# Patient Record
Sex: Male | Born: 1945
Health system: Southern US, Community
[De-identification: ages and names within clinical notes are randomized; demographics above are authoritative.]

## PROBLEM LIST (undated history)

## (undated) DIAGNOSIS — C801 Malignant (primary) neoplasm, unspecified: Secondary | ICD-10-CM

## (undated) DIAGNOSIS — Z860100 Personal history of colon polyps, unspecified: Secondary | ICD-10-CM

## (undated) DIAGNOSIS — J449 Chronic obstructive pulmonary disease, unspecified: Secondary | ICD-10-CM

## (undated) DIAGNOSIS — I219 Acute myocardial infarction, unspecified: Secondary | ICD-10-CM

## (undated) DIAGNOSIS — I779 Disorder of arteries and arterioles, unspecified: Secondary | ICD-10-CM

## (undated) DIAGNOSIS — I4819 Other persistent atrial fibrillation: Secondary | ICD-10-CM

## (undated) DIAGNOSIS — G8929 Other chronic pain: Secondary | ICD-10-CM

## (undated) DIAGNOSIS — F32A Depression, unspecified: Secondary | ICD-10-CM

## (undated) DIAGNOSIS — Z9001 Acquired absence of eye: Secondary | ICD-10-CM

## (undated) DIAGNOSIS — M5104 Intervertebral disc disorders with myelopathy, thoracic region: Secondary | ICD-10-CM

## (undated) DIAGNOSIS — H9190 Unspecified hearing loss, unspecified ear: Secondary | ICD-10-CM

## (undated) DIAGNOSIS — M549 Dorsalgia, unspecified: Secondary | ICD-10-CM

## (undated) DIAGNOSIS — J849 Interstitial pulmonary disease, unspecified: Secondary | ICD-10-CM

## (undated) DIAGNOSIS — I10 Essential (primary) hypertension: Secondary | ICD-10-CM

## (undated) DIAGNOSIS — R911 Solitary pulmonary nodule: Secondary | ICD-10-CM

## (undated) DIAGNOSIS — I4891 Unspecified atrial fibrillation: Secondary | ICD-10-CM

## (undated) DIAGNOSIS — R0600 Dyspnea, unspecified: Secondary | ICD-10-CM

## (undated) DIAGNOSIS — I5032 Chronic diastolic (congestive) heart failure: Secondary | ICD-10-CM

## (undated) DIAGNOSIS — C679 Malignant neoplasm of bladder, unspecified: Secondary | ICD-10-CM

## (undated) DIAGNOSIS — D649 Anemia, unspecified: Secondary | ICD-10-CM

## (undated) DIAGNOSIS — R0989 Other specified symptoms and signs involving the circulatory and respiratory systems: Secondary | ICD-10-CM

## (undated) DIAGNOSIS — E785 Hyperlipidemia, unspecified: Secondary | ICD-10-CM

## (undated) DIAGNOSIS — I499 Cardiac arrhythmia, unspecified: Secondary | ICD-10-CM

## (undated) DIAGNOSIS — M1990 Unspecified osteoarthritis, unspecified site: Secondary | ICD-10-CM

## (undated) DIAGNOSIS — I2510 Atherosclerotic heart disease of native coronary artery without angina pectoris: Secondary | ICD-10-CM

## (undated) DIAGNOSIS — N189 Chronic kidney disease, unspecified: Secondary | ICD-10-CM

## (undated) DIAGNOSIS — E119 Type 2 diabetes mellitus without complications: Secondary | ICD-10-CM

## (undated) DIAGNOSIS — N186 End stage renal disease: Secondary | ICD-10-CM

## (undated) DIAGNOSIS — Z86718 Personal history of other venous thrombosis and embolism: Secondary | ICD-10-CM

## (undated) DIAGNOSIS — I739 Peripheral vascular disease, unspecified: Secondary | ICD-10-CM

## (undated) HISTORY — DX: Malignant (primary) neoplasm, unspecified: C80.1

## (undated) HISTORY — PX: HERNIA REPAIR: SHX51

## (undated) HISTORY — DX: Personal history of colonic polyps: Z86.010

## (undated) HISTORY — DX: Essential (primary) hypertension: I10

## (undated) HISTORY — DX: Chronic obstructive pulmonary disease, unspecified: J44.9

## (undated) HISTORY — PX: COLON RESECTION: SHX5231

## (undated) HISTORY — DX: Unspecified osteoarthritis, unspecified site: M19.90

## (undated) HISTORY — DX: Unspecified atrial fibrillation: I48.91

## (undated) HISTORY — DX: Hyperlipidemia, unspecified: E78.5

## (undated) HISTORY — DX: Other specified symptoms and signs involving the circulatory and respiratory systems: R09.89

## (undated) HISTORY — PX: BACK SURGERY: SHX140

## (undated) HISTORY — PX: EYE SURGERY: SHX253

## (undated) HISTORY — DX: Atherosclerotic heart disease of native coronary artery without angina pectoris: I25.10

## (undated) HISTORY — DX: Other chronic pain: G89.29

## (undated) HISTORY — DX: Personal history of colon polyps, unspecified: Z86.0100

## (undated) HISTORY — DX: Intervertebral disc disorders with myelopathy, thoracic region: M51.04

## (undated) HISTORY — DX: Dorsalgia, unspecified: M54.9

## (undated) HISTORY — DX: Peripheral vascular disease, unspecified: I73.9

## (undated) HISTORY — DX: Interstitial pulmonary disease, unspecified: J84.9

## (undated) HISTORY — DX: Acquired absence of eye: Z90.01

---

## 1997-11-29 ENCOUNTER — Ambulatory Visit (HOSPITAL_COMMUNITY): Admission: RE | Admit: 1997-11-29 | Discharge: 1997-11-29 | Payer: Self-pay | Admitting: Neurosurgery

## 1998-09-19 ENCOUNTER — Encounter: Payer: Self-pay | Admitting: *Deleted

## 1998-09-20 ENCOUNTER — Observation Stay (HOSPITAL_COMMUNITY): Admission: RE | Admit: 1998-09-20 | Discharge: 1998-09-21 | Payer: Self-pay | Admitting: *Deleted

## 1999-05-02 ENCOUNTER — Observation Stay (HOSPITAL_COMMUNITY): Admission: EM | Admit: 1999-05-02 | Discharge: 1999-05-03 | Payer: Self-pay | Admitting: Pediatrics

## 1999-05-02 ENCOUNTER — Encounter (INDEPENDENT_AMBULATORY_CARE_PROVIDER_SITE_OTHER): Payer: Self-pay | Admitting: Specialist

## 1999-10-22 ENCOUNTER — Encounter: Payer: Self-pay | Admitting: *Deleted

## 1999-10-24 ENCOUNTER — Encounter (INDEPENDENT_AMBULATORY_CARE_PROVIDER_SITE_OTHER): Payer: Self-pay | Admitting: Specialist

## 1999-10-24 ENCOUNTER — Ambulatory Visit (HOSPITAL_COMMUNITY): Admission: RE | Admit: 1999-10-24 | Discharge: 1999-10-24 | Payer: Self-pay | Admitting: *Deleted

## 1999-10-24 HISTORY — PX: TRANSURETHRAL RESECTION OF BLADDER TUMOR: SHX2575

## 2000-11-04 ENCOUNTER — Ambulatory Visit (HOSPITAL_COMMUNITY): Admission: RE | Admit: 2000-11-04 | Discharge: 2000-11-04 | Payer: Self-pay | Admitting: Family Medicine

## 2000-11-04 ENCOUNTER — Encounter: Payer: Self-pay | Admitting: Family Medicine

## 2002-08-21 ENCOUNTER — Ambulatory Visit (HOSPITAL_COMMUNITY): Admission: RE | Admit: 2002-08-21 | Discharge: 2002-08-21 | Payer: Self-pay | Admitting: Orthopaedic Surgery

## 2002-08-21 HISTORY — PX: OTHER SURGICAL HISTORY: SHX169

## 2004-11-19 ENCOUNTER — Ambulatory Visit: Payer: Self-pay | Admitting: Internal Medicine

## 2004-11-28 ENCOUNTER — Ambulatory Visit: Payer: Self-pay | Admitting: Internal Medicine

## 2004-11-28 ENCOUNTER — Ambulatory Visit: Payer: Self-pay

## 2004-12-09 ENCOUNTER — Ambulatory Visit: Payer: Self-pay | Admitting: Internal Medicine

## 2004-12-12 ENCOUNTER — Inpatient Hospital Stay (HOSPITAL_BASED_OUTPATIENT_CLINIC_OR_DEPARTMENT_OTHER): Admission: RE | Admit: 2004-12-12 | Discharge: 2004-12-12 | Payer: Self-pay | Admitting: Internal Medicine

## 2004-12-12 ENCOUNTER — Ambulatory Visit: Payer: Self-pay | Admitting: Internal Medicine

## 2004-12-19 ENCOUNTER — Ambulatory Visit: Payer: Self-pay | Admitting: Cardiology

## 2004-12-26 ENCOUNTER — Inpatient Hospital Stay (HOSPITAL_COMMUNITY): Admission: RE | Admit: 2004-12-26 | Discharge: 2004-12-29 | Payer: Self-pay | Admitting: Vascular Surgery

## 2004-12-26 HISTORY — PX: OTHER SURGICAL HISTORY: SHX169

## 2005-01-21 ENCOUNTER — Inpatient Hospital Stay (HOSPITAL_COMMUNITY): Admission: RE | Admit: 2005-01-21 | Discharge: 2005-01-24 | Payer: Self-pay | Admitting: Vascular Surgery

## 2005-01-21 HISTORY — PX: OTHER SURGICAL HISTORY: SHX169

## 2005-02-24 ENCOUNTER — Encounter: Admission: RE | Admit: 2005-02-24 | Discharge: 2005-02-24 | Payer: Self-pay | Admitting: Vascular Surgery

## 2005-02-26 ENCOUNTER — Ambulatory Visit: Payer: Self-pay | Admitting: Internal Medicine

## 2005-03-05 ENCOUNTER — Ambulatory Visit: Payer: Self-pay | Admitting: Internal Medicine

## 2005-04-28 ENCOUNTER — Ambulatory Visit: Payer: Self-pay | Admitting: Internal Medicine

## 2005-05-18 ENCOUNTER — Ambulatory Visit: Payer: Self-pay | Admitting: Internal Medicine

## 2005-07-06 ENCOUNTER — Ambulatory Visit: Payer: Self-pay | Admitting: Internal Medicine

## 2005-07-10 ENCOUNTER — Ambulatory Visit: Payer: Self-pay | Admitting: Internal Medicine

## 2005-07-16 ENCOUNTER — Ambulatory Visit: Payer: Self-pay | Admitting: Cardiology

## 2005-07-16 ENCOUNTER — Ambulatory Visit (HOSPITAL_COMMUNITY): Admission: RE | Admit: 2005-07-16 | Discharge: 2005-07-17 | Payer: Self-pay | Admitting: Cardiology

## 2005-08-06 ENCOUNTER — Ambulatory Visit: Payer: Self-pay | Admitting: Internal Medicine

## 2005-09-11 ENCOUNTER — Ambulatory Visit: Payer: Self-pay | Admitting: Internal Medicine

## 2008-10-05 DIAGNOSIS — M549 Dorsalgia, unspecified: Secondary | ICD-10-CM | POA: Insufficient documentation

## 2008-10-05 DIAGNOSIS — Z9189 Other specified personal risk factors, not elsewhere classified: Secondary | ICD-10-CM

## 2008-10-05 DIAGNOSIS — I10 Essential (primary) hypertension: Secondary | ICD-10-CM

## 2008-10-08 ENCOUNTER — Encounter: Payer: Self-pay | Admitting: Internal Medicine

## 2008-10-08 ENCOUNTER — Ambulatory Visit: Payer: Self-pay | Admitting: Internal Medicine

## 2008-10-08 DIAGNOSIS — R0989 Other specified symptoms and signs involving the circulatory and respiratory systems: Secondary | ICD-10-CM

## 2008-10-08 DIAGNOSIS — I2510 Atherosclerotic heart disease of native coronary artery without angina pectoris: Secondary | ICD-10-CM

## 2008-10-08 DIAGNOSIS — E785 Hyperlipidemia, unspecified: Secondary | ICD-10-CM | POA: Insufficient documentation

## 2008-10-08 DIAGNOSIS — I10 Essential (primary) hypertension: Secondary | ICD-10-CM

## 2008-10-08 DIAGNOSIS — F17200 Nicotine dependence, unspecified, uncomplicated: Secondary | ICD-10-CM | POA: Insufficient documentation

## 2008-10-08 DIAGNOSIS — I739 Peripheral vascular disease, unspecified: Secondary | ICD-10-CM

## 2008-10-08 HISTORY — DX: Other specified symptoms and signs involving the circulatory and respiratory systems: R09.89

## 2008-10-22 ENCOUNTER — Encounter: Payer: Self-pay | Admitting: Internal Medicine

## 2008-10-22 ENCOUNTER — Ambulatory Visit: Payer: Self-pay

## 2009-01-24 ENCOUNTER — Ambulatory Visit: Payer: Self-pay | Admitting: Internal Medicine

## 2009-01-24 DIAGNOSIS — N529 Male erectile dysfunction, unspecified: Secondary | ICD-10-CM

## 2009-01-24 HISTORY — DX: Male erectile dysfunction, unspecified: N52.9

## 2009-10-30 ENCOUNTER — Encounter: Payer: Self-pay | Admitting: Internal Medicine

## 2009-10-31 ENCOUNTER — Ambulatory Visit: Payer: Self-pay

## 2009-10-31 ENCOUNTER — Ambulatory Visit: Payer: Self-pay | Admitting: Internal Medicine

## 2009-10-31 DIAGNOSIS — I6529 Occlusion and stenosis of unspecified carotid artery: Secondary | ICD-10-CM | POA: Insufficient documentation

## 2010-07-31 NOTE — Miscellaneous (Signed)
Summary: Orders Update  Clinical Lists Changes  Orders: Added new Test order of Carotid Duplex (Carotid Duplex) - Signed 
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
## 2010-08-28 ENCOUNTER — Telehealth (INDEPENDENT_AMBULATORY_CARE_PROVIDER_SITE_OTHER): Payer: Self-pay | Admitting: *Deleted

## 2010-09-01 ENCOUNTER — Encounter: Payer: Self-pay | Admitting: Internal Medicine

## 2010-09-04 NOTE — Progress Notes (Addendum)
  Walk in Patient Form Recieved " Pt need surgical Clearance paper dropped from [facility]" sent to Message Nurse [facility]  August 28, 2010 2:48 PM     Appended Document:  will have Dr [name] review on Mon. 3/5  Appended Document:  pt cleared for surgery ok to hold plavix, note faxed pt is aware

## 2010-09-16 NOTE — Letter (Signed)
Summary: [facility]   Imported By: Marilynne Drivers 09/09/2010 15:36:41  _____________________________________________________________________  External Attachment:    Type:   Image     Comment:   External Document

## 2010-11-14 NOTE — Op Note (Signed)
[facility]  Patient:    [name], [name]                        MRN: [id number] Proc. Date: 10/24/99 Adm. Date:  [id number] Disc. Date: [id number] Attending:  Larene Beach CCJanit Pagan., M.D.                           Operative Report  PREOPERATIVE DIAGNOSIS: 1. 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 2. Colovesical fistula 1994. 3. Diverticulosis of the colon and diverticulitis. 4. Cigarette and alcohol abuse. 5. BCG 1994.  POSTOPERATIVE DIAGNOSIS: 1. 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 2. Colovesical fistula 1994. 3. Diverticulosis of the colon and diverticulitis. 4. Cigarette and alcohol abuse. 5. BCG 1994.  OPERATION PERFORMED:  Transurethral resection of bladder tumor.  SURGEON:  Janit Pagan., M.D.  ANESTHESIA:  DESCRIPTION OF PROCEDURE:  This 65 year old male was brought to the operating room, underwent successful induction of general anesthesia, was prepped and draped in the lithotomy position after receiving IV gentamicin.  The patients urethra was dilated through #32 [place] with ease and the #28 Olympus resectoscope was introduced and the bladder inspected with 70 and 12 degree lenses.  The patient had three small tumors in the dome of the bladder, one near the air bubble, one just to the right at the junction with the right and lateral wall and one on the posterior dome just behind the air bubble.  Attempts to reach these tumors with a cold biopsy forceps were unsuccessful but a small biopsy was taken with the resectoscope loop but was unfortunately rather badly coagulated.  The other smaller tumors were simply coagulated with the resectoscope loop, the largest of these tumors was about 5 to 8 mm in diameter.  The smallest was in the neighborhood of 3 to 4 mm.  The  bladder was again inspected, no additional tumors could be identified.   No stones were noted, right and left ureteral orifices were normal and undamaged.  The bladder outlet showed a mild median bar formation but he did have kissing lateral lobes from 2 to 3 cm.  There were no tumors or strictures in the urethra.  The bladder was drained with a #18 Foley catheter.  The plan is for the patient to be sent home with Darvocet-N 100 for pain, Xanax  1 mg q.i.d. for nerves.  Will give him cefalexin 100 mg three times a day. Have him return to the office next week. DD:  10/24/99 TD:  10/27/99 Job: [id number]

## 2010-11-14 NOTE — Op Note (Signed)
[name], [name] NO.:  [id number]   MEDICAL RECORD NO.:  [id number]          PATIENT TYPE:  INP   LOCATION:  2899                         FACILITY:  [facility]   PHYSICIAN:  Nelda Severe. Kellie Simmering, M.D.  DATE OF BIRTH:  09/21/1945   DATE OF PROCEDURE:  12/26/2004  DATE OF DISCHARGE:                                 OPERATIVE REPORT   PREOPERATIVE DIAGNOSIS:  Severe claudication of both lower extremities  secondary to aortic occlusion   POSTOPERATIVE DIAGNOSIS:  Severe claudication of both lower extremities  secondary to aortic occlusion   OPERATIONS:  1.  Left axillary to common femoral bypass using an 8 mm Hemashield Dacron      graft.  2.  Left femoral to right femoral bypass using an 8 mm Hemashield Dacron      graft.   SURGEON:  Nelda Severe. Kellie Simmering, M.D.   FIRST ASSISTANT:  Jacinta Shoe, P.A.   ANESTHESIA:  General endotracheal.   DESCRIPTION OF PROCEDURE:  The patient was taken to the operating room and  placed in the supine position, at which time satisfactory general  endotracheal anesthesia was administered. The left neck, upper chest, the  lower abdomen and both groins were prepped with Betadine scrub and solution  and draped in the routine sterile manner. An infraclavicular incision was  made on the left side and carried down through subcutaneous tissue.  Dissecting along the fibers of the pectoralis major muscle, the axillary  artery was exposed, being careful to look avoid injury to the brachial  plexus. The artery was encircled with vessel loops. It was a normal-  appearing vessel which had an excellent pulse. Longitudinal incisions were  made in both inguinal regions and the common superficial and profunda  femoris arteries dissected free and encircled with vessel loops. Very weak  pulses were palpable on both sides (approximately 1+). Both common femoral  arteries were soft and relatively normal in appearance with soft superficial  femoral and  profunda femoris arteries as well. A tunnel was then created on  the left lateral chest and abdominal wall at the mid axillary line and an 8  mm Hemashield Dacron graft delivered through the tunnel and a second graft  was delivered through a suprapubic tunnel between the right and left  inguinal incisions. The patient was heparinized. The axillary artery was  occluded proximally and distally with vascular clamps. Longitudinal opening  made with a 15 blade and extended with the Potts scissors. The 8 mm graft  was anastomosed end to side with 6-0 Prolene. Following this, an end-to-side  anastomosis was done distally to the left common femoral artery after  spatulating the graft appropriately and this was done with 5-0 Prolene. The  left to right femoral crossover graft was then anastomosed to the distal end  of the left axillofemoral graft after making an opening with an 11 blade and  enlarging with a 5 mm punch. This was done with 6-0 Prolene. The right limb  of the femoral-femoral graft was then anastomosed end to side to the common  femoral artery with 5-0 Prolene  after spatulating the graft appropriately.  All of these vessels appeared relatively normal. Clamps were then opened  with excellent pulses bilaterally. No  significant hypotension occurred. Protamine was given to reverse the  heparin. Following adequate hemostasis, the wounds were closed in layers  with Vicryl in a subcuticular fashion. Sterile dressing applied. The patient  was taken to the recovery room in satisfactory condition.       JDL/MEDQ  D:  12/26/2004  T:  12/26/2004  Job:  [id number]

## 2010-11-14 NOTE — Cardiovascular Report (Signed)
[name], [name] NO.:  [id number]   MEDICAL RECORD NO.:  [id number]          PATIENT TYPE:  OIB   LOCATION:  6501                         FACILITY:  [facility]   PHYSICIAN:  Glori Bickers, M.D. LHCDATE OF BIRTH:  [birth year]   DATE OF PROCEDURE:  12/12/2004  DATE OF DISCHARGE:                              CARDIAC CATHETERIZATION   PRIMARY CARE PHYSICIAN:  Edd Fabian, M.D., at [facility].   PRIMARY CARDIOLOGIST:  Glori Bickers, M.D.   PROCEDURES PERFORMED:  1.  Selective coronary angiography.  2.  Left heart catheterization.  3.  Left ventriculogram.  4.  Abdominal aortogram.  5.  Right subclavian angiography.   CARDIOLOGIST:  Glori Bickers, M.D.   PATIENT IDENTIFICATION:  [name] is a 65 year old male with multiple  cardiac risk factors including hypertension, hyperlipidemia and COPD with a  history of very heavy tobacco use.  I initially saw him several weeks in the  office after referral for chest pain.  Adenosine Myoview on November 28, 2004  showed an ejection fraction of 56% with evidence of ischemia in the inferior  wall.  He also has a history of significant back pain and in pressing him  more about this he also describes claudication with lower extremity rest  pain especially in the right lower extremity.  He was thus referred for a  left heart catheterization to evaluate his coronary anatomy.   DESCRIPTION OF THE PROCEDURE:  The risks and benefits of the catheterization  were explained to [name] and his daughter, consent was signed and placed  in the chart.   Initially we selected a right femoral artery approach.  A 4 French arterial  sheath was placed in the right femoral artery using the modified Seldinger  technique.  Although we were able to insert the sheath we were unable to get  the wire up into the central aorta.  Hand injection through a JR-4 catheter  showed what appeared to be a total occlusion of the  abdominal aorta.  Thus  this approach was abandoned and the right brachial area was prepped and  draped in a routine sterile fashion.  A 6 French sheath was placed in the  artery using the modified Seldinger technique.  Three-thousand units of  heparin was then given for anticoagulation.   The right subclavian was markedly tortuous; however, we were eventually able  to pass a wire into the ascending aorta.  A 4 [place] JR-4 was used to image  the right coronary artery.  A 6 family history Castillo-2 was used for the  left system.  We used a 4 French bent pigtail for all the powered  injections.   FINDINGS:   HEMODYNAMIC DATA:  Central aortic pressure was 150/78 wit a mean of 105.  LV  was 166/8/12.  There was no gradient on aortic valve pullback.   ANGIOGRAPHIC DATA:  Left Main:  The left main was normal.   LAD:  The LAD was a moderate-sized vessel.  The mid LAD after the takeoff of  a small-too-normal-sized septal the LAD appeared to take on a very  tortuous  bend.  initially, I thought this looked like the LAD may be a stump and just  giving off a septal; however, on further imaging it appeared that the LAD is  just markedly tortuous,  but patent.  There no evidence of collaterals from  the right to support a total occlusion in the face of normal anterior wall  motion on left ventriculogram.  In the proximal and mid LAD there is not  significant coronary disease.  In the distal LAD there is some mild diffuse  plaquing.  There is also a large septal branch, which is free of critical  disease.   Left Circumflex:  The left circumflex is a moderate-sized system.  It gives  off a moderate-sized ramus intermedius, a moderate-sized OM-1 and a small OM-  2.  There is a 30% proximal lesion in the ramus.   Right Coronary Artery:  The right coronary artery is a moderate-sized  dominant vessel.  It gives off a branching PDA and two small-to-normal-sized  posterolaterals.  There is a 30%  stenosis proximally and then tandem 80%  lesions around the first bend and into the midsection.  There was a 40% mid  lesion and a 30% lesion in the distal RCA prior to the takeoff of the PDA.   VENTRICULOGRAPHIC DATA:  Left Ventriculogram done in the RAO approach had an  EF of 60% with no significant wall motion abnormalities or mitral  regurgitation.   Power injection of the distal aorta showed totally occluded distal aorta  after the takeoff of the renals.  The renal arteries appear patent  bilaterally.   ASSESSMENT:  1.  Significant coronary artery disease in the right coronary artery, which      is stable with resolution of chest pain with the addition of oral      nitroglycerin.  2.  Normal left ventricular function.  3.  Severe peripheral vascular disease with totally occluded abdominal aorta      with lower extremity rest pain.   PLAN:  The plan will be:  1.  CVTS consult for aortobifemoral bypass.  2.  Given his stable coronary disease we will treat him aggressively with      beta blockers and nitroglycerin preoperatively.  3.  The patient will need percutaneous intervention on his right coronary in      the future; however, given the need for Plavix after his stent we will      delay this until after his aortobifemoral bypass.   Certainly he will be a slightly high risk for cardiovascular complications  in the light of his right coronary disease, but with appropriate nitrates  and  beta blocker we should be able to minimize his risks.  The recent Carp  data would support this approach.   The cine films and clinical scenario were reviewed with Dr. Albertine Patricia who  agreed that given his lower extremity rest pain that lower extremity  revascularization takes precedence over his coronary disease at this point.       DB/MEDQ  D:  12/12/2004  T:  12/13/2004  Job:  [id number]   cc:   Edd Fabian, M.D.  [facility]
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
## 2010-11-14 NOTE — Op Note (Signed)
NAMEJOANN, [name] NO.:  [id number]   MEDICAL RECORD NO.:  [id number]          PATIENT TYPE:  INP   LOCATION:  2899                         FACILITY:  [facility]   PHYSICIAN:  Nelda Severe. Kellie Simmering, M.D.  DATE OF BIRTH:  1946/05/07   DATE OF PROCEDURE:  01/21/2005  DATE OF DISCHARGE:                                 OPERATIVE REPORT   PREOPERATIVE DIAGNOSIS:  Thrombosed left axillofemoral and left-to-right  femoral-femoral bypass grafts one month post insertion.   POSTOPERATIVE DIAGNOSIS:  Thrombosed left axillofemoral and left-to-right  femoral-femoral bypass grafts one month post insertion secondary to seroma  or lymphocele surrounding Dacron graft - noninfected   OPERATIONS:  Removal of left axillofemoral and left-to-right femoral-femoral  Dacron bypass with insertion of a new left axillofemoral and left to right  femoral-femoral bypass using a 6-mm ringed Gore-Tex graft.   SURGEON:  Dr. Kellie Simmering.   FIRST ASSISTANT:  John Giovanni, P.A.-C.   ANESTHESIA:  General endotracheal.   PROCEDURE:  The patient was taken to the operating room, placed in a supine  position at which time satisfactory general endotracheal anesthesia was  administered. The left upper chest, lower abdomen and both groins were  prepped Betadine scrub solution and draped in routine sterile manner.  Initially, incision was made in the left inguinal region, carried down  through subcutaneous tissue and the anastomoses in the left femoral artery  were exposed. The Dacron graft from the left axillary artery down to the  left common femoral artery was identified, and there was some thick slimy  but not purulent serous type fluid consistent with a chronic lymphocele feel  surrounding the graft. The graft was not incorporated either toward the  axillary area or across the suprapubic area, and there was a biofilm over  the graft. It was felt that this represented a seroma or chronic lymphocele.  Cultures  were sent, both aerobic and anaerobic, and Gram stain was sent to  lab, and this revealed no organisms but some white blood cells. It was  decided to remove this graft and replace it with a different graft material  (Gore-Tex). The three incisions, both inguinal incisions and the left  infraclavicular incision, were reopened by making incisions through the  previous scars. The Dacron was exposed down to the anastomosis to the  axillary artery in the proximal wound, and the Dacron anastomoses to the  common femoral arteries were exposed bilaterally with proximal distal  control. Then, 6,000 to 7,000 units of heparin given intravenously. Graft  was occluded at the femoral anastomoses bilaterally and transected, and the  entire graft was removed leaving a small stump of graft on the axillary  artery proximally. There were dense adhesions in this area, and it was not  felt safe to dissect the artery away from the brachial plexus. Following  this, an 8-mm ringed Gore-Tex graft was tunneled through a totally separate  tunnel more superficially along the mid axillary line, and a second  suprapubic tunnel was created with the same graft material utilized, and  this was also tunneled more superficially in an different  tunnel. Following  this, the Gore-Tex was anastomosed end-to-end to the Dacron, leaving about 2  cm of Dacron on the axillary artery, and this was done with 6-0 Prolene. The  Dacron was then completely excised from the left common femoral artery, and  an end-to-side anastomosis was done, Gore-Tex to common femoral artery, with  6-0 Prolene. An opening was then made just proximal to the hood of the  anastomosis with 11 blade and a large 5-mm punch, and the left-to-right  femoral-femoral graft was anastomosed with 6-0 Prolene. The Dacron was  totally excised from right common femoral artery, and the Gore-Tex was then  spatulated and anastomosed end-to-side with 6-0 Prolene. Clamp was  then  released after appropriate flushing, and there was excellent pulse in all  vessels and good dorsalis pedis pulses in both feet. Protamine was given to  reverse the heparin. Following adequate hemostasis, thorough irrigation  through the old tunnels was performed with regular saline, and there was no  evidence of any purulence throughout this area. Wounds were closed in layers  with Vicryl in a subcuticular fashion. Sterile dressing applied. The patient  taken to recovery in satisfactory condition.       JDL/MEDQ  D:  01/21/2005  T:  01/21/2005  Job:  [id number]

## 2010-11-14 NOTE — Op Note (Signed)
[name], [name]                           ACCOUNT NO.:  [id number]   MEDICAL RECORD NO.:  [id number]                   PATIENT TYPE:  OIB   LOCATION:  2899                                 FACILITY:  [facility]   PHYSICIAN:  Mark C. Lorin Mercy, M.D.                 DATE OF BIRTH:  06/24/1946   DATE OF PROCEDURE:  08/21/2002  DATE OF DISCHARGE:  08/21/2002                                 OPERATIVE REPORT   PREOPERATIVE DIAGNOSES:  1. Long head of biceps rupture.  2. Labral tear.  3. Full-thickness rotator cuff tear.   PROCEDURES:  1. Right shoulder arthroscopy.  2. Debridement of long head biceps tendon tear.  3. Debridement of extensive labral tear.  4. Partial synovectomy and open rotator cuff repair with open acromioplasty.  5. Chondral debridement.   SURGEON:  Mark C. Lorin Mercy, M.D.   ANESTHESIA:  GOT plus scalene block.   ESTIMATED BLOOD LOSS:  100 mL.   DESCRIPTION OF PROCEDURE:  After induction of general anesthesia,  orotracheal intubation, the patient was placed in the beach chair position  with preoperative antibiotic prophylaxis.  Standard prepping of the shoulder  was performed and the usual arthroscopic sheets and drapes were applied.  Impervious stockinette and Coban was applied and the arthroscopic pouch.  The scope was introduced from a posterior portal.  The anterior portal was  made with an in-out technique and a cannula was placed anteriorly.  There  was extensive synovitis present.  There was a type 2 tear of the labrum  present, which was subluxing int the joint, and a small remnant of the  biceps tendon, which was about 80% torn.  The patient had distal migration  of the biceps muscle and the remaining portion was debrided as well as the  superior portion of the labrum, trimming it back to a stable junction.  Inspection of the undersurface of the rotator cuff showed a tear of the  supraspinatus tendon.  The subscapularis and posterior rotator cuff was  intact.   There was some chondromalacia, which was debrided in the humeral  head as well as the glenoid surface in the midportion.  Shoulder resection  was primed, the instruments were removed.  An incision was made in line with  the anterior aspect of the acromion.  The Bovie was used to peel off the  deltoid off the anterior aspect.  A Cobb was placed underneath the acromion,  and the large anterior acromial spur was removed with a three-quarter  straight osteotome and then smoothed with a rasp.  The distal aspect of the  clavicle was not prominent, and the coracoacromial ligament was taken with  the bone spur fragment.  A small bleeder in the ligament was coagulated with  the cautery.  There was extensive bursal tissue, which was resected.  Arthroscopic fluid was extravasating out of the full-thickness rotator cuff  tear, which was  2 x 1 cm.  The cuff was cut back to 10 mm of good tissue.  Bone was freshened up with the rongeur until the Fix anchors were placed.  Horizontal mattress was used to reapproximating the template of the rotator  cuff back down to bone, where it had been freshened up and was bleeding.  The shoulder was rotated.  There was no further tearing.  The undersurface  of the acromion was smooth.  The deltoid was repaired through drill holes  back to the acromion with 2-0 Vicryl in the subcutaneous tissue and  skin staple closure as well as the two arthroscopic portals.  No Marcaine  was infiltrated as the patient had had the preoperative scalene block.  A  postop dressing, shoulder immobilizer, and then transfer from the recovery  room home as an outpatient procedure.  Office follow-up one week.                                               Mark C. Lorin Mercy, M.D.    MCY/MEDQ  D:  08/21/2002  T:  08/22/2002  Job:  [id number]
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
## 2010-11-14 NOTE — Cardiovascular Report (Signed)
[name], [name] NO.:  [id number]   MEDICAL RECORD NO.:  [id number]          PATIENT TYPE:  OIB   LOCATION:  6523                         FACILITY:  [facility]   PHYSICIAN:  Ethelle Lyon, M.D. LHCDATE OF BIRTH:  08/22/1945   DATE OF PROCEDURE:  07/16/2005  DATE OF DISCHARGE:                              CARDIAC CATHETERIZATION   PROCEDURE:  Left heart catheterization, left ventriculography, coronary  angiography, bare-metal stenting of the proximal RCA from brachial access.   INDICATIONS:  [name] is a 65 year old gentleman with coronary peripheral  vascular disease and is status post a left axillofemoral bypass graft and  left-to-right femoral-femoral bypass graft by Dr. Kellie Simmering. He underwent  cardiac catheterization in June 2006 demonstrating 80% stenosis of the RCA.  At that time, medical therapy was recommended. He has since undergone the  peripheral vascular procedure and has unfortunately continued to have class  III stable angina despite maximal medical therapy. Dr. [name] therefore  referred him for repeat angiography with percutaneous revascularization.   PROCEDURE TECHNIQUE:  Informed consent was obtained. The patient consented  to participate in the [place] study of anticoagulation in the setting of  percutaneous revascularization. Under 1% lidocaine local anesthesia and  using a micropuncture technique, a 6-French sheath was placed in the right  brachial artery. Diagnostic angiography and ventriculography was then  performed using JL-4, JR-4, pigtail catheters. This demonstrated stable  disease with continued 80% stenosis of the proximal and mid RCA and no  significant disease on the left. Left ventricular systolic function remains  normal. I then decided to proceed to percutaneous revascularization of the  right coronary.   Anticoagulation was initiated with bivalirudin. ACT was confirmed to be  greater than 225 seconds. The patient had  been maintained on aspirin and  Plavix prior to coming out of the hospital. [name] study drugs were  administered. A 6-French ART guide was advanced over a wire and engaged in  the ostium of the RCA. A Prowater wire was advanced to the distal vessel  without difficulty. The lesion was predilated using a 3.0 x 20 mm Maverick  at 6 atmospheres. It was then stented using a 3.5 x 32 mm Liberte stent  deployed to 16 atmospheres. Stent was then postdilated using a 3.75 mm  PowerSail at 16 atmospheres. Final angiography demonstrated no residual  stenosis, no dissection and TIMI III flow to the distal vasculature. He was  then transferred to holding room in stable condition having tolerated the  procedure well.   COMPLICATIONS:  None.   FINDINGS:  1.  LV: 142/11/16. EF 65% without regional wall motion abnormality.  2.  No aortic stenosis or mitral regurgitation.  3.  Left main: Angiographically normal.  4.  LAD:  Moderate-sized vessel giving rise to a single moderate-sized      diagonal. It is angiographically normal.  5.  Circumflex:  Moderate-sized vessel giving rise to two large obtuse      marginals. It is angiographically normal.  6.  RCA:  Moderate-sized dominant vessel. There are tandem 80% stenoses of      the  proximal and mid-RCA. These were covered with a single bare-metal      stent with no residual stenosis. There was a 40% stenosis of the distal      RCA.   IMPRESSION/PLAN:  Successful percutaneous revascularization of the right  coronary artery using a bare-metal stent. He has no other significant  coronary disease. We will plan aspirin therapy indefinitely and Plavix for  30 days. We applauded him on his smoking cessation.      Ethelle Lyon, M.D. [facility]  Electronically Signed     WED/MEDQ  D:  07/16/2005  T:  07/17/2005  Job:  [id number]   cc:   Glori Bickers, M.D. [place]  [place] [address]
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
## 2010-11-27 ENCOUNTER — Other Ambulatory Visit: Payer: Self-pay | Admitting: Cardiology

## 2010-11-27 DIAGNOSIS — I6529 Occlusion and stenosis of unspecified carotid artery: Secondary | ICD-10-CM

## 2010-11-28 ENCOUNTER — Encounter (INDEPENDENT_AMBULATORY_CARE_PROVIDER_SITE_OTHER): Payer: Medicare Other | Admitting: *Deleted

## 2010-11-28 ENCOUNTER — Other Ambulatory Visit: Payer: Self-pay | Admitting: *Deleted

## 2010-11-28 DIAGNOSIS — I6529 Occlusion and stenosis of unspecified carotid artery: Secondary | ICD-10-CM

## 2010-12-02 ENCOUNTER — Ambulatory Visit: Payer: Self-pay | Admitting: Vascular Surgery

## 2010-12-10 ENCOUNTER — Encounter: Payer: Self-pay | Admitting: Internal Medicine

## 2010-12-16 ENCOUNTER — Encounter: Payer: Self-pay | Admitting: Family Medicine

## 2011-01-27 ENCOUNTER — Ambulatory Visit (INDEPENDENT_AMBULATORY_CARE_PROVIDER_SITE_OTHER): Payer: Medicare Other | Admitting: Internal Medicine

## 2011-01-27 ENCOUNTER — Encounter: Payer: Self-pay | Admitting: Internal Medicine

## 2011-01-27 VITALS — BP 119/76 | HR 51 | Ht 71.0 in | Wt 211.0 lb

## 2011-01-27 DIAGNOSIS — I498 Other specified cardiac arrhythmias: Secondary | ICD-10-CM

## 2011-01-27 DIAGNOSIS — R001 Bradycardia, unspecified: Secondary | ICD-10-CM | POA: Insufficient documentation

## 2011-01-27 DIAGNOSIS — F17200 Nicotine dependence, unspecified, uncomplicated: Secondary | ICD-10-CM

## 2011-01-27 DIAGNOSIS — I2510 Atherosclerotic heart disease of native coronary artery without angina pectoris: Secondary | ICD-10-CM

## 2011-01-27 HISTORY — DX: Bradycardia, unspecified: R00.1

## 2011-01-27 NOTE — Assessment & Plan Note (Signed)
Counseled on need to quit.

## 2011-01-27 NOTE — Patient Instructions (Signed)
Stop Metoprolol  Your physician wants you to follow-up in: 1 year.  You will receive a reminder letter in the mail two months in advance. If you don't receive a letter, please call our office to schedule the follow-up appointment.
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
## 2011-01-27 NOTE — Assessment & Plan Note (Signed)
HR low. Asymptomatic. Will stop beta-blocker.

## 2011-01-27 NOTE — Assessment & Plan Note (Signed)
No evidence of ischemia. Continue current regimen.   

## 2011-03-19 ENCOUNTER — Ambulatory Visit
Admission: RE | Admit: 2011-03-19 | Discharge: 2011-03-19 | Disposition: A | Discharge status: Home / Self Care | Payer: Medicare Other | Source: Ambulatory Visit | Attending: Family Medicine | Admitting: Family Medicine

## 2011-03-19 ENCOUNTER — Other Ambulatory Visit: Payer: Self-pay | Admitting: Family Medicine

## 2011-03-19 DIAGNOSIS — R52 Pain, unspecified: Secondary | ICD-10-CM

## 2011-05-27 ENCOUNTER — Telehealth: Payer: Self-pay

## 2011-05-27 NOTE — Telephone Encounter (Signed)
Pt is scheduled to have a colonoscopy on 06/11/11 with Dr Benson Norway.  They want to know if he can stop his Plavix 5 days prior to the procedure?

## 2011-05-28 NOTE — Telephone Encounter (Signed)
Yes thanks 

## 2011-05-28 NOTE — Telephone Encounter (Signed)
Ashley was notified.

## 2011-11-16 DIAGNOSIS — M25569 Pain in unspecified knee: Secondary | ICD-10-CM | POA: Diagnosis not present

## 2011-11-16 DIAGNOSIS — I259 Chronic ischemic heart disease, unspecified: Secondary | ICD-10-CM | POA: Diagnosis not present

## 2011-11-16 DIAGNOSIS — I10 Essential (primary) hypertension: Secondary | ICD-10-CM | POA: Diagnosis not present

## 2011-11-16 DIAGNOSIS — E785 Hyperlipidemia, unspecified: Secondary | ICD-10-CM | POA: Diagnosis not present

## 2011-11-16 DIAGNOSIS — I2510 Atherosclerotic heart disease of native coronary artery without angina pectoris: Secondary | ICD-10-CM | POA: Diagnosis not present

## 2012-01-20 ENCOUNTER — Ambulatory Visit: Payer: Medicare Other | Admitting: Internal Medicine

## 2012-01-21 DIAGNOSIS — H2510 Age-related nuclear cataract, unspecified eye: Secondary | ICD-10-CM | POA: Diagnosis not present

## 2012-04-19 DIAGNOSIS — M25569 Pain in unspecified knee: Secondary | ICD-10-CM | POA: Diagnosis not present

## 2012-04-19 DIAGNOSIS — Z23 Encounter for immunization: Secondary | ICD-10-CM | POA: Diagnosis not present

## 2012-04-20 DIAGNOSIS — Z79899 Other long term (current) drug therapy: Secondary | ICD-10-CM | POA: Diagnosis not present

## 2012-04-20 DIAGNOSIS — E785 Hyperlipidemia, unspecified: Secondary | ICD-10-CM | POA: Diagnosis not present

## 2012-05-11 DIAGNOSIS — Z125 Encounter for screening for malignant neoplasm of prostate: Secondary | ICD-10-CM | POA: Diagnosis not present

## 2012-05-18 DIAGNOSIS — Z8551 Personal history of malignant neoplasm of bladder: Secondary | ICD-10-CM | POA: Diagnosis not present

## 2012-05-18 DIAGNOSIS — N401 Enlarged prostate with lower urinary tract symptoms: Secondary | ICD-10-CM | POA: Diagnosis not present

## 2012-10-13 ENCOUNTER — Other Ambulatory Visit: Payer: Self-pay | Admitting: Family Medicine

## 2012-10-13 MED ORDER — OXYCODONE-ACETAMINOPHEN 10-325 MG PO TABS: 1.0000 | ORAL_TABLET | Freq: Three times a day (TID) | ORAL | Status: DC | PRN

## 2012-10-13 NOTE — Telephone Encounter (Signed)
Pt valid for monthly refill.  Rx printed for provider signature.

## 2012-10-21 ENCOUNTER — Other Ambulatory Visit: Payer: Self-pay | Admitting: Family Medicine

## 2012-10-21 ENCOUNTER — Ambulatory Visit (INDEPENDENT_AMBULATORY_CARE_PROVIDER_SITE_OTHER): Payer: Medicaid Other | Admitting: Family Medicine

## 2012-10-21 ENCOUNTER — Encounter: Payer: Self-pay | Admitting: Family Medicine

## 2012-10-21 VITALS — BP 122/74 | HR 60 | Temp 97.7°F | Resp 20 | Wt 206.0 lb

## 2012-10-21 DIAGNOSIS — I10 Essential (primary) hypertension: Secondary | ICD-10-CM | POA: Diagnosis not present

## 2012-10-21 DIAGNOSIS — C44310 Basal cell carcinoma of skin of unspecified parts of face: Secondary | ICD-10-CM

## 2012-10-21 DIAGNOSIS — I2510 Atherosclerotic heart disease of native coronary artery without angina pectoris: Secondary | ICD-10-CM | POA: Diagnosis not present

## 2012-10-21 DIAGNOSIS — D485 Neoplasm of uncertain behavior of skin: Secondary | ICD-10-CM

## 2012-10-21 DIAGNOSIS — C4491 Basal cell carcinoma of skin, unspecified: Secondary | ICD-10-CM | POA: Diagnosis not present

## 2012-10-21 DIAGNOSIS — E785 Hyperlipidemia, unspecified: Secondary | ICD-10-CM

## 2012-10-21 DIAGNOSIS — M25569 Pain in unspecified knee: Secondary | ICD-10-CM | POA: Diagnosis not present

## 2012-10-21 DIAGNOSIS — D489 Neoplasm of uncertain behavior, unspecified: Secondary | ICD-10-CM

## 2012-10-21 DIAGNOSIS — M25562 Pain in left knee: Secondary | ICD-10-CM

## 2012-10-21 DIAGNOSIS — J3489 Other specified disorders of nose and nasal sinuses: Secondary | ICD-10-CM | POA: Diagnosis not present

## 2012-10-21 LAB — LIPID PANEL
HDL: 35 mg/dL — ABNORMAL LOW (ref >39)
LDL Cholesterol: 91 mg/dL (ref 0–99)
Total CHOL/HDL Ratio: 4.2 Ratio
Triglycerides: 107 mg/dL (ref <150)
VLDL: 21 mg/dL (ref 0–40)

## 2012-10-21 LAB — BASIC METABOLIC PANEL
Chloride: 100 mEq/L (ref 96–112)
Creat: 0.86 mg/dL (ref 0.50–1.35)

## 2012-10-21 LAB — HEPATIC FUNCTION PANEL
ALT: 21 U/L (ref 0–53)
Bilirubin, Direct: 0.2 mg/dL (ref 0.0–0.3)
Indirect Bilirubin: 0.6 mg/dL (ref 0.0–0.9)
Total Bilirubin: 0.8 mg/dL (ref 0.3–1.2)
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
## 2012-10-21 NOTE — Addendum Note (Signed)
Addended by: WRAY, [place] on: 10/21/2012 12:28 PM   Modules accepted: Orders

## 2012-10-24 ENCOUNTER — Other Ambulatory Visit: Payer: Self-pay | Admitting: Family Medicine

## 2012-10-24 MED ORDER — FENOFIBRATE 160 MG PO TABS: 160.0000 mg | ORAL_TABLET | Freq: Every day | ORAL | Status: DC

## 2012-10-24 NOTE — Telephone Encounter (Signed)
Rx Refilled  

## 2012-10-25 LAB — PATHOLOGY

## 2012-10-26 NOTE — Addendum Note (Signed)
Addended by: [name] on: 10/26/2012 07:40 AM   Modules accepted: Orders

## 2012-11-11 ENCOUNTER — Telehealth: Payer: Self-pay | Admitting: Family Medicine

## 2012-11-11 MED ORDER — OXYCODONE-ACETAMINOPHEN 10-325 MG PO TABS: 1.0000 | ORAL_TABLET | Freq: Three times a day (TID) | ORAL | Status: DC | PRN

## 2012-11-11 NOTE — Telephone Encounter (Signed)
Pt is aware to pick up RX

## 2012-11-11 NOTE — Telephone Encounter (Signed)
?   OK to Refill  

## 2012-11-11 NOTE — Telephone Encounter (Signed)
Percocet 10-325mg  take 1 tablet by mouth every 8hrs as needed #180 last refill 10/13/12

## 2012-11-11 NOTE — Telephone Encounter (Signed)
Patient can come pick up prescription.

## 2012-11-19 ENCOUNTER — Telehealth: Payer: Self-pay | Admitting: Family Medicine

## 2012-11-19 NOTE — Telephone Encounter (Signed)
Fenofibrate was ordered on 10/24/12 with 5 refills to soon to oreder

## 2012-11-22 ENCOUNTER — Ambulatory Visit: Payer: Medicare Other | Admitting: Internal Medicine

## 2012-12-07 ENCOUNTER — Encounter: Payer: Self-pay | Admitting: Family Medicine

## 2012-12-15 ENCOUNTER — Telehealth: Payer: Self-pay | Admitting: Family Medicine

## 2012-12-15 MED ORDER — OXYCODONE-ACETAMINOPHEN 10-325 MG PO TABS: 1.0000 | ORAL_TABLET | Freq: Three times a day (TID) | ORAL | Status: DC | PRN

## 2012-12-15 NOTE — Telephone Encounter (Signed)
..  Rx Refilled and pt aware to pu

## 2012-12-26 DIAGNOSIS — Z85828 Personal history of other malignant neoplasm of skin: Secondary | ICD-10-CM | POA: Diagnosis not present

## 2012-12-26 DIAGNOSIS — C44319 Basal cell carcinoma of skin of other parts of face: Secondary | ICD-10-CM | POA: Diagnosis not present

## 2013-01-06 ENCOUNTER — Ambulatory Visit (INDEPENDENT_AMBULATORY_CARE_PROVIDER_SITE_OTHER): Payer: Medicare Other | Admitting: Internal Medicine

## 2013-01-06 VITALS — BP 173/79 | HR 59 | Ht 70.0 in | Wt 201.0 lb

## 2013-01-06 DIAGNOSIS — E785 Hyperlipidemia, unspecified: Secondary | ICD-10-CM | POA: Diagnosis not present

## 2013-01-06 DIAGNOSIS — I2510 Atherosclerotic heart disease of native coronary artery without angina pectoris: Secondary | ICD-10-CM

## 2013-01-06 LAB — LIPID PANEL
Cholesterol: 152 mg/dL (ref 0–200)
Triglycerides: 212 mg/dL — ABNORMAL HIGH (ref 0.0–149.0)
VLDL: 42.4 mg/dL — ABNORMAL HIGH (ref 0.0–40.0)

## 2013-01-06 LAB — BASIC METABOLIC PANEL
BUN: 14 mg/dL (ref 6–23)
GFR: 97.68 mL/min (ref >60.00)
Potassium: 3.7 mEq/L (ref 3.5–5.1)

## 2013-01-06 MED ORDER — SILDENAFIL CITRATE 25 MG PO TABS: 25.0000 mg | ORAL_TABLET | Freq: Every day | ORAL | Status: DC | PRN
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
## 2013-01-06 NOTE — Patient Instructions (Addendum)
Your physician recommends that you continue on your current medications as directed. Please refer to the Current Medication list given to you today. Your physician recommends that you an BMET and LIPID and AST Your physician wants you to follow-up in: 1 year with Dr. Theressa Stamps will receive a reminder letter in the mail two months in advance. If you don't receive a letter, please call our office to schedule the follow-up appointment.

## 2013-01-09 DIAGNOSIS — D485 Neoplasm of uncertain behavior of skin: Secondary | ICD-10-CM | POA: Diagnosis not present

## 2013-01-09 DIAGNOSIS — C44319 Basal cell carcinoma of skin of other parts of face: Secondary | ICD-10-CM | POA: Diagnosis not present

## 2013-01-13 ENCOUNTER — Telehealth: Payer: Self-pay | Admitting: Family Medicine

## 2013-01-13 MED ORDER — OXYCODONE-ACETAMINOPHEN 10-325 MG PO TABS: 1.0000 | ORAL_TABLET | ORAL | Status: DC | PRN

## 2013-01-13 NOTE — Telephone Encounter (Signed)
rx printed, left up front .Patient aware

## 2013-01-17 ENCOUNTER — Encounter: Payer: Self-pay | Admitting: *Deleted

## 2013-01-19 ENCOUNTER — Telehealth: Payer: Self-pay | Admitting: *Deleted

## 2013-01-19 DIAGNOSIS — I10 Essential (primary) hypertension: Secondary | ICD-10-CM

## 2013-01-19 DIAGNOSIS — R7309 Other abnormal glucose: Secondary | ICD-10-CM

## 2013-01-19 DIAGNOSIS — Z9189 Other specified personal risk factors, not elsewhere classified: Secondary | ICD-10-CM

## 2013-01-19 DIAGNOSIS — E785 Hyperlipidemia, unspecified: Secondary | ICD-10-CM

## 2013-01-19 MED ORDER — EZETIMIBE 10 MG PO TABS: 10.0000 mg | ORAL_TABLET | Freq: Every day | ORAL | Status: DC

## 2013-01-19 NOTE — Telephone Encounter (Signed)
Patient called back to get lab results. Advised him of abnormal results with orders/advisement from MD, as follows: LDL is a little higher than goal. Trig are mildly increased. Recomm watching carbs. Zetia 10 mg po daily. F/U lipid panel Hgb A1C, AST in 2 months.  Patient agreed to plan. Will return for follow-up labwork mid-September. Patient instructed on specific diet recommendations to lower carb intake and new medication of Zetia. Patient verbalized he will notify MD office should he have any questions, problems or concerns regarding status, diet or medications.

## 2013-01-30 ENCOUNTER — Telehealth: Payer: Self-pay | Admitting: Family Medicine

## 2013-01-30 MED ORDER — HYDROCHLOROTHIAZIDE 25 MG PO TABS: 25.0000 mg | ORAL_TABLET | Freq: Every day | ORAL | Status: DC

## 2013-01-30 MED ORDER — AMLODIPINE BESYLATE 10 MG PO TABS: 10.0000 mg | ORAL_TABLET | Freq: Every day | ORAL | Status: DC

## 2013-01-30 MED ORDER — METOPROLOL SUCCINATE ER 25 MG PO TB24: 25.0000 mg | ORAL_TABLET | Freq: Every day | ORAL | Status: DC

## 2013-01-30 MED ORDER — RAMIPRIL 10 MG PO CAPS: 10.0000 mg | ORAL_CAPSULE | Freq: Two times a day (BID) | ORAL | Status: DC

## 2013-01-30 NOTE — Telephone Encounter (Signed)
HCTZ 25 mg tab 1 QD #90 Amlodipine Besylate 10 mg tab 1 QD #90 Metoprolol Succ ER 25 mg tab 1 QD #90 Ramipril 10 mg cap 1 BID #180

## 2013-01-30 NOTE — Telephone Encounter (Signed)
Rx Refilled  

## 2013-02-06 DIAGNOSIS — C44319 Basal cell carcinoma of skin of other parts of face: Secondary | ICD-10-CM | POA: Diagnosis not present

## 2013-02-06 DIAGNOSIS — Z85828 Personal history of other malignant neoplasm of skin: Secondary | ICD-10-CM | POA: Diagnosis not present

## 2013-02-10 ENCOUNTER — Telehealth: Payer: Self-pay | Admitting: Family Medicine

## 2013-02-10 MED ORDER — OXYCODONE-ACETAMINOPHEN 10-325 MG PO TABS: 1.0000 | ORAL_TABLET | ORAL | Status: DC | PRN

## 2013-02-10 NOTE — Telephone Encounter (Signed)
Percocet 10/325 mg 1 q4 hours prn

## 2013-02-10 NOTE — Telephone Encounter (Signed)
RX printed, left up front and patient aware to pick up  

## 2013-03-14 ENCOUNTER — Telehealth: Payer: Self-pay | Admitting: Family Medicine

## 2013-03-14 MED ORDER — OXYCODONE-ACETAMINOPHEN 10-325 MG PO TABS: 1.0000 | ORAL_TABLET | ORAL | Status: DC | PRN

## 2013-03-14 NOTE — Telephone Encounter (Signed)
Percocet 10-325 1 q4 hours prn

## 2013-03-14 NOTE — Telephone Encounter (Signed)
ok 

## 2013-03-14 NOTE — Telephone Encounter (Signed)
?   OK to Refill  

## 2013-03-14 NOTE — Telephone Encounter (Signed)
Percocet 10-325 mg 1 q4 hours prn

## 2013-03-14 NOTE — Telephone Encounter (Signed)
RX printed, left up front and patient aware to pick up  

## 2013-03-15 ENCOUNTER — Other Ambulatory Visit (INDEPENDENT_AMBULATORY_CARE_PROVIDER_SITE_OTHER): Payer: Medicare Other

## 2013-03-15 DIAGNOSIS — I10 Essential (primary) hypertension: Secondary | ICD-10-CM

## 2013-03-15 DIAGNOSIS — R7309 Other abnormal glucose: Secondary | ICD-10-CM

## 2013-03-15 DIAGNOSIS — E785 Hyperlipidemia, unspecified: Secondary | ICD-10-CM

## 2013-03-15 LAB — LIPID PANEL
Cholesterol: 122 mg/dL (ref 0–200)
HDL: 37.1 mg/dL — ABNORMAL LOW (ref >39.00)
LDL Cholesterol: 54 mg/dL (ref 0–99)
Total CHOL/HDL Ratio: 3
Triglycerides: 153 mg/dL — ABNORMAL HIGH (ref 0.0–149.0)
VLDL: 30.6 mg/dL (ref 0.0–40.0)

## 2013-03-24 ENCOUNTER — Encounter: Payer: Self-pay | Admitting: Family Medicine

## 2013-03-26 ENCOUNTER — Other Ambulatory Visit: Payer: Self-pay | Admitting: Family Medicine

## 2013-04-13 ENCOUNTER — Ambulatory Visit (INDEPENDENT_AMBULATORY_CARE_PROVIDER_SITE_OTHER): Payer: Medicare Other | Admitting: Family Medicine

## 2013-04-13 ENCOUNTER — Encounter: Payer: Self-pay | Admitting: Family Medicine

## 2013-04-13 VITALS — BP 140/80 | HR 70 | Temp 97.1°F | Resp 18 | Wt 200.0 lb

## 2013-04-13 DIAGNOSIS — Z23 Encounter for immunization: Secondary | ICD-10-CM | POA: Diagnosis not present

## 2013-04-13 DIAGNOSIS — M1710 Unilateral primary osteoarthritis, unspecified knee: Secondary | ICD-10-CM

## 2013-04-13 DIAGNOSIS — R7309 Other abnormal glucose: Secondary | ICD-10-CM

## 2013-04-13 DIAGNOSIS — R7303 Prediabetes: Secondary | ICD-10-CM

## 2013-04-13 DIAGNOSIS — IMO0002 Reserved for concepts with insufficient information to code with codable children: Secondary | ICD-10-CM

## 2013-04-13 DIAGNOSIS — M1712 Unilateral primary osteoarthritis, left knee: Secondary | ICD-10-CM

## 2013-04-13 MED ORDER — OXYCODONE-ACETAMINOPHEN 10-325 MG PO TABS: 1.0000 | ORAL_TABLET | ORAL | Status: DC | PRN
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
## 2013-05-15 ENCOUNTER — Other Ambulatory Visit: Payer: Self-pay | Admitting: Family Medicine

## 2013-05-15 ENCOUNTER — Telehealth: Payer: Self-pay | Admitting: Family Medicine

## 2013-05-15 MED ORDER — OXYCODONE-ACETAMINOPHEN 10-325 MG PO TABS: 1.0000 | ORAL_TABLET | ORAL | Status: DC | PRN

## 2013-05-15 NOTE — Telephone Encounter (Signed)
Patient needs his Percocet refilled

## 2013-05-15 NOTE — Telephone Encounter (Signed)
RX printed, left up front and patient aware to pick up per vm 

## 2013-05-18 ENCOUNTER — Telehealth: Payer: Self-pay | Admitting: Internal Medicine

## 2013-05-18 DIAGNOSIS — N401 Enlarged prostate with lower urinary tract symptoms: Secondary | ICD-10-CM | POA: Diagnosis not present

## 2013-05-18 DIAGNOSIS — Z8551 Personal history of malignant neoplasm of bladder: Secondary | ICD-10-CM | POA: Diagnosis not present

## 2013-05-18 DIAGNOSIS — N139 Obstructive and reflux uropathy, unspecified: Secondary | ICD-10-CM | POA: Diagnosis not present

## 2013-05-18 DIAGNOSIS — N210 Calculus in bladder: Secondary | ICD-10-CM | POA: Diagnosis not present

## 2013-05-18 NOTE — Telephone Encounter (Signed)
Taken to dr Harrington Challenger for her review

## 2013-05-18 NOTE — Telephone Encounter (Signed)
New problem    Hold plavix  7 days prior to removal of bladder stone.

## 2013-05-19 NOTE — Telephone Encounter (Signed)
Per dr Harrington Challenger, okay to hold plavix 7 days. Resume when okay from a surgical standpoint. Will fax to the number provided.

## 2013-05-22 DIAGNOSIS — N210 Calculus in bladder: Secondary | ICD-10-CM | POA: Diagnosis not present

## 2013-05-24 ENCOUNTER — Inpatient Hospital Stay (HOSPITAL_COMMUNITY): Admission: RE | Admit: 2013-05-24 | Payer: Medicare Other | Source: Ambulatory Visit

## 2013-05-29 ENCOUNTER — Ambulatory Visit (HOSPITAL_COMMUNITY): Admission: RE | Admit: 2013-05-29 | Payer: Medicare Other | Source: Ambulatory Visit | Admitting: Urology

## 2013-05-29 ENCOUNTER — Encounter (HOSPITAL_COMMUNITY): Admission: RE | Payer: Self-pay | Source: Ambulatory Visit

## 2013-05-29 SURGERY — CYSTOSCOPY, WITH BLADDER CALCULUS LITHOLAPAXY
Anesthesia: General

## 2013-06-08 ENCOUNTER — Other Ambulatory Visit: Payer: Self-pay | Admitting: Family Medicine

## 2013-06-09 DIAGNOSIS — M67919 Unspecified disorder of synovium and tendon, unspecified shoulder: Secondary | ICD-10-CM | POA: Diagnosis not present

## 2013-06-14 ENCOUNTER — Telehealth: Payer: Self-pay | Admitting: *Deleted

## 2013-06-14 NOTE — Telephone Encounter (Signed)
?   OK to Refill  

## 2013-06-15 MED ORDER — OXYCODONE-ACETAMINOPHEN 10-325 MG PO TABS: 1.0000 | ORAL_TABLET | ORAL | Status: DC | PRN

## 2013-06-15 NOTE — Telephone Encounter (Signed)
RX printed, left up front and patient aware to pick up  

## 2013-06-15 NOTE — Telephone Encounter (Signed)
ok 

## 2013-07-09 ENCOUNTER — Other Ambulatory Visit: Payer: Self-pay | Admitting: Family Medicine

## 2013-07-14 ENCOUNTER — Telehealth: Payer: Self-pay | Admitting: Family Medicine

## 2013-07-14 MED ORDER — OXYCODONE-ACETAMINOPHEN 10-325 MG PO TABS: 1.0000 | ORAL_TABLET | ORAL | Status: DC | PRN

## 2013-07-14 NOTE — Telephone Encounter (Signed)
Pt is needing his percocet refilled  Call back number is [phone]

## 2013-07-14 NOTE — Telephone Encounter (Signed)
RX printed, left up front and patient aware to pick up  

## 2013-07-15 ENCOUNTER — Other Ambulatory Visit: Payer: Self-pay | Admitting: Family Medicine

## 2013-07-24 DIAGNOSIS — M719 Bursopathy, unspecified: Secondary | ICD-10-CM | POA: Diagnosis not present

## 2013-07-24 DIAGNOSIS — M75120 Complete rotator cuff tear or rupture of unspecified shoulder, not specified as traumatic: Secondary | ICD-10-CM | POA: Diagnosis not present

## 2013-08-14 ENCOUNTER — Telehealth: Payer: Self-pay | Admitting: Family Medicine

## 2013-08-14 MED ORDER — OXYCODONE-ACETAMINOPHEN 10-325 MG PO TABS: 1.0000 | ORAL_TABLET | ORAL | Status: DC | PRN

## 2013-08-14 NOTE — Telephone Encounter (Signed)
ok 

## 2013-08-14 NOTE — Telephone Encounter (Signed)
RX printed, left up front and patient aware to pick up per vm 

## 2013-08-14 NOTE — Telephone Encounter (Signed)
Call back number is [phone] Pt is needing a refill on his percocet's

## 2013-08-14 NOTE — Telephone Encounter (Signed)
?   OK to Refill  

## 2013-08-15 ENCOUNTER — Other Ambulatory Visit: Payer: Self-pay | Admitting: Internal Medicine

## 2013-08-15 ENCOUNTER — Other Ambulatory Visit: Payer: Self-pay | Admitting: Family Medicine

## 2013-09-09 ENCOUNTER — Other Ambulatory Visit: Payer: Self-pay | Admitting: Family Medicine

## 2013-09-15 ENCOUNTER — Ambulatory Visit (INDEPENDENT_AMBULATORY_CARE_PROVIDER_SITE_OTHER): Payer: Medicare Other | Admitting: Family Medicine

## 2013-09-15 ENCOUNTER — Encounter: Payer: Self-pay | Admitting: Family Medicine

## 2013-09-15 VITALS — BP 130/72 | HR 74 | Temp 97.2°F | Resp 18 | Ht 70.0 in | Wt 203.0 lb

## 2013-09-15 DIAGNOSIS — Z79899 Other long term (current) drug therapy: Secondary | ICD-10-CM | POA: Diagnosis not present

## 2013-09-15 DIAGNOSIS — M5431 Sciatica, right side: Secondary | ICD-10-CM

## 2013-09-15 DIAGNOSIS — E785 Hyperlipidemia, unspecified: Secondary | ICD-10-CM | POA: Diagnosis not present

## 2013-09-15 DIAGNOSIS — E1059 Type 1 diabetes mellitus with other circulatory complications: Secondary | ICD-10-CM

## 2013-09-15 DIAGNOSIS — M5430 Sciatica, unspecified side: Secondary | ICD-10-CM | POA: Diagnosis not present

## 2013-09-15 DIAGNOSIS — M25569 Pain in unspecified knee: Secondary | ICD-10-CM | POA: Diagnosis not present

## 2013-09-15 DIAGNOSIS — M25562 Pain in left knee: Secondary | ICD-10-CM

## 2013-09-15 LAB — COMPREHENSIVE METABOLIC PANEL
ALBUMIN: 4.5 g/dL (ref 3.5–5.2)
ALK PHOS: 57 U/L (ref 39–117)
ALT: 15 U/L (ref 0–53)
AST: 19 U/L (ref 0–37)
BUN: 15 mg/dL (ref 6–23)
CHLORIDE: 100 meq/L (ref 96–112)
CO2: 29 mEq/L (ref 19–32)
Calcium: 9.8 mg/dL (ref 8.4–10.5)
Creat: 0.79 mg/dL (ref 0.50–1.35)
Glucose, Bld: 100 mg/dL — ABNORMAL HIGH (ref 70–99)
POTASSIUM: 4.3 meq/L (ref 3.5–5.3)
SODIUM: 139 meq/L (ref 135–145)
TOTAL PROTEIN: 7.5 g/dL (ref 6.0–8.3)
Total Bilirubin: 0.7 mg/dL (ref 0.2–1.2)

## 2013-09-15 LAB — LIPID PANEL
Cholesterol: 160 mg/dL (ref 0–200)
HDL: 46 mg/dL
LDL Cholesterol: 88 mg/dL (ref 0–99)
Total CHOL/HDL Ratio: 3.5 ratio
Triglycerides: 129 mg/dL
VLDL: 26 mg/dL (ref 0–40)

## 2013-09-15 LAB — HEMOGLOBIN A1C
Hgb A1c MFr Bld: 6 % — ABNORMAL HIGH (ref <5.7)
Mean Plasma Glucose: 126 mg/dL — ABNORMAL HIGH (ref <117)

## 2013-09-15 MED ORDER — OXYCODONE-ACETAMINOPHEN 10-325 MG PO TABS: 1.0000 | ORAL_TABLET | ORAL | Status: DC | PRN

## 2013-09-15 MED ORDER — PREDNISONE 20 MG PO TABS
ORAL_TABLET | ORAL | Status: DC
Start: 2013-09-15 — End: 2013-10-06

## 2013-09-15 NOTE — Addendum Note (Signed)
Addended by: Shary Decamp B on: 09/15/2013 11:12 AM   Modules accepted: Orders
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
## 2013-09-18 ENCOUNTER — Encounter: Payer: Self-pay | Admitting: Family Medicine

## 2013-09-28 ENCOUNTER — Emergency Department (HOSPITAL_COMMUNITY)
Admission: EM | Admit: 2013-09-28 | Discharge: 2013-09-28 | Disposition: A | Discharge status: Home / Self Care | Payer: Medicare Other | Attending: Emergency Medicine | Admitting: Emergency Medicine

## 2013-09-28 ENCOUNTER — Emergency Department (HOSPITAL_COMMUNITY): Payer: Medicare Other

## 2013-09-28 ENCOUNTER — Encounter (HOSPITAL_COMMUNITY): Payer: Self-pay | Admitting: Emergency Medicine

## 2013-09-28 DIAGNOSIS — E785 Hyperlipidemia, unspecified: Secondary | ICD-10-CM | POA: Insufficient documentation

## 2013-09-28 DIAGNOSIS — M7989 Other specified soft tissue disorders: Secondary | ICD-10-CM | POA: Diagnosis not present

## 2013-09-28 DIAGNOSIS — I10 Essential (primary) hypertension: Secondary | ICD-10-CM | POA: Insufficient documentation

## 2013-09-28 DIAGNOSIS — J449 Chronic obstructive pulmonary disease, unspecified: Secondary | ICD-10-CM | POA: Insufficient documentation

## 2013-09-28 DIAGNOSIS — S99919A Unspecified injury of unspecified ankle, initial encounter: Secondary | ICD-10-CM | POA: Diagnosis present

## 2013-09-28 DIAGNOSIS — I2510 Atherosclerotic heart disease of native coronary artery without angina pectoris: Secondary | ICD-10-CM | POA: Diagnosis not present

## 2013-09-28 DIAGNOSIS — Z8739 Personal history of other diseases of the musculoskeletal system and connective tissue: Secondary | ICD-10-CM | POA: Diagnosis not present

## 2013-09-28 DIAGNOSIS — G8929 Other chronic pain: Secondary | ICD-10-CM | POA: Insufficient documentation

## 2013-09-28 DIAGNOSIS — S99929A Unspecified injury of unspecified foot, initial encounter: Secondary | ICD-10-CM | POA: Diagnosis present

## 2013-09-28 DIAGNOSIS — S8002XA Contusion of left knee, initial encounter: Secondary | ICD-10-CM

## 2013-09-28 DIAGNOSIS — Z8551 Personal history of malignant neoplasm of bladder: Secondary | ICD-10-CM | POA: Insufficient documentation

## 2013-09-28 DIAGNOSIS — R296 Repeated falls: Secondary | ICD-10-CM | POA: Diagnosis not present

## 2013-09-28 DIAGNOSIS — Z7902 Long term (current) use of antithrombotics/antiplatelets: Secondary | ICD-10-CM | POA: Insufficient documentation

## 2013-09-28 DIAGNOSIS — Z79899 Other long term (current) drug therapy: Secondary | ICD-10-CM | POA: Insufficient documentation

## 2013-09-28 DIAGNOSIS — Y9301 Activity, walking, marching and hiking: Secondary | ICD-10-CM | POA: Insufficient documentation

## 2013-09-28 DIAGNOSIS — F17200 Nicotine dependence, unspecified, uncomplicated: Secondary | ICD-10-CM | POA: Insufficient documentation

## 2013-09-28 DIAGNOSIS — S8000XA Contusion of unspecified knee, initial encounter: Secondary | ICD-10-CM | POA: Insufficient documentation

## 2013-09-28 DIAGNOSIS — Z860100 Personal history of colon polyps, unspecified: Secondary | ICD-10-CM | POA: Insufficient documentation

## 2013-09-28 DIAGNOSIS — Y929 Unspecified place or not applicable: Secondary | ICD-10-CM | POA: Diagnosis not present

## 2013-09-28 DIAGNOSIS — J4489 Other specified chronic obstructive pulmonary disease: Secondary | ICD-10-CM | POA: Diagnosis not present

## 2013-09-28 DIAGNOSIS — S8990XA Unspecified injury of unspecified lower leg, initial encounter: Secondary | ICD-10-CM | POA: Diagnosis not present

## 2013-09-28 DIAGNOSIS — M25569 Pain in unspecified knee: Secondary | ICD-10-CM | POA: Diagnosis not present

## 2013-09-28 LAB — CBC WITH DIFFERENTIAL/PLATELET
Basophils Absolute: 0 10*3/uL (ref 0.0–0.1)
Basophils Relative: 0 % (ref 0–1)
Eosinophils Absolute: 0 10*3/uL (ref 0.0–0.7)
Eosinophils Relative: 0 % (ref 0–5)
HCT: 40.4 % (ref 39.0–52.0)
HEMOGLOBIN: 14.2 g/dL (ref 13.0–17.0)
LYMPHS ABS: 1.6 10*3/uL (ref 0.7–4.0)
LYMPHS PCT: 11 % — AB (ref 12–46)
MCH: 31.6 pg (ref 26.0–34.0)
MCHC: 35.1 g/dL (ref 30.0–36.0)
MCV: 89.8 fL (ref 78.0–100.0)
Monocytes Absolute: 1.2 10*3/uL — ABNORMAL HIGH (ref 0.1–1.0)
Monocytes Relative: 9 % (ref 3–12)
NEUTROS ABS: 11.1 10*3/uL — AB (ref 1.7–7.7)
Neutrophils Relative %: 80 % — ABNORMAL HIGH (ref 43–77)
Platelets: 155 10*3/uL (ref 150–400)
RBC: 4.5 MIL/uL (ref 4.22–5.81)
RDW: 13.8 % (ref 11.5–15.5)
WBC: 13.9 10*3/uL — ABNORMAL HIGH (ref 4.0–10.5)

## 2013-09-28 LAB — BASIC METABOLIC PANEL
BUN: 23 mg/dL (ref 6–23)
CHLORIDE: 98 meq/L (ref 96–112)
CO2: 24 mEq/L (ref 19–32)
Calcium: 9.1 mg/dL (ref 8.4–10.5)
Creatinine, Ser: 0.63 mg/dL (ref 0.50–1.35)
GFR calc non Af Amer: >90 mL/min (ref >90)
GLUCOSE: 167 mg/dL — AB (ref 70–99)
POTASSIUM: 4.1 meq/L (ref 3.7–5.3)
Sodium: 137 mEq/L (ref 137–147)

## 2013-09-28 LAB — PROTIME-INR
INR: 0.98 (ref 0.00–1.49)
PROTHROMBIN TIME: 12.8 s (ref 11.6–15.2)

## 2013-09-28 MED ORDER — ONDANSETRON HCL 4 MG/2ML IJ SOLN
4.0000 mg | Freq: Once | INTRAMUSCULAR | Status: AC
  Administered 2013-09-28: 4 mg via INTRAVENOUS
  Filled 2013-09-28: qty 2

## 2013-09-28 MED ORDER — HYDROMORPHONE HCL PF 1 MG/ML IJ SOLN
0.5000 mg | Freq: Once | INTRAMUSCULAR | Status: AC
  Administered 2013-09-28: 0.5 mg via INTRAVENOUS
  Filled 2013-09-28: qty 1

## 2013-09-28 MED ORDER — OXYCODONE-ACETAMINOPHEN 5-325 MG PO TABS: 1.0000 | ORAL_TABLET | Freq: Four times a day (QID) | ORAL | Status: DC | PRN

## 2013-09-28 NOTE — Discharge Instructions (Signed)
Hematoma A hematoma is a collection of blood under the skin, in an organ, in a body space, in a joint space, or in other tissue. The blood can clot to form a lump that you can see and feel. The lump is often firm and may sometimes become sore and tender. Most hematomas get better in a few days to weeks. However, some hematomas may be serious and require medical care. Hematomas can range in size from very small to very large. CAUSES  A hematoma can be caused by a blunt or penetrating injury. It can also be caused by spontaneous leakage from a blood vessel under the skin. Spontaneous leakage from a blood vessel is more likely to occur in older people, especially those taking blood thinners. Sometimes, a hematoma can develop after certain medical procedures. SIGNS AND SYMPTOMS   A firm lump on the body.  Possible pain and tenderness in the area.  Bruising.Blue, dark blue, purple-red, or yellowish skin may appear at the site of the hematoma if the hematoma is close to the surface of the skin. For hematomas in deeper tissues or body spaces, the signs and symptoms may be subtle. For example, an intra-abdominal hematoma may cause abdominal pain, weakness, fainting, and shortness of breath. An intracranial hematoma may cause a headache or symptoms such as weakness, trouble speaking, or a change in consciousness. DIAGNOSIS  A hematoma can usually be diagnosed based on your medical history and a physical exam. Imaging tests may be needed if your health care provider suspects a hematoma in deeper tissues or body spaces, such as the abdomen, head, or chest. These tests may include ultrasonography or a CT scan.  TREATMENT  Hematomas usually go away on their own over time. Rarely does the blood need to be drained out of the body. Large hematomas or those that may affect vital organs will sometimes need surgical drainage or monitoring. HOME CARE INSTRUCTIONS   Apply ice to the injured area:   Put ice in a  plastic bag.   Place a towel between your skin and the bag.   Leave the ice on for 20 minutes, 2 3 times a day for the first 1 to 2 days.   After the first 2 days, switch to using warm compresses on the hematoma.   Elevate the injured area to help decrease pain and swelling. Wrapping the area with an elastic bandage may also be helpful. Compression helps to reduce swelling and promotes shrinking of the hematoma. Make sure the bandage is not wrapped too tight.   If your hematoma is on a lower extremity and is painful, crutches may be helpful for a couple days.   Only take over-the-counter or prescription medicines as directed by your health care provider. SEEK IMMEDIATE MEDICAL CARE IF:   You have increasing pain, or your pain is not controlled with medicine.   You have a fever.   You have worsening swelling or discoloration.   Your skin over the hematoma breaks or starts bleeding.   Your hematoma is in your chest or abdomen and you have weakness, shortness of breath, or a change in consciousness.  Your hematoma is on your scalp (caused by a fall or injury) and you have a worsening headache or a change in alertness or consciousness. MAKE SURE YOU:   Understand these instructions.  Will watch your condition.  Will get help right away if you are not doing well or get worse. Document Released: 01/28/2004 Document Revised: 02/15/2013 Document Reviewed:  11/23/2012 [employer] Patient Information 2014 [place].  Periosteal Hematoma Periosteal hematoma (bone bruise) is a localized, tender, raised area close to the bone. It can occur from a small hidden fracture of the bone, following surgery, or from other trauma to the area. It typically occurs in bones located close to the surface of the skin, such as the shin, knee, and heel bone. Although it may take 2 or more weeks to completely heal, bone bruises typically are not associated with permanent or serious damage to the  bone. If you are taking blood thinners, you may be at greater risk for such injuries.  CAUSES  A bone bruise is usually caused by high-impact trauma to the bone, but it can be caused by sports injuries or twisting injuries. SIGNS AND SYMPTOMS   Severe pain around the injured area that typically lasts longer than a normal bruise.  Difficulty using the bruised area.  Tender, raised area close to the bone.  Discoloration or swelling of the bruised area. DIAGNOSIS  You may need an MRI of the injured area to confirm a bone bruise if your health care provider feels it is necessary. A regular X-ray will not detect a bone bruise, but it will detect a broken bone (fracture). An X-ray may be taken to rule out any fractures. TREATMENT  Often, the best treatment for a bone bruise is resting, icing, and applying cold compresses to the injured area. Over-the-counter medicines may also be recommended for pain control. HOME CARE INSTRUCTIONS  Some things you can do to improve the condition are:   Rest and elevate the area of injury as long as it is very tender or swollen.  Apply ice to the injured area:  Put ice in a plastic bag.  Place a towel between your skin and the bag.  Leave the ice on for 20 minutes, 2 3 times a day.  Use an elastic wrap to reduce swelling and protect the injured area. Make sure it is not applied too tightly. If the area around the wrap becomes cold or blue, the wrap is too tight. Wrap it more loosely.  For activity:  Follow your health care provider's instructions about whether walking with crutches is required. This will depend on how serious your condition is.  Start weight bearing gradually on the bruised part.  Continue to use crutches or a cane until you can stand without causing pain, or as instructed.  If a plaster splint was applied:  Wear the splint until you are seen for a follow-up exam.  Rest it on nothing harder than a pillow the first 24 hours.  Do  not put weight on it.  Do not get it wet. You may take it off to take a shower or bath.  You may have been given an elastic bandage to use with or without the plaster splint. The splint is too tight if you have numbness or tingling, or if the skin around the bandage becomes cold and blue. Adjust the bandage to make it comfortable.  If an air splint was applied:  You may alter the amount of air in the splint as needed for comfort.  You may take it off at night and to take a shower or bath.  If the injury was in either leg, wiggle your toes in the splint several times per day if you are able.  Only take over-the-counter or prescription medicines for pain, discomfort, or fever as directed by your health care provider.  Keep  all follow-up visits with your health care provider. This includes any orthopedic referrals, physical therapy, and rehabilitation. Any delay in getting necessary care could result in a delay or failure of the bones to heal. SEEK MEDICAL CARE IF:   You have an increase in bruising, swelling, tenderness, heat, or pain over your injury.  You notice coldness of your toes that does not improve after removing a splint or bandage.  Your pain is not lessened after you take medicine.  You have increased difficulty bearing weight on the injured leg, if the injury is in either leg. SEEK IMMEDIATE MEDICAL CARE IF:   You have severe pain near the injured area or severe pain with stretching.  You have increased swelling that resulted in a tense, hard area or a loss of sensation in the area of the injury.  You have pale, cool skin below the area of the injury (in an extremity) that does not go away after removing a splint or bandage. MAKE SURE YOU:   Understand these instructions.  Will watch your condition.  Will get help right away if you are not doing well or get worse. Document Released: 07/23/2004 Document Revised: 04/05/2013 Document Reviewed: 12/02/2012 [facility]  Patient Information 2014 [place], [place].

## 2013-09-28 NOTE — Progress Notes (Signed)
Orthopedic Tech Progress Note Patient Details:  [name] [birth year] [id number]  Ortho Devices Type of Ortho Device: Knee Immobilizer Ortho Device/Splint Location: lle Ortho Device/Splint Interventions: Application   Shirell Struthers 09/28/2013, 7:56 PM
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
## 2013-09-28 NOTE — ED Notes (Signed)
Reports hx of knee pain, reports walking today and his leg gave out, having increase in pain to knee, swelling and bruising noted.

## 2013-09-28 NOTE — ED Notes (Signed)
Ortho at bedside.

## 2013-09-30 NOTE — ED Provider Notes (Signed)
Medical screening examination/treatment/procedure(s) were performed by non-physician practitioner and as supervising physician I was immediately available for consultation/collaboration.   EKG Interpretation None        Orpah Greek, MD 09/30/13 [phone]

## 2013-10-02 ENCOUNTER — Emergency Department (HOSPITAL_COMMUNITY)
Admission: EM | Admit: 2013-10-02 | Discharge: 2013-10-02 | Disposition: A | Discharge status: Home / Self Care | Payer: Medicare Other | Attending: Emergency Medicine | Admitting: Emergency Medicine

## 2013-10-02 ENCOUNTER — Encounter (HOSPITAL_COMMUNITY): Payer: Self-pay | Admitting: Emergency Medicine

## 2013-10-02 ENCOUNTER — Telehealth: Payer: Self-pay | Admitting: *Deleted

## 2013-10-02 DIAGNOSIS — I10 Essential (primary) hypertension: Secondary | ICD-10-CM | POA: Diagnosis not present

## 2013-10-02 DIAGNOSIS — G8911 Acute pain due to trauma: Secondary | ICD-10-CM | POA: Insufficient documentation

## 2013-10-02 DIAGNOSIS — Z7902 Long term (current) use of antithrombotics/antiplatelets: Secondary | ICD-10-CM | POA: Diagnosis not present

## 2013-10-02 DIAGNOSIS — M79609 Pain in unspecified limb: Secondary | ICD-10-CM | POA: Insufficient documentation

## 2013-10-02 DIAGNOSIS — J449 Chronic obstructive pulmonary disease, unspecified: Secondary | ICD-10-CM | POA: Diagnosis not present

## 2013-10-02 DIAGNOSIS — M7989 Other specified soft tissue disorders: Secondary | ICD-10-CM | POA: Diagnosis not present

## 2013-10-02 DIAGNOSIS — M1990 Unspecified osteoarthritis, unspecified site: Secondary | ICD-10-CM | POA: Diagnosis not present

## 2013-10-02 DIAGNOSIS — Z79899 Other long term (current) drug therapy: Secondary | ICD-10-CM | POA: Diagnosis not present

## 2013-10-02 DIAGNOSIS — I2510 Atherosclerotic heart disease of native coronary artery without angina pectoris: Secondary | ICD-10-CM | POA: Diagnosis not present

## 2013-10-02 DIAGNOSIS — G8929 Other chronic pain: Secondary | ICD-10-CM | POA: Insufficient documentation

## 2013-10-02 DIAGNOSIS — E785 Hyperlipidemia, unspecified: Secondary | ICD-10-CM | POA: Diagnosis not present

## 2013-10-02 DIAGNOSIS — J4489 Other specified chronic obstructive pulmonary disease: Secondary | ICD-10-CM | POA: Insufficient documentation

## 2013-10-02 DIAGNOSIS — Z860100 Personal history of colon polyps, unspecified: Secondary | ICD-10-CM | POA: Diagnosis not present

## 2013-10-02 DIAGNOSIS — Z8551 Personal history of malignant neoplasm of bladder: Secondary | ICD-10-CM | POA: Insufficient documentation

## 2013-10-02 DIAGNOSIS — F17200 Nicotine dependence, unspecified, uncomplicated: Secondary | ICD-10-CM | POA: Diagnosis not present

## 2013-10-02 DIAGNOSIS — R269 Unspecified abnormalities of gait and mobility: Secondary | ICD-10-CM | POA: Diagnosis not present

## 2013-10-02 DIAGNOSIS — S8012XA Contusion of left lower leg, initial encounter: Secondary | ICD-10-CM

## 2013-10-02 DIAGNOSIS — S8010XA Contusion of unspecified lower leg, initial encounter: Secondary | ICD-10-CM | POA: Diagnosis not present

## 2013-10-02 LAB — CBC WITH DIFFERENTIAL/PLATELET
Basophils Absolute: 0 10*3/uL (ref 0.0–0.1)
Basophils Relative: 0 % (ref 0–1)
Eosinophils Absolute: 0 10*3/uL (ref 0.0–0.7)
Eosinophils Relative: 0 % (ref 0–5)
HEMATOCRIT: 33.7 % — AB (ref 39.0–52.0)
HEMOGLOBIN: 11.6 g/dL — AB (ref 13.0–17.0)
Lymphocytes Relative: 15 % (ref 12–46)
Lymphs Abs: 1.5 10*3/uL (ref 0.7–4.0)
MCH: 30.9 pg (ref 26.0–34.0)
MCHC: 34.4 g/dL (ref 30.0–36.0)
MCV: 89.9 fL (ref 78.0–100.0)
MONO ABS: 0.7 10*3/uL (ref 0.1–1.0)
MONOS PCT: 7 % (ref 3–12)
NEUTROS ABS: 8 10*3/uL — AB (ref 1.7–7.7)
Neutrophils Relative %: 78 % — ABNORMAL HIGH (ref 43–77)
Platelets: 160 10*3/uL (ref 150–400)
RBC: 3.75 MIL/uL — ABNORMAL LOW (ref 4.22–5.81)
RDW: 14 % (ref 11.5–15.5)
WBC: 10.2 10*3/uL (ref 4.0–10.5)

## 2013-10-02 LAB — PROTIME-INR
INR: 1 (ref 0.00–1.49)
Prothrombin Time: 13 seconds (ref 11.6–15.2)

## 2013-10-02 MED ORDER — HYDROMORPHONE HCL PF 1 MG/ML IJ SOLN
1.0000 mg | Freq: Once | INTRAMUSCULAR | Status: AC
  Administered 2013-10-02: 1 mg via INTRAVENOUS
  Filled 2013-10-02: qty 1

## 2013-10-02 MED ORDER — OXYCODONE HCL 5 MG PO TABS
10.0000 mg | ORAL_TABLET | Freq: Once | ORAL | Status: AC
  Administered 2013-10-02: 10 mg via ORAL
  Filled 2013-10-02: qty 2

## 2013-10-02 NOTE — ED Provider Notes (Signed)
Medical screening examination/treatment/procedure(s) were conducted as a shared visit with non-physician practitioner(s) or resident and myself. I personally evaluated the patient during the encounter and agree with the findings and plan unless otherwise indicated.  I have personally reviewed any xrays and/ or EKG's with the provider and I agree with interpretation.  Patient with known vascular disease presents to the ED with worsening pain, swelling and ecchymosis to left lower extremity since fall on Thursday. Patient was evaluated at that time. X-ray did not reveal acute fracture. Patient has been able to get around however difficult due to pain and swelling. Patient is on Plavix due coronary artery disease and vascular history. On exam patient has diffuse swelling from mid thigh down on the left, mild tenderness to palpation surrounding the knee, ecchymosis from mid thigh down to the dorsal aspect of left foot. 2+ distal pulses intact, sensation intact without crepitus the left lower extremity. Went for ultrasound to look for DVT, pain medicines and gait tests and ED. Resident arranged close outpt fup. No blood clot on [place].   Left leg swelling, left leg ecchymosis, Anemia   Mariea Clonts, MD 10/02/13 [phone]
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
## 2013-10-02 NOTE — Discharge Instructions (Signed)
Hematoma A hematoma is a collection of blood under the skin, in an organ, in a body space, in a joint space, or in other tissue. The blood can clot to form a lump that you can see and feel. The lump is often firm and may sometimes become sore and tender. Most hematomas get better in a few days to weeks. However, some hematomas may be serious and require medical care. Hematomas can range in size from very small to very large. CAUSES  A hematoma can be caused by a blunt or penetrating injury. It can also be caused by spontaneous leakage from a blood vessel under the skin. Spontaneous leakage from a blood vessel is more likely to occur in older people, especially those taking blood thinners. Sometimes, a hematoma can develop after certain medical procedures. SIGNS AND SYMPTOMS   A firm lump on the body.  Possible pain and tenderness in the area.  Bruising.Blue, dark blue, purple-red, or yellowish skin may appear at the site of the hematoma if the hematoma is close to the surface of the skin. For hematomas in deeper tissues or body spaces, the signs and symptoms may be subtle. For example, an intra-abdominal hematoma may cause abdominal pain, weakness, fainting, and shortness of breath. An intracranial hematoma may cause a headache or symptoms such as weakness, trouble speaking, or a change in consciousness. DIAGNOSIS  A hematoma can usually be diagnosed based on your medical history and a physical exam. Imaging tests may be needed if your health care provider suspects a hematoma in deeper tissues or body spaces, such as the abdomen, head, or chest. These tests may include ultrasonography or a CT scan.  TREATMENT  Hematomas usually go away on their own over time. Rarely does the blood need to be drained out of the body. Large hematomas or those that may affect vital organs will sometimes need surgical drainage or monitoring. HOME CARE INSTRUCTIONS   Apply ice to the injured area:   Put ice in a  plastic bag.   Place a towel between your skin and the bag.   Leave the ice on for 20 minutes, 2 3 times a day for the first 1 to 2 days.   After the first 2 days, switch to using warm compresses on the hematoma.   Elevate the injured area to help decrease pain and swelling. Wrapping the area with an elastic bandage may also be helpful. Compression helps to reduce swelling and promotes shrinking of the hematoma. Make sure the bandage is not wrapped too tight.   If your hematoma is on a lower extremity and is painful, crutches may be helpful for a couple days.   Only take over-the-counter or prescription medicines as directed by your health care provider. SEEK IMMEDIATE MEDICAL CARE IF:   You have increasing pain, or your pain is not controlled with medicine.   You have a fever.   You have worsening swelling or discoloration.   Your skin over the hematoma breaks or starts bleeding.   Your hematoma is in your chest or abdomen and you have weakness, shortness of breath, or a change in consciousness.  Your hematoma is on your scalp (caused by a fall or injury) and you have a worsening headache or a change in alertness or consciousness. MAKE SURE YOU:   Understand these instructions.  Will watch your condition.  Will get help right away if you are not doing well or get worse. Document Released: 01/28/2004 Document Revised: 02/15/2013 Document Reviewed:   11/23/2012 [employer] Patient Information 2014 [employer].  

## 2013-10-02 NOTE — ED Notes (Signed)
Pt given sprite and [place] sandwich per MD approval
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
## 2013-10-02 NOTE — Progress Notes (Signed)
*  PRELIMINARY RESULTS* Vascular Ultrasound Left lower extremity venous duplex has been completed.  Preliminary findings: no evidence of DVT or baker's cyst.  Landry Mellow, RDMS, RVT  10/02/2013, 2:54 PM

## 2013-10-02 NOTE — Telephone Encounter (Signed)
Pt called this morning wanting to know if we can schedule pt to be seen today, he is c/o waking up Saturday morning knee swollen and then woke up Sunday morning knee swollen more, black and now toes are black. I informed pt that he needed to go to ED for evaluation, pt stated that he could not go to hospital d/t he is home alone and no one to watch his pets, I recommended him to go to ED and he stated he will go.

## 2013-10-02 NOTE — ED Notes (Signed)
Pt given a sprite and [place] sandwich to have while waiting for friend to drive him home.
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
## 2013-10-03 ENCOUNTER — Other Ambulatory Visit: Payer: Self-pay | Admitting: Family Medicine

## 2013-10-03 ENCOUNTER — Ambulatory Visit: Payer: Medicare Other | Admitting: Family Medicine

## 2013-10-03 DIAGNOSIS — R7989 Other specified abnormal findings of blood chemistry: Secondary | ICD-10-CM

## 2013-10-05 ENCOUNTER — Other Ambulatory Visit: Payer: Self-pay | Admitting: Family Medicine

## 2013-10-05 ENCOUNTER — Other Ambulatory Visit: Payer: Medicare Other

## 2013-10-05 DIAGNOSIS — R7989 Other specified abnormal findings of blood chemistry: Secondary | ICD-10-CM

## 2013-10-05 LAB — CBC W/MCH & 3 PART DIFF
HEMATOCRIT: 37.9 % — AB (ref 39.0–52.0)
Hemoglobin: 12.8 g/dL — ABNORMAL LOW (ref 13.0–17.0)
LYMPHS ABS: 2.6 10*3/uL (ref 0.7–4.0)
LYMPHS PCT: 22 % (ref 12–46)
MCH: 31.5 pg (ref 26.0–34.0)
MCHC: 33.8 g/dL (ref 30.0–36.0)
MCV: 93.8 fL (ref 78.0–100.0)
Neutro Abs: 8.4 10*3/uL — ABNORMAL HIGH (ref 1.7–7.7)
Neutrophils Relative %: 72 % (ref 43–77)
Platelets: 194 10*3/uL (ref 150–400)
RBC: 4.06 MIL/uL — AB (ref 4.22–5.81)
RDW: 16.4 % — ABNORMAL HIGH (ref 11.5–15.5)
WBC mixed population: 0.7 10*3/uL (ref 0.1–1.8)
WBC: 11.7 10*3/uL — ABNORMAL HIGH (ref 4.0–10.5)
WBCMIXPER: 6 % (ref 3–18)

## 2013-10-05 MED ORDER — HYDROMORPHONE HCL 4 MG PO TABS: 4.0000 mg | ORAL_TABLET | Freq: Four times a day (QID) | ORAL | Status: DC | PRN

## 2013-10-06 ENCOUNTER — Ambulatory Visit (INDEPENDENT_AMBULATORY_CARE_PROVIDER_SITE_OTHER): Payer: Medicare Other | Admitting: Family Medicine

## 2013-10-06 ENCOUNTER — Encounter: Payer: Self-pay | Admitting: Family Medicine

## 2013-10-06 VITALS — BP 122/74 | HR 78 | Temp 97.2°F | Resp 22 | Ht 70.0 in | Wt 200.0 lb

## 2013-10-06 DIAGNOSIS — M79609 Pain in unspecified limb: Secondary | ICD-10-CM | POA: Diagnosis not present

## 2013-10-06 DIAGNOSIS — M79605 Pain in left leg: Secondary | ICD-10-CM
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
## 2013-10-12 ENCOUNTER — Ambulatory Visit (INDEPENDENT_AMBULATORY_CARE_PROVIDER_SITE_OTHER): Payer: Medicare Other | Admitting: Family Medicine

## 2013-10-12 ENCOUNTER — Encounter: Payer: Self-pay | Admitting: Family Medicine

## 2013-10-12 VITALS — BP 132/78 | HR 78 | Temp 97.1°F | Resp 18 | Ht 70.0 in | Wt 200.0 lb

## 2013-10-12 DIAGNOSIS — M25569 Pain in unspecified knee: Secondary | ICD-10-CM

## 2013-10-12 DIAGNOSIS — L0291 Cutaneous abscess, unspecified: Secondary | ICD-10-CM

## 2013-10-12 DIAGNOSIS — L0390 Cellulitis, unspecified: Secondary | ICD-10-CM | POA: Diagnosis not present

## 2013-10-12 DIAGNOSIS — M79605 Pain in left leg: Secondary | ICD-10-CM

## 2013-10-12 DIAGNOSIS — M79609 Pain in unspecified limb: Secondary | ICD-10-CM

## 2013-10-12 DIAGNOSIS — M25562 Pain in left knee: Secondary | ICD-10-CM

## 2013-10-12 LAB — CBC WITH DIFFERENTIAL/PLATELET
BASOS PCT: 0 % (ref 0–1)
Basophils Absolute: 0 10*3/uL (ref 0.0–0.1)
Eosinophils Absolute: 0.1 10*3/uL (ref 0.0–0.7)
Eosinophils Relative: 1 % (ref 0–5)
HEMATOCRIT: 39 % (ref 39.0–52.0)
Hemoglobin: 13.1 g/dL (ref 13.0–17.0)
Lymphocytes Relative: 25 % (ref 12–46)
Lymphs Abs: 1.8 10*3/uL (ref 0.7–4.0)
MCH: 30.4 pg (ref 26.0–34.0)
MCHC: 33.6 g/dL (ref 30.0–36.0)
MCV: 90.5 fL (ref 78.0–100.0)
MONO ABS: 0.4 10*3/uL (ref 0.1–1.0)
Monocytes Relative: 6 % (ref 3–12)
NEUTROS ABS: 4.9 10*3/uL (ref 1.7–7.7)
NEUTROS PCT: 68 % (ref 43–77)
PLATELETS: 174 10*3/uL (ref 150–400)
RBC: 4.31 MIL/uL (ref 4.22–5.81)
RDW: 16 % — AB (ref 11.5–15.5)
WBC: 7.2 10*3/uL (ref 4.0–10.5)

## 2013-10-12 MED ORDER — HYDROMORPHONE HCL 4 MG PO TABS: 4.0000 mg | ORAL_TABLET | Freq: Four times a day (QID) | ORAL | Status: DC | PRN

## 2013-10-12 MED ORDER — CEPHALEXIN 500 MG PO CAPS: 500.0000 mg | ORAL_CAPSULE | Freq: Three times a day (TID) | ORAL | Status: DC
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
## 2013-10-14 ENCOUNTER — Other Ambulatory Visit: Payer: Self-pay | Admitting: Family Medicine

## 2013-10-16 ENCOUNTER — Telehealth: Payer: Self-pay | Admitting: Family Medicine

## 2013-10-16 NOTE — Telephone Encounter (Signed)
Message copied by Alyson Locket on Mon Oct 16, 2013  1:00 PM ------      Message from: Devoria Glassing      Created: Mon Oct 16, 2013 11:44 AM       Patient left message requesting refill on his pain medication  ------

## 2013-10-16 NOTE — Telephone Encounter (Signed)
ok 

## 2013-10-17 MED ORDER — OXYCODONE-ACETAMINOPHEN 10-325 MG PO TABS: 1.0000 | ORAL_TABLET | ORAL | Status: DC | PRN

## 2013-10-17 NOTE — Telephone Encounter (Signed)
RX printed, left up front and patient aware to pick up  

## 2013-11-16 ENCOUNTER — Telehealth: Payer: Self-pay | Admitting: Family Medicine

## 2013-11-16 MED ORDER — OXYCODONE-ACETAMINOPHEN 10-325 MG PO TABS: 1.0000 | ORAL_TABLET | ORAL | Status: DC | PRN

## 2013-11-16 NOTE — Telephone Encounter (Signed)
[phone]  Pt is needing a refill on oxyCODONE-acetaminophen (PERCOCET) 10-325 MG per tablet

## 2013-11-16 NOTE — Telephone Encounter (Signed)
?   OK to Refill  

## 2013-11-16 NOTE — Telephone Encounter (Signed)
ok 

## 2013-11-16 NOTE — Telephone Encounter (Signed)
RX printed, left up front and patient aware to pick up  

## 2013-12-18 ENCOUNTER — Telehealth: Payer: Self-pay | Admitting: Family Medicine

## 2013-12-18 MED ORDER — OXYCODONE-ACETAMINOPHEN 10-325 MG PO TABS: 1.0000 | ORAL_TABLET | ORAL | Status: DC | PRN

## 2013-12-18 NOTE — Telephone Encounter (Signed)
ok 

## 2013-12-18 NOTE — Telephone Encounter (Signed)
[phone]  PT is needing a refill on oxyCODONE-acetaminophen (PERCOCET) 10-325 MG per tablet

## 2013-12-18 NOTE — Telephone Encounter (Signed)
RX printed, left up front and patient aware to pick up  

## 2013-12-18 NOTE — Telephone Encounter (Signed)
?   OK to Refill  

## 2013-12-21 ENCOUNTER — Other Ambulatory Visit: Payer: Self-pay | Admitting: Internal Medicine

## 2014-01-16 ENCOUNTER — Telehealth: Payer: Self-pay | Admitting: Family Medicine

## 2014-01-16 MED ORDER — OXYCODONE-ACETAMINOPHEN 10-325 MG PO TABS: 1.0000 | ORAL_TABLET | ORAL | Status: DC | PRN

## 2014-01-16 NOTE — Telephone Encounter (Signed)
[phone]   Pt is needing a refill on oxyCODONE-acetaminophen (PERCOCET) 10-325 MG per tablet

## 2014-01-16 NOTE — Telephone Encounter (Signed)
ok 

## 2014-01-16 NOTE — Telephone Encounter (Signed)
?   OK to Refill  

## 2014-01-17 NOTE — Telephone Encounter (Signed)
RX printed, left up front and patient aware to pick up  

## 2014-01-19 ENCOUNTER — Other Ambulatory Visit: Payer: Self-pay | Admitting: Family Medicine

## 2014-01-19 NOTE — Telephone Encounter (Signed)
Refill appropriate and filled per protocol. 

## 2014-02-15 ENCOUNTER — Telehealth: Payer: Self-pay | Admitting: Family Medicine

## 2014-02-15 MED ORDER — OXYCODONE-ACETAMINOPHEN 10-325 MG PO TABS: 1.0000 | ORAL_TABLET | ORAL | Status: DC | PRN

## 2014-02-15 NOTE — Telephone Encounter (Signed)
ok 

## 2014-02-15 NOTE — Telephone Encounter (Signed)
?   OK to Refill  

## 2014-02-15 NOTE — Telephone Encounter (Signed)
RX printed, left up front and patient aware to pick up  

## 2014-02-15 NOTE — Telephone Encounter (Signed)
Patient calling to get rx for his percocet  [phone] when ready for pick up

## 2014-03-20 ENCOUNTER — Other Ambulatory Visit: Payer: Self-pay | Admitting: Family Medicine

## 2014-03-20 ENCOUNTER — Encounter: Payer: Self-pay | Admitting: Family Medicine

## 2014-03-20 ENCOUNTER — Telehealth: Payer: Self-pay | Admitting: Family Medicine

## 2014-03-20 NOTE — Telephone Encounter (Signed)
[phone] Patient is calling to get rx for his oxycodone if possible

## 2014-03-20 NOTE — Telephone Encounter (Signed)
Medication refill for one time only.  Patient needs to be seen.  Letter sent for patient to call and schedule 

## 2014-03-21 NOTE — Telephone Encounter (Signed)
Last Rf 8/20  #180.  Last OV 10/12/13  OK refill?

## 2014-03-22 MED ORDER — OXYCODONE-ACETAMINOPHEN 10-325 MG PO TABS: 1.0000 | ORAL_TABLET | ORAL | Status: DC | PRN

## 2014-03-22 NOTE — Telephone Encounter (Signed)
ok 

## 2014-03-22 NOTE — Telephone Encounter (Signed)
Rx printed and pt made aware ready for pick up

## 2014-04-09 ENCOUNTER — Encounter: Payer: Self-pay | Admitting: Family Medicine

## 2014-04-09 ENCOUNTER — Ambulatory Visit (INDEPENDENT_AMBULATORY_CARE_PROVIDER_SITE_OTHER): Payer: Medicare Other | Admitting: Family Medicine

## 2014-04-09 VITALS — BP 122/64 | HR 80 | Temp 98.2°F | Resp 18 | Ht 70.0 in | Wt 206.0 lb

## 2014-04-09 DIAGNOSIS — Z23 Encounter for immunization: Secondary | ICD-10-CM

## 2014-04-09 DIAGNOSIS — E785 Hyperlipidemia, unspecified: Secondary | ICD-10-CM | POA: Diagnosis not present

## 2014-04-09 DIAGNOSIS — R7309 Other abnormal glucose: Secondary | ICD-10-CM

## 2014-04-09 DIAGNOSIS — R7303 Prediabetes: Secondary | ICD-10-CM

## 2014-04-09 DIAGNOSIS — R0989 Other specified symptoms and signs involving the circulatory and respiratory systems: Secondary | ICD-10-CM

## 2014-04-09 DIAGNOSIS — I10 Essential (primary) hypertension: Secondary | ICD-10-CM

## 2014-04-09 DIAGNOSIS — I2510 Atherosclerotic heart disease of native coronary artery without angina pectoris: Secondary | ICD-10-CM
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
## 2014-04-10 ENCOUNTER — Ambulatory Visit
Admission: RE | Admit: 2014-04-10 | Discharge: 2014-04-10 | Disposition: A | Discharge status: Home / Self Care | Payer: Medicare Other | Source: Ambulatory Visit | Attending: Family Medicine | Admitting: Family Medicine

## 2014-04-10 ENCOUNTER — Other Ambulatory Visit: Payer: Medicare Other

## 2014-04-10 DIAGNOSIS — E785 Hyperlipidemia, unspecified: Secondary | ICD-10-CM | POA: Diagnosis not present

## 2014-04-10 DIAGNOSIS — J8410 Pulmonary fibrosis, unspecified: Secondary | ICD-10-CM | POA: Diagnosis not present

## 2014-04-10 DIAGNOSIS — R7309 Other abnormal glucose: Secondary | ICD-10-CM | POA: Diagnosis not present

## 2014-04-10 DIAGNOSIS — I10 Essential (primary) hypertension: Secondary | ICD-10-CM | POA: Diagnosis not present

## 2014-04-10 DIAGNOSIS — J8489 Other specified interstitial pulmonary diseases: Secondary | ICD-10-CM | POA: Diagnosis not present

## 2014-04-10 DIAGNOSIS — R05 Cough: Secondary | ICD-10-CM | POA: Diagnosis not present

## 2014-04-10 DIAGNOSIS — R0989 Other specified symptoms and signs involving the circulatory and respiratory systems: Secondary | ICD-10-CM

## 2014-04-10 DIAGNOSIS — I2510 Atherosclerotic heart disease of native coronary artery without angina pectoris: Secondary | ICD-10-CM | POA: Diagnosis not present

## 2014-04-10 LAB — CBC WITH DIFFERENTIAL/PLATELET
Basophils Absolute: 0 10*3/uL (ref 0.0–0.1)
Basophils Relative: 0 % (ref 0–1)
Eosinophils Absolute: 0.2 10*3/uL (ref 0.0–0.7)
Eosinophils Relative: 2 % (ref 0–5)
HEMATOCRIT: 42.2 % (ref 39.0–52.0)
HEMOGLOBIN: 14.2 g/dL (ref 13.0–17.0)
LYMPHS PCT: 24 % (ref 12–46)
Lymphs Abs: 1.9 10*3/uL (ref 0.7–4.0)
MCH: 29.8 pg (ref 26.0–34.0)
MCHC: 33.6 g/dL (ref 30.0–36.0)
MCV: 88.5 fL (ref 78.0–100.0)
MONO ABS: 0.8 10*3/uL (ref 0.1–1.0)
MONOS PCT: 10 % (ref 3–12)
Neutro Abs: 5.1 10*3/uL (ref 1.7–7.7)
Neutrophils Relative %: 64 % (ref 43–77)
Platelets: 206 10*3/uL (ref 150–400)
RBC: 4.77 MIL/uL (ref 4.22–5.81)
RDW: 15.2 % (ref 11.5–15.5)
WBC: 8 10*3/uL (ref 4.0–10.5)

## 2014-04-10 LAB — COMPLETE METABOLIC PANEL WITH GFR
ALBUMIN: 4.3 g/dL (ref 3.5–5.2)
ALK PHOS: 50 U/L (ref 39–117)
ALT: 20 U/L (ref 0–53)
AST: 28 U/L (ref 0–37)
BILIRUBIN TOTAL: 0.7 mg/dL (ref 0.2–1.2)
BUN: 15 mg/dL (ref 6–23)
CO2: 28 meq/L (ref 19–32)
Calcium: 9.7 mg/dL (ref 8.4–10.5)
Chloride: 100 mEq/L (ref 96–112)
Creat: 0.79 mg/dL (ref 0.50–1.35)
GLUCOSE: 101 mg/dL — AB (ref 70–99)
Potassium: 4.2 mEq/L (ref 3.5–5.3)
SODIUM: 135 meq/L (ref 135–145)
TOTAL PROTEIN: 7 g/dL (ref 6.0–8.3)

## 2014-04-10 LAB — LIPID PANEL
CHOL/HDL RATIO: 3.5 ratio
CHOLESTEROL: 126 mg/dL (ref 0–200)
HDL: 36 mg/dL — AB (ref >39)
LDL Cholesterol: 70 mg/dL (ref 0–99)
Triglycerides: 99 mg/dL (ref <150)
VLDL: 20 mg/dL (ref 0–40)

## 2014-04-10 LAB — HEMOGLOBIN A1C
Hgb A1c MFr Bld: 6.3 % — ABNORMAL HIGH (ref <5.7)
MEAN PLASMA GLUCOSE: 134 mg/dL — AB (ref <117)

## 2014-04-12 ENCOUNTER — Encounter: Payer: Self-pay | Admitting: *Deleted

## 2014-04-16 ENCOUNTER — Other Ambulatory Visit: Payer: Self-pay | Admitting: Internal Medicine

## 2014-04-18 ENCOUNTER — Other Ambulatory Visit: Payer: Self-pay | Admitting: Family Medicine

## 2014-04-18 NOTE — Telephone Encounter (Signed)
Refill appropriate and filled per protocol. 

## 2014-04-23 ENCOUNTER — Telehealth: Payer: Self-pay | Admitting: Family Medicine

## 2014-04-23 MED ORDER — OXYCODONE-ACETAMINOPHEN 10-325 MG PO TABS: 1.0000 | ORAL_TABLET | ORAL | Status: DC | PRN

## 2014-04-23 NOTE — Telephone Encounter (Signed)
RX printed, left up front and patient aware to pick up  

## 2014-04-23 NOTE — Telephone Encounter (Signed)
ok 

## 2014-04-23 NOTE — Telephone Encounter (Signed)
?   OK to Refill  

## 2014-04-23 NOTE — Telephone Encounter (Signed)
Patient would like refill on oxycone  [phone] when ready

## 2014-05-05 ENCOUNTER — Other Ambulatory Visit: Payer: Self-pay | Admitting: Family Medicine

## 2014-05-17 ENCOUNTER — Encounter: Payer: Self-pay | Admitting: Internal Medicine

## 2014-05-17 ENCOUNTER — Ambulatory Visit (INDEPENDENT_AMBULATORY_CARE_PROVIDER_SITE_OTHER): Payer: Medicare Other | Admitting: Internal Medicine

## 2014-05-17 VITALS — BP 150/85 | HR 69 | Ht 70.0 in | Wt 207.0 lb

## 2014-05-17 DIAGNOSIS — I10 Essential (primary) hypertension: Secondary | ICD-10-CM | POA: Diagnosis not present

## 2014-05-17 DIAGNOSIS — I6523 Occlusion and stenosis of bilateral carotid arteries: Secondary | ICD-10-CM
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
## 2014-05-17 NOTE — Patient Instructions (Signed)
Your physician has requested that you have a carotid duplex. This test is an ultrasound of the carotid arteries in your neck. It looks at blood flow through these arteries that supply the brain with blood. Allow one hour for this exam. There are no restrictions or special instructions.  Your physician wants you to follow-up in: 1 year with Dr. Harrington Challenger.  You will receive a reminder letter in the mail two months in advance. If you don't receive a letter, please call our office to schedule the follow-up appointment.

## 2014-05-21 DIAGNOSIS — Z8551 Personal history of malignant neoplasm of bladder: Secondary | ICD-10-CM | POA: Diagnosis not present

## 2014-05-22 ENCOUNTER — Ambulatory Visit (HOSPITAL_COMMUNITY): Payer: Medicare Other | Attending: Cardiology | Admitting: *Deleted

## 2014-05-22 DIAGNOSIS — J449 Chronic obstructive pulmonary disease, unspecified: Secondary | ICD-10-CM | POA: Insufficient documentation

## 2014-05-22 DIAGNOSIS — Z951 Presence of aortocoronary bypass graft: Secondary | ICD-10-CM | POA: Insufficient documentation

## 2014-05-22 DIAGNOSIS — I2510 Atherosclerotic heart disease of native coronary artery without angina pectoris: Secondary | ICD-10-CM | POA: Insufficient documentation

## 2014-05-22 DIAGNOSIS — E785 Hyperlipidemia, unspecified: Secondary | ICD-10-CM | POA: Insufficient documentation

## 2014-05-22 DIAGNOSIS — I10 Essential (primary) hypertension: Secondary | ICD-10-CM | POA: Insufficient documentation

## 2014-05-22 DIAGNOSIS — Z720 Tobacco use: Secondary | ICD-10-CM | POA: Insufficient documentation

## 2014-05-22 DIAGNOSIS — I6523 Occlusion and stenosis of bilateral carotid arteries: Secondary | ICD-10-CM | POA: Diagnosis not present

## 2014-05-22 DIAGNOSIS — I739 Peripheral vascular disease, unspecified: Secondary | ICD-10-CM | POA: Insufficient documentation

## 2014-05-22 NOTE — Progress Notes (Signed)
Carotid duplex complete 

## 2014-05-23 ENCOUNTER — Telehealth: Payer: Self-pay | Admitting: *Deleted

## 2014-05-23 MED ORDER — OXYCODONE-ACETAMINOPHEN 10-325 MG PO TABS: 1.0000 | ORAL_TABLET | ORAL | Status: DC | PRN

## 2014-05-23 NOTE — Telephone Encounter (Signed)
?   OK to Refill  

## 2014-05-23 NOTE — Telephone Encounter (Signed)
ok 

## 2014-05-23 NOTE — Telephone Encounter (Signed)
Pt aware via vm on home and cell #'s

## 2014-05-23 NOTE — Telephone Encounter (Signed)
Pt called needing refill on percocet 10-325mg , last refill on 04/23/2014  [facility] [address]

## 2014-06-25 ENCOUNTER — Telehealth: Payer: Self-pay | Admitting: Family Medicine

## 2014-06-25 NOTE — Telephone Encounter (Signed)
?   OK to Refill  

## 2014-06-25 NOTE — Telephone Encounter (Signed)
[phone]  Pt is needing a refill on oxyCODONE-acetaminophen (PERCOCET) 10-325 MG per tablet

## 2014-06-25 NOTE — Telephone Encounter (Signed)
ok 

## 2014-06-26 MED ORDER — OXYCODONE-ACETAMINOPHEN 10-325 MG PO TABS: 1.0000 | ORAL_TABLET | ORAL | Status: DC | PRN

## 2014-06-26 NOTE — Telephone Encounter (Signed)
RX printed, left up front and patient aware to pick up via vm 

## 2014-07-05 DIAGNOSIS — H2511 Age-related nuclear cataract, right eye: Secondary | ICD-10-CM | POA: Diagnosis not present

## 2014-07-05 LAB — HM DIABETES EYE EXAM

## 2014-07-15 ENCOUNTER — Other Ambulatory Visit: Payer: Self-pay | Admitting: Family Medicine

## 2014-07-24 ENCOUNTER — Encounter: Payer: Self-pay | Admitting: Family Medicine

## 2014-07-25 ENCOUNTER — Telehealth: Payer: Self-pay | Admitting: Family Medicine

## 2014-07-25 NOTE — Telephone Encounter (Signed)
LRF 06/26/14 #180  LOV 04/09/14  OK refill?

## 2014-07-25 NOTE — Telephone Encounter (Signed)
Patient asking for refill on his percocet  [phone]

## 2014-07-26 MED ORDER — OXYCODONE-ACETAMINOPHEN 10-325 MG PO TABS: 1.0000 | ORAL_TABLET | ORAL | Status: DC | PRN

## 2014-07-26 NOTE — Telephone Encounter (Signed)
ok 

## 2014-07-26 NOTE — Telephone Encounter (Signed)
rx printed

## 2014-07-26 NOTE — Telephone Encounter (Signed)
Attempted to call pt to make aware RX was ready to pick up and phone was busy.

## 2014-08-09 ENCOUNTER — Other Ambulatory Visit: Payer: Self-pay | Admitting: Internal Medicine

## 2014-08-13 NOTE — Telephone Encounter (Signed)
PATIENT NEEDS FOLLOW UP APPT FOR REFILLS

## 2014-08-27 ENCOUNTER — Other Ambulatory Visit: Payer: Self-pay | Admitting: Family Medicine

## 2014-08-27 MED ORDER — OXYCODONE-ACETAMINOPHEN 10-325 MG PO TABS: 1.0000 | ORAL_TABLET | ORAL | Status: DC | PRN

## 2014-08-27 NOTE — Telephone Encounter (Signed)
Script printed,ready for provider signature 

## 2014-08-27 NOTE — Telephone Encounter (Signed)
Patient is calling to get rx for his percocet  [phone]

## 2014-08-27 NOTE — Telephone Encounter (Signed)
ok 

## 2014-08-27 NOTE — Telephone Encounter (Signed)
?   OK to Refill  

## 2014-09-27 ENCOUNTER — Telehealth: Payer: Self-pay | Admitting: Family Medicine

## 2014-09-27 MED ORDER — OXYCODONE-ACETAMINOPHEN 10-325 MG PO TABS: 1.0000 | ORAL_TABLET | ORAL | Status: DC | PRN

## 2014-09-27 NOTE — Telephone Encounter (Signed)
RX printed, left up front and patient aware to pick up via vm 

## 2014-09-27 NOTE — Telephone Encounter (Signed)
Patient is calling for refill on his percocet  [phone]

## 2014-09-27 NOTE — Telephone Encounter (Signed)
ok 

## 2014-09-27 NOTE — Telephone Encounter (Signed)
?   OK to Refill  

## 2014-10-04 ENCOUNTER — Other Ambulatory Visit: Payer: Self-pay | Admitting: Family Medicine

## 2014-10-29 ENCOUNTER — Telehealth: Payer: Self-pay | Admitting: Family Medicine

## 2014-10-29 MED ORDER — OXYCODONE-ACETAMINOPHEN 10-325 MG PO TABS: 1.0000 | ORAL_TABLET | ORAL | Status: DC | PRN

## 2014-10-29 NOTE — Telephone Encounter (Signed)
Patient needs refill on percocet  [phone]

## 2014-10-29 NOTE — Telephone Encounter (Signed)
ok 

## 2014-10-29 NOTE — Telephone Encounter (Signed)
RX printed, left up front and patient aware to pick up  

## 2014-10-29 NOTE — Telephone Encounter (Signed)
?   OK to Refill  

## 2014-11-02 ENCOUNTER — Ambulatory Visit (INDEPENDENT_AMBULATORY_CARE_PROVIDER_SITE_OTHER): Payer: Medicare Other | Admitting: Family Medicine

## 2014-11-02 ENCOUNTER — Encounter: Payer: Self-pay | Admitting: Family Medicine

## 2014-11-02 VITALS — BP 130/72 | HR 84 | Temp 97.9°F | Resp 20 | Ht 70.0 in | Wt 210.0 lb

## 2014-11-02 DIAGNOSIS — M25562 Pain in left knee: Secondary | ICD-10-CM

## 2014-11-02 DIAGNOSIS — I2510 Atherosclerotic heart disease of native coronary artery without angina pectoris: Secondary | ICD-10-CM

## 2014-11-02 DIAGNOSIS — M5010 Cervical disc disorder with radiculopathy, unspecified cervical region: Secondary | ICD-10-CM | POA: Diagnosis not present

## 2014-11-02 LAB — COMPLETE METABOLIC PANEL WITH GFR
ALT: 28 U/L (ref 0–53)
AST: 37 U/L (ref 0–37)
Albumin: 4.5 g/dL (ref 3.5–5.2)
Alkaline Phosphatase: 51 U/L (ref 39–117)
BUN: 16 mg/dL (ref 6–23)
CO2: 26 mEq/L (ref 19–32)
CREATININE: 0.84 mg/dL (ref 0.50–1.35)
Calcium: 9.6 mg/dL (ref 8.4–10.5)
Chloride: 100 mEq/L (ref 96–112)
GFR, Est African American: >89 mL/min
GFR, Est Non African American: 89 mL/min
Glucose, Bld: 108 mg/dL — ABNORMAL HIGH (ref 70–99)
Potassium: 4.2 mEq/L (ref 3.5–5.3)
Sodium: 138 mEq/L (ref 135–145)
Total Bilirubin: 0.7 mg/dL (ref 0.2–1.2)
Total Protein: 7.8 g/dL (ref 6.0–8.3)

## 2014-11-02 LAB — CBC WITH DIFFERENTIAL/PLATELET
Basophils Absolute: 0 10*3/uL (ref 0.0–0.1)
Basophils Relative: 0 % (ref 0–1)
EOS ABS: 0.2 10*3/uL (ref 0.0–0.7)
Eosinophils Relative: 3 % (ref 0–5)
HCT: 44.9 % (ref 39.0–52.0)
HEMOGLOBIN: 15.5 g/dL (ref 13.0–17.0)
LYMPHS ABS: 2.2 10*3/uL (ref 0.7–4.0)
LYMPHS PCT: 34 % (ref 12–46)
MCH: 31.2 pg (ref 26.0–34.0)
MCHC: 34.5 g/dL (ref 30.0–36.0)
MCV: 90.3 fL (ref 78.0–100.0)
MPV: 10.7 fL (ref 8.6–12.4)
Monocytes Absolute: 0.5 10*3/uL (ref 0.1–1.0)
Monocytes Relative: 7 % (ref 3–12)
NEUTROS ABS: 3.6 10*3/uL (ref 1.7–7.7)
NEUTROS PCT: 56 % (ref 43–77)
Platelets: 194 10*3/uL (ref 150–400)
RBC: 4.97 MIL/uL (ref 4.22–5.81)
RDW: 14.2 % (ref 11.5–15.5)
WBC: 6.5 10*3/uL (ref 4.0–10.5)

## 2014-11-02 LAB — LIPID PANEL
CHOL/HDL RATIO: 3.6 ratio
CHOLESTEROL: 139 mg/dL (ref 0–200)
HDL: 39 mg/dL — ABNORMAL LOW (ref >=40)
LDL Cholesterol: 75 mg/dL (ref 0–99)
TRIGLYCERIDES: 127 mg/dL (ref <150)
VLDL: 25 mg/dL (ref 0–40)

## 2014-11-02 MED ORDER — MELOXICAM 15 MG PO TABS: 15.0000 mg | ORAL_TABLET | Freq: Every day | ORAL | Status: DC
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
## 2014-11-06 ENCOUNTER — Encounter: Payer: Self-pay | Admitting: Family Medicine

## 2014-11-07 ENCOUNTER — Telehealth: Payer: Self-pay | Admitting: *Deleted

## 2014-11-07 DIAGNOSIS — M5010 Cervical disc disorder with radiculopathy, unspecified cervical region: Secondary | ICD-10-CM

## 2014-11-07 NOTE — Telephone Encounter (Signed)
Received a call from [place] at [facility] stating that she had spoken with pt in reference to scheduling MRI for him and pt states that he had a stent put in 2005 in his groin, Roberta and I can not find any information to where he had this done. Pt states that he say Dr. Kellie Simmering at [facility] pt when called they stated not pt of theirs. Techs at [facility] can not perform the MRI without this information, wants to know if you would like for pt to have CT instead. Please advise!

## 2014-11-08 NOTE — Telephone Encounter (Signed)
Ok with CT of C spine

## 2014-11-08 NOTE — Telephone Encounter (Signed)
CT spine ordered

## 2014-11-12 ENCOUNTER — Other Ambulatory Visit: Payer: Self-pay | Admitting: Family Medicine

## 2014-11-13 ENCOUNTER — Ambulatory Visit
Admission: RE | Admit: 2014-11-13 | Discharge: 2014-11-13 | Disposition: A | Discharge status: Home / Self Care | Payer: Medicare Other | Source: Ambulatory Visit | Attending: Family Medicine | Admitting: Family Medicine

## 2014-11-13 DIAGNOSIS — M47813 Spondylosis without myelopathy or radiculopathy, cervicothoracic region: Secondary | ICD-10-CM | POA: Diagnosis not present

## 2014-11-13 DIAGNOSIS — M5010 Cervical disc disorder with radiculopathy, unspecified cervical region: Secondary | ICD-10-CM

## 2014-11-13 DIAGNOSIS — M47812 Spondylosis without myelopathy or radiculopathy, cervical region: Secondary | ICD-10-CM | POA: Diagnosis not present

## 2014-11-16 ENCOUNTER — Other Ambulatory Visit: Payer: Self-pay | Admitting: Family Medicine

## 2014-11-16 DIAGNOSIS — M4800 Spinal stenosis, site unspecified: Secondary | ICD-10-CM

## 2014-11-22 DIAGNOSIS — I10 Essential (primary) hypertension: Secondary | ICD-10-CM | POA: Diagnosis not present

## 2014-11-22 DIAGNOSIS — M5021 Other cervical disc displacement,  high cervical region: Secondary | ICD-10-CM | POA: Diagnosis not present

## 2014-11-22 DIAGNOSIS — M542 Cervicalgia: Secondary | ICD-10-CM | POA: Diagnosis not present

## 2014-11-28 ENCOUNTER — Telehealth: Payer: Self-pay | Admitting: *Deleted

## 2014-11-28 NOTE — Telephone Encounter (Signed)
Pt calling needing refill on percocet 10-325mg    Last refill 10/29/14

## 2014-11-29 ENCOUNTER — Other Ambulatory Visit: Payer: Self-pay | Admitting: Family Medicine

## 2014-11-29 MED ORDER — OXYCODONE-ACETAMINOPHEN 10-325 MG PO TABS: 1.0000 | ORAL_TABLET | ORAL | Status: DC | PRN

## 2014-11-29 NOTE — Telephone Encounter (Signed)
ok 

## 2014-11-29 NOTE — Telephone Encounter (Signed)
Script printed,ready for provider signature 

## 2014-12-03 ENCOUNTER — Other Ambulatory Visit: Payer: Self-pay | Admitting: Neurosurgery

## 2014-12-03 DIAGNOSIS — M5021 Other cervical disc displacement,  high cervical region: Secondary | ICD-10-CM

## 2014-12-12 ENCOUNTER — Ambulatory Visit
Admission: RE | Admit: 2014-12-12 | Discharge: 2014-12-12 | Disposition: A | Discharge status: Home / Self Care | Payer: Medicare Other | Source: Ambulatory Visit | Attending: Neurosurgery | Admitting: Neurosurgery

## 2014-12-12 DIAGNOSIS — M5021 Other cervical disc displacement,  high cervical region: Secondary | ICD-10-CM

## 2014-12-12 DIAGNOSIS — M9971 Connective tissue and disc stenosis of intervertebral foramina of cervical region: Secondary | ICD-10-CM | POA: Diagnosis not present

## 2014-12-12 DIAGNOSIS — M5031 Other cervical disc degeneration,  high cervical region: Secondary | ICD-10-CM | POA: Diagnosis not present

## 2014-12-12 DIAGNOSIS — M5022 Other cervical disc displacement, mid-cervical region: Secondary | ICD-10-CM | POA: Diagnosis not present

## 2014-12-12 DIAGNOSIS — M47812 Spondylosis without myelopathy or radiculopathy, cervical region: Secondary | ICD-10-CM | POA: Diagnosis not present

## 2014-12-12 MED ORDER — MEPERIDINE HCL 100 MG/ML IJ SOLN
100.0000 mg | Freq: Once | INTRAMUSCULAR | Status: AC
  Administered 2014-12-12: 100 mg via INTRAMUSCULAR

## 2014-12-12 MED ORDER — DIAZEPAM 5 MG PO TABS
5.0000 mg | ORAL_TABLET | Freq: Once | ORAL | Status: AC
  Administered 2014-12-12: 5 mg via ORAL

## 2014-12-12 MED ORDER — IOHEXOL 300 MG/ML  SOLN
10.0000 mL | Freq: Once | INTRAMUSCULAR | Status: AC | PRN
  Administered 2014-12-12: 10 mL via INTRATHECAL

## 2014-12-12 MED ORDER — ONDANSETRON HCL 4 MG/2ML IJ SOLN
4.0000 mg | Freq: Once | INTRAMUSCULAR | Status: AC
  Administered 2014-12-12: 4 mg via INTRAMUSCULAR

## 2014-12-12 MED ORDER — ONDANSETRON HCL 4 MG/2ML IJ SOLN: 4.0000 mg | Freq: Four times a day (QID) | INTRAMUSCULAR | Status: DC | PRN

## 2014-12-12 NOTE — Progress Notes (Signed)
Patient states he has been off Plavix for the past five days.

## 2014-12-12 NOTE — Discharge Instructions (Signed)

## 2014-12-15 ENCOUNTER — Other Ambulatory Visit: Payer: Self-pay | Admitting: Family Medicine

## 2014-12-15 NOTE — Telephone Encounter (Signed)
Refill appropriate and filled per protocol. 

## 2015-01-01 ENCOUNTER — Telehealth: Payer: Self-pay | Admitting: Family Medicine

## 2015-01-01 MED ORDER — OXYCODONE-ACETAMINOPHEN 10-325 MG PO TABS: 1.0000 | ORAL_TABLET | ORAL | Status: DC | PRN

## 2015-01-01 NOTE — Telephone Encounter (Signed)
oxyCODONE-acetaminophen (PERCOCET) 10-325 MG per tablet Patient is requesting a prescription for the above medication.

## 2015-01-01 NOTE — Telephone Encounter (Signed)
?   OK to Refill  

## 2015-01-01 NOTE — Telephone Encounter (Signed)
ok 

## 2015-01-01 NOTE — Telephone Encounter (Signed)
RX printed, left up front and patient aware to pick up  

## 2015-01-03 DIAGNOSIS — Z6830 Body mass index (BMI) 30.0-30.9, adult: Secondary | ICD-10-CM | POA: Diagnosis not present

## 2015-01-03 DIAGNOSIS — I10 Essential (primary) hypertension: Secondary | ICD-10-CM | POA: Diagnosis not present

## 2015-01-03 DIAGNOSIS — M5412 Radiculopathy, cervical region: Secondary | ICD-10-CM | POA: Diagnosis not present

## 2015-01-08 ENCOUNTER — Encounter: Payer: Self-pay | Admitting: Family Medicine

## 2015-01-08 ENCOUNTER — Ambulatory Visit (INDEPENDENT_AMBULATORY_CARE_PROVIDER_SITE_OTHER): Payer: Medicare Other | Admitting: Family Medicine

## 2015-01-08 ENCOUNTER — Other Ambulatory Visit: Payer: Self-pay | Admitting: Adult Health

## 2015-01-08 VITALS — BP 150/88 | HR 92 | Temp 97.9°F | Resp 18 | Ht 70.0 in | Wt 205.0 lb

## 2015-01-08 DIAGNOSIS — M5010 Cervical disc disorder with radiculopathy, unspecified cervical region: Secondary | ICD-10-CM | POA: Diagnosis not present

## 2015-01-08 DIAGNOSIS — I2510 Atherosclerotic heart disease of native coronary artery without angina pectoris: Secondary | ICD-10-CM | POA: Diagnosis not present

## 2015-01-08 MED ORDER — HYDROMORPHONE HCL 4 MG PO TABS: 4.0000 mg | ORAL_TABLET | Freq: Four times a day (QID) | ORAL | Status: DC | PRN
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
## 2015-01-31 ENCOUNTER — Telehealth: Payer: Self-pay | Admitting: Family Medicine

## 2015-01-31 MED ORDER — OXYCODONE-ACETAMINOPHEN 10-325 MG PO TABS: 1.0000 | ORAL_TABLET | ORAL | Status: DC | PRN

## 2015-01-31 NOTE — Telephone Encounter (Signed)
?   OK to Refill  

## 2015-01-31 NOTE — Telephone Encounter (Signed)
RX printed, left up front and patient aware to pick up  

## 2015-01-31 NOTE — Telephone Encounter (Signed)
Patient is requesting a prescription for oxyCODONE-acetaminophen (PERCOCET) 10-325 MG per

## 2015-01-31 NOTE — Telephone Encounter (Signed)
ok 

## 2015-02-12 ENCOUNTER — Encounter (HOSPITAL_COMMUNITY): Payer: Self-pay | Admitting: *Deleted

## 2015-02-12 ENCOUNTER — Emergency Department (HOSPITAL_COMMUNITY)
Admission: EM | Admit: 2015-02-12 | Discharge: 2015-02-13 | Disposition: A | Discharge status: Home / Self Care | Payer: Medicare Other | Attending: Emergency Medicine | Admitting: Emergency Medicine

## 2015-02-12 DIAGNOSIS — G8929 Other chronic pain: Secondary | ICD-10-CM | POA: Insufficient documentation

## 2015-02-12 DIAGNOSIS — I2510 Atherosclerotic heart disease of native coronary artery without angina pectoris: Secondary | ICD-10-CM | POA: Insufficient documentation

## 2015-02-12 DIAGNOSIS — Z791 Long term (current) use of non-steroidal anti-inflammatories (NSAID): Secondary | ICD-10-CM | POA: Insufficient documentation

## 2015-02-12 DIAGNOSIS — Z8551 Personal history of malignant neoplasm of bladder: Secondary | ICD-10-CM | POA: Insufficient documentation

## 2015-02-12 DIAGNOSIS — Z79899 Other long term (current) drug therapy: Secondary | ICD-10-CM | POA: Insufficient documentation

## 2015-02-12 DIAGNOSIS — Z86010 Personal history of colonic polyps: Secondary | ICD-10-CM | POA: Insufficient documentation

## 2015-02-12 DIAGNOSIS — Z8739 Personal history of other diseases of the musculoskeletal system and connective tissue: Secondary | ICD-10-CM | POA: Insufficient documentation

## 2015-02-12 DIAGNOSIS — J449 Chronic obstructive pulmonary disease, unspecified: Secondary | ICD-10-CM | POA: Diagnosis not present

## 2015-02-12 DIAGNOSIS — Z720 Tobacco use: Secondary | ICD-10-CM | POA: Insufficient documentation

## 2015-02-12 DIAGNOSIS — L500 Allergic urticaria: Secondary | ICD-10-CM | POA: Diagnosis not present

## 2015-02-12 DIAGNOSIS — E785 Hyperlipidemia, unspecified: Secondary | ICD-10-CM | POA: Diagnosis not present

## 2015-02-12 DIAGNOSIS — I10 Essential (primary) hypertension: Secondary | ICD-10-CM | POA: Diagnosis not present

## 2015-02-12 DIAGNOSIS — R21 Rash and other nonspecific skin eruption: Secondary | ICD-10-CM | POA: Diagnosis present

## 2015-02-12 DIAGNOSIS — L508 Other urticaria: Secondary | ICD-10-CM | POA: Insufficient documentation

## 2015-02-12 NOTE — ED Notes (Signed)
Pt c/o rash to rt leg. States that it was "bubbly" looking. Leg now appears red. States he ate bojangles fries for the first time today at 1600. States that rash states 45 minutes ago. Denies any trouble breathing.

## 2015-02-13 NOTE — Discharge Instructions (Signed)
Take benadryl or over the counter hydrocortisone cream as needed for any itching/rash. Continue your usual home medications. Get plenty of rest and drink plenty of fluids. Avoid any known triggers. Please followup with your primary doctor for recheck in 1 week. Return to the ER for changes or worsening symptoms   Allergies  Allergies may happen from anything your body is sensitive to. This may be food, medicines, pollens, chemicals, and many other things. Food allergies can be severe and deadly.  HOME CARE  If you do not know what causes a reaction, keep a diary. Write down the foods you ate and the symptoms that followed. Avoid foods that cause reactions.  If you have red raised spots (hives) or a rash:  Take medicine as told by your doctor.  Use medicines for red raised spots and itching as needed.  Apply cold cloths (compresses) to the skin. Take a cool bath. Avoid hot baths or showers.  If you are severely allergic:  It is often necessary to go to the hospital after you have treated your reaction.  Wear your medical alert jewelry.  You and your family must learn how to give a allergy shot or use an allergy kit (anaphylaxis kit).  Always carry your allergy kit or shot with you. Use this medicine as told by your doctor if a severe reaction is occurring. GET HELP RIGHT AWAY IF:  You have trouble breathing or are making high-pitched whistling sounds (wheezing).  You have a tight feeling in your chest or throat.  You have a puffy (swollen) mouth.  You have red raised spots, puffiness (swelling), or itching all over your body.  You have had a severe reaction that was helped by your allergy kit or shot. The reaction can return once the medicine has worn off.  You think you are having a food allergy. Symptoms most often happen within 30 minutes of eating a food.  Your symptoms have not gone away within 2 days or are getting worse.  You have new symptoms.  You want to retest  yourself with a food or drink you think causes an allergic reaction. Only do this under the care of a doctor. MAKE SURE YOU:   Understand these instructions.  Will watch your condition.  Will get help right away if you are not doing well or get worse. Document Released: 10/10/2012 Document Reviewed: 10/10/2012 [facility] Patient Information 2015 [place]. This information is not intended to replace advice given to you by your health care provider. Make sure you discuss any questions you have with your health care provider.  Hives Hives are itchy, red, swollen areas of the skin. They can vary in size and location on your body. Hives can come and go for hours or several days (acute hives) or for several weeks (chronic hives). Hives do not spread from person to person (noncontagious). They may get worse with scratching, exercise, and emotional stress. CAUSES   Allergic reaction to food, additives, or drugs.  Infections, including the common cold.  Illness, such as vasculitis, lupus, or thyroid disease.  Exposure to sunlight, heat, or cold.  Exercise.  Stress.  Contact with chemicals. SYMPTOMS   Red or white swollen patches on the skin. The patches may change size, shape, and location quickly and repeatedly.  Itching.  Swelling of the hands, feet, and face. This may occur if hives develop deeper in the skin. DIAGNOSIS  Your caregiver can usually tell what is wrong by performing a physical exam. Skin  or blood tests may also be done to determine the cause of your hives. In some cases, the cause cannot be determined. TREATMENT  Mild cases usually get better with medicines such as antihistamines. Severe cases may require an emergency epinephrine injection. If the cause of your hives is known, treatment includes avoiding that trigger.  HOME CARE INSTRUCTIONS   Avoid causes that trigger your hives.  Take antihistamines as directed by your caregiver to reduce the severity of your  hives. Non-sedating or low-sedating antihistamines are usually recommended. Do not drive while taking an antihistamine.  Take any other medicines prescribed for itching as directed by your caregiver.  Wear loose-fitting clothing.  Keep all follow-up appointments as directed by your caregiver. SEEK MEDICAL CARE IF:   You have persistent or severe itching that is not relieved with medicine.  You have painful or swollen joints. SEEK IMMEDIATE MEDICAL CARE IF:   You have a fever.  Your tongue or lips are swollen.  You have trouble breathing or swallowing.  You feel tightness in the throat or chest.  You have abdominal pain. These problems may be the first sign of a life-threatening allergic reaction. Call your local emergency services (911 in [place] MAKE SURE YOU:   Understand these instructions.  Will watch your condition.  Will get help right away if you are not doing well or get worse. Document Released: 06/15/2005 Document Revised: 06/20/2013 Document Reviewed: 09/08/2011 [facility] Patient Information 2015 [place], [place]. This information is not intended to replace advice given to you by your health care provider. Make sure you discuss any questions you have with your health care provider.
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
## 2015-02-18 ENCOUNTER — Encounter: Payer: Self-pay | Admitting: Family Medicine

## 2015-02-18 ENCOUNTER — Ambulatory Visit (INDEPENDENT_AMBULATORY_CARE_PROVIDER_SITE_OTHER): Payer: Medicare Other | Admitting: Family Medicine

## 2015-02-18 VITALS — BP 138/74 | HR 60 | Temp 97.8°F | Resp 20 | Ht 70.0 in | Wt 206.0 lb

## 2015-02-18 DIAGNOSIS — L500 Allergic urticaria: Secondary | ICD-10-CM

## 2015-02-18 DIAGNOSIS — I2510 Atherosclerotic heart disease of native coronary artery without angina pectoris: Secondary | ICD-10-CM

## 2015-02-18 MED ORDER — VARENICLINE TARTRATE 0.5 MG X 11 & 1 MG X 42 PO MISC: ORAL | Status: DC
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
## 2015-03-05 ENCOUNTER — Telehealth: Payer: Self-pay | Admitting: *Deleted

## 2015-03-05 MED ORDER — OXYCODONE-ACETAMINOPHEN 10-325 MG PO TABS: 1.0000 | ORAL_TABLET | ORAL | Status: DC | PRN

## 2015-03-05 NOTE — Telephone Encounter (Signed)
Pt calling to get RX on oxycodone  Please call [phone] when ready

## 2015-03-05 NOTE — Telephone Encounter (Signed)
?   OK to Refill  

## 2015-03-05 NOTE — Telephone Encounter (Signed)
ok 

## 2015-03-05 NOTE — Telephone Encounter (Signed)
Script printed,ready for provider signature 

## 2015-03-22 ENCOUNTER — Other Ambulatory Visit: Payer: Self-pay | Admitting: Family Medicine

## 2015-03-22 MED ORDER — VARENICLINE TARTRATE 1 MG PO TABS: 1.0000 mg | ORAL_TABLET | Freq: Two times a day (BID) | ORAL | Status: DC

## 2015-03-22 NOTE — Telephone Encounter (Signed)
**Note De-identified Everardo Voris Obfuscation** Prescription sent to pharmacy.

## 2015-03-22 NOTE — Telephone Encounter (Signed)
Ok but for continuing month pack x 2

## 2015-03-22 NOTE — Telephone Encounter (Signed)
Ok to send continuing month pack?

## 2015-04-05 ENCOUNTER — Telehealth: Payer: Self-pay | Admitting: Family Medicine

## 2015-04-05 MED ORDER — OXYCODONE-ACETAMINOPHEN 10-325 MG PO TABS
1.0000 | ORAL_TABLET | ORAL | Status: DC | PRN
Start: 2015-04-05 — End: 2015-05-06

## 2015-04-05 NOTE — Telephone Encounter (Signed)
Pt is calling for a refill of Percocet.  [phone]

## 2015-04-05 NOTE — Telephone Encounter (Signed)
RX printed, left up front and patient aware to pick up  

## 2015-04-05 NOTE — Telephone Encounter (Signed)
ok 

## 2015-04-05 NOTE — Telephone Encounter (Signed)
?   OK to Refill  

## 2015-04-10 ENCOUNTER — Encounter: Payer: Self-pay | Admitting: Internal Medicine

## 2015-04-23 ENCOUNTER — Other Ambulatory Visit: Payer: Self-pay | Admitting: Family Medicine

## 2015-04-23 NOTE — Telephone Encounter (Signed)
Medication refilled per protocol. 

## 2015-05-06 ENCOUNTER — Ambulatory Visit (INDEPENDENT_AMBULATORY_CARE_PROVIDER_SITE_OTHER): Payer: Medicare Other | Admitting: Family Medicine

## 2015-05-06 ENCOUNTER — Other Ambulatory Visit: Payer: Self-pay | Admitting: Family Medicine

## 2015-05-06 DIAGNOSIS — Z23 Encounter for immunization: Secondary | ICD-10-CM | POA: Diagnosis not present

## 2015-05-06 MED ORDER — OXYCODONE-ACETAMINOPHEN 10-325 MG PO TABS: 1.0000 | ORAL_TABLET | ORAL | Status: DC | PRN

## 2015-05-06 NOTE — Telephone Encounter (Signed)
ok 

## 2015-05-06 NOTE — Telephone Encounter (Signed)
Patient calling for rx for his percocet  [phone]

## 2015-05-06 NOTE — Telephone Encounter (Signed)
LRF 04/05/15  #180.  LOV 02/18/15  OK refill?

## 2015-05-06 NOTE — Telephone Encounter (Signed)
Pt aware ready for pick up 

## 2015-05-10 ENCOUNTER — Encounter: Payer: Self-pay | Admitting: Internal Medicine

## 2015-05-22 ENCOUNTER — Other Ambulatory Visit: Payer: Self-pay | Admitting: Internal Medicine

## 2015-05-22 DIAGNOSIS — I6523 Occlusion and stenosis of bilateral carotid arteries: Secondary | ICD-10-CM

## 2015-05-24 ENCOUNTER — Ambulatory Visit: Payer: Medicare Other | Admitting: Internal Medicine

## 2015-05-27 ENCOUNTER — Ambulatory Visit (HOSPITAL_COMMUNITY)
Admission: RE | Admit: 2015-05-27 | Discharge: 2015-05-27 | Disposition: A | Discharge status: Home / Self Care | Payer: Medicare Other | Source: Ambulatory Visit | Attending: Cardiology | Admitting: Cardiology

## 2015-05-27 DIAGNOSIS — I6523 Occlusion and stenosis of bilateral carotid arteries: Secondary | ICD-10-CM | POA: Insufficient documentation

## 2015-05-27 DIAGNOSIS — E785 Hyperlipidemia, unspecified: Secondary | ICD-10-CM | POA: Diagnosis not present

## 2015-05-27 DIAGNOSIS — I10 Essential (primary) hypertension: Secondary | ICD-10-CM | POA: Diagnosis not present

## 2015-05-28 ENCOUNTER — Telehealth: Payer: Self-pay

## 2015-05-28 DIAGNOSIS — I6523 Occlusion and stenosis of bilateral carotid arteries: Secondary | ICD-10-CM

## 2015-05-28 NOTE — Telephone Encounter (Signed)
Informed patient of results and verbal understanding expressed.  Repeat carotids ordered to be scheduled in 1 year. Patient agrees with treatment plan. 

## 2015-05-28 NOTE — Telephone Encounter (Signed)
-----   Message from Fay Records, MD sent at 05/27/2015 10:02 PM EST ----- Moderate plaquing of the carotids F/U in 1 year

## 2015-06-04 DIAGNOSIS — Z8551 Personal history of malignant neoplasm of bladder: Secondary | ICD-10-CM | POA: Diagnosis not present

## 2015-06-04 DIAGNOSIS — N401 Enlarged prostate with lower urinary tract symptoms: Secondary | ICD-10-CM | POA: Diagnosis not present

## 2015-06-04 DIAGNOSIS — N138 Other obstructive and reflux uropathy: Secondary | ICD-10-CM | POA: Diagnosis not present

## 2015-06-04 DIAGNOSIS — Z125 Encounter for screening for malignant neoplasm of prostate: Secondary | ICD-10-CM | POA: Diagnosis not present

## 2015-06-06 ENCOUNTER — Other Ambulatory Visit: Payer: Self-pay | Admitting: *Deleted

## 2015-06-06 NOTE — Telephone Encounter (Signed)
ok 

## 2015-06-06 NOTE — Telephone Encounter (Signed)
Pt calling rquesting refill on percocet  Last rf:05/06/15  Last ov: 02/18/15

## 2015-06-07 MED ORDER — OXYCODONE-ACETAMINOPHEN 10-325 MG PO TABS: 1.0000 | ORAL_TABLET | ORAL | Status: DC | PRN

## 2015-06-07 NOTE — Telephone Encounter (Signed)
RX printed, left up front and patient aware to pick up  

## 2015-07-09 ENCOUNTER — Encounter: Payer: Self-pay | Admitting: *Deleted

## 2015-07-09 ENCOUNTER — Telehealth: Payer: Self-pay | Admitting: *Deleted

## 2015-07-09 MED ORDER — OXYCODONE-ACETAMINOPHEN 10-325 MG PO TABS: 1.0000 | ORAL_TABLET | ORAL | Status: DC | PRN

## 2015-07-09 NOTE — Telephone Encounter (Signed)
Pt calling needs refill on oxyCODONE-acetaminophen (PERCOCET) 10-325 MG tablet  LRF:06/07/15  LOV: 02/18/2015

## 2015-07-09 NOTE — Telephone Encounter (Signed)
This encounter was created in error - please disregard.

## 2015-07-09 NOTE — Telephone Encounter (Signed)
ok 

## 2015-07-09 NOTE — Telephone Encounter (Signed)
Script printed,ready for provider signature 

## 2015-07-11 ENCOUNTER — Ambulatory Visit (INDEPENDENT_AMBULATORY_CARE_PROVIDER_SITE_OTHER): Payer: Medicare Other | Admitting: Internal Medicine

## 2015-07-11 ENCOUNTER — Encounter: Payer: Self-pay | Admitting: Internal Medicine

## 2015-07-11 VITALS — BP 140/80 | HR 72 | Ht 70.5 in | Wt 210.0 lb

## 2015-07-11 DIAGNOSIS — E785 Hyperlipidemia, unspecified: Secondary | ICD-10-CM | POA: Diagnosis not present

## 2015-07-11 DIAGNOSIS — I10 Essential (primary) hypertension: Secondary | ICD-10-CM | POA: Diagnosis not present

## 2015-07-11 LAB — LIPID PANEL
CHOL/HDL RATIO: 4.6 ratio (ref <=5.0)
Cholesterol: 167 mg/dL (ref 125–200)
HDL: 36 mg/dL — ABNORMAL LOW (ref >=40)
LDL CALC: 104 mg/dL (ref <130)
Triglycerides: 136 mg/dL (ref <150)
VLDL: 27 mg/dL (ref <30)

## 2015-07-11 LAB — CBC
HCT: 43.7 % (ref 39.0–52.0)
HEMOGLOBIN: 15.3 g/dL (ref 13.0–17.0)
MCH: 31.7 pg (ref 26.0–34.0)
MCHC: 35 g/dL (ref 30.0–36.0)
MCV: 90.5 fL (ref 78.0–100.0)
MPV: 10.2 fL (ref 8.6–12.4)
Platelets: 201 10*3/uL (ref 150–400)
RBC: 4.83 MIL/uL (ref 4.22–5.81)
RDW: 14.3 % (ref 11.5–15.5)
WBC: 6.9 10*3/uL (ref 4.0–10.5)

## 2015-07-11 LAB — BASIC METABOLIC PANEL
BUN: 14 mg/dL (ref 7–25)
CHLORIDE: 100 mmol/L (ref 98–110)
CO2: 26 mmol/L (ref 20–31)
Calcium: 9.6 mg/dL (ref 8.6–10.3)
Creat: 0.73 mg/dL (ref 0.70–1.18)
GLUCOSE: 124 mg/dL — AB (ref 65–99)
POTASSIUM: 3.9 mmol/L (ref 3.5–5.3)
SODIUM: 136 mmol/L (ref 135–146)
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
## 2015-07-11 NOTE — Patient Instructions (Signed)
Your physician recommends that you continue on your current medications as directed. Please refer to the Current Medication list given to you today. Your physician recommends that you return for lab work today (lipids, cbc, bmet)  Your physician wants you to follow-up in: 1 year with Dr. Harrington Challenger.  You will receive a reminder letter in the mail two months in advance. If you don't receive a letter, please call our office to schedule the follow-up appointment.

## 2015-07-18 ENCOUNTER — Telehealth: Payer: Self-pay | Admitting: Internal Medicine

## 2015-07-18 NOTE — Telephone Encounter (Signed)
Patient was informed of lab results

## 2015-07-18 NOTE — Telephone Encounter (Signed)
New message ° ° ° ° ° ° °Calling to get lab results °

## 2015-07-19 ENCOUNTER — Telehealth: Payer: Self-pay | Admitting: Internal Medicine

## 2015-07-19 NOTE — Telephone Encounter (Signed)
Calling stating someone left message for him to call the office.  Advised that Michalene spoke with him yesterday regarding his lab results.  He states he does remember the call and may not have erased message from previous day. Reviewed labs with him again and he will work on his diet.

## 2015-07-19 NOTE — Telephone Encounter (Signed)
Follow up  ° ° ° °Patient returning call back to nurse from yesterday  °

## 2015-08-05 ENCOUNTER — Other Ambulatory Visit: Payer: Self-pay | Admitting: Family Medicine

## 2015-08-05 NOTE — Telephone Encounter (Signed)
Refill appropriate and filled per protocol. 

## 2015-08-09 ENCOUNTER — Telehealth: Payer: Self-pay | Admitting: Family Medicine

## 2015-08-09 MED ORDER — OXYCODONE-ACETAMINOPHEN 10-325 MG PO TABS: 1.0000 | ORAL_TABLET | ORAL | Status: DC | PRN

## 2015-08-09 NOTE — Telephone Encounter (Signed)
ok 

## 2015-08-09 NOTE — Telephone Encounter (Signed)
?   OK to Refill  

## 2015-08-09 NOTE — Telephone Encounter (Signed)
Pt is requesting a refill of Percocet. [phone]

## 2015-08-09 NOTE — Telephone Encounter (Signed)
RX printed, left up front and patient aware to pick up after 2  

## 2015-08-25 ENCOUNTER — Other Ambulatory Visit: Payer: Self-pay | Admitting: Family Medicine

## 2015-09-10 ENCOUNTER — Telehealth: Payer: Self-pay | Admitting: Family Medicine

## 2015-09-10 ENCOUNTER — Other Ambulatory Visit: Payer: Self-pay | Admitting: Family Medicine

## 2015-09-10 MED ORDER — OXYCODONE-ACETAMINOPHEN 10-325 MG PO TABS: 1.0000 | ORAL_TABLET | ORAL | Status: DC | PRN

## 2015-09-10 NOTE — Telephone Encounter (Signed)
ok 

## 2015-09-10 NOTE — Telephone Encounter (Signed)
Refill appropriate and filled per protocol. 

## 2015-09-10 NOTE — Telephone Encounter (Signed)
?   OK to Refill  

## 2015-09-10 NOTE — Telephone Encounter (Signed)
RX printed, left up front and patient aware to pick up  

## 2015-09-10 NOTE — Telephone Encounter (Signed)
Patient requesting rx for his percocet   [phone]

## 2015-10-11 ENCOUNTER — Telehealth: Payer: Self-pay | Admitting: Family Medicine

## 2015-10-11 MED ORDER — OXYCODONE-ACETAMINOPHEN 10-325 MG PO TABS: 1.0000 | ORAL_TABLET | ORAL | Status: DC | PRN

## 2015-10-11 NOTE — Telephone Encounter (Signed)
ok 

## 2015-10-11 NOTE — Telephone Encounter (Signed)
RX printed, left up front and patient aware to pick up after 2 pm  

## 2015-10-11 NOTE — Telephone Encounter (Signed)
?   OK to Refill  

## 2015-10-11 NOTE — Telephone Encounter (Signed)
Patient calling to get rx for his percocet   [phone]

## 2015-11-01 ENCOUNTER — Ambulatory Visit (INDEPENDENT_AMBULATORY_CARE_PROVIDER_SITE_OTHER): Payer: Medicare Other | Admitting: Family Medicine

## 2015-11-01 ENCOUNTER — Other Ambulatory Visit: Payer: Self-pay | Admitting: Family Medicine

## 2015-11-01 ENCOUNTER — Encounter: Payer: Self-pay | Admitting: Family Medicine

## 2015-11-01 VITALS — BP 130/70 | HR 78 | Temp 97.9°F | Resp 20 | Ht 70.0 in | Wt 204.0 lb

## 2015-11-01 DIAGNOSIS — N400 Enlarged prostate without lower urinary tract symptoms: Secondary | ICD-10-CM

## 2015-11-01 DIAGNOSIS — R350 Frequency of micturition: Secondary | ICD-10-CM | POA: Diagnosis not present

## 2015-11-01 LAB — URINALYSIS, ROUTINE W REFLEX MICROSCOPIC
Bilirubin Urine: NEGATIVE
Glucose, UA: NEGATIVE
Hgb urine dipstick: NEGATIVE
KETONES UR: NEGATIVE
NITRITE: NEGATIVE
SPECIFIC GRAVITY, URINE: 1.02 (ref 1.001–1.035)
pH: 7 (ref 5.0–8.0)

## 2015-11-01 LAB — COMPLETE METABOLIC PANEL WITH GFR
ALT: 38 U/L (ref 9–46)
AST: 51 U/L — ABNORMAL HIGH (ref 10–35)
Albumin: 4.1 g/dL (ref 3.6–5.1)
Alkaline Phosphatase: 67 U/L (ref 40–115)
BILIRUBIN TOTAL: 0.9 mg/dL (ref 0.2–1.2)
BUN: 13 mg/dL (ref 7–25)
CO2: 24 mmol/L (ref 20–31)
Calcium: 9.2 mg/dL (ref 8.6–10.3)
Chloride: 98 mmol/L (ref 98–110)
Creat: 0.85 mg/dL (ref 0.70–1.18)
GFR, Est African American: >89 mL/min (ref >=60)
GFR, Est Non African American: 88 mL/min (ref >=60)
Glucose, Bld: 114 mg/dL — ABNORMAL HIGH (ref 70–99)
Potassium: 3.9 mmol/L (ref 3.5–5.3)
Sodium: 135 mmol/L (ref 135–146)
TOTAL PROTEIN: 7.6 g/dL (ref 6.1–8.1)

## 2015-11-01 LAB — URINALYSIS, MICROSCOPIC ONLY
BACTERIA UA: NONE SEEN [HPF]
CASTS: NONE SEEN [LPF]
CRYSTALS: NONE SEEN [HPF]
YEAST: NONE SEEN [HPF]

## 2015-11-01 MED ORDER — CIPROFLOXACIN HCL 500 MG PO TABS: 500.0000 mg | ORAL_TABLET | Freq: Two times a day (BID) | ORAL | Status: DC

## 2015-11-01 MED ORDER — TAMSULOSIN HCL 0.4 MG PO CAPS: 0.4000 mg | ORAL_CAPSULE | Freq: Every day | ORAL | Status: DC

## 2015-11-01 NOTE — Telephone Encounter (Signed)
Refill appropriate and filled per protocol. 
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
## 2015-11-02 LAB — PSA: PSA: 2.02 ng/mL (ref <=4.00)

## 2015-11-09 ENCOUNTER — Other Ambulatory Visit: Payer: Self-pay | Admitting: Family Medicine

## 2015-11-11 ENCOUNTER — Telehealth: Payer: Self-pay | Admitting: Family Medicine

## 2015-11-11 MED ORDER — OXYCODONE-ACETAMINOPHEN 10-325 MG PO TABS: 1.0000 | ORAL_TABLET | ORAL | Status: DC | PRN

## 2015-11-11 NOTE — Telephone Encounter (Signed)
ok 

## 2015-11-11 NOTE — Telephone Encounter (Signed)
RX printed, left up front and patient aware to pick up  

## 2015-11-11 NOTE — Telephone Encounter (Signed)
Ok to refill 

## 2015-11-11 NOTE — Telephone Encounter (Signed)
Pt is requesting a refill of Oxycodone 10-325 mg [phone]

## 2015-11-15 ENCOUNTER — Encounter: Payer: Self-pay | Admitting: Family Medicine

## 2015-11-15 ENCOUNTER — Ambulatory Visit (INDEPENDENT_AMBULATORY_CARE_PROVIDER_SITE_OTHER): Payer: Medicare Other | Admitting: Family Medicine

## 2015-11-15 VITALS — BP 160/78 | HR 84 | Temp 97.5°F | Resp 16 | Wt 205.0 lb

## 2015-11-15 DIAGNOSIS — R7303 Prediabetes: Secondary | ICD-10-CM

## 2015-11-15 DIAGNOSIS — Z1159 Encounter for screening for other viral diseases: Secondary | ICD-10-CM

## 2015-11-15 DIAGNOSIS — I10 Essential (primary) hypertension: Secondary | ICD-10-CM

## 2015-11-15 DIAGNOSIS — I2510 Atherosclerotic heart disease of native coronary artery without angina pectoris: Secondary | ICD-10-CM

## 2015-11-15 DIAGNOSIS — G4710 Hypersomnia, unspecified: Secondary | ICD-10-CM | POA: Diagnosis not present

## 2015-11-15 DIAGNOSIS — Z0000 Encounter for general adult medical examination without abnormal findings: Secondary | ICD-10-CM | POA: Diagnosis not present

## 2015-11-15 DIAGNOSIS — R5382 Chronic fatigue, unspecified: Secondary | ICD-10-CM

## 2015-11-15 MED ORDER — METOPROLOL SUCCINATE ER 50 MG PO TB24: 50.0000 mg | ORAL_TABLET | Freq: Every day | ORAL | Status: DC
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
## 2015-11-20 ENCOUNTER — Other Ambulatory Visit: Payer: Medicare Other

## 2015-11-20 DIAGNOSIS — I2510 Atherosclerotic heart disease of native coronary artery without angina pectoris: Secondary | ICD-10-CM | POA: Diagnosis not present

## 2015-11-20 DIAGNOSIS — R7303 Prediabetes: Secondary | ICD-10-CM | POA: Diagnosis not present

## 2015-11-20 DIAGNOSIS — R7309 Other abnormal glucose: Secondary | ICD-10-CM | POA: Diagnosis not present

## 2015-11-20 DIAGNOSIS — Z1159 Encounter for screening for other viral diseases: Secondary | ICD-10-CM | POA: Diagnosis not present

## 2015-11-20 LAB — LIPID PANEL
CHOL/HDL RATIO: 4.9 ratio (ref <=5.0)
Cholesterol: 157 mg/dL (ref 125–200)
HDL: 32 mg/dL — AB (ref >=40)
LDL CALC: 94 mg/dL (ref <130)
Triglycerides: 154 mg/dL — ABNORMAL HIGH (ref <150)
VLDL: 31 mg/dL — ABNORMAL HIGH (ref <30)

## 2015-11-20 LAB — HEMOGLOBIN A1C
HEMOGLOBIN A1C: 6.8 % — AB (ref <5.7)
Mean Plasma Glucose: 148 mg/dL

## 2015-11-21 LAB — MICROALBUMIN, URINE: MICROALB UR: 6.3 mg/dL

## 2015-11-21 LAB — HEPATITIS C ANTIBODY: HCV AB: NEGATIVE

## 2015-12-09 DIAGNOSIS — H2511 Age-related nuclear cataract, right eye: Secondary | ICD-10-CM | POA: Diagnosis not present

## 2015-12-10 ENCOUNTER — Other Ambulatory Visit: Payer: Self-pay | Admitting: Family Medicine

## 2015-12-12 ENCOUNTER — Telehealth: Payer: Self-pay | Admitting: Family Medicine

## 2015-12-12 MED ORDER — OXYCODONE-ACETAMINOPHEN 10-325 MG PO TABS: 1.0000 | ORAL_TABLET | ORAL | Status: DC | PRN

## 2015-12-12 NOTE — Telephone Encounter (Signed)
ok 

## 2015-12-12 NOTE — Telephone Encounter (Signed)
Ok to refill 

## 2015-12-12 NOTE — Telephone Encounter (Signed)
Patient needs rx for percocet  [phone]

## 2015-12-13 NOTE — Telephone Encounter (Signed)
RX printed, left up front and patient aware to pick up  

## 2016-01-01 DIAGNOSIS — H2511 Age-related nuclear cataract, right eye: Secondary | ICD-10-CM | POA: Diagnosis not present

## 2016-01-14 ENCOUNTER — Telehealth: Payer: Self-pay | Admitting: Family Medicine

## 2016-01-14 MED ORDER — OXYCODONE-ACETAMINOPHEN 10-325 MG PO TABS
1.0000 | ORAL_TABLET | ORAL | Status: DC | PRN
Start: 2016-01-14 — End: 2016-02-17

## 2016-01-14 NOTE — Telephone Encounter (Signed)
Patient needs rx for his hydrocodone  [phone]

## 2016-01-14 NOTE — Telephone Encounter (Signed)
ok 

## 2016-01-14 NOTE — Telephone Encounter (Signed)
Ok to refill??      Oxycodone

## 2016-01-14 NOTE — Telephone Encounter (Signed)
RX printed, left up front and patient aware to pick up in am via vm

## 2016-02-16 ENCOUNTER — Other Ambulatory Visit: Payer: Self-pay | Admitting: Adult Health

## 2016-02-17 ENCOUNTER — Telehealth: Payer: Self-pay | Admitting: *Deleted

## 2016-02-17 MED ORDER — OXYCODONE-ACETAMINOPHEN 10-325 MG PO TABS: 1.0000 | ORAL_TABLET | ORAL | 0 refills | Status: DC | PRN

## 2016-02-17 NOTE — Telephone Encounter (Signed)
Received call from patient.   Requested refill on Percocet.   Ok to refill??  Last office visit 11/15/2015.  Last refill 01/14/2016.

## 2016-02-17 NOTE — Telephone Encounter (Signed)
Prescription printed and patient made aware to come to office to pick up on 02/18/2016.

## 2016-02-17 NOTE — Telephone Encounter (Signed)
ok 

## 2016-02-18 ENCOUNTER — Telehealth: Payer: Self-pay | Admitting: *Deleted

## 2016-02-18 MED ORDER — OXYCODONE-ACETAMINOPHEN 10-325 MG PO TABS: 1.0000 | ORAL_TABLET | ORAL | 0 refills | Status: DC | PRN

## 2016-02-18 NOTE — Telephone Encounter (Signed)
Patient in office to pick up prescription printed on 02/17/2016.  Prescription was placed at front desk, but was unable to be located at this time.   New prescription printed and given to patient.

## 2016-02-22 ENCOUNTER — Other Ambulatory Visit: Payer: Self-pay | Admitting: Family Medicine

## 2016-02-25 ENCOUNTER — Other Ambulatory Visit: Payer: Self-pay

## 2016-03-21 ENCOUNTER — Telehealth: Payer: Self-pay | Admitting: Family Medicine

## 2016-03-21 MED ORDER — OXYCODONE-ACETAMINOPHEN 10-325 MG PO TABS
1.0000 | ORAL_TABLET | ORAL | 0 refills | Status: DC | PRN
Start: 2016-03-21 — End: 2016-04-24

## 2016-03-21 NOTE — Telephone Encounter (Signed)
Requesting a refill on Percocet - Ok to refill??

## 2016-03-23 NOTE — Telephone Encounter (Signed)
RX printed, left up front and patient aware to pick up  

## 2016-03-23 NOTE — Telephone Encounter (Signed)
ok 

## 2016-04-10 ENCOUNTER — Other Ambulatory Visit: Payer: Self-pay | Admitting: Family Medicine

## 2016-04-23 ENCOUNTER — Other Ambulatory Visit: Payer: Self-pay | Admitting: Family Medicine

## 2016-04-23 NOTE — Telephone Encounter (Signed)
Requesting refill on Percocet - Ok to refill??

## 2016-04-24 MED ORDER — OXYCODONE-ACETAMINOPHEN 10-325 MG PO TABS: 1.0000 | ORAL_TABLET | ORAL | 0 refills | Status: DC | PRN

## 2016-04-24 NOTE — Telephone Encounter (Signed)
rx printed and pt told to pick up after 2PM today

## 2016-04-24 NOTE — Telephone Encounter (Signed)
ok 

## 2016-05-25 ENCOUNTER — Telehealth: Payer: Self-pay | Admitting: Family Medicine

## 2016-05-25 NOTE — Telephone Encounter (Signed)
Requesting refill on oxycodone - Ok to refill??

## 2016-05-26 ENCOUNTER — Ambulatory Visit (INDEPENDENT_AMBULATORY_CARE_PROVIDER_SITE_OTHER): Payer: Medicare Other

## 2016-05-26 DIAGNOSIS — Z23 Encounter for immunization: Secondary | ICD-10-CM

## 2016-05-26 MED ORDER — OXYCODONE-ACETAMINOPHEN 10-325 MG PO TABS: 1.0000 | ORAL_TABLET | ORAL | 0 refills | Status: DC | PRN

## 2016-05-26 NOTE — Telephone Encounter (Signed)
RX printed, left up front and patient aware to pick up  

## 2016-05-26 NOTE — Telephone Encounter (Signed)
ok 

## 2016-06-10 ENCOUNTER — Other Ambulatory Visit: Payer: Self-pay | Admitting: Internal Medicine

## 2016-06-10 ENCOUNTER — Other Ambulatory Visit: Payer: Self-pay

## 2016-06-10 DIAGNOSIS — I6523 Occlusion and stenosis of bilateral carotid arteries: Secondary | ICD-10-CM

## 2016-06-10 DIAGNOSIS — I10 Essential (primary) hypertension: Secondary | ICD-10-CM | POA: Diagnosis not present

## 2016-06-10 DIAGNOSIS — Z1211 Encounter for screening for malignant neoplasm of colon: Secondary | ICD-10-CM | POA: Diagnosis not present

## 2016-06-10 DIAGNOSIS — E785 Hyperlipidemia, unspecified: Secondary | ICD-10-CM | POA: Diagnosis not present

## 2016-06-11 ENCOUNTER — Ambulatory Visit (HOSPITAL_COMMUNITY)
Admission: RE | Admit: 2016-06-11 | Discharge: 2016-06-11 | Disposition: A | Discharge status: Home / Self Care | Payer: Medicare Other | Source: Ambulatory Visit | Attending: Cardiology | Admitting: Cardiology

## 2016-06-11 DIAGNOSIS — I6523 Occlusion and stenosis of bilateral carotid arteries: Secondary | ICD-10-CM | POA: Insufficient documentation

## 2016-06-15 DIAGNOSIS — Z8551 Personal history of malignant neoplasm of bladder: Secondary | ICD-10-CM | POA: Diagnosis not present

## 2016-06-18 ENCOUNTER — Telehealth: Payer: Self-pay | Admitting: Internal Medicine

## 2016-06-18 NOTE — Telephone Encounter (Signed)
Request for surgical clearance:  1. What type of surgery is being performed? Colonoscopy   2. When is this surgery scheduled? 06/25/16   3. Are there any medications that need to be held prior to surgery and how long? Plavix 5 days prior   4. Name of physician performing surgery? Dr. Benson Norway   5. What is your office phone and fax number? Ph [phone] Fax [phone] Annye Asa

## 2016-06-19 ENCOUNTER — Other Ambulatory Visit: Payer: Self-pay | Admitting: Gastroenterology

## 2016-06-23 NOTE — Telephone Encounter (Signed)
Reviewed with Richardson Dopp, PA and pt may stop Plavix 5 days prior to colonoscopy.  Will fax this note to [facility]

## 2016-06-23 NOTE — Telephone Encounter (Signed)
Just saw note   OK to stop for procedure though should be 5 days

## 2016-06-24 ENCOUNTER — Encounter (HOSPITAL_COMMUNITY): Payer: Self-pay

## 2016-06-24 ENCOUNTER — Telehealth: Payer: Self-pay | Admitting: Family Medicine

## 2016-06-24 MED ORDER — OXYCODONE-ACETAMINOPHEN 10-325 MG PO TABS: 1.0000 | ORAL_TABLET | ORAL | 0 refills | Status: DC | PRN

## 2016-06-24 NOTE — Telephone Encounter (Signed)
Requesting a refill on Percocet - Ok to refill??

## 2016-06-24 NOTE — Telephone Encounter (Signed)
ok 

## 2016-06-25 NOTE — Telephone Encounter (Signed)
RX printed, left up front and patient aware to pick up  

## 2016-06-26 ENCOUNTER — Encounter (HOSPITAL_COMMUNITY): Payer: Self-pay

## 2016-07-02 NOTE — Anesthesia Preprocedure Evaluation (Addendum)
Anesthesia Evaluation  Patient identified by MRN, date of birth, ID band Patient awake    Reviewed: Allergy & Precautions, NPO status , Patient's Chart, lab work & pertinent test results  History of Anesthesia Complications Negative for: history of anesthetic complications  Airway Mallampati: II  TM Distance: >3 FB Neck ROM: Full    Dental  (+) Poor Dentition, Dental Advisory Given   Pulmonary neg pulmonary ROS, COPD, Current Smoker,    Pulmonary exam normal        Cardiovascular hypertension, Pt. on home beta blockers + CAD and + Peripheral Vascular Disease  negative cardio ROS Normal cardiovascular exam     Neuro/Psych negative neurological ROS  negative psych ROS   GI/Hepatic negative GI ROS, Neg liver ROS,   Endo/Other  negative endocrine ROS  Renal/GU negative Renal ROS     Musculoskeletal negative musculoskeletal ROS (+)   Abdominal   Peds  Hematology negative hematology ROS (+)   Anesthesia Other Findings Day of surgery medications reviewed with the patient.  Reproductive/Obstetrics                            Anesthesia Physical Anesthesia Plan  ASA: III  Anesthesia Plan: MAC   Post-op Pain Management:    Induction:   Airway Management Planned: Natural Airway and Simple Face Mask  Additional Equipment:   Intra-op Plan:   Post-operative Plan:   Informed Consent: I have reviewed the patients History and Physical, chart, labs and discussed the procedure including the risks, benefits and alternatives for the proposed anesthesia with the patient or authorized representative who has indicated his/her understanding and acceptance.   Dental advisory given  Plan Discussed with: CRNA and Anesthesiologist  Anesthesia Plan Comments:        Anesthesia Quick Evaluation

## 2016-07-03 ENCOUNTER — Ambulatory Visit (HOSPITAL_COMMUNITY)
Admission: RE | Admit: 2016-07-03 | Discharge: 2016-07-03 | Disposition: A | Discharge status: Home / Self Care | Payer: Medicare Other | Source: Ambulatory Visit | Attending: Gastroenterology | Admitting: Gastroenterology

## 2016-07-03 ENCOUNTER — Ambulatory Visit (HOSPITAL_COMMUNITY): Payer: Medicare Other | Admitting: Anesthesiology

## 2016-07-03 ENCOUNTER — Encounter (HOSPITAL_COMMUNITY): Payer: Self-pay

## 2016-07-03 ENCOUNTER — Encounter (HOSPITAL_COMMUNITY)
Admission: RE | Disposition: A | Discharge status: Home / Self Care | Payer: Self-pay | Source: Ambulatory Visit | Attending: Gastroenterology

## 2016-07-03 DIAGNOSIS — J449 Chronic obstructive pulmonary disease, unspecified: Secondary | ICD-10-CM | POA: Diagnosis not present

## 2016-07-03 DIAGNOSIS — Z86010 Personal history of colonic polyps: Secondary | ICD-10-CM | POA: Insufficient documentation

## 2016-07-03 DIAGNOSIS — I739 Peripheral vascular disease, unspecified: Secondary | ICD-10-CM | POA: Diagnosis not present

## 2016-07-03 DIAGNOSIS — D120 Benign neoplasm of cecum: Secondary | ICD-10-CM | POA: Diagnosis not present

## 2016-07-03 DIAGNOSIS — K5730 Diverticulosis of large intestine without perforation or abscess without bleeding: Secondary | ICD-10-CM | POA: Diagnosis not present

## 2016-07-03 DIAGNOSIS — I700 Atherosclerosis of aorta: Secondary | ICD-10-CM | POA: Insufficient documentation

## 2016-07-03 DIAGNOSIS — D122 Benign neoplasm of ascending colon: Secondary | ICD-10-CM | POA: Diagnosis not present

## 2016-07-03 DIAGNOSIS — Z885 Allergy status to narcotic agent status: Secondary | ICD-10-CM | POA: Insufficient documentation

## 2016-07-03 DIAGNOSIS — Z1211 Encounter for screening for malignant neoplasm of colon: Secondary | ICD-10-CM | POA: Insufficient documentation

## 2016-07-03 DIAGNOSIS — K635 Polyp of colon: Secondary | ICD-10-CM | POA: Diagnosis not present

## 2016-07-03 DIAGNOSIS — F17210 Nicotine dependence, cigarettes, uncomplicated: Secondary | ICD-10-CM | POA: Diagnosis not present

## 2016-07-03 DIAGNOSIS — Z8551 Personal history of malignant neoplasm of bladder: Secondary | ICD-10-CM | POA: Diagnosis not present

## 2016-07-03 DIAGNOSIS — D124 Benign neoplasm of descending colon: Secondary | ICD-10-CM | POA: Insufficient documentation

## 2016-07-03 DIAGNOSIS — I2510 Atherosclerotic heart disease of native coronary artery without angina pectoris: Secondary | ICD-10-CM | POA: Insufficient documentation

## 2016-07-03 DIAGNOSIS — I10 Essential (primary) hypertension: Secondary | ICD-10-CM | POA: Insufficient documentation

## 2016-07-03 DIAGNOSIS — M1990 Unspecified osteoarthritis, unspecified site: Secondary | ICD-10-CM | POA: Diagnosis not present

## 2016-07-03 DIAGNOSIS — D123 Benign neoplasm of transverse colon: Secondary | ICD-10-CM | POA: Insufficient documentation

## 2016-07-03 HISTORY — PX: COLONOSCOPY WITH PROPOFOL: SHX5780

## 2016-07-03 SURGERY — COLONOSCOPY WITH PROPOFOL
Anesthesia: Monitor Anesthesia Care

## 2016-07-03 MED ORDER — ONDANSETRON HCL 4 MG/2ML IJ SOLN
INTRAMUSCULAR | Status: DC | PRN
  Administered 2016-07-03: 4 mg via INTRAVENOUS

## 2016-07-03 MED ORDER — MIDAZOLAM HCL 2 MG/2ML IJ SOLN
INTRAMUSCULAR | Status: AC
  Filled 2016-07-03: qty 2

## 2016-07-03 MED ORDER — PROPOFOL 10 MG/ML IV BOLUS
INTRAVENOUS | Status: AC
Start: 2016-07-03 — End: 2016-07-03
  Filled 2016-07-03: qty 40

## 2016-07-03 MED ORDER — LIDOCAINE 2% (20 MG/ML) 5 ML SYRINGE
INTRAMUSCULAR | Status: DC | PRN
  Administered 2016-07-03: 60 mg via INTRAVENOUS

## 2016-07-03 MED ORDER — SODIUM CHLORIDE 0.9 % IV SOLN: INTRAVENOUS | Status: DC

## 2016-07-03 MED ORDER — LACTATED RINGERS IV SOLN
INTRAVENOUS | Status: DC | PRN
  Administered 2016-07-03: 08:00:00 via INTRAVENOUS

## 2016-07-03 MED ORDER — HYDROMORPHONE HCL 1 MG/ML IJ SOLN: 0.2500 mg | INTRAMUSCULAR | Status: DC | PRN

## 2016-07-03 MED ORDER — PROPOFOL 10 MG/ML IV BOLUS
INTRAVENOUS | Status: DC | PRN
  Administered 2016-07-03: 40 mg via INTRAVENOUS
  Administered 2016-07-03: 20 mg via INTRAVENOUS
  Administered 2016-07-03: 40 mg via INTRAVENOUS
  Administered 2016-07-03: 10 mg via INTRAVENOUS
  Administered 2016-07-03: 20 mg via INTRAVENOUS
  Administered 2016-07-03 (×2): 40 mg via INTRAVENOUS
  Administered 2016-07-03: 10 mg via INTRAVENOUS
  Administered 2016-07-03: 20 mg via INTRAVENOUS
  Administered 2016-07-03: 10 mg via INTRAVENOUS

## 2016-07-03 MED ORDER — PROMETHAZINE HCL 25 MG/ML IJ SOLN: 6.2500 mg | INTRAMUSCULAR | Status: DC | PRN

## 2016-07-03 MED ORDER — MIDAZOLAM HCL 2 MG/2ML IJ SOLN
INTRAMUSCULAR | Status: DC | PRN
  Administered 2016-07-03 (×2): 1 mg via INTRAVENOUS

## 2016-07-03 SURGICAL SUPPLY — 21 items

## 2016-07-03 NOTE — Transfer of Care (Signed)
Immediate Anesthesia Transfer of Care Note  Patient: [name]  Procedure(s) Performed: Procedure(s): COLONOSCOPY WITH PROPOFOL (N/A)  Patient Location: PACU  Anesthesia Type:MAC  Level of Consciousness:  sedated, patient cooperative and responds to stimulation  Airway & Oxygen Therapy:Patient Spontanous Breathing and Patient connected to face mask oxgen  Post-op Assessment:  Report given to PACU RN and Post -op Vital signs reviewed and stable  Post vital signs:  Reviewed and stable  Last Vitals:  Vitals:   07/03/16 0749  BP: 138/75  Pulse: 79  Resp: 17  Temp: 96.7 C    Complications: No apparent anesthesia complications

## 2016-07-03 NOTE — Op Note (Signed)
[facility] Patient Name: [name] Procedure Date: 07/03/2016 MRN: [id number] Attending MD: Carol Ada , MD Date of Birth: [birth year] CSN: [id number] Age: 71 Admit Type: Outpatient Procedure:                Colonoscopy Indications:              Screening for colorectal malignant neoplasm Providers:                Carol Ada, MD, Hilma Favors, RN, Ralene Bathe,                            Technician, Glenis Smoker, CRNA Referring MD:              Medicines:                Propofol per Anesthesia Complications:            No immediate complications. Estimated Blood Loss:     Estimated blood loss was minimal. Procedure:                Pre-Anesthesia Assessment:                           - Prior to the procedure, a History and Physical                            was performed, and patient medications and                            allergies were reviewed. The patient's tolerance of                            previous anesthesia was also reviewed. The risks                            and benefits of the procedure and the sedation                            options and risks were discussed with the patient.                            All questions were answered, and informed consent                            was obtained. Prior Anticoagulants: The patient has                            taken Plavix (clopidogrel), last dose was 5 days                            prior to procedure. ASA Grade Assessment: III - A                            patient with severe systemic disease. After  reviewing the risks and benefits, the patient was                            deemed in satisfactory condition to undergo the                            procedure.                           - Sedation was administered by an anesthesia                            professional. Deep sedation was attained.                           After obtaining informed consent, the  colonoscope                            was passed under direct vision. Throughout the                            procedure, the patient's blood pressure, pulse, and                            oxygen saturations were monitored continuously. The                            EC-[id number] ([id number]) scope was introduced through                            the anus and advanced to the the cecum, identified                            by appendiceal orifice and ileocecal valve. The                            colonoscopy was performed without difficulty. The                            patient tolerated the procedure well. The quality                            of the bowel preparation was good. The ileocecal                            valve, appendiceal orifice, and rectum were                            photographed. Scope In: 8:18:17 AM Scope Out: 8:43:04 AM Scope Withdrawal Time: 0 hours 20 minutes 2 seconds  Total Procedure Duration: 0 hours 24 minutes 47 seconds  Findings:      11 sessile polyps were found in the rectum, descending colon, transverse       colon, ascending colon and cecum. The polyps were 2 to 4 mm in size.  These polyps were removed with a cold snare. Resection and retrieval       were complete.      Scattered small and large-mouthed diverticula were found in the entire       colon. Impression:               - 11 2 to 4 mm polyps in the rectum, in the                            descending colon, in the transverse colon, in the                            ascending colon and in the cecum, removed with a                            cold snare. Resected and retrieved.                           - Diverticulosis in the entire examined colon. Moderate Sedation:      N/A- Per Anesthesia Care Recommendation:           - Patient has a contact number available for                            emergencies. The signs and symptoms of potential                            delayed  complications were discussed with the                            patient. Return to normal activities tomorrow.                            Written discharge instructions were provided to the                            patient.                           - Resume previous diet.                           - Continue present medications.                           - Await pathology results.                           - Repeat colonoscopy in 1 year for surveillance.                           - Resume Plavix today. Procedure Code(s):        --- Professional ---                           [phone], Colonoscopy, flexible; with removal of  tumor(s), polyp(s), or other lesion(s) by snare                            technique Diagnosis Code(s):        --- Professional ---                           K62.1, Rectal polyp                           Z12.11, Encounter for screening for malignant                            neoplasm of colon                           D12.4, Benign neoplasm of descending colon                           D12.3, Benign neoplasm of transverse colon (hepatic                            flexure or splenic flexure)                           D12.2, Benign neoplasm of ascending colon                           D12.0, Benign neoplasm of cecum                           K57.30, Diverticulosis of large intestine without                            perforation or abscess without bleeding CPT copyright 2016 [employer]. All rights reserved. The codes documented in this report are preliminary and upon coder review may  be revised to meet current compliance requirements. Carol Ada, MD Carol Ada, MD 07/03/2016 8:52:55 AM This report has been signed electronically. Number of Addenda: 0

## 2016-07-03 NOTE — Addendum Note (Signed)
Addendum  created 07/03/16 0948 by Duane Boston, MD   Sign clinical note

## 2016-07-03 NOTE — Anesthesia Postprocedure Evaluation (Addendum)
Anesthesia Post Note  Patient: [name]  Procedure(s) Performed: Procedure(s) (LRB): COLONOSCOPY WITH PROPOFOL (N/A)  Patient location during evaluation: Endoscopy Anesthesia Type: MAC Level of consciousness: awake and alert Pain management: pain level controlled Vital Signs Assessment: post-procedure vital signs reviewed and stable Respiratory status: spontaneous breathing and respiratory function stable Cardiovascular status: stable Anesthetic complications: no       Last Vitals:  Vitals:   07/03/16 0850 07/03/16 0920  BP: 121/74 122/83  Pulse:    Resp: 18   Temp:      Last Pain:  Vitals:   07/03/16 0749  TempSrc: Oral                 Floella Ensz DANIEL
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
## 2016-07-03 NOTE — Addendum Note (Signed)
Addendum  created 07/03/16 1210 by Duane Boston, MD   Sign clinical note

## 2016-07-03 NOTE — Discharge Instructions (Signed)

## 2016-07-05 ENCOUNTER — Other Ambulatory Visit: Payer: Self-pay | Admitting: Family Medicine

## 2016-07-06 ENCOUNTER — Encounter (HOSPITAL_COMMUNITY): Payer: Self-pay | Admitting: Gastroenterology

## 2016-07-20 ENCOUNTER — Ambulatory Visit (INDEPENDENT_AMBULATORY_CARE_PROVIDER_SITE_OTHER): Payer: Medicare Other | Admitting: Internal Medicine

## 2016-07-20 ENCOUNTER — Encounter: Payer: Self-pay | Admitting: Internal Medicine

## 2016-07-20 VITALS — BP 122/63 | HR 67 | Ht 70.0 in | Wt 211.1 lb

## 2016-07-20 DIAGNOSIS — E785 Hyperlipidemia, unspecified: Secondary | ICD-10-CM

## 2016-07-20 DIAGNOSIS — F17200 Nicotine dependence, unspecified, uncomplicated: Secondary | ICD-10-CM

## 2016-07-20 DIAGNOSIS — I10 Essential (primary) hypertension: Secondary | ICD-10-CM

## 2016-07-20 DIAGNOSIS — I2510 Atherosclerotic heart disease of native coronary artery without angina pectoris: Secondary | ICD-10-CM

## 2016-07-20 MED ORDER — BUPROPION HCL ER (SR) 150 MG PO TB12: ORAL_TABLET | ORAL | 0 refills | Status: DC
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
## 2016-07-20 NOTE — Patient Instructions (Signed)
Your physician has recommended you make the following change in your medication:  1.) start Wellbutrin 150 mg--1 tablet by mouth once a day for 3 days, then one tablet by mouth twice a day (every twelve hours)  Your physician recommends that you return for lab work today (CBC, BMET, [place])  Your physician wants you to follow-up in: October, 2018 [place].  You will receive a reminder letter in the mail two months in advance. If you don't receive a letter, please call our office to schedule the follow-up appointment.

## 2016-07-21 LAB — BASIC METABOLIC PANEL
BUN / CREAT RATIO: 26 — AB (ref 10–24)
BUN: 23 mg/dL (ref 8–27)
CO2: 27 mmol/L (ref 18–29)
CREATININE: 0.9 mg/dL (ref 0.76–1.27)
Calcium: 9.6 mg/dL (ref 8.6–10.2)
Chloride: 97 mmol/L (ref 96–106)
GFR calc Af Amer: 98 mL/min/{1.73_m2} (ref >59)
GFR, EST NON AFRICAN AMERICAN: 85 mL/min/{1.73_m2} (ref >59)
Glucose: 202 mg/dL — ABNORMAL HIGH (ref 65–99)
Potassium: 4.4 mmol/L (ref 3.5–5.2)
SODIUM: 139 mmol/L (ref 134–144)

## 2016-07-21 LAB — LIPID PANEL
CHOL/HDL RATIO: 6.7 ratio — AB (ref 0.0–5.0)
CHOLESTEROL TOTAL: 195 mg/dL (ref 100–199)
HDL: 29 mg/dL — ABNORMAL LOW (ref >39)
LDL Calculated: 109 mg/dL — ABNORMAL HIGH (ref 0–99)
TRIGLYCERIDES: 285 mg/dL — AB (ref 0–149)
VLDL CHOLESTEROL CAL: 57 mg/dL — AB (ref 5–40)

## 2016-07-21 LAB — CBC
HEMATOCRIT: 41.5 % (ref 37.5–51.0)
HEMOGLOBIN: 14.3 g/dL (ref 13.0–17.7)
MCH: 31.4 pg (ref 26.6–33.0)
MCHC: 34.5 g/dL (ref 31.5–35.7)
MCV: 91 fL (ref 79–97)
Platelets: 188 10*3/uL (ref 150–379)
RBC: 4.56 x10E6/uL (ref 4.14–5.80)
RDW: 14.1 % (ref 12.3–15.4)
WBC: 6.8 10*3/uL (ref 3.4–10.8)

## 2016-07-23 ENCOUNTER — Other Ambulatory Visit: Payer: Self-pay | Admitting: Internal Medicine

## 2016-07-23 NOTE — Telephone Encounter (Signed)
Rx refill sent to pharmacy. 

## 2016-07-27 ENCOUNTER — Telehealth: Payer: Self-pay | Admitting: Family Medicine

## 2016-07-27 MED ORDER — OXYCODONE-ACETAMINOPHEN 10-325 MG PO TABS: 1.0000 | ORAL_TABLET | ORAL | 0 refills | Status: DC | PRN

## 2016-07-27 NOTE — Telephone Encounter (Signed)
ok 

## 2016-07-27 NOTE — Telephone Encounter (Signed)
Requesting refill on Percocet - Ok to refill??

## 2016-07-28 ENCOUNTER — Other Ambulatory Visit: Payer: Medicare Other

## 2016-07-28 DIAGNOSIS — R739 Hyperglycemia, unspecified: Secondary | ICD-10-CM

## 2016-07-28 NOTE — Telephone Encounter (Signed)
RX printed, left up front and patient aware to pick up via vm 

## 2016-07-29 ENCOUNTER — Encounter: Payer: Self-pay | Admitting: *Deleted

## 2016-07-29 ENCOUNTER — Encounter: Payer: Self-pay | Admitting: Cardiology

## 2016-07-29 ENCOUNTER — Other Ambulatory Visit: Payer: Self-pay | Admitting: Family Medicine

## 2016-07-29 DIAGNOSIS — R739 Hyperglycemia, unspecified: Secondary | ICD-10-CM | POA: Diagnosis not present

## 2016-07-29 DIAGNOSIS — Z006 Encounter for examination for normal comparison and control in clinical research program: Secondary | ICD-10-CM

## 2016-07-29 LAB — HEMOGLOBIN A1C
Hgb A1c MFr Bld: 7 % — ABNORMAL HIGH (ref <5.7)
MEAN PLASMA GLUCOSE: 154 mg/dL

## 2016-07-29 NOTE — Progress Notes (Signed)
Orion-10 Study All elements of the informed consent form,study requirements and expectations were reviewed with the patient, and all questions and concerns were identified and addressed prior to signing of the consent.No procedures were performed prior to consenting the patient.The patient was given an adequate amount of time to make an informed decision. A copy of the informed consent was provided to the patient to take home. 07/29/2016   9:00am

## 2016-07-31 ENCOUNTER — Ambulatory Visit (INDEPENDENT_AMBULATORY_CARE_PROVIDER_SITE_OTHER): Payer: Medicare Other | Admitting: Family Medicine

## 2016-07-31 ENCOUNTER — Encounter: Payer: Self-pay | Admitting: Family Medicine

## 2016-07-31 VITALS — BP 150/80 | HR 68 | Temp 97.5°F | Resp 18 | Ht 70.0 in | Wt 210.0 lb

## 2016-07-31 DIAGNOSIS — I2510 Atherosclerotic heart disease of native coronary artery without angina pectoris: Secondary | ICD-10-CM | POA: Diagnosis not present

## 2016-07-31 DIAGNOSIS — E1151 Type 2 diabetes mellitus with diabetic peripheral angiopathy without gangrene: Secondary | ICD-10-CM | POA: Diagnosis not present

## 2016-07-31 MED ORDER — METFORMIN HCL 850 MG PO TABS: 850.0000 mg | ORAL_TABLET | Freq: Two times a day (BID) | ORAL | 5 refills | Status: DC
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
## 2016-08-04 ENCOUNTER — Encounter: Payer: Self-pay | Admitting: Cardiology

## 2016-08-04 ENCOUNTER — Other Ambulatory Visit: Payer: Self-pay | Admitting: Internal Medicine

## 2016-08-10 ENCOUNTER — Encounter: Payer: Self-pay | Admitting: Cardiology

## 2016-08-12 ENCOUNTER — Encounter (HOSPITAL_COMMUNITY): Payer: Self-pay | Admitting: Nurse Practitioner

## 2016-08-12 ENCOUNTER — Encounter: Payer: Self-pay | Admitting: Cardiology

## 2016-08-12 ENCOUNTER — Other Ambulatory Visit: Payer: Self-pay | Admitting: Nurse Practitioner

## 2016-08-12 ENCOUNTER — Encounter: Payer: Self-pay | Admitting: *Deleted

## 2016-08-12 DIAGNOSIS — Z006 Encounter for examination for normal comparison and control in clinical research program: Secondary | ICD-10-CM

## 2016-08-12 MED ORDER — AMBULATORY NON FORMULARY MEDICATION: 300.0000 mg | Status: DC

## 2016-08-12 NOTE — Progress Notes (Signed)
"  Done in Error

## 2016-08-12 NOTE — Progress Notes (Signed)
Patient was randomized into the Orion-10 Study. Patient received first dosage of Inclisiran sodium 300mg  vs placebo today. Patient tolerated study medication well.

## 2016-08-26 ENCOUNTER — Telehealth: Payer: Self-pay | Admitting: *Deleted

## 2016-08-26 NOTE — Telephone Encounter (Signed)
Orion-10 study I called patient to see how he was doing after Orion-10 injection. Patient stated he is doing well with no adverse events. I will see patient on March 15,2018 for 30-day study visit.

## 2016-08-31 ENCOUNTER — Telehealth: Payer: Self-pay | Admitting: Family Medicine

## 2016-08-31 MED ORDER — OXYCODONE-ACETAMINOPHEN 10-325 MG PO TABS: 1.0000 | ORAL_TABLET | ORAL | 0 refills | Status: DC | PRN

## 2016-08-31 NOTE — Telephone Encounter (Signed)
Patient requesting a refill on Oxycodone - Ok to refill??         [phone] - H [phone] - C

## 2016-08-31 NOTE — Telephone Encounter (Signed)
ok 

## 2016-08-31 NOTE — Telephone Encounter (Signed)
RX printed, left up front and patient aware to pick up  

## 2016-09-10 ENCOUNTER — Encounter: Payer: Self-pay | Admitting: *Deleted

## 2016-09-10 DIAGNOSIS — Z006 Encounter for examination for normal comparison and control in clinical research program: Secondary | ICD-10-CM

## 2016-09-11 NOTE — Progress Notes (Signed)
Patient was seen for 30-day Orion study visit. Patient is doing well. We will see him back in 60 days for next study visit.

## 2016-09-17 DIAGNOSIS — Z961 Presence of intraocular lens: Secondary | ICD-10-CM | POA: Diagnosis not present

## 2016-09-19 ENCOUNTER — Other Ambulatory Visit: Payer: Self-pay | Admitting: Internal Medicine

## 2016-10-05 ENCOUNTER — Other Ambulatory Visit: Payer: Self-pay | Admitting: Family Medicine

## 2016-10-05 MED ORDER — OXYCODONE-ACETAMINOPHEN 10-325 MG PO TABS: 1.0000 | ORAL_TABLET | ORAL | 0 refills | Status: DC | PRN

## 2016-10-05 NOTE — Telephone Encounter (Signed)
LRF 08/31/16 #180  LOV 07/31/16  OK refill?

## 2016-10-05 NOTE — Telephone Encounter (Signed)
Rx ready, left pt message ready pick up

## 2016-10-05 NOTE — Telephone Encounter (Signed)
ok 

## 2016-10-06 ENCOUNTER — Other Ambulatory Visit: Payer: Self-pay | Admitting: Family Medicine

## 2016-10-20 ENCOUNTER — Other Ambulatory Visit: Payer: Self-pay | Admitting: Family Medicine

## 2016-11-05 ENCOUNTER — Telehealth: Payer: Self-pay | Admitting: Family Medicine

## 2016-11-05 NOTE — Telephone Encounter (Signed)
Patient requesting a refill on Oxycodone - Ok to refill??        

## 2016-11-06 ENCOUNTER — Other Ambulatory Visit: Payer: Self-pay | Admitting: Family Medicine

## 2016-11-06 MED ORDER — OXYCODONE-ACETAMINOPHEN 10-325 MG PO TABS: 1.0000 | ORAL_TABLET | ORAL | 0 refills | Status: DC | PRN

## 2016-11-06 NOTE — Telephone Encounter (Signed)
ok 

## 2016-11-06 NOTE — Telephone Encounter (Signed)
RX printed, left up front and patient aware to pick up  

## 2016-11-09 ENCOUNTER — Encounter: Payer: Self-pay | Admitting: *Deleted

## 2016-11-09 ENCOUNTER — Other Ambulatory Visit: Payer: Self-pay | Admitting: Family Medicine

## 2016-11-09 DIAGNOSIS — Z006 Encounter for examination for normal comparison and control in clinical research program: Secondary | ICD-10-CM

## 2016-11-09 NOTE — Progress Notes (Signed)
Orion-10 visit  I saw patient for 90-day study visit. Patient received his second study injection subcutaneously.Patient tolerated injection well. I will see patient back in 60 days for next study visit.

## 2016-11-09 NOTE — Telephone Encounter (Signed)
Amlodipine has been discontinued refill denied

## 2016-11-26 ENCOUNTER — Other Ambulatory Visit: Payer: Self-pay | Admitting: Family Medicine

## 2016-12-16 ENCOUNTER — Telehealth: Payer: Self-pay | Admitting: Family Medicine

## 2016-12-16 MED ORDER — OXYCODONE-ACETAMINOPHEN 10-325 MG PO TABS: 1.0000 | ORAL_TABLET | ORAL | 0 refills | Status: DC | PRN

## 2016-12-16 NOTE — Telephone Encounter (Signed)
Approved.  

## 2016-12-16 NOTE — Telephone Encounter (Signed)
RX printed, left up front and patient aware to pick up  

## 2016-12-16 NOTE — Telephone Encounter (Signed)
Patient requesting a refill on Oxycodone - Ok to refill??       LRF - 11/06/16

## 2016-12-25 ENCOUNTER — Other Ambulatory Visit: Payer: Self-pay | Admitting: Family Medicine

## 2016-12-31 ENCOUNTER — Other Ambulatory Visit: Payer: Self-pay | Admitting: Family Medicine

## 2017-01-13 ENCOUNTER — Encounter: Payer: Self-pay | Admitting: *Deleted

## 2017-01-13 DIAGNOSIS — Z006 Encounter for examination for normal comparison and control in clinical research program: Secondary | ICD-10-CM

## 2017-01-13 NOTE — Progress Notes (Signed)
Patient was seen for [place] Study visit on 01/11/17.. Labs were drawn per protocol for [facility]. Patient is doing well. Patient was scheduled for next study visit on 05/10/2017.

## 2017-01-14 ENCOUNTER — Telehealth: Payer: Self-pay | Admitting: *Deleted

## 2017-01-14 NOTE — Telephone Encounter (Signed)
[name] I called patient to let him know his platelets were 139.I told patient I would repeat CBC at his next visit. Dr Lia Foyer reviewed labs and asked me to notify patient. Patient verbalized understanding.

## 2017-01-15 ENCOUNTER — Telehealth: Payer: Self-pay | Admitting: Family Medicine

## 2017-01-15 MED ORDER — OXYCODONE-ACETAMINOPHEN 10-325 MG PO TABS: 1.0000 | ORAL_TABLET | ORAL | 0 refills | Status: DC | PRN

## 2017-01-15 NOTE — Telephone Encounter (Signed)
ok 

## 2017-01-15 NOTE — Telephone Encounter (Signed)
RX printed, left up front and patient aware to pick up via vm 

## 2017-01-15 NOTE — Telephone Encounter (Signed)
Patient requesting a refill on Oxycodone - Ok to refill??        

## 2017-02-04 ENCOUNTER — Emergency Department (HOSPITAL_COMMUNITY)
Admission: EM | Admit: 2017-02-04 | Discharge: 2017-02-05 | Disposition: A | Discharge status: Home / Self Care | Payer: Medicare Other | Attending: Emergency Medicine | Admitting: Emergency Medicine

## 2017-02-04 DIAGNOSIS — E1165 Type 2 diabetes mellitus with hyperglycemia: Secondary | ICD-10-CM | POA: Insufficient documentation

## 2017-02-04 DIAGNOSIS — Z7984 Long term (current) use of oral hypoglycemic drugs: Secondary | ICD-10-CM | POA: Diagnosis not present

## 2017-02-04 DIAGNOSIS — T782XXA Anaphylactic shock, unspecified, initial encounter: Secondary | ICD-10-CM | POA: Insufficient documentation

## 2017-02-04 DIAGNOSIS — F17210 Nicotine dependence, cigarettes, uncomplicated: Secondary | ICD-10-CM | POA: Insufficient documentation

## 2017-02-04 DIAGNOSIS — I10 Essential (primary) hypertension: Secondary | ICD-10-CM | POA: Insufficient documentation

## 2017-02-04 DIAGNOSIS — Z7902 Long term (current) use of antithrombotics/antiplatelets: Secondary | ICD-10-CM | POA: Diagnosis not present

## 2017-02-04 DIAGNOSIS — I2510 Atherosclerotic heart disease of native coronary artery without angina pectoris: Secondary | ICD-10-CM | POA: Insufficient documentation

## 2017-02-04 DIAGNOSIS — R21 Rash and other nonspecific skin eruption: Secondary | ICD-10-CM | POA: Diagnosis present

## 2017-02-04 DIAGNOSIS — Z8551 Personal history of malignant neoplasm of bladder: Secondary | ICD-10-CM | POA: Insufficient documentation

## 2017-02-04 DIAGNOSIS — J449 Chronic obstructive pulmonary disease, unspecified: Secondary | ICD-10-CM | POA: Insufficient documentation

## 2017-02-04 LAB — CBC
HCT: 45.7 % (ref 39.0–52.0)
Hemoglobin: 15.7 g/dL (ref 13.0–17.0)
MCH: 31 pg (ref 26.0–34.0)
MCHC: 34.4 g/dL (ref 30.0–36.0)
MCV: 90.3 fL (ref 78.0–100.0)
PLATELETS: 143 10*3/uL — AB (ref 150–400)
RBC: 5.06 MIL/uL (ref 4.22–5.81)
RDW: 14.3 % (ref 11.5–15.5)
WBC: 5.3 10*3/uL (ref 4.0–10.5)

## 2017-02-04 LAB — I-STAT CHEM 8, ED
BUN: 20 mg/dL (ref 6–20)
CALCIUM ION: 1.22 mmol/L (ref 1.15–1.40)
CHLORIDE: 104 mmol/L (ref 101–111)
Creatinine, Ser: 0.9 mg/dL (ref 0.61–1.24)
GLUCOSE: 160 mg/dL — AB (ref 65–99)
HCT: 47 % (ref 39.0–52.0)
HEMOGLOBIN: 16 g/dL (ref 13.0–17.0)
Potassium: 3.5 mmol/L (ref 3.5–5.1)
Sodium: 144 mmol/L (ref 135–145)
TCO2: 29 mmol/L (ref 0–100)

## 2017-02-04 MED ORDER — METHYLPREDNISOLONE SODIUM SUCC 125 MG IJ SOLR
125.0000 mg | Freq: Once | INTRAMUSCULAR | Status: AC
  Administered 2017-02-04: 125 mg via INTRAVENOUS

## 2017-02-04 MED ORDER — METHYLPREDNISOLONE SODIUM SUCC 125 MG IJ SOLR
INTRAMUSCULAR | Status: AC
  Filled 2017-02-04: qty 2

## 2017-02-04 MED ORDER — EPINEPHRINE 0.3 MG/0.3ML IJ SOAJ
INTRAMUSCULAR | Status: AC
  Filled 2017-02-04: qty 0.3

## 2017-02-04 MED ORDER — EPINEPHRINE 0.3 MG/0.3ML IJ SOAJ
0.3000 mg | Freq: Once | INTRAMUSCULAR | Status: AC
  Administered 2017-02-04: 0.3 mg via INTRAMUSCULAR
  Filled 2017-02-04: qty 0.3

## 2017-02-04 MED ORDER — DIPHENHYDRAMINE HCL 50 MG/ML IJ SOLN
50.0000 mg | Freq: Once | INTRAMUSCULAR | Status: AC
  Administered 2017-02-04: 50 mg via INTRAVENOUS
  Filled 2017-02-04: qty 1

## 2017-02-04 NOTE — ED Triage Notes (Signed)
Per p[t he was at home and just gotten out of shower and felt tightness in his chest and felt SOB. Pt. Stated he noticed hives all over him and was having difficulty swollowing.
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
## 2017-02-05 MED ORDER — EPINEPHRINE 0.3 MG/0.3ML IJ SOAJ: 0.3000 mg | Freq: Once | INTRAMUSCULAR | 0 refills | Status: AC

## 2017-02-05 MED ORDER — PREDNISONE 20 MG PO TABS: ORAL_TABLET | ORAL | 0 refills | Status: DC

## 2017-02-05 NOTE — ED Provider Notes (Signed)
12:33 AM Care assumed from Dr. Winfred Leeds - allergic reaction treated with epinephrine, steroids. Will need to be observed for 4 hours.  4:41 AM Patient has been resting comfortably with no further urticarial lesions, no swelling of the tongue. He is felt to be safe for discharge home. He is discharged with prescription for prednisone and epinephrine autoinjector. Told to take over-the-counter second-generation antihistamines for the next 5 days, then as needed. Prescription given for prednisone. Advised to monitor his blood glucose because of likelihood of it going higher while on steroids.   Delora Fuel, MD 96/04/54 [phone]

## 2017-02-05 NOTE — Discharge Instructions (Signed)
Take loratadine (Claritin) or cetirizine (Zyrtec) once a day for the next five days. Take diphenhydramine (Benadryl) as needed.  Monitor your blood sugar closely while you are taking prednisone - it will make your blood sugar go higher.  If you ever have a similar reaction in the future, inject yourself with the Epi Pen, then come to the hospital IMMEDIATELY! The Epi Pen is just meant to stabilize your condition so you can make it to the hospital.

## 2017-02-09 ENCOUNTER — Other Ambulatory Visit: Payer: Self-pay | Admitting: *Deleted

## 2017-02-09 MED ORDER — AMBULATORY NON FORMULARY MEDICATION: 300.0000 mg | Status: DC

## 2017-02-10 ENCOUNTER — Telehealth: Payer: Self-pay | Admitting: *Deleted

## 2017-02-10 NOTE — Telephone Encounter (Signed)
Spoke with patient to see how he's feeling since having a reaction that caused him to present to the ED.  He stated that all is fine and he is going to followup with his PCP on Monday February 15, 2017.

## 2017-02-15 ENCOUNTER — Ambulatory Visit (INDEPENDENT_AMBULATORY_CARE_PROVIDER_SITE_OTHER): Payer: Medicare Other | Admitting: Family Medicine

## 2017-02-15 ENCOUNTER — Encounter: Payer: Self-pay | Admitting: Family Medicine

## 2017-02-15 VITALS — BP 140/90 | HR 68 | Temp 97.6°F | Resp 20 | Ht 70.0 in | Wt 200.0 lb

## 2017-02-15 DIAGNOSIS — F17200 Nicotine dependence, unspecified, uncomplicated: Secondary | ICD-10-CM | POA: Diagnosis not present

## 2017-02-15 DIAGNOSIS — I2510 Atherosclerotic heart disease of native coronary artery without angina pectoris: Secondary | ICD-10-CM | POA: Diagnosis not present

## 2017-02-15 DIAGNOSIS — E1169 Type 2 diabetes mellitus with other specified complication: Secondary | ICD-10-CM

## 2017-02-15 DIAGNOSIS — I10 Essential (primary) hypertension: Secondary | ICD-10-CM | POA: Diagnosis not present

## 2017-02-15 DIAGNOSIS — Z122 Encounter for screening for malignant neoplasm of respiratory organs: Secondary | ICD-10-CM

## 2017-02-15 MED ORDER — OXYCODONE-ACETAMINOPHEN 10-325 MG PO TABS: 1.0000 | ORAL_TABLET | ORAL | 0 refills | Status: DC | PRN
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
## 2017-02-16 LAB — LIPID PANEL
CHOL/HDL RATIO: 4.6 ratio (ref <5.0)
CHOLESTEROL: 160 mg/dL (ref <200)
HDL: 35 mg/dL — AB (ref >40)
LDL Cholesterol: 95 mg/dL (ref <100)
Triglycerides: 151 mg/dL — ABNORMAL HIGH (ref <150)
VLDL: 30 mg/dL (ref <30)

## 2017-02-16 LAB — COMPLETE METABOLIC PANEL WITH GFR
ALBUMIN: 4.1 g/dL (ref 3.6–5.1)
ALK PHOS: 49 U/L (ref 40–115)
ALT: 30 U/L (ref 9–46)
AST: 32 U/L (ref 10–35)
BUN: 18 mg/dL (ref 7–25)
CALCIUM: 9.4 mg/dL (ref 8.6–10.3)
CO2: 25 mmol/L (ref 20–32)
Chloride: 98 mmol/L (ref 98–110)
Creat: 0.91 mg/dL (ref 0.70–1.18)
GFR, EST NON AFRICAN AMERICAN: 84 mL/min (ref >=60)
GFR, Est African American: >89 mL/min (ref >=60)
Glucose, Bld: 119 mg/dL — ABNORMAL HIGH (ref 70–99)
POTASSIUM: 4.6 mmol/L (ref 3.5–5.3)
Sodium: 136 mmol/L (ref 135–146)
Total Bilirubin: 0.8 mg/dL (ref 0.2–1.2)
Total Protein: 7.1 g/dL (ref 6.1–8.1)

## 2017-02-16 LAB — MICROALBUMIN, URINE: Microalb, Ur: 3.4 mg/dL

## 2017-02-16 LAB — HEMOGLOBIN A1C
HEMOGLOBIN A1C: 6.1 % — AB (ref <5.7)
Mean Plasma Glucose: 128 mg/dL

## 2017-02-25 ENCOUNTER — Ambulatory Visit
Admission: RE | Admit: 2017-02-25 | Discharge: 2017-02-25 | Disposition: A | Discharge status: Home / Self Care | Payer: Medicare Other | Source: Ambulatory Visit | Attending: Family Medicine | Admitting: Family Medicine

## 2017-02-25 DIAGNOSIS — Z122 Encounter for screening for malignant neoplasm of respiratory organs: Secondary | ICD-10-CM

## 2017-02-25 DIAGNOSIS — F17210 Nicotine dependence, cigarettes, uncomplicated: Secondary | ICD-10-CM | POA: Diagnosis not present

## 2017-03-12 ENCOUNTER — Other Ambulatory Visit: Payer: Self-pay | Admitting: *Deleted

## 2017-03-12 MED ORDER — METFORMIN HCL 850 MG PO TABS: ORAL_TABLET | ORAL | 2 refills | Status: DC

## 2017-03-17 ENCOUNTER — Telehealth: Payer: Self-pay

## 2017-03-17 NOTE — Telephone Encounter (Signed)
Patient called requesting a refill on Percocet 10-325 mg Last OV 02/15/2017 Next OV: None Last refilled 02/15/2017 Please advise

## 2017-03-18 MED ORDER — OXYCODONE-ACETAMINOPHEN 10-325 MG PO TABS: 1.0000 | ORAL_TABLET | ORAL | 0 refills | Status: DC | PRN

## 2017-03-18 NOTE — Addendum Note (Signed)
Addended by: Valere Dross on: 03/18/2017 09:40 AM   Modules accepted: Orders

## 2017-03-18 NOTE — Telephone Encounter (Signed)
Patient notified

## 2017-03-18 NOTE — Telephone Encounter (Signed)
ok 

## 2017-03-18 NOTE — Telephone Encounter (Signed)
RX refilled and placed on desk to be sign to be sent to [facility]

## 2017-03-18 NOTE — Telephone Encounter (Signed)
Needs to be collect by patient

## 2017-03-22 DIAGNOSIS — E119 Type 2 diabetes mellitus without complications: Secondary | ICD-10-CM | POA: Diagnosis not present

## 2017-03-22 DIAGNOSIS — Z961 Presence of intraocular lens: Secondary | ICD-10-CM | POA: Diagnosis not present

## 2017-03-22 LAB — HM DIABETES EYE EXAM

## 2017-03-26 ENCOUNTER — Encounter: Payer: Self-pay | Admitting: Family Medicine

## 2017-03-27 ENCOUNTER — Other Ambulatory Visit: Payer: Self-pay | Admitting: Family Medicine

## 2017-03-29 NOTE — Telephone Encounter (Signed)
Refill appropriate 

## 2017-04-15 ENCOUNTER — Telehealth: Payer: Self-pay | Admitting: Family Medicine

## 2017-04-15 MED ORDER — CLOPIDOGREL BISULFATE 75 MG PO TABS: 75.0000 mg | ORAL_TABLET | Freq: Every day | ORAL | 1 refills | Status: DC

## 2017-04-15 NOTE — Telephone Encounter (Signed)
Medication refilled per protocol. 

## 2017-04-19 ENCOUNTER — Telehealth: Payer: Self-pay | Admitting: Family Medicine

## 2017-04-19 NOTE — Telephone Encounter (Signed)
ok 

## 2017-04-19 NOTE — Telephone Encounter (Signed)
Patient requesting a refill on Oxycodone - Ok to refill??        

## 2017-04-20 MED ORDER — OXYCODONE-ACETAMINOPHEN 10-325 MG PO TABS: 1.0000 | ORAL_TABLET | ORAL | 0 refills | Status: DC | PRN

## 2017-04-20 NOTE — Telephone Encounter (Signed)
RX printed, left up front and patient aware to pick up after 2 pm via vm 

## 2017-04-28 ENCOUNTER — Other Ambulatory Visit: Payer: Self-pay | Admitting: Family Medicine

## 2017-04-28 MED ORDER — HYDROCHLOROTHIAZIDE 25 MG PO TABS
25.0000 mg | ORAL_TABLET | Freq: Every day | ORAL | 3 refills | Status: DC
Start: 2017-04-28 — End: 2018-10-20

## 2017-04-29 ENCOUNTER — Encounter (HOSPITAL_COMMUNITY): Payer: Self-pay | Admitting: Emergency Medicine

## 2017-04-29 ENCOUNTER — Emergency Department (HOSPITAL_COMMUNITY)
Admission: EM | Admit: 2017-04-29 | Discharge: 2017-04-29 | Disposition: A | Discharge status: Home / Self Care | Payer: Medicare Other | Attending: Emergency Medicine | Admitting: Emergency Medicine

## 2017-04-29 DIAGNOSIS — R21 Rash and other nonspecific skin eruption: Secondary | ICD-10-CM | POA: Insufficient documentation

## 2017-04-29 DIAGNOSIS — Z5321 Procedure and treatment not carried out due to patient leaving prior to being seen by health care provider: Secondary | ICD-10-CM | POA: Insufficient documentation

## 2017-04-29 NOTE — ED Notes (Signed)
Pt states he feels better and has decided to leave.

## 2017-04-29 NOTE — ED Triage Notes (Signed)
Pt st's he had a allergic reaction short while ago.  St's he woke up with itching and rash on bil arms.  Also st's he was short of breath.  Pt used his Epi Pen with relief.  Pt st's he still feels slightly short of breath.  Speaking in full sentences.  No oral swelling noted at this time.  Pt st's he is unsure of what he was allergic to

## 2017-04-29 NOTE — ED Notes (Signed)
Per charge pt LWBS

## 2017-05-04 ENCOUNTER — Encounter: Payer: Self-pay | Admitting: Family Medicine

## 2017-05-04 ENCOUNTER — Ambulatory Visit (INDEPENDENT_AMBULATORY_CARE_PROVIDER_SITE_OTHER): Payer: Medicare Other | Admitting: Family Medicine

## 2017-05-04 VITALS — BP 140/78 | HR 96 | Temp 97.9°F | Resp 18 | Ht 70.0 in | Wt 201.0 lb

## 2017-05-04 DIAGNOSIS — I2510 Atherosclerotic heart disease of native coronary artery without angina pectoris: Secondary | ICD-10-CM

## 2017-05-04 DIAGNOSIS — T783XXA Angioneurotic edema, initial encounter: Secondary | ICD-10-CM | POA: Diagnosis not present

## 2017-05-04 DIAGNOSIS — Z23 Encounter for immunization: Secondary | ICD-10-CM

## 2017-05-04 MED ORDER — EPINEPHRINE 0.3 MG/0.3ML IJ SOAJ: 0.3000 mg | Freq: Once | INTRAMUSCULAR | 2 refills | Status: AC

## 2017-05-04 MED ORDER — DOXAZOSIN MESYLATE 4 MG PO TABS: 4.0000 mg | ORAL_TABLET | Freq: Every day | ORAL | 5 refills | Status: DC
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
## 2017-05-05 LAB — FOOD ALLERGY PROFILE
Allergen, Salmon, f41: 0.11 kU/L — ABNORMAL HIGH
Almonds: <0.1 kU/L
CASHEW IGE: 0.12 kU/L — AB
CLASS: 0
CLASS: 0
CLASS: 0
CLASS: 0
CLASS: 0
CLASS: 0
CLASS: 0
CLASS: 0
CLASS: 3
CLASS: 0
CLASS: 0
CLASS: 0
CLASS: 0
CLASS: 0
Class: 1
EGG WHITE IGE: 0.25 kU/L — AB
MILK IGE: 6.17 kU/L — AB
SOYBEAN IGE: 0.1 kU/L — AB
Scallop IgE: 0.14 kU/L — ABNORMAL HIGH
Shrimp IgE: 0.42 kU/L — ABNORMAL HIGH
WHEAT IGE: 0.23 kU/L — AB
Walnut: <0.1 kU/L

## 2017-05-05 LAB — INTERPRETATION:

## 2017-05-05 NOTE — Addendum Note (Signed)
Addended by: Shary Decamp B on: 05/05/2017 08:55 AM   Modules accepted: Orders

## 2017-05-11 ENCOUNTER — Other Ambulatory Visit: Payer: Self-pay | Admitting: Family Medicine

## 2017-05-11 MED ORDER — METOPROLOL SUCCINATE ER 50 MG PO TB24: ORAL_TABLET | ORAL | 3 refills | Status: DC

## 2017-05-11 NOTE — Telephone Encounter (Signed)
Requesting refill on Metoprolol 50mg   - med sent to pharm

## 2017-05-24 ENCOUNTER — Telehealth: Payer: Self-pay | Admitting: Family Medicine

## 2017-05-24 MED ORDER — OXYCODONE-ACETAMINOPHEN 10-325 MG PO TABS: 1.0000 | ORAL_TABLET | ORAL | 0 refills | Status: DC | PRN

## 2017-05-24 NOTE — Telephone Encounter (Signed)
Patient requesting a refill on Oxycodone - Ok to refill??       LRF 04/20/17

## 2017-05-24 NOTE — Telephone Encounter (Signed)
Approved.  #[id number].

## 2017-05-25 NOTE — Telephone Encounter (Signed)
RX printed, left up front and patient aware to pick up  

## 2017-05-31 ENCOUNTER — Other Ambulatory Visit: Payer: Self-pay

## 2017-05-31 MED ORDER — BUPROPION HCL ER (SR) 150 MG PO TB12: 150.0000 mg | ORAL_TABLET | Freq: Two times a day (BID) | ORAL | 1 refills | Status: DC

## 2017-06-01 ENCOUNTER — Other Ambulatory Visit: Payer: Self-pay | Admitting: Internal Medicine

## 2017-06-01 MED ORDER — EZETIMIBE 10 MG PO TABS: 10.0000 mg | ORAL_TABLET | Freq: Every day | ORAL | 0 refills | Status: DC

## 2017-06-01 MED ORDER — BUPROPION HCL ER (SR) 150 MG PO TB12: 150.0000 mg | ORAL_TABLET | Freq: Two times a day (BID) | ORAL | 0 refills | Status: DC

## 2017-06-10 ENCOUNTER — Other Ambulatory Visit: Payer: Self-pay | Admitting: Family Medicine

## 2017-06-10 NOTE — Telephone Encounter (Signed)
Medication refilled per protocol. 

## 2017-06-24 ENCOUNTER — Other Ambulatory Visit: Payer: Self-pay | Admitting: Family Medicine

## 2017-06-24 MED ORDER — OXYCODONE-ACETAMINOPHEN 10-325 MG PO TABS: 1.0000 | ORAL_TABLET | ORAL | 0 refills | Status: DC | PRN

## 2017-06-24 NOTE — Telephone Encounter (Signed)
Patient is requesting a refill on Hydrocodone   Requesting refill      LOV: 05/04/17 LRF:  05/24/17

## 2017-06-25 ENCOUNTER — Other Ambulatory Visit: Payer: Self-pay | Admitting: Family Medicine

## 2017-07-12 ENCOUNTER — Encounter: Payer: Self-pay | Admitting: *Deleted

## 2017-07-12 DIAGNOSIS — Z006 Encounter for examination for normal comparison and control in clinical research program: Secondary | ICD-10-CM

## 2017-07-12 NOTE — Progress Notes (Signed)
Patient to [facility] for V6 Day 330 in the [place] 10 research study. No c/o, aes or saes to report.  Next injection appointment set for Nov 08, 2017 @0930 .

## 2017-07-26 ENCOUNTER — Ambulatory Visit: Payer: Medicare Other | Admitting: Internal Medicine

## 2017-07-26 ENCOUNTER — Other Ambulatory Visit: Payer: Self-pay | Admitting: Family Medicine

## 2017-07-26 NOTE — Telephone Encounter (Signed)
Patient requesting a refill on Oxycodone     LOV:  11/6/118 LRF:   06/24/17

## 2017-07-27 MED ORDER — OXYCODONE-ACETAMINOPHEN 10-325 MG PO TABS: 1.0000 | ORAL_TABLET | ORAL | 0 refills | Status: DC | PRN

## 2017-08-02 ENCOUNTER — Ambulatory Visit (INDEPENDENT_AMBULATORY_CARE_PROVIDER_SITE_OTHER): Payer: Medicare Other | Admitting: Internal Medicine

## 2017-08-02 ENCOUNTER — Encounter: Payer: Self-pay | Admitting: Internal Medicine

## 2017-08-02 VITALS — BP 126/62 | HR 82 | Ht 70.0 in | Wt 192.4 lb

## 2017-08-02 DIAGNOSIS — E785 Hyperlipidemia, unspecified: Secondary | ICD-10-CM

## 2017-08-02 DIAGNOSIS — I10 Essential (primary) hypertension: Secondary | ICD-10-CM

## 2017-08-02 DIAGNOSIS — F17200 Nicotine dependence, unspecified, uncomplicated: Secondary | ICD-10-CM

## 2017-08-02 DIAGNOSIS — I2510 Atherosclerotic heart disease of native coronary artery without angina pectoris: Secondary | ICD-10-CM | POA: Diagnosis not present

## 2017-08-02 DIAGNOSIS — I6523 Occlusion and stenosis of bilateral carotid arteries: Secondary | ICD-10-CM
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
## 2017-08-02 NOTE — Patient Instructions (Signed)
Your physician recommends that you continue on your current medications as directed. Please refer to the Current Medication list given to you today. Your physician wants you to follow-up in: 9 months with Dr. Ross.  You will receive a reminder letter in the mail two months in advance. If you don't receive a letter, please call our office to schedule the follow-up appointment.  

## 2017-08-23 ENCOUNTER — Other Ambulatory Visit: Payer: Self-pay | Admitting: Internal Medicine

## 2017-08-23 MED ORDER — EZETIMIBE 10 MG PO TABS: 10.0000 mg | ORAL_TABLET | Freq: Every day | ORAL | 3 refills | Status: DC

## 2017-08-28 ENCOUNTER — Other Ambulatory Visit: Payer: Self-pay | Admitting: Family Medicine

## 2017-08-31 ENCOUNTER — Other Ambulatory Visit: Payer: Self-pay

## 2017-08-31 MED ORDER — OXYCODONE-ACETAMINOPHEN 10-325 MG PO TABS: 1.0000 | ORAL_TABLET | ORAL | 0 refills | Status: DC | PRN

## 2017-08-31 NOTE — Telephone Encounter (Signed)
Last OV 05/04/2017 Last refill 07/27/2017 Ok to refill

## 2017-09-09 ENCOUNTER — Other Ambulatory Visit: Payer: Self-pay | Admitting: Family Medicine

## 2017-09-09 MED ORDER — RAMIPRIL 10 MG PO CAPS: 10.0000 mg | ORAL_CAPSULE | Freq: Two times a day (BID) | ORAL | 3 refills | Status: DC

## 2017-09-30 ENCOUNTER — Other Ambulatory Visit: Payer: Self-pay | Admitting: Family Medicine

## 2017-09-30 MED ORDER — OXYCODONE-ACETAMINOPHEN 10-325 MG PO TABS: 1.0000 | ORAL_TABLET | ORAL | 0 refills | Status: DC | PRN

## 2017-09-30 NOTE — Telephone Encounter (Signed)
Patient requesting a refill on Oxycodone     LOV:  05/04/17  LRF:  08/31/17

## 2017-10-02 ENCOUNTER — Other Ambulatory Visit: Payer: Self-pay | Admitting: Family Medicine

## 2017-10-09 ENCOUNTER — Other Ambulatory Visit: Payer: Self-pay | Admitting: Family Medicine

## 2017-11-01 ENCOUNTER — Other Ambulatory Visit: Payer: Self-pay | Admitting: Family Medicine

## 2017-11-01 NOTE — Telephone Encounter (Signed)
Patient requesting a refill on Oxycodone     LOV: 05/04/17  LRF:   09/30/17

## 2017-11-02 MED ORDER — OXYCODONE-ACETAMINOPHEN 10-325 MG PO TABS: 1.0000 | ORAL_TABLET | ORAL | 0 refills | Status: DC | PRN

## 2017-11-08 ENCOUNTER — Encounter: Payer: Self-pay | Admitting: *Deleted

## 2017-11-08 ENCOUNTER — Encounter: Payer: Medicaid Other | Admitting: *Deleted

## 2017-11-08 DIAGNOSIS — Z006 Encounter for examination for normal comparison and control in clinical research program: Secondary | ICD-10-CM

## 2017-11-09 NOTE — Progress Notes (Signed)
Late Entry. Subject to research clinic on 11/08/17 for V7-D450 in the [place] study.  No c/o, aes, or saes to report.  Injection given and subject remained in clinic for 30 minutes per protocol. Next clinic visit scheduled.

## 2017-11-10 DIAGNOSIS — H903 Sensorineural hearing loss, bilateral: Secondary | ICD-10-CM | POA: Diagnosis not present

## 2017-11-29 ENCOUNTER — Other Ambulatory Visit: Payer: Self-pay | Admitting: Family Medicine

## 2017-12-03 ENCOUNTER — Other Ambulatory Visit: Payer: Self-pay | Admitting: Family Medicine

## 2017-12-03 MED ORDER — OXYCODONE-ACETAMINOPHEN 10-325 MG PO TABS: 1.0000 | ORAL_TABLET | ORAL | 0 refills | Status: DC | PRN

## 2017-12-03 NOTE — Telephone Encounter (Signed)
Patient requesting a refill on Oxycodone     LOV: 05/04/18  LRF:   11/02/17

## 2017-12-15 ENCOUNTER — Other Ambulatory Visit: Payer: Self-pay | Admitting: Family Medicine

## 2017-12-27 VITALS — BP 145/60 | HR 57 | Temp 98.0°F | Resp 18 | Wt 196.0 lb

## 2017-12-27 DIAGNOSIS — Z006 Encounter for examination for normal comparison and control in clinical research program: Secondary | ICD-10-CM

## 2017-12-27 NOTE — Progress Notes (Signed)
[name] to research clinic for visit [phone] in the [place] 10 study.  No complaints, adverse events, or serious adverse events to report.  EOS appointment scheduled.

## 2018-01-03 ENCOUNTER — Other Ambulatory Visit: Payer: Self-pay | Admitting: Family Medicine

## 2018-01-03 NOTE — Telephone Encounter (Signed)
Patient requesting a refill on Oxycodone     LOV:  05/04/17  LRF:     12/03/17

## 2018-01-04 MED ORDER — OXYCODONE-ACETAMINOPHEN 10-325 MG PO TABS: 1.0000 | ORAL_TABLET | ORAL | 0 refills | Status: DC | PRN

## 2018-01-04 NOTE — Telephone Encounter (Signed)
Due for ov.  

## 2018-01-05 ENCOUNTER — Other Ambulatory Visit: Payer: Self-pay

## 2018-01-05 ENCOUNTER — Encounter (HOSPITAL_COMMUNITY): Payer: Self-pay | Admitting: *Deleted

## 2018-01-05 ENCOUNTER — Encounter: Payer: Self-pay | Admitting: Family Medicine

## 2018-01-05 ENCOUNTER — Emergency Department (HOSPITAL_COMMUNITY)
Admission: EM | Admit: 2018-01-05 | Discharge: 2018-01-06 | Disposition: A | Discharge status: Home / Self Care | Payer: Medicare HMO | Attending: Emergency Medicine | Admitting: Emergency Medicine

## 2018-01-05 DIAGNOSIS — Z8551 Personal history of malignant neoplasm of bladder: Secondary | ICD-10-CM | POA: Diagnosis not present

## 2018-01-05 DIAGNOSIS — L509 Urticaria, unspecified: Secondary | ICD-10-CM | POA: Diagnosis present

## 2018-01-05 DIAGNOSIS — Z79899 Other long term (current) drug therapy: Secondary | ICD-10-CM | POA: Insufficient documentation

## 2018-01-05 DIAGNOSIS — Z7984 Long term (current) use of oral hypoglycemic drugs: Secondary | ICD-10-CM | POA: Diagnosis not present

## 2018-01-05 DIAGNOSIS — Z7902 Long term (current) use of antithrombotics/antiplatelets: Secondary | ICD-10-CM | POA: Insufficient documentation

## 2018-01-05 DIAGNOSIS — I10 Essential (primary) hypertension: Secondary | ICD-10-CM | POA: Diagnosis not present

## 2018-01-05 DIAGNOSIS — F17210 Nicotine dependence, cigarettes, uncomplicated: Secondary | ICD-10-CM | POA: Insufficient documentation

## 2018-01-05 DIAGNOSIS — T7840XA Allergy, unspecified, initial encounter: Secondary | ICD-10-CM | POA: Diagnosis not present

## 2018-01-05 DIAGNOSIS — J449 Chronic obstructive pulmonary disease, unspecified: Secondary | ICD-10-CM | POA: Diagnosis not present

## 2018-01-05 DIAGNOSIS — I2510 Atherosclerotic heart disease of native coronary artery without angina pectoris: Secondary | ICD-10-CM | POA: Diagnosis not present

## 2018-01-05 DIAGNOSIS — T781XXA Other adverse food reactions, not elsewhere classified, initial encounter: Secondary | ICD-10-CM | POA: Diagnosis not present

## 2018-01-05 MED ORDER — METHYLPREDNISOLONE SODIUM SUCC 125 MG IJ SOLR
125.0000 mg | Freq: Once | INTRAMUSCULAR | Status: AC
  Administered 2018-01-05: 125 mg via INTRAVENOUS
  Filled 2018-01-05: qty 2

## 2018-01-05 MED ORDER — DIPHENHYDRAMINE HCL 50 MG/ML IJ SOLN
50.0000 mg | Freq: Once | INTRAMUSCULAR | Status: AC
  Administered 2018-01-05: 50 mg via INTRAVENOUS
  Filled 2018-01-05: qty 1

## 2018-01-05 MED ORDER — FAMOTIDINE IN NACL 20-0.9 MG/50ML-% IV SOLN
20.0000 mg | Freq: Once | INTRAVENOUS | Status: AC
Start: 2018-01-05 — End: 2018-01-05
  Administered 2018-01-05: 20 mg via INTRAVENOUS
  Filled 2018-01-05: qty 50

## 2018-01-05 MED ORDER — PREDNISONE 20 MG PO TABS: 40.0000 mg | ORAL_TABLET | Freq: Every day | ORAL | 0 refills | Status: DC

## 2018-01-05 NOTE — Telephone Encounter (Signed)
Letter mailed to pt to schedule ov
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
## 2018-01-05 NOTE — Discharge Instructions (Addendum)
Take the prescription as directed.  Take over the counter benadryl, as directed on packaging, as needed for itching.  If the benadryl is too sedating, take an over the counter non-sedating antihistamine such as claritin, allegra or zyrtec, as directed on packaging.  Call your regular medical doctor tomorrow to schedule a follow up appointment within the next 2 days.  Return to the Emergency Department immediately sooner if worsening.

## 2018-01-05 NOTE — ED Triage Notes (Addendum)
Pt states that he had taken his medications tonight and had left his house to go eat when he started itching, hives noted to right side of abd area, admits to sob, chest pain, itching all over, denies any throat or mouth swelling,

## 2018-01-06 NOTE — ED Provider Notes (Signed)
12:55 AM patient left a change of shift to recheck at 1 AM.  He had presented with an allergic reaction with urticarial rash.  Patient states he no longer has any rash and he is no longer itching.  He feels ready to be discharged.  I do not see any obvious lesions, patient has his shirt off.   Rolland Porter, MD 01/06/18 [phone]

## 2018-01-11 ENCOUNTER — Ambulatory Visit (INDEPENDENT_AMBULATORY_CARE_PROVIDER_SITE_OTHER): Payer: Medicare HMO | Admitting: Family Medicine

## 2018-01-11 ENCOUNTER — Encounter: Payer: Self-pay | Admitting: Family Medicine

## 2018-01-11 VITALS — BP 126/74 | HR 70 | Temp 98.0°F | Resp 18 | Ht 70.0 in | Wt 199.0 lb

## 2018-01-11 DIAGNOSIS — I10 Essential (primary) hypertension: Secondary | ICD-10-CM

## 2018-01-11 DIAGNOSIS — I70413 Atherosclerosis of autologous vein bypass graft(s) of the extremities with intermittent claudication, bilateral legs: Secondary | ICD-10-CM | POA: Diagnosis not present

## 2018-01-11 DIAGNOSIS — E1169 Type 2 diabetes mellitus with other specified complication: Secondary | ICD-10-CM | POA: Diagnosis not present

## 2018-01-11 DIAGNOSIS — T7840XD Allergy, unspecified, subsequent encounter: Secondary | ICD-10-CM

## 2018-01-11 DIAGNOSIS — I2510 Atherosclerotic heart disease of native coronary artery without angina pectoris: Secondary | ICD-10-CM

## 2018-01-11 DIAGNOSIS — Z87898 Personal history of other specified conditions: Secondary | ICD-10-CM

## 2018-01-11 MED ORDER — LOSARTAN POTASSIUM 50 MG PO TABS: 50.0000 mg | ORAL_TABLET | Freq: Every day | ORAL | 3 refills | Status: DC

## 2018-01-11 MED ORDER — LEVOCETIRIZINE DIHYDROCHLORIDE 5 MG PO TABS: 5.0000 mg | ORAL_TABLET | Freq: Every evening | ORAL | 5 refills | Status: DC
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
## 2018-01-19 ENCOUNTER — Other Ambulatory Visit: Payer: Self-pay | Admitting: Family Medicine

## 2018-01-19 DIAGNOSIS — I70413 Atherosclerosis of autologous vein bypass graft(s) of the extremities with intermittent claudication, bilateral legs: Secondary | ICD-10-CM

## 2018-01-19 DIAGNOSIS — I739 Peripheral vascular disease, unspecified: Secondary | ICD-10-CM

## 2018-01-24 ENCOUNTER — Ambulatory Visit (HOSPITAL_COMMUNITY)
Admission: RE | Admit: 2018-01-24 | Discharge: 2018-01-24 | Disposition: A | Discharge status: Home / Self Care | Payer: Medicare HMO | Source: Ambulatory Visit | Attending: Internal Medicine | Admitting: Internal Medicine

## 2018-01-24 DIAGNOSIS — I739 Peripheral vascular disease, unspecified: Secondary | ICD-10-CM | POA: Diagnosis not present

## 2018-01-24 DIAGNOSIS — I70413 Atherosclerosis of autologous vein bypass graft(s) of the extremities with intermittent claudication, bilateral legs: Secondary | ICD-10-CM | POA: Insufficient documentation

## 2018-01-26 ENCOUNTER — Ambulatory Visit (INDEPENDENT_AMBULATORY_CARE_PROVIDER_SITE_OTHER): Payer: Self-pay

## 2018-01-26 ENCOUNTER — Ambulatory Visit (INDEPENDENT_AMBULATORY_CARE_PROVIDER_SITE_OTHER): Payer: Medicare HMO | Admitting: Family

## 2018-01-26 ENCOUNTER — Encounter (INDEPENDENT_AMBULATORY_CARE_PROVIDER_SITE_OTHER): Payer: Self-pay | Admitting: Family

## 2018-01-26 VITALS — Ht 70.0 in | Wt 199.0 lb

## 2018-01-26 DIAGNOSIS — G8929 Other chronic pain: Secondary | ICD-10-CM | POA: Diagnosis not present

## 2018-01-26 DIAGNOSIS — M542 Cervicalgia: Secondary | ICD-10-CM

## 2018-01-26 DIAGNOSIS — M25511 Pain in right shoulder: Secondary | ICD-10-CM

## 2018-01-26 MED ORDER — LIDOCAINE HCL 1 % IJ SOLN
5.0000 mL | INTRAMUSCULAR | Status: AC | PRN
  Administered 2018-01-26: 5 mL

## 2018-01-26 MED ORDER — METHYLPREDNISOLONE ACETATE 40 MG/ML IJ SUSP
40.0000 mg | INTRAMUSCULAR | Status: AC | PRN
  Administered 2018-01-26: 40 mg via INTRA_ARTICULAR
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
## 2018-01-27 ENCOUNTER — Other Ambulatory Visit: Payer: Self-pay | Admitting: *Deleted

## 2018-01-27 DIAGNOSIS — I739 Peripheral vascular disease, unspecified: Secondary | ICD-10-CM

## 2018-01-27 NOTE — Addendum Note (Signed)
Addended by: Sheral Flow on: 01/27/2018 04:12 PM   Modules accepted: Orders

## 2018-01-31 ENCOUNTER — Other Ambulatory Visit: Payer: Self-pay

## 2018-02-03 ENCOUNTER — Encounter: Payer: Medicare HMO | Admitting: *Deleted

## 2018-02-03 ENCOUNTER — Other Ambulatory Visit: Payer: Self-pay

## 2018-02-08 ENCOUNTER — Other Ambulatory Visit: Payer: Self-pay | Admitting: Family Medicine

## 2018-02-08 MED ORDER — OXYCODONE-ACETAMINOPHEN 10-325 MG PO TABS: 1.0000 | ORAL_TABLET | ORAL | 0 refills | Status: DC | PRN

## 2018-02-08 NOTE — Telephone Encounter (Signed)
Patient requesting a refill on Oxycodone     LOV: 01/11/18  LRF:   01/04/18

## 2018-02-10 ENCOUNTER — Encounter: Payer: Self-pay | Admitting: *Deleted

## 2018-02-10 DIAGNOSIS — Z006 Encounter for examination for normal comparison and control in clinical research program: Secondary | ICD-10-CM

## 2018-02-10 NOTE — Progress Notes (Signed)
Late entry:  Subject to research clinic for EOS visit in the [place] 10 study.  No cos, saes to report. Did report rash to arm and torso that he went to the ED to have checked.  Physical and neuro checks conducted by Dr. Lia Foyer.  Subject then consented to [facility] 8 research study.  Subject met inclusion and exclusion criteria.  The informed consent form, study requirements and expectations were reviewed with the subject and questions and concerns were addressed prior to the signing of the consent form.  The subject verbalized understanding of the trial requirements.  The subject agreed to participate in the [place] 8 trial and signed the informed consent.  The informed consent was obtained prior to performance of any protocol-specific procedures for the subject.  A copy of the signed informed consent was given to the subject and a copy was placed in the subject's medical record.  Injection for [place] 8 was given and subject remained in clinic for 30 minutes following per protocol for observation.

## 2018-02-14 ENCOUNTER — Ambulatory Visit (INDEPENDENT_AMBULATORY_CARE_PROVIDER_SITE_OTHER): Payer: Medicare HMO | Admitting: Vascular Surgery

## 2018-02-14 ENCOUNTER — Encounter: Payer: Self-pay | Admitting: Vascular Surgery

## 2018-02-14 ENCOUNTER — Other Ambulatory Visit: Payer: Self-pay

## 2018-02-14 VITALS — BP 122/67 | HR 80 | Temp 97.8°F | Resp 18 | Ht 70.0 in | Wt 202.0 lb

## 2018-02-14 DIAGNOSIS — I7409 Other arterial embolism and thrombosis of abdominal aorta: Secondary | ICD-10-CM

## 2018-02-14 DIAGNOSIS — I739 Peripheral vascular disease, unspecified: Secondary | ICD-10-CM

## 2018-02-14 DIAGNOSIS — Z01812 Encounter for preprocedural laboratory examination: Secondary | ICD-10-CM

## 2018-02-14 DIAGNOSIS — I779 Disorder of arteries and arterioles, unspecified: Secondary | ICD-10-CM
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
## 2018-02-21 ENCOUNTER — Encounter: Payer: Self-pay | Admitting: Allergy and Immunology

## 2018-02-21 ENCOUNTER — Ambulatory Visit (INDEPENDENT_AMBULATORY_CARE_PROVIDER_SITE_OTHER): Payer: Medicare HMO | Admitting: Allergy and Immunology

## 2018-02-21 VITALS — BP 122/64 | HR 96 | Resp 16 | Ht 70.0 in | Wt 204.0 lb

## 2018-02-21 DIAGNOSIS — T783XXD Angioneurotic edema, subsequent encounter: Secondary | ICD-10-CM

## 2018-02-21 DIAGNOSIS — J3089 Other allergic rhinitis: Secondary | ICD-10-CM

## 2018-02-21 DIAGNOSIS — T783XXA Angioneurotic edema, initial encounter: Secondary | ICD-10-CM | POA: Insufficient documentation

## 2018-02-21 DIAGNOSIS — F17200 Nicotine dependence, unspecified, uncomplicated: Secondary | ICD-10-CM

## 2018-02-21 DIAGNOSIS — T7840XD Allergy, unspecified, subsequent encounter: Secondary | ICD-10-CM | POA: Diagnosis not present

## 2018-02-21 DIAGNOSIS — J449 Chronic obstructive pulmonary disease, unspecified: Secondary | ICD-10-CM | POA: Insufficient documentation

## 2018-02-21 DIAGNOSIS — L500 Allergic urticaria: Secondary | ICD-10-CM | POA: Diagnosis not present

## 2018-02-21 DIAGNOSIS — J42 Unspecified chronic bronchitis: Secondary | ICD-10-CM | POA: Diagnosis not present

## 2018-02-21 DIAGNOSIS — T7840XA Allergy, unspecified, initial encounter: Secondary | ICD-10-CM | POA: Insufficient documentation

## 2018-02-21 HISTORY — DX: Angioneurotic edema, initial encounter: T78.3XXA

## 2018-02-21 MED ORDER — FLUTICASONE PROPIONATE 50 MCG/ACT NA SUSP: NASAL | 3 refills | Status: DC

## 2018-02-21 MED ORDER — BUDESONIDE-FORMOTEROL FUMARATE 160-4.5 MCG/ACT IN AERO: 2.0000 | INHALATION_SPRAY | Freq: Two times a day (BID) | RESPIRATORY_TRACT | 5 refills | Status: DC

## 2018-02-21 NOTE — Patient Instructions (Signed)
Allergic reaction The patients history suggests allergic reaction with an unclear trigger. Food allergen skin tests were negative today despite a positive histamine control. The negative predictive value for skin tests is excellent (greater than 95%). We will proceed with in vitro lab studies to help establish an etiology.  The following labs have been ordered: Anti-thyroglobulin antibody, thyroid peroxidase antibody, C4, baseline serum tryptase, and serum specific IgE against peanut, peanut component, shellfish panel, and galactose-alpha-1,3-galactose panel.   Should symptoms recur, a journal is to be kept recording any foods eaten, beverages consumed, medications taken within a 6 hour period prior to the onset of symptoms, as well as activities performed, and environmental conditions. For any symptoms concerning for anaphylaxis, epinephrine is to be administered and 911 is to be called immediately.  Continue to have access to epinephrine autoinjector 2 pack.  Perennial and seasonal allergic rhinitis  Aeroallergen avoidance measures have been discussed and provided in written form.  A prescription has been provided for fluticasone nasal spray, one spray per nostril 1-2 times daily as needed. Proper nasal spray technique has been discussed and demonstrated.  Nasal saline spray (i.e., Simply Saline) or nasal saline lavage (i.e., NeilMed) is recommended as needed and prior to medicated nasal sprays.  For thick post nasal drainage, add guaifenesin 600 mg (Mucinex)  twice daily as needed with adequate hydration as discussed.  Angioedema The patient's history suggests ACE inhibitor related angioedema.  Continue avoidance of ACE inhibitors.  COPD (chronic obstructive pulmonary disease) ([name])  Tobacco cessation has been discussed and encouraged.  A prescription has been provided for Symbicort (budesonide/formoterol) 160/4.5 g,  2 inhalations twice a day. To maximize pulmonary deposition, a  spacer has been provided along with instructions for its proper administration with an HFA inhaler.  A prescription has been provided for albuterol HFA, 1 to 2 inhalations every 6 hours if needed.  Subjective and objective measures of pulmonary function will be followed and the treatment plan will be adjusted accordingly.   When lab results have returned the patient will be called with further recommendations and follow up instructions.  Reducing Pollen Exposure  The American Academy of Allergy, Asthma and Immunology suggests the following steps to reduce your exposure to pollen during allergy seasons.    1. Do not hang sheets or clothing out to dry; pollen may collect on these items. 2. Do not mow lawns or spend time around freshly cut grass; mowing stirs up pollen. 3. Keep windows closed at night.  Keep car windows closed while driving. 4. Minimize morning activities outdoors, a time when pollen counts are usually at their highest. 5. Stay indoors as much as possible when pollen counts or humidity is high and on windy days when pollen tends to remain in the air longer. 6. Use air conditioning when possible.  Many air conditioners have filters that trap the pollen spores. 7. Use a HEPA room air filter to remove pollen form the indoor air you breathe.   Control of House Dust Mite Allergen  House dust mites play a major role in allergic asthma and rhinitis.  They occur in environments with high humidity wherever human skin, the food for dust mites is found. High levels have been detected in dust obtained from mattresses, pillows, carpets, upholstered furniture, bed covers, clothes and soft toys.  The principal allergen of the house dust mite is found in its feces.  A gram of dust may contain 1,000 mites and 250,000 fecal particles.  Mite antigen is easily measured  in the air during house cleaning activities.    1. Encase mattresses, including the box spring, and pillow, in an air tight  cover.  Seal the zipper end of the encased mattresses with wide adhesive tape. 2. Wash the bedding in water of 130 degrees Farenheit weekly.  Avoid cotton comforters/quilts and flannel bedding: the most ideal bed covering is the dacron comforter. 3. Remove all upholstered furniture from the bedroom. 4. Remove carpets, carpet padding, rugs, and non-washable window drapes from the bedroom.  Wash drapes weekly or use plastic window coverings. 5. Remove all non-washable stuffed toys from the bedroom.  Wash stuffed toys weekly. 6. Have the room cleaned frequently with a vacuum cleaner and a damp dust-mop.  The patient should not be in a room which is being cleaned and should wait 1 hour after cleaning before going into the room. 7. Close and seal all heating outlets in the bedroom.  Otherwise, the room will become filled with dust-laden air.  An electric heater can be used to heat the room. Reduce indoor humidity to less than 50%.  Do not use a humidifier.  Control of Dog or Cat Allergen  Avoidance is the best way to manage a dog or cat allergy. If you have a dog or cat and are allergic to dog or cats, consider removing the dog or cat from the home. If you have a dog or cat but don't want to find it a new home, or if your family wants a pet even though someone in the household is allergic, here are some strategies that may help keep symptoms at bay:  1. Keep the pet out of your bedroom and restrict it to only a few rooms. Be advised that keeping the dog or cat in only one room will not limit the allergens to that room. 2. Don't pet, hug or kiss the dog or cat; if you do, wash your hands with soap and water. 3. High-efficiency particulate air (HEPA) cleaners run continuously in a bedroom or living room can reduce allergen levels over time. 4. Place electrostatic material sheet in the air inlet vent in the bedroom. 5. Regular use of a high-efficiency vacuum cleaner or a central vacuum can reduce allergen  levels. 6. Giving your dog or cat a bath at least once a week can reduce airborne allergen.  Control of Mold Allergen  Mold and fungi can grow on a variety of surfaces provided certain temperature and moisture conditions exist.  Outdoor molds grow on plants, decaying vegetation and soil.  The major outdoor mold, Alternaria and Cladosporium, are found in very high numbers during hot and dry conditions.  Generally, a late Summer - Fall peak is seen for common outdoor fungal spores.  Rain will temporarily lower outdoor mold spore count, but counts rise rapidly when the rainy period ends.  The most important indoor molds are Aspergillus and Penicillium.  Dark, humid and poorly ventilated basements are ideal sites for mold growth.  The next most common sites of mold growth are the bathroom and the kitchen.  Outdoor [employer] 1. Use air conditioning and keep windows closed 2. Avoid exposure to decaying vegetation. 3. Avoid leaf raking. 4. Avoid grain handling. 5. Consider wearing a face mask if working in moldy areas.  Indoor Mold Control 1. Maintain humidity below 50%. 2. Clean washable surfaces with 5% bleach solution. 3. Remove sources e.g. Contaminated carpets.

## 2018-02-21 NOTE — Assessment & Plan Note (Signed)
The patient's history suggests ACE inhibitor related angioedema.  Continue avoidance of ACE inhibitors.
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
## 2018-02-21 NOTE — Assessment & Plan Note (Signed)
   Tobacco cessation has been discussed and encouraged.  A prescription has been provided for Symbicort (budesonide/formoterol) 160/4.5 g, 2 inhalations twice a day. To maximize pulmonary deposition, a spacer has been provided along with instructions for its proper administration with an HFA inhaler.  A prescription has been provided for albuterol HFA, 1 to 2 inhalations every 6 hours if needed.  Subjective and objective measures of pulmonary function will be followed and the treatment plan will be adjusted accordingly.

## 2018-02-21 NOTE — Assessment & Plan Note (Signed)
The patients history suggests allergic reaction with an unclear trigger. Food allergen skin tests were negative today despite a positive histamine control. The negative predictive value for skin tests is excellent (greater than 95%). We will proceed with in vitro lab studies to help establish an etiology.  The following labs have been ordered: Anti-thyroglobulin antibody, thyroid peroxidase antibody, C4, baseline serum tryptase, and serum specific IgE against peanut, peanut component, shellfish panel, and galactose-alpha-1,3-galactose panel.   Should symptoms recur, a journal is to be kept recording any foods eaten, beverages consumed, medications taken within a 6 hour period prior to the onset of symptoms, as well as activities performed, and environmental conditions. For any symptoms concerning for anaphylaxis, epinephrine is to be administered and 911 is to be called immediately.  Continue to have access to epinephrine autoinjector 2 pack.

## 2018-02-21 NOTE — Assessment & Plan Note (Signed)
   Aeroallergen avoidance measures have been discussed and provided in written form.  A prescription has been provided for fluticasone nasal spray, one spray per nostril 1-2 times daily as needed. Proper nasal spray technique has been discussed and demonstrated.  Nasal saline spray (i.e., Simply Saline) or nasal saline lavage (i.e., NeilMed) is recommended as needed and prior to medicated nasal sprays.  For thick post nasal drainage, add guaifenesin 600 mg (Mucinex)  twice daily as needed with adequate hydration as discussed.

## 2018-03-02 ENCOUNTER — Ambulatory Visit (HOSPITAL_COMMUNITY)
Admission: RE | Admit: 2018-03-02 | Discharge: 2018-03-02 | Disposition: A | Discharge status: Home / Self Care | Payer: Medicare HMO | Source: Ambulatory Visit | Attending: Vascular Surgery | Admitting: Vascular Surgery

## 2018-03-02 ENCOUNTER — Encounter (HOSPITAL_COMMUNITY): Payer: Self-pay

## 2018-03-02 ENCOUNTER — Other Ambulatory Visit: Payer: Self-pay

## 2018-03-02 DIAGNOSIS — I7409 Other arterial embolism and thrombosis of abdominal aorta: Secondary | ICD-10-CM | POA: Diagnosis not present

## 2018-03-02 DIAGNOSIS — R160 Hepatomegaly, not elsewhere classified: Secondary | ICD-10-CM | POA: Insufficient documentation

## 2018-03-02 DIAGNOSIS — Z8505 Personal history of malignant neoplasm of liver: Secondary | ICD-10-CM | POA: Diagnosis not present

## 2018-03-02 DIAGNOSIS — I700 Atherosclerosis of aorta: Secondary | ICD-10-CM | POA: Diagnosis not present

## 2018-03-02 DIAGNOSIS — I779 Disorder of arteries and arterioles, unspecified: Secondary | ICD-10-CM

## 2018-03-02 DIAGNOSIS — I739 Peripheral vascular disease, unspecified: Secondary | ICD-10-CM

## 2018-03-02 DIAGNOSIS — M5136 Other intervertebral disc degeneration, lumbar region: Secondary | ICD-10-CM | POA: Diagnosis not present

## 2018-03-02 DIAGNOSIS — M5134 Other intervertebral disc degeneration, thoracic region: Secondary | ICD-10-CM | POA: Insufficient documentation

## 2018-03-02 DIAGNOSIS — J849 Interstitial pulmonary disease, unspecified: Secondary | ICD-10-CM | POA: Insufficient documentation

## 2018-03-02 DIAGNOSIS — Z01810 Encounter for preprocedural cardiovascular examination: Secondary | ICD-10-CM | POA: Diagnosis not present

## 2018-03-02 DIAGNOSIS — Z01812 Encounter for preprocedural laboratory examination: Secondary | ICD-10-CM

## 2018-03-02 LAB — POCT I-STAT CREATININE: CREATININE: 1.4 mg/dL — AB (ref 0.61–1.24)

## 2018-03-02 MED ORDER — IOPAMIDOL (ISOVUE-370) INJECTION 76%
100.0000 mL | Freq: Once | INTRAVENOUS | Status: AC | PRN
  Administered 2018-03-02: 80 mL via INTRAVENOUS

## 2018-03-07 ENCOUNTER — Ambulatory Visit: Payer: Medicare HMO | Admitting: Allergy and Immunology

## 2018-03-07 DIAGNOSIS — L500 Allergic urticaria: Secondary | ICD-10-CM | POA: Diagnosis not present

## 2018-03-07 DIAGNOSIS — T7840XD Allergy, unspecified, subsequent encounter: Secondary | ICD-10-CM | POA: Diagnosis not present

## 2018-03-07 DIAGNOSIS — T783XXD Angioneurotic edema, subsequent encounter: Secondary | ICD-10-CM | POA: Diagnosis not present

## 2018-03-07 NOTE — Addendum Note (Signed)
Addended by: Herbie Drape on: 03/07/2018 04:05 PM   Modules accepted: Orders

## 2018-03-07 NOTE — Addendum Note (Signed)
Addended by: Herbie Drape on: 03/07/2018 04:10 PM   Modules accepted: Orders

## 2018-03-10 LAB — ALPHA-GAL PANEL
Beef (Bos spp) IgE: 88 kU/L — ABNORMAL HIGH (ref <0.35)
Class Interpretation: 3
Class Interpretation: 4
Class Interpretation: 5
Lamb/Mutton (Ovis spp) IgE: 12.8 kU/L — ABNORMAL HIGH (ref <0.35)
Pork (Sus spp) IgE: 45.6 kU/L — ABNORMAL HIGH (ref <0.35)

## 2018-03-10 LAB — THYROID PEROXIDASE ANTIBODY: Thyroperoxidase Ab SerPl-aCnc: 21 IU/mL (ref 0–34)

## 2018-03-10 LAB — ALLERGEN, PEANUT F13: Peanut IgE: <0.1 kU/L

## 2018-03-10 LAB — IGE PEANUT COMPONENT PROFILE
F352-IgE Ara h 8: <0.1 kU/L
F422-IgE Ara h 1: <0.1 kU/L
F423-IgE Ara h 2: <0.1 kU/L
F424-IgE Ara h 3: 0.12 kU/L — AB
F427-IgE Ara h 9: <0.1 kU/L
F447-IgE Ara h 6: <0.1 kU/L

## 2018-03-10 LAB — THYROGLOBULIN ANTIBODY: Thyroglobulin Antibody: <1 IU/mL (ref 0.0–0.9)

## 2018-03-10 LAB — ALLERGEN PROFILE, SHELLFISH
Clam IgE: 0.1 kU/L — AB
F023-IgE Crab: 0.17 kU/L — AB
F080-IgE Lobster: 0.14 kU/L — AB
F290-IgE Oyster: <0.1 kU/L
Scallop IgE: 0.17 kU/L — AB
Shrimp IgE: 0.43 kU/L — AB

## 2018-03-10 LAB — TRYPTASE: Tryptase: 11 ug/L (ref 2.2–13.2)

## 2018-03-10 LAB — C4 COMPLEMENT: Complement C4, Serum: 20 mg/dL (ref 14–44)

## 2018-03-11 ENCOUNTER — Other Ambulatory Visit: Payer: Self-pay | Admitting: Family Medicine

## 2018-03-11 MED ORDER — OXYCODONE-ACETAMINOPHEN 10-325 MG PO TABS: 1.0000 | ORAL_TABLET | ORAL | 0 refills | Status: DC | PRN

## 2018-03-11 NOTE — Telephone Encounter (Signed)
Patient requesting a refill on Oxycodone     LOV: 01/11/18  LRF:  02/08/18

## 2018-03-15 ENCOUNTER — Encounter: Payer: Self-pay | Admitting: Vascular Surgery

## 2018-03-15 ENCOUNTER — Ambulatory Visit (INDEPENDENT_AMBULATORY_CARE_PROVIDER_SITE_OTHER): Payer: Medicare HMO | Admitting: Vascular Surgery

## 2018-03-15 ENCOUNTER — Other Ambulatory Visit: Payer: Self-pay

## 2018-03-15 VITALS — BP 133/80 | HR 95 | Temp 98.0°F | Resp 16 | Ht 70.0 in | Wt 207.0 lb

## 2018-03-15 DIAGNOSIS — I739 Peripheral vascular disease, unspecified: Secondary | ICD-10-CM
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
## 2018-03-17 NOTE — Progress Notes (Signed)
ntbs to discuss liver calcification

## 2018-03-18 NOTE — Progress Notes (Signed)
LMOVM to call to set up apt

## 2018-03-21 NOTE — Progress Notes (Signed)
Apt scheduled for 03/25/18

## 2018-03-24 DIAGNOSIS — Z961 Presence of intraocular lens: Secondary | ICD-10-CM | POA: Diagnosis not present

## 2018-03-24 DIAGNOSIS — Z7984 Long term (current) use of oral hypoglycemic drugs: Secondary | ICD-10-CM | POA: Diagnosis not present

## 2018-03-24 DIAGNOSIS — E119 Type 2 diabetes mellitus without complications: Secondary | ICD-10-CM | POA: Diagnosis not present

## 2018-03-24 LAB — HM DIABETES EYE EXAM

## 2018-03-25 ENCOUNTER — Ambulatory Visit (INDEPENDENT_AMBULATORY_CARE_PROVIDER_SITE_OTHER): Payer: Medicare HMO | Admitting: Family Medicine

## 2018-03-25 ENCOUNTER — Encounter: Payer: Self-pay | Admitting: Family Medicine

## 2018-03-25 VITALS — BP 130/76 | HR 84 | Temp 98.0°F | Resp 18 | Ht 70.0 in | Wt 205.0 lb

## 2018-03-25 DIAGNOSIS — K7689 Other specified diseases of liver: Secondary | ICD-10-CM | POA: Diagnosis not present

## 2018-03-25 DIAGNOSIS — M48062 Spinal stenosis, lumbar region with neurogenic claudication: Secondary | ICD-10-CM | POA: Diagnosis not present

## 2018-03-25 DIAGNOSIS — G9519 Other vascular myelopathies: Secondary | ICD-10-CM

## 2018-03-25 DIAGNOSIS — Z23 Encounter for immunization: Secondary | ICD-10-CM | POA: Diagnosis not present
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
## 2018-03-29 ENCOUNTER — Ambulatory Visit: Payer: Medicare HMO | Admitting: Vascular Surgery

## 2018-04-05 ENCOUNTER — Other Ambulatory Visit: Payer: Self-pay | Admitting: Family Medicine

## 2018-04-05 DIAGNOSIS — K7689 Other specified diseases of liver: Secondary | ICD-10-CM

## 2018-04-06 DIAGNOSIS — M4804 Spinal stenosis, thoracic region: Secondary | ICD-10-CM | POA: Diagnosis not present

## 2018-04-08 ENCOUNTER — Other Ambulatory Visit: Payer: Self-pay | Admitting: Family Medicine

## 2018-04-08 DIAGNOSIS — K7689 Other specified diseases of liver: Secondary | ICD-10-CM

## 2018-04-11 ENCOUNTER — Encounter: Payer: Self-pay | Admitting: *Deleted

## 2018-04-13 ENCOUNTER — Other Ambulatory Visit: Payer: Self-pay | Admitting: Family Medicine

## 2018-04-13 NOTE — Telephone Encounter (Signed)
Patient requesting a refill on Oxycodone     LOV: 03/25/18  LRF:     03/11/18

## 2018-04-14 ENCOUNTER — Telehealth: Payer: Self-pay

## 2018-04-14 ENCOUNTER — Other Ambulatory Visit: Payer: Self-pay | Admitting: Neurosurgery

## 2018-04-14 ENCOUNTER — Telehealth: Payer: Self-pay | Admitting: Nurse Practitioner

## 2018-04-14 DIAGNOSIS — M4804 Spinal stenosis, thoracic region: Secondary | ICD-10-CM

## 2018-04-14 MED ORDER — OXYCODONE-ACETAMINOPHEN 10-325 MG PO TABS: 1.0000 | ORAL_TABLET | ORAL | 0 refills | Status: DC | PRN

## 2018-04-14 NOTE — Telephone Encounter (Signed)
   [facility] Pre-operative Risk Assessment    Request for surgical clearance:  1. What type of surgery is being performed? Myelogram   2. When is this surgery scheduled? Not scheduled yet, waiting for response from our office  3. What type of clearance is required (medical clearance vs. Pharmacy clearance to hold med vs. Both)? Pharmacy clearance  4. Are there any medications that need to be held prior to surgery and how long? Stop plavix 5 days prior to the procedure  5. Practice name and name of physician performing surgery? [facility]    6. What is your office phone number [phone]    7.   What is your office fax number [phone]  8.   Anesthesia type (None, local, MAC, general) ? None listed  Mady Haagensen 04/14/2018, 11:42 AM  _________________________________________________________________   (provider comments below)

## 2018-04-14 NOTE — Telephone Encounter (Signed)
Phone call to patient to verify medication list and allergies for myelogram procedure. Pt instructed to hold wellbutrin for 48hrs prior to myelogram appointment time and Plavix for 5 days prior (pending approval from cardiologist Dr. Harrington Challenger). Pt verbalized understanding. [place] hold request fax sent to Dr. Harrington Challenger, awaiting response.

## 2018-04-20 NOTE — Telephone Encounter (Signed)
This will need to be followed this Friday or next Monday when Dr. Harrington Challenger is in the hospital/clinic and call the patient afterward.

## 2018-04-20 NOTE — Telephone Encounter (Signed)
   Primary Cardiologist:Dr Ross  Chart reviewed as part of pre-operative protocol coverage. Because of Tin Engram past medical history and time since last visit, he/she will require a follow-up visit in order to better assess preoperative cardiovascular risk.  Pre-op covering staff: - Please schedule appointment with Dr Harrington Challenger or Dr Alan Ripper APP at [facility]. and call patient to inform them. - Please contact requesting surgeon's office via preferred method (i.e, phone, fax) to inform them of need for appointment prior to surgery.  Kerin Ransom, PA-C  04/20/2018, 2:26 PM

## 2018-04-20 NOTE — Telephone Encounter (Signed)
Reviewed plans for myelogram Pt iwht history of CAD and PAD I saw him in Feb 2019   No symptoms of CP at that time  If no active CP I think he is low risk and OK to proceed with myelogram   Watch BP

## 2018-04-21 ENCOUNTER — Ambulatory Visit
Admission: RE | Admit: 2018-04-21 | Discharge: 2018-04-21 | Disposition: A | Discharge status: Home / Self Care | Payer: Medicare HMO | Source: Ambulatory Visit | Attending: Family Medicine | Admitting: Family Medicine

## 2018-04-21 DIAGNOSIS — K769 Liver disease, unspecified: Secondary | ICD-10-CM | POA: Diagnosis not present

## 2018-04-21 DIAGNOSIS — K7689 Other specified diseases of liver: Secondary | ICD-10-CM

## 2018-04-21 DIAGNOSIS — R932 Abnormal findings on diagnostic imaging of liver and biliary tract: Secondary | ICD-10-CM | POA: Diagnosis not present

## 2018-04-21 MED ORDER — GADOBENATE DIMEGLUMINE 529 MG/ML IV SOLN
20.0000 mL | Freq: Once | INTRAVENOUS | Status: AC | PRN
  Administered 2018-04-21: 20 mL via INTRAVENOUS

## 2018-04-25 NOTE — Telephone Encounter (Signed)
Left voice mail to call back between preop hours. Otherwise will review during office visit next week with Dr. Harrington Challenger.

## 2018-04-25 NOTE — Telephone Encounter (Signed)
Will route back to Preop Pool per B. Bhagat for a phone call to patient.

## 2018-04-27 ENCOUNTER — Other Ambulatory Visit: Payer: Self-pay | Admitting: Family Medicine

## 2018-04-27 DIAGNOSIS — K7689 Other specified diseases of liver: Secondary | ICD-10-CM

## 2018-05-02 ENCOUNTER — Encounter: Payer: Self-pay | Admitting: Internal Medicine

## 2018-05-02 ENCOUNTER — Ambulatory Visit (INDEPENDENT_AMBULATORY_CARE_PROVIDER_SITE_OTHER): Payer: Medicare HMO | Admitting: Internal Medicine

## 2018-05-02 VITALS — BP 142/74 | HR 51 | Ht 70.0 in | Wt 207.4 lb

## 2018-05-02 DIAGNOSIS — I10 Essential (primary) hypertension: Secondary | ICD-10-CM

## 2018-05-02 DIAGNOSIS — E782 Mixed hyperlipidemia: Secondary | ICD-10-CM

## 2018-05-02 DIAGNOSIS — I739 Peripheral vascular disease, unspecified: Secondary | ICD-10-CM | POA: Diagnosis not present

## 2018-05-02 DIAGNOSIS — I6529 Occlusion and stenosis of unspecified carotid artery: Secondary | ICD-10-CM | POA: Diagnosis not present

## 2018-05-02 NOTE — Patient Instructions (Signed)
Medication Instructions:  Your physician recommends that you continue on your current medications as directed. Please refer to the Current Medication list given to you today.  If you need a refill on your cardiac medications before your next appointment, please call your pharmacy.   Lab work: none If you have labs (blood work) drawn today and your tests are completely normal, you will receive your results only by: Marland Kitchen MyChart Message (if you have MyChart) OR . A paper copy in the mail If you have any lab test that is abnormal or we need to change your treatment, we will call you to review the results.  Testing/Procedures: none  Follow-Up: At [facility], you and your health needs are our priority.  As part of our continuing mission to provide you with exceptional heart care, we have created designated Provider Care Teams.  These Care Teams include your primary Cardiologist (physician) and Advanced Practice Providers (APPs -  Physician Assistants and Nurse Practitioners) who all work together to provide you with the care you need, when you need it. You will need a follow up appointment in:  6 months.  Please call our office 2 months in advance to schedule this appointment.  You may see Dorris Carnes, MD or one of the following Advanced Practice Providers on your designated Care Team: Richardson Dopp, PA-C [place], [place] . Daune Perch, NP  Any Other Special Instructions Will Be Listed Below (If Applicable).
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
## 2018-05-05 ENCOUNTER — Ambulatory Visit
Admission: RE | Admit: 2018-05-05 | Discharge: 2018-05-05 | Disposition: A | Discharge status: Home / Self Care | Payer: Medicare HMO | Source: Ambulatory Visit | Attending: Neurosurgery | Admitting: Neurosurgery

## 2018-05-05 DIAGNOSIS — M4804 Spinal stenosis, thoracic region: Secondary | ICD-10-CM

## 2018-05-05 MED ORDER — IOPAMIDOL (ISOVUE-M 300) INJECTION 61%
10.0000 mL | Freq: Once | INTRAMUSCULAR | Status: AC | PRN
  Administered 2018-05-05: 10 mL via INTRATHECAL

## 2018-05-05 MED ORDER — DIAZEPAM 5 MG PO TABS
5.0000 mg | ORAL_TABLET | Freq: Once | ORAL | Status: AC
  Administered 2018-05-05: 5 mg via ORAL

## 2018-05-05 NOTE — Discharge Instructions (Signed)
Myelogram Discharge Instructions  1. Go home and rest quietly for the next 24 hours.  It is important to lie flat for the next 24 hours.  Get up only to go to the restroom.  You may lie in the bed or on a couch on your back, your stomach, your left side or your right side.  You may have one pillow under your head.  You may have pillows between your knees while you are on your side or under your knees while you are on your back.  2. DO NOT drive today.  Recline the seat as far back as it will go, while still wearing your seat belt, on the way home.  3. You may get up to go to the bathroom as needed.  You may sit up for 10 minutes to eat.  You may resume your normal diet and medications unless otherwise indicated.  Drink lots of extra fluids today and tomorrow.  4. The incidence of headache, nausea, or vomiting is about 5% (one in 20 patients).  If you develop a headache, lie flat and drink plenty of fluids until the headache goes away.  Caffeinated beverages may be helpful.  If you develop severe nausea and vomiting or a headache that does not go away with flat bed rest, call [phone].  5. You may resume normal activities after your 24 hours of bed rest is over; however, do not exert yourself strongly or do any heavy lifting tomorrow. If when you get up you have a headache when standing, go back to bed and force fluids for another 24 hours.  6. Call your physician for a follow-up appointment.  The results of your myelogram will be sent directly to your physician by the following day.  7. If you have any questions or if complications develop after you arrive home, please call [phone].  Discharge instructions have been explained to the patient.  The patient, or the person responsible for the patient, fully understands these instructions.  YOU MAY RESTART YOUR PLAVIX TODAY. YOU MAY RESTART YOUR [facility] TOMORROW 05/06/2018 AT 10:30AM.

## 2018-05-06 ENCOUNTER — Ambulatory Visit
Admission: RE | Admit: 2018-05-06 | Discharge: 2018-05-06 | Disposition: A | Discharge status: Home / Self Care | Payer: Medicare HMO | Source: Ambulatory Visit | Attending: Family Medicine | Admitting: Family Medicine

## 2018-05-06 DIAGNOSIS — N200 Calculus of kidney: Secondary | ICD-10-CM | POA: Diagnosis not present

## 2018-05-06 DIAGNOSIS — K7689 Other specified diseases of liver: Secondary | ICD-10-CM

## 2018-05-06 MED ORDER — IOPAMIDOL (ISOVUE-300) INJECTION 61%
75.0000 mL | Freq: Once | INTRAVENOUS | Status: AC | PRN
  Administered 2018-05-06: 75 mL via INTRAVENOUS

## 2018-05-09 ENCOUNTER — Encounter: Payer: Medicare HMO | Admitting: *Deleted

## 2018-05-09 VITALS — BP 134/70 | HR 67 | Temp 97.6°F | Wt 204.6 lb

## 2018-05-09 DIAGNOSIS — Z006 Encounter for examination for normal comparison and control in clinical research program: Secondary | ICD-10-CM

## 2018-05-09 NOTE — Research (Signed)
Subject to research clinic for day 90 in the [place] 8 research study.  No cos, aes or saes to report.  Injection given and subject remained in clinic for 30 minutes for observation per protocol. Next appointment scheduled.

## 2018-05-12 DIAGNOSIS — M4804 Spinal stenosis, thoracic region: Secondary | ICD-10-CM | POA: Diagnosis not present

## 2018-05-13 ENCOUNTER — Telehealth: Payer: Self-pay | Admitting: *Deleted

## 2018-05-13 ENCOUNTER — Other Ambulatory Visit: Payer: Self-pay | Admitting: Neurosurgery

## 2018-05-13 NOTE — Telephone Encounter (Signed)
   [facility] Pre-operative Risk Assessment    Request for surgical clearance:  1. What type of surgery is being performed? RIGHT T6-T7 MICRODISKECTOMY   2. When is this surgery scheduled? TBD   3. Are there any medications that need to be held prior to surgery and how long? PLAVIX   4. Practice name and name of physician performing surgery? [facility]; DR. POOL   5. What is your office phone and fax number? P# [phone] F# [phone]   6. Anesthesia type (None, local, MAC, general) ? GENERAL   Julaine Hua 05/13/2018, 8:38 AM  _________________________________________________________________   (provider comments below)

## 2018-05-13 NOTE — Telephone Encounter (Signed)
   Primary Cardiologist: Dorris Carnes, MD  Chart reviewed as part of pre-operative protocol coverage. Given past medical history and time since last visit, based on ACC/AHA guidelines, [name] would be at acceptable risk for the planned procedure without further cardiovascular testing.   Ok to hold plavix for 5 days prior to procedure   I will route this recommendation to the requesting party via [facility] fax function and remove from pre-op pool.  Please call with questions.  Cecilie Kicks, NP 05/13/2018, 2:08 PM

## 2018-05-13 NOTE — Telephone Encounter (Signed)
OK to hold plavix ?

## 2018-05-13 NOTE — Telephone Encounter (Signed)
Dr. Harrington Challenger you had cleared pt for myelogram , is it fine to clear for  Right T6-T7 Microdiskectomy with general anesthesia?  And can he hold plavix for 5 days?

## 2018-05-16 ENCOUNTER — Other Ambulatory Visit: Payer: Self-pay | Admitting: Family Medicine

## 2018-05-16 MED ORDER — OXYCODONE-ACETAMINOPHEN 10-325 MG PO TABS: 1.0000 | ORAL_TABLET | ORAL | 0 refills | Status: DC | PRN

## 2018-05-16 NOTE — Telephone Encounter (Signed)
Patient requesting a refill on Oxycodone     LOV: 03/25/18  LRF:  04/14/18

## 2018-05-19 ENCOUNTER — Encounter: Payer: Self-pay | Admitting: Family Medicine

## 2018-05-19 DIAGNOSIS — M5104 Intervertebral disc disorders with myelopathy, thoracic region: Secondary | ICD-10-CM | POA: Insufficient documentation

## 2018-05-25 ENCOUNTER — Other Ambulatory Visit: Payer: Self-pay | Admitting: Allergy and Immunology

## 2018-05-25 DIAGNOSIS — L500 Allergic urticaria: Secondary | ICD-10-CM

## 2018-05-25 DIAGNOSIS — T7840XD Allergy, unspecified, subsequent encounter: Secondary | ICD-10-CM

## 2018-06-01 NOTE — Pre-Procedure Instructions (Signed)
[name] Bottom Jr.  06/01/2018      [id number] [place], [place] - [address] [place] [address] Phone: [phone] Fax: [phone]    Your procedure is scheduled on 06/10/18.  Report to [facility] Admitting at 6 A.M.  Call this number if you have problems the morning of surgery:  [phone]   Remember:  Do not eat or drink after midnight.     Take these medicines the morning of surgery with A SIP OF WATER ---all inhalers,cardura,metoprolol,eye drops,oxycodone    Do not wear jewelry, make-up or nail polish.  Do not wear lotions, powders, or perfumes, or deodorant.  Do not shave 48 hours prior to surgery.  Men may shave face and neck.  Do not bring valuables to the hospital.  [facility] is not responsible for any belongings or valuables.  Contacts, dentures or bridgework may not be worn into surgery.  Leave your suitcase in the car.  After surgery it may be brought to your room.  For patients admitted to the hospital, discharge time will be determined by your treatment team.  Patients discharged the day of surgery will not be allowed to drive home.   Name and phone number of your driver:   Do not take any aspirin,anti-inflammatories,vitamins,or herbal supplements 5-7 days prior to surgery. Special instructions:   - Preparing for Surgery  Before surgery, you can play an important role.  Because skin is not sterile, your skin needs to be as free of germs as possible.  You can reduce the number of germs on you skin by washing with CHG (chlorahexidine gluconate) soap before surgery.  CHG is an antiseptic cleaner which kills germs and bonds with the skin to continue killing germs even after washing.  Oral Hygiene is also important in reducing the risk of infection.  Remember to brush your teeth with your regular toothpaste the morning of surgery.  Please DO NOT use if you have an allergy  to CHG or antibacterial soaps.  If your skin becomes reddened/irritated stop using the CHG and inform your nurse when you arrive at Short Stay.  Do not shave (including legs and underarms) for at least 48 hours prior to the first CHG shower.  You may shave your face.  Please follow these instructions carefully:   1.  Shower with CHG Soap the night before surgery and the morning of Surgery.  2.  If you choose to wash your hair, wash your hair first as usual with your normal shampoo.  3.  After you shampoo, rinse your hair and body thoroughly to remove the shampoo. 4.  Use CHG as you would any other liquid soap.  You can apply chg directly to the skin and wash gently with a      scrungie or washcloth.           5.  Apply the CHG Soap to your body ONLY FROM THE NECK DOWN.   Do not use on open wounds or open sores. Avoid contact with your eyes, ears, mouth and genitals (private parts).  Wash genitals (private parts) with your normal soap.  6.  Wash thoroughly, paying special attention to the area where your surgery will be performed.  7.  Thoroughly rinse your body with warm water from the neck down.  8.  DO NOT shower/wash with your normal soap after using and rinsing off the CHG Soap.  9.  Pat yourself dry with a clean towel.            10.  Wear clean pajamas.            11.  Place clean sheets on your bed the night of your first shower and do not sleep with pets.  Day of Surgery  Do not apply any lotions/deoderants the morning of surgery.   Please wear clean clothes to the hospital/surgery center. Remember to brush your teeth with toothpaste.    Please read over the following fact sheets that you were given. MRSA Information

## 2018-06-02 ENCOUNTER — Encounter (HOSPITAL_COMMUNITY)
Admission: RE | Admit: 2018-06-02 | Discharge: 2018-06-02 | Disposition: A | Discharge status: Home / Self Care | Payer: Medicare HMO | Source: Ambulatory Visit | Attending: Neurosurgery | Admitting: Neurosurgery

## 2018-06-02 ENCOUNTER — Encounter (HOSPITAL_COMMUNITY): Payer: Self-pay

## 2018-06-02 ENCOUNTER — Other Ambulatory Visit: Payer: Self-pay

## 2018-06-02 DIAGNOSIS — Z01812 Encounter for preprocedural laboratory examination: Secondary | ICD-10-CM | POA: Diagnosis not present

## 2018-06-02 HISTORY — DX: Type 2 diabetes mellitus without complications: E11.9

## 2018-06-02 HISTORY — DX: Unspecified hearing loss, unspecified ear: H91.90

## 2018-06-02 LAB — CBC WITH DIFFERENTIAL/PLATELET
ABS IMMATURE GRANULOCYTES: 0.01 10*3/uL (ref 0.00–0.07)
Basophils Absolute: 0 10*3/uL (ref 0.0–0.1)
Basophils Relative: 0 %
Eosinophils Absolute: 0.1 10*3/uL (ref 0.0–0.5)
Eosinophils Relative: 1 %
HCT: 43.1 % (ref 39.0–52.0)
HEMOGLOBIN: 13.7 g/dL (ref 13.0–17.0)
Immature Granulocytes: 0 %
Lymphocytes Relative: 37 %
Lymphs Abs: 1.6 10*3/uL (ref 0.7–4.0)
MCH: 29.6 pg (ref 26.0–34.0)
MCHC: 31.8 g/dL (ref 30.0–36.0)
MCV: 93.1 fL (ref 80.0–100.0)
MONO ABS: 0.5 10*3/uL (ref 0.1–1.0)
MONOS PCT: 11 %
Neutro Abs: 2.2 10*3/uL (ref 1.7–7.7)
Neutrophils Relative %: 51 %
Platelets: 106 10*3/uL — ABNORMAL LOW (ref 150–400)
RBC: 4.63 MIL/uL (ref 4.22–5.81)
RDW: 14 % (ref 11.5–15.5)
WBC: 4.4 10*3/uL (ref 4.0–10.5)
nRBC: 0 % (ref 0.0–0.2)

## 2018-06-02 LAB — SURGICAL PCR SCREEN
MRSA, PCR: NEGATIVE
Staphylococcus aureus: NEGATIVE

## 2018-06-02 LAB — BASIC METABOLIC PANEL
ANION GAP: 8 (ref 5–15)
BUN: 14 mg/dL (ref 8–23)
CO2: 24 mmol/L (ref 22–32)
Calcium: 9.3 mg/dL (ref 8.9–10.3)
Chloride: 107 mmol/L (ref 98–111)
Creatinine, Ser: 0.99 mg/dL (ref 0.61–1.24)
GFR calc Af Amer: >60 mL/min (ref >60)
GLUCOSE: 109 mg/dL — AB (ref 70–99)
Potassium: 4.3 mmol/L (ref 3.5–5.1)
SODIUM: 139 mmol/L (ref 135–145)

## 2018-06-02 LAB — HEMOGLOBIN A1C
Hgb A1c MFr Bld: 5.3 % (ref 4.8–5.6)
MEAN PLASMA GLUCOSE: 105.41 mg/dL

## 2018-06-02 LAB — GLUCOSE, CAPILLARY: Glucose-Capillary: 95 mg/dL (ref 70–99)

## 2018-06-02 NOTE — Pre-Procedure Instructions (Signed)
[name] Bottom Jr.  06/02/2018      [id number] [place], [place] - [address] [place] [address] Phone: [phone] Fax: [phone]    Your procedure is scheduled on Friday,  06/10/18.   Report to [facility] Admitting at 6 A.M.             (posted surgery time 8AM - 9:27A )   Call this number if you have problems the morning of surgery:  [phone]   Remember:   Do not eat any foods or drink any liquids after midnight, Thursday.     Take these medicines the morning of surgery with A SIP OF WATER ---all inhalers,cardura,metoprolol,eye drops,oxycodone    Do not wear jewelry - NO RINGS  Do not wear lotions, colognes or deodorant.  Men may shave face and neck.  Do not bring valuables to the hospital.  [facility] is not responsible for any belongings or valuables.  Contacts, dentures or bridgework may not be worn into surgery.  Leave your suitcase in the car.  After surgery it may be brought to your room.  For patients admitted to the hospital, discharge time will be determined by your treatment team.  Patients discharged the day of surgery will not be allowed to drive home, and will need someone to stay with you for the first 24 hrs.  7 days prior to surgery, STOP TAKING any Vitamins, Herbal Supplements, Anti-inflammatories, Blood Thinners.    Special instructions:  [facility] - Preparing for Surgery  Before surgery, you can play an important role.  Because skin is not sterile, your skin needs to be as free of germs as possible.  You can reduce the number of germs on you skin by washing with CHG (chlorahexidine gluconate) soap before surgery.  CHG is an antiseptic cleaner which kills germs and bonds with the skin to continue killing germs even after washing.    Oral Hygiene is also important in reducing the risk of infection.  Remember to brush your teeth with your regular toothpaste the  morning of surgery.  Please DO NOT use if you have an allergy to CHG or antibacterial soaps.  If your skin becomes reddened/irritated stop using the CHG and inform your nurse when you arrive at Short Stay.  Do not shave (including legs and underarms) for at least 48 hours prior to the first CHG shower.  You may shave your face.  Please follow these instructions carefully:   1.  Shower with CHG Soap the night before surgery and the morning of Surgery.  2.  If you choose to wash your hair, wash your hair first as usual with your normal shampoo.  3.  After you shampoo, rinse your hair and body thoroughly to remove the shampoo. 4.  Use CHG as you would any other liquid soap.  You can apply chg directly to the skin and wash gently with a      scrungie or washcloth.           5.  Apply the CHG Soap to your body ONLY FROM THE NECK DOWN.   Do not use on open wounds or open sores. Avoid contact with your eyes, ears, mouth and genitals (private parts).  Wash genitals (private parts) with your normal soap.  6.  Wash thoroughly, paying special attention to the area where your surgery will be performed.  7.  Thoroughly rinse your body  with warm water from the neck down.  8.  DO NOT shower/wash with your normal soap after using and rinsing off the CHG Soap.  9.  Pat yourself dry with a clean towel.            10.  Wear clean pajamas.            11.  Place clean sheets on your bed the night of your first shower and do not sleep with pets.  Day of Surgery      Do not apply any lotions/deoderants the morning of surgery.   Please wear clean clothes to the hospital/surgery center. Remember to brush your teeth with toothpaste.  Please read over the following fact sheets that you were given.

## 2018-06-02 NOTE — Progress Notes (Addendum)
PCP is Dr. Raford Pitcher  LOV 03/2018.  He also manages his 'diabetes'.  Patient takes glucophage, but doesn't check his sugar.  He has no idea what the low or high's are.  Dr.Paula Harrington Challenger is cardio.  Her notes state to stop his plavix 5 days prior to surgery, so last day will be on 12/8.  I have instructed patient on this.  He also take majority of his bp/heart meds at night.  I instructed him to keep his normal schedule, but to take his inhalers the DOS. He denies CP, murmur.  Does have a 'smokers' cough.

## 2018-06-03 NOTE — Anesthesia Preprocedure Evaluation (Addendum)
Anesthesia Evaluation  Patient identified by MRN, date of birth, ID band Patient awake    Reviewed: Allergy & Precautions, H&P , NPO status , Patient's Chart, lab work & pertinent test results, reviewed documented beta blocker date and time   Airway Mallampati: II  TM Distance: >3 FB Neck ROM: Full    Dental no notable dental hx. (+) Edentulous Upper, Dental Advisory Given   Pulmonary COPD,  COPD inhaler, Current Smoker,    Pulmonary exam normal breath sounds clear to auscultation       Cardiovascular hypertension, Pt. on medications and Pt. on home beta blockers + CAD, + Cardiac Stents and + Peripheral Vascular Disease   Rhythm:Regular Rate:Normal     Neuro/Psych negative neurological ROS  negative psych ROS   GI/Hepatic negative GI ROS, Neg liver ROS,   Endo/Other  diabetes, Type 2, Oral Hypoglycemic Agents  Renal/GU negative Renal ROS  negative genitourinary   Musculoskeletal  (+) Arthritis , Osteoarthritis,    Abdominal   Peds  Hematology negative hematology ROS (+)   Anesthesia Other Findings   Reproductive/Obstetrics negative OB ROS                            Anesthesia Physical Anesthesia Plan  ASA: III  Anesthesia Plan: General   Post-op Pain Management:    Induction: Intravenous  PONV Risk Score and Plan: 2 and Ondansetron and Dexamethasone  Airway Management Planned: Oral ETT  Additional Equipment:   Intra-op Plan:   Post-operative Plan: Extubation in OR  Informed Consent: I have reviewed the patients History and Physical, chart, labs and discussed the procedure including the risks, benefits and alternatives for the proposed anesthesia with the patient or authorized representative who has indicated his/her understanding and acceptance.   Dental advisory given  Plan Discussed with: CRNA  Anesthesia Plan Comments: (Follows with Dr. Harrington Challenger for hx of CAD s/p BMS to  RCA in 2007. Cardiac clearance by Cecilie Kicks, NP 05/13/2018. Hold plavix for 5 days prior to procedure.  Follows with Dr. Donnetta Hutching for PAD s/p axillofemoral and femorofemoral bypass in 2006. Per note 03/15/18 "I explained there is no evidence of any flow limitation from his old axillary femoral and femoral to about femoral bypass from 2006.  Suspect that his symptoms are related to severe degenerative disc disease. He will see [place] again on an as-needed basis.")     Anesthesia Quick Evaluation

## 2018-06-10 ENCOUNTER — Ambulatory Visit (HOSPITAL_COMMUNITY): Payer: Medicare HMO | Admitting: Anesthesiology

## 2018-06-10 ENCOUNTER — Inpatient Hospital Stay (HOSPITAL_COMMUNITY)
Admission: AD | Disposition: A | Discharge status: Home / Self Care | Payer: Self-pay | Source: Ambulatory Visit | Attending: Neurosurgery

## 2018-06-10 ENCOUNTER — Encounter (HOSPITAL_COMMUNITY): Payer: Self-pay | Admitting: General Practice

## 2018-06-10 ENCOUNTER — Other Ambulatory Visit: Payer: Self-pay

## 2018-06-10 ENCOUNTER — Ambulatory Visit (HOSPITAL_COMMUNITY): Payer: Medicare HMO

## 2018-06-10 ENCOUNTER — Inpatient Hospital Stay (HOSPITAL_COMMUNITY)
Admission: AD | Admit: 2018-06-10 | Discharge: 2018-06-11 | DRG: 520 | Disposition: A | Discharge status: Home / Self Care | Payer: Medicare HMO | Source: Ambulatory Visit | Attending: Neurosurgery | Admitting: Neurosurgery

## 2018-06-10 ENCOUNTER — Ambulatory Visit (HOSPITAL_COMMUNITY): Payer: Medicare HMO | Admitting: Physician Assistant

## 2018-06-10 DIAGNOSIS — Z885 Allergy status to narcotic agent status: Secondary | ICD-10-CM | POA: Diagnosis not present

## 2018-06-10 DIAGNOSIS — E785 Hyperlipidemia, unspecified: Secondary | ICD-10-CM | POA: Diagnosis not present

## 2018-06-10 DIAGNOSIS — Z91013 Allergy to seafood: Secondary | ICD-10-CM

## 2018-06-10 DIAGNOSIS — Z79899 Other long term (current) drug therapy: Secondary | ICD-10-CM | POA: Diagnosis not present

## 2018-06-10 DIAGNOSIS — M5104 Intervertebral disc disorders with myelopathy, thoracic region: Secondary | ICD-10-CM | POA: Diagnosis not present

## 2018-06-10 DIAGNOSIS — I2510 Atherosclerotic heart disease of native coronary artery without angina pectoris: Secondary | ICD-10-CM | POA: Diagnosis present

## 2018-06-10 DIAGNOSIS — J449 Chronic obstructive pulmonary disease, unspecified: Secondary | ICD-10-CM | POA: Diagnosis present

## 2018-06-10 DIAGNOSIS — M5124 Other intervertebral disc displacement, thoracic region: Secondary | ICD-10-CM | POA: Diagnosis not present

## 2018-06-10 DIAGNOSIS — Z7902 Long term (current) use of antithrombotics/antiplatelets: Secondary | ICD-10-CM

## 2018-06-10 DIAGNOSIS — Z7984 Long term (current) use of oral hypoglycemic drugs: Secondary | ICD-10-CM

## 2018-06-10 DIAGNOSIS — Z419 Encounter for procedure for purposes other than remedying health state, unspecified: Secondary | ICD-10-CM

## 2018-06-10 DIAGNOSIS — Z8551 Personal history of malignant neoplasm of bladder: Secondary | ICD-10-CM | POA: Diagnosis not present

## 2018-06-10 DIAGNOSIS — I10 Essential (primary) hypertension: Secondary | ICD-10-CM | POA: Diagnosis present

## 2018-06-10 DIAGNOSIS — H9190 Unspecified hearing loss, unspecified ear: Secondary | ICD-10-CM | POA: Diagnosis present

## 2018-06-10 DIAGNOSIS — M4804 Spinal stenosis, thoracic region: Secondary | ICD-10-CM | POA: Diagnosis present

## 2018-06-10 DIAGNOSIS — F17210 Nicotine dependence, cigarettes, uncomplicated: Secondary | ICD-10-CM | POA: Diagnosis not present

## 2018-06-10 DIAGNOSIS — Z955 Presence of coronary angioplasty implant and graft: Secondary | ICD-10-CM

## 2018-06-10 DIAGNOSIS — Z888 Allergy status to other drugs, medicaments and biological substances status: Secondary | ICD-10-CM

## 2018-06-10 DIAGNOSIS — E1151 Type 2 diabetes mellitus with diabetic peripheral angiopathy without gangrene: Secondary | ICD-10-CM | POA: Diagnosis not present

## 2018-06-10 DIAGNOSIS — Z981 Arthrodesis status: Secondary | ICD-10-CM | POA: Diagnosis not present

## 2018-06-10 HISTORY — PX: LUMBAR LAMINECTOMY/DECOMPRESSION MICRODISCECTOMY: SHX5026

## 2018-06-10 LAB — GLUCOSE, CAPILLARY
GLUCOSE-CAPILLARY: 97 mg/dL (ref 70–99)
Glucose-Capillary: 91 mg/dL (ref 70–99)
Glucose-Capillary: 257 mg/dL — ABNORMAL HIGH (ref 70–99)

## 2018-06-10 LAB — PROTIME-INR
INR: 1.15
Prothrombin Time: 14.6 seconds (ref 11.4–15.2)

## 2018-06-10 SURGERY — LUMBAR LAMINECTOMY/DECOMPRESSION MICRODISCECTOMY 1 LEVEL
Anesthesia: General | Site: Back | Laterality: Right

## 2018-06-10 MED ORDER — MOMETASONE FURO-FORMOTEROL FUM 200-5 MCG/ACT IN AERO
2.0000 | INHALATION_SPRAY | Freq: Two times a day (BID) | RESPIRATORY_TRACT | Status: DC
  Administered 2018-06-11: 2 via RESPIRATORY_TRACT
  Filled 2018-06-10: qty 8.8

## 2018-06-10 MED ORDER — KETOROLAC TROMETHAMINE 30 MG/ML IJ SOLN
INTRAMUSCULAR | Status: DC | PRN
  Administered 2018-06-10: 30 mg via INTRAVENOUS

## 2018-06-10 MED ORDER — ATORVASTATIN CALCIUM 80 MG PO TABS
80.0000 mg | ORAL_TABLET | Freq: Every day | ORAL | Status: DC
  Administered 2018-06-10: 80 mg via ORAL
  Filled 2018-06-10: qty 1

## 2018-06-10 MED ORDER — KETOROLAC TROMETHAMINE 15 MG/ML IJ SOLN
30.0000 mg | Freq: Four times a day (QID) | INTRAMUSCULAR | Status: DC
  Administered 2018-06-10 – 2018-06-11 (×3): 30 mg via INTRAVENOUS
  Filled 2018-06-10 (×3): qty 2

## 2018-06-10 MED ORDER — LORATADINE 10 MG PO TABS
10.0000 mg | ORAL_TABLET | Freq: Every day | ORAL | Status: DC
  Administered 2018-06-10: 10 mg via ORAL
  Filled 2018-06-10: qty 1

## 2018-06-10 MED ORDER — SODIUM CHLORIDE 0.9 % IV SOLN: 250.0000 mL | INTRAVENOUS | Status: DC

## 2018-06-10 MED ORDER — ONDANSETRON HCL 4 MG/2ML IJ SOLN: 4.0000 mg | Freq: Four times a day (QID) | INTRAMUSCULAR | Status: DC | PRN

## 2018-06-10 MED ORDER — LACTATED RINGERS IV SOLN
INTRAVENOUS | Status: DC
  Administered 2018-06-10: 14:00:00 via INTRAVENOUS

## 2018-06-10 MED ORDER — HYDROMORPHONE HCL 1 MG/ML IJ SOLN
0.2500 mg | INTRAMUSCULAR | Status: DC | PRN
  Administered 2018-06-10: 0.5 mg via INTRAVENOUS

## 2018-06-10 MED ORDER — CEFAZOLIN SODIUM-DEXTROSE 2-4 GM/100ML-% IV SOLN
INTRAVENOUS | Status: AC
  Filled 2018-06-10: qty 100

## 2018-06-10 MED ORDER — ACETAMINOPHEN 325 MG PO TABS: 650.0000 mg | ORAL_TABLET | ORAL | Status: DC | PRN

## 2018-06-10 MED ORDER — THROMBIN (RECOMBINANT) 5000 UNITS EX SOLR
CUTANEOUS | Status: AC
  Filled 2018-06-10: qty 5000

## 2018-06-10 MED ORDER — PROPOFOL 10 MG/ML IV BOLUS
INTRAVENOUS | Status: DC | PRN
  Administered 2018-06-10: 150 mg via INTRAVENOUS

## 2018-06-10 MED ORDER — EZETIMIBE 10 MG PO TABS
10.0000 mg | ORAL_TABLET | Freq: Every evening | ORAL | Status: DC
  Administered 2018-06-10: 10 mg via ORAL
  Filled 2018-06-10: qty 1

## 2018-06-10 MED ORDER — ROCURONIUM BROMIDE 50 MG/5ML IV SOSY
PREFILLED_SYRINGE | INTRAVENOUS | Status: DC | PRN
  Administered 2018-06-10: 50 mg via INTRAVENOUS
  Administered 2018-06-10: 10 mg via INTRAVENOUS

## 2018-06-10 MED ORDER — CHLORHEXIDINE GLUCONATE CLOTH 2 % EX PADS: 6.0000 | MEDICATED_PAD | Freq: Once | CUTANEOUS | Status: DC

## 2018-06-10 MED ORDER — ONDANSETRON HCL 4 MG/2ML IJ SOLN
INTRAMUSCULAR | Status: AC
  Filled 2018-06-10: qty 2

## 2018-06-10 MED ORDER — PHENOL 1.4 % MT LIQD: 1.0000 | OROMUCOSAL | Status: DC | PRN

## 2018-06-10 MED ORDER — SUGAMMADEX SODIUM 200 MG/2ML IV SOLN
INTRAVENOUS | Status: DC | PRN
  Administered 2018-06-10: 200 mg via INTRAVENOUS

## 2018-06-10 MED ORDER — ONDANSETRON HCL 4 MG/2ML IJ SOLN
INTRAMUSCULAR | Status: DC | PRN
  Administered 2018-06-10: 4 mg via INTRAVENOUS

## 2018-06-10 MED ORDER — DEXAMETHASONE SODIUM PHOSPHATE 10 MG/ML IJ SOLN
10.0000 mg | INTRAMUSCULAR | Status: AC
  Administered 2018-06-10: 10 mg via INTRAVENOUS

## 2018-06-10 MED ORDER — LEVOCETIRIZINE DIHYDROCHLORIDE 5 MG PO TABS: 5.0000 mg | ORAL_TABLET | Freq: Every evening | ORAL | Status: DC

## 2018-06-10 MED ORDER — KETOROLAC TROMETHAMINE 30 MG/ML IJ SOLN
INTRAMUSCULAR | Status: AC
  Filled 2018-06-10: qty 1

## 2018-06-10 MED ORDER — THROMBIN 5000 UNITS EX SOLR
OROMUCOSAL | Status: DC | PRN
  Administered 2018-06-10: 16:00:00 via TOPICAL

## 2018-06-10 MED ORDER — OXYCODONE-ACETAMINOPHEN 10-325 MG PO TABS: 1.0000 | ORAL_TABLET | ORAL | Status: DC | PRN

## 2018-06-10 MED ORDER — OFLOXACIN 0.3 % OP SOLN
1.0000 [drp] | Freq: Four times a day (QID) | OPHTHALMIC | Status: DC | PRN
  Filled 2018-06-10: qty 5

## 2018-06-10 MED ORDER — THROMBIN 5000 UNITS EX SOLR
CUTANEOUS | Status: AC
  Filled 2018-06-10: qty 5000

## 2018-06-10 MED ORDER — DEXAMETHASONE SODIUM PHOSPHATE 4 MG/ML IJ SOLN
4.0000 mg | Freq: Four times a day (QID) | INTRAMUSCULAR | Status: DC
  Administered 2018-06-10 – 2018-06-11 (×2): 4 mg via INTRAVENOUS
  Filled 2018-06-10 (×2): qty 1

## 2018-06-10 MED ORDER — SODIUM CHLORIDE 0.9% FLUSH: 3.0000 mL | INTRAVENOUS | Status: DC | PRN

## 2018-06-10 MED ORDER — DOXAZOSIN MESYLATE 4 MG PO TABS
4.0000 mg | ORAL_TABLET | Freq: Every evening | ORAL | Status: DC
  Administered 2018-06-10: 4 mg via ORAL
  Filled 2018-06-10: qty 1

## 2018-06-10 MED ORDER — MENTHOL 3 MG MT LOZG: 1.0000 | LOZENGE | OROMUCOSAL | Status: DC | PRN

## 2018-06-10 MED ORDER — LOSARTAN POTASSIUM 50 MG PO TABS
50.0000 mg | ORAL_TABLET | Freq: Every evening | ORAL | Status: DC
  Administered 2018-06-10: 50 mg via ORAL
  Filled 2018-06-10: qty 1

## 2018-06-10 MED ORDER — ACETAMINOPHEN 650 MG RE SUPP: 650.0000 mg | RECTAL | Status: DC | PRN

## 2018-06-10 MED ORDER — LIDOCAINE HCL (CARDIAC) PF 100 MG/5ML IV SOSY
PREFILLED_SYRINGE | INTRAVENOUS | Status: DC | PRN
  Administered 2018-06-10: 30 mg via INTRAVENOUS

## 2018-06-10 MED ORDER — FLUTICASONE PROPIONATE 50 MCG/ACT NA SUSP
1.0000 | Freq: Two times a day (BID) | NASAL | Status: DC | PRN
  Filled 2018-06-10: qty 16

## 2018-06-10 MED ORDER — SODIUM CHLORIDE 0.9% FLUSH
3.0000 mL | Freq: Two times a day (BID) | INTRAVENOUS | Status: DC
  Administered 2018-06-10: 3 mL via INTRAVENOUS

## 2018-06-10 MED ORDER — 0.9 % SODIUM CHLORIDE (POUR BTL) OPTIME
TOPICAL | Status: DC | PRN
  Administered 2018-06-10: 1000 mL

## 2018-06-10 MED ORDER — LIDOCAINE 2% (20 MG/ML) 5 ML SYRINGE
INTRAMUSCULAR | Status: AC
  Filled 2018-06-10: qty 5

## 2018-06-10 MED ORDER — CEFAZOLIN SODIUM-DEXTROSE 1-4 GM/50ML-% IV SOLN
1.0000 g | Freq: Three times a day (TID) | INTRAVENOUS | Status: AC
  Administered 2018-06-10 – 2018-06-11 (×2): 1 g via INTRAVENOUS
  Filled 2018-06-10 (×2): qty 50

## 2018-06-10 MED ORDER — BUPIVACAINE HCL (PF) 0.25 % IJ SOLN
INTRAMUSCULAR | Status: DC | PRN
  Administered 2018-06-10: 30 mL

## 2018-06-10 MED ORDER — HYDROMORPHONE HCL 1 MG/ML IJ SOLN
INTRAMUSCULAR | Status: AC
  Administered 2018-06-10: 0.5 mg via INTRAVENOUS
  Filled 2018-06-10: qty 1

## 2018-06-10 MED ORDER — HYDROCODONE-ACETAMINOPHEN 10-325 MG PO TABS: 2.0000 | ORAL_TABLET | ORAL | Status: DC | PRN

## 2018-06-10 MED ORDER — ONDANSETRON HCL 4 MG PO TABS: 4.0000 mg | ORAL_TABLET | Freq: Four times a day (QID) | ORAL | Status: DC | PRN

## 2018-06-10 MED ORDER — LACTATED RINGERS IV SOLN
INTRAVENOUS | Status: DC | PRN
  Administered 2018-06-10 (×2): via INTRAVENOUS

## 2018-06-10 MED ORDER — METFORMIN HCL 850 MG PO TABS
850.0000 mg | ORAL_TABLET | Freq: Two times a day (BID) | ORAL | Status: DC
  Administered 2018-06-11: 850 mg via ORAL
  Filled 2018-06-10: qty 1

## 2018-06-10 MED ORDER — CEFAZOLIN SODIUM-DEXTROSE 2-4 GM/100ML-% IV SOLN
2.0000 g | INTRAVENOUS | Status: AC
  Administered 2018-06-10: 2 g via INTRAVENOUS

## 2018-06-10 MED ORDER — DEXAMETHASONE 4 MG PO TABS
4.0000 mg | ORAL_TABLET | Freq: Four times a day (QID) | ORAL | Status: DC
  Administered 2018-06-11: 4 mg via ORAL
  Filled 2018-06-10 (×2): qty 1

## 2018-06-10 MED ORDER — HYDROCODONE-ACETAMINOPHEN 5-325 MG PO TABS: 1.0000 | ORAL_TABLET | ORAL | Status: DC | PRN

## 2018-06-10 MED ORDER — CYCLOBENZAPRINE HCL 10 MG PO TABS: 10.0000 mg | ORAL_TABLET | Freq: Three times a day (TID) | ORAL | Status: DC | PRN

## 2018-06-10 MED ORDER — FENTANYL CITRATE (PF) 100 MCG/2ML IJ SOLN
INTRAMUSCULAR | Status: DC | PRN
  Administered 2018-06-10: 150 ug via INTRAVENOUS
  Administered 2018-06-10: 100 ug via INTRAVENOUS

## 2018-06-10 MED ORDER — EPHEDRINE 5 MG/ML INJ
INTRAVENOUS | Status: AC
  Filled 2018-06-10: qty 10

## 2018-06-10 MED ORDER — HYDROCHLOROTHIAZIDE 25 MG PO TABS
25.0000 mg | ORAL_TABLET | Freq: Every evening | ORAL | Status: DC
  Administered 2018-06-10: 25 mg via ORAL
  Filled 2018-06-10: qty 1

## 2018-06-10 MED ORDER — ACETAMINOPHEN 325 MG PO TABS: 325.0000 mg | ORAL_TABLET | ORAL | Status: DC | PRN

## 2018-06-10 MED ORDER — BUPIVACAINE HCL (PF) 0.25 % IJ SOLN
INTRAMUSCULAR | Status: AC
  Filled 2018-06-10: qty 30

## 2018-06-10 MED ORDER — ROCURONIUM BROMIDE 50 MG/5ML IV SOSY
PREFILLED_SYRINGE | INTRAVENOUS | Status: AC
  Filled 2018-06-10: qty 10

## 2018-06-10 MED ORDER — THROMBIN 5000 UNITS EX SOLR
CUTANEOUS | Status: DC | PRN
  Administered 2018-06-10: 5000 [IU] via TOPICAL

## 2018-06-10 MED ORDER — HYDROMORPHONE HCL 1 MG/ML IJ SOLN: 1.0000 mg | INTRAMUSCULAR | Status: DC | PRN

## 2018-06-10 MED ORDER — FENOFIBRATE 160 MG PO TABS
160.0000 mg | ORAL_TABLET | Freq: Every evening | ORAL | Status: DC
  Administered 2018-06-10: 160 mg via ORAL
  Filled 2018-06-10: qty 1

## 2018-06-10 MED ORDER — DEXAMETHASONE SODIUM PHOSPHATE 10 MG/ML IJ SOLN
INTRAMUSCULAR | Status: AC
  Filled 2018-06-10: qty 1

## 2018-06-10 MED ORDER — METOPROLOL SUCCINATE ER 25 MG PO TB24
25.0000 mg | ORAL_TABLET | Freq: Every evening | ORAL | Status: DC
  Administered 2018-06-10: 25 mg via ORAL
  Filled 2018-06-10: qty 1

## 2018-06-10 MED ORDER — SODIUM CHLORIDE 0.9 % IV SOLN
INTRAVENOUS | Status: DC | PRN
  Administered 2018-06-10: 16:00:00

## 2018-06-10 MED ORDER — OXYCODONE HCL 5 MG PO TABS: 10.0000 mg | ORAL_TABLET | ORAL | Status: DC | PRN

## 2018-06-10 SURGICAL SUPPLY — 55 items
BAG DECANTER FOR FLEXI CONT (MISCELLANEOUS) ×3 IMPLANT
BENZOIN TINCTURE PRP APPL 2/3 (GAUZE/BANDAGES/DRESSINGS) ×3 IMPLANT
BLADE CLIPPER SURG (BLADE) IMPLANT
BUR CUTTER 7.0 ROUND (BURR) ×3 IMPLANT
BUR MATCHSTICK NEURO 3.0 LAGG (BURR) ×3 IMPLANT
CANISTER SUCT 3000ML PPV (MISCELLANEOUS) ×3 IMPLANT
CARTRIDGE OIL MAESTRO DRILL (MISCELLANEOUS) ×1 IMPLANT
CLOSURE WOUND 1/2 X4 (GAUZE/BANDAGES/DRESSINGS) ×1
COVER WAND RF STERILE (DRAPES) IMPLANT
DECANTER SPIKE VIAL GLASS SM (MISCELLANEOUS) ×3 IMPLANT
DERMABOND ADVANCED (GAUZE/BANDAGES/DRESSINGS) ×2
DERMABOND ADVANCED .7 DNX12 (GAUZE/BANDAGES/DRESSINGS) ×1 IMPLANT
DIFFUSER DRILL AIR PNEUMATIC (MISCELLANEOUS) ×3 IMPLANT
DRAPE HALF SHEET 40X57 (DRAPES) ×3 IMPLANT
DRAPE LAPAROTOMY 100X72X124 (DRAPES) ×3 IMPLANT
DRAPE MICROSCOPE LEICA (MISCELLANEOUS) ×3 IMPLANT
DRAPE SURG 17X23 STRL (DRAPES) ×12 IMPLANT
DRSG OPSITE POSTOP 4X6 (GAUZE/BANDAGES/DRESSINGS) ×3 IMPLANT
DURAPREP 26ML APPLICATOR (WOUND CARE) ×3 IMPLANT
ELECT REM PT RETURN 9FT ADLT (ELECTROSURGICAL) ×3
ELECTRODE REM PT RTRN 9FT ADLT (ELECTROSURGICAL) ×1 IMPLANT
GAUZE 4X4 16PLY RFD (DISPOSABLE) IMPLANT
GAUZE SPONGE 4X4 12PLY STRL (GAUZE/BANDAGES/DRESSINGS) IMPLANT
GLOVE BIO SURGEON STRL SZ 6.5 (GLOVE) ×2 IMPLANT
GLOVE BIO SURGEONS STRL SZ 6.5 (GLOVE) ×1
GLOVE BIOGEL PI IND STRL 6.5 (GLOVE) ×2 IMPLANT
GLOVE BIOGEL PI IND STRL 7.5 (GLOVE) ×4 IMPLANT
GLOVE BIOGEL PI INDICATOR 6.5 (GLOVE) ×4
GLOVE BIOGEL PI INDICATOR 7.5 (GLOVE) ×8
GLOVE ECLIPSE 7.5 STRL STRAW (GLOVE) ×3 IMPLANT
GLOVE ECLIPSE 9.0 STRL (GLOVE) ×3 IMPLANT
GLOVE EXAM NITRILE XL STR (GLOVE) IMPLANT
GLOVE SURG SS PI 6.5 STRL IVOR (GLOVE) ×9 IMPLANT
GOWN STRL REUS W/ TWL LRG LVL3 (GOWN DISPOSABLE) ×2 IMPLANT
GOWN STRL REUS W/ TWL XL LVL3 (GOWN DISPOSABLE) ×2 IMPLANT
GOWN STRL REUS W/TWL 2XL LVL3 (GOWN DISPOSABLE) IMPLANT
GOWN STRL REUS W/TWL LRG LVL3 (GOWN DISPOSABLE) ×4
GOWN STRL REUS W/TWL XL LVL3 (GOWN DISPOSABLE) ×4
HEMOSTAT POWDER KIT SURGIFOAM (HEMOSTASIS) ×3 IMPLANT
KIT BASIN OR (CUSTOM PROCEDURE TRAY) ×3 IMPLANT
KIT TURNOVER KIT B (KITS) ×3 IMPLANT
NEEDLE HYPO 22GX1.5 SAFETY (NEEDLE) ×3 IMPLANT
NEEDLE SPNL 22GX3.5 QUINCKE BK (NEEDLE) ×6 IMPLANT
NS IRRIG 1000ML POUR BTL (IV SOLUTION) ×3 IMPLANT
OIL CARTRIDGE MAESTRO DRILL (MISCELLANEOUS) ×3
PACK LAMINECTOMY NEURO (CUSTOM PROCEDURE TRAY) ×3 IMPLANT
PAD ARMBOARD 7.5X6 YLW CONV (MISCELLANEOUS) ×9 IMPLANT
RUBBERBAND STERILE (MISCELLANEOUS) ×6 IMPLANT
SPONGE SURGIFOAM ABS GEL SZ50 (HEMOSTASIS) IMPLANT
STRIP CLOSURE SKIN 1/2X4 (GAUZE/BANDAGES/DRESSINGS) ×2 IMPLANT
SUT VIC AB 2-0 CT1 18 (SUTURE) ×3 IMPLANT
SUT VIC AB 3-0 SH 8-18 (SUTURE) ×3 IMPLANT
TOWEL GREEN STERILE (TOWEL DISPOSABLE) ×3 IMPLANT
TOWEL GREEN STERILE FF (TOWEL DISPOSABLE) ×3 IMPLANT
WATER STERILE IRR 1000ML POUR (IV SOLUTION) ×3 IMPLANT

## 2018-06-10 NOTE — Anesthesia Procedure Notes (Signed)
Procedure Name: Intubation Date/Time: 06/10/2018 2:45 PM Performed by: Eligha Bridegroom, CRNA Pre-anesthesia Checklist: Patient identified, Emergency Drugs available, Suction available and Patient being monitored Patient Re-evaluated:Patient Re-evaluated prior to induction Oxygen Delivery Method: Circle system utilized Preoxygenation: Pre-oxygenation with 100% oxygen Induction Type: IV induction Ventilation: Mask ventilation without difficulty and Oral airway inserted - appropriate to patient size Laryngoscope Size: Mac and 3 Grade View: Grade I Tube type: Oral Tube size: 7.5 mm Number of attempts: 1 Airway Equipment and Method: Stylet Placement Confirmation: ETT inserted through vocal cords under direct vision,  positive ETCO2 and breath sounds checked- equal and bilateral Secured at: 21 cm Tube secured with: Tape Dental Injury: Teeth and Oropharynx as per pre-operative assessment

## 2018-06-10 NOTE — Brief Op Note (Signed)
06/10/2018  4:57 PM  PATIENT:  [name].  72 y.o. male  PRE-OPERATIVE DIAGNOSIS:  Stenosis  POST-OPERATIVE DIAGNOSIS:  Stenosis  PROCEDURE:  Procedure(s): Microdiscectomy - right - Thoracic six-thoracic seven (Right)  SURGEON:  Surgeon(s) and Role:    * [name], MD - Primary    * Ostergard, Joyice Faster, MD - Assisting  PHYSICIAN ASSISTANT:   ASSISTANTS:    ANESTHESIA:   general  EBL:  100 mL   BLOOD ADMINISTERED:none  DRAINS: none   LOCAL MEDICATIONS USED:  MARCAINE     SPECIMEN:  No Specimen  DISPOSITION OF SPECIMEN:  PATHOLOGY  COUNTS:  YES  TOURNIQUET:  * No tourniquets in log *  DICTATION: .Dragon Dictation  PLAN OF CARE: Admit to inpatient   PATIENT DISPOSITION:  PACU - hemodynamically stable.   Delay start of Pharmacological VTE agent (>24hrs) due to surgical blood loss or risk of bleeding: yes

## 2018-06-10 NOTE — Op Note (Signed)
Date of procedure: 06/10/2018  Date of dictation: Same  Service: Neurosurgery  Preoperative diagnosis: Right T6-T7 herniated nucleus pulposis with myelopathy   Postoperative diagnosis: Same  Procedure Name: Right T6-T7 laminotomy with right T7 transpedicular microdiscectomy  Surgeon:Deshan Hemmelgarn A.Clayborn Milnes, M.D.  Asst. Surgeon: Venetia Constable  Anesthesia: General  Indication: 72 year old male with back and bilateral lower extremity pain paresthesias and weakness.  Work-up demonstrates evidence for large right-sided T6-T7 partially calcified disc herniation with severe spinal stenosis and cord compression.  Patient presents now for thoracic decompression via microdiscectomy in hopes of improving his symptoms.  Operative note: After induction anesthesia, patient position prone onto bolsters and appropriately padded.  Patient's thoracic region was prepped and draped sterilely.  Incision made overlying T6-7.  Retractor placed.  X-ray taken.  Levels confirmed.  Laminotomy then performed using high-speed drill and Kerrison Rogers to remove the inferior two thirds of the lamina of T6 the superior one half of the lamina of T7 the medial one half of the T 6 7 facet joint.  Microscope brought in field using microdissection.  Ligament flavum elevated and resected.  Right-sided T7 pedicle was then resected using high-speed drill and the pedicle was resected into the body of T7.  Microscope was then used for microdissection of the spinal canal.  The lateral aspect of the thecal sac was gradually explored.  The disc space was readily apparent.  This was then incised.  Using micro-hooks a large amount of partially calcified disc herniation was sequentially dissected free and removed using micropituitary's.  The disc herniation was pushed into the cavity created by removing the pedicle into the body of T7.  Doing this technique all elements of the disc herniation appeared to be resected without putting any undue pressure upon  the thecal sac or spinal cord.  At this point a very thorough decompression had been achieved.  The wound was then irrigated fanlike solution.  There was no evidence of injury to thecal sac or nerve roots.  Wounds and closed in layers of Vicryl sutures.  Steri-Strips sterile dressing were applied.  No apparent complications.  Patient tolerated the procedure well and he returns to the recovery room postop.

## 2018-06-10 NOTE — Anesthesia Postprocedure Evaluation (Signed)
Anesthesia Post Note  Patient: Radames Mejorado.  Procedure(s) Performed: Microdiscectomy - right - Thoracic six-thoracic seven (Right Back)     Patient location during evaluation: PACU Anesthesia Type: General Level of consciousness: awake and alert Pain management: pain level controlled Vital Signs Assessment: post-procedure vital signs reviewed and stable Respiratory status: spontaneous breathing, nonlabored ventilation and respiratory function stable Cardiovascular status: blood pressure returned to baseline and stable Postop Assessment: no apparent nausea or vomiting Anesthetic complications: no    Last Vitals:  Vitals:   06/10/18 1715 06/10/18 1730  BP: 133/65 (!) 145/71  Pulse: 75 79  Resp: 17 20  Temp:    SpO2: 97% 92%    Last Pain:  Vitals:   06/10/18 1730  TempSrc:   PainSc: 8                  Kristian Mogg,W. EDMOND
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
## 2018-06-10 NOTE — Transfer of Care (Signed)
Immediate Anesthesia Transfer of Care Note  Patient: [name].  Procedure(s) Performed: Microdiscectomy - right - Thoracic six-thoracic seven (Right Back)  Patient Location: PACU  Anesthesia Type:General  Level of Consciousness: awake and alert   Airway & Oxygen Therapy: Patient Spontanous Breathing and Patient connected to nasal cannula oxygen  Post-op Assessment: Report given to RN and Post -op Vital signs reviewed and stable  Post vital signs: Reviewed and stable  Last Vitals:  Vitals Value Taken Time  BP 116/75 06/10/2018  5:09 PM  Temp 36.3 C 06/10/2018  5:05 PM  Pulse 74 06/10/2018  5:14 PM  Resp 15 06/10/2018  5:14 PM  SpO2 98 % 06/10/2018  5:14 PM  Vitals shown include unvalidated device data.  Last Pain:  Vitals:   06/10/18 1341  TempSrc: Oral  PainSc: 8       Patients Stated Pain Goal: 3 (31/51/76 1607)  Complications: No apparent anesthesia complications

## 2018-06-11 ENCOUNTER — Encounter (HOSPITAL_COMMUNITY): Payer: Self-pay | Admitting: Neurosurgery

## 2018-06-11 LAB — GLUCOSE, CAPILLARY: Glucose-Capillary: 303 mg/dL — ABNORMAL HIGH (ref 70–99)

## 2018-06-11 NOTE — Discharge Summary (Signed)
Physician Discharge Summary  Patient ID: [name]. MRN: [id number] DOB/AGE: [birth year] 72 y.o.  Admit date: 06/10/2018 Discharge date: 06/11/2018  Admission Diagnoses:  Discharge Diagnoses:  Active Problems:   Herniation of intervertebral disc of thoracic spine with myelopathy   Discharged Condition: good  Hospital Course: Patient admitted to the hospital where he underwent uncomplicated transpedicular microdiscectomy at T6-7.  Postop [name] doing very well.  Preoperative back and lower extremity pain much improved.  Standing and walking much better.  Continent of urine.  Patient feels much better and ready for discharge home.  Consults:   Significant Diagnostic Studies:   Treatments:   Discharge Exam: Blood pressure (!) 148/72, pulse 79, temperature 98.6 F (37 C), temperature source Oral, resp. rate 18, height 5' 10.5" (1.791 m), weight 92.4 kg, SpO2 97 %. Awake and alert.  Oriented and appropriate.  Cranial nerve function intact.  Motor and sensory function extremities normal.  Wound clean and dry.  Chest and abdomen benign.  Disposition: Discharge disposition: 01-Home or Self Care        Allergies as of 06/11/2018      Reactions   Gelatin Other (See Comments)   ALPHA-GAL DANGER   Meat [alpha-gal] Other (See Comments)   REACTION TO HOOVED ANIMALS PARTICULARLY RED MEAT   Pork-derived Products Other (See Comments)   ALPHA-GAL DANGER   Shellfish Allergy Shortness Of Breath   Ramipril Swelling   Codeine Nausea And Vomiting   Morphine Itching      Medication List    TAKE these medications   atorvastatin 80 MG tablet Commonly known as:  LIPITOR TAKE 1 TABLET AT BEDTIME   budesonide-formoterol 160-4.5 MCG/ACT inhaler Commonly known as:  SYMBICORT Inhale 2 puffs into the lungs 2 (two) times daily.   clopidogrel 75 MG tablet Commonly known as:  PLAVIX TAKE 1 TABLET BY MOUTH EVERY DAY What changed:  when to take this   doxazosin 4 MG tablet Commonly  known as:  CARDURA TAKE 1 TABLET BY MOUTH EVERY DAY What changed:  when to take this   EPINEPHrine 0.3 mg/0.3 mL Soaj injection Commonly known as:  EPI-PEN Inject 0.3 mg into the muscle as directed.   ezetimibe 10 MG tablet Commonly known as:  ZETIA Take 1 tablet (10 mg total) by mouth daily. What changed:  when to take this   fenofibrate 160 MG tablet TAKE 1 TABLET EVERY DAY What changed:  when to take this   fluticasone 50 MCG/ACT nasal spray Commonly known as:  FLONASE SPRAY 1 SPRAY INTO EACH NOSTRIL 1 TO 2 TIMES DAILY AS NEEDED What changed:    how much to take  how to take this  when to take this  reasons to take this  additional instructions   hydrochlorothiazide 25 MG tablet Commonly known as:  HYDRODIURIL Take 1 tablet (25 mg total) by mouth daily. What changed:  when to take this   levocetirizine 5 MG tablet Commonly known as:  XYZAL Take 1 tablet (5 mg total) by mouth every evening.   losartan 50 MG tablet Commonly known as:  COZAAR Take 1 tablet (50 mg total) by mouth daily. What changed:  when to take this   metFORMIN 850 MG tablet Commonly known as:  GLUCOPHAGE TAKE 1 TABLET BY MOUTH 2 TIMES DAILY WITH A MEAL. What changed:  See the new instructions.   metoprolol succinate 25 MG 24 hr tablet Commonly known as:  TOPROL-XL TAKE 1 TABLET BY MOUTH EVERY DAY What changed:  when  to take this   ofloxacin 0.3 % ophthalmic solution Commonly known as:  OCUFLOX Place 1 drop into the left eye 4 (four) times daily as needed (eye infection).   oxyCODONE-acetaminophen 10-325 MG tablet Commonly known as:  PERCOCET Take 1 tablet by mouth every 4 (four) hours as needed for pain.        Signed: Cooper Render Benino Korinek 06/11/2018, 9:58 AM

## 2018-06-11 NOTE — Discharge Instructions (Signed)

## 2018-06-11 NOTE — Plan of Care (Signed)
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
## 2018-06-11 NOTE — Progress Notes (Signed)
Pt doing well. Pt and family given D/C instructions with verbal understanding. Pt's incision is clean and dry with no sign of infection. Pt's IV was removed prior to D/C. Pt received RW from [place] per MD order. Pt D/C'd home via wheelchair @ 1025 per MD order. Pt is stable @ D/C and has no other needs at this time. Holli Humbles, RN

## 2018-06-11 NOTE — Evaluation (Signed)
Occupational Therapy Evaluation Patient Details Name: [name]. MRN: [id number] DOB: [birth year] Today's Date: 06/11/2018    History of Present Illness Pt is a 72 y/o male s/p right-sided T6-T7 laminotomy with transpedicular microdiscectomy due to worsening pain and radiation to B LEs.  Past medical history: Arthritis, Cancer, Chronic back pain, COPD, CAD, Diabetes mellitus, History of enucleation of left eyeball, Hypertension, and Thoracic disc disease with myelopathy.   Clinical Impression   PTA patient independent and driving.  Admitted for above and limited by back precautions, pain and decreased activity tolerance.  Patient educated on precautions, body mechanics, safety, mobility, ADL compensatory techniques and recommendations.  He requires supervision for self care tasks and transfers at this time for precautions adherence and compensatory techniques, verbalizes understanding with all education provided.  Patient will have intermittent support from family as needed, and at this time no further OT needs have been identified.  Thank you for this referral.  OT signing off.     Follow Up Recommendations  No OT follow up;Supervision - Intermittent    Equipment Recommendations  None recommended by OT(has access to [facility] )    Recommendations for Other Services PT consult     Precautions / Restrictions Precautions Precautions: Back Precaution Booklet Issued: Yes (comment) Precaution Comments: reviewed precautions with pt Required Braces or Orthoses: (no brace needed order) Restrictions Weight Bearing Restrictions: No      Mobility Bed Mobility Overal bed mobility: Needs Assistance Bed Mobility: Rolling;Sidelying to Sit;Sit to Sidelying Rolling: Supervision Sidelying to sit: Supervision     Sit to sidelying: Supervision General bed mobility comments: supervision for log roll technique  Transfers Overall transfer level: Needs assistance Equipment used: None Transfers:  Sit to/from Stand Sit to Stand: Supervision         General transfer comment: supervision for safety and body mechanics     Balance Overall balance assessment: Mild deficits observed, not formally tested                                         ADL either performed or assessed with clinical judgement   ADL Overall ADL's : Needs assistance/impaired     Grooming: Supervision/safety;Standing Grooming Details (indicate cue type and reason): reviewed compenstaory techniques for back precautions Upper Body Bathing: Supervision/ safety;Sitting   Lower Body Bathing: Supervison/ safety;Cueing for back precautions;Sit to/from stand;With adaptive equipment Lower Body Bathing Details (indicate cue type and reason): reviewed seated for safety and use of long sponge Upper Body Dressing : Supervision/safety;Sitting   Lower Body Dressing: Supervision/safety;Sit to/from stand;Cueing for back precautions;Cueing for compensatory techniques;With adaptive equipment Lower Body Dressing Details (indicate cue type and reason): reviewed safety compelting seated, patient reports plan to wear loose pants with slip on shoes; reviewed compensatory techniques and AE (reacher) use if needed Toilet Transfer: Supervision/safety;Ambulation Toilet Transfer Details (indicate cue type and reason): simulated in room Toileting- Clothing Manipulation and Hygiene: Supervision/safety;Sit to/from stand;Cueing for compensatory techniques   Tub/ Shower Transfer: Walk-in shower;Ambulation;Shower seat;Supervision/safety   Functional mobility during ADLs: Supervision/safety General ADL Comments: overall supervision for self care tasks, reviewed compensatory techniques and recommendations for back precautions, as well as body mechanics      Vision Baseline Vision/History: Wears glasses Wears Glasses: At all times Patient Visual Report: No change from baseline Vision Assessment?: No apparent visual  deficits(from baseline )     Perception     Praxis  Pertinent Vitals/Pain Pain Assessment: Faces Faces Pain Scale: Hurts a little bit Pain Location: back incision Pain Descriptors / Indicators: Discomfort;Operative site guarding Pain Intervention(s): Limited activity within patient's tolerance;Repositioned     Hand Dominance     Extremity/Trunk Assessment Upper Extremity Assessment Upper Extremity Assessment: Overall WFL for tasks assessed(hx rotator cuff tears per pt, but functiona)   Lower Extremity Assessment Lower Extremity Assessment: Defer to PT evaluation   Cervical / Trunk Assessment Cervical / Trunk Assessment: Other exceptions Cervical / Trunk Exceptions: s/p surgery   Communication Communication Communication: HOH   Cognition Arousal/Alertness: Awake/alert Behavior During Therapy: WFL for tasks assessed/performed Overall Cognitive Status: Within Functional Limits for tasks assessed                                     General Comments  discussed IADLs (takes care of big dogs) and has plan in place to adhere to precautions     Exercises     Shoulder Instructions      Home Living Family/patient expects to be discharged to:: Private residence Living Arrangements: Alone Available Help at Discharge: Family;Available PRN/intermittently Type of Home: Mobile home Home Access: Ramped entrance     Home Layout: One level     Bathroom Shower/Tub: Tub/shower unit;Walk-in shower   Bathroom Toilet: Handicapped height     Home Equipment: [place] - single point;Shower seat;Grab bars - toilet;Grab bars - tub/shower;Adaptive equipment Adaptive Equipment: Reacher;Long-handled sponge Additional Comments: reports has access to shower chair       Prior Functioning/Environment Level of Independence: Independent with assistive device(s)        Comments: reports intermittently using cane for mobility, otherwise indepedent and driving taking care of  3 large dogs        OT Problem List: Decreased activity tolerance;Decreased safety awareness;Decreased knowledge of use of DME or AE;Decreased knowledge of precautions;Pain      OT Treatment/Interventions:      OT Goals(Current goals can be found in the care plan section) Acute Rehab OT Goals Patient Stated Goal: to get home today OT Goal Formulation: With patient  OT Frequency:     Barriers to D/C:            Co-evaluation              AM-PAC OT "6 Clicks" Daily Activity     Outcome Measure Help from another person eating meals?: None Help from another person taking care of personal grooming?: None Help from another person toileting, which includes using toliet, bedpan, or urinal?: None Help from another person bathing (including washing, rinsing, drying)?: None Help from another person to put on and taking off regular upper body clothing?: None Help from another person to put on and taking off regular lower body clothing?: None 6 Click Score: 24   End of Session Nurse Communication: Mobility status  Activity Tolerance: Patient tolerated treatment well Patient left: with call bell/phone within reach;Other (comment)(seated EOB)  OT Visit Diagnosis: Other abnormalities of gait and mobility (R26.89);Pain Pain - part of body: (back incision)                Time: 3329-5188 OT Time Calculation (min): 22 min Charges:  OT General Charges $OT Visit: 1 Visit OT Evaluation $OT Eval Low Complexity: 1 Low  Delight Stare, OT Acute Rehabilitation Services Pager [phone] Office [phone]   Delight Stare 06/11/2018, 8:18 AM

## 2018-06-13 MED FILL — Thrombin (Recombinant) For Soln 5000 Unit: CUTANEOUS | Qty: 5000 | Status: AC

## 2018-06-20 ENCOUNTER — Other Ambulatory Visit: Payer: Self-pay | Admitting: Family Medicine

## 2018-06-20 MED ORDER — OXYCODONE-ACETAMINOPHEN 10-325 MG PO TABS: 1.0000 | ORAL_TABLET | ORAL | 0 refills | Status: DC | PRN

## 2018-06-20 NOTE — Telephone Encounter (Signed)
Patient requesting a refill on Oxycodone     LOV: 03/25/18  LRF:   05/16/18

## 2018-07-01 ENCOUNTER — Other Ambulatory Visit: Payer: Self-pay | Admitting: Family Medicine

## 2018-07-19 ENCOUNTER — Ambulatory Visit (INDEPENDENT_AMBULATORY_CARE_PROVIDER_SITE_OTHER): Payer: Medicare HMO | Admitting: Family Medicine

## 2018-07-19 ENCOUNTER — Encounter: Payer: Self-pay | Admitting: Family Medicine

## 2018-07-19 VITALS — BP 150/84 | HR 76 | Temp 97.6°F | Resp 18 | Ht 70.0 in | Wt 204.0 lb

## 2018-07-19 DIAGNOSIS — I739 Peripheral vascular disease, unspecified: Secondary | ICD-10-CM

## 2018-07-19 DIAGNOSIS — E1169 Type 2 diabetes mellitus with other specified complication: Secondary | ICD-10-CM | POA: Diagnosis not present

## 2018-07-19 DIAGNOSIS — I2510 Atherosclerotic heart disease of native coronary artery without angina pectoris: Secondary | ICD-10-CM | POA: Diagnosis not present

## 2018-07-19 DIAGNOSIS — I10 Essential (primary) hypertension: Secondary | ICD-10-CM

## 2018-07-19 DIAGNOSIS — M48062 Spinal stenosis, lumbar region with neurogenic claudication: Secondary | ICD-10-CM

## 2018-07-19 DIAGNOSIS — R5382 Chronic fatigue, unspecified: Secondary | ICD-10-CM | POA: Diagnosis not present

## 2018-07-19 DIAGNOSIS — G9519 Other vascular myelopathies: Secondary | ICD-10-CM

## 2018-07-19 DIAGNOSIS — Z13220 Encounter for screening for lipoid disorders: Secondary | ICD-10-CM | POA: Diagnosis not present

## 2018-07-19 DIAGNOSIS — G4710 Hypersomnia, unspecified: Secondary | ICD-10-CM

## 2018-07-19 DIAGNOSIS — R6889 Other general symptoms and signs: Secondary | ICD-10-CM | POA: Diagnosis not present
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
## 2018-07-20 ENCOUNTER — Other Ambulatory Visit: Payer: Self-pay

## 2018-07-20 ENCOUNTER — Ambulatory Visit (HOSPITAL_COMMUNITY): Payer: Medicare HMO | Attending: Cardiology

## 2018-07-20 DIAGNOSIS — E1169 Type 2 diabetes mellitus with other specified complication: Secondary | ICD-10-CM | POA: Insufficient documentation

## 2018-07-20 DIAGNOSIS — I2510 Atherosclerotic heart disease of native coronary artery without angina pectoris: Secondary | ICD-10-CM | POA: Insufficient documentation

## 2018-07-20 DIAGNOSIS — I10 Essential (primary) hypertension: Secondary | ICD-10-CM | POA: Insufficient documentation

## 2018-07-20 DIAGNOSIS — I739 Peripheral vascular disease, unspecified: Secondary | ICD-10-CM | POA: Insufficient documentation

## 2018-07-20 DIAGNOSIS — R5382 Chronic fatigue, unspecified: Secondary | ICD-10-CM | POA: Insufficient documentation

## 2018-07-20 LAB — CBC WITH DIFFERENTIAL/PLATELET
Absolute Monocytes: 449 cells/uL (ref 200–950)
Basophils Absolute: 9 cells/uL (ref 0–200)
Basophils Relative: 0.2 %
Eosinophils Absolute: 40 cells/uL (ref 15–500)
Eosinophils Relative: 0.9 %
HCT: 36.3 % — ABNORMAL LOW (ref 38.5–50.0)
Hemoglobin: 12.3 g/dL — ABNORMAL LOW (ref 13.2–17.1)
Lymphs Abs: 1487 cells/uL (ref 850–3900)
MCH: 30 pg (ref 27.0–33.0)
MCHC: 33.9 g/dL (ref 32.0–36.0)
MCV: 88.5 fL (ref 80.0–100.0)
MONOS PCT: 10.2 %
MPV: 10.1 fL (ref 7.5–12.5)
Neutro Abs: 2416 cells/uL (ref 1500–7800)
Neutrophils Relative %: 54.9 %
Platelets: 121 10*3/uL — ABNORMAL LOW (ref 140–400)
RBC: 4.1 10*6/uL — AB (ref 4.20–5.80)
RDW: 13.9 % (ref 11.0–15.0)
Total Lymphocyte: 33.8 %
WBC: 4.4 10*3/uL (ref 3.8–10.8)

## 2018-07-20 LAB — LIPID PANEL
CHOLESTEROL: 79 mg/dL (ref <200)
HDL: 25 mg/dL — ABNORMAL LOW (ref >40)
LDL Cholesterol (Calc): 31 mg/dL (calc)
Non-HDL Cholesterol (Calc): 54 mg/dL (calc) (ref <130)
Total CHOL/HDL Ratio: 3.2 (calc) (ref <5.0)
Triglycerides: 157 mg/dL — ABNORMAL HIGH (ref <150)

## 2018-07-20 LAB — COMPLETE METABOLIC PANEL WITH GFR
AG Ratio: 1.1 (calc) (ref 1.0–2.5)
ALT: 28 U/L (ref 9–46)
AST: 39 U/L — ABNORMAL HIGH (ref 10–35)
Albumin: 3.7 g/dL (ref 3.6–5.1)
Alkaline phosphatase (APISO): 52 U/L (ref 40–115)
BUN: 14 mg/dL (ref 7–25)
CO2: 26 mmol/L (ref 20–32)
Calcium: 9.1 mg/dL (ref 8.6–10.3)
Chloride: 104 mmol/L (ref 98–110)
Creat: 1.01 mg/dL (ref 0.70–1.18)
GFR, EST AFRICAN AMERICAN: 85 mL/min/{1.73_m2} (ref >=60)
GFR, EST NON AFRICAN AMERICAN: 73 mL/min/{1.73_m2} (ref >=60)
Globulin: 3.4 g/dL (calc) (ref 1.9–3.7)
Glucose, Bld: 114 mg/dL — ABNORMAL HIGH (ref 65–99)
Potassium: 4.2 mmol/L (ref 3.5–5.3)
Sodium: 138 mmol/L (ref 135–146)
TOTAL PROTEIN: 7.1 g/dL (ref 6.1–8.1)
Total Bilirubin: 0.8 mg/dL (ref 0.2–1.2)

## 2018-07-20 LAB — TESTOSTERONE: Testosterone: 424 ng/dL (ref 250–827)

## 2018-07-20 LAB — VITAMIN B12: Vitamin B-12: 451 pg/mL (ref 200–1100)

## 2018-07-20 LAB — TSH: TSH: 3.04 mIU/L (ref 0.40–4.50)

## 2018-07-21 ENCOUNTER — Other Ambulatory Visit: Payer: Self-pay | Admitting: Family Medicine

## 2018-07-21 MED ORDER — OXYCODONE-ACETAMINOPHEN 10-325 MG PO TABS: 1.0000 | ORAL_TABLET | ORAL | 0 refills | Status: DC | PRN

## 2018-07-21 NOTE — Telephone Encounter (Signed)
Patient requesting a refill on Oxycodone     LOV: 07/19/2018  LRF: 06/20/18

## 2018-08-10 ENCOUNTER — Other Ambulatory Visit: Payer: Self-pay | Admitting: Internal Medicine

## 2018-08-10 ENCOUNTER — Other Ambulatory Visit: Payer: Self-pay | Admitting: Allergy and Immunology

## 2018-08-10 DIAGNOSIS — T7840XD Allergy, unspecified, subsequent encounter: Secondary | ICD-10-CM

## 2018-08-10 DIAGNOSIS — L500 Allergic urticaria: Secondary | ICD-10-CM

## 2018-08-26 ENCOUNTER — Other Ambulatory Visit: Payer: Self-pay | Admitting: *Deleted

## 2018-08-26 MED ORDER — METFORMIN HCL 850 MG PO TABS: 850.0000 mg | ORAL_TABLET | Freq: Two times a day (BID) | ORAL | 2 refills | Status: DC

## 2018-08-26 MED ORDER — DOXAZOSIN MESYLATE 4 MG PO TABS: 4.0000 mg | ORAL_TABLET | Freq: Every day | ORAL | 3 refills | Status: DC

## 2018-08-29 ENCOUNTER — Institutional Professional Consult (permissible substitution): Payer: Medicare HMO | Admitting: Neurology

## 2018-08-29 ENCOUNTER — Other Ambulatory Visit: Payer: Self-pay | Admitting: Family Medicine

## 2018-08-29 MED ORDER — OXYCODONE-ACETAMINOPHEN 10-325 MG PO TABS: 1.0000 | ORAL_TABLET | ORAL | 0 refills | Status: DC | PRN

## 2018-08-29 NOTE — Telephone Encounter (Signed)
Patient requesting a refill on Oxycodone     LOV: 07/19/2018  LRF:      07/21/18

## 2018-09-05 ENCOUNTER — Ambulatory Visit (INDEPENDENT_AMBULATORY_CARE_PROVIDER_SITE_OTHER): Payer: Medicare HMO | Admitting: Neurology

## 2018-09-05 ENCOUNTER — Encounter: Payer: Self-pay | Admitting: Neurology

## 2018-09-05 ENCOUNTER — Other Ambulatory Visit: Payer: Self-pay

## 2018-09-05 VITALS — BP 159/79 | HR 79 | Resp 18 | Ht 70.0 in | Wt 198.0 lb

## 2018-09-05 DIAGNOSIS — I739 Peripheral vascular disease, unspecified: Secondary | ICD-10-CM

## 2018-09-05 DIAGNOSIS — F17200 Nicotine dependence, unspecified, uncomplicated: Secondary | ICD-10-CM | POA: Diagnosis not present

## 2018-09-05 DIAGNOSIS — I6521 Occlusion and stenosis of right carotid artery: Secondary | ICD-10-CM | POA: Diagnosis not present

## 2018-09-05 DIAGNOSIS — G4714 Hypersomnia due to medical condition: Secondary | ICD-10-CM

## 2018-09-05 DIAGNOSIS — M5104 Intervertebral disc disorders with myelopathy, thoracic region: Secondary | ICD-10-CM

## 2018-09-05 DIAGNOSIS — J431 Panlobular emphysema: Secondary | ICD-10-CM

## 2018-09-05 DIAGNOSIS — I25119 Atherosclerotic heart disease of native coronary artery with unspecified angina pectoris: Secondary | ICD-10-CM | POA: Diagnosis not present

## 2018-09-05 DIAGNOSIS — J418 Mixed simple and mucopurulent chronic bronchitis: Secondary | ICD-10-CM

## 2018-09-05 DIAGNOSIS — F329 Major depressive disorder, single episode, unspecified: Secondary | ICD-10-CM

## 2018-09-05 DIAGNOSIS — Z8709 Personal history of other diseases of the respiratory system: Secondary | ICD-10-CM | POA: Diagnosis not present
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
## 2018-09-23 ENCOUNTER — Other Ambulatory Visit: Payer: Self-pay

## 2018-09-23 ENCOUNTER — Telehealth: Payer: Self-pay | Admitting: Family Medicine

## 2018-09-23 ENCOUNTER — Ambulatory Visit (INDEPENDENT_AMBULATORY_CARE_PROVIDER_SITE_OTHER): Payer: Medicare HMO | Admitting: Family Medicine

## 2018-09-23 ENCOUNTER — Encounter: Payer: Self-pay | Admitting: Family Medicine

## 2018-09-23 VITALS — BP 150/80 | HR 102 | Temp 98.0°F | Resp 16 | Ht 70.0 in | Wt 192.0 lb

## 2018-09-23 DIAGNOSIS — R1013 Epigastric pain: Secondary | ICD-10-CM

## 2018-09-23 DIAGNOSIS — I700 Atherosclerosis of aorta: Secondary | ICD-10-CM | POA: Diagnosis not present

## 2018-09-23 DIAGNOSIS — K551 Chronic vascular disorders of intestine: Secondary | ICD-10-CM

## 2018-09-23 MED ORDER — ACCU-CHEK AVIVA PLUS W/DEVICE KIT: PACK | 1 refills | Status: DC

## 2018-09-23 MED ORDER — GLUCOSE BLOOD VI STRP: ORAL_STRIP | 5 refills | Status: DC
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
## 2018-09-23 NOTE — Telephone Encounter (Signed)
Pts daughter wants [place] to call in a blood sugar meter to [facility] since he is stopping metformin, just so they can keep tabs on what the levels are.

## 2018-09-23 NOTE — Telephone Encounter (Signed)
Meter sent to pharm

## 2018-09-24 LAB — COMPLETE METABOLIC PANEL WITH GFR
AG Ratio: 0.6 (calc) — ABNORMAL LOW (ref 1.0–2.5)
ALKALINE PHOSPHATASE (APISO): 39 U/L (ref 35–144)
ALT: 18 U/L (ref 9–46)
AST: 41 U/L — AB (ref 10–35)
Albumin: 2.5 g/dL — ABNORMAL LOW (ref 3.6–5.1)
BUN/Creatinine Ratio: 12 (calc) (ref 6–22)
BUN: 58 mg/dL — ABNORMAL HIGH (ref 7–25)
CO2: 22 mmol/L (ref 20–32)
Calcium: 8.1 mg/dL — ABNORMAL LOW (ref 8.6–10.3)
Chloride: 105 mmol/L (ref 98–110)
Creat: 4.67 mg/dL — ABNORMAL HIGH (ref 0.70–1.18)
GFR, EST NON AFRICAN AMERICAN: 11 mL/min/{1.73_m2} — AB (ref >=60)
GFR, Est African American: 13 mL/min/{1.73_m2} — ABNORMAL LOW (ref >=60)
Globulin: 4.3 g/dL (calc) — ABNORMAL HIGH (ref 1.9–3.7)
Glucose, Bld: 143 mg/dL — ABNORMAL HIGH (ref 65–99)
Potassium: 4.1 mmol/L (ref 3.5–5.3)
Sodium: 135 mmol/L (ref 135–146)
Total Bilirubin: 0.6 mg/dL (ref 0.2–1.2)
Total Protein: 6.8 g/dL (ref 6.1–8.1)

## 2018-09-24 LAB — CBC WITH DIFFERENTIAL/PLATELET
Absolute Monocytes: 403 cells/uL (ref 200–950)
BASOS ABS: 10 {cells}/uL (ref 0–200)
Basophils Relative: 0.2 %
Eosinophils Absolute: 41 cells/uL (ref 15–500)
Eosinophils Relative: 0.8 %
HCT: 30.2 % — ABNORMAL LOW (ref 38.5–50.0)
Hemoglobin: 9.8 g/dL — ABNORMAL LOW (ref 13.2–17.1)
Lymphs Abs: 1163 cells/uL (ref 850–3900)
MCH: 28.1 pg (ref 27.0–33.0)
MCHC: 32.5 g/dL (ref 32.0–36.0)
MCV: 86.5 fL (ref 80.0–100.0)
MONOS PCT: 7.9 %
MPV: 9.8 fL (ref 7.5–12.5)
Neutro Abs: 3483 cells/uL (ref 1500–7800)
Neutrophils Relative %: 68.3 %
Platelets: 153 10*3/uL (ref 140–400)
RBC: 3.49 10*6/uL — ABNORMAL LOW (ref 4.20–5.80)
RDW: 14.3 % (ref 11.0–15.0)
Total Lymphocyte: 22.8 %
WBC: 5.1 10*3/uL (ref 3.8–10.8)

## 2018-09-24 LAB — LIPASE: Lipase: 43 U/L (ref 7–60)

## 2018-09-26 ENCOUNTER — Ambulatory Visit (INDEPENDENT_AMBULATORY_CARE_PROVIDER_SITE_OTHER): Payer: Medicare HMO | Admitting: Family Medicine

## 2018-09-26 ENCOUNTER — Other Ambulatory Visit: Payer: Self-pay

## 2018-09-26 VITALS — BP 146/70 | HR 88 | Temp 97.7°F | Resp 16 | Ht 70.0 in | Wt 196.0 lb

## 2018-09-26 DIAGNOSIS — N179 Acute kidney failure, unspecified: Secondary | ICD-10-CM

## 2018-09-26 LAB — URINALYSIS, ROUTINE W REFLEX MICROSCOPIC
Bilirubin Urine: NEGATIVE
GLUCOSE, UA: NEGATIVE
Hyaline Cast: NONE SEEN /LPF
Ketones, ur: NEGATIVE
Leukocytes,Ua: NEGATIVE
Nitrite: NEGATIVE
Specific Gravity, Urine: 1.014 (ref 1.001–1.03)
pH: 5.5 (ref 5.0–8.0)

## 2018-09-26 LAB — MICROSCOPIC MESSAGE
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
## 2018-09-27 ENCOUNTER — Inpatient Hospital Stay (HOSPITAL_COMMUNITY)
Admission: EM | Admit: 2018-09-27 | Discharge: 2018-10-20 | DRG: 673 | Disposition: A | Discharge status: Home / Self Care | Payer: Medicare HMO | Attending: Internal Medicine | Admitting: Internal Medicine

## 2018-09-27 ENCOUNTER — Other Ambulatory Visit: Payer: Medicare HMO

## 2018-09-27 ENCOUNTER — Other Ambulatory Visit: Payer: Self-pay

## 2018-09-27 ENCOUNTER — Encounter (HOSPITAL_COMMUNITY): Payer: Self-pay

## 2018-09-27 ENCOUNTER — Ambulatory Visit
Admission: RE | Admit: 2018-09-27 | Discharge: 2018-09-27 | Disposition: A | Discharge status: Home / Self Care | Payer: Medicare HMO | Source: Ambulatory Visit | Attending: Family Medicine | Admitting: Family Medicine

## 2018-09-27 ENCOUNTER — Ambulatory Visit (INDEPENDENT_AMBULATORY_CARE_PROVIDER_SITE_OTHER): Payer: Medicare HMO | Admitting: Family Medicine

## 2018-09-27 ENCOUNTER — Other Ambulatory Visit: Payer: Self-pay | Admitting: Family Medicine

## 2018-09-27 DIAGNOSIS — N179 Acute kidney failure, unspecified: Secondary | ICD-10-CM

## 2018-09-27 DIAGNOSIS — H9190 Unspecified hearing loss, unspecified ear: Secondary | ICD-10-CM | POA: Diagnosis present

## 2018-09-27 DIAGNOSIS — Z91018 Allergy to other foods: Secondary | ICD-10-CM

## 2018-09-27 DIAGNOSIS — Z86010 Personal history of colonic polyps: Secondary | ICD-10-CM

## 2018-09-27 DIAGNOSIS — E872 Acidosis: Secondary | ICD-10-CM | POA: Diagnosis not present

## 2018-09-27 DIAGNOSIS — N320 Bladder-neck obstruction: Secondary | ICD-10-CM | POA: Diagnosis present

## 2018-09-27 DIAGNOSIS — E1129 Type 2 diabetes mellitus with other diabetic kidney complication: Secondary | ICD-10-CM

## 2018-09-27 DIAGNOSIS — E8770 Fluid overload, unspecified: Secondary | ICD-10-CM | POA: Diagnosis present

## 2018-09-27 DIAGNOSIS — I129 Hypertensive chronic kidney disease with stage 1 through stage 4 chronic kidney disease, or unspecified chronic kidney disease: Secondary | ICD-10-CM | POA: Diagnosis not present

## 2018-09-27 DIAGNOSIS — Z794 Long term (current) use of insulin: Secondary | ICD-10-CM

## 2018-09-27 DIAGNOSIS — R918 Other nonspecific abnormal finding of lung field: Secondary | ICD-10-CM | POA: Diagnosis not present

## 2018-09-27 DIAGNOSIS — R791 Abnormal coagulation profile: Secondary | ICD-10-CM | POA: Diagnosis present

## 2018-09-27 DIAGNOSIS — Z8551 Personal history of malignant neoplasm of bladder: Secondary | ICD-10-CM

## 2018-09-27 DIAGNOSIS — Z888 Allergy status to other drugs, medicaments and biological substances status: Secondary | ICD-10-CM

## 2018-09-27 DIAGNOSIS — R001 Bradycardia, unspecified: Secondary | ICD-10-CM | POA: Diagnosis present

## 2018-09-27 DIAGNOSIS — Z833 Family history of diabetes mellitus: Secondary | ICD-10-CM

## 2018-09-27 DIAGNOSIS — C679 Malignant neoplasm of bladder, unspecified: Secondary | ICD-10-CM | POA: Diagnosis present

## 2018-09-27 DIAGNOSIS — J9601 Acute respiratory failure with hypoxia: Secondary | ICD-10-CM | POA: Diagnosis not present

## 2018-09-27 DIAGNOSIS — Z885 Allergy status to narcotic agent status: Secondary | ICD-10-CM

## 2018-09-27 DIAGNOSIS — J449 Chronic obstructive pulmonary disease, unspecified: Secondary | ICD-10-CM | POA: Diagnosis present

## 2018-09-27 DIAGNOSIS — D509 Iron deficiency anemia, unspecified: Secondary | ICD-10-CM | POA: Diagnosis present

## 2018-09-27 DIAGNOSIS — R0989 Other specified symptoms and signs involving the circulatory and respiratory systems: Secondary | ICD-10-CM | POA: Diagnosis present

## 2018-09-27 DIAGNOSIS — I10 Essential (primary) hypertension: Secondary | ICD-10-CM

## 2018-09-27 DIAGNOSIS — E785 Hyperlipidemia, unspecified: Secondary | ICD-10-CM | POA: Diagnosis present

## 2018-09-27 DIAGNOSIS — F17210 Nicotine dependence, cigarettes, uncomplicated: Secondary | ICD-10-CM | POA: Diagnosis present

## 2018-09-27 DIAGNOSIS — D631 Anemia in chronic kidney disease: Secondary | ICD-10-CM | POA: Diagnosis present

## 2018-09-27 DIAGNOSIS — Z79899 Other long term (current) drug therapy: Secondary | ICD-10-CM

## 2018-09-27 DIAGNOSIS — N186 End stage renal disease: Secondary | ICD-10-CM | POA: Diagnosis present

## 2018-09-27 DIAGNOSIS — M549 Dorsalgia, unspecified: Secondary | ICD-10-CM | POA: Diagnosis present

## 2018-09-27 DIAGNOSIS — Z91013 Allergy to seafood: Secondary | ICD-10-CM

## 2018-09-27 DIAGNOSIS — E119 Type 2 diabetes mellitus without complications: Secondary | ICD-10-CM

## 2018-09-27 DIAGNOSIS — N19 Unspecified kidney failure: Secondary | ICD-10-CM

## 2018-09-27 DIAGNOSIS — R34 Anuria and oliguria: Secondary | ICD-10-CM | POA: Diagnosis not present

## 2018-09-27 DIAGNOSIS — Z4901 Encounter for fitting and adjustment of extracorporeal dialysis catheter: Secondary | ICD-10-CM | POA: Diagnosis not present

## 2018-09-27 DIAGNOSIS — R768 Other specified abnormal immunological findings in serum: Secondary | ICD-10-CM | POA: Diagnosis present

## 2018-09-27 DIAGNOSIS — N178 Other acute kidney failure: Secondary | ICD-10-CM | POA: Diagnosis not present

## 2018-09-27 DIAGNOSIS — E1169 Type 2 diabetes mellitus with other specified complication: Secondary | ICD-10-CM | POA: Diagnosis not present

## 2018-09-27 DIAGNOSIS — R197 Diarrhea, unspecified: Secondary | ICD-10-CM | POA: Diagnosis present

## 2018-09-27 DIAGNOSIS — E860 Dehydration: Secondary | ICD-10-CM | POA: Diagnosis present

## 2018-09-27 DIAGNOSIS — R011 Cardiac murmur, unspecified: Secondary | ICD-10-CM | POA: Diagnosis present

## 2018-09-27 DIAGNOSIS — E1151 Type 2 diabetes mellitus with diabetic peripheral angiopathy without gangrene: Secondary | ICD-10-CM | POA: Diagnosis present

## 2018-09-27 DIAGNOSIS — Z7902 Long term (current) use of antithrombotics/antiplatelets: Secondary | ICD-10-CM

## 2018-09-27 DIAGNOSIS — E1122 Type 2 diabetes mellitus with diabetic chronic kidney disease: Secondary | ICD-10-CM | POA: Diagnosis present

## 2018-09-27 DIAGNOSIS — N009 Acute nephritic syndrome with unspecified morphologic changes: Secondary | ICD-10-CM | POA: Diagnosis not present

## 2018-09-27 DIAGNOSIS — G8929 Other chronic pain: Secondary | ICD-10-CM | POA: Diagnosis present

## 2018-09-27 DIAGNOSIS — E11649 Type 2 diabetes mellitus with hypoglycemia without coma: Secondary | ICD-10-CM | POA: Diagnosis not present

## 2018-09-27 DIAGNOSIS — Z955 Presence of coronary angioplasty implant and graft: Secondary | ICD-10-CM

## 2018-09-27 DIAGNOSIS — Z7984 Long term (current) use of oral hypoglycemic drugs: Secondary | ICD-10-CM

## 2018-09-27 DIAGNOSIS — M1990 Unspecified osteoarthritis, unspecified site: Secondary | ICD-10-CM | POA: Diagnosis present

## 2018-09-27 DIAGNOSIS — I2510 Atherosclerotic heart disease of native coronary artery without angina pectoris: Secondary | ICD-10-CM

## 2018-09-27 DIAGNOSIS — Z992 Dependence on renal dialysis: Secondary | ICD-10-CM

## 2018-09-27 DIAGNOSIS — N170 Acute kidney failure with tubular necrosis: Principal | ICD-10-CM | POA: Diagnosis present

## 2018-09-27 DIAGNOSIS — R109 Unspecified abdominal pain: Secondary | ICD-10-CM | POA: Diagnosis present

## 2018-09-27 DIAGNOSIS — N183 Chronic kidney disease, stage 3 (moderate): Secondary | ICD-10-CM | POA: Diagnosis not present

## 2018-09-27 DIAGNOSIS — I120 Hypertensive chronic kidney disease with stage 5 chronic kidney disease or end stage renal disease: Secondary | ICD-10-CM | POA: Diagnosis not present

## 2018-09-27 DIAGNOSIS — Z7951 Long term (current) use of inhaled steroids: Secondary | ICD-10-CM

## 2018-09-27 DIAGNOSIS — Z8249 Family history of ischemic heart disease and other diseases of the circulatory system: Secondary | ICD-10-CM

## 2018-09-27 LAB — BASIC METABOLIC PANEL
ANION GAP: 9 (ref 5–15)
BUN/Creatinine Ratio: 12 (calc) (ref 6–22)
BUN: 67 mg/dL — ABNORMAL HIGH (ref 7–25)
BUN: 68 mg/dL — ABNORMAL HIGH (ref 8–23)
CO2: 22 mmol/L (ref 20–32)
CO2: 21 mmol/L — ABNORMAL LOW (ref 22–32)
CREATININE: 5.47 mg/dL — AB (ref 0.70–1.18)
Calcium: 7.5 mg/dL — ABNORMAL LOW (ref 8.6–10.3)
Calcium: 7.8 mg/dL — ABNORMAL LOW (ref 8.9–10.3)
Chloride: 106 mmol/L (ref 98–110)
Chloride: 105 mmol/L (ref 98–111)
Creatinine, Ser: 5.76 mg/dL — ABNORMAL HIGH (ref 0.61–1.24)
GFR calc Af Amer: 10 mL/min — ABNORMAL LOW (ref >60)
GFR calc non Af Amer: 9 mL/min — ABNORMAL LOW (ref >60)
Glucose, Bld: 115 mg/dL — ABNORMAL HIGH (ref 65–99)
Glucose, Bld: 111 mg/dL — ABNORMAL HIGH (ref 70–99)
Potassium: 4.1 mmol/L (ref 3.5–5.3)
Potassium: 3.9 mmol/L (ref 3.5–5.1)
Sodium: 136 mmol/L (ref 135–146)
Sodium: 135 mmol/L (ref 135–145)

## 2018-09-27 LAB — URINALYSIS, ROUTINE W REFLEX MICROSCOPIC
Bilirubin Urine: NEGATIVE
GLUCOSE, UA: NEGATIVE mg/dL
Ketones, ur: NEGATIVE mg/dL
LEUKOCYTE UA: NEGATIVE
Nitrite: NEGATIVE
Protein, ur: 100 mg/dL — AB
RBC / HPF: >50 RBC/hpf — ABNORMAL HIGH (ref 0–5)
Specific Gravity, Urine: 1.012 (ref 1.005–1.030)
pH: 5 (ref 5.0–8.0)

## 2018-09-27 LAB — BASIC METABOLIC PANEL WITH GFR
BUN / CREAT RATIO: 12 (calc) (ref 6–22)
BUN: 70 mg/dL — ABNORMAL HIGH (ref 7–25)
CO2: 15 mmol/L — ABNORMAL LOW (ref 20–32)
Calcium: 7.9 mg/dL — ABNORMAL LOW (ref 8.6–10.3)
Chloride: 107 mmol/L (ref 98–110)
Creat: 6.02 mg/dL — ABNORMAL HIGH (ref 0.70–1.18)
GFR, EST AFRICAN AMERICAN: 10 mL/min/{1.73_m2} — AB (ref >=60)
GFR, Est Non African American: 8 mL/min/{1.73_m2} — ABNORMAL LOW (ref >=60)
Glucose, Bld: 142 mg/dL — ABNORMAL HIGH (ref 65–99)
Potassium: 4.2 mmol/L (ref 3.5–5.3)
Sodium: 138 mmol/L (ref 135–146)

## 2018-09-27 LAB — CBC WITH DIFFERENTIAL/PLATELET
Abs Immature Granulocytes: 0.01 10*3/uL (ref 0.00–0.07)
Basophils Absolute: 0 10*3/uL (ref 0.0–0.1)
Basophils Relative: 0 %
Eosinophils Absolute: 0.1 10*3/uL (ref 0.0–0.5)
Eosinophils Relative: 3 %
HCT: 29.7 % — ABNORMAL LOW (ref 39.0–52.0)
HEMOGLOBIN: 9.5 g/dL — AB (ref 13.0–17.0)
Immature Granulocytes: 0 %
Lymphocytes Relative: 28 %
Lymphs Abs: 1.3 10*3/uL (ref 0.7–4.0)
MCH: 28.2 pg (ref 26.0–34.0)
MCHC: 32 g/dL (ref 30.0–36.0)
MCV: 88.1 fL (ref 80.0–100.0)
Monocytes Absolute: 0.4 10*3/uL (ref 0.1–1.0)
Monocytes Relative: 8 %
NEUTROS PCT: 61 %
Neutro Abs: 2.8 10*3/uL (ref 1.7–7.7)
Platelets: 137 10*3/uL — ABNORMAL LOW (ref 150–400)
RBC: 3.37 MIL/uL — ABNORMAL LOW (ref 4.22–5.81)
RDW: 16.5 % — ABNORMAL HIGH (ref 11.5–15.5)
WBC: 4.7 10*3/uL (ref 4.0–10.5)
nRBC: 0 % (ref 0.0–0.2)

## 2018-09-27 LAB — CREATININE, URINE, RANDOM: Creatinine, Urine: 86.76 mg/dL

## 2018-09-27 LAB — GLUCOSE, CAPILLARY
GLUCOSE-CAPILLARY: 89 mg/dL (ref 70–99)
Glucose-Capillary: 97 mg/dL (ref 70–99)

## 2018-09-27 LAB — SODIUM, URINE, RANDOM: Sodium, Ur: 51 mmol/L

## 2018-09-27 MED ORDER — OXYCODONE HCL 5 MG PO TABS
5.0000 mg | ORAL_TABLET | ORAL | Status: DC | PRN
  Administered 2018-10-11 – 2018-10-18 (×5): 5 mg via ORAL
  Filled 2018-09-27 (×5): qty 1

## 2018-09-27 MED ORDER — CLOPIDOGREL BISULFATE 75 MG PO TABS
75.0000 mg | ORAL_TABLET | Freq: Every day | ORAL | Status: DC
  Administered 2018-09-27 – 2018-10-05 (×9): 75 mg via ORAL
  Filled 2018-09-27 (×9): qty 1

## 2018-09-27 MED ORDER — ONDANSETRON HCL 4 MG PO TABS
4.0000 mg | ORAL_TABLET | Freq: Four times a day (QID) | ORAL | Status: DC | PRN
  Administered 2018-10-09: 4 mg via ORAL
  Filled 2018-09-27: qty 1

## 2018-09-27 MED ORDER — SODIUM CHLORIDE 0.9 % IV BOLUS
1000.0000 mL | Freq: Once | INTRAVENOUS | Status: AC
  Administered 2018-09-27: 1000 mL via INTRAVENOUS

## 2018-09-27 MED ORDER — METOPROLOL SUCCINATE ER 25 MG PO TB24
25.0000 mg | ORAL_TABLET | Freq: Every day | ORAL | Status: DC
  Administered 2018-09-27 – 2018-10-08 (×12): 25 mg via ORAL
  Filled 2018-09-27 (×12): qty 1

## 2018-09-27 MED ORDER — LORATADINE 10 MG PO TABS
10.0000 mg | ORAL_TABLET | Freq: Every day | ORAL | Status: DC
  Administered 2018-09-27 – 2018-10-19 (×23): 10 mg via ORAL
  Filled 2018-09-27 (×23): qty 1

## 2018-09-27 MED ORDER — EZETIMIBE 10 MG PO TABS
10.0000 mg | ORAL_TABLET | Freq: Every day | ORAL | Status: DC
  Administered 2018-09-27 – 2018-10-20 (×24): 10 mg via ORAL
  Filled 2018-09-27 (×25): qty 1

## 2018-09-27 MED ORDER — FENOFIBRATE 160 MG PO TABS
160.0000 mg | ORAL_TABLET | Freq: Every day | ORAL | Status: DC
  Administered 2018-09-27 – 2018-10-20 (×23): 160 mg via ORAL
  Filled 2018-09-27 (×25): qty 1

## 2018-09-27 MED ORDER — INSULIN ASPART 100 UNIT/ML ~~LOC~~ SOLN
0.0000 [IU] | Freq: Three times a day (TID) | SUBCUTANEOUS | Status: DC
  Administered 2018-09-29 – 2018-10-13 (×2): 1 [IU] via SUBCUTANEOUS
  Administered 2018-10-13: 5 [IU] via SUBCUTANEOUS
  Administered 2018-10-13: 2 [IU] via SUBCUTANEOUS
  Administered 2018-10-14: 1 [IU] via SUBCUTANEOUS
  Administered 2018-10-14: 3 [IU] via SUBCUTANEOUS
  Administered 2018-10-14 – 2018-10-15 (×3): 1 [IU] via SUBCUTANEOUS
  Administered 2018-10-16 (×2): 2 [IU] via SUBCUTANEOUS
  Administered 2018-10-19: 3 [IU] via SUBCUTANEOUS
  Administered 2018-10-19: 2 [IU] via SUBCUTANEOUS

## 2018-09-27 MED ORDER — ATORVASTATIN CALCIUM 80 MG PO TABS
80.0000 mg | ORAL_TABLET | Freq: Every day | ORAL | Status: DC
  Administered 2018-09-27 – 2018-10-19 (×23): 80 mg via ORAL
  Filled 2018-09-27 (×23): qty 1

## 2018-09-27 MED ORDER — INSULIN ASPART 100 UNIT/ML ~~LOC~~ SOLN
0.0000 [IU] | Freq: Every day | SUBCUTANEOUS | Status: DC
  Administered 2018-10-17: 3 [IU] via SUBCUTANEOUS
  Administered 2018-10-19: 2 [IU] via SUBCUTANEOUS

## 2018-09-27 MED ORDER — LEVOCETIRIZINE DIHYDROCHLORIDE 5 MG PO TABS: 5.0000 mg | ORAL_TABLET | Freq: Every evening | ORAL | Status: DC

## 2018-09-27 MED ORDER — SODIUM CHLORIDE 0.9 % IV SOLN
Freq: Once | INTRAVENOUS | Status: AC
  Administered 2018-09-27: 16:00:00 via INTRAVENOUS

## 2018-09-27 MED ORDER — NICOTINE 21 MG/24HR TD PT24
21.0000 mg | MEDICATED_PATCH | Freq: Every day | TRANSDERMAL | Status: DC
  Administered 2018-09-27 – 2018-10-19 (×23): 21 mg via TRANSDERMAL
  Filled 2018-09-27 (×23): qty 1

## 2018-09-27 MED ORDER — DOXAZOSIN MESYLATE 2 MG PO TABS
4.0000 mg | ORAL_TABLET | Freq: Every day | ORAL | Status: DC
  Administered 2018-09-27 – 2018-10-19 (×23): 4 mg via ORAL
  Filled 2018-09-27 (×24): qty 2

## 2018-09-27 MED ORDER — OXYCODONE-ACETAMINOPHEN 10-325 MG PO TABS: 1.0000 | ORAL_TABLET | ORAL | Status: DC | PRN

## 2018-09-27 MED ORDER — HEPARIN SODIUM (PORCINE) 5000 UNIT/ML IJ SOLN
5000.0000 [IU] | Freq: Two times a day (BID) | INTRAMUSCULAR | Status: DC
  Administered 2018-09-27 – 2018-09-28 (×3): 5000 [IU] via SUBCUTANEOUS
  Filled 2018-09-27 (×3): qty 1

## 2018-09-27 MED ORDER — ONDANSETRON HCL 4 MG/2ML IJ SOLN
4.0000 mg | Freq: Four times a day (QID) | INTRAMUSCULAR | Status: DC | PRN
  Administered 2018-09-28 – 2018-10-19 (×6): 4 mg via INTRAVENOUS
  Filled 2018-09-27 (×7): qty 2

## 2018-09-27 MED ORDER — OXYCODONE-ACETAMINOPHEN 5-325 MG PO TABS
1.0000 | ORAL_TABLET | ORAL | Status: DC | PRN
  Administered 2018-10-10 – 2018-10-18 (×3): 1 via ORAL
  Filled 2018-09-27 (×3): qty 1

## 2018-09-27 MED ORDER — EPINEPHRINE PF 1 MG/10ML IJ SOSY
0.3000 mg | PREFILLED_SYRINGE | INTRAMUSCULAR | Status: DC | PRN
  Filled 2018-09-27: qty 3

## 2018-09-27 MED ORDER — SODIUM CHLORIDE 0.9 % IV SOLN
Freq: Once | INTRAVENOUS | Status: AC
  Administered 2018-09-27: 15:00:00 via INTRAVENOUS

## 2018-09-27 MED ORDER — HYDRALAZINE HCL 20 MG/ML IJ SOLN: 5.0000 mg | Freq: Four times a day (QID) | INTRAMUSCULAR | Status: DC | PRN

## 2018-09-27 MED ORDER — MOMETASONE FURO-FORMOTEROL FUM 200-5 MCG/ACT IN AERO
2.0000 | INHALATION_SPRAY | Freq: Two times a day (BID) | RESPIRATORY_TRACT | Status: DC
  Administered 2018-09-28 – 2018-09-29 (×2): 2 via RESPIRATORY_TRACT
  Filled 2018-09-27: qty 8.8

## 2018-09-27 MED ORDER — EPINEPHRINE 0.3 MG/0.3ML IJ SOAJ: 0.3000 mg | INTRAMUSCULAR | Status: DC

## 2018-09-27 NOTE — ED Notes (Signed)
Hijazi, MD at bedside.  

## 2018-09-27 NOTE — Progress Notes (Signed)
New Admission Note:   Arrival Method: Stretcher from ED Mental Orientation: Alert and oriented x4  Telemetry: NA Assessment: Completed Skin: Intact IV: RAC NS @ 59ml/hr  Pain: 0  Tubes: None Safety Measures: Safety Fall Prevention Plan has been discussed  5 [place] Orientation: Patient has been orientated to the room, unit and staff.   Family: NA  Orders to be reviewed and implemented. Will continue to monitor the patient. Call light has been placed within reach and bed alarm has been activated.   Baldo Ash, RN
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
## 2018-09-27 NOTE — ED Triage Notes (Addendum)
Pt endorses he was taking HCTZ, metformin and losartan which led to his kidney failure. Pt stopped taking those medicines 3 days ago per PCP and abdominal pain stopped. Pt was sent by PCP due to his acute kidney failure (note in chart). Pt endorses lower abdominal pain when he was taking the medicine. Pt alert, but reports feeling slightly confused. Pt denies any difficult or painful urination.

## 2018-09-27 NOTE — Progress Notes (Signed)
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 Widowed    Spouse name: Not on file   Number of children: Not on file   Years of education: Not on file   Highest education level: Not on file  Occupational History   Not on file  Social Needs   Financial resource strain: Not on file   Food insecurity:    Worry: Not on file    Inability: Not on file   Transportation needs:    Medical: Not on file    Non-medical: Not on file  Tobacco Use   Smoking status: Current Every Day Smoker    Packs/day: 2.00    Types: Cigarettes   Smokeless tobacco: Never Used  Substance and Sexual Activity   Alcohol use: No    Alcohol/week: 0.0 standard drinks   Drug use: Not Currently   Sexual activity: Not on file  Lifestyle   Physical activity:    Days per week: Not on file    Minutes per session: Not on file   Stress: Not on file  Relationships   Social connections:    Talks on phone: Not  on file    Gets together: Not on file    Attends religious service: Not on file    Active member of club or organization: Not on file    Attends meetings of clubs or organizations: Not on file    Relationship status: Not on file   Intimate partner violence:    Fear of current or ex partner: Not on file    Emotionally abused: Not on file    Physically abused: Not on file    Forced sexual activity: Not on file  Other Topics Concern   Not on file  Social History Narrative   Not on file      Review of Systems  All other systems reviewed and are negative.      Objective:   Physical Exam Vitals signs reviewed.  Constitutional:      Appearance: Normal appearance. He is normal weight.  Cardiovascular:     Rate and Rhythm: Normal rate and regular rhythm.     Heart sounds: Normal heart sounds. No murmur. No friction rub. No gallop.   Pulmonary:  Effort: Pulmonary effort is normal. No respiratory distress.     Breath sounds: Normal breath sounds. No stridor. No wheezing, rhonchi or rales.  Abdominal:     General: Abdomen is flat. Bowel sounds are normal.     Palpations: Abdomen is soft. There is no mass.     Tenderness: There is no abdominal tenderness. There is no guarding.     Hernia: No hernia is present.  Musculoskeletal:     Right lower leg: No edema.     Left lower leg: No edema.  Skin:    Findings: Lesion:    Neurological:     Mental Status: He is alert.           Assessment & Plan:  Acute renal failure, unspecified acute renal failure type ([place])  Unfortunately, the patient's acute renal failure seems to be worsening despite receiving IV fluids and stopping his medications.  I suspect acute tubular necrosis based on his urinalysis results.  Unfortunately he is failing outpatient conservative therapy and therefore I believe he needs admission with IV fluids and nephrology consultation.  I do not feel this can safely be performed as an outpatient as the  patient is starting to demonstrate symptoms of uremia with his mild confusion this morning and his worsening fatigue.  His lab work also suggest mild metabolic acidosis which may necessitate dialysis if he continues down this pathway.  Therefore he will go directly to the emergency room today.  We will call and notify them of his impending arrival
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
## 2018-09-27 NOTE — ED Notes (Signed)
Pt aware of need for urine sample.  

## 2018-09-27 NOTE — Addendum Note (Signed)
Addended by: Stephens Shire on: 09/27/2018 11:22 AM   Modules accepted: Orders

## 2018-09-27 NOTE — ED Notes (Signed)
ED Provider at bedside. 

## 2018-09-28 ENCOUNTER — Other Ambulatory Visit: Payer: Medicare HMO

## 2018-09-28 DIAGNOSIS — N179 Acute kidney failure, unspecified: Secondary | ICD-10-CM

## 2018-09-28 LAB — IRON AND TIBC
Iron: 34 ug/dL — ABNORMAL LOW (ref 45–182)
Saturation Ratios: 15 % — ABNORMAL LOW (ref 17.9–39.5)
TIBC: 220 ug/dL — ABNORMAL LOW (ref 250–450)
UIBC: 186 ug/dL

## 2018-09-28 LAB — RENAL FUNCTION PANEL
Albumin: 1.7 g/dL — ABNORMAL LOW (ref 3.5–5.0)
Anion gap: 8 (ref 5–15)
BUN: 64 mg/dL — AB (ref 8–23)
CO2: 19 mmol/L — ABNORMAL LOW (ref 22–32)
CREATININE: 5.35 mg/dL — AB (ref 0.61–1.24)
Calcium: 7.6 mg/dL — ABNORMAL LOW (ref 8.9–10.3)
Chloride: 110 mmol/L (ref 98–111)
GFR calc Af Amer: 11 mL/min — ABNORMAL LOW (ref >60)
GFR, EST NON AFRICAN AMERICAN: 10 mL/min — AB (ref >60)
Glucose, Bld: 97 mg/dL (ref 70–99)
Phosphorus: 5.9 mg/dL — ABNORMAL HIGH (ref 2.5–4.6)
Potassium: 3.7 mmol/L (ref 3.5–5.1)
Sodium: 137 mmol/L (ref 135–145)

## 2018-09-28 LAB — GLUCOSE, CAPILLARY
Glucose-Capillary: 78 mg/dL (ref 70–99)
Glucose-Capillary: 107 mg/dL — ABNORMAL HIGH (ref 70–99)
Glucose-Capillary: 102 mg/dL — ABNORMAL HIGH (ref 70–99)
Glucose-Capillary: 83 mg/dL (ref 70–99)

## 2018-09-28 LAB — PROTEIN / CREATININE RATIO, URINE
Creatinine, Urine: 90.84 mg/dL
Protein Creatinine Ratio: 2.55 mg/mg{Cre} — ABNORMAL HIGH (ref 0.00–0.15)
Total Protein, Urine: 232 mg/dL

## 2018-09-28 NOTE — Progress Notes (Signed)
PROGRESS NOTE  [name]. [id number]:[id number] DOB: Apr 22, 1946 DOA: 09/27/2018 PCP: [name], MD   LOS: 1 day   Brief Narrative / Interim history: 73 year old male with history of diabetes mellitus, hypertension, COPD, history of bladder cancer was sent to the hospital from his primary doctor's office for worsening renal failure.  He also reports that he has had a bout of diarrhea for the last 3 weeks that has resolved.  Urinalysis on admission showed 3+ proteinuria as well as microscopic hematuria.  Subjective: Denies any chest pain, denies any shortness of breath.  Reports a metallic taste in his mouth.  Assessment & Plan: Active Problems:   Acute renal failure (ARF) ([name])   Principal Problem Acute kidney injury -Combination of dehydration from GI losses, losartan, hydrochlorothiazide.  He was started on IV fluids on admission but these are now being on hold.  Creatinine this morning with slight improvement -Nephrology consulted, appreciate input -Renal ultrasound without hydronephrosis or acute findings  Active Problems History of bladder cancer -Urinalysis showed microscopic hematuria, he has not seen his urologist in 2 years I discussed with him and strongly recommend that he gets a follow-up appointment.  This can probably be worked up as an outpatient  Type 2 diabetes mellitus -Continue sliding scale  Hypertension -Continue metoprolol  Coronary artery disease -Stable, continue Lipitor and Plavix  Hyperlipidemia -Continue home medications  Scheduled Meds: . atorvastatin  80 mg Oral QHS  . clopidogrel  75 mg Oral Daily  . doxazosin  4 mg Oral Daily  . ezetimibe  10 mg Oral Daily  . fenofibrate  160 mg Oral Daily  . heparin  5,000 Units Subcutaneous Q12H  . insulin aspart  0-5 Units Subcutaneous QHS  . insulin aspart  0-9 Units Subcutaneous TID WC  . loratadine  10 mg Oral QHS  . metoprolol succinate  25 mg Oral Daily  . mometasone-formoterol  2 puff  Inhalation BID  . nicotine  21 mg Transdermal QHS   Continuous Infusions: PRN Meds:.EPINEPHrine, hydrALAZINE, ondansetron **OR** ondansetron (ZOFRAN) IV, oxyCODONE-acetaminophen **AND** oxyCODONE  DVT prophylaxis: heparin Code Status: Full code Family Communication: no family at bedside Disposition Plan: home when ready   Consultants:   Nephrology   Procedures:   None   Antimicrobials:  None    Objective: Vitals:   09/27/18 1522 09/27/18 1836 09/27/18 2153 09/28/18 0545  BP: (!) 166/88 (!) 166/88 (!) 151/59 (!) 145/68  Pulse: 77 77 89 86  Resp: 18 18 18 19   Temp: 97.8 F (36.6 C) 97.8 F (36.6 C) 98 F (36.7 C) 98.4 F (36.9 C)  TempSrc: Oral Oral    SpO2: 100%  93% 95%  Weight:  88.9 kg    Height:  5\' 10"  (1.778 m)      Intake/Output Summary (Last 24 hours) at 09/28/2018 0808 Last data filed at 09/28/2018 0545 Gross per 24 hour  Intake 3225 ml  Output 850 ml  Net 2375 ml   Filed Weights   09/27/18 1836  Weight: 88.9 kg    Examination:  Constitutional: NAD Eyes: lids and conjunctivae normal ENMT: Mucous membranes are moist.  Respiratory: clear to auscultation bilaterally, no wheezing, no crackles. Cardiovascular: Regular rate and rhythm, no murmurs / rubs / gallops. No LE edema. Abdomen: no tenderness. Bowel sounds positive.  Musculoskeletal: no clubbing / cyanosis. Skin: no rashes Neurologic: CN 2-12 grossly intact. Strength 5/5 in all 4.   Data Reviewed: I have independently reviewed following labs and imaging studies  CBC: Recent Labs  Lab 09/23/18 1005 09/27/18 1255  WBC 5.1 4.7  NEUTROABS 3,483 2.8  HGB 9.8* 9.5*  HCT 30.2* 29.7*  MCV 86.5 88.1  PLT 153 631*   Basic Metabolic Panel: Recent Labs  Lab 09/23/18 1005 09/26/18 1223 09/27/18 1122 09/27/18 1255 09/28/18 0354  NA 135 138 136 135 137  K 4.1 4.2 4.1 3.9 3.7  CL 105 107 106 105 110  CO2 22 15* 22 21* 19*  GLUCOSE 143* 142* 115* 111* 97  BUN 58* 70* 67* 68* 64*   CREATININE 4.67* 6.02* 5.47* 5.76* 5.35*  CALCIUM 8.1* 7.9* 7.5* 7.8* 7.6*  PHOS  --   --   --   --  5.9*   GFR: Estimated Creatinine Clearance: 13.6 mL/min (A) (by C-G formula based on SCr of 5.35 mg/dL (H)). Liver Function Tests: Recent Labs  Lab 09/23/18 1005 09/28/18 0354  AST 41*  --   ALT 18  --   BILITOT 0.6  --   PROT 6.8  --   ALBUMIN  --  1.7*   Recent Labs  Lab 09/23/18 1005  LIPASE 43   No results for input(s): AMMONIA in the last 168 hours. Coagulation Profile: No results for input(s): INR, PROTIME in the last 168 hours. Cardiac Enzymes: No results for input(s): CKTOTAL, CKMB, CKMBINDEX, TROPONINI in the last 168 hours. BNP (last 3 results) No results for input(s): PROBNP in the last 8760 hours. HbA1C: No results for input(s): HGBA1C in the last 72 hours. CBG: Recent Labs  Lab 09/27/18 1524 09/27/18 2152 09/28/18 0719  GLUCAP 89 97 78   Lipid Profile: No results for input(s): CHOL, HDL, LDLCALC, TRIG, CHOLHDL, LDLDIRECT in the last 72 hours. Thyroid Function Tests: No results for input(s): TSH, T4TOTAL, FREET4, T3FREE, THYROIDAB in the last 72 hours. Anemia Panel: No results for input(s): VITAMINB12, FOLATE, FERRITIN, TIBC, IRON, RETICCTPCT in the last 72 hours. Urine analysis:    Component Value Date/Time   COLORURINE AMBER (A) 09/27/2018 1440   APPEARANCEUR HAZY (A) 09/27/2018 1440   LABSPEC 1.012 09/27/2018 1440   PHURINE 5.0 09/27/2018 1440   GLUCOSEU NEGATIVE 09/27/2018 1440   HGBUR LARGE (A) 09/27/2018 1440   BILIRUBINUR NEGATIVE 09/27/2018 1440   KETONESUR NEGATIVE 09/27/2018 1440   PROTEINUR 100 (A) 09/27/2018 1440   NITRITE NEGATIVE 09/27/2018 1440   LEUKOCYTESUR NEGATIVE 09/27/2018 1440   Sepsis Labs: Invalid input(s): PROCALCITONIN, LACTICIDVEN  No results found for this or any previous visit (from the past 240 hour(s)).    Radiology Studies: US Renal  Result Date: 09/27/2018 CLINICAL DATA:  Acute renal failure EXAM: RENAL  / URINARY TRACT ULTRASOUND COMPLETE COMPARISON:  CT 05/06/2018 FINDINGS: Right Kidney: Renal measurements: 12.4 x 6.5 x 5.4 cm = volume: 229 mL . Echogenicity within normal limits. No mass or hydronephrosis visualized. Left Kidney: Renal measurements: 14.5 x 5.7 x 6.5 cm = volume: 279 mL. Small cysts in the left kidney, the largest 1.2 cm. Normal echotexture. No hydronephrosis. Bladder: Appears normal for degree of bladder distention. IMPRESSION: No acute findings.  No hydronephrosis. Electronically Signed   By: Rolm Baptise M.D.   On: 09/27/2018 11:16    Marzetta Board, MD, PhD [facility]  Contact via  [email or web address]  [place] P: [phone]  F: [phone]

## 2018-09-28 NOTE — Progress Notes (Signed)
Discontinued IVF.

## 2018-09-28 NOTE — Progress Notes (Signed)
Subjective: Interval History: has no complaint , starting to feel better..  Objective: Vital signs in last 24 hours: Temp:  [97.7 F (36.5 C)-98.4 F (36.9 C)] 98.4 F (36.9 C) (04/01 0545) Pulse Rate:  [68-89] 86 (04/01 0545) Resp:  [18-99] 19 (04/01 0545) BP: (145-172)/(59-89) 145/68 (04/01 0545) SpO2:  [93 %-100 %] 95 % (04/01 0545) Weight:  [88.9 kg] 88.9 kg (03/31 1836) Weight change:   Intake/Output from previous day: 03/31 0701 - 04/01 0700 In: 3665 [P.O.:540; I.V.:125; IV Piggyback:3000] Out: 850 [Urine:850] Intake/Output this shift: No intake/output data recorded.  General appearance: alert, cooperative, no distress, mildly obese and pale Resp: diminished breath sounds bilaterally Cardio: S1, S2 normal and systolic murmur: systolic ejection 2/6, crescendo and decrescendo at 2nd left intercostal space, fem , AX-AX and fem-fem bruits GI: soft,pos bs. liver down 5 cm Extremities: edema 2+  Lab Results: Recent Labs    09/27/18 1255  WBC 4.7  HGB 9.5*  HCT 29.7*  PLT 137*   BMET:  Recent Labs    09/27/18 1255 09/28/18 0354  NA 135 137  K 3.9 3.7  CL 105 110  CO2 21* 19*  GLUCOSE 111* 97  BUN 68* 64*  CREATININE 5.76* 5.35*  CALCIUM 7.8* 7.6*   No results for input(s): PTH in the last 72 hours. Iron Studies: No results for input(s): IRON, TIBC, TRANSFERRIN, FERRITIN in the last 72 hours.  Studies/Results: US Renal  Result Date: 09/27/2018 CLINICAL DATA:  Acute renal failure EXAM: RENAL / URINARY TRACT ULTRASOUND COMPLETE COMPARISON:  CT 05/06/2018 FINDINGS: Right Kidney: Renal measurements: 12.4 x 6.5 x 5.4 cm = volume: 229 mL . Echogenicity within normal limits. No mass or hydronephrosis visualized. Left Kidney: Renal measurements: 14.5 x 5.7 x 6.5 cm = volume: 279 mL. Small cysts in the left kidney, the largest 1.2 cm. Normal echotexture. No hydronephrosis. Bladder: Appears normal for degree of bladder distention. IMPRESSION: No acute findings.  No  hydronephrosis. Electronically Signed   By: Rolm Baptise M.D.   On: 09/27/2018 11:16    I have reviewed the patient's current medications.  Assessment/Plan: 1 AKI maybe a little better, good urine output, urine chem and u/s ok.  2 Anemia check Fe 3Hematuria needs Urology 4 DM controlled 5 PVD 6 CAD P stop ivf, cont bicarb, checkFe, follow chem    LOS: 1 day   Jeneen Rinks Marletta Bousquet 09/28/2018,9:32 AM

## 2018-09-29 ENCOUNTER — Ambulatory Visit: Payer: Medicare HMO | Admitting: Family Medicine

## 2018-09-29 LAB — CBC
HCT: 24.4 % — ABNORMAL LOW (ref 39.0–52.0)
Hemoglobin: 7.9 g/dL — ABNORMAL LOW (ref 13.0–17.0)
MCH: 27.8 pg (ref 26.0–34.0)
MCHC: 32.4 g/dL (ref 30.0–36.0)
MCV: 85.9 fL (ref 80.0–100.0)
Platelets: 128 10*3/uL — ABNORMAL LOW (ref 150–400)
RBC: 2.84 MIL/uL — ABNORMAL LOW (ref 4.22–5.81)
RDW: 16.9 % — ABNORMAL HIGH (ref 11.5–15.5)
WBC: 4.5 10*3/uL (ref 4.0–10.5)
nRBC: 0 % (ref 0.0–0.2)

## 2018-09-29 LAB — GLUCOSE, CAPILLARY
Glucose-Capillary: 112 mg/dL — ABNORMAL HIGH (ref 70–99)
Glucose-Capillary: 103 mg/dL — ABNORMAL HIGH (ref 70–99)
Glucose-Capillary: 128 mg/dL — ABNORMAL HIGH (ref 70–99)
Glucose-Capillary: 82 mg/dL (ref 70–99)

## 2018-09-29 LAB — RENAL FUNCTION PANEL
Albumin: 1.6 g/dL — ABNORMAL LOW (ref 3.5–5.0)
Anion gap: 6 (ref 5–15)
BUN: 63 mg/dL — ABNORMAL HIGH (ref 8–23)
CO2: 21 mmol/L — ABNORMAL LOW (ref 22–32)
Calcium: 7.6 mg/dL — ABNORMAL LOW (ref 8.9–10.3)
Chloride: 111 mmol/L (ref 98–111)
Creatinine, Ser: 5.51 mg/dL — ABNORMAL HIGH (ref 0.61–1.24)
GFR calc Af Amer: 11 mL/min — ABNORMAL LOW
GFR calc non Af Amer: 9 mL/min — ABNORMAL LOW
Glucose, Bld: 105 mg/dL — ABNORMAL HIGH (ref 70–99)
Phosphorus: 5.8 mg/dL — ABNORMAL HIGH (ref 2.5–4.6)
Potassium: 3.8 mmol/L (ref 3.5–5.1)
Sodium: 138 mmol/L (ref 135–145)

## 2018-09-29 NOTE — Progress Notes (Signed)
Subjective: Interval History: has no complaint , no N, v, able to eat ok. .  Objective: Vital signs in last 24 hours: Temp:  [97.7 F (36.5 C)-98.7 F (37.1 C)] 98.3 F (36.8 C) (04/02 0929) Pulse Rate:  [72-86] 84 (04/02 0929) Resp:  [18-20] 18 (04/02 0929) BP: (139-153)/(72-81) 143/81 (04/02 0929) SpO2:  [95 %-99 %] 97 % (04/02 0929) Weight change:   Intake/Output from previous day: 04/01 0701 - 04/02 0700 In: 880 [P.O.:880] Out: 775 [Urine:775] Intake/Output this shift: No intake/output data recorded.  General appearance: alert, cooperative and no distress Resp: clear to auscultation bilaterally Cardio: S1, S2 normal and systolic murmur: systolic ejection 2/6, crescendo and decrescendo at 2nd left intercostal space GI: soft, non-tender; bowel sounds normal; no masses,  no organomegaly Extremities: edema 2+  Bruits in AX-FEM, Fem-Fem  Lab Results: Recent Labs    09/27/18 1255 09/29/18 0457  WBC 4.7 4.5  HGB 9.5* 7.9*  HCT 29.7* 24.4*  PLT 137* 128*   BMET:  Recent Labs    09/28/18 0354 09/29/18 0457  NA 137 138  K 3.7 3.8  CL 110 111  CO2 19* 21*  GLUCOSE 97 105*  BUN 64* 63*  CREATININE 5.35* 5.51*  CALCIUM 7.6* 7.6*   No results for input(s): PTH in the last 72 hours. Iron Studies:  Recent Labs    09/28/18 1013  IRON 34*  TIBC 220*    Studies/Results: US Renal  Result Date: 09/27/2018 CLINICAL DATA:  Acute renal failure EXAM: RENAL / URINARY TRACT ULTRASOUND COMPLETE COMPARISON:  CT 05/06/2018 FINDINGS: Right Kidney: Renal measurements: 12.4 x 6.5 x 5.4 cm = volume: 229 mL . Echogenicity within normal limits. No mass or hydronephrosis visualized. Left Kidney: Renal measurements: 14.5 x 5.7 x 6.5 cm = volume: 279 mL. Small cysts in the left kidney, the largest 1.2 cm. Normal echotexture. No hydronephrosis. Bladder: Appears normal for degree of bladder distention. IMPRESSION: No acute findings.  No hydronephrosis. Electronically Signed   By: Rolm Baptise M.D.   On: 09/27/2018 11:16    I have reviewed the patient's current medications.  Assessment/Plan: 1 AKI mild vol xs. Mild acidemia.  No improvement GFR.  Has established ATN.  Will tx conservatively 2 DM controlled 3 HTN mild ^bp 4 COPd 5 CAD 6 PVD P follow chem, cont diet, if cont stable, will d/c in 2-3d and follow outpatient   LOS: 2 days   Jeneen Rinks Saja Bartolini 09/29/2018,10:23 AM

## 2018-09-29 NOTE — Consult Note (Addendum)
   [facility] CM Inpatient Consult   09/29/2018  [name]. Nov 22, 1945 [id number]    Patient screened for potential [facility] Care Management services with 17% (medium) unplanned readmission and hospitalization risk score and as a benefit from his [facility] plan. Patient was also noted to have Medicaid.  Per chart review, patient was sent to the hospital from his primary care provider's office due to worsening renal failure (acute kidney Injury). Has had diarrhea for the last 3 weeks that has recently resolved and with urinalysis which showed 3+ proteinuria and microscopic hematuria.  History and physical on 09/27/18 shows as follows: Mr. [name] is a 73 y.o. male, with past medical history significant for diabetes mellitus, degenerative joint disease, hypertension and COPD in addition to CAD.  Primary care provider is Dr. [name] with [facility] that is listed as providing transition of care (TOC).   Spoke with transition of care RN CM who states that current disposition plan for patient is home and denies any barriers or needs at this time. She mentioned that patient has been followed for elevated creatinine by Nephrology and IV fluid was discontinued. Patient is ambulating in room with walker and has been independent with use of cane at home. Patient had been living independently at home.  No identifiable [facility] Care Management needs at this point but will benefit from [place] calls to follow-up his recovery at home.  Will refer patient for [facility] General calls to follow-up post discharge.  If patient's post hospital needs change, please place a [facility] Care Management consult.    Addendum:  Patient noted to be on HMO SNP program through his insurance plan.    For questions and referrals, please call:  Edwena Felty A. Earlin Sweeden, BSN, RN-BC [facility] Liaison Cell: [phone]

## 2018-09-29 NOTE — Progress Notes (Signed)
PROGRESS NOTE  [name]. TFT:[id number] DOB: August 05, 1945 DOA: 09/27/2018 PCP: [name], MD   LOS: 2 days   Brief Narrative / Interim history: 73 year old male with history of diabetes mellitus, hypertension, COPD, history of bladder cancer was sent to the hospital from his primary doctor's office for worsening renal failure.  He also reports that he has had a bout of diarrhea for the last 3 weeks that has resolved.  Urinalysis on admission showed 3+ proteinuria as well as microscopic hematuria.  Subjective: He reports some abdominal cramping overnight last night around 3 AM, felt a lot better after drinking glass of milk.  No diarrhea, no nausea or vomiting.  Assessment & Plan: Active Problems:   Acute renal failure (ARF) ([name])   Principal Problem Acute kidney injury -Combination of dehydration from GI losses, losartan, hydrochlorothiazide.  He was started on IV fluids on admission but these are now being on hold. -Nephrology consulted, appreciate input -Renal ultrasound without hydronephrosis or acute findings -Creatinine overall stable, discussed with nephrology Dr. Jimmy Footman continue to monitor for the next day or 2  Active Problems History of bladder cancer -Urinalysis showed microscopic hematuria, he has not seen his urologist in 2 years I discussed with him and strongly recommend that he gets a follow-up appointment.  This can probably be worked up as an outpatient  Type 2 diabetes mellitus -CBGs well controlled, keep on sliding scale  Hypertension -Continue metoprolol, blood pressure on the high side 140s-150s  Coronary artery disease -Stable, continue Lipitor and Plavix.  No chest pain  Hyperlipidemia -Continue home medications  Scheduled Meds: . atorvastatin  80 mg Oral QHS  . clopidogrel  75 mg Oral Daily  . doxazosin  4 mg Oral Daily  . ezetimibe  10 mg Oral Daily  . fenofibrate  160 mg Oral Daily  . insulin aspart  0-5 Units Subcutaneous QHS   . insulin aspart  0-9 Units Subcutaneous TID WC  . loratadine  10 mg Oral QHS  . metoprolol succinate  25 mg Oral Daily  . mometasone-formoterol  2 puff Inhalation BID  . nicotine  21 mg Transdermal QHS   Continuous Infusions: PRN Meds:.EPINEPHrine, hydrALAZINE, ondansetron **OR** ondansetron (ZOFRAN) IV, oxyCODONE-acetaminophen **AND** oxyCODONE  DVT prophylaxis: heparin Code Status: Full code Family Communication: no family at bedside Disposition Plan: home when ready   Consultants:   Nephrology   Procedures:   None   Antimicrobials:  None    Objective: Vitals:   09/28/18 1946 09/28/18 2009 09/29/18 0454 09/29/18 0929  BP: (!) 150/72  (!) 145/73 (!) 143/81  Pulse: 73  86 84  Resp: 20  20 18   Temp: 97.9 F (36.6 C)  98.2 F (36.8 C) 98.3 F (36.8 C)  TempSrc: Oral  Oral Oral  SpO2: 96% 95% 99% 97%  Weight:      Height:        Intake/Output Summary (Last 24 hours) at 09/29/2018 1031 Last data filed at 09/29/2018 7062 Gross per 24 hour  Intake 880 ml  Output 775 ml  Net 105 ml   Filed Weights   09/27/18 1836  Weight: 88.9 kg    Examination:  Constitutional: NAD Eyes: no scleral icterus ENMT: mmm Respiratory: CTA biL, no wheezing, no crackles  Cardiovascular: RRR, no murmurs. Trace edema  Abdomen: soft, NT, ND, BS+ Musculoskeletal: no clubbing / cyanosis. Skin: no rashes seen Neurologic: non focal   Data Reviewed: I have independently reviewed following labs and imaging studies   CBC: Recent  Labs  Lab 09/23/18 1005 09/27/18 1255 09/29/18 0457  WBC 5.1 4.7 4.5  NEUTROABS 3,483 2.8  --   HGB 9.8* 9.5* 7.9*  HCT 30.2* 29.7* 24.4*  MCV 86.5 88.1 85.9  PLT 153 137* 354*   Basic Metabolic Panel: Recent Labs  Lab 09/26/18 1223 09/27/18 1122 09/27/18 1255 09/28/18 0354 09/29/18 0457  NA 138 136 135 137 138  K 4.2 4.1 3.9 3.7 3.8  CL 107 106 105 110 111  CO2 15* 22 21* 19* 21*  GLUCOSE 142* 115* 111* 97 105*  BUN 70* 67* 68* 64* 63*   CREATININE 6.02* 5.47* 5.76* 5.35* 5.51*  CALCIUM 7.9* 7.5* 7.8* 7.6* 7.6*  PHOS  --   --   --  5.9* 5.8*   GFR: Estimated Creatinine Clearance: 13.2 mL/min (A) (by C-G formula based on SCr of 5.51 mg/dL (H)). Liver Function Tests: Recent Labs  Lab 09/23/18 1005 09/28/18 0354 09/29/18 0457  AST 41*  --   --   ALT 18  --   --   BILITOT 0.6  --   --   PROT 6.8  --   --   ALBUMIN  --  1.7* 1.6*   Recent Labs  Lab 09/23/18 1005  LIPASE 43   No results for input(s): AMMONIA in the last 168 hours. Coagulation Profile: No results for input(s): INR, PROTIME in the last 168 hours. Cardiac Enzymes: No results for input(s): CKTOTAL, CKMB, CKMBINDEX, TROPONINI in the last 168 hours. BNP (last 3 results) No results for input(s): PROBNP in the last 8760 hours. HbA1C: No results for input(s): HGBA1C in the last 72 hours. CBG: Recent Labs  Lab 09/28/18 0719 09/28/18 1139 09/28/18 1617 09/28/18 2115 09/29/18 0758  GLUCAP 78 107* 102* 83 112*   Lipid Profile: No results for input(s): CHOL, HDL, LDLCALC, TRIG, CHOLHDL, LDLDIRECT in the last 72 hours. Thyroid Function Tests: No results for input(s): TSH, T4TOTAL, FREET4, T3FREE, THYROIDAB in the last 72 hours. Anemia Panel: Recent Labs    09/28/18 1013  TIBC 220*  IRON 34*   Urine analysis:    Component Value Date/Time   COLORURINE AMBER (A) 09/27/2018 1440   APPEARANCEUR HAZY (A) 09/27/2018 1440   LABSPEC 1.012 09/27/2018 1440   PHURINE 5.0 09/27/2018 1440   GLUCOSEU NEGATIVE 09/27/2018 1440   HGBUR LARGE (A) 09/27/2018 1440   BILIRUBINUR NEGATIVE 09/27/2018 1440   KETONESUR NEGATIVE 09/27/2018 1440   PROTEINUR 100 (A) 09/27/2018 1440   NITRITE NEGATIVE 09/27/2018 1440   LEUKOCYTESUR NEGATIVE 09/27/2018 1440   Sepsis Labs: Invalid input(s): PROCALCITONIN, LACTICIDVEN  No results found for this or any previous visit (from the past 240 hour(s)).    Radiology Studies: US Renal  Result Date: 09/27/2018 CLINICAL  DATA:  Acute renal failure EXAM: RENAL / URINARY TRACT ULTRASOUND COMPLETE COMPARISON:  CT 05/06/2018 FINDINGS: Right Kidney: Renal measurements: 12.4 x 6.5 x 5.4 cm = volume: 229 mL . Echogenicity within normal limits. No mass or hydronephrosis visualized. Left Kidney: Renal measurements: 14.5 x 5.7 x 6.5 cm = volume: 279 mL. Small cysts in the left kidney, the largest 1.2 cm. Normal echotexture. No hydronephrosis. Bladder: Appears normal for degree of bladder distention. IMPRESSION: No acute findings.  No hydronephrosis. Electronically Signed   By: Rolm Baptise M.D.   On: 09/27/2018 11:16    Marzetta Board, MD, PhD [facility]  Contact via  [email or web address]  [place] P: [phone]  F: [phone]

## 2018-09-30 LAB — PROTEIN ELECTROPHORESIS, SERUM
ALBUMIN ELP: 2.4 g/dL — AB (ref 3.8–4.8)
ALPHA 1: 0.4 g/dL — AB (ref 0.2–0.3)
Abnormal Protein Band1: 1.2 g/dL — ABNORMAL HIGH
Abnormal Protein Band2: 0.4 g/dL — ABNORMAL HIGH
Alpha 2: 0.7 g/dL (ref 0.5–0.9)
Beta 2: 0.4 g/dL (ref 0.2–0.5)
Beta Globulin: 0.4 g/dL (ref 0.4–0.6)
Gamma Globulin: 2.2 g/dL — ABNORMAL HIGH (ref 0.8–1.7)
Total Protein: 6.5 g/dL (ref 6.1–8.1)

## 2018-09-30 LAB — RENAL FUNCTION PANEL
Albumin: 1.7 g/dL — ABNORMAL LOW (ref 3.5–5.0)
Anion gap: 8 (ref 5–15)
BUN: 61 mg/dL — ABNORMAL HIGH (ref 8–23)
CO2: 20 mmol/L — ABNORMAL LOW (ref 22–32)
Calcium: 7.5 mg/dL — ABNORMAL LOW (ref 8.9–10.3)
Chloride: 106 mmol/L (ref 98–111)
Creatinine, Ser: 5.57 mg/dL — ABNORMAL HIGH (ref 0.61–1.24)
GFR calc Af Amer: 11 mL/min — ABNORMAL LOW (ref >60)
GFR calc non Af Amer: 9 mL/min — ABNORMAL LOW (ref >60)
Glucose, Bld: 96 mg/dL (ref 70–99)
Phosphorus: 5.7 mg/dL — ABNORMAL HIGH (ref 2.5–4.6)
Potassium: 4 mmol/L (ref 3.5–5.1)
Sodium: 134 mmol/L — ABNORMAL LOW (ref 135–145)

## 2018-09-30 LAB — GLUCOSE, CAPILLARY
Glucose-Capillary: 80 mg/dL (ref 70–99)
Glucose-Capillary: 97 mg/dL (ref 70–99)
Glucose-Capillary: 104 mg/dL — ABNORMAL HIGH (ref 70–99)
Glucose-Capillary: 84 mg/dL (ref 70–99)

## 2018-09-30 MED ORDER — GLUCERNA SHAKE PO LIQD
237.0000 mL | Freq: Two times a day (BID) | ORAL | Status: DC
  Administered 2018-09-30 – 2018-10-05 (×3): 237 mL via ORAL
  Filled 2018-09-30 (×13): qty 237

## 2018-09-30 MED ORDER — SODIUM CHLORIDE 0.9 % IV SOLN
510.0000 mg | Freq: Once | INTRAVENOUS | Status: AC
  Administered 2018-09-30: 510 mg via INTRAVENOUS
  Filled 2018-09-30: qty 17

## 2018-09-30 NOTE — Progress Notes (Signed)
Initial Nutrition Assessment  RD working remotely.  DOCUMENTATION CODES:   Not applicable  INTERVENTION:   - Add Glucerna BID (each provides 220 kcal, 10 g protein)   NUTRITION DIAGNOSIS:   Inadequate oral intake related to poor appetite, early satiety as evidenced by per patient/family report, estimated needs.  GOAL:   Patient will meet greater than or equal to 90% of their needs  MONITOR:   PO intake, Supplement acceptance, Weight trends, Labs, I & O's  REASON FOR ASSESSMENT:   Malnutrition Screening Tool    ASSESSMENT:   73 yo male, admitted with acute renal failure. PMH significant for h/o bladder cancer, COPD, CAD, CM, h/o colonic polyps, hyperlipidemia, HTN, PAD.  Labs: sodium 134 (L), BUN 61 (H), Creatinine 5.57 (H), phosphorus 5.7 (H)  Meds: Lipitor, Novolog  RD spoke with pt on phone. Endorses 15 lb wt loss x 4 weeks. Report eating "like a bird" and having an alpha-gal allergy, wishes the kitchen would stop sending so much meat on his trays. Does not like chicken or [place]. Discussed other protein sources. Pt amenable to Glucerna BID in chocolate or vanilla.  NUTRITION - FOCUSED PHYSICAL EXAM: Deferred - RD working remotely  Diet Order:  Allergic to red meat, pork products, shellfish Diet Order            Diet Carb Modified Fluid consistency: Thin; Room service appropriate? Yes  Diet effective now            PO Intake 100% x 2 meals on 4/1 0-25% x 3 meals on 4/2  EDUCATION NEEDS:  Education needs have been addressed  Skin:  Skin Assessment: Skin Integrity Issues: Skin Integrity Issues:: Other (Comment) Other: Ecchymosis - BL arms  Last BM:  4/2  Height:  Ht Readings from Last 1 Encounters:  09/27/18 5\' 10"  (1.778 m)    Weight:  Wt Readings from Last 10 Encounters:  09/27/18 88.9 kg  09/26/18 88.9 kg  09/23/18 87.1 kg  09/05/18 89.8 kg  07/19/18 92.5 kg  06/10/18 92.4 kg  06/02/18 92.4 kg  05/09/18 92.8 kg  05/02/18 94.1 kg   03/25/18 93 kg  2.7 kg wt loss x 6 weeks, ~3% not significant  Ideal Body Weight:  75.5 kg  BMI:  Body mass index is 28.12 kg/m. considered protective for age group  Estimated Nutritional Needs:   Kcal:  1900-2280 (25-30 kcal/kg IBW)  Protein:  91-114 gm (1.2-1.5 g/kg IBW)  Fluid:  >/= 1.5 L daily or per MD  Althea Grimmer, MS, RDN, LDN Pager: [phone] Available Mondays and Fridays, 9am-2pm

## 2018-09-30 NOTE — Progress Notes (Signed)
Subjective: Interval History: has complaints feels bladder not emptying.  Objective: Vital signs in last 24 hours: Temp:  [98.2 F (36.8 C)-98.5 F (36.9 C)] 98.5 F (36.9 C) (04/03 0351) Pulse Rate:  [77-87] 87 (04/03 0351) Resp:  [17-20] 20 (04/03 0351) BP: (129-160)/(74-82) 160/82 (04/03 0351) SpO2:  [95 %-97 %] 95 % (04/03 0351) Weight change:   Intake/Output from previous day: 04/02 0701 - 04/03 0700 In: 720 [P.O.:720] Out: 975 [Urine:975] Intake/Output this shift: No intake/output data recorded.  General appearance: alert, cooperative, no distress and mildly obese Resp: diminished breath sounds bilaterally Cardio: S1, S2 normal and systolic murmur: systolic ejection 2/6, crescendo and decrescendo at 2nd left intercostal space GI: soft, pos bs, liver down 4 cm Extremities: edema 1-2+  Lab Results: Recent Labs    09/27/18 1255 09/29/18 0457  WBC 4.7 4.5  HGB 9.5* 7.9*  HCT 29.7* 24.4*  PLT 137* 128*   BMET:  Recent Labs    09/29/18 0457 09/30/18 0540  NA 138 134*  K 3.8 4.0  CL 111 106  CO2 21* 20*  GLUCOSE 105* 96  BUN 63* 61*  CREATININE 5.51* 5.57*  CALCIUM 7.6* 7.5*   No results for input(s): PTH in the last 72 hours. Iron Studies:  Recent Labs    09/28/18 1013  IRON 34*  TIBC 220*    Studies/Results: No results found.  I have reviewed the patient's current medications.  Assessment/Plan: 1 AKI Cr minimal ^,  ? Some element BOO.   2 Anemia mild lower 3 DM controlled 4 Bladder Ca , hematuria 5 COPD 6 PVD 7 CAD P follow PVRs, follow Cr. Urology f/u outpatient    LOS: 3 days   Jeneen Rinks Tayjah Lobdell 09/30/2018,9:18 AM

## 2018-09-30 NOTE — Care Management Important Message (Signed)
Important Message  Patient Details  Name: [name]. MRN: [id number] Date of Birth: 14-Sep-1945   Medicare Important Message Given:  Yes    Kenlie Seki 09/30/2018, 3:44 PM

## 2018-09-30 NOTE — Progress Notes (Signed)
PROGRESS NOTE  [name]. MEQ:[id number] DOB: [birth year] DOA: 09/27/2018 PCP: [name], MD   LOS: 3 days   Brief Narrative / Interim history: 73 year old male with history of diabetes mellitus, hypertension, COPD, history of bladder cancer was sent to the hospital from his primary doctor's office for worsening renal failure.  He also reports that he has had a bout of diarrhea for the last 3 weeks that has resolved.  Urinalysis on admission showed 3+ proteinuria as well as microscopic hematuria.  Subjective: -no chest pain, nausea/vomiting. Appetite better.   Assessment & Plan: Active Problems:   Acute renal failure (ARF) ([name])   Principal Problem Acute kidney injury -Combination of dehydration from GI losses, losartan, hydrochlorothiazide.  He was started on IV fluids on admission but these are now being on hold. -Nephrology consulted, appreciate input -Renal ultrasound without hydronephrosis or acute findings -Creatinine not improving, but follow post void residuals   Active Problems History of bladder cancer -Urinalysis showed microscopic hematuria, he has not seen his urologist in 2 years I discussed with him and strongly recommend that he gets a follow-up appointment.    Type 2 diabetes mellitus -CBGs well controlled, keep on sliding scale  Hypertension -Continue metoprolol, blood pressure stable  Coronary artery disease -Stable, continue Lipitor and Plavix.  No chest pain  Hyperlipidemia -Continue home medications  Scheduled Meds: . atorvastatin  80 mg Oral QHS  . clopidogrel  75 mg Oral Daily  . doxazosin  4 mg Oral Daily  . ezetimibe  10 mg Oral Daily  . fenofibrate  160 mg Oral Daily  . insulin aspart  0-5 Units Subcutaneous QHS  . insulin aspart  0-9 Units Subcutaneous TID WC  . loratadine  10 mg Oral QHS  . metoprolol succinate  25 mg Oral Daily  . mometasone-formoterol  2 puff Inhalation BID  . nicotine  21 mg Transdermal QHS   Continuous  Infusions: . ferumoxytol     PRN Meds:.EPINEPHrine, hydrALAZINE, ondansetron **OR** ondansetron (ZOFRAN) IV, oxyCODONE-acetaminophen **AND** oxyCODONE  DVT prophylaxis: heparin Code Status: Full code Family Communication: no family at bedside Disposition Plan: home when ready   Consultants:   Nephrology   Procedures:   None   Antimicrobials:  None    Objective: Vitals:   09/29/18 0929 09/29/18 1838 09/29/18 2020 09/30/18 0351  BP: (!) 143/81 134/77 129/74 (!) 160/82  Pulse: 84 85 77 87  Resp: 18 17 20 20   Temp: 98.3 F (36.8 C) 98.3 F (36.8 C) 98.2 F (36.8 C) 98.5 F (36.9 C)  TempSrc: Oral Oral Oral Oral  SpO2: 97% 96% 97% 95%  Weight:      Height:        Intake/Output Summary (Last 24 hours) at 09/30/2018 2979 Last data filed at 09/30/2018 8921 Gross per 24 hour  Intake 720 ml  Output 975 ml  Net -255 ml   Filed Weights   09/27/18 1836  Weight: 88.9 kg    Examination:  Constitutional: No distress Eyes: Lids and conjunctive are normal ENMT: Moist mucous membranes Respiratory: Clear to auscultation bilaterally without wheezing Cardiovascular: Regular rate and rhythm, no murmurs, trace edema Abdomen: Soft, nontender, nondistended Musculoskeletal: no clubbing / cyanosis. Skin: No new rashes Neurologic: No focal deficits  Data Reviewed: I have independently reviewed following labs and imaging studies   CBC: Recent Labs  Lab 09/23/18 1005 09/27/18 1255 09/29/18 0457  WBC 5.1 4.7 4.5  NEUTROABS 3,483 2.8  --   HGB 9.8* 9.5* 7.9*  HCT 30.2* 29.7* 24.4*  MCV 86.5 88.1 85.9  PLT 153 137* 818*   Basic Metabolic Panel: Recent Labs  Lab 09/27/18 1122 09/27/18 1255 09/28/18 0354 09/29/18 0457 09/30/18 0540  NA 136 135 137 138 134*  K 4.1 3.9 3.7 3.8 4.0  CL 106 105 110 111 106  CO2 22 21* 19* 21* 20*  GLUCOSE 115* 111* 97 105* 96  BUN 67* 68* 64* 63* 61*  CREATININE 5.47* 5.76* 5.35* 5.51* 5.57*  CALCIUM 7.5* 7.8* 7.6* 7.6* 7.5*  PHOS   --   --  5.9* 5.8* 5.7*   GFR: Estimated Creatinine Clearance: 13.1 mL/min (A) (by C-G formula based on SCr of 5.57 mg/dL (H)). Liver Function Tests: Recent Labs  Lab 09/23/18 1005 09/28/18 0354 09/29/18 0457 09/30/18 0540  AST 41*  --   --   --   ALT 18  --   --   --   BILITOT 0.6  --   --   --   PROT 6.8  --   --   --   ALBUMIN  --  1.7* 1.6* 1.7*   Recent Labs  Lab 09/23/18 1005  LIPASE 43   No results for input(s): AMMONIA in the last 168 hours. Coagulation Profile: No results for input(s): INR, PROTIME in the last 168 hours. Cardiac Enzymes: No results for input(s): CKTOTAL, CKMB, CKMBINDEX, TROPONINI in the last 168 hours. BNP (last 3 results) No results for input(s): PROBNP in the last 8760 hours. HbA1C: No results for input(s): HGBA1C in the last 72 hours. CBG: Recent Labs  Lab 09/29/18 0758 09/29/18 1107 09/29/18 1614 09/29/18 2056 09/30/18 0649  GLUCAP 112* 103* 128* 82 80   Lipid Profile: No results for input(s): CHOL, HDL, LDLCALC, TRIG, CHOLHDL, LDLDIRECT in the last 72 hours. Thyroid Function Tests: No results for input(s): TSH, T4TOTAL, FREET4, T3FREE, THYROIDAB in the last 72 hours. Anemia Panel: Recent Labs    09/28/18 1013  TIBC 220*  IRON 34*   Urine analysis:    Component Value Date/Time   COLORURINE AMBER (A) 09/27/2018 1440   APPEARANCEUR HAZY (A) 09/27/2018 1440   LABSPEC 1.012 09/27/2018 1440   PHURINE 5.0 09/27/2018 1440   GLUCOSEU NEGATIVE 09/27/2018 1440   HGBUR LARGE (A) 09/27/2018 1440   BILIRUBINUR NEGATIVE 09/27/2018 1440   KETONESUR NEGATIVE 09/27/2018 1440   PROTEINUR 100 (A) 09/27/2018 1440   NITRITE NEGATIVE 09/27/2018 1440   LEUKOCYTESUR NEGATIVE 09/27/2018 1440   Sepsis Labs: Invalid input(s): PROCALCITONIN, LACTICIDVEN  No results found for this or any previous visit (from the past 240 hour(s)).    Radiology Studies: No results found.  Marzetta Board, MD, PhD [facility]  Contact via   [email or web address]  [place] P: [phone]  F: [phone]

## 2018-10-01 LAB — GLUCOSE, CAPILLARY
Glucose-Capillary: 82 mg/dL (ref 70–99)
Glucose-Capillary: 118 mg/dL — ABNORMAL HIGH (ref 70–99)
Glucose-Capillary: 99 mg/dL (ref 70–99)
Glucose-Capillary: 95 mg/dL (ref 70–99)

## 2018-10-01 LAB — RENAL FUNCTION PANEL
Albumin: 1.7 g/dL — ABNORMAL LOW (ref 3.5–5.0)
Anion gap: 8 (ref 5–15)
BUN: 62 mg/dL — ABNORMAL HIGH (ref 8–23)
CO2: 19 mmol/L — ABNORMAL LOW (ref 22–32)
Calcium: 7.6 mg/dL — ABNORMAL LOW (ref 8.9–10.3)
Chloride: 108 mmol/L (ref 98–111)
Creatinine, Ser: 5.59 mg/dL — ABNORMAL HIGH (ref 0.61–1.24)
GFR calc Af Amer: 11 mL/min — ABNORMAL LOW (ref >60)
GFR calc non Af Amer: 9 mL/min — ABNORMAL LOW (ref >60)
Glucose, Bld: 86 mg/dL (ref 70–99)
Phosphorus: 5.9 mg/dL — ABNORMAL HIGH (ref 2.5–4.6)
Potassium: 3.5 mmol/L (ref 3.5–5.1)
Sodium: 135 mmol/L (ref 135–145)

## 2018-10-01 LAB — CREATININE, URINE, RANDOM: Creatinine, Urine: 73.47 mg/dL

## 2018-10-01 LAB — SODIUM, URINE, RANDOM: Sodium, Ur: 60 mmol/L

## 2018-10-01 MED ORDER — SODIUM BICARBONATE 650 MG PO TABS
1300.0000 mg | ORAL_TABLET | Freq: Two times a day (BID) | ORAL | Status: DC
  Administered 2018-10-01 – 2018-10-09 (×18): 1300 mg via ORAL
  Filled 2018-10-01 (×18): qty 2

## 2018-10-01 MED ORDER — PROMETHAZINE HCL 25 MG/ML IJ SOLN
12.5000 mg | Freq: Once | INTRAMUSCULAR | Status: AC
  Administered 2018-10-01: 12.5 mg via INTRAVENOUS
  Filled 2018-10-01: qty 1

## 2018-10-01 NOTE — Progress Notes (Signed)
Subjective: Interval History: not much appetite, breathing ok  Objective: Vital signs in last 24 hours: Temp:  [98.2 F (36.8 C)-98.6 F (37 C)] 98.3 F (36.8 C) (04/04 0418) Pulse Rate:  [77-89] 89 (04/04 0418) Resp:  [19-20] 20 (04/04 0418) BP: (139-153)/(79-90) 153/90 (04/04 0418) SpO2:  [94 %-98 %] 95 % (04/04 0418) Weight:  [86.7 kg] 86.7 kg (04/04 0418) Weight change: 0.328 kg  Intake/Output from previous day: 04/03 0701 - 04/04 0700 In: 637 [P.O.:520; IV Piggyback:117] Out: 727 [Urine:725; Stool:2] Intake/Output this shift: Total I/O In: -  Out: 402 [Urine:400; Stool:2]  General appearance: alert, cooperative and no distress Resp: diminished breath sounds bilaterally Cardio: S1, S2 normal and systolic murmur: systolic ejection 2/6, crescendo and decrescendo at 2nd left intercostal space GI: soft, liver down 5 cm Extremities: bruit in Ax-AX, Fem-fem. , 1+ edema  Lab Results: Recent Labs    09/29/18 0457  WBC 4.5  HGB 7.9*  HCT 24.4*  PLT 128*   BMET:  Recent Labs    09/30/18 0540 10/01/18 0407  NA 134* 135  K 4.0 3.5  CL 106 108  CO2 20* 19*  GLUCOSE 96 86  BUN 61* 62*  CREATININE 5.57* 5.59*  CALCIUM 7.5* 7.6*   No results for input(s): PTH in the last 72 hours. Iron Studies:  Recent Labs    09/28/18 1013  IRON 34*  TIBC 220*    Studies/Results: No results found.  I have reviewed the patient's current medications.  Assessment/Plan: 1 AKI hemodynamic, mild xs vol, acidemic, mild uremia.  No improvement, estab ATN 2 PVDstable 3 Anemia got Fe 4 DM 5 HTN mild 6 COPD 7 CAD P diet , follow chem.  Add Na bicarb.    LOS: 4 days   [name] 10/01/2018,6:57 AM

## 2018-10-01 NOTE — Progress Notes (Signed)
PROGRESS NOTE  [name]. IEP:[id number] DOB: 30-Dec-1945 DOA: 09/27/2018 PCP: [name], MD   LOS: 4 days   Brief Narrative / Interim history: 73 year old male with history of diabetes mellitus, hypertension, COPD, history of bladder cancer was sent to the hospital from his primary doctor's office for worsening renal failure.  He also reports that he has had a bout of diarrhea for the last 3 weeks that has resolved.  Urinalysis on admission showed 3+ proteinuria as well as microscopic hematuria.  Subjective: -no complaints.   Assessment & Plan: Active Problems:   Acute renal failure (ARF) ([name])   Principal Problem Acute kidney injury -Combination of dehydration from GI losses, losartan, hydrochlorothiazide.  He was started on IV fluids on admission but these are now being on hold. -Nephrology consulted, appreciate input -Renal ultrasound without hydronephrosis or acute findings -management per nephrology   Active Problems History of bladder cancer -Urinalysis showed microscopic hematuria, he has not seen his urologist in 2 years I discussed with him and strongly recommend that he gets a follow-up appointment.    Type 2 diabetes mellitus -CBGs well controlled, keep on sliding scale  Hypertension -Continue metoprolol, blood pressure stable  Coronary artery disease -Stable, continue Lipitor and Plavix.  No chest pain  Hyperlipidemia -Continue home medications  Scheduled Meds: . atorvastatin  80 mg Oral QHS  . clopidogrel  75 mg Oral Daily  . doxazosin  4 mg Oral Daily  . ezetimibe  10 mg Oral Daily  . feeding supplement (GLUCERNA SHAKE)  237 mL Oral BID  . fenofibrate  160 mg Oral Daily  . insulin aspart  0-5 Units Subcutaneous QHS  . insulin aspart  0-9 Units Subcutaneous TID WC  . loratadine  10 mg Oral QHS  . metoprolol succinate  25 mg Oral Daily  . nicotine  21 mg Transdermal QHS  . sodium bicarbonate  1,300 mg Oral BID   Continuous Infusions:   PRN Meds:.EPINEPHrine, hydrALAZINE, ondansetron **OR** ondansetron (ZOFRAN) IV, oxyCODONE-acetaminophen **AND** oxyCODONE  DVT prophylaxis: heparin Code Status: Full code Family Communication: no family at bedside Disposition Plan: home when ready   Consultants:   Nephrology   Procedures:   None   Antimicrobials:  None    Objective: Vitals:   09/30/18 1620 09/30/18 2102 10/01/18 0418 10/01/18 0856  BP: 139/79 (!) 151/90 (!) 153/90 137/78  Pulse: 79 77 89 90  Resp: 19 20 20 19   Temp: 98.2 F (36.8 C) 98.6 F (37 C) 98.3 F (36.8 C) 98.4 F (36.9 C)  TempSrc: Oral Oral Oral Oral  SpO2: 98% 94% 95% 93%  Weight:   86.7 kg   Height:        Intake/Output Summary (Last 24 hours) at 10/01/2018 1114 Last data filed at 10/01/2018 6606 Gross per 24 hour  Intake 337 ml  Output 852 ml  Net -515 ml   Filed Weights   09/28/18 1946 09/29/18 2020 10/01/18 0418  Weight: 86.2 kg 86.4 kg 86.7 kg    Examination:  Constitutional: NAD Respiratory: CTA biL Cardiovascular: RRR  Data Reviewed: I have independently reviewed following labs and imaging studies   CBC: Recent Labs  Lab 09/27/18 1255 09/29/18 0457  WBC 4.7 4.5  NEUTROABS 2.8  --   HGB 9.5* 7.9*  HCT 29.7* 24.4*  MCV 88.1 85.9  PLT 137* 301*   Basic Metabolic Panel: Recent Labs  Lab 09/27/18 1255 09/28/18 0354 09/29/18 0457 09/30/18 0540 10/01/18 0407  NA 135 137 138 134* 135  K 3.9 3.7 3.8 4.0 3.5  CL 105 110 111 106 108  CO2 21* 19* 21* 20* 19*  GLUCOSE 111* 97 105* 96 86  BUN 68* 64* 63* 61* 62*  CREATININE 5.76* 5.35* 5.51* 5.57* 5.59*  CALCIUM 7.8* 7.6* 7.6* 7.5* 7.6*  PHOS  --  5.9* 5.8* 5.7* 5.9*   GFR: Estimated Creatinine Clearance: 12 mL/min (A) (by C-G formula based on SCr of 5.59 mg/dL (H)). Liver Function Tests: Recent Labs  Lab 09/26/18 1256 09/28/18 0354 09/29/18 0457 09/30/18 0540 10/01/18 0407  PROT 6.5  --   --   --   --   ALBUMIN  --  1.7* 1.6* 1.7* 1.7*   No results  for input(s): LIPASE, AMYLASE in the last 168 hours. No results for input(s): AMMONIA in the last 168 hours. Coagulation Profile: No results for input(s): INR, PROTIME in the last 168 hours. Cardiac Enzymes: No results for input(s): CKTOTAL, CKMB, CKMBINDEX, TROPONINI in the last 168 hours. BNP (last 3 results) No results for input(s): PROBNP in the last 8760 hours. HbA1C: No results for input(s): HGBA1C in the last 72 hours. CBG: Recent Labs  Lab 09/30/18 0649 09/30/18 1116 09/30/18 1615 09/30/18 2106 10/01/18 0652  GLUCAP 80 97 104* 84 82   Lipid Profile: No results for input(s): CHOL, HDL, LDLCALC, TRIG, CHOLHDL, LDLDIRECT in the last 72 hours. Thyroid Function Tests: No results for input(s): TSH, T4TOTAL, FREET4, T3FREE, THYROIDAB in the last 72 hours. Anemia Panel: No results for input(s): VITAMINB12, FOLATE, FERRITIN, TIBC, IRON, RETICCTPCT in the last 72 hours. Urine analysis:    Component Value Date/Time   COLORURINE AMBER (A) 09/27/2018 1440   APPEARANCEUR HAZY (A) 09/27/2018 1440   LABSPEC 1.012 09/27/2018 1440   PHURINE 5.0 09/27/2018 1440   GLUCOSEU NEGATIVE 09/27/2018 1440   HGBUR LARGE (A) 09/27/2018 1440   BILIRUBINUR NEGATIVE 09/27/2018 1440   KETONESUR NEGATIVE 09/27/2018 1440   PROTEINUR 100 (A) 09/27/2018 1440   NITRITE NEGATIVE 09/27/2018 1440   LEUKOCYTESUR NEGATIVE 09/27/2018 1440   Sepsis Labs: Invalid input(s): PROCALCITONIN, LACTICIDVEN  No results found for this or any previous visit (from the past 240 hour(s)).    Radiology Studies: No results found.  Marzetta Board, MD, PhD [facility]  Contact via  [email or web address]  [place] P: [phone]  F: [phone]

## 2018-10-02 LAB — CBC
HCT: 26.7 % — ABNORMAL LOW (ref 39.0–52.0)
Hemoglobin: 8.8 g/dL — ABNORMAL LOW (ref 13.0–17.0)
MCH: 28.1 pg (ref 26.0–34.0)
MCHC: 33 g/dL (ref 30.0–36.0)
MCV: 85.3 fL (ref 80.0–100.0)
Platelets: 125 10*3/uL — ABNORMAL LOW (ref 150–400)
RBC: 3.13 MIL/uL — ABNORMAL LOW (ref 4.22–5.81)
RDW: 17.3 % — ABNORMAL HIGH (ref 11.5–15.5)
WBC: 4.6 10*3/uL (ref 4.0–10.5)
nRBC: 0 % (ref 0.0–0.2)

## 2018-10-02 LAB — GLUCOSE, CAPILLARY
Glucose-Capillary: 104 mg/dL — ABNORMAL HIGH (ref 70–99)
Glucose-Capillary: 111 mg/dL — ABNORMAL HIGH (ref 70–99)
Glucose-Capillary: 121 mg/dL — ABNORMAL HIGH (ref 70–99)
Glucose-Capillary: 84 mg/dL (ref 70–99)
Glucose-Capillary: 94 mg/dL (ref 70–99)

## 2018-10-02 LAB — RENAL FUNCTION PANEL
Albumin: 1.7 g/dL — ABNORMAL LOW (ref 3.5–5.0)
Anion gap: 10 (ref 5–15)
BUN: 64 mg/dL — ABNORMAL HIGH (ref 8–23)
CO2: 21 mmol/L — ABNORMAL LOW (ref 22–32)
Calcium: 7.8 mg/dL — ABNORMAL LOW (ref 8.9–10.3)
Chloride: 104 mmol/L (ref 98–111)
Creatinine, Ser: 5.59 mg/dL — ABNORMAL HIGH (ref 0.61–1.24)
GFR calc Af Amer: 11 mL/min — ABNORMAL LOW (ref >60)
GFR calc non Af Amer: 9 mL/min — ABNORMAL LOW (ref >60)
Glucose, Bld: 94 mg/dL (ref 70–99)
Phosphorus: 5.4 mg/dL — ABNORMAL HIGH (ref 2.5–4.6)
Potassium: 3.8 mmol/L (ref 3.5–5.1)
Sodium: 135 mmol/L (ref 135–145)

## 2018-10-02 MED ORDER — SODIUM CHLORIDE 0.9 % IV SOLN
INTRAVENOUS | Status: DC
  Administered 2018-10-02 (×2): via INTRAVENOUS

## 2018-10-02 NOTE — Progress Notes (Signed)
Subjective: Interval History: has complaint. Want to get up and go to BR and to void.  Objective: Vital signs in last 24 hours: Temp:  [97.7 F (36.5 C)-98.3 F (36.8 C)] 97.9 F (36.6 C) (04/05 0844) Pulse Rate:  [83-92] 87 (04/05 0844) Resp:  [17-21] 19 (04/05 0844) BP: (135-173)/(76-83) 135/79 (04/05 0844) SpO2:  [94 %-97 %] 96 % (04/05 0844) Weight:  [86.7 kg] 86.7 kg (04/04 2118) Weight change: -0.001 kg  Intake/Output from previous day: 04/04 0701 - 04/05 0700 In: 460.8 [P.O.:460; I.V.:0.8] Out: 825 [Urine:825] Intake/Output this shift: Total I/O In: 180 [P.O.:180] Out: 100 [Urine:100]  General appearance: alert, cooperative and no distress Resp: diminished breath sounds bilaterally Cardio: S1, S2 normal and systolic murmur: systolic ejection 2/6, crescendo and decrescendo at 2nd left intercostal space GI: soft, liver down 5 cm, nontender Extremities: Ax-AX, F-Fem bruits.  2 + edema.  Lab Results: Recent Labs    10/02/18 0618  WBC 4.6  HGB 8.8*  HCT 26.7*  PLT 125*   BMET:  Recent Labs    10/01/18 0407 10/02/18 0618  NA 135 135  K 3.5 3.8  CL 108 104  CO2 19* 21*  GLUCOSE 86 94  BUN 62* 64*  CREATININE 5.59* 5.59*  CALCIUM 7.6* 7.8*   No results for input(s): PTH in the last 72 hours. Iron Studies: No results for input(s): IRON, TIBC, TRANSFERRIN, FERRITIN in the last 72 hours.  Studies/Results: No results found.  I have reviewed the patient's current medications.  Assessment/Plan: 1 AKI/CKD3 midl vol xs, acidemia better.  No change GFR.  Established aTN, may take a while if recovers.  Can d/c in 1-2 d if not better or if better. 2 Anemia stable 3 COPD/smoking 4 PVD 5 CAD 6Hematuria/bladder Ca P follow chem , urine vol, Urology outpatient     LOS: 5 days   Jeneen Rinks Coleby Yett 10/02/2018,9:27 AM

## 2018-10-02 NOTE — Progress Notes (Signed)
PROGRESS NOTE  [name]. TZG:[id number] DOB: 01/10/1946 DOA: 09/27/2018 PCP: [name], MD   LOS: 5 days   Brief Narrative / Interim history: 73 year old male with history of diabetes mellitus, hypertension, COPD, history of bladder cancer was sent to the hospital from his primary doctor's office for worsening renal failure.  He also reports that he has had a bout of diarrhea for the last 3 weeks that has resolved.  Urinalysis on admission showed 3+ proteinuria as well as microscopic hematuria.  Subjective: -No complaints, no nausea or vomiting  Assessment & Plan: Active Problems:   Acute renal failure (ARF) ([name])   Principal Problem Acute kidney injury -Combination of dehydration from GI losses, losartan, hydrochlorothiazide.  He was started on IV fluids on admission but these are now being on hold. -Nephrology consulted, appreciate input -Renal ultrasound without hydronephrosis or acute findings -management per nephrology   Active Problems History of bladder cancer -Urinalysis showed microscopic hematuria, he has not seen his urologist in 2 years I discussed with him and strongly recommend that he gets a follow-up appointment.    Type 2 diabetes mellitus -CBGs well controlled, keep on sliding scale  Hypertension -Continue metoprolol, blood pressure stable  Coronary artery disease -Stable, continue Lipitor and Plavix.  No chest pain  Hyperlipidemia -Continue home medications  Amenia of chronic kidney disease -Hb stable, no bleeding  Scheduled Meds:  atorvastatin  80 mg Oral QHS   clopidogrel  75 mg Oral Daily   doxazosin  4 mg Oral Daily   ezetimibe  10 mg Oral Daily   feeding supplement (GLUCERNA SHAKE)  237 mL Oral BID   fenofibrate  160 mg Oral Daily   insulin aspart  0-5 Units Subcutaneous QHS   insulin aspart  0-9 Units Subcutaneous TID WC   loratadine  10 mg Oral QHS   metoprolol succinate  25 mg Oral Daily   nicotine  21 mg  Transdermal QHS   sodium bicarbonate  1,300 mg Oral BID   Continuous Infusions:  sodium chloride 50 mL/hr at 10/02/18 0528   PRN Meds:.EPINEPHrine, hydrALAZINE, ondansetron **OR** ondansetron (ZOFRAN) IV, oxyCODONE-acetaminophen **AND** oxyCODONE  DVT prophylaxis: heparin Code Status: Full code Family Communication: no family at bedside Disposition Plan: home when ready   Consultants:   Nephrology   Procedures:   None   Antimicrobials:  None    Objective: Vitals:   10/01/18 1608 10/01/18 2118 10/02/18 0522 10/02/18 0844  BP: (!) 173/81 (!) 157/83 (!) 142/76 135/79  Pulse: 83 87 92 87  Resp: 20 (!) 21 17 19   Temp: 98.3 F (36.8 C) 97.9 F (36.6 C) 97.7 F (36.5 C) 97.9 F (36.6 C)  TempSrc: Oral Oral Oral Oral  SpO2: 97% 94% 95% 96%  Weight:  86.7 kg    Height:        Intake/Output Summary (Last 24 hours) at 10/02/2018 0954 Last data filed at 10/02/2018 0843 Gross per 24 hour  Intake 640.83 ml  Output 925 ml  Net -284.17 ml   Filed Weights   09/29/18 2020 10/01/18 0418 10/01/18 2118  Weight: 86.4 kg 86.7 kg 86.7 kg    Examination:  Constitutional: NAD Respiratory: CTA Cardiovascular: RRR  Data Reviewed: I have independently reviewed following labs and imaging studies   CBC: Recent Labs  Lab 09/27/18 1255 09/29/18 0457 10/02/18 0618  WBC 4.7 4.5 4.6  NEUTROABS 2.8  --   --   HGB 9.5* 7.9* 8.8*  HCT 29.7* 24.4* 26.7*  MCV 88.1  85.9 85.3  PLT 137* 128* 614*   Basic Metabolic Panel: Recent Labs  Lab 09/28/18 0354 09/29/18 0457 09/30/18 0540 10/01/18 0407 10/02/18 0618  NA 137 138 134* 135 135  K 3.7 3.8 4.0 3.5 3.8  CL 110 111 106 108 104  CO2 19* 21* 20* 19* 21*  GLUCOSE 97 105* 96 86 94  BUN 64* 63* 61* 62* 64*  CREATININE 5.35* 5.51* 5.57* 5.59* 5.59*  CALCIUM 7.6* 7.6* 7.5* 7.6* 7.8*  PHOS 5.9* 5.8* 5.7* 5.9* 5.4*   GFR: Estimated Creatinine Clearance: 12 mL/min (A) (by C-G formula based on SCr of 5.59 mg/dL (H)). Liver  Function Tests: Recent Labs  Lab 09/26/18 1256 09/28/18 0354 09/29/18 0457 09/30/18 0540 10/01/18 0407 10/02/18 0618  PROT 6.5  --   --   --   --   --   ALBUMIN  --  1.7* 1.6* 1.7* 1.7* 1.7*   No results for input(s): LIPASE, AMYLASE in the last 168 hours. No results for input(s): AMMONIA in the last 168 hours. Coagulation Profile: No results for input(s): INR, PROTIME in the last 168 hours. Cardiac Enzymes: No results for input(s): CKTOTAL, CKMB, CKMBINDEX, TROPONINI in the last 168 hours. BNP (last 3 results) No results for input(s): PROBNP in the last 8760 hours. HbA1C: No results for input(s): HGBA1C in the last 72 hours. CBG: Recent Labs  Lab 10/01/18 1115 10/01/18 1608 10/01/18 2119 10/02/18 0015 10/02/18 0825  GLUCAP 118* 99 95 84 121*   Lipid Profile: No results for input(s): CHOL, HDL, LDLCALC, TRIG, CHOLHDL, LDLDIRECT in the last 72 hours. Thyroid Function Tests: No results for input(s): TSH, T4TOTAL, FREET4, T3FREE, THYROIDAB in the last 72 hours. Anemia Panel: No results for input(s): VITAMINB12, FOLATE, FERRITIN, TIBC, IRON, RETICCTPCT in the last 72 hours. Urine analysis:    Component Value Date/Time   COLORURINE AMBER (A) 09/27/2018 1440   APPEARANCEUR HAZY (A) 09/27/2018 1440   LABSPEC 1.012 09/27/2018 1440   PHURINE 5.0 09/27/2018 1440   GLUCOSEU NEGATIVE 09/27/2018 1440   HGBUR LARGE (A) 09/27/2018 1440   BILIRUBINUR NEGATIVE 09/27/2018 1440   KETONESUR NEGATIVE 09/27/2018 1440   PROTEINUR 100 (A) 09/27/2018 1440   NITRITE NEGATIVE 09/27/2018 1440   LEUKOCYTESUR NEGATIVE 09/27/2018 1440   Sepsis Labs: Invalid input(s): PROCALCITONIN, LACTICIDVEN  No results found for this or any previous visit (from the past 240 hour(s)).    Radiology Studies: No results found.  Marzetta Board, MD, PhD [facility]  Contact via  [email or web address]  [place] P: [phone]  F: [phone]

## 2018-10-03 ENCOUNTER — Other Ambulatory Visit: Payer: Self-pay | Admitting: *Deleted

## 2018-10-03 LAB — GLUCOSE, CAPILLARY
Glucose-Capillary: 87 mg/dL (ref 70–99)
Glucose-Capillary: 95 mg/dL (ref 70–99)
Glucose-Capillary: 97 mg/dL (ref 70–99)
Glucose-Capillary: 90 mg/dL (ref 70–99)

## 2018-10-03 LAB — RENAL FUNCTION PANEL
Albumin: 1.6 g/dL — ABNORMAL LOW (ref 3.5–5.0)
Anion gap: 9 (ref 5–15)
BUN: 64 mg/dL — ABNORMAL HIGH (ref 8–23)
CO2: 19 mmol/L — ABNORMAL LOW (ref 22–32)
Calcium: 7.5 mg/dL — ABNORMAL LOW (ref 8.9–10.3)
Chloride: 106 mmol/L (ref 98–111)
Creatinine, Ser: 5.63 mg/dL — ABNORMAL HIGH (ref 0.61–1.24)
GFR calc Af Amer: 11 mL/min — ABNORMAL LOW (ref >60)
GFR calc non Af Amer: 9 mL/min — ABNORMAL LOW (ref >60)
Glucose, Bld: 91 mg/dL (ref 70–99)
Phosphorus: 5.3 mg/dL — ABNORMAL HIGH (ref 2.5–4.6)
Potassium: 3.5 mmol/L (ref 3.5–5.1)
Sodium: 134 mmol/L — ABNORMAL LOW (ref 135–145)

## 2018-10-03 MED ORDER — CLOPIDOGREL BISULFATE 75 MG PO TABS: 75.0000 mg | ORAL_TABLET | Freq: Every day | ORAL | 3 refills | Status: DC

## 2018-10-03 MED ORDER — FENOFIBRATE 160 MG PO TABS: 160.0000 mg | ORAL_TABLET | Freq: Every day | ORAL | 3 refills | Status: DC

## 2018-10-03 NOTE — Progress Notes (Signed)
  [facility] Progress Note    Assessment/ Plan:   1 AKI/CKD3: d/t ATN- GI losses, losartan, HCTZ.  Baseline cr 1.01. Nonoliguric.  On Bicarb.  Cr is essentially stable at 5.5.   2 Anemia stable 3 COPD/smoking 4 PVD 5 CAD 6Hematuria/bladder Ca    Feeling a little nauseated today.  Making urine.  No other complaints.     Objective:   BP 140/88 (BP Location: Left Arm)   Pulse 80   Temp 98.1 F (36.7 C) (Oral)   Resp 18   Ht 5\' 10"  (1.778 m)   Wt 86.7 kg   SpO2 97%   BMI 27.43 kg/m   Intake/Output Summary (Last 24 hours) at 10/03/2018 1308 Last data filed at 10/03/2018 1058 Gross per 24 hour  Intake 1740.01 ml  Output 950 ml  Net 790.01 ml   Weight change: -0.007 kg  Physical Exam: Gen: older gentleman, NAD CVS: RRR, no m/r/g Resp: clear bilaterally no c/w/r Abd: hyperactive BS Ext: no LE edema  Imaging: No results found.  Labs: BMET Recent Labs  Lab 09/27/18 1255 09/28/18 0354 09/29/18 0457 09/30/18 0540 10/01/18 0407 10/02/18 0618 10/03/18 0531  NA 135 137 138 134* 135 135 134*  K 3.9 3.7 3.8 4.0 3.5 3.8 3.5  CL 105 110 111 106 108 104 106  CO2 21* 19* 21* 20* 19* 21* 19*  GLUCOSE 111* 97 105* 96 86 94 91  BUN 68* 64* 63* 61* 62* 64* 64*  CREATININE 5.76* 5.35* 5.51* 5.57* 5.59* 5.59* 5.63*  CALCIUM 7.8* 7.6* 7.6* 7.5* 7.6* 7.8* 7.5*  PHOS  --  5.9* 5.8* 5.7* 5.9* 5.4* 5.3*   CBC Recent Labs  Lab 09/27/18 1255 09/29/18 0457 10/02/18 0618  WBC 4.7 4.5 4.6  NEUTROABS 2.8  --   --   HGB 9.5* 7.9* 8.8*  HCT 29.7* 24.4* 26.7*  MCV 88.1 85.9 85.3  PLT 137* 128* 125*    Medications:    . atorvastatin  80 mg Oral QHS  . clopidogrel  75 mg Oral Daily  . doxazosin  4 mg Oral Daily  . ezetimibe  10 mg Oral Daily  . feeding supplement (GLUCERNA SHAKE)  237 mL Oral BID  . fenofibrate  160 mg Oral Daily  . insulin aspart  0-5 Units Subcutaneous QHS  . insulin aspart  0-9 Units Subcutaneous TID WC  . loratadine  10 mg Oral QHS  .  metoprolol succinate  25 mg Oral Daily  . nicotine  21 mg Transdermal QHS  . sodium bicarbonate  1,300 mg Oral BID      Madelon Lips, MD [place] [phone] 10/03/2018, 1:08 PM

## 2018-10-03 NOTE — Progress Notes (Signed)
PROGRESS NOTE  [name]. VCB:[id number] DOB: Nov 02, 1945 DOA: 09/27/2018 PCP: [name], MD   LOS: 6 days   Brief Narrative / Interim history: 73 year old male with history of diabetes mellitus, hypertension, COPD, history of bladder cancer was sent to the hospital from his primary doctor's office for worsening renal failure.  He also reports that he has had a bout of diarrhea for the last 3 weeks that has resolved.  Urinalysis on admission showed 3+ proteinuria as well as microscopic hematuria.  Subjective: -No complaints, no nausea or vomiting  Assessment & Plan: Active Problems:   Acute renal failure (ARF) ([name])   Principal Problem Acute kidney injury -Combination of dehydration from GI losses, losartan, hydrochlorothiazide.  He was started on IV fluids on admission but these are now being on hold. -Nephrology consulted, appreciate input -Renal ultrasound without hydronephrosis or acute findings -Management per nephrology  Active Problems History of bladder cancer -Urinalysis showed microscopic hematuria, he has not seen his urologist in 2 years I discussed with him and strongly recommend that he gets a follow-up appointment.    Type 2 diabetes mellitus -CBGs well controlled, keep on sliding scale  Hypertension -Continue metoprolol, blood pressure stable  Coronary artery disease -Stable, continue Lipitor and Plavix.  No chest pain  Hyperlipidemia -Continue home medications  Amenia of chronic kidney disease -Hb stable, no bleeding  Scheduled Meds: . atorvastatin  80 mg Oral QHS  . clopidogrel  75 mg Oral Daily  . doxazosin  4 mg Oral Daily  . ezetimibe  10 mg Oral Daily  . feeding supplement (GLUCERNA SHAKE)  237 mL Oral BID  . fenofibrate  160 mg Oral Daily  . insulin aspart  0-5 Units Subcutaneous QHS  . insulin aspart  0-9 Units Subcutaneous TID WC  . loratadine  10 mg Oral QHS  . metoprolol succinate  25 mg Oral Daily  . nicotine  21 mg  Transdermal QHS  . sodium bicarbonate  1,300 mg Oral BID   Continuous Infusions: . sodium chloride 50 mL/hr at 10/02/18 2326   PRN Meds:.EPINEPHrine, hydrALAZINE, ondansetron **OR** ondansetron (ZOFRAN) IV, oxyCODONE-acetaminophen **AND** oxyCODONE  DVT prophylaxis: heparin Code Status: Full code Family Communication: no family at bedside Disposition Plan: home when ready   Consultants:   Nephrology   Procedures:   None   Antimicrobials:  None    Objective: Vitals:   10/02/18 0844 10/02/18 1619 10/02/18 2140 10/03/18 0936  BP: 135/79 (!) 131/93 (!) 161/86 140/88  Pulse: 87 87 88 80  Resp: 19 18 17 18   Temp: 97.9 F (36.6 C) 98 F (36.7 C) 98.5 F (36.9 C) 98.1 F (36.7 C)  TempSrc: Oral Oral Oral Oral  SpO2: 96% 96% 96% 97%  Weight:   86.7 kg   Height:        Intake/Output Summary (Last 24 hours) at 10/03/2018 1244 Last data filed at 10/03/2018 1058 Gross per 24 hour  Intake 1740.01 ml  Output 950 ml  Net 790.01 ml   Filed Weights   10/01/18 0418 10/01/18 2118 10/02/18 2140  Weight: 86.7 kg 86.7 kg 86.7 kg    Examination:  Constitutional: NAD Respiratory: CTA Cardiovascular: RRR  Data Reviewed: I have independently reviewed following labs and imaging studies   CBC: Recent Labs  Lab 09/27/18 1255 09/29/18 0457 10/02/18 0618  WBC 4.7 4.5 4.6  NEUTROABS 2.8  --   --   HGB 9.5* 7.9* 8.8*  HCT 29.7* 24.4* 26.7*  MCV 88.1 85.9 85.3  PLT 137* 128* 160*   Basic Metabolic Panel: Recent Labs  Lab 09/29/18 0457 09/30/18 0540 10/01/18 0407 10/02/18 0618 10/03/18 0531  NA 138 134* 135 135 134*  K 3.8 4.0 3.5 3.8 3.5  CL 111 106 108 104 106  CO2 21* 20* 19* 21* 19*  GLUCOSE 105* 96 86 94 91  BUN 63* 61* 62* 64* 64*  CREATININE 5.51* 5.57* 5.59* 5.59* 5.63*  CALCIUM 7.6* 7.5* 7.6* 7.8* 7.5*  PHOS 5.8* 5.7* 5.9* 5.4* 5.3*   GFR: Estimated Creatinine Clearance: 11.9 mL/min (A) (by C-G formula based on SCr of 5.63 mg/dL (H)). Liver Function  Tests: Recent Labs  Lab 09/26/18 1256  09/29/18 0457 09/30/18 0540 10/01/18 0407 10/02/18 0618 10/03/18 0531  PROT 6.5  --   --   --   --   --   --   ALBUMIN  --    < > 1.6* 1.7* 1.7* 1.7* 1.6*   < > = values in this interval not displayed.   No results for input(s): LIPASE, AMYLASE in the last 168 hours. No results for input(s): AMMONIA in the last 168 hours. Coagulation Profile: No results for input(s): INR, PROTIME in the last 168 hours. Cardiac Enzymes: No results for input(s): CKTOTAL, CKMB, CKMBINDEX, TROPONINI in the last 168 hours. BNP (last 3 results) No results for input(s): PROBNP in the last 8760 hours. HbA1C: No results for input(s): HGBA1C in the last 72 hours. CBG: Recent Labs  Lab 10/02/18 1111 10/02/18 1619 10/02/18 2140 10/03/18 0701 10/03/18 1139  GLUCAP 111* 104* 94 87 95   Lipid Profile: No results for input(s): CHOL, HDL, LDLCALC, TRIG, CHOLHDL, LDLDIRECT in the last 72 hours. Thyroid Function Tests: No results for input(s): TSH, T4TOTAL, FREET4, T3FREE, THYROIDAB in the last 72 hours. Anemia Panel: No results for input(s): VITAMINB12, FOLATE, FERRITIN, TIBC, IRON, RETICCTPCT in the last 72 hours. Urine analysis:    Component Value Date/Time   COLORURINE AMBER (A) 09/27/2018 1440   APPEARANCEUR HAZY (A) 09/27/2018 1440   LABSPEC 1.012 09/27/2018 1440   PHURINE 5.0 09/27/2018 1440   GLUCOSEU NEGATIVE 09/27/2018 1440   HGBUR LARGE (A) 09/27/2018 1440   BILIRUBINUR NEGATIVE 09/27/2018 1440   KETONESUR NEGATIVE 09/27/2018 1440   PROTEINUR 100 (A) 09/27/2018 1440   NITRITE NEGATIVE 09/27/2018 1440   LEUKOCYTESUR NEGATIVE 09/27/2018 1440   Sepsis Labs: Invalid input(s): PROCALCITONIN, LACTICIDVEN  No results found for this or any previous visit (from the past 240 hour(s)).    Radiology Studies: No results found.  Marzetta Board, MD, PhD [facility]  Contact via  [email or web address]  [place] P: [phone]  F:  [phone]

## 2018-10-04 LAB — RENAL FUNCTION PANEL
Albumin: 1.7 g/dL — ABNORMAL LOW (ref 3.5–5.0)
Anion gap: 10 (ref 5–15)
BUN: 67 mg/dL — ABNORMAL HIGH (ref 8–23)
CO2: 22 mmol/L (ref 22–32)
Calcium: 7.9 mg/dL — ABNORMAL LOW (ref 8.9–10.3)
Chloride: 106 mmol/L (ref 98–111)
Creatinine, Ser: 6.01 mg/dL — ABNORMAL HIGH (ref 0.61–1.24)
GFR calc Af Amer: 10 mL/min — ABNORMAL LOW (ref >60)
GFR calc non Af Amer: 8 mL/min — ABNORMAL LOW (ref >60)
Glucose, Bld: 90 mg/dL (ref 70–99)
Phosphorus: 5.9 mg/dL — ABNORMAL HIGH (ref 2.5–4.6)
Potassium: 3.8 mmol/L (ref 3.5–5.1)
Sodium: 138 mmol/L (ref 135–145)

## 2018-10-04 LAB — URINALYSIS, ROUTINE W REFLEX MICROSCOPIC
Bilirubin Urine: NEGATIVE
Glucose, UA: NEGATIVE mg/dL
Ketones, ur: NEGATIVE mg/dL
Leukocytes,Ua: NEGATIVE
Nitrite: NEGATIVE
Protein, ur: 100 mg/dL — AB
RBC / HPF: >50 RBC/hpf — ABNORMAL HIGH (ref 0–5)
Specific Gravity, Urine: 1.01 (ref 1.005–1.030)
pH: 5 (ref 5.0–8.0)

## 2018-10-04 LAB — GLUCOSE, CAPILLARY
Glucose-Capillary: 105 mg/dL — ABNORMAL HIGH (ref 70–99)
Glucose-Capillary: 90 mg/dL (ref 70–99)
Glucose-Capillary: 107 mg/dL — ABNORMAL HIGH (ref 70–99)
Glucose-Capillary: 107 mg/dL — ABNORMAL HIGH (ref 70–99)

## 2018-10-04 NOTE — Progress Notes (Signed)
PROGRESS NOTE  [name]. YBO:[id number] DOB: [birth year] DOA: 09/27/2018 PCP: [name], MD   LOS: 7 days   Brief Narrative / Interim history: 73 year old male with history of diabetes mellitus, hypertension, COPD, history of bladder cancer was sent to the hospital from his primary doctor's office for worsening renal failure.  He also reports that he has had a bout of diarrhea for the last 3 weeks that has resolved.  Urinalysis on admission showed 3+ proteinuria as well as microscopic hematuria.  Nephrology consulted.  Subjective: -No chest pain, no shortness of breath.  Overall feels well.  Assessment & Plan: Active Problems:   Acute renal failure (ARF) ([name])   Principal Problem Acute kidney injury -Combination of dehydration from GI losses, losartan, hydrochlorothiazide.  Nephrology was consulted and following patient.  He was initially started on IV fluids, but now these are on hold.  His creatinine has overall remained stable but slightly increasing in the last couple of days, per nephrology we will continue to monitor and if he has good p.o. intake and creatinine no longer worsens may be able to discharge in 1 to 2 days and to have outpatient follow-up in their office -Renal ultrasound without hydronephrosis or acute findings  Active Problems History of bladder cancer -Urinalysis showed microscopic hematuria, he has not seen his urologist in 2 years I discussed with him and strongly recommend that he gets a follow-up appointment.    Type 2 diabetes mellitus -CBGs well controlled, keep on sliding scale  Hypertension -Continue metoprolol, blood pressure stable  Coronary artery disease -Stable, continue Lipitor and Plavix.  No chest pain  Hyperlipidemia -Continue home medications  Amenia of chronic kidney disease -Hb stable, no bleeding  Scheduled Meds: . atorvastatin  80 mg Oral QHS  . clopidogrel  75 mg Oral Daily  . doxazosin  4 mg Oral Daily  .  ezetimibe  10 mg Oral Daily  . feeding supplement (GLUCERNA SHAKE)  237 mL Oral BID  . fenofibrate  160 mg Oral Daily  . insulin aspart  0-5 Units Subcutaneous QHS  . insulin aspart  0-9 Units Subcutaneous TID WC  . loratadine  10 mg Oral QHS  . metoprolol succinate  25 mg Oral Daily  . nicotine  21 mg Transdermal QHS  . sodium bicarbonate  1,300 mg Oral BID   Continuous Infusions:  PRN Meds:.EPINEPHrine, hydrALAZINE, ondansetron **OR** ondansetron (ZOFRAN) IV, oxyCODONE-acetaminophen **AND** oxyCODONE  DVT prophylaxis: heparin Code Status: Full code Family Communication: no family at bedside Disposition Plan: home when ready   Consultants:   Nephrology   Procedures:   None   Antimicrobials:  None    Objective: Vitals:   10/03/18 2104 10/03/18 2211 10/04/18 0539 10/04/18 1012  BP: (!) 141/65  (!) 175/84 132/80  Pulse: 83  96 81  Resp: 18  20 20   Temp: 98.4 F (36.9 C)  97.9 F (36.6 C) 98 F (36.7 C)  TempSrc: Oral  Oral Oral  SpO2: 98%  98% 97%  Weight:  86.7 kg    Height:        Intake/Output Summary (Last 24 hours) at 10/04/2018 1256 Last data filed at 10/04/2018 0915 Gross per 24 hour  Intake 1140 ml  Output 1026 ml  Net 114 ml   Filed Weights   10/01/18 2118 10/02/18 2140 10/03/18 2211  Weight: 86.7 kg 86.7 kg 86.7 kg    Examination:  Constitutional: NAD Respiratory: CTA Cardiovascular: RRR  Data Reviewed: I have independently reviewed following  labs and imaging studies   CBC: Recent Labs  Lab 09/29/18 0457 10/02/18 0618  WBC 4.5 4.6  HGB 7.9* 8.8*  HCT 24.4* 26.7*  MCV 85.9 85.3  PLT 128* 833*   Basic Metabolic Panel: Recent Labs  Lab 09/30/18 0540 10/01/18 0407 10/02/18 0618 10/03/18 0531 10/04/18 0602  NA 134* 135 135 134* 138  K 4.0 3.5 3.8 3.5 3.8  CL 106 108 104 106 106  CO2 20* 19* 21* 19* 22  GLUCOSE 96 86 94 91 90  BUN 61* 62* 64* 64* 67*  CREATININE 5.57* 5.59* 5.59* 5.63* 6.01*  CALCIUM 7.5* 7.6* 7.8* 7.5* 7.9*   PHOS 5.7* 5.9* 5.4* 5.3* 5.9*   GFR: Estimated Creatinine Clearance: 11.1 mL/min (A) (by C-G formula based on SCr of 6.01 mg/dL (H)). Liver Function Tests: Recent Labs  Lab 09/30/18 0540 10/01/18 0407 10/02/18 0618 10/03/18 0531 10/04/18 0602  ALBUMIN 1.7* 1.7* 1.7* 1.6* 1.7*   No results for input(s): LIPASE, AMYLASE in the last 168 hours. No results for input(s): AMMONIA in the last 168 hours. Coagulation Profile: No results for input(s): INR, PROTIME in the last 168 hours. Cardiac Enzymes: No results for input(s): CKTOTAL, CKMB, CKMBINDEX, TROPONINI in the last 168 hours. BNP (last 3 results) No results for input(s): PROBNP in the last 8760 hours. HbA1C: No results for input(s): HGBA1C in the last 72 hours. CBG: Recent Labs  Lab 10/03/18 1139 10/03/18 1615 10/03/18 2103 10/04/18 0658 10/04/18 1147  GLUCAP 95 97 90 105* 90   Lipid Profile: No results for input(s): CHOL, HDL, LDLCALC, TRIG, CHOLHDL, LDLDIRECT in the last 72 hours. Thyroid Function Tests: No results for input(s): TSH, T4TOTAL, FREET4, T3FREE, THYROIDAB in the last 72 hours. Anemia Panel: No results for input(s): VITAMINB12, FOLATE, FERRITIN, TIBC, IRON, RETICCTPCT in the last 72 hours. Urine analysis:    Component Value Date/Time   COLORURINE YELLOW 10/04/2018 0823   APPEARANCEUR HAZY (A) 10/04/2018 0823   LABSPEC 1.010 10/04/2018 0823   PHURINE 5.0 10/04/2018 0823   GLUCOSEU NEGATIVE 10/04/2018 0823   HGBUR LARGE (A) 10/04/2018 0823   BILIRUBINUR NEGATIVE 10/04/2018 0823   KETONESUR NEGATIVE 10/04/2018 0823   PROTEINUR 100 (A) 10/04/2018 0823   NITRITE NEGATIVE 10/04/2018 0823   LEUKOCYTESUR NEGATIVE 10/04/2018 0823   Sepsis Labs: Invalid input(s): PROCALCITONIN, LACTICIDVEN  No results found for this or any previous visit (from the past 240 hour(s)).    Radiology Studies: No results found.  Marzetta Board, MD, PhD [facility]  Contact via  [email or web address]  [place]  P: [phone]  F: [phone]

## 2018-10-04 NOTE — Progress Notes (Signed)
  [facility] Progress Note    Assessment/ Plan:   1 AKI/CKD3: d/t ATN which is pretty profound.  He has had GI losses, losartan, HCTZ.  Baseline cr 1.01. Nonoliguric.  On Bicarb.  Cr slightly worse off IVFs.   Encouraged PO intake.  If pt's creatinine stabilizes/ gets better, can d/c.  2.  Diarrhea: had some again last night but none today.  Still not eating/drinking properly.  Encouraged PO intake.  3 Anemia stable  4 COPD/smoking-- no cigarettes in 10 days  5 PVD  6 CAD  7 Hematuria/bladder Ca--> will need urology f/u, pt and I discussed this today.  8.  Dispo: anticipate discharge once Cr plateaued/ better.      Had diarrhea last night but feeling better this AM.  Off IVFs.  Cr up a little.     Objective:   BP 132/80 (BP Location: Left Arm)   Pulse 81   Temp 98 F (36.7 C) (Oral)   Resp 20   Ht 5\' 10"  (1.778 m)   Wt 86.7 kg   SpO2 97%   BMI 27.43 kg/m   Intake/Output Summary (Last 24 hours) at 10/04/2018 1239 Last data filed at 10/04/2018 0915 Gross per 24 hour  Intake 1140 ml  Output 1026 ml  Net 114 ml   Weight change: 0.008 kg  Physical Exam: Gen: older gentleman, NAD CVS: RRR, no m/r/g Resp: clear bilaterally no c/w/r Abd: hyperactive BS Ext: no LE edema  Imaging: No results found.  Labs: BMET Recent Labs  Lab 09/28/18 0354 09/29/18 0457 09/30/18 0540 10/01/18 0407 10/02/18 0618 10/03/18 0531 10/04/18 0602  NA 137 138 134* 135 135 134* 138  K 3.7 3.8 4.0 3.5 3.8 3.5 3.8  CL 110 111 106 108 104 106 106  CO2 19* 21* 20* 19* 21* 19* 22  GLUCOSE 97 105* 96 86 94 91 90  BUN 64* 63* 61* 62* 64* 64* 67*  CREATININE 5.35* 5.51* 5.57* 5.59* 5.59* 5.63* 6.01*  CALCIUM 7.6* 7.6* 7.5* 7.6* 7.8* 7.5* 7.9*  PHOS 5.9* 5.8* 5.7* 5.9* 5.4* 5.3* 5.9*   CBC Recent Labs  Lab 09/27/18 1255 09/29/18 0457 10/02/18 0618  WBC 4.7 4.5 4.6  NEUTROABS 2.8  --   --   HGB 9.5* 7.9* 8.8*  HCT 29.7* 24.4* 26.7*  MCV 88.1 85.9 85.3  PLT 137*  128* 125*    Medications:    . atorvastatin  80 mg Oral QHS  . clopidogrel  75 mg Oral Daily  . doxazosin  4 mg Oral Daily  . ezetimibe  10 mg Oral Daily  . feeding supplement (GLUCERNA SHAKE)  237 mL Oral BID  . fenofibrate  160 mg Oral Daily  . insulin aspart  0-5 Units Subcutaneous QHS  . insulin aspart  0-9 Units Subcutaneous TID WC  . loratadine  10 mg Oral QHS  . metoprolol succinate  25 mg Oral Daily  . nicotine  21 mg Transdermal QHS  . sodium bicarbonate  1,300 mg Oral BID      Madelon Lips, MD [facility] [place] [phone] 10/04/2018, 12:39 PM

## 2018-10-04 NOTE — Care Management Important Message (Signed)
Important Message  Patient Details  Name: [name]. MRN: [id number] Date of Birth: Aug 02, 1945   Medicare Important Message Given:  Yes    Trini Christiansen 10/04/2018, 3:58 PM

## 2018-10-04 NOTE — Plan of Care (Signed)
  Problem: Safety: Goal: Ability to remain free from injury will improve Outcome: Progressing   

## 2018-10-05 DIAGNOSIS — N170 Acute kidney failure with tubular necrosis: Principal | ICD-10-CM

## 2018-10-05 HISTORY — DX: Acute kidney failure with tubular necrosis: N17.0

## 2018-10-05 LAB — RENAL FUNCTION PANEL
Albumin: 1.5 g/dL — ABNORMAL LOW (ref 3.5–5.0)
Anion gap: 11 (ref 5–15)
BUN: 71 mg/dL — ABNORMAL HIGH (ref 8–23)
CO2: 20 mmol/L — ABNORMAL LOW (ref 22–32)
Calcium: 7.6 mg/dL — ABNORMAL LOW (ref 8.9–10.3)
Chloride: 104 mmol/L (ref 98–111)
Creatinine, Ser: 6.03 mg/dL — ABNORMAL HIGH (ref 0.61–1.24)
GFR calc Af Amer: 10 mL/min — ABNORMAL LOW (ref >60)
GFR calc non Af Amer: 8 mL/min — ABNORMAL LOW (ref >60)
Glucose, Bld: 96 mg/dL (ref 70–99)
Phosphorus: 5.9 mg/dL — ABNORMAL HIGH (ref 2.5–4.6)
Potassium: 3.5 mmol/L (ref 3.5–5.1)
Sodium: 135 mmol/L (ref 135–145)

## 2018-10-05 LAB — PROTEIN ELECTROPHORESIS, SERUM
A/G Ratio: 0.5 — ABNORMAL LOW (ref 0.7–1.7)
Albumin ELP: 1.9 g/dL — ABNORMAL LOW (ref 2.9–4.4)
Alpha-1-Globulin: 0.3 g/dL (ref 0.0–0.4)
Alpha-2-Globulin: 0.5 g/dL (ref 0.4–1.0)
Beta Globulin: 0.7 g/dL (ref 0.7–1.3)
Gamma Globulin: 2.2 g/dL — ABNORMAL HIGH (ref 0.4–1.8)
Globulin, Total: 3.7 g/dL (ref 2.2–3.9)
M-Spike, %: 0.8 g/dL — ABNORMAL HIGH
Total Protein ELP: 5.6 g/dL — ABNORMAL LOW (ref 6.0–8.5)

## 2018-10-05 LAB — GLUCOSE, CAPILLARY
Glucose-Capillary: 97 mg/dL (ref 70–99)
Glucose-Capillary: 124 mg/dL — ABNORMAL HIGH (ref 70–99)
Glucose-Capillary: 114 mg/dL — ABNORMAL HIGH (ref 70–99)
Glucose-Capillary: 99 mg/dL (ref 70–99)

## 2018-10-05 LAB — KAPPA/LAMBDA LIGHT CHAINS
Kappa free light chain: 541.3 mg/L — ABNORMAL HIGH (ref 3.3–19.4)
Kappa, lambda light chain ratio: 2.46 — ABNORMAL HIGH (ref 0.26–1.65)
Lambda free light chains: 220.1 mg/L — ABNORMAL HIGH (ref 5.7–26.3)

## 2018-10-05 MED ORDER — ZOLPIDEM TARTRATE 5 MG PO TABS
5.0000 mg | ORAL_TABLET | Freq: Every evening | ORAL | Status: DC | PRN
  Administered 2018-10-05 – 2018-10-19 (×12): 5 mg via ORAL
  Filled 2018-10-05 (×14): qty 1

## 2018-10-05 MED ORDER — SODIUM CHLORIDE 0.9 % IV SOLN
INTRAVENOUS | Status: DC
  Administered 2018-10-05 – 2018-10-06 (×2): via INTRAVENOUS

## 2018-10-05 NOTE — Progress Notes (Signed)
[facility] PROGRESS NOTE    Progress Note  [name].  CLE:[id number] DOB: 10-Aug-1945 DOA: 09/27/2018 PCP: [name], MD     Brief Narrative:   [name]. is an 73 y.o. male past medical history of diabetes mellitus, hypertension COPD history of bladder cancer was sent to the hospital from her primary care doctor's office for worsening renal failure, he relates bouts of diarrhea for the past 3 weeks. Urinalysis on admission showed 3+ proteinuria with hematuria nephrology was consulted  Assessment/Plan:   Active Problems:   Acute renal failure (ARF) ([name])   ATN (acute tubular necrosis) ([name]) Baseline creatinine around 1 back in 07/19/2018 multiFactorial in the setting of GI losses, losartan and hydrochlorothiazide. On Admission he was started on IV fluids, but are now on hold ,nephrology was consulted, renal ultrasound shows no hydronephrosis or chronic kidney disease. His creatinine has not improved with conservative management. Daily remains nonoliguric, continue bicarbonate orally. His creatinine is slightly increased over the last several days, nephrology would like to continue to monitor and encourage oral intake. Further management per nephrology.  History of bladder cancer: He has been encouraged strongly to follow-up with urology as an outpatient.  Diabetes mellitus type 2: Continue sliding scale.  Hypretension: Continue metoprolol.  Coronary artery disease: Stable continue Lipitor and Plavix he is currently asymptomatic.  Hyperlipidemia: Continue current home medications.  Anemia of chronic kidney disease: Hemoglobin stable.   DVT prophylaxis: lovenox Family Communication:none Disposition Plan/Barrier to D/C: once Cr stable Code Status:     Code Status Orders  (From admission, onward)         Start     Ordered   09/27/18 1521  Full code  Continuous     09/27/18 1521        Code Status History    Date Active Date  Inactive Code Status Order ID Comments User Context   06/10/2018 1824 06/11/2018 1333 Full Code [id number]  [name], MD Inpatient   12/12/2014 1139 12/13/2014 0324 Full Code [id number]  Nelson Chimes, MD HOV   10/02/2013 1137 10/02/2013 1932 Partial Code [id number]  Sol Passer, MD ED        IV Access:    Peripheral IV   Procedures and diagnostic studies:   No results found.   Medical Consultants:    None.  Anti-Infectives:   NOne  Subjective:    [name]. no complaints fair appetite.  Objective:    Vitals:   10/04/18 1012 10/04/18 1833 10/04/18 2142 10/05/18 0650  BP: 132/80 136/73 134/71 (!) 142/83  Pulse: 81 90 80 77  Resp: 20 18 18 18   Temp: 98 F (36.7 C) 98.1 F (36.7 C) 98.6 F (37 C) 97.7 F (36.5 C)  TempSrc: Oral Oral Oral Oral  SpO2: 97% 97% 97% 97%  Weight:      Height:        Intake/Output Summary (Last 24 hours) at 10/05/2018 0848 Last data filed at 10/05/2018 0700 Gross per 24 hour  Intake 740 ml  Output 600 ml  Net 140 ml   Filed Weights   10/01/18 2118 10/02/18 2140 10/03/18 2211  Weight: 86.7 kg 86.7 kg 86.7 kg    Exam: General exam: In no acute distress. Respiratory system: Good air movement and clear to auscultation. Cardiovascular system: S1 & S2 heard, RRR. Gastrointestinal system: Abdomen is nondistended, soft and nontender.  Central nervous system: Alert and oriented. No focal neurological deficits. Extremities: No pedal edema. Skin: No rashes,  lesions or ulcers Psychiatry: Judgement and insight appear normal. Mood & affect appropriate.    Data Reviewed:    Labs: Basic Metabolic Panel: Recent Labs  Lab 09/30/18 0540 10/01/18 0407 10/02/18 0618 10/03/18 0531 10/04/18 0602  NA 134* 135 135 134* 138  K 4.0 3.5 3.8 3.5 3.8  CL 106 108 104 106 106  CO2 20* 19* 21* 19* 22  GLUCOSE 96 86 94 91 90  BUN 61* 62* 64* 64* 67*  CREATININE 5.57* 5.59* 5.59* 5.63* 6.01*  CALCIUM 7.5* 7.6* 7.8* 7.5* 7.9*  PHOS  5.7* 5.9* 5.4* 5.3* 5.9*   GFR Estimated Creatinine Clearance: 11.1 mL/min (A) (by C-G formula based on SCr of 6.01 mg/dL (H)). Liver Function Tests: Recent Labs  Lab 09/30/18 0540 10/01/18 0407 10/02/18 0618 10/03/18 0531 10/04/18 0602  ALBUMIN 1.7* 1.7* 1.7* 1.6* 1.7*   No results for input(s): LIPASE, AMYLASE in the last 168 hours. No results for input(s): AMMONIA in the last 168 hours. Coagulation profile No results for input(s): INR, PROTIME in the last 168 hours.  CBC: Recent Labs  Lab 09/29/18 0457 10/02/18 0618  WBC 4.5 4.6  HGB 7.9* 8.8*  HCT 24.4* 26.7*  MCV 85.9 85.3  PLT 128* 125*   Cardiac Enzymes: No results for input(s): CKTOTAL, CKMB, CKMBINDEX, TROPONINI in the last 168 hours. BNP (last 3 results) No results for input(s): PROBNP in the last 8760 hours. CBG: Recent Labs  Lab 10/04/18 0658 10/04/18 1147 10/04/18 1712 10/04/18 2141 10/05/18 0656  GLUCAP 105* 90 107* 107* 97   D-Dimer: No results for input(s): DDIMER in the last 72 hours. Hgb A1c: No results for input(s): HGBA1C in the last 72 hours. Lipid Profile: No results for input(s): CHOL, HDL, LDLCALC, TRIG, CHOLHDL, LDLDIRECT in the last 72 hours. Thyroid function studies: No results for input(s): TSH, T4TOTAL, T3FREE, THYROIDAB in the last 72 hours.  Invalid input(s): FREET3 Anemia work up: No results for input(s): VITAMINB12, FOLATE, FERRITIN, TIBC, IRON, RETICCTPCT in the last 72 hours. Sepsis Labs: Recent Labs  Lab 09/29/18 0457 10/02/18 0618  WBC 4.5 4.6   Microbiology No results found for this or any previous visit (from the past 240 hour(s)).   Medications:   . atorvastatin  80 mg Oral QHS  . clopidogrel  75 mg Oral Daily  . doxazosin  4 mg Oral Daily  . ezetimibe  10 mg Oral Daily  . feeding supplement (GLUCERNA SHAKE)  237 mL Oral BID  . fenofibrate  160 mg Oral Daily  . insulin aspart  0-5 Units Subcutaneous QHS  . insulin aspart  0-9 Units Subcutaneous TID WC   . loratadine  10 mg Oral QHS  . metoprolol succinate  25 mg Oral Daily  . nicotine  21 mg Transdermal QHS  . sodium bicarbonate  1,300 mg Oral BID   Continuous Infusions:    LOS: 8 days   Charlynne Cousins  [facility]  10/05/2018, 8:48 AM

## 2018-10-05 NOTE — Progress Notes (Signed)
  [facility] Progress Note    Assessment/ Plan:   1 AKI/CKD3: He has had GI losses, losartan, HCTZ and so the initial thinking was that he had ATN which seemed likely.  I still think this is likely--> quite a dense ATN, would have expected maybe some improvement by now.   Baseline cr 1.01. Nonoliguric.  I'm going to restart fluids today and perform a workup to look for other causes of AKI.  SPEP and serum free light chains pending, UP/C with 2.5 protein.  Will do ANA, C3 and C4, ANCA.     2.  Diarrhea: had some again last night but none today.  Still not eating/drinking properly.  Encouraged PO intake.  3 Anemia stable  4 COPD/smoking-- no cigarettes in 10 days  5 PVD  6 CAD  7 Hematuria/bladder Ca--> will need urology f/u, pt and I discussed this today.  8.  Dispo: anticipate discharge once Cr better.      Lying in bed.  Labs no better--> pt admits he's feeling "lousy".  Fatigued and no energy. Denies hiccups, dysgeusia, cloudy thinking.   Objective:   BP 111/66 (BP Location: Left Arm)   Pulse 85   Temp 98.1 F (36.7 C) (Oral)   Resp 18   Ht 5\' 10"  (1.778 m)   Wt 86.7 kg   SpO2 98%   BMI 27.43 kg/m   Intake/Output Summary (Last 24 hours) at 10/05/2018 1416 Last data filed at 10/05/2018 0900 Gross per 24 hour  Intake 300 ml  Output 600 ml  Net -300 ml   Weight change:   Physical Exam: Gen: older gentleman, NAD CVS: RRR, no m/r/g Resp: clear bilaterally no c/w/r Abd: hyperactive BS Ext: no LE edema NEURO: AAO x 3, no asterixis,   Imaging: No results found.  Labs: BMET Recent Labs  Lab 09/29/18 0457 09/30/18 0540 10/01/18 0407 10/02/18 0618 10/03/18 0531 10/04/18 0602 10/05/18 0544  NA 138 134* 135 135 134* 138 135  K 3.8 4.0 3.5 3.8 3.5 3.8 3.5  CL 111 106 108 104 106 106 104  CO2 21* 20* 19* 21* 19* 22 20*  GLUCOSE 105* 96 86 94 91 90 96  BUN 63* 61* 62* 64* 64* 67* 71*  CREATININE 5.51* 5.57* 5.59* 5.59* 5.63* 6.01* 6.03*   CALCIUM 7.6* 7.5* 7.6* 7.8* 7.5* 7.9* 7.6*  PHOS 5.8* 5.7* 5.9* 5.4* 5.3* 5.9* 5.9*   CBC Recent Labs  Lab 09/29/18 0457 10/02/18 0618  WBC 4.5 4.6  HGB 7.9* 8.8*  HCT 24.4* 26.7*  MCV 85.9 85.3  PLT 128* 125*    Medications:    . atorvastatin  80 mg Oral QHS  . clopidogrel  75 mg Oral Daily  . doxazosin  4 mg Oral Daily  . ezetimibe  10 mg Oral Daily  . feeding supplement (GLUCERNA SHAKE)  237 mL Oral BID  . fenofibrate  160 mg Oral Daily  . insulin aspart  0-5 Units Subcutaneous QHS  . insulin aspart  0-9 Units Subcutaneous TID WC  . loratadine  10 mg Oral QHS  . metoprolol succinate  25 mg Oral Daily  . nicotine  21 mg Transdermal QHS  . sodium bicarbonate  1,300 mg Oral BID      Madelon Lips, MD [facility] [phone] 10/05/2018, 2:16 PM

## 2018-10-06 ENCOUNTER — Inpatient Hospital Stay (HOSPITAL_COMMUNITY): Payer: Medicare HMO

## 2018-10-06 DIAGNOSIS — E1129 Type 2 diabetes mellitus with other diabetic kidney complication: Secondary | ICD-10-CM

## 2018-10-06 DIAGNOSIS — Z8551 Personal history of malignant neoplasm of bladder: Secondary | ICD-10-CM

## 2018-10-06 DIAGNOSIS — I2510 Atherosclerotic heart disease of native coronary artery without angina pectoris: Secondary | ICD-10-CM

## 2018-10-06 HISTORY — DX: Personal history of malignant neoplasm of bladder: Z85.51

## 2018-10-06 HISTORY — DX: Type 2 diabetes mellitus with other diabetic kidney complication: E11.29

## 2018-10-06 LAB — PROTIME-INR
INR: 1.4 — ABNORMAL HIGH (ref 0.8–1.2)
Prothrombin Time: 16.6 seconds — ABNORMAL HIGH (ref 11.4–15.2)

## 2018-10-06 LAB — RENAL FUNCTION PANEL
Albumin: 1.6 g/dL — ABNORMAL LOW (ref 3.5–5.0)
Anion gap: 10 (ref 5–15)
BUN: 70 mg/dL — ABNORMAL HIGH (ref 8–23)
CO2: 22 mmol/L (ref 22–32)
Calcium: 7.4 mg/dL — ABNORMAL LOW (ref 8.9–10.3)
Chloride: 104 mmol/L (ref 98–111)
Creatinine, Ser: 6.47 mg/dL — ABNORMAL HIGH (ref 0.61–1.24)
GFR calc Af Amer: 9 mL/min — ABNORMAL LOW (ref >60)
GFR calc non Af Amer: 8 mL/min — ABNORMAL LOW (ref >60)
Glucose, Bld: 90 mg/dL (ref 70–99)
Phosphorus: 6 mg/dL — ABNORMAL HIGH (ref 2.5–4.6)
Potassium: 3.6 mmol/L (ref 3.5–5.1)
Sodium: 136 mmol/L (ref 135–145)

## 2018-10-06 LAB — GLUCOSE, CAPILLARY
Glucose-Capillary: 85 mg/dL (ref 70–99)
Glucose-Capillary: 97 mg/dL (ref 70–99)
Glucose-Capillary: 109 mg/dL — ABNORMAL HIGH (ref 70–99)
Glucose-Capillary: 96 mg/dL (ref 70–99)
Glucose-Capillary: 141 mg/dL — ABNORMAL HIGH (ref 70–99)

## 2018-10-06 LAB — ENA+DNA/DS+ANTICH+CENTRO+JO...
Anti JO-1: <0.2 AI (ref 0.0–0.9)
Centromere Ab Screen: <0.2 AI (ref 0.0–0.9)
Chromatin Ab SerPl-aCnc: <0.2 AI (ref 0.0–0.9)
ENA SM Ab Ser-aCnc: <0.2 AI (ref 0.0–0.9)
Ribonucleic Protein: <0.2 AI (ref 0.0–0.9)
SSA (Ro) (ENA) Antibody, IgG: <0.2 AI (ref 0.0–0.9)
SSB (La) (ENA) Antibody, IgG: <0.2 AI (ref 0.0–0.9)
Scleroderma (Scl-70) (ENA) Antibody, IgG: <0.2 AI (ref 0.0–0.9)
ds DNA Ab: 52 IU/mL — ABNORMAL HIGH (ref 0–9)

## 2018-10-06 LAB — ANCA TITERS
Atypical P-ANCA titer: <1:20 {titer}
C-ANCA: <1:20 {titer}
P-ANCA: <1:20 {titer}

## 2018-10-06 LAB — C3 COMPLEMENT: C3 Complement: 92 mg/dL (ref 82–167)

## 2018-10-06 LAB — C4 COMPLEMENT: Complement C4, Body Fluid: 13 mg/dL — ABNORMAL LOW (ref 14–44)

## 2018-10-06 LAB — ANA W/REFLEX IF POSITIVE: Anti Nuclear Antibody (ANA): POSITIVE — AB

## 2018-10-06 MED ORDER — NEPRO/CARBSTEADY PO LIQD
237.0000 mL | Freq: Two times a day (BID) | ORAL | Status: DC
  Filled 2018-10-06 (×15): qty 237

## 2018-10-06 NOTE — Progress Notes (Signed)
  [facility] Progress Note    Assessment/ Plan:   1 AKI/CKD3: He has had GI losses, losartan, HCTZ and so the initial thinking was that he had ATN which seemed likely.  I still think this is likely--> quite a dense ATN, would have expected maybe some improvement by now.   Baseline cr 1.01. Nonoliguric.  I'm going to restart fluids today and perform a workup to look for other causes of AKI.  SPEP with 0.8 M-spike UP/C with 2.5 protein.   ANA pending, C3 normal and C4 slightly low, ANCA pending.  Repeat renal US and have discussed biopsy with pt and placed order.  I stopped his order for Plavix today.     2.  Diarrhea: had some again last night but none today.  Still not eating/drinking properly.  Encouraged PO intake.  3 Anemia stable  4 COPD/smoking-- no cigarettes in 10 days  5 PVD  6 CAD- on Plavix  7 Hematuria/bladder Ca--> will need urology f/u, discussed with pt  8.  Dispo: anticipate discharge once Cr better and after complete workup.    Cr continues to rise.  M-spike on SPEP.  C4 slightly low.  Will make sure that he doesn't have obstruction again on renal US; discussed need for biopsy.     Objective:   BP (!) 152/79 (BP Location: Left Arm)   Pulse 91   Temp 98.2 F (36.8 C) (Oral)   Resp 19   Ht 5\' 10"  (1.778 m)   Wt 90.6 kg   SpO2 96%   BMI 28.66 kg/m   Intake/Output Summary (Last 24 hours) at 10/06/2018 1035 Last data filed at 10/06/2018 6644 Gross per 24 hour  Intake 2011.54 ml  Output 250 ml  Net 1761.54 ml   Weight change:   Physical Exam: Gen: older gentleman, NAD CVS: RRR, no m/r/g Resp: clear bilaterally no c/w/r Abd: hyperactive BS Ext: no LE edema NEURO: AAO x 3, no asterixis,  Imaging: No results found.  Labs: BMET Recent Labs  Lab 09/30/18 0540 10/01/18 0407 10/02/18 0618 10/03/18 0531 10/04/18 0602 10/05/18 0544 10/06/18 0615  NA 134* 135 135 134* 138 135 136  K 4.0 3.5 3.8 3.5 3.8 3.5 3.6  CL 106 108 104 106 106  104 104  CO2 20* 19* 21* 19* 22 20* 22  GLUCOSE 96 86 94 91 90 96 90  BUN 61* 62* 64* 64* 67* 71* 70*  CREATININE 5.57* 5.59* 5.59* 5.63* 6.01* 6.03* 6.47*  CALCIUM 7.5* 7.6* 7.8* 7.5* 7.9* 7.6* 7.4*  PHOS 5.7* 5.9* 5.4* 5.3* 5.9* 5.9* 6.0*   CBC Recent Labs  Lab 10/02/18 0618  WBC 4.6  HGB 8.8*  HCT 26.7*  MCV 85.3  PLT 125*    Medications:    . atorvastatin  80 mg Oral QHS  . doxazosin  4 mg Oral Daily  . ezetimibe  10 mg Oral Daily  . feeding supplement (GLUCERNA SHAKE)  237 mL Oral BID  . fenofibrate  160 mg Oral Daily  . insulin aspart  0-5 Units Subcutaneous QHS  . insulin aspart  0-9 Units Subcutaneous TID WC  . loratadine  10 mg Oral QHS  . metoprolol succinate  25 mg Oral Daily  . nicotine  21 mg Transdermal QHS  . sodium bicarbonate  1,300 mg Oral BID      Madelon Lips, MD [facility] [phone] 10/06/2018, 10:35 AM

## 2018-10-06 NOTE — Progress Notes (Signed)
Nutrition Follow-up  DOCUMENTATION CODES:   Not applicable  INTERVENTION:   Remove Carb Mod diet restriction  Change supplement to Nepro Shake po BID, each supplement provides 425 kcal and 19 grams protein  NUTRITION DIAGNOSIS:   Inadequate oral intake related to poor appetite, early satiety as evidenced by per patient/family report, estimated needs.  Being addressed via supplements  GOAL:   Patient will meet greater than or equal to 90% of their needs  Progressing  MONITOR:   PO intake, Supplement acceptance, Weight trends, Labs, I & O's  REASON FOR ASSESSMENT:   Malnutrition Screening Tool    ASSESSMENT:   73 yo male, admitted with acute renal failure. PMH significant for h/o bladder cancer, COPD, CAD, CM, h/o colonic polyps, hyperlipidemia, HTN, PAD.  Pt reports appetite is fair. Recorded po intake 75-100% of meals  CBGs 85-109, on carb modified diet with limited po. Not really requiring insulin regimen.  Recommend liberalizing diet.   Labs: Creatinine 6.47, BUN 70, phosphorus 6.0 (H) Meds: fenofibrate, ss novolog, sodium bicarb tablet   Diet Order:   Diet Order            Diet NPO time specified Except for: Sips with Meds  Diet effective midnight        Diet 2 gram sodium Room service appropriate? Yes; Fluid consistency: Thin  Diet effective now              EDUCATION NEEDS:   Education needs have been addressed  Skin:  Skin Assessment: Skin Integrity Issues: Skin Integrity Issues:: Other (Comment) Other: Ecchymosis - BL arms  Last BM:  4/08  Height:   Ht Readings from Last 1 Encounters:  09/27/18 5\' 10"  (1.778 m)    Weight:   Wt Readings from Last 1 Encounters:  10/05/18 90.6 kg    Ideal Body Weight:  75.5 kg  BMI:  Body mass index is 28.66 kg/m.  Estimated Nutritional Needs:   Kcal:  1900-2280 (25-30 kcal/kg IBW)  Protein:  91-114 gm (1.2-1.5 g/kg IBW)  Fluid:  >/= 1.5 L daily or per MD   Kerman Passey MS, RD, LDN,  CNSC [phone] Pager  [phone] Weekend/On-Call Pager

## 2018-10-06 NOTE — Progress Notes (Signed)
[facility] PROGRESS NOTE    Progress Note  [name].  NIO:[id number] DOB: Jun 07, 1946 DOA: 09/27/2018 PCP: [name], MD     Brief Narrative:   [name] Adduci. is an 73 y.o. male past medical history of diabetes mellitus, hypertension COPD history of bladder cancer was sent to the hospital from her primary care doctor's office for worsening renal failure, he relates bouts of diarrhea for the past 3 weeks. Urinalysis on admission showed 3+ proteinuria with hematuria nephrology was consulted  Assessment/Plan:   Acute renal failure (ARF) ([place]) Baseline creatinine around 1 back in 07/19/2018 Of unclear etiology His creatinine is slightly increased over the last several days,  Nephrology would like to continue to monitor and encourage oral intake. SPEP showed an M spike, crease and C4 complement so nephrology is recommended renal biopsy. Discontinue Plavix.  History of bladder cancer: He has been encouraged strongly to follow-up with urology as an outpatient.  Diabetes mellitus type 2: Continue sliding scale.  Hypretension: Continue metoprolol.  Coronary artery disease: Stable continue Lipitor, currently asymptomatic hold Plavix for possible renal biopsy.  Hyperlipidemia: Continue current home medications.  Anemia of chronic kidney disease: Hemoglobin stable.   DVT prophylaxis: lovenox Family Communication:none Disposition Plan/Barrier to D/C: Once acute renal failure work-up completed. Code Status:     Code Status Orders  (From admission, onward)         Start     Ordered   09/27/18 1521  Full code  Continuous     09/27/18 1521        Code Status History    Date Active Date Inactive Code Status Order ID Comments User Context   06/10/2018 1824 06/11/2018 1333 Full Code [id number]  [name], MD Inpatient   12/12/2014 1139 12/13/2014 0324 Full Code [id number]  Nelson Chimes, MD HOV   10/02/2013 1137 10/02/2013 1932 Partial Code [id number]   Sol Passer, MD ED        IV Access:    Peripheral IV   Procedures and diagnostic studies:   No results found.   Medical Consultants:    None.  Anti-Infectives:   NOne  Subjective:    [name]. no complaints fair appetite.  Objective:    Vitals:   10/05/18 0958 10/05/18 1641 10/05/18 2138 10/06/18 0446  BP: 111/66 134/73 (!) 148/81 (!) 152/79  Pulse: 85 79 85 91  Resp: 18 18 (!) 22 19  Temp: 98.1 F (36.7 C) 98.6 F (37 C) 98.2 F (36.8 C) 98.2 F (36.8 C)  TempSrc: Oral Oral Oral Oral  SpO2: 98% 98% 97% 96%  Weight:   90.6 kg   Height:        Intake/Output Summary (Last 24 hours) at 10/06/2018 1007 Last data filed at 10/06/2018 0526 Gross per 24 hour  Intake 2011.54 ml  Output 250 ml  Net 1761.54 ml   Filed Weights   10/02/18 2140 10/03/18 2211 10/05/18 2138  Weight: 86.7 kg 86.7 kg 90.6 kg    Exam: General exam: In no acute distress. Respiratory system: Good air movement and clear to auscultation. Cardiovascular system: S1 & S2 heard, RRR. Gastrointestinal system: Abdomen is nondistended, soft and nontender.  Central nervous system: Alert and oriented. No focal neurological deficits. Extremities: No pedal edema. Skin: No rashes, lesions or ulcers Psychiatry: Judgement and insight appear normal. Mood & affect appropriate.    Data Reviewed:    Labs: Basic Metabolic Panel: Recent Labs  Lab 10/02/18 0618 10/03/18 0531 10/04/18 5277  10/05/18 0544 10/06/18 0615  NA 135 134* 138 135 136  K 3.8 3.5 3.8 3.5 3.6  CL 104 106 106 104 104  CO2 21* 19* 22 20* 22  GLUCOSE 94 91 90 96 90  BUN 64* 64* 67* 71* 70*  CREATININE 5.59* 5.63* 6.01* 6.03* 6.47*  CALCIUM 7.8* 7.5* 7.9* 7.6* 7.4*  PHOS 5.4* 5.3* 5.9* 5.9* 6.0*   GFR Estimated Creatinine Clearance: 11.3 mL/min (A) (by C-G formula based on SCr of 6.47 mg/dL (H)). Liver Function Tests: Recent Labs  Lab 10/02/18 0618 10/03/18 0531 10/04/18 0602 10/05/18 0544  10/06/18 0615  ALBUMIN 1.7* 1.6* 1.7* 1.5* 1.6*   No results for input(s): LIPASE, AMYLASE in the last 168 hours. No results for input(s): AMMONIA in the last 168 hours. Coagulation profile No results for input(s): INR, PROTIME in the last 168 hours.  CBC: Recent Labs  Lab 10/02/18 0618  WBC 4.6  HGB 8.8*  HCT 26.7*  MCV 85.3  PLT 125*   Cardiac Enzymes: No results for input(s): CKTOTAL, CKMB, CKMBINDEX, TROPONINI in the last 168 hours. BNP (last 3 results) No results for input(s): PROBNP in the last 8760 hours. CBG: Recent Labs  Lab 10/05/18 1135 10/05/18 1642 10/05/18 2141 10/06/18 0448 10/06/18 0806  GLUCAP 124* 114* 99 85 97   D-Dimer: No results for input(s): DDIMER in the last 72 hours. Hgb A1c: No results for input(s): HGBA1C in the last 72 hours. Lipid Profile: No results for input(s): CHOL, HDL, LDLCALC, TRIG, CHOLHDL, LDLDIRECT in the last 72 hours. Thyroid function studies: No results for input(s): TSH, T4TOTAL, T3FREE, THYROIDAB in the last 72 hours.  Invalid input(s): FREET3 Anemia work up: No results for input(s): VITAMINB12, FOLATE, FERRITIN, TIBC, IRON, RETICCTPCT in the last 72 hours. Sepsis Labs: Recent Labs  Lab 10/02/18 0618  WBC 4.6   Microbiology No results found for this or any previous visit (from the past 240 hour(s)).   Medications:    atorvastatin  80 mg Oral QHS   doxazosin  4 mg Oral Daily   ezetimibe  10 mg Oral Daily   feeding supplement (GLUCERNA SHAKE)  237 mL Oral BID   fenofibrate  160 mg Oral Daily   insulin aspart  0-5 Units Subcutaneous QHS   insulin aspart  0-9 Units Subcutaneous TID WC   loratadine  10 mg Oral QHS   metoprolol succinate  25 mg Oral Daily   nicotine  21 mg Transdermal QHS   sodium bicarbonate  1,300 mg Oral BID   Continuous Infusions:  sodium chloride 75 mL/hr at 10/05/18 1441      LOS: 9 days   Charlynne Cousins  [facility]  10/06/2018, 10:07 AM

## 2018-10-07 ENCOUNTER — Inpatient Hospital Stay (HOSPITAL_COMMUNITY): Payer: Medicare HMO

## 2018-10-07 ENCOUNTER — Encounter (HOSPITAL_COMMUNITY): Payer: Self-pay | Admitting: Interventional Radiology

## 2018-10-07 HISTORY — PX: IR US GUIDE VASC ACCESS RIGHT: IMG2390

## 2018-10-07 HISTORY — PX: IR FLUORO GUIDE CV LINE RIGHT: IMG2283

## 2018-10-07 LAB — RENAL FUNCTION PANEL
Albumin: 1.6 g/dL — ABNORMAL LOW (ref 3.5–5.0)
Anion gap: 8 (ref 5–15)
BUN: 72 mg/dL — ABNORMAL HIGH (ref 8–23)
CO2: 22 mmol/L (ref 22–32)
Calcium: 7.4 mg/dL — ABNORMAL LOW (ref 8.9–10.3)
Chloride: 107 mmol/L (ref 98–111)
Creatinine, Ser: 6.53 mg/dL — ABNORMAL HIGH (ref 0.61–1.24)
GFR calc Af Amer: 9 mL/min — ABNORMAL LOW (ref >60)
GFR calc non Af Amer: 8 mL/min — ABNORMAL LOW (ref >60)
Glucose, Bld: 99 mg/dL (ref 70–99)
Phosphorus: 6 mg/dL — ABNORMAL HIGH (ref 2.5–4.6)
Potassium: 3.5 mmol/L (ref 3.5–5.1)
Sodium: 137 mmol/L (ref 135–145)

## 2018-10-07 LAB — GLUCOSE, CAPILLARY
Glucose-Capillary: 97 mg/dL (ref 70–99)
Glucose-Capillary: 105 mg/dL — ABNORMAL HIGH (ref 70–99)
Glucose-Capillary: 111 mg/dL — ABNORMAL HIGH (ref 70–99)

## 2018-10-07 MED ORDER — ALTEPLASE 2 MG IJ SOLR
2.0000 mg | Freq: Once | INTRAMUSCULAR | Status: DC | PRN
  Filled 2018-10-07: qty 2

## 2018-10-07 MED ORDER — SODIUM CHLORIDE 0.9 % IV SOLN: 100.0000 mL | INTRAVENOUS | Status: DC | PRN

## 2018-10-07 MED ORDER — LIDOCAINE HCL 1 % IJ SOLN
INTRAMUSCULAR | Status: AC
  Filled 2018-10-07: qty 20

## 2018-10-07 MED ORDER — HEPARIN SODIUM (PORCINE) 1000 UNIT/ML IJ SOLN
INTRAMUSCULAR | Status: AC
  Filled 2018-10-07: qty 1

## 2018-10-07 MED ORDER — PENTAFLUOROPROP-TETRAFLUOROETH EX AERO: 1.0000 "application " | INHALATION_SPRAY | CUTANEOUS | Status: DC | PRN

## 2018-10-07 MED ORDER — CALCIUM ACETATE (PHOS BINDER) 667 MG PO CAPS
667.0000 mg | ORAL_CAPSULE | Freq: Three times a day (TID) | ORAL | Status: DC
  Administered 2018-10-08 – 2018-10-20 (×27): 667 mg via ORAL
  Filled 2018-10-07 (×29): qty 1

## 2018-10-07 MED ORDER — ANTICOAGULANT SODIUM CITRATE 4% (200MG/5ML) IV SOLN
5.0000 mL | Freq: Once | Status: AC
  Filled 2018-10-07: qty 5

## 2018-10-07 MED ORDER — LIDOCAINE HCL 1 % IJ SOLN
INTRAMUSCULAR | Status: AC | PRN
  Administered 2018-10-07: 10 mL

## 2018-10-07 MED ORDER — LIDOCAINE-PRILOCAINE 2.5-2.5 % EX CREA: 1.0000 "application " | TOPICAL_CREAM | CUTANEOUS | Status: DC | PRN

## 2018-10-07 MED ORDER — LIDOCAINE HCL (PF) 1 % IJ SOLN: 5.0000 mL | INTRAMUSCULAR | Status: DC | PRN

## 2018-10-07 MED ORDER — ANTICOAGULANT SODIUM CITRATE 4% (200MG/5ML) IV SOLN
2.8000 mL | Freq: Once | Status: AC
  Administered 2018-10-07: 2.8 mL via INTRAVENOUS
  Filled 2018-10-07: qty 2.8
  Filled 2018-10-07: qty 5

## 2018-10-07 MED ORDER — CHLORHEXIDINE GLUCONATE CLOTH 2 % EX PADS
6.0000 | MEDICATED_PAD | Freq: Every day | CUTANEOUS | Status: DC
  Administered 2018-10-08 – 2018-10-19 (×8): 6 via TOPICAL

## 2018-10-07 NOTE — Care Management Important Message (Signed)
Important Message  Patient Details  Name: [name]. MRN: [id number] Date of Birth: July 14, 1945   Medicare Important Message Given:  Yes    Araya Roel 10/07/2018, 2:56 PM

## 2018-10-07 NOTE — Progress Notes (Addendum)
[facility] Progress Note    Assessment/ Plan:   1 AKI/CKD3: He has had GI losses x 3 weeks, losartan, HCTZ and so the initial thinking was that he had ATN which seemed likely.  It would be quite a dense ATN, would have expected maybe some improvement by now.   Baseline cr 1.01. Nonoliguric.  SPEP with 0.8 M-spike UP/C with 2.5 protein.   ANA + with dsDNA at 52, C3 normal and C4 slightly low, C and P anca negative.  Repeat renal US negative and have discussed biopsy with pt and placed order--> given + serologic workup would be essential to investigate renal tissue to assess for reversibility of disease.  I stopped his order for Plavix 4/9.  Given his persistent n/v, I think he is uremic and we will need to start dialysis (hopefully on a temporary basis) until we can obtain renal tissue for evaluation.  NPO, ordered nontunneled HD catheter with IR, HD #1 after cath placement.  I am hesitant to start empiric immunosuppression given the history of bladder cancer; his hematuria could be GN mediated, will re-send UA and I'll see if I can spin urine myself (may not be possible to go into lab given COVID-19 restrictions but will see).      2.  Diarrhea: Resolved  3 Anemia: CBC stable 8.5, will order for tomorrow.  Iron sats 15%, got feraheme x 2.     4 COPD/smoking-- no cigarettes in 10 days  5 CAD- on metoprolol and Plavix, stopped 4/9.  6 Hematuria/bladder Ca--> discussion as above.   7.  Metabolic acidosis: on bicarb 1300 mg BID   8.  Alpha-gal allergy: cannot have heparin in dialysis, need sodium citrate locks for HD catheter  9.  Dispo: pending    Still not eating.  "I'm not eating enough to feed a bird". I think he's uremic.  Discussed starting dialysis (hopefully temporary) to alleviate uremic symptoms and await biopsy.     Objective:   BP 133/78 (BP Location: Left Arm)   Pulse 91   Temp 97.9 F (36.6 C) (Oral)   Resp 18   Ht 5\' 10"  (1.778 m)   Wt 91.5 kg   SpO2  93%   BMI 28.94 kg/m   Intake/Output Summary (Last 24 hours) at 10/07/2018 0956 Last data filed at 10/07/2018 5573 Gross per 24 hour  Intake 2847.16 ml  Output 550 ml  Net 2297.16 ml   Weight change: 0.9 kg  Physical Exam:  Gen: older gentleman, NAD, lying in bed with the lights off as usual CVS: RRR, no m/r/g Resp: clear bilaterally no c/w/r Abd: hyperactive BS Ext: no LE edema NEURO: AAO x 3, no asterixis  Imaging: US Renal  Result Date: 10/06/2018 CLINICAL DATA:  Acute kidney injury. EXAM: RENAL / URINARY TRACT ULTRASOUND COMPLETE COMPARISON:  09/27/2018 FINDINGS: Right Kidney: Renal measurements: 11.0 x 6.6 x 6.5 cm = volume: 245 mL. Mildly increased parenchymal echogenicity. No mass or hydronephrosis visualized. Left Kidney: Renal measurements: 12.1 x 5.9 x 5.3 cm = volume: 199 mL. Borderline increased parenchymal echogenicity. Small upper pole and interpolar cysts measuring 8 mm and 11 mm, respectively. No hydronephrosis. Bladder: Appears normal for degree of bladder distention. IMPRESSION: 1. No hydronephrosis. 2. Mildly echogenic kidneys compatible with medical renal disease. Electronically Signed   By: Logan Bores M.D.   On: 10/06/2018 11:40    Labs: [employer] Recent [employer] 10/01/18 0407 10/02/18 2202 10/03/18 5427 10/04/18 0602 10/05/18 0623 10/06/18 7628  10/07/18 0653  NA 135 135 134* 138 135 136 137  K 3.5 3.8 3.5 3.8 3.5 3.6 3.5  CL 108 104 106 106 104 104 107  CO2 19* 21* 19* 22 20* 22 22  GLUCOSE 86 94 91 90 96 90 99  BUN 62* 64* 64* 67* 71* 70* 72*  CREATININE 5.59* 5.59* 5.63* 6.01* 6.03* 6.47* 6.53*  CALCIUM 7.6* 7.8* 7.5* 7.9* 7.6* 7.4* 7.4*  PHOS 5.9* 5.4* 5.3* 5.9* 5.9* 6.0* 6.0*   CBC Recent Labs  Lab 10/02/18 0618  WBC 4.6  HGB 8.8*  HCT 26.7*  MCV 85.3  PLT 125*    Medications:    . atorvastatin  80 mg Oral QHS  . doxazosin  4 mg Oral Daily  . ezetimibe  10 mg Oral Daily  . feeding supplement (NEPRO CARB STEADY)  237 mL Oral BID BM   . fenofibrate  160 mg Oral Daily  . insulin aspart  0-5 Units Subcutaneous QHS  . insulin aspart  0-9 Units Subcutaneous TID WC  . loratadine  10 mg Oral QHS  . metoprolol succinate  25 mg Oral Daily  . nicotine  21 mg Transdermal QHS  . sodium bicarbonate  1,300 mg Oral BID      Madelon Lips, MD [facility] [phone] 10/07/2018, 9:56 AM

## 2018-10-07 NOTE — Procedures (Signed)
  Procedure: R IJ 20cm HD catheter  placement EBL:   minimal Complications:  none immediate  See full dictation in [employer].  Dillard Cannon MD Main # [phone] Pager  [phone]

## 2018-10-07 NOTE — Progress Notes (Addendum)
[facility] PROGRESS NOTE    Progress Note  [name].  LGX:[id number] DOB: [birth year] DOA: 09/27/2018 PCP: [name], MD     Brief Narrative:   [name]. is an 73 y.o. male past medical history of diabetes mellitus, hypertension COPD history of bladder cancer was sent to the hospital from her primary care doctor's office for worsening renal failure, he relates bouts of diarrhea for the past 3 weeks. Urinalysis on admission showed 3+ proteinuria with hematuria nephrology was consulted  Assessment/Plan:   Acute renal failure (ARF) ([place]) Baseline creatinine around 1 back in 07/19/2018 His creatinine is slightly increased over the last several days,  SPEP showed an M spike, crease and C4 complement so nephrology is recommended renal biopsy. Discontinue Plavix. IR has been consulted for renal biopsy and tunneled catheter.  Hyperphosphatemia: We will start him on PhosLo 3 times daily.  Continue to follow phosphorus closely.  History of bladder cancer: He has been encouraged strongly to follow-up with urology as an outpatient.  Diabetes mellitus type 2: Still uncontrolled continue monitor blood glucose.  Essential Hypretension: , Continue metoprolol.  Coronary artery disease: Stable continue Lipitor, currently asymptomatic hold Plavix for possible renal biopsy.  Hyperlipidemia: Continue current home medications.  Anemia of chronic kidney disease: Hemoglobin stable.   DVT prophylaxis: SCD's Family Communication:none Disposition Plan/Barrier to D/C: Once acute renal failure work-up completed. Code Status:     Code Status Orders  (From admission, onward)         Start     Ordered   09/27/18 1521  Full code  Continuous     09/27/18 1521        Code Status History    Date Active Date Inactive Code Status Order ID Comments User Context   06/10/2018 1824 06/11/2018 1333 Full Code [id number]  [name], MD Inpatient   12/12/2014 1139  12/13/2014 0324 Full Code [id number]  Nelson Chimes, MD HOV   10/02/2013 1137 10/02/2013 1932 Partial Code [id number]  Sol Passer, MD ED        IV Access:    Peripheral IV   Procedures and diagnostic studies:   US Renal  Result Date: 10/06/2018 CLINICAL DATA:  Acute kidney injury. EXAM: RENAL / URINARY TRACT ULTRASOUND COMPLETE COMPARISON:  09/27/2018 FINDINGS: Right Kidney: Renal measurements: 11.0 x 6.6 x 6.5 cm = volume: 245 mL. Mildly increased parenchymal echogenicity. No mass or hydronephrosis visualized. Left Kidney: Renal measurements: 12.1 x 5.9 x 5.3 cm = volume: 199 mL. Borderline increased parenchymal echogenicity. Small upper pole and interpolar cysts measuring 8 mm and 11 mm, respectively. No hydronephrosis. Bladder: Appears normal for degree of bladder distention. IMPRESSION: 1. No hydronephrosis. 2. Mildly echogenic kidneys compatible with medical renal disease. Electronically Signed   By: Logan Bores M.D.   On: 10/06/2018 11:40     Medical Consultants:    None.  Anti-Infectives:   NOne  Subjective:    [name]. no complaints fair appetite.  Objective:    Vitals:   10/06/18 1658 10/06/18 1953 10/07/18 0437 10/07/18 0912  BP: (!) 151/74 (!) 161/88 (!) 148/89 133/78  Pulse: 87 95 97 91  Resp: 18 (!) 21 20 18   Temp: 98.1 F (36.7 C) 98.2 F (36.8 C) 98.1 F (36.7 C) 97.9 F (36.6 C)  TempSrc: Oral Oral Oral Oral  SpO2: 96% 96% 93% 93%  Weight:  91.5 kg    Height:        Intake/Output Summary (Last 24 hours)  at 10/07/2018 0955 Last data filed at 10/07/2018 0437 Gross per 24 hour  Intake 2847.16 ml  Output 550 ml  Net 2297.16 ml   Filed Weights   10/03/18 2211 10/05/18 2138 10/06/18 1953  Weight: 86.7 kg 90.6 kg 91.5 kg    Exam: General exam: In no acute distress. Respiratory system: Good air movement and clear to auscultation. Cardiovascular system: S1 & S2 heard, RRR. Gastrointestinal system: Abdomen is nondistended, soft and  nontender.  Central nervous system: Alert and oriented. No focal neurological deficits. Extremities: No pedal edema. Skin: No rashes, lesions or ulcers Psychiatry: Judgement and insight appear normal. Mood & affect appropriate.    Data Reviewed:    Labs: Basic Metabolic Panel: Recent Labs  Lab 10/03/18 0531 10/04/18 0602 10/05/18 0544 10/06/18 0615 10/07/18 0653  NA 134* 138 135 136 137  K 3.5 3.8 3.5 3.6 3.5  CL 106 106 104 104 107  CO2 19* 22 20* 22 22  GLUCOSE 91 90 96 90 99  BUN 64* 67* 71* 70* 72*  CREATININE 5.63* 6.01* 6.03* 6.47* 6.53*  CALCIUM 7.5* 7.9* 7.6* 7.4* 7.4*  PHOS 5.3* 5.9* 5.9* 6.0* 6.0*   GFR Estimated Creatinine Clearance: 11.3 mL/min (A) (by C-G formula based on SCr of 6.53 mg/dL (H)). Liver Function Tests: Recent Labs  Lab 10/03/18 0531 10/04/18 0602 10/05/18 0544 10/06/18 0615 10/07/18 0653  ALBUMIN 1.6* 1.7* 1.5* 1.6* 1.6*   No results for input(s): LIPASE, AMYLASE in the last 168 hours. No results for input(s): AMMONIA in the last 168 hours. Coagulation profile Recent Labs  Lab 10/06/18 1152  INR 1.4*    CBC: Recent Labs  Lab 10/02/18 0618  WBC 4.6  HGB 8.8*  HCT 26.7*  MCV 85.3  PLT 125*   Cardiac Enzymes: No results for input(s): CKTOTAL, CKMB, CKMBINDEX, TROPONINI in the last 168 hours. BNP (last 3 results) No results for input(s): PROBNP in the last 8760 hours. CBG: Recent Labs  Lab 10/06/18 0806 10/06/18 1153 10/06/18 1657 10/06/18 2002 10/07/18 0438  GLUCAP 97 109* 96 141* 97   D-Dimer: No results for input(s): DDIMER in the last 72 hours. Hgb A1c: No results for input(s): HGBA1C in the last 72 hours. Lipid Profile: No results for input(s): CHOL, HDL, LDLCALC, TRIG, CHOLHDL, LDLDIRECT in the last 72 hours. Thyroid function studies: No results for input(s): TSH, T4TOTAL, T3FREE, THYROIDAB in the last 72 hours.  Invalid input(s): FREET3 Anemia work up: No results for input(s): VITAMINB12, FOLATE,  FERRITIN, TIBC, IRON, RETICCTPCT in the last 72 hours. Sepsis Labs: Recent Labs  Lab 10/02/18 0618  WBC 4.6   Microbiology No results found for this or any previous visit (from the past 240 hour(s)).   Medications:   . atorvastatin  80 mg Oral QHS  . doxazosin  4 mg Oral Daily  . ezetimibe  10 mg Oral Daily  . feeding supplement (NEPRO CARB STEADY)  237 mL Oral BID BM  . fenofibrate  160 mg Oral Daily  . insulin aspart  0-5 Units Subcutaneous QHS  . insulin aspart  0-9 Units Subcutaneous TID WC  . loratadine  10 mg Oral QHS  . metoprolol succinate  25 mg Oral Daily  . nicotine  21 mg Transdermal QHS  . sodium bicarbonate  1,300 mg Oral BID   Continuous Infusions:     LOS: 10 days   Charlynne Cousins  [facility]  10/07/2018, 9:55 AM

## 2018-10-08 DIAGNOSIS — N19 Unspecified kidney failure: Secondary | ICD-10-CM

## 2018-10-08 LAB — GLUCOSE, CAPILLARY
Glucose-Capillary: 90 mg/dL (ref 70–99)
Glucose-Capillary: 91 mg/dL (ref 70–99)
Glucose-Capillary: 109 mg/dL — ABNORMAL HIGH (ref 70–99)
Glucose-Capillary: 112 mg/dL — ABNORMAL HIGH (ref 70–99)
Glucose-Capillary: 88 mg/dL (ref 70–99)

## 2018-10-08 LAB — URINALYSIS, ROUTINE W REFLEX MICROSCOPIC
Bilirubin Urine: NEGATIVE
Glucose, UA: 100 mg/dL — AB
Ketones, ur: NEGATIVE mg/dL
Nitrite: NEGATIVE
Protein, ur: >300 mg/dL — AB
Specific Gravity, Urine: 1.01 (ref 1.005–1.030)
pH: 7 (ref 5.0–8.0)

## 2018-10-08 LAB — CBC
HCT: 26.6 % — ABNORMAL LOW (ref 39.0–52.0)
Hemoglobin: 8.7 g/dL — ABNORMAL LOW (ref 13.0–17.0)
MCH: 27.4 pg (ref 26.0–34.0)
MCHC: 32.7 g/dL (ref 30.0–36.0)
MCV: 83.9 fL (ref 80.0–100.0)
Platelets: 115 10*3/uL — ABNORMAL LOW (ref 150–400)
RBC: 3.17 MIL/uL — ABNORMAL LOW (ref 4.22–5.81)
RDW: 18.3 % — ABNORMAL HIGH (ref 11.5–15.5)
WBC: 4.8 10*3/uL (ref 4.0–10.5)
nRBC: 0 % (ref 0.0–0.2)

## 2018-10-08 LAB — RENAL FUNCTION PANEL
Albumin: 1.6 g/dL — ABNORMAL LOW (ref 3.5–5.0)
Anion gap: 10 (ref 5–15)
BUN: 47 mg/dL — ABNORMAL HIGH (ref 8–23)
CO2: 25 mmol/L (ref 22–32)
Calcium: 7.6 mg/dL — ABNORMAL LOW (ref 8.9–10.3)
Chloride: 104 mmol/L (ref 98–111)
Creatinine, Ser: 5.23 mg/dL — ABNORMAL HIGH (ref 0.61–1.24)
GFR calc Af Amer: 12 mL/min — ABNORMAL LOW (ref >60)
GFR calc non Af Amer: 10 mL/min — ABNORMAL LOW (ref >60)
Glucose, Bld: 99 mg/dL (ref 70–99)
Phosphorus: 4.6 mg/dL (ref 2.5–4.6)
Potassium: 3.8 mmol/L (ref 3.5–5.1)
Sodium: 139 mmol/L (ref 135–145)

## 2018-10-08 LAB — URINALYSIS, MICROSCOPIC (REFLEX)
Bacteria, UA: NONE SEEN
RBC / HPF: >50 RBC/hpf (ref 0–5)
WBC, UA: NONE SEEN WBC/hpf (ref 0–5)

## 2018-10-08 MED ORDER — ANTICOAGULANT SODIUM CITRATE 4% (200MG/5ML) IV SOLN
5.0000 mL | Status: AC
  Administered 2018-10-08: 2.8 mL via INTRAVENOUS
  Filled 2018-10-08: qty 5

## 2018-10-08 MED ORDER — SODIUM CHLORIDE 0.9% FLUSH
10.0000 mL | INTRAVENOUS | Status: DC | PRN
  Administered 2018-10-12 – 2018-10-13 (×2): 10 mL
  Filled 2018-10-08 (×2): qty 40

## 2018-10-08 MED ORDER — EPINEPHRINE PF 1 MG/ML IJ SOLN: 0.3000 mg | INTRAMUSCULAR | Status: DC | PRN

## 2018-10-08 NOTE — Progress Notes (Signed)
Marland Kitchenj  [facility] Progress Note    Assessment/ Plan:   1 AKI/CKD3: He has had GI losses x 3 weeks, losartan, HCTZ and so the initial thinking was that he had ATN which seemed likely.  It would be quite a dense ATN, would have expected maybe some improvement by now.   Baseline cr 1.01. Nonoliguric.  SPEP with 0.8 M-spike UP/C with 2.5 protein.   ANA + with dsDNA at 52, C3 normal and C4 slightly low, C and P anca negative.  Repeat renal US negative and have discussed biopsy with pt and placed order--> given + serologic workup would be essential to investigate renal tissue to assess for reversibility of disease.  I stopped his order for Plavix 4/9, scheduled for biopsy 4/13.  Greatly appreciate IR. Given his persistent n/v, I think he is uremic and we will need to start dialysis (hopefully on a temporary basis) until we can obtain renal tissue for evaluation.  HD #1 yesterday, HD #2 today 4/11.  I am hesitant to start empiric immunosuppression given the history of bladder cancer; his hematuria could be GN mediated, will re-send UA (ordered again) and I'll see if I can spin urine myself (may not be possible to go into lab given COVID-19 restrictions but will see).      2.  Diarrhea: Resolved  3 Anemia: CBC stable 8.5, will order for tomorrow.  Iron sats 15%, got feraheme x 2.     4 COPD/smoking-- no cigarettes in 10 days  5 CAD- on metoprolol and Plavix, stopped 4/9.  6 Hematuria/bladder Ca--> discussion as above.   7.  Metabolic acidosis: on bicarb 1300 mg BID   8.  Alpha-gal allergy: cannot have heparin in dialysis, need sodium citrate locks for HD catheter  9.  Dispo: pending    Appetite much better after HD.  Ate all his [place] toast this AM.       Objective:   BP (!) 152/84 (BP Location: Left Arm)   Pulse 100   Temp 98.2 F (36.8 C) (Oral)   Resp 20   Ht 5\' 10"  (1.778 m)   Wt 87 kg   SpO2 94%   BMI 27.52 kg/m   Intake/Output Summary (Last 24 hours) at 10/08/2018  1119 Last data filed at 10/08/2018 0836 Gross per 24 hour  Intake 920 ml  Output 1250 ml  Net -330 ml   Weight change: -4.5 kg  Physical Exam:  Gen: older gentleman, NAD, lying in bed with the lights off as usual CVS: RRR, no m/r/g Resp: clear bilaterally no c/w/r Abd: hyperactive BS Ext: no LE edema NEURO: AAO x 3, no asterixis  Imaging: US Renal  Result Date: 10/06/2018 CLINICAL DATA:  Acute kidney injury. EXAM: RENAL / URINARY TRACT ULTRASOUND COMPLETE COMPARISON:  09/27/2018 FINDINGS: Right Kidney: Renal measurements: 11.0 x 6.6 x 6.5 cm = volume: 245 mL. Mildly increased parenchymal echogenicity. No mass or hydronephrosis visualized. Left Kidney: Renal measurements: 12.1 x 5.9 x 5.3 cm = volume: 199 mL. Borderline increased parenchymal echogenicity. Small upper pole and interpolar cysts measuring 8 mm and 11 mm, respectively. No hydronephrosis. Bladder: Appears normal for degree of bladder distention. IMPRESSION: 1. No hydronephrosis. 2. Mildly echogenic kidneys compatible with medical renal disease. Electronically Signed   By: Logan Bores M.D.   On: 10/06/2018 11:40   Ir Fluoro Guide Cv Line Right  Result Date: 10/07/2018 CLINICAL DATA:  Renal failure, needs venous access for hemodialysis EXAM: EXAM RIGHT IJ CATHETER PLACEMENT  UNDER ULTRASOUND AND FLUOROSCOPIC GUIDANCE TECHNIQUE: The procedure, risks (including but not limited to bleeding, infection, organ damage, pneumothorax), benefits, and alternatives were explained to the patient. Questions regarding the procedure were encouraged and answered. The patient understands and consents to the procedure. Patency of the right IJ vein was confirmed with ultrasound with image documentation. An appropriate skin site was determined. Skin site was marked. Region was prepped using maximum barrier technique including cap and mask, sterile gown, sterile gloves, large sterile sheet, and Chlorhexidine as cutaneous antisepsis. The region was  infiltrated locally with 1% lidocaine. Under real-time ultrasound guidance, the right IJ vein was accessed with a 21 gauge needle; the needle tip within the vein was confirmed with ultrasound image documentation. The needle exchanged over a 018 guidewire for vascular dilator which allowed advancement of a 20 cm Mahurkar catheter. This was positioned with the tip at the cavoatrial junction. Spot chest radiograph shows good positioning and no pneumothorax. Catheter was flushed and sutured externally with 0-Prolene sutures. Patient tolerated the procedure well. FLUOROSCOPY TIME:  Less than 0.1 minute; 15 uGym2 DAP COMPLICATIONS: COMPLICATIONS none IMPRESSION: 1. Technically successful right IJ Mahurkar catheter placement. Electronically Signed   By: Lucrezia Europe M.D.   On: 10/07/2018 13:07   Ir US Guide Vasc Access Right  Result Date: 10/07/2018 CLINICAL DATA:  Renal failure, needs venous access for hemodialysis EXAM: EXAM RIGHT IJ CATHETER PLACEMENT UNDER ULTRASOUND AND FLUOROSCOPIC GUIDANCE TECHNIQUE: The procedure, risks (including but not limited to bleeding, infection, organ damage, pneumothorax), benefits, and alternatives were explained to the patient. Questions regarding the procedure were encouraged and answered. The patient understands and consents to the procedure. Patency of the right IJ vein was confirmed with ultrasound with image documentation. An appropriate skin site was determined. Skin site was marked. Region was prepped using maximum barrier technique including cap and mask, sterile gown, sterile gloves, large sterile sheet, and Chlorhexidine as cutaneous antisepsis. The region was infiltrated locally with 1% lidocaine. Under real-time ultrasound guidance, the right IJ vein was accessed with a 21 gauge needle; the needle tip within the vein was confirmed with ultrasound image documentation. The needle exchanged over a 018 guidewire for vascular dilator which allowed advancement of a 20 cm Mahurkar  catheter. This was positioned with the tip at the cavoatrial junction. Spot chest radiograph shows good positioning and no pneumothorax. Catheter was flushed and sutured externally with 0-Prolene sutures. Patient tolerated the procedure well. FLUOROSCOPY TIME:  Less than 0.1 minute; 15 uGym2 DAP COMPLICATIONS: COMPLICATIONS none IMPRESSION: 1. Technically successful right IJ Mahurkar catheter placement. Electronically Signed   By: Lucrezia Europe M.D.   On: 10/07/2018 13:07    Labs: BMET Recent Labs  Lab 10/02/18 0618 10/03/18 0531 10/04/18 0602 10/05/18 0544 10/06/18 0615 10/07/18 0653 10/08/18 0310  NA 135 134* 138 135 136 137 139  K 3.8 3.5 3.8 3.5 3.6 3.5 3.8  CL 104 106 106 104 104 107 104  CO2 21* 19* 22 20* 22 22 25   GLUCOSE 94 91 90 96 90 99 99  BUN 64* 64* 67* 71* 70* 72* 47*  CREATININE 5.59* 5.63* 6.01* 6.03* 6.47* 6.53* 5.23*  CALCIUM 7.8* 7.5* 7.9* 7.6* 7.4* 7.4* 7.6*  PHOS 5.4* 5.3* 5.9* 5.9* 6.0* 6.0* 4.6   CBC Recent Labs  Lab 10/02/18 0618 10/08/18 0310  WBC 4.6 4.8  HGB 8.8* 8.7*  HCT 26.7* 26.6*  MCV 85.3 83.9  PLT 125* 115*    Medications:    . atorvastatin  80  mg Oral QHS  . calcium acetate  667 mg Oral TID WC  . Chlorhexidine Gluconate Cloth  6 each Topical Q0600  . doxazosin  4 mg Oral Daily  . ezetimibe  10 mg Oral Daily  . feeding supplement (NEPRO CARB STEADY)  237 mL Oral BID BM  . fenofibrate  160 mg Oral Daily  . insulin aspart  0-5 Units Subcutaneous QHS  . insulin aspart  0-9 Units Subcutaneous TID WC  . loratadine  10 mg Oral QHS  . metoprolol succinate  25 mg Oral Daily  . nicotine  21 mg Transdermal QHS  . sodium bicarbonate  1,300 mg Oral BID      Madelon Lips, MD [facility] [phone] 10/08/2018, 11:19 AM

## 2018-10-08 NOTE — Progress Notes (Signed)
[facility] PROGRESS NOTE    Progress Note  [name] Sloop.  HFW:[id number] DOB: [birth year] DOA: 09/27/2018 PCP: [name], MD     Brief Narrative:   [name]. is an 73 y.o. male past medical history of diabetes mellitus, hypertension COPD history of bladder cancer was sent to the hospital from her primary care doctor's office for worsening renal failure, he relates bouts of diarrhea for the past 3 weeks. Urinalysis on admission showed 3+ proteinuria with hematuria nephrology was consulted  Assessment/Plan:   Acute renal failure (ARF) ([place]) uremia: Baseline creatinine around 1 back in 07/19/2018 His creatinine is slightly increased over the last several days,  SPEP showed an M spike, crease and C4 complement so nephrology is recommended renal biopsy. Discontinue Plavix. Status post tunneled catheter for HD placed on 10/08/2018. Status post his first dialysis on 10/08/2018, he relates some malaise and bad taste in his mouth after dialysis. This morning he said he felt good tolerating his diet minor good mood and hungry.  Hyperphosphatemia: We will start him on PhosLo 3 times daily.  Continue to follow phosphorus closely.  History of bladder cancer: He has been encouraged strongly to follow-up with urology as an outpatient.  Diabetes mellitus type 2: Still uncontrolled continue monitor blood glucose.  Essential Hypretension: Continue metoprolol.  Coronary artery disease: Stable continue Lipitor, currently asymptomatic hold Plavix for possible renal biopsy.  Hyperlipidemia: Continue current home medications.  Anemia of chronic kidney disease: Hemoglobin stable.   DVT prophylaxis: SCD's Family Communication:none Disposition Plan/Barrier to D/C: Once acute renal failure work-up completed. Code Status:     Code Status Orders  (From admission, onward)         Start     Ordered   09/27/18 1521  Full code  Continuous     09/27/18 1521         Code Status History    Date Active Date Inactive Code Status Order ID Comments User Context   06/10/2018 1824 06/11/2018 1333 Full Code [id number]  [name], MD Inpatient   12/12/2014 1139 12/13/2014 0324 Full Code [id number]  Nelson Chimes, MD HOV   10/02/2013 1137 10/02/2013 1932 Partial Code [id number]  Sol Passer, MD ED        IV Access:    Peripheral IV   Procedures and diagnostic studies:   US Renal  Result Date: 10/06/2018 CLINICAL DATA:  Acute kidney injury. EXAM: RENAL / URINARY TRACT ULTRASOUND COMPLETE COMPARISON:  09/27/2018 FINDINGS: Right Kidney: Renal measurements: 11.0 x 6.6 x 6.5 cm = volume: 245 mL. Mildly increased parenchymal echogenicity. No mass or hydronephrosis visualized. Left Kidney: Renal measurements: 12.1 x 5.9 x 5.3 cm = volume: 199 mL. Borderline increased parenchymal echogenicity. Small upper pole and interpolar cysts measuring 8 mm and 11 mm, respectively. No hydronephrosis. Bladder: Appears normal for degree of bladder distention. IMPRESSION: 1. No hydronephrosis. 2. Mildly echogenic kidneys compatible with medical renal disease. Electronically Signed   By: Logan Bores M.D.   On: 10/06/2018 11:40   Ir Fluoro Guide Cv Line Right  Result Date: 10/07/2018 CLINICAL DATA:  Renal failure, needs venous access for hemodialysis EXAM: EXAM RIGHT IJ CATHETER PLACEMENT UNDER ULTRASOUND AND FLUOROSCOPIC GUIDANCE TECHNIQUE: The procedure, risks (including but not limited to bleeding, infection, organ damage, pneumothorax), benefits, and alternatives were explained to the patient. Questions regarding the procedure were encouraged and answered. The patient understands and consents to the procedure. Patency of the right IJ vein was confirmed with ultrasound with image  documentation. An appropriate skin site was determined. Skin site was marked. Region was prepped using maximum barrier technique including cap and mask, sterile gown, sterile gloves, large sterile sheet, and  Chlorhexidine as cutaneous antisepsis. The region was infiltrated locally with 1% lidocaine. Under real-time ultrasound guidance, the right IJ vein was accessed with a 21 gauge needle; the needle tip within the vein was confirmed with ultrasound image documentation. The needle exchanged over a 018 guidewire for vascular dilator which allowed advancement of a 20 cm Mahurkar catheter. This was positioned with the tip at the cavoatrial junction. Spot chest radiograph shows good positioning and no pneumothorax. Catheter was flushed and sutured externally with 0-Prolene sutures. Patient tolerated the procedure well. FLUOROSCOPY TIME:  Less than 0.1 minute; 15 uGym2 DAP COMPLICATIONS: COMPLICATIONS none IMPRESSION: 1. Technically successful right IJ Mahurkar catheter placement. Electronically Signed   By: Lucrezia Europe M.D.   On: 10/07/2018 13:07   Ir US Guide Vasc Access Right  Result Date: 10/07/2018 CLINICAL DATA:  Renal failure, needs venous access for hemodialysis EXAM: EXAM RIGHT IJ CATHETER PLACEMENT UNDER ULTRASOUND AND FLUOROSCOPIC GUIDANCE TECHNIQUE: The procedure, risks (including but not limited to bleeding, infection, organ damage, pneumothorax), benefits, and alternatives were explained to the patient. Questions regarding the procedure were encouraged and answered. The patient understands and consents to the procedure. Patency of the right IJ vein was confirmed with ultrasound with image documentation. An appropriate skin site was determined. Skin site was marked. Region was prepped using maximum barrier technique including cap and mask, sterile gown, sterile gloves, large sterile sheet, and Chlorhexidine as cutaneous antisepsis. The region was infiltrated locally with 1% lidocaine. Under real-time ultrasound guidance, the right IJ vein was accessed with a 21 gauge needle; the needle tip within the vein was confirmed with ultrasound image documentation. The needle exchanged over a 018 guidewire for vascular  dilator which allowed advancement of a 20 cm Mahurkar catheter. This was positioned with the tip at the cavoatrial junction. Spot chest radiograph shows good positioning and no pneumothorax. Catheter was flushed and sutured externally with 0-Prolene sutures. Patient tolerated the procedure well. FLUOROSCOPY TIME:  Less than 0.1 minute; 15 uGym2 DAP COMPLICATIONS: COMPLICATIONS none IMPRESSION: 1. Technically successful right IJ Mahurkar catheter placement. Electronically Signed   By: Lucrezia Europe M.D.   On: 10/07/2018 13:07     Medical Consultants:    None.  Anti-Infectives:   NOne  Subjective:    [name] Sloop. he relates his appetite has returned today, he ate all his breakfast is in a good mood and would like to continue to eat.  Objective:    Vitals:   10/07/18 2230 10/07/18 2250 10/08/18 0503 10/08/18 0837  BP: (!) 145/87 (!) 152/86 (!) 151/83 (!) 152/84  Pulse: 93 93 98 100  Resp: 18 18 (!) 21 20  Temp:  98.2 F (36.8 C) 98.2 F (36.8 C) 98.2 F (36.8 C)  TempSrc:  Oral Oral Oral  SpO2:  96% 96% 94%  Weight:  87 kg    Height:        Intake/Output Summary (Last 24 hours) at 10/08/2018 0844 Last data filed at 10/08/2018 0836 Gross per 24 hour  Intake 1020 ml  Output 1350 ml  Net -330 ml   Filed Weights   10/06/18 1953 10/07/18 2010 10/07/18 2250  Weight: 91.5 kg 87 kg 87 kg    Exam: General exam: In no acute distress. Respiratory system: Good air movement and clear to auscultation. Cardiovascular system: S1 &  S2 heard, RRR. Gastrointestinal system: Abdomen is nondistended, soft and nontender.  Central nervous system: Alert and oriented. No focal neurological deficits. Extremities: No pedal edema. Skin: No rashes, lesions or ulcers Psychiatry: Judgement and insight appear normal. Mood & affect appropriate.    Data Reviewed:    Labs: Basic Metabolic Panel: Recent Labs  Lab 10/04/18 0602 10/05/18 0544 10/06/18 0615 10/07/18 0653 10/08/18 0310  NA  138 135 136 137 139  K 3.8 3.5 3.6 3.5 3.8  CL 106 104 104 107 104  CO2 22 20* 22 22 25   GLUCOSE 90 96 90 99 99  BUN 67* 71* 70* 72* 47*  CREATININE 6.01* 6.03* 6.47* 6.53* 5.23*  CALCIUM 7.9* 7.6* 7.4* 7.4* 7.6*  PHOS 5.9* 5.9* 6.0* 6.0* 4.6   GFR Estimated Creatinine Clearance: 12.8 mL/min (A) (by C-G formula based on SCr of 5.23 mg/dL (H)). Liver Function Tests: Recent Labs  Lab 10/04/18 0602 10/05/18 0544 10/06/18 0615 10/07/18 0653 10/08/18 0310  ALBUMIN 1.7* 1.5* 1.6* 1.6* 1.6*   No results for input(s): LIPASE, AMYLASE in the last 168 hours. No results for input(s): AMMONIA in the last 168 hours. Coagulation profile Recent Labs  Lab 10/06/18 1152  INR 1.4*    CBC: Recent Labs  Lab 10/02/18 0618 10/08/18 0310  WBC 4.6 4.8  HGB 8.8* 8.7*  HCT 26.7* 26.6*  MCV 85.3 83.9  PLT 125* 115*   Cardiac Enzymes: No results for input(s): CKTOTAL, CKMB, CKMBINDEX, TROPONINI in the last 168 hours. BNP (last 3 results) No results for input(s): PROBNP in the last 8760 hours. CBG: Recent Labs  Lab 10/07/18 0438 10/07/18 1637 10/07/18 2007 10/08/18 0504 10/08/18 0809  GLUCAP 97 105* 111* 90 91   D-Dimer: No results for input(s): DDIMER in the last 72 hours. Hgb A1c: No results for input(s): HGBA1C in the last 72 hours. Lipid Profile: No results for input(s): CHOL, HDL, LDLCALC, TRIG, CHOLHDL, LDLDIRECT in the last 72 hours. Thyroid function studies: No results for input(s): TSH, T4TOTAL, T3FREE, THYROIDAB in the last 72 hours.  Invalid input(s): FREET3 Anemia work up: No results for input(s): VITAMINB12, FOLATE, FERRITIN, TIBC, IRON, RETICCTPCT in the last 72 hours. Sepsis Labs: Recent Labs  Lab 10/02/18 0618 10/08/18 0310  WBC 4.6 4.8   Microbiology No results found for this or any previous visit (from the past 240 hour(s)).   Medications:   . atorvastatin  80 mg Oral QHS  . calcium acetate  667 mg Oral TID WC  . Chlorhexidine Gluconate Cloth  6  each Topical Q0600  . doxazosin  4 mg Oral Daily  . ezetimibe  10 mg Oral Daily  . feeding supplement (NEPRO CARB STEADY)  237 mL Oral BID BM  . fenofibrate  160 mg Oral Daily  . insulin aspart  0-5 Units Subcutaneous QHS  . insulin aspart  0-9 Units Subcutaneous TID WC  . loratadine  10 mg Oral QHS  . metoprolol succinate  25 mg Oral Daily  . nicotine  21 mg Transdermal QHS  . sodium bicarbonate  1,300 mg Oral BID   Continuous Infusions: . sodium chloride    . sodium chloride        LOS: 11 days   Charlynne Cousins  [facility]  10/08/2018, 8:44 AM

## 2018-10-08 NOTE — Progress Notes (Signed)
Patient ID: [name]., male   DOB: 09/01/1945, 73 y.o.   MRN: [id number]   Scheduled for random renal biopsy in IR 4/13 Plavix on HOLD LD 10/05/18  We will call for pt 4/13 when can IR PA will see pt and consent pt in IR

## 2018-10-09 DIAGNOSIS — E119 Type 2 diabetes mellitus without complications: Secondary | ICD-10-CM

## 2018-10-09 DIAGNOSIS — Z794 Long term (current) use of insulin: Secondary | ICD-10-CM

## 2018-10-09 LAB — RENAL FUNCTION PANEL
Albumin: 1.5 g/dL — ABNORMAL LOW (ref 3.5–5.0)
Anion gap: 9 (ref 5–15)
BUN: 29 mg/dL — ABNORMAL HIGH (ref 8–23)
CO2: 26 mmol/L (ref 22–32)
Calcium: 7.4 mg/dL — ABNORMAL LOW (ref 8.9–10.3)
Chloride: 101 mmol/L (ref 98–111)
Creatinine, Ser: 4.18 mg/dL — ABNORMAL HIGH (ref 0.61–1.24)
GFR calc Af Amer: 15 mL/min — ABNORMAL LOW (ref >60)
GFR calc non Af Amer: 13 mL/min — ABNORMAL LOW (ref >60)
Glucose, Bld: 82 mg/dL (ref 70–99)
Phosphorus: 3.3 mg/dL (ref 2.5–4.6)
Potassium: 3.6 mmol/L (ref 3.5–5.1)
Sodium: 136 mmol/L (ref 135–145)

## 2018-10-09 LAB — GLUCOSE, CAPILLARY
Glucose-Capillary: 84 mg/dL (ref 70–99)
Glucose-Capillary: 95 mg/dL (ref 70–99)
Glucose-Capillary: 106 mg/dL — ABNORMAL HIGH (ref 70–99)
Glucose-Capillary: 111 mg/dL — ABNORMAL HIGH (ref 70–99)

## 2018-10-09 LAB — CBC
HCT: 24.2 % — ABNORMAL LOW (ref 39.0–52.0)
Hemoglobin: 7.9 g/dL — ABNORMAL LOW (ref 13.0–17.0)
MCH: 27.9 pg (ref 26.0–34.0)
MCHC: 32.6 g/dL (ref 30.0–36.0)
MCV: 85.5 fL (ref 80.0–100.0)
Platelets: 113 10*3/uL — ABNORMAL LOW (ref 150–400)
RBC: 2.83 MIL/uL — ABNORMAL LOW (ref 4.22–5.81)
RDW: 18.6 % — ABNORMAL HIGH (ref 11.5–15.5)
WBC: 4.7 10*3/uL (ref 4.0–10.5)
nRBC: 0 % (ref 0.0–0.2)

## 2018-10-09 MED ORDER — DARBEPOETIN ALFA 100 MCG/0.5ML IJ SOSY
100.0000 ug | PREFILLED_SYRINGE | INTRAMUSCULAR | Status: DC
  Administered 2018-10-09 – 2018-10-16 (×2): 100 ug via SUBCUTANEOUS
  Filled 2018-10-09 (×2): qty 0.5

## 2018-10-09 MED ORDER — METOPROLOL SUCCINATE ER 50 MG PO TB24
50.0000 mg | ORAL_TABLET | Freq: Every day | ORAL | Status: DC
  Administered 2018-10-09 – 2018-10-19 (×10): 50 mg via ORAL
  Filled 2018-10-09 (×11): qty 1

## 2018-10-09 NOTE — Progress Notes (Signed)
[facility] PROGRESS NOTE    Progress Note  [name].  OEV:[id number] DOB: 02/06/1946 DOA: 09/27/2018 PCP: [name], MD     Brief Narrative:   [name]. is an 73 y.o. male past medical history of diabetes mellitus, hypertension COPD history of bladder cancer was sent to the hospital from her primary care doctor's office for worsening renal failure, he relates bouts of diarrhea for the past 3 weeks. Urinalysis on admission showed 3+ proteinuria with hematuria nephrology was consulted  Assessment/Plan:   Acute renal failure (ARF) ([place]) uremia: Baseline creatinine around 1 back in 07/19/2018 SPEP showed an M spike, crease and C4 complement so nephrology is recommended renal biopsy. ANA positive with a double-stranded DNA that was 52 Discontinue Plavix for 03/19/2019. Status post tunneled catheter for HD placed on 10/08/2018. Continue HD per renal first treatment on 10/07/2018 and second treatment on 10/08/2018.Marland Kitchen For biopsy on 10/10/2018  Hyperphosphatemia: We will start him on PhosLo 3 times daily.  Continue to follow phosphorus closely.  History of bladder cancer: He has been encouraged strongly to follow-up with urology as an outpatient.  Diabetes mellitus type 2: Still uncontrolled continue monitor blood glucose.  Essential Hypretension: Continue metoprolol.  Coronary artery disease: Stable continue Lipitor, currently asymptomatic hold Plavix for possible renal biopsy.  Hyperlipidemia: Continue current home medications.  Anemia of chronic kidney disease: Hemoglobin stable.   DVT prophylaxis: SCD's Family Communication:none Disposition Plan/Barrier to D/C: Once acute renal failure work-up completed. Code Status:     Code Status Orders  (From admission, onward)         Start     Ordered   09/27/18 1521  Full code  Continuous     09/27/18 1521        Code Status History    Date Active Date Inactive Code Status Order ID Comments  User Context   06/10/2018 1824 06/11/2018 1333 Full Code [id number]  [name], MD Inpatient   12/12/2014 1139 12/13/2014 0324 Full Code [id number]  Nelson Chimes, MD HOV   10/02/2013 1137 10/02/2013 1932 Partial Code [id number]  Sol Passer, MD ED        IV Access:    Peripheral IV   Procedures and diagnostic studies:   Ir Fluoro Guide Cv Line Right  Result Date: 10/07/2018 CLINICAL DATA:  Renal failure, needs venous access for hemodialysis EXAM: EXAM RIGHT IJ CATHETER PLACEMENT UNDER ULTRASOUND AND FLUOROSCOPIC GUIDANCE TECHNIQUE: The procedure, risks (including but not limited to bleeding, infection, organ damage, pneumothorax), benefits, and alternatives were explained to the patient. Questions regarding the procedure were encouraged and answered. The patient understands and consents to the procedure. Patency of the right IJ vein was confirmed with ultrasound with image documentation. An appropriate skin site was determined. Skin site was marked. Region was prepped using maximum barrier technique including cap and mask, sterile gown, sterile gloves, large sterile sheet, and Chlorhexidine as cutaneous antisepsis. The region was infiltrated locally with 1% lidocaine. Under real-time ultrasound guidance, the right IJ vein was accessed with a 21 gauge needle; the needle tip within the vein was confirmed with ultrasound image documentation. The needle exchanged over a 018 guidewire for vascular dilator which allowed advancement of a 20 cm Mahurkar catheter. This was positioned with the tip at the cavoatrial junction. Spot chest radiograph shows good positioning and no pneumothorax. Catheter was flushed and sutured externally with 0-Prolene sutures. Patient tolerated the procedure well. FLUOROSCOPY TIME:  Less than 0.1 minute; 15 uGym2 DAP COMPLICATIONS: COMPLICATIONS  none IMPRESSION: 1. Technically successful right IJ Mahurkar catheter placement. Electronically Signed   By: Lucrezia Europe M.D.   On:  10/07/2018 13:07   Ir US Guide Vasc Access Right  Result Date: 10/07/2018 CLINICAL DATA:  Renal failure, needs venous access for hemodialysis EXAM: EXAM RIGHT IJ CATHETER PLACEMENT UNDER ULTRASOUND AND FLUOROSCOPIC GUIDANCE TECHNIQUE: The procedure, risks (including but not limited to bleeding, infection, organ damage, pneumothorax), benefits, and alternatives were explained to the patient. Questions regarding the procedure were encouraged and answered. The patient understands and consents to the procedure. Patency of the right IJ vein was confirmed with ultrasound with image documentation. An appropriate skin site was determined. Skin site was marked. Region was prepped using maximum barrier technique including cap and mask, sterile gown, sterile gloves, large sterile sheet, and Chlorhexidine as cutaneous antisepsis. The region was infiltrated locally with 1% lidocaine. Under real-time ultrasound guidance, the right IJ vein was accessed with a 21 gauge needle; the needle tip within the vein was confirmed with ultrasound image documentation. The needle exchanged over a 018 guidewire for vascular dilator which allowed advancement of a 20 cm Mahurkar catheter. This was positioned with the tip at the cavoatrial junction. Spot chest radiograph shows good positioning and no pneumothorax. Catheter was flushed and sutured externally with 0-Prolene sutures. Patient tolerated the procedure well. FLUOROSCOPY TIME:  Less than 0.1 minute; 15 uGym2 DAP COMPLICATIONS: COMPLICATIONS none IMPRESSION: 1. Technically successful right IJ Mahurkar catheter placement. Electronically Signed   By: Lucrezia Europe M.D.   On: 10/07/2018 13:07     Medical Consultants:    None.  Anti-Infectives:   None  Subjective:    [name]. he relates the food did not taste it as well as it did 2 days ago, he did had an episode of vomiting after dialysis.  Objective:    Vitals:   10/08/18 2110 10/08/18 2132 10/09/18 0356  10/09/18 0824  BP: (!) 145/76 113/75 (!) 150/84 133/79  Pulse: 89 91 (!) 110 (!) 102  Resp: 16 20 19 19   Temp: 98.8 F (37.1 C) 98.3 F (36.8 C) 98.3 F (36.8 C) 98.3 F (36.8 C)  TempSrc: Oral Oral Oral Oral  SpO2: 93% 97% 97% 95%  Weight: 87 kg 87.9 kg    Height:        Intake/Output Summary (Last 24 hours) at 10/09/2018 0924 Last data filed at 10/09/2018 0823 Gross per 24 hour  Intake 720 ml  Output 170 ml  Net 550 ml   Filed Weights   10/08/18 1830 10/08/18 2110 10/08/18 2132  Weight: 87.4 kg 87 kg 87.9 kg    Exam: General exam: In no acute distress. Respiratory system: Good air movement and clear to auscultation. Cardiovascular system: S1 & S2 heard, RRR. Gastrointestinal system: Abdomen is nondistended, soft and nontender.  Central nervous system: Alert and oriented. No focal neurological deficits. Extremities: No pedal edema. Skin: No rashes, lesions or ulcers Psychiatry: Judgement and insight appear normal. Mood & affect appropriate.    Data Reviewed:    Labs: Basic Metabolic Panel: Recent Labs  Lab 10/05/18 0544 10/06/18 0615 10/07/18 0653 10/08/18 0310 10/09/18 0254  NA 135 136 137 139 136  K 3.5 3.6 3.5 3.8 3.6  CL 104 104 107 104 101  CO2 20* 22 22 25 26   GLUCOSE 96 90 99 99 82  BUN 71* 70* 72* 47* 29*  CREATININE 6.03* 6.47* 6.53* 5.23* 4.18*  CALCIUM 7.6* 7.4* 7.4* 7.6* 7.4*  PHOS 5.9* 6.0*  6.0* 4.6 3.3   GFR Estimated Creatinine Clearance: 17.3 mL/min (A) (by C-G formula based on SCr of 4.18 mg/dL (H)). Liver Function Tests: Recent Labs  Lab 10/05/18 0544 10/06/18 0615 10/07/18 0653 10/08/18 0310 10/09/18 0254  ALBUMIN 1.5* 1.6* 1.6* 1.6* 1.5*   No results for input(s): LIPASE, AMYLASE in the last 168 hours. No results for input(s): AMMONIA in the last 168 hours. Coagulation profile Recent Labs  Lab 10/06/18 1152  INR 1.4*    CBC: Recent Labs  Lab 10/08/18 0310 10/09/18 0254  WBC 4.8 4.7  HGB 8.7* 7.9*  HCT 26.6*  24.2*  MCV 83.9 85.5  PLT 115* 113*   Cardiac Enzymes: No results for input(s): CKTOTAL, CKMB, CKMBINDEX, TROPONINI in the last 168 hours. BNP (last 3 results) No results for input(s): PROBNP in the last 8760 hours. CBG: Recent Labs  Lab 10/08/18 0809 10/08/18 1131 10/08/18 1646 10/08/18 2133 10/09/18 0711  GLUCAP 91 109* 112* 88 84   D-Dimer: No results for input(s): DDIMER in the last 72 hours. Hgb A1c: No results for input(s): HGBA1C in the last 72 hours. Lipid Profile: No results for input(s): CHOL, HDL, LDLCALC, TRIG, CHOLHDL, LDLDIRECT in the last 72 hours. Thyroid function studies: No results for input(s): TSH, T4TOTAL, T3FREE, THYROIDAB in the last 72 hours.  Invalid input(s): FREET3 Anemia work up: No results for input(s): VITAMINB12, FOLATE, FERRITIN, TIBC, IRON, RETICCTPCT in the last 72 hours. Sepsis Labs: Recent Labs  Lab 10/08/18 0310 10/09/18 0254  WBC 4.8 4.7   Microbiology No results found for this or any previous visit (from the past 240 hour(s)).   Medications:    atorvastatin  80 mg Oral QHS   calcium acetate  667 mg Oral TID WC   Chlorhexidine Gluconate Cloth  6 each Topical Q0600   doxazosin  4 mg Oral Daily   ezetimibe  10 mg Oral Daily   feeding supplement (NEPRO CARB STEADY)  237 mL Oral BID BM   fenofibrate  160 mg Oral Daily   insulin aspart  0-5 Units Subcutaneous QHS   insulin aspart  0-9 Units Subcutaneous TID WC   loratadine  10 mg Oral QHS   metoprolol succinate  25 mg Oral Daily   nicotine  21 mg Transdermal QHS   sodium bicarbonate  1,300 mg Oral BID   Continuous Infusions:  sodium chloride     sodium chloride        LOS: 12 days   Charlynne Cousins  [facility]  10/09/2018, 9:24 AM

## 2018-10-09 NOTE — Progress Notes (Addendum)
Marland Kitchenj  [facility] Progress Note    Assessment/ Plan:   1 AKI/CKD3: He has had GI losses x 3 weeks, losartan, HCTZ and so the initial thinking was that he had ATN which seemed likely.  It would be quite a dense ATN, would have expected maybe some improvement by now.   Baseline cr 1.01. Nonoliguric.  SPEP with 0.8 M-spike UP/C with 2.5 protein.   ANA + with dsDNA at 52, C3 normal and C4 slightly low, C and P anca negative.  Repeat renal US negative and have discussed biopsy with pt and placed order--> given + serologic workup would be essential to investigate renal tissue to assess for reversibility of disease.  I stopped his order for Plavix 4/9, scheduled for biopsy 4/13.  Greatly appreciate IR. Given his persistent n/v, I think he is uremic and we will need to start dialysis (hopefully on a temporary basis) until we can obtain renal tissue for evaluation.  HD #1 4/10 HD #2 4/11.  I am hesitant to start empiric immunosuppression given the history of bladder cancer; his hematuria could be GN mediated, UA with persistent hematuria, I'll see if I can spin urine myself (may not be possible to go into lab given COVID-19 restrictions but will see).  HD #3 tomorrow around biopsy      2.  Diarrhea: Resolved  3 Anemia: CBC stable 7.9, will order for tomorrow.  Iron sats 15%, got feraheme x 2.     4 COPD/smoking-- no cigarettes since hospitalization  5 CAD- on metoprolol and Plavix, stopped 4/9.  6 Hematuria/bladder Ca--> discussion as above.  May need urologic evaluation sooner rather than later if we find something treatable on biopsy.  7.  Metabolic acidosis: on bicarb 1300 mg BID   8.  Alpha-gal allergy: cannot have heparin in dialysis, need sodium citrate locks for HD catheter  9.  Dispo: pending    HD #2 yesterday.  For renal biopsy tomorrow.        Objective:   BP 133/79 (BP Location: Left Arm)   Pulse (!) 102   Temp 98.3 F (36.8 C) (Oral)   Resp 19   Ht 5\' 10"  (1.778 m)    Wt 87.9 kg   SpO2 95%   BMI 27.81 kg/m   Intake/Output Summary (Last 24 hours) at 10/09/2018 1140 Last data filed at 10/09/2018 3419 Gross per 24 hour  Intake 720 ml  Output 50 ml  Net 670 ml   Weight change: 0.4 kg  Physical Exam:  Gen: older gentleman, NAD, lying in bed with the lights off as usual CVS: RRR, no m/r/g Resp: clear bilaterally no c/w/r Abd: hyperactive BS Ext: no LE edema NEURO: AAO x 3, no asterixis  Imaging: Ir Fluoro Guide Cv Line Right  Result Date: 10/07/2018 CLINICAL DATA:  Renal failure, needs venous access for hemodialysis EXAM: EXAM RIGHT IJ CATHETER PLACEMENT UNDER ULTRASOUND AND FLUOROSCOPIC GUIDANCE TECHNIQUE: The procedure, risks (including but not limited to bleeding, infection, organ damage, pneumothorax), benefits, and alternatives were explained to the patient. Questions regarding the procedure were encouraged and answered. The patient understands and consents to the procedure. Patency of the right IJ vein was confirmed with ultrasound with image documentation. An appropriate skin site was determined. Skin site was marked. Region was prepped using maximum barrier technique including cap and mask, sterile gown, sterile gloves, large sterile sheet, and Chlorhexidine as cutaneous antisepsis. The region was infiltrated locally with 1% lidocaine. Under real-time ultrasound guidance, the right  IJ vein was accessed with a 21 gauge needle; the needle tip within the vein was confirmed with ultrasound image documentation. The needle exchanged over a 018 guidewire for vascular dilator which allowed advancement of a 20 cm Mahurkar catheter. This was positioned with the tip at the cavoatrial junction. Spot chest radiograph shows good positioning and no pneumothorax. Catheter was flushed and sutured externally with 0-Prolene sutures. Patient tolerated the procedure well. FLUOROSCOPY TIME:  Less than 0.1 minute; 15 uGym2 DAP COMPLICATIONS: COMPLICATIONS none IMPRESSION: 1.  Technically successful right IJ Mahurkar catheter placement. Electronically Signed   By: Lucrezia Europe M.D.   On: 10/07/2018 13:07   Ir US Guide Vasc Access Right  Result Date: 10/07/2018 CLINICAL DATA:  Renal failure, needs venous access for hemodialysis EXAM: EXAM RIGHT IJ CATHETER PLACEMENT UNDER ULTRASOUND AND FLUOROSCOPIC GUIDANCE TECHNIQUE: The procedure, risks (including but not limited to bleeding, infection, organ damage, pneumothorax), benefits, and alternatives were explained to the patient. Questions regarding the procedure were encouraged and answered. The patient understands and consents to the procedure. Patency of the right IJ vein was confirmed with ultrasound with image documentation. An appropriate skin site was determined. Skin site was marked. Region was prepped using maximum barrier technique including cap and mask, sterile gown, sterile gloves, large sterile sheet, and Chlorhexidine as cutaneous antisepsis. The region was infiltrated locally with 1% lidocaine. Under real-time ultrasound guidance, the right IJ vein was accessed with a 21 gauge needle; the needle tip within the vein was confirmed with ultrasound image documentation. The needle exchanged over a 018 guidewire for vascular dilator which allowed advancement of a 20 cm Mahurkar catheter. This was positioned with the tip at the cavoatrial junction. Spot chest radiograph shows good positioning and no pneumothorax. Catheter was flushed and sutured externally with 0-Prolene sutures. Patient tolerated the procedure well. FLUOROSCOPY TIME:  Less than 0.1 minute; 15 uGym2 DAP COMPLICATIONS: COMPLICATIONS none IMPRESSION: 1. Technically successful right IJ Mahurkar catheter placement. Electronically Signed   By: Lucrezia Europe M.D.   On: 10/07/2018 13:07    Labs: BMET Recent Labs  Lab 10/03/18 0531 10/04/18 0602 10/05/18 0544 10/06/18 0615 10/07/18 0653 10/08/18 0310 10/09/18 0254  NA 134* 138 135 136 137 139 136  K 3.5 3.8 3.5  3.6 3.5 3.8 3.6  CL 106 106 104 104 107 104 101  CO2 19* 22 20* 22 22 25 26   GLUCOSE 91 90 96 90 99 99 82  BUN 64* 67* 71* 70* 72* 47* 29*  CREATININE 5.63* 6.01* 6.03* 6.47* 6.53* 5.23* 4.18*  CALCIUM 7.5* 7.9* 7.6* 7.4* 7.4* 7.6* 7.4*  PHOS 5.3* 5.9* 5.9* 6.0* 6.0* 4.6 3.3   CBC Recent Labs  Lab 10/08/18 0310 10/09/18 0254  WBC 4.8 4.7  HGB 8.7* 7.9*  HCT 26.6* 24.2*  MCV 83.9 85.5  PLT 115* 113*    Medications:    . atorvastatin  80 mg Oral QHS  . calcium acetate  667 mg Oral TID WC  . Chlorhexidine Gluconate Cloth  6 each Topical Q0600  . doxazosin  4 mg Oral Daily  . ezetimibe  10 mg Oral Daily  . feeding supplement (NEPRO CARB STEADY)  237 mL Oral BID BM  . fenofibrate  160 mg Oral Daily  . insulin aspart  0-5 Units Subcutaneous QHS  . insulin aspart  0-9 Units Subcutaneous TID WC  . loratadine  10 mg Oral QHS  . metoprolol succinate  50 mg Oral Daily  . nicotine  21 mg Transdermal  QHS  . sodium bicarbonate  1,300 mg Oral BID      Madelon Lips, MD [facility] [phone] 10/09/2018, 11:40 AM

## 2018-10-10 ENCOUNTER — Inpatient Hospital Stay (HOSPITAL_COMMUNITY): Payer: Medicare HMO

## 2018-10-10 DIAGNOSIS — N179 Acute kidney failure, unspecified: Secondary | ICD-10-CM

## 2018-10-10 LAB — RENAL FUNCTION PANEL
Albumin: 1.5 g/dL — ABNORMAL LOW (ref 3.5–5.0)
Anion gap: 8 (ref 5–15)
BUN: 37 mg/dL — ABNORMAL HIGH (ref 8–23)
CO2: 26 mmol/L (ref 22–32)
Calcium: 7.4 mg/dL — ABNORMAL LOW (ref 8.9–10.3)
Chloride: 102 mmol/L (ref 98–111)
Creatinine, Ser: 5.85 mg/dL — ABNORMAL HIGH (ref 0.61–1.24)
GFR calc Af Amer: 10 mL/min — ABNORMAL LOW (ref >60)
GFR calc non Af Amer: 9 mL/min — ABNORMAL LOW (ref >60)
Glucose, Bld: 98 mg/dL (ref 70–99)
Phosphorus: 5 mg/dL — ABNORMAL HIGH (ref 2.5–4.6)
Potassium: 4 mmol/L (ref 3.5–5.1)
Sodium: 136 mmol/L (ref 135–145)

## 2018-10-10 LAB — GLUCOSE, CAPILLARY
Glucose-Capillary: 81 mg/dL (ref 70–99)
Glucose-Capillary: 94 mg/dL (ref 70–99)
Glucose-Capillary: 81 mg/dL (ref 70–99)
Glucose-Capillary: 99 mg/dL (ref 70–99)

## 2018-10-10 LAB — PROTIME-INR
INR: 1.5 — ABNORMAL HIGH (ref 0.8–1.2)
Prothrombin Time: 18.2 seconds — ABNORMAL HIGH (ref 11.4–15.2)

## 2018-10-10 LAB — HEPATITIS B CORE ANTIBODY, TOTAL: Hep B Core Total Ab: NEGATIVE

## 2018-10-10 MED ORDER — MIDAZOLAM HCL 2 MG/2ML IJ SOLN
INTRAMUSCULAR | Status: AC | PRN
  Administered 2018-10-10: 0.5 mg via INTRAVENOUS
  Administered 2018-10-10: 1 mg via INTRAVENOUS

## 2018-10-10 MED ORDER — FENTANYL CITRATE (PF) 100 MCG/2ML IJ SOLN
INTRAMUSCULAR | Status: AC
  Filled 2018-10-10: qty 2

## 2018-10-10 MED ORDER — LIDOCAINE HCL (PF) 1 % IJ SOLN
INTRAMUSCULAR | Status: AC
  Filled 2018-10-10: qty 30

## 2018-10-10 MED ORDER — FENTANYL CITRATE (PF) 100 MCG/2ML IJ SOLN
INTRAMUSCULAR | Status: AC | PRN
  Administered 2018-10-10: 50 ug via INTRAVENOUS

## 2018-10-10 MED ORDER — GELATIN ABSORBABLE 12-7 MM EX MISC
CUTANEOUS | Status: AC
  Filled 2018-10-10: qty 1

## 2018-10-10 MED ORDER — MIDAZOLAM HCL 2 MG/2ML IJ SOLN
INTRAMUSCULAR | Status: AC
  Filled 2018-10-10: qty 2

## 2018-10-10 NOTE — Procedures (Signed)
Interventional Radiology Procedure:   Indications: ARF  Procedure: US guided left renal biopsy - random biopsy  Findings: 2 cores from left kidney lower pole  Complications: None     EBL: Less than 10 ml  Plan: Bedrest today.     Pearl Berlinger R. Anselm Pancoast, MD  Pager: [phone]

## 2018-10-10 NOTE — Progress Notes (Signed)
Patient ID: [name] Sloop., male   DOB: 05/25/1946, 73 y.o.   MRN: [id number] [facility] Progress Note   Assessment/ Plan:   1. Acute kidney Injury on chronic kidney disease stage III: Initially suspected to be from hemodynamically mediated ATN from excessive GI losses in the setting of ARB/diuretics but given the lack of anticipated progress, additional labs done that showed an M spike with detectable antinuclear antibody and anti-double-stranded DNA.  Renal biopsy requested for today.  He remains anuric and on hemodialysis 3 days a week. 2.  Anion gap metabolic acidosis: Secondary to acute kidney injury, corrected with dialysis sodium bicarbonate supplement.  Will discontinue oral sodium bicarbonate. 3.  Hypertension: Continue to monitor closely especially with dialysis status post kidney biopsy.  On metoprolol and doxazosin. 4.  Anemia: With significant iron deficiency, status post previous Feraheme.  Will give ESA.  Subjective:   Reports to be feeling fair, frustrated by being n.p.o. overnight.   Objective:   BP (!) 141/85 (BP Location: Left Arm)   Pulse 89   Temp 97.6 F (36.4 C) (Oral)   Resp 20   Ht 5\' 10"  (1.778 m)   Wt 86.5 kg   SpO2 93%   BMI 27.36 kg/m   Intake/Output Summary (Last 24 hours) at 10/10/2018 1103 Last data filed at 10/10/2018 0600 Gross per 24 hour  Intake 240 ml  Output 27 ml  Net 213 ml   Weight change: -0.9 kg  Physical Exam: Gen: Comfortably resting in bed CVS: Pulse regular rhythm, normal rate Resp: Clear to auscultation, no rales Abd: Soft, flat, nontender Ext: Without lower extremity edema.  Imaging: No results found.  Labs: BMET Recent Labs  Lab 10/04/18 0602 10/05/18 0544 10/06/18 0615 10/07/18 0653 10/08/18 0310 10/09/18 0254 10/10/18 0354  NA 138 135 136 137 139 136 136  K 3.8 3.5 3.6 3.5 3.8 3.6 4.0  CL 106 104 104 107 104 101 102  CO2 22 20* 22 22 25 26 26   GLUCOSE 90 96 90 99 99 82 98  BUN 67* 71* 70* 72*  47* 29* 37*  CREATININE 6.01* 6.03* 6.47* 6.53* 5.23* 4.18* 5.85*  CALCIUM 7.9* 7.6* 7.4* 7.4* 7.6* 7.4* 7.4*  PHOS 5.9* 5.9* 6.0* 6.0* 4.6 3.3 5.0*   CBC Recent Labs  Lab 10/08/18 0310 10/09/18 0254  WBC 4.8 4.7  HGB 8.7* 7.9*  HCT 26.6* 24.2*  MCV 83.9 85.5  PLT 115* 113*    Medications:    . atorvastatin  80 mg Oral QHS  . calcium acetate  667 mg Oral TID WC  . Chlorhexidine Gluconate Cloth  6 each Topical Q0600  . darbepoetin (ARANESP) injection - NON-DIALYSIS  100 mcg Subcutaneous Q Sun-1800  . doxazosin  4 mg Oral Daily  . ezetimibe  10 mg Oral Daily  . feeding supplement (NEPRO CARB STEADY)  237 mL Oral BID BM  . fenofibrate  160 mg Oral Daily  . insulin aspart  0-5 Units Subcutaneous QHS  . insulin aspart  0-9 Units Subcutaneous TID WC  . loratadine  10 mg Oral QHS  . metoprolol succinate  50 mg Oral Daily  . nicotine  21 mg Transdermal QHS  . sodium bicarbonate  1,300 mg Oral BID   Elmarie Shiley, MD 10/10/2018, 11:03 AM
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
## 2018-10-10 NOTE — Progress Notes (Signed)
[facility] PROGRESS NOTE    Progress Note  [name].  EQA:[id number] DOB: 28-Nov-1945 DOA: 09/27/2018 PCP: [name], MD     Brief Narrative:   [name]. is an 73 y.o. male past medical history of diabetes mellitus, hypertension COPD history of bladder cancer was sent to the hospital from her primary care doctor's office for worsening renal failure, he relates bouts of diarrhea for the past 3 weeks. Urinalysis on admission showed 3+ proteinuria with hematuria nephrology was consulted  Assessment/Plan:   Acute renal failure (ARF) ([name])/Uremia: Baseline creatinine around 1 back in 07/19/2018 SPEP showed an M spike, crease and C4 complement so nephrology is recommended renal biopsy. ANA positive with a double-stranded DNA that was 52 Discontinue Plavix for 03/19/2019. Status post tunneled catheter for HD placed. Continue HD per renal first treatment on 10/07/2018 and second treatment on 10/08/2018.Marland Kitchen For biopsy on 10/10/2018, IR to dictate when to start Plavix.  Hyperphosphatemia: We will start him on PhosLo 3 times daily.  Continue to follow phosphorus closely.  History of bladder cancer: He has been encouraged strongly to follow-up with urology as an outpatient.  Diabetes mellitus type 2: Still uncontrolled continue monitor blood glucose.  Essential Hypretension: Continue metoprolol.  Coronary artery disease: Stable continue Lipitor, currently asymptomatic hold Plavix for possible renal biopsy.  Hyperlipidemia: Continue current home medications.  Anemia of chronic kidney disease: Hemoglobin stable.   DVT prophylaxis: SCD's Family Communication:none Disposition Plan/Barrier to D/C: Once acute renal failure work-up completed. Code Status:     Code Status Orders  (From admission, onward)         Start     Ordered   09/27/18 1521  Full code  Continuous     09/27/18 1521        Code Status History    Date Active Date Inactive Code  Status Order ID Comments User Context   06/10/2018 1824 06/11/2018 1333 Full Code [id number]  [name], MD Inpatient   12/12/2014 1139 12/13/2014 0324 Full Code [id number]  Nelson Chimes, MD HOV   10/02/2013 1137 10/02/2013 1932 Partial Code [id number]  Sol Passer, MD ED        IV Access:    Peripheral IV   Procedures and diagnostic studies:   No results found.   Medical Consultants:    None.  Anti-Infectives:   None  Subjective:    [name]. complaints today.  Objective:    Vitals:   10/09/18 1728 10/09/18 2142 10/10/18 0416 10/10/18 0832  BP: 118/87 (!) 143/94 111/75 (!) 141/85  Pulse: 93 90 98 89  Resp:  20 20 20   Temp: 98.3 F (36.8 C) 98.1 F (36.7 C) 98.4 F (36.9 C) 97.6 F (36.4 C)  TempSrc: Oral Oral Oral Oral  SpO2: 94% 95% 91% 93%  Weight:  86.5 kg    Height:        Intake/Output Summary (Last 24 hours) at 10/10/2018 1029 Last data filed at 10/10/2018 0600 Gross per 24 hour  Intake 240 ml  Output 27 ml  Net 213 ml   Filed Weights   10/08/18 2110 10/08/18 2132 10/09/18 2142  Weight: 87 kg 87.9 kg 86.5 kg    Exam: General exam: In no acute distress. Respiratory system: Good air movement and clear to auscultation. Cardiovascular system: S1 & S2 heard, RRR. Gastrointestinal system: Abdomen is nondistended, soft and nontender.  Central nervous system: Alert and oriented. No focal neurological deficits. Extremities: No pedal edema. Skin: No rashes,  lesions or ulcers Psychiatry: Judgement and insight appear normal. Mood & affect appropriate.    Data Reviewed:    Labs: Basic Metabolic Panel: Recent Labs  Lab 10/06/18 0615 10/07/18 0653 10/08/18 0310 10/09/18 0254 10/10/18 0354  NA 136 137 139 136 136  K 3.6 3.5 3.8 3.6 4.0  CL 104 107 104 101 102  CO2 22 22 25 26 26   GLUCOSE 90 99 99 82 98  BUN 70* 72* 47* 29* 37*  CREATININE 6.47* 6.53* 5.23* 4.18* 5.85*  CALCIUM 7.4* 7.4* 7.6* 7.4* 7.4*  PHOS 6.0* 6.0* 4.6 3.3 5.0*    GFR Estimated Creatinine Clearance: 11.4 mL/min (A) (by C-G formula based on SCr of 5.85 mg/dL (H)). Liver Function Tests: Recent Labs  Lab 10/06/18 0615 10/07/18 0653 10/08/18 0310 10/09/18 0254 10/10/18 0354  ALBUMIN 1.6* 1.6* 1.6* 1.5* 1.5*   No results for input(s): LIPASE, AMYLASE in the last 168 hours. No results for input(s): AMMONIA in the last 168 hours. Coagulation profile Recent Labs  Lab 10/06/18 1152  INR 1.4*    CBC: Recent Labs  Lab 10/08/18 0310 10/09/18 0254  WBC 4.8 4.7  HGB 8.7* 7.9*  HCT 26.6* 24.2*  MCV 83.9 85.5  PLT 115* 113*   Cardiac Enzymes: No results for input(s): CKTOTAL, CKMB, CKMBINDEX, TROPONINI in the last 168 hours. BNP (last 3 results) No results for input(s): PROBNP in the last 8760 hours. CBG: Recent Labs  Lab 10/09/18 0711 10/09/18 1122 10/09/18 1622 10/09/18 2143 10/10/18 0632  GLUCAP 84 95 106* 111* 81   D-Dimer: No results for input(s): DDIMER in the last 72 hours. Hgb A1c: No results for input(s): HGBA1C in the last 72 hours. Lipid Profile: No results for input(s): CHOL, HDL, LDLCALC, TRIG, CHOLHDL, LDLDIRECT in the last 72 hours. Thyroid function studies: No results for input(s): TSH, T4TOTAL, T3FREE, THYROIDAB in the last 72 hours.  Invalid input(s): FREET3 Anemia work up: No results for input(s): VITAMINB12, FOLATE, FERRITIN, TIBC, IRON, RETICCTPCT in the last 72 hours. Sepsis Labs: Recent Labs  Lab 10/08/18 0310 10/09/18 0254  WBC 4.8 4.7   Microbiology No results found for this or any previous visit (from the past 240 hour(s)).   Medications:   . atorvastatin  80 mg Oral QHS  . calcium acetate  667 mg Oral TID WC  . Chlorhexidine Gluconate Cloth  6 each Topical Q0600  . darbepoetin (ARANESP) injection - NON-DIALYSIS  100 mcg Subcutaneous Q Sun-1800  . doxazosin  4 mg Oral Daily  . ezetimibe  10 mg Oral Daily  . feeding supplement (NEPRO CARB STEADY)  237 mL Oral BID BM  . fenofibrate  160  mg Oral Daily  . insulin aspart  0-5 Units Subcutaneous QHS  . insulin aspart  0-9 Units Subcutaneous TID WC  . loratadine  10 mg Oral QHS  . metoprolol succinate  50 mg Oral Daily  . nicotine  21 mg Transdermal QHS  . sodium bicarbonate  1,300 mg Oral BID   Continuous Infusions: . sodium chloride    . sodium chloride        LOS: 13 days   Charlynne Cousins  [facility]  10/10/2018, 10:29 AM

## 2018-10-11 LAB — CBC
HCT: 23.8 % — ABNORMAL LOW (ref 39.0–52.0)
HCT: 25.3 % — ABNORMAL LOW (ref 39.0–52.0)
Hemoglobin: 7.4 g/dL — ABNORMAL LOW (ref 13.0–17.0)
Hemoglobin: 7.8 g/dL — ABNORMAL LOW (ref 13.0–17.0)
MCH: 27.2 pg (ref 26.0–34.0)
MCH: 26.9 pg (ref 26.0–34.0)
MCHC: 31.1 g/dL (ref 30.0–36.0)
MCHC: 30.8 g/dL (ref 30.0–36.0)
MCV: 87.5 fL (ref 80.0–100.0)
MCV: 87.2 fL (ref 80.0–100.0)
Platelets: 96 10*3/uL — ABNORMAL LOW (ref 150–400)
Platelets: 98 10*3/uL — ABNORMAL LOW (ref 150–400)
RBC: 2.72 MIL/uL — ABNORMAL LOW (ref 4.22–5.81)
RBC: 2.9 MIL/uL — ABNORMAL LOW (ref 4.22–5.81)
RDW: 18.7 % — ABNORMAL HIGH (ref 11.5–15.5)
RDW: 18.7 % — ABNORMAL HIGH (ref 11.5–15.5)
WBC: 4 10*3/uL (ref 4.0–10.5)
WBC: 4.1 10*3/uL (ref 4.0–10.5)
nRBC: 0 % (ref 0.0–0.2)
nRBC: 0 % (ref 0.0–0.2)

## 2018-10-11 LAB — RENAL FUNCTION PANEL
Albumin: 1.4 g/dL — ABNORMAL LOW (ref 3.5–5.0)
Anion gap: 7 (ref 5–15)
BUN: 24 mg/dL — ABNORMAL HIGH (ref 8–23)
CO2: 28 mmol/L (ref 22–32)
Calcium: 7.3 mg/dL — ABNORMAL LOW (ref 8.9–10.3)
Chloride: 98 mmol/L (ref 98–111)
Creatinine, Ser: 4.68 mg/dL — ABNORMAL HIGH (ref 0.61–1.24)
GFR calc Af Amer: 13 mL/min — ABNORMAL LOW (ref >60)
GFR calc non Af Amer: 11 mL/min — ABNORMAL LOW (ref >60)
Glucose, Bld: 136 mg/dL — ABNORMAL HIGH (ref 70–99)
Phosphorus: 3.6 mg/dL (ref 2.5–4.6)
Potassium: 3.8 mmol/L (ref 3.5–5.1)
Sodium: 133 mmol/L — ABNORMAL LOW (ref 135–145)

## 2018-10-11 LAB — GLUCOSE, CAPILLARY
Glucose-Capillary: 76 mg/dL (ref 70–99)
Glucose-Capillary: 105 mg/dL — ABNORMAL HIGH (ref 70–99)
Glucose-Capillary: 107 mg/dL — ABNORMAL HIGH (ref 70–99)
Glucose-Capillary: 98 mg/dL (ref 70–99)

## 2018-10-11 LAB — HEPATITIS B SURFACE ANTIGEN: Hepatitis B Surface Ag: NEGATIVE

## 2018-10-11 MED ORDER — PHYTONADIONE 5 MG PO TABS
5.0000 mg | ORAL_TABLET | Freq: Once | ORAL | Status: AC
  Administered 2018-10-11: 5 mg via ORAL
  Filled 2018-10-11: qty 1

## 2018-10-11 MED ORDER — ANTICOAGULANT SODIUM CITRATE 4% (200MG/5ML) IV SOLN
5.0000 mL | Freq: Once | Status: AC
  Administered 2018-10-11: 5 mL via INTRAVENOUS
  Filled 2018-10-11 (×2): qty 5

## 2018-10-11 NOTE — Care Management Important Message (Signed)
Important Message  Patient Details  Name: [name]. MRN: [id number] Date of Birth: 1945/07/28   Medicare Important Message Given:  Yes    Kammi Hechler 10/11/2018, 4:09 PM

## 2018-10-11 NOTE — Progress Notes (Addendum)
[facility] PROGRESS NOTE    Progress Note  [name].  DJT:[id number] DOB: 1946-04-29 DOA: 09/27/2018 PCP: [name] Frizzle, MD     Brief Narrative:   [name]. is an 73 y.o. male past medical history of diabetes mellitus, hypertension COPD history of bladder cancer was sent to the hospital from her primary care doctor's office for worsening renal failure, he relates bouts of diarrhea for the past 3 weeks. Urinalysis on admission showed 3+ proteinuria with hematuria nephrology was consulted  Assessment/Plan:   Acute renal failure (ARF) ([name])/Uremia: Baseline creatinine around 1 back in 07/19/2018. Suspect hemodynamically mediated, but due to no improvement in his renal function with conservative management, labs were sent: SPEP showed an M spike, crease and C4 complement so nephrology is recommended renal biopsy. ANA positive with a double-stranded DNA that was 52 Plavix discontinued on 10/10/2018. Status post tunneled catheter for HD placed. Continue HD per renal first treatment on 10/07/2018 and second treatment on 10/08/2018.. S/p biopsy on 10/10/2018, IR to dictate when to start Plavix. INR was 1.5, he got empiric vitamin K orally. Follow Hbg closely.  Anion gap metabolic acidosis: Likely secondary to acute kidney injury, continue dialysis.  Hyperphosphatemia: We will start him on PhosLo 3 times daily.  Continue to follow phosphorus closely.  History of bladder cancer: He has been encouraged strongly to follow-up with urology as an outpatient.  Diabetes mellitus type 2: Still uncontrolled continue monitor blood glucose.  Essential Hypretension: Continue metoprolol.  Coronary artery disease: Stable continue Lipitor, currently asymptomatic hold Plavix for possible renal biopsy.  Hyperlipidemia: Continue current home medications.  Anemia of chronic kidney disease: Hemoglobin stable. Status post IV Feraheme.  Currently on EPO.   DVT prophylaxis:  SCD's Family Communication:none Disposition Plan/Barrier to D/C: Once acute renal failure work-up completed. Code Status:     Code Status Orders  (From admission, onward)         Start     Ordered   09/27/18 1521  Full code  Continuous     09/27/18 1521        Code Status History    Date Active Date Inactive Code Status Order ID Comments User Context   06/10/2018 1824 06/11/2018 1333 Full Code [id number]  [name], MD Inpatient   12/12/2014 1139 12/13/2014 0324 Full Code [id number]  Nelson Chimes, MD HOV   10/02/2013 1137 10/02/2013 1932 Partial Code [id number]  Sol Passer, MD ED        IV Access:    Peripheral IV   Procedures and diagnostic studies:   US Biopsy (kidney)  Result Date: 10/10/2018 INDICATION: 32 year old with acute kidney injury. Request for random renal biopsy. EXAM: ULTRASOUND-GUIDED LEFT RENAL BIOPSY MEDICATIONS: None. ANESTHESIA/SEDATION: Moderate (conscious) sedation was employed during this procedure. A total of Versed 1.5 mg and Fentanyl 50 mcg was administered intravenously. Moderate Sedation Time: 17 minutes. The patient's level of consciousness and vital signs were monitored continuously by radiology nursing throughout the procedure under my direct supervision. FLUOROSCOPY TIME:  None COMPLICATIONS: None immediate. PROCEDURE: Informed written consent was obtained from the patient after a thorough discussion of the procedural risks, benefits and alternatives. All questions were addressed. Maximal Sterile Barrier Technique was utilized including caps, mask, sterile gowns, sterile gloves, sterile drape, hand hygiene and skin antiseptic. A timeout was performed prior to the initiation of the procedure. Both kidneys were evaluated with ultrasound. The left kidney was selected for biopsy. Left flank was prepped with chlorhexidine and sterile field was created.  Skin and soft tissues were anesthetized with 1% lidocaine. Using ultrasound guidance, 16 gauge core biopsy  needle was directed into the left kidney lower pole. A total of 2 core biopsies were obtained. Specimens placed in saline. Bandage placed over the puncture site. FINDINGS: Negative for hydronephrosis. Core biopsies obtained from the left kidney lower pole. Negative for bleeding or hematoma formation following the core biopsies. IMPRESSION: Ultrasound-guided core biopsies of the left kidney lower pole. Electronically Signed   By: Markus Daft M.D.   On: 10/10/2018 17:15     Medical Consultants:    None.  Anti-Infectives:   None  Subjective:    [name]. relates he is in pain.  Objective:    Vitals:   10/11/18 0300 10/11/18 0330 10/11/18 0343 10/11/18 0517  BP: 137/68 123/60 124/73 (!) 119/57  Pulse: 90 89 91 86  Resp:   16 18  Temp:   98.4 F (36.9 C) 98.2 F (36.8 C)  TempSrc:   Oral Oral  SpO2:   94% 92%  Weight:   90.7 kg   Height:        Intake/Output Summary (Last 24 hours) at 10/11/2018 0855 Last data filed at 10/11/2018 0601 Gross per 24 hour  Intake 125 ml  Output 175 ml  Net -50 ml   Filed Weights   10/09/18 2142 10/11/18 0035 10/11/18 0343  Weight: 86.5 kg 90.7 kg 90.7 kg    Exam: General exam: In no acute distress. Respiratory system: Good air movement and clear to auscultation. Cardiovascular system: S1 & S2 heard, RRR. Gastrointestinal system: Abdomen is nondistended, soft and nontender.  Central nervous system: Alert and oriented. No focal neurological deficits. Extremities: No pedal edema. Skin: No rashes, lesions or ulcers Psychiatry: Judgement and insight appear normal. Mood & affect appropriate.    Data Reviewed:    Labs: Basic Metabolic Panel: Recent Labs  Lab 10/06/18 0615 10/07/18 0653 10/08/18 0310 10/09/18 0254 10/10/18 0354  NA 136 137 139 136 136  K 3.6 3.5 3.8 3.6 4.0  CL 104 107 104 101 102  CO2 22 22 25 26 26   GLUCOSE 90 99 99 82 98  BUN 70* 72* 47* 29* 37*  CREATININE 6.47* 6.53* 5.23* 4.18* 5.85*  CALCIUM  7.4* 7.4* 7.6* 7.4* 7.4*  PHOS 6.0* 6.0* 4.6 3.3 5.0*   GFR Estimated Creatinine Clearance: 12.6 mL/min (A) (by C-G formula based on SCr of 5.85 mg/dL (H)). Liver Function Tests: Recent Labs  Lab 10/06/18 0615 10/07/18 0653 10/08/18 0310 10/09/18 0254 10/10/18 0354  ALBUMIN 1.6* 1.6* 1.6* 1.5* 1.5*   No results for input(s): LIPASE, AMYLASE in the last 168 hours. No results for input(s): AMMONIA in the last 168 hours. Coagulation profile Recent Labs  Lab 10/06/18 1152 10/10/18 1109  INR 1.4* 1.5*    CBC: Recent Labs  Lab 10/08/18 0310 10/09/18 0254  WBC 4.8 4.7  HGB 8.7* 7.9*  HCT 26.6* 24.2*  MCV 83.9 85.5  PLT 115* 113*   Cardiac Enzymes: No results for input(s): CKTOTAL, CKMB, CKMBINDEX, TROPONINI in the last 168 hours. BNP (last 3 results) No results for input(s): PROBNP in the last 8760 hours. CBG: Recent Labs  Lab 10/10/18 0632 10/10/18 1148 10/10/18 1630 10/10/18 2041 10/11/18 0637  GLUCAP 81 94 81 99 76   D-Dimer: No results for input(s): DDIMER in the last 72 hours. Hgb A1c: No results for input(s): HGBA1C in the last 72 hours. Lipid Profile: No results for input(s): CHOL, HDL, LDLCALC, TRIG, CHOLHDL,  LDLDIRECT in the last 72 hours. Thyroid function studies: No results for input(s): TSH, T4TOTAL, T3FREE, THYROIDAB in the last 72 hours.  Invalid input(s): FREET3 Anemia work up: No results for input(s): VITAMINB12, FOLATE, FERRITIN, TIBC, IRON, RETICCTPCT in the last 72 hours. Sepsis Labs: Recent Labs  Lab 10/08/18 0310 10/09/18 0254  WBC 4.8 4.7   Microbiology No results found for this or any previous visit (from the past 240 hour(s)).   Medications:   . atorvastatin  80 mg Oral QHS  . calcium acetate  667 mg Oral TID WC  . Chlorhexidine Gluconate Cloth  6 each Topical Q0600  . darbepoetin (ARANESP) injection - NON-DIALYSIS  100 mcg Subcutaneous Q Sun-1800  . doxazosin  4 mg Oral Daily  . ezetimibe  10 mg Oral Daily  . feeding  supplement (NEPRO CARB STEADY)  237 mL Oral BID BM  . fenofibrate  160 mg Oral Daily  . insulin aspart  0-5 Units Subcutaneous QHS  . insulin aspart  0-9 Units Subcutaneous TID WC  . loratadine  10 mg Oral QHS  . metoprolol succinate  50 mg Oral Daily  . nicotine  21 mg Transdermal QHS   Continuous Infusions: . sodium chloride    . sodium chloride        LOS: 14 days   Charlynne Cousins  [facility]  10/11/2018, 8:55 AM

## 2018-10-11 NOTE — Progress Notes (Signed)
Patient ID: [name]., male   DOB: [birth year], 73 y.o.   MRN: [id number] [facility] Progress Note   Assessment/ Plan:   1. Acute kidney Injury on chronic kidney disease stage III: Initially suspected to be from hemodynamically mediated ATN from excessive GI losses in the setting of ARB/diuretics but given the lack of anticipated progress, additional labs done that showed an M spike with detectable antinuclear antibody and anti-double-stranded DNA.  Status post renal biopsy followed by hemodialysis yesterday.  We will continue to follow his labs closely to assess for renal recovery while maintaining a Monday/Wednesday/Friday dialysis schedule.  Renal biopsy will not only be useful for diagnostic but for prognostic reasons. 2.  Anion gap metabolic acidosis: Secondary to acute kidney injury, corrected with dialysis. 3.  Hypertension: Appears to be under acceptable control on metoprolol and doxazosin. 4.  Anemia: With significant iron deficiency, status post previous Feraheme.  Given ESA.  Subjective:   Complains of some pain over the kidney biopsy site this morning-"not terrible".   Objective:   BP 132/67 (BP Location: Left Arm)   Pulse 85   Temp 98.5 F (36.9 C) (Oral)   Resp 18   Ht 5\' 10"  (1.778 m)   Wt 90.7 kg   SpO2 98%   BMI 28.69 kg/m   Intake/Output Summary (Last 24 hours) at 10/11/2018 1003 Last data filed at 10/11/2018 0601 Gross per 24 hour  Intake 125 ml  Output 175 ml  Net -50 ml   Weight change: 4.2 kg  Physical Exam: Gen: Resting comfortably in bed CVS: Pulse regular rhythm, normal rate Resp: Clear to auscultation, no rales Abd: Soft, flat, nontender Ext: No lower extremity edema.  Imaging: US Biopsy (kidney)  Result Date: 10/10/2018 INDICATION: 43 year old with acute kidney injury. Request for random renal biopsy. EXAM: ULTRASOUND-GUIDED LEFT RENAL BIOPSY MEDICATIONS: None. ANESTHESIA/SEDATION: Moderate (conscious) sedation was employed  during this procedure. A total of Versed 1.5 mg and Fentanyl 50 mcg was administered intravenously. Moderate Sedation Time: 17 minutes. The patient's level of consciousness and vital signs were monitored continuously by radiology nursing throughout the procedure under my direct supervision. FLUOROSCOPY TIME:  None COMPLICATIONS: None immediate. PROCEDURE: Informed written consent was obtained from the patient after a thorough discussion of the procedural risks, benefits and alternatives. All questions were addressed. Maximal Sterile Barrier Technique was utilized including caps, mask, sterile gowns, sterile gloves, sterile drape, hand hygiene and skin antiseptic. A timeout was performed prior to the initiation of the procedure. Both kidneys were evaluated with ultrasound. The left kidney was selected for biopsy. Left flank was prepped with chlorhexidine and sterile field was created. Skin and soft tissues were anesthetized with 1% lidocaine. Using ultrasound guidance, 16 gauge core biopsy needle was directed into the left kidney lower pole. A total of 2 core biopsies were obtained. Specimens placed in saline. Bandage placed over the puncture site. FINDINGS: Negative for hydronephrosis. Core biopsies obtained from the left kidney lower pole. Negative for bleeding or hematoma formation following the core biopsies. IMPRESSION: Ultrasound-guided core biopsies of the left kidney lower pole. Electronically Signed   By: Markus Daft M.D.   On: 10/10/2018 17:15    Labs: BMET Recent Labs  Lab 10/05/18 0544 10/06/18 0615 10/07/18 0653 10/08/18 0310 10/09/18 0254 10/10/18 0354  NA 135 136 137 139 136 136  K 3.5 3.6 3.5 3.8 3.6 4.0  CL 104 104 107 104 101 102  CO2 20* 22 22 25 26 26   GLUCOSE 96 90  99 99 82 98  BUN 71* 70* 72* 47* 29* 37*  CREATININE 6.03* 6.47* 6.53* 5.23* 4.18* 5.85*  CALCIUM 7.6* 7.4* 7.4* 7.6* 7.4* 7.4*  PHOS 5.9* 6.0* 6.0* 4.6 3.3 5.0*   CBC Recent Labs  Lab 10/08/18 0310  10/09/18 0254  WBC 4.8 4.7  HGB 8.7* 7.9*  HCT 26.6* 24.2*  MCV 83.9 85.5  PLT 115* 113*    Medications:    . atorvastatin  80 mg Oral QHS  . calcium acetate  667 mg Oral TID WC  . Chlorhexidine Gluconate Cloth  6 each Topical Q0600  . darbepoetin (ARANESP) injection - NON-DIALYSIS  100 mcg Subcutaneous Q Sun-1800  . doxazosin  4 mg Oral Daily  . ezetimibe  10 mg Oral Daily  . feeding supplement (NEPRO CARB STEADY)  237 mL Oral BID BM  . fenofibrate  160 mg Oral Daily  . insulin aspart  0-5 Units Subcutaneous QHS  . insulin aspart  0-9 Units Subcutaneous TID WC  . loratadine  10 mg Oral QHS  . metoprolol succinate  50 mg Oral Daily  . nicotine  21 mg Transdermal QHS   Elmarie Shiley, MD 10/11/2018, 10:03 AM

## 2018-10-12 DIAGNOSIS — I2510 Atherosclerotic heart disease of native coronary artery without angina pectoris: Secondary | ICD-10-CM

## 2018-10-12 DIAGNOSIS — Z8551 Personal history of malignant neoplasm of bladder: Secondary | ICD-10-CM

## 2018-10-12 LAB — CBC
HCT: 23.5 % — ABNORMAL LOW (ref 39.0–52.0)
Hemoglobin: 7.3 g/dL — ABNORMAL LOW (ref 13.0–17.0)
MCH: 27.1 pg (ref 26.0–34.0)
MCHC: 31.1 g/dL (ref 30.0–36.0)
MCV: 87.4 fL (ref 80.0–100.0)
Platelets: 105 10*3/uL — ABNORMAL LOW (ref 150–400)
Platelets: 95 10*3/uL — ABNORMAL LOW (ref 150–400)
RBC: 2.69 MIL/uL — ABNORMAL LOW (ref 4.22–5.81)
RDW: 18.9 % — ABNORMAL HIGH (ref 11.5–15.5)
WBC: 4.5 10*3/uL (ref 4.0–10.5)
nRBC: 0 % (ref 0.0–0.2)

## 2018-10-12 LAB — RENAL FUNCTION PANEL
Albumin: 1.4 g/dL — ABNORMAL LOW (ref 3.5–5.0)
Anion gap: 6 (ref 5–15)
BUN: 31 mg/dL — ABNORMAL HIGH (ref 8–23)
CO2: 28 mmol/L (ref 22–32)
Calcium: 7.4 mg/dL — ABNORMAL LOW (ref 8.9–10.3)
Chloride: 99 mmol/L (ref 98–111)
Creatinine, Ser: 6.16 mg/dL — ABNORMAL HIGH (ref 0.61–1.24)
GFR calc Af Amer: 9 mL/min — ABNORMAL LOW (ref >60)
GFR calc non Af Amer: 8 mL/min — ABNORMAL LOW (ref >60)
Glucose, Bld: 98 mg/dL (ref 70–99)
Phosphorus: 4.2 mg/dL (ref 2.5–4.6)
Potassium: 4 mmol/L (ref 3.5–5.1)
Sodium: 133 mmol/L — ABNORMAL LOW (ref 135–145)

## 2018-10-12 LAB — GLUCOSE, CAPILLARY
Glucose-Capillary: 84 mg/dL (ref 70–99)
Glucose-Capillary: 81 mg/dL (ref 70–99)
Glucose-Capillary: 117 mg/dL — ABNORMAL HIGH (ref 70–99)
Glucose-Capillary: 144 mg/dL — ABNORMAL HIGH (ref 70–99)

## 2018-10-12 LAB — HEPATITIS B SURFACE ANTIBODY, QUANTITATIVE: Hep B S AB Quant (Post): <3.1 m[IU]/mL — ABNORMAL LOW (ref >9.9)

## 2018-10-12 MED ORDER — CYCLOPHOSPHAMIDE 25 MG PO CAPS: 125.0000 mg | ORAL_CAPSULE | Freq: Every day | ORAL | Status: DC

## 2018-10-12 MED ORDER — PREDNISONE 50 MG PO TABS
60.0000 mg | ORAL_TABLET | Freq: Every day | ORAL | Status: DC
  Administered 2018-10-15 – 2018-10-20 (×5): 60 mg via ORAL
  Filled 2018-10-12 (×6): qty 1

## 2018-10-12 MED ORDER — CYCLOPHOSPHAMIDE 50 MG PO CAPS
150.0000 mg | ORAL_CAPSULE | Freq: Every day | ORAL | Status: DC
  Administered 2018-10-12 – 2018-10-17 (×6): 150 mg via ORAL
  Filled 2018-10-12 (×7): qty 3

## 2018-10-12 MED ORDER — SODIUM CHLORIDE 0.9 % IV SOLN
500.0000 mg | Freq: Every day | INTRAVENOUS | Status: AC
  Administered 2018-10-12 – 2018-10-14 (×3): 500 mg via INTRAVENOUS
  Filled 2018-10-12 (×4): qty 4

## 2018-10-12 NOTE — Progress Notes (Signed)
Patient ID: [name], male   DOB: [birth year], 73 y.o.   MRN: [id number] [facility] Progress Note   Assessment/ Plan:   1. Acute kidney Injury on chronic kidney disease stage III: Initially suspected to be hemodynamically mediated and possibly ATN but remained anuric and without significant renal recovery prompting additional evaluation that showed an M spike with positive ANA and anti-double-stranded DNA for which renal biopsy was done on 4/13.  He remains anuric and without evidence of renal recovery, continue dialysis on a Monday/Wednesday/Friday schedule.  Will send off for HCV antibody/PCR to evaluate for possible cryoglobulinemia.  On HD since 4/10. 2.  Anion gap metabolic acidosis: Secondary to acute kidney injury, corrected with dialysis. 3.  Hypertension: Appears to be under acceptable control on metoprolol and doxazosin and ultrafiltration at hemodialysis. 4.  Anemia: With significant iron deficiency, status post previous Feraheme.  On ESA, without overt loss.  Subjective:   Denies any acute events overnight, tolerable pain at kidney biopsy site.   Objective:   BP 113/63 (BP Location: Left Arm)   Pulse 85   Temp 97.7 F (36.5 C) (Oral)   Resp 18   Ht 5\' 10"  (1.778 m)   Wt 89.5 kg   SpO2 98%   BMI 28.31 kg/m   Intake/Output Summary (Last 24 hours) at 10/12/2018 0956 Last data filed at 10/12/2018 0201 Gross per 24 hour  Intake 200 ml  Output 100 ml  Net 100 ml   Weight change: -2.158 kg  Physical Exam: Gen: Resting comfortably at hemodialysis CVS: Pulse regular rhythm, normal rate Resp: Clear to auscultation, no rales Abd: Soft, flat, nontender Ext: 2+ lower extremity edema.  Imaging: US Biopsy (kidney)  Result Date: 10/10/2018 INDICATION: 48 year old with acute kidney injury. Request for random renal biopsy. EXAM: ULTRASOUND-GUIDED LEFT RENAL BIOPSY MEDICATIONS: None. ANESTHESIA/SEDATION: Moderate (conscious) sedation was employed during this  procedure. A total of Versed 1.5 mg and Fentanyl 50 mcg was administered intravenously. Moderate Sedation Time: 17 minutes. The patient's level of consciousness and vital signs were monitored continuously by radiology nursing throughout the procedure under my direct supervision. FLUOROSCOPY TIME:  None COMPLICATIONS: None immediate. PROCEDURE: Informed written consent was obtained from the patient after a thorough discussion of the procedural risks, benefits and alternatives. All questions were addressed. Maximal Sterile Barrier Technique was utilized including caps, mask, sterile gowns, sterile gloves, sterile drape, hand hygiene and skin antiseptic. A timeout was performed prior to the initiation of the procedure. Both kidneys were evaluated with ultrasound. The left kidney was selected for biopsy. Left flank was prepped with chlorhexidine and sterile field was created. Skin and soft tissues were anesthetized with 1% lidocaine. Using ultrasound guidance, 16 gauge core biopsy needle was directed into the left kidney lower pole. A total of 2 core biopsies were obtained. Specimens placed in saline. Bandage placed over the puncture site. FINDINGS: Negative for hydronephrosis. Core biopsies obtained from the left kidney lower pole. Negative for bleeding or hematoma formation following the core biopsies. IMPRESSION: Ultrasound-guided core biopsies of the left kidney lower pole. Electronically Signed   By: Markus Daft M.D.   On: 10/10/2018 17:15    Labs: BMET Recent Labs  Lab 10/06/18 0615 10/07/18 0277 10/08/18 0310 10/09/18 0254 10/10/18 0354 10/11/18 0945 10/12/18 0417  NA 136 137 139 136 136 133* 133*  K 3.6 3.5 3.8 3.6 4.0 3.8 4.0  CL 104 107 104 101 102 98 99  CO2 22 22 25 26 26 28 28   GLUCOSE  90 99 99 82 98 136* 98  BUN 70* 72* 47* 29* 37* 24* 31*  CREATININE 6.47* 6.53* 5.23* 4.18* 5.85* 4.68* 6.16*  CALCIUM 7.4* 7.4* 7.6* 7.4* 7.4* 7.3* 7.4*  PHOS 6.0* 6.0* 4.6 3.3 5.0* 3.6 4.2    CBC Recent Labs  Lab 10/09/18 0254 10/11/18 0945 10/11/18 1725 10/12/18 0417  WBC 4.7 4.0 4.1 4.5  HGB 7.9* 7.4* 7.8* 7.3*  HCT 24.2* 23.8* 25.3* 23.5*  MCV 85.5 87.5 87.2 87.4  PLT 113* 96* 98* 105*    Medications:    . atorvastatin  80 mg Oral QHS  . calcium acetate  667 mg Oral TID WC  . Chlorhexidine Gluconate Cloth  6 each Topical Q0600  . darbepoetin (ARANESP) injection - NON-DIALYSIS  100 mcg Subcutaneous Q Sun-1800  . doxazosin  4 mg Oral Daily  . ezetimibe  10 mg Oral Daily  . feeding supplement (NEPRO CARB STEADY)  237 mL Oral BID BM  . fenofibrate  160 mg Oral Daily  . insulin aspart  0-5 Units Subcutaneous QHS  . insulin aspart  0-9 Units Subcutaneous TID WC  . loratadine  10 mg Oral QHS  . metoprolol succinate  50 mg Oral Daily  . nicotine  21 mg Transdermal QHS   Elmarie Shiley, MD 10/12/2018, 9:56 AM

## 2018-10-12 NOTE — Progress Notes (Signed)
Patient ID: [name]., male   DOB: 03/18/1946, 73 y.o.   MRN: [id number] I was called by Dr. Hollie Salk who informed me about the preliminary report of this patient's renal biopsy (verbally called to her by Dr. Candiss Norse at [facility]).  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  I informed the patient about this diagnosis and the intention to treat him aggressively with immunosuppressive therapy.  I also counseled him regarding potential toxicities of the medications that I plan to use: 1. Solu-Medrol transitioning to oral prednisone tapered over 8 to 12 weeks 2.  Cyclophosphamide (oral) 1.5-2 mg/kilogram daily for 3 to 6 months transitioning to azathioprine.  Orders entered in chart.  Elmarie Shiley MD [facility]. Office # [phone] Pager # [phone] 3:20 PM

## 2018-10-12 NOTE — Procedures (Signed)
Patient seen on Hemodialysis. BP 113/63 (BP Location: Left Arm)   Pulse 85   Temp 97.7 F (36.5 C) (Oral)   Resp 18   Ht 5\' 10"  (1.778 m)   Wt 89.5 kg   SpO2 98%   BMI 28.31 kg/m   QB 400, UF goal 2.7L Tolerating treatment without complaints at this time.   Elmarie Shiley MD [facility]. Office # [phone] Pager # [phone] 10:08 AM

## 2018-10-12 NOTE — Progress Notes (Signed)
PROGRESS NOTE  [name]. WUJ:[id number] DOB: [birth year] DOA: 09/27/2018 PCP: [name], MD  Brief History   [name]. is an 73 y.o. male past medical history of diabetes mellitus, hypertension COPD history of bladder cancer was sent to [name] hospital from her primary care doctor's office for worsening renal failure, he relates bouts of diarrhea for [name] past 3 weeks. Urinalysis on admission showed 3+ proteinuria with hematuria nephrology was consulted  [name] underwent a renal biopsy on 10/10/2018.  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 They have initiated treatment with solumedrol that will be transitioned over time to oral prednisone with a predicted term of treatment of 8-12 weeks. They will also be treating [name] with Cyclophosphamide at 1.5 - 2.0 mg/kg daily for 3-6 months with eventual transition to azathioprine.  Consultants   Interventional radiology for renal biopsy  Nephrology  Procedures   Renal biopsy on 10/10/2018  Antibiotics    None  Interval History/Subjective   See above.  [name] is seen after hemodialysis. He is lying on his side resting. He really does not want to be bothered at this time. He agrees to allow me to examine him quickly.  Objective   Vitals:  Vitals:   10/12/18 1120 10/12/18 1230  BP: (!) 120/57 126/70  Pulse: 82 90  Resp: 18 18  Temp: 98.4 F (36.9 C) 97.9 F (36.6 C)  SpO2: 96% 96%    Exam:  Constitutional:   [name] is resting quietly. No acute distress. Respiratory:   No wheezes, rales, or rhonchi.   No tactile fremitus  No increased work of breathing. Cardiovascular:   Regular, rate and rhythm  No murmurs, ectopy or gallups  No lateral PMI. No thrills.  No lower extremity edema  Normal pedal pulses Abdomen:   Abdomen appears normal; no tenderness or masses  No  hernias  No HSM Musculoskeletal:   No cyanosis, clubbing or edema Skin:   No rashes, lesions, ulcers. [name] does have multiple tattoos on his upper extremities.  palpation of skin: no induration or nodules Neurologic:   CN 2-12 intact  Sensation all 4 extremities intact Psychiatric:   Mental status o Mood depresssed, affect flat/withdrawn o Orientation to person, place, time   judgment and insight - unable to gauge on this visit.  I have personally reviewed [name] following:   Today's Data   CBC, BMP, Vitals  Other Data   Path report as related by Nephrology.  Scheduled Meds:  atorvastatin  80 mg Oral QHS   calcium acetate  667 mg Oral TID WC   Chlorhexidine Gluconate Cloth  6 each Topical Q0600   darbepoetin (ARANESP) injection - NON-DIALYSIS  100 mcg Subcutaneous Q Sun-1800   doxazosin  4 mg Oral Daily   ezetimibe  10 mg Oral Daily   feeding supplement (NEPRO CARB STEADY)  237 mL Oral BID BM   fenofibrate  160 mg Oral Daily   insulin aspart  0-5 Units Subcutaneous QHS   insulin aspart  0-9 Units Subcutaneous TID WC   loratadine  10 mg Oral QHS   metoprolol succinate  50 mg Oral Daily   nicotine  21 mg Transdermal QHS   [START ON 10/15/2018] predniSONE  60 mg Oral Q breakfast   Continuous Infusions:  sodium chloride     sodium chloride     methylPREDNISolone (SOLU-MEDROL) injection      Active Problems:  Acute renal failure (ARF) ([name])   History of bladder cancer   Type 2 diabetes mellitus without complication ([name])   CAD (coronary artery disease)   Uremia   AKI (acute kidney injury) ([place])   A & P   Acute renal failure (ARF) ([name])/Uremia: Creatinine today is increased to 6.16. This is up from what is assumed to be baseline creatinine of 1 on 07/19/2018. Nephrology consulted when renal insufficiency only worsened with IV fluid infusion and conservative management. SPEP was performed which demonstrated M spike and C4 complement.  Immunological work up also revealed an ANA that was positive with a double-stranded DNA that was 53. [name] underwent renal biopsy by IR on 10/10/2018. He tolerated [name] procedure well. 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 This will be treated with steroids and cyclophosphamide initially.   [name] is anuric and has had a tunnelled catheter placed for dialysis. He is on a MWF dialysis schedule.   Anion gap metabolic acidosis: Resolved. Likely secondary to acute kidney injury, continue dialysis.  Hyperphosphatemia: [name] has been started on PhosLo 3 times daily.  Continue to follow phosphorus closely.  History of bladder cancer: He has been encouraged strongly to follow-up with urology as an outpatient.  Diabetes mellitus type 2: FSBS 81 - 107 in [name] last 24 hours. [name] is receiving only correction insulin. Anticipate elevated glucoses with [name] initiation of steroid therapy. He will likely wind up requiring some lantus for this, but will not start it yet.  Essential Hypretension: Continue metoprolol.  Coronary artery disease: Stable continue Lipitor, currently asymptomatic hold Plavix for possible renal biopsy.  Hyperlipidemia: Continue current home medications.  Anemia of chronic kidney disease:Hemoglobin stable. Status post IV Feraheme.  Currently on EPO.  I have seen and examined this [name] myself. I have spent 32 minutes in his evaluation and care.  DVT prophylaxis: SCD's Family Communication: None available. Disposition Plan/Barrier to D/C: Once acute renal failure work-up completed. Code Status: Full Code  Sheryn Aldaz, DO [facility] Direct contact: see [email or web address]  7PM-7AM contact night coverage as above 10/12/2018, 3:24 PM  LOS: 15 days    LOS: 15 days

## 2018-10-13 LAB — CBC
HCT: 25.6 % — ABNORMAL LOW (ref 39.0–52.0)
HCT: 26.3 % — ABNORMAL LOW (ref 39.0–52.0)
Hemoglobin: 8.3 g/dL — ABNORMAL LOW (ref 13.0–17.0)
Hemoglobin: 8.5 g/dL — ABNORMAL LOW (ref 13.0–17.0)
MCH: 28.2 pg (ref 26.0–34.0)
MCH: 28 pg (ref 26.0–34.0)
MCHC: 32.4 g/dL (ref 30.0–36.0)
MCHC: 32.3 g/dL (ref 30.0–36.0)
MCV: 87.1 fL (ref 80.0–100.0)
MCV: 86.5 fL (ref 80.0–100.0)
Platelets: 91 10*3/uL — ABNORMAL LOW (ref 150–400)
Platelets: 114 10*3/uL — ABNORMAL LOW (ref 150–400)
RBC: 2.94 MIL/uL — ABNORMAL LOW (ref 4.22–5.81)
RBC: 3.04 MIL/uL — ABNORMAL LOW (ref 4.22–5.81)
RDW: 18.7 % — ABNORMAL HIGH (ref 11.5–15.5)
RDW: 18.7 % — ABNORMAL HIGH (ref 11.5–15.5)
WBC: 2.4 10*3/uL — ABNORMAL LOW (ref 4.0–10.5)
WBC: 4.2 10*3/uL (ref 4.0–10.5)
nRBC: 0 % (ref 0.0–0.2)
nRBC: 0 % (ref 0.0–0.2)

## 2018-10-13 LAB — RENAL FUNCTION PANEL
Albumin: 1.6 g/dL — ABNORMAL LOW (ref 3.5–5.0)
Anion gap: 10 (ref 5–15)
BUN: 18 mg/dL (ref 8–23)
CO2: 26 mmol/L (ref 22–32)
Calcium: 7.4 mg/dL — ABNORMAL LOW (ref 8.9–10.3)
Chloride: 97 mmol/L — ABNORMAL LOW (ref 98–111)
Creatinine, Ser: 4.18 mg/dL — ABNORMAL HIGH (ref 0.61–1.24)
GFR calc Af Amer: 15 mL/min — ABNORMAL LOW (ref >60)
GFR calc non Af Amer: 13 mL/min — ABNORMAL LOW (ref >60)
Glucose, Bld: 177 mg/dL — ABNORMAL HIGH (ref 70–99)
Phosphorus: 5.5 mg/dL — ABNORMAL HIGH (ref 2.5–4.6)
Potassium: 3.9 mmol/L (ref 3.5–5.1)
Sodium: 133 mmol/L — ABNORMAL LOW (ref 135–145)

## 2018-10-13 LAB — GLUCOSE, CAPILLARY
Glucose-Capillary: 139 mg/dL — ABNORMAL HIGH (ref 70–99)
Glucose-Capillary: 142 mg/dL — ABNORMAL HIGH (ref 70–99)
Glucose-Capillary: 256 mg/dL — ABNORMAL HIGH (ref 70–99)
Glucose-Capillary: 177 mg/dL — ABNORMAL HIGH (ref 70–99)
Glucose-Capillary: 163 mg/dL — ABNORMAL HIGH (ref 70–99)

## 2018-10-13 LAB — HEPATITIS B SURFACE ANTIGEN: Hepatitis B Surface Ag: NEGATIVE

## 2018-10-13 LAB — MPO/PR-3 (ANCA) ANTIBODIES
ANCA Proteinase 3: <3.5 U/mL (ref 0.0–3.5)
Myeloperoxidase Abs: <9 U/mL (ref 0.0–9.0)

## 2018-10-13 LAB — GLOMERULAR BASEMENT MEMBRANE ANTIBODIES: GBM Ab: 4 units (ref 0–20)

## 2018-10-13 MED ORDER — PRO-STAT SUGAR FREE PO LIQD
30.0000 mL | Freq: Three times a day (TID) | ORAL | Status: DC
  Administered 2018-10-13 – 2018-10-19 (×16): 30 mL via ORAL
  Filled 2018-10-13 (×19): qty 30

## 2018-10-13 MED ORDER — RENA-VITE PO TABS
1.0000 | ORAL_TABLET | Freq: Every day | ORAL | Status: DC
  Administered 2018-10-13 – 2018-10-19 (×7): 1 via ORAL
  Filled 2018-10-13 (×7): qty 1

## 2018-10-13 NOTE — Progress Notes (Addendum)
Nutrition Follow-up  DOCUMENTATION CODES:   Not applicable  INTERVENTION:   Pt is edentulous; downgrade diet to Dysphagia III  Add 30 ml Prostat TID, each supplement provides 100 kcals and 15 grams protein.   Add Snacks TID between meals  Add Renal MVI  D/C Nepro   NUTRITION DIAGNOSIS:   Inadequate oral intake related to poor appetite, early satiety as evidenced by per patient/family report, estimated needs.  Being addressed via supplements, snacks  GOAL:   Patient will meet greater than or equal to 90% of their needs  Progressing  MONITOR:   PO intake, Supplement acceptance, Weight trends, Labs, I & O's  REASON FOR ASSESSMENT:   Malnutrition Screening Tool    ASSESSMENT:   73 yo male, admitted with acute renal failure. PMH significant for h/o bladder cancer, COPD, CAD, CM, h/o colonic polyps, hyperlipidemia, HTN, PAD.  4/10 HD Initiated 4/13 Renal Biopsy  4/16 Prelim report: Pauci immune golmerulonephtiris started on immunosuppressive therapy  Last HD yesterday; pt reports tolerating relatively well. Pt remains anuric; per Nephrology, plan to pursue tunneled HD cath next week if no improvement. Plan further diet ed as needed at this time  Recorded po intake 50-100%. Pt reports breakfast this AM was good and he ate well. Pt reports he is very limited as he does not eat meat and we always send him meat. Noted it has been changed in system that pt is a vegetarian. Pt reports he cannot eat red meat due alpha-gal allergy. Pt reports he has not eaten chicken since he was a little kid and isn't going to start. Pt reports he used to eat [place] but is sick of it. Pt reports he doesn't eat fish/shellfish. Pt does indicate he will eat tuna fish and eggs.   Pt reports he cannot tolerate Nepro/protein shakes, reports it makes him sick to his stomach and he cannot do it. Reinforced importance of adequate protein; pt agreeable to high protein snacks TID. RD to also order  Pro-Stat protein modular TID as well  Current wt 87.7 kg; admission wt 88.9 kg  Labs:  Sodium 133, phosphorus 5.5 (acceptable for HD) Meds: Phos-Lo, Aranesp, solumedrol     Diet Order:   Diet Order            Diet renal with fluid restriction Fluid restriction: 1200 mL Fluid; Room service appropriate? Yes; Fluid consistency: Thin  Diet effective now              EDUCATION NEEDS:   Education needs have been addressed  Skin:  Skin Assessment: Skin Integrity Issues: Skin Integrity Issues:: Other (Comment) Other: Ecchymosis - BL arms  Last BM:  4/13  Height:   Ht Readings from Last 1 Encounters:  09/27/18 5\' 10"  (1.778 m)    Weight:   Wt Readings from Last 1 Encounters:  10/12/18 87.7 kg    Ideal Body Weight:  75.5 kg  BMI:  Body mass index is 27.74 kg/m.  Estimated Nutritional Needs:   Kcal:  1900-2280 (25-30 kcal/kg IBW)  Protein:  91-114 gm (1.2-1.5 g/kg IBW)  Fluid:  >/= 1.5 L daily or per MD   Kerman Passey MS, RD, LDN, CNSC [phone] Pager  [phone] Weekend/On-Call Pager

## 2018-10-13 NOTE — Progress Notes (Signed)
PROGRESS NOTE  [name]. HAL:[id number] DOB: 1945-08-24 DOA: 09/27/2018 PCP: [name], MD  Brief History   [name]. is an 73 y.o. male past medical history of diabetes mellitus, hypertension COPD history of bladder cancer was sent to the hospital from her primary care doctor's office for worsening renal failure, he relates bouts of diarrhea for the past 3 weeks. Urinalysis on admission showed 3+ proteinuria with hematuria nephrology was consulted  The patient underwent a renal biopsy on 10/10/2018.  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 They have initiated treatment with solumedrol that will be transitioned over time to oral prednisone with a predicted term of treatment of 8-12 weeks. They will also be treating the patient with Cyclophosphamide at 1.5 - 2.0 mg/kg daily for 3-6 months with eventual transition to azathioprine.  Consultants   Interventional radiology for renal biopsy  Nephrology  Procedures   Renal biopsy on 10/10/2018  Antibiotics    None  Interval History/Subjective   See above.  The patient is resting comfortably. No new complaints.  Objective   Vitals:  Vitals:   10/13/18 0948 10/13/18 1650  BP: 130/67 137/77  Pulse: 84 70  Resp: 20 18  Temp: (!) 97.3 F (36.3 C) (!) 97.4 F (36.3 C)  SpO2: 94% 97%    Exam:  Constitutional:   The patient is resting quietly. No acute distress. Respiratory:   No wheezes, rales, or rhonchi.   No tactile fremitus  No increased work of breathing. Cardiovascular:   Regular, rate and rhythm  No murmurs, ectopy or gallups  No lateral PMI. No thrills.  No lower extremity edema  Normal pedal pulses Abdomen:   Abdomen appears normal; no tenderness or masses  No hernias  No HSM Musculoskeletal:   No cyanosis, clubbing or edema Skin:   No rashes, lesions, ulcers. The patient  does have multiple tattoos on his upper extremities.  palpation of skin: no induration or nodules Neurologic:   CN 2-12 intact  Sensation all 4 extremities intact Psychiatric:   Mental status o Mood depresssed, affect flat/withdrawn o Orientation to person, place, time   judgment and insight - fair  I have personally reviewed the following:   Today's Data   CBC, Glucoses, Vitals  Scheduled Meds:  atorvastatin  80 mg Oral QHS   calcium acetate  667 mg Oral TID WC   Chlorhexidine Gluconate Cloth  6 each Topical Q0600   cyclophosphamide  150 mg Oral Daily   darbepoetin (ARANESP) injection - NON-DIALYSIS  100 mcg Subcutaneous Q Sun-1800   doxazosin  4 mg Oral Daily   ezetimibe  10 mg Oral Daily   feeding supplement (PRO-STAT SUGAR FREE 64)  30 mL Oral TID   fenofibrate  160 mg Oral Daily   insulin aspart  0-5 Units Subcutaneous QHS   insulin aspart  0-9 Units Subcutaneous TID WC   loratadine  10 mg Oral QHS   metoprolol succinate  50 mg Oral Daily   multivitamin  1 tablet Oral QHS   nicotine  21 mg Transdermal QHS   [START ON 10/15/2018] predniSONE  60 mg Oral Q breakfast   Continuous Infusions:  sodium chloride     sodium chloride     methylPREDNISolone (SOLU-MEDROL) injection 500 mg (10/13/18 1509)    Active Problems:   Acute renal failure (ARF) ([place])   History of bladder cancer   Type 2 diabetes mellitus without complication ([place])  CAD (coronary artery disease)   Uremia   AKI (acute kidney injury) ([place])   A & P   Acute renal failure (ARF) ([name])/Uremia: Creatinine today is increased to 6.16. This is up from what is assumed to be baseline creatinine of 1 on 07/19/2018. Nephrology consulted when renal insufficiency only worsened with IV fluid infusion and conservative management. SPEP was performed which demonstrated M spike and C4 complement. Immunological work up also revealed an ANA that was positive with a double-stranded DNA that was 53. The  patient underwent renal biopsy by IR on 10/10/2018. He tolerated the procedure well. 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 This will be treated with steroids and cyclophosphamide initially and then transitioned to oral prednisolone and azathioprine.  The patient is anuric and has had a tunnelled catheter placed for dialysis. He is on a MWF dialysis schedule.   Anion gap metabolic acidosis: Resolved. Likely secondary to acute kidney injury, continue dialysis.  Hyperphosphatemia: The patient has been started on PhosLo 3 times daily.  Continue to follow phosphorus closely.  History of bladder cancer: He has been encouraged strongly to follow-up with urology as an outpatient.  Diabetes mellitus type 2: FSBS 139 - 256 in the last 24 hours. The patient is receiving only correction insulin. Anticipate elevated glucoses with the initiation of steroid therapy. He will likely wind up requiring some lantus for this, but will not start it yet.  Essential Hypretension: Continue metoprolol.  Coronary artery disease: Stable continue Lipitor, currently asymptomatic hold Plavix for possible renal biopsy.  Hyperlipidemia: Continue current home medications.  Anemia of chronic kidney disease:Hemoglobin stable. Status post IV Feraheme.  Currently on EPO.  I have seen and examined this patient myself. I have spent 30 minutes in his evaluation and care.  DVT prophylaxis: SCD's Family Communication: None available. Disposition Plan/Barrier to D/C: Once acute renal failure work-up completed. Code Status: Full Code  Kenny Rea, DO [facility] Direct contact: see [email or web address]  7PM-7AM contact night coverage as above 10/12/2018, 3:24 PM  LOS: 15 days    LOS: 16 days

## 2018-10-13 NOTE — Progress Notes (Signed)
Patient ID: [name] Sloop., male   DOB: 1945/11/05, 73 y.o.   MRN: [id number] [facility] Progress Note   Assessment/ Plan:   1. 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  He remains anuric and without any evident renal recovery for which I will continue him on scheduled hemodialysis Monday/Wednesday/Friday.  No indications for plasmapheresis at this time.  Next week, we will pursue tunneled dialysis catheter placement if continues to show dense acute renal injury requiring RRT.  On HD since 4/10. 2.  Per phosphatemia: Secondary to acute kidney injury, monitor with dialysis. 3.  Hypertension: Appears to be under acceptable control on metoprolol and doxazosin and ultrafiltration at hemodialysis. 4.  Anemia: Status post intravenous iron, continue ESA.  Subjective:   Denies any acute events overnight, reports exuberant appetite this morning.   Objective:   BP 130/67 (BP Location: Left Arm)   Pulse 84   Temp (!) 97.3 F (36.3 C) (Oral)   Resp 20   Ht 5\' 10"  (1.778 m)   Wt 87.7 kg   SpO2 94%   BMI 27.74 kg/m   Intake/Output Summary (Last 24 hours) at 10/13/2018 0952 Last data filed at 10/13/2018 0835 Gross per 24 hour  Intake 543.09 ml  Output 2050 ml  Net -1506.91 ml   Weight change: 0.958 kg  Physical Exam: Gen: Resting comfortably in bed CVS: Pulse regular rhythm, normal rate Resp: Clear to auscultation, no rales.  Right IJ temporary dialysis catheter Abd: Soft, flat, nontender Ext: 1-2+ lower extremity edema.  Imaging: No results found.  Labs: BMET Recent Labs  Lab 10/07/18 0653 10/08/18 0310 10/09/18 0254 10/10/18 0354 10/11/18 0945 10/12/18 0417 10/13/18 0330  NA 137 139 136 136 133* 133* 133*  K 3.5 3.8 3.6 4.0 3.8 4.0 3.9  CL 107 104 101 102 98 99 97*  CO2 22 25 26 26 28  28 26   GLUCOSE 99 99 82 98 136* 98 177*  BUN 72* 47* 29* 37* 24* 31* 18  CREATININE 6.53* 5.23* 4.18* 5.85* 4.68* 6.16* 4.18*  CALCIUM 7.4* 7.6* 7.4* 7.4* 7.3* 7.4* 7.4*  PHOS 6.0* 4.6 3.3 5.0* 3.6 4.2 5.5*   CBC Recent Labs  Lab 10/11/18 1725 10/12/18 0417 10/12/18 1625 10/13/18 0330  WBC 4.1 4.5 Not Measured 2.4*  HGB 7.8* 7.3* Not Measured 8.3*  HCT 25.3* 23.5* Not Measured 25.6*  MCV 87.2 87.4 Not Measured 87.1  PLT 98* 105* 95* 91*    Medications:    . atorvastatin  80 mg Oral QHS  . calcium acetate  667 mg Oral TID WC  . Chlorhexidine Gluconate Cloth  6 each Topical Q0600  . cyclophosphamide  150 mg Oral Daily  . darbepoetin (ARANESP) injection - NON-DIALYSIS  100 mcg Subcutaneous Q Sun-1800  . doxazosin  4 mg Oral Daily  . ezetimibe  10 mg Oral Daily  . feeding supplement (NEPRO CARB STEADY)  237 mL Oral BID BM  . fenofibrate  160 mg Oral Daily  . insulin aspart  0-5 Units Subcutaneous QHS  . insulin aspart  0-9 Units Subcutaneous TID WC  . loratadine  10 mg Oral QHS  . metoprolol succinate  50 mg Oral Daily  . nicotine  21 mg Transdermal QHS  . [START ON 10/15/2018] predniSONE  60 mg Oral Q breakfast   Elmarie Shiley, MD 10/13/2018, 9:52 AM

## 2018-10-14 LAB — CBC
HCT: 26 % — ABNORMAL LOW (ref 39.0–52.0)
Hemoglobin: 8.5 g/dL — ABNORMAL LOW (ref 13.0–17.0)
MCH: 28.1 pg (ref 26.0–34.0)
MCHC: 32.7 g/dL (ref 30.0–36.0)
MCV: 86.1 fL (ref 80.0–100.0)
Platelets: 123 10*3/uL — ABNORMAL LOW (ref 150–400)
RBC: 3.02 MIL/uL — ABNORMAL LOW (ref 4.22–5.81)
RDW: 19.2 % — ABNORMAL HIGH (ref 11.5–15.5)
WBC: 7.2 10*3/uL (ref 4.0–10.5)
nRBC: 0 % (ref 0.0–0.2)

## 2018-10-14 LAB — RENAL FUNCTION PANEL
Albumin: 1.8 g/dL — ABNORMAL LOW (ref 3.5–5.0)
Anion gap: 6 (ref 5–15)
BUN: 41 mg/dL — ABNORMAL HIGH (ref 8–23)
CO2: 31 mmol/L (ref 22–32)
Calcium: 7.9 mg/dL — ABNORMAL LOW (ref 8.9–10.3)
Chloride: 95 mmol/L — ABNORMAL LOW (ref 98–111)
Creatinine, Ser: 5.6 mg/dL — ABNORMAL HIGH (ref 0.61–1.24)
GFR calc Af Amer: 11 mL/min — ABNORMAL LOW (ref >60)
GFR calc non Af Amer: 9 mL/min — ABNORMAL LOW (ref >60)
Glucose, Bld: 146 mg/dL — ABNORMAL HIGH (ref 70–99)
Phosphorus: 6.5 mg/dL — ABNORMAL HIGH (ref 2.5–4.6)
Potassium: 4.2 mmol/L (ref 3.5–5.1)
Sodium: 132 mmol/L — ABNORMAL LOW (ref 135–145)

## 2018-10-14 LAB — GLUCOSE, CAPILLARY
Glucose-Capillary: 132 mg/dL — ABNORMAL HIGH (ref 70–99)
Glucose-Capillary: 219 mg/dL — ABNORMAL HIGH (ref 70–99)
Glucose-Capillary: 144 mg/dL — ABNORMAL HIGH (ref 70–99)
Glucose-Capillary: 129 mg/dL — ABNORMAL HIGH (ref 70–99)

## 2018-10-14 LAB — HCV RT-PCR, QUANT (NON-GRAPH): Hepatitis C Quantitation: NOT DETECTED IU/mL

## 2018-10-14 LAB — HCV AB W REFLEX TO QUANT PCR: HCV Ab: 5.9 s/co ratio — ABNORMAL HIGH (ref 0.0–0.9)

## 2018-10-14 NOTE — Care Management Important Message (Signed)
Important Message  Patient Details  Name: [name]. MRN: [id number] Date of Birth: 07-20-1945   Medicare Important Message Given:  Yes    Vikas Wegmann 10/14/2018, 3:34 PM

## 2018-10-14 NOTE — Progress Notes (Signed)
PROGRESS NOTE  [name]. WUJ:[id number] DOB: 1945-12-26 DOA: 09/27/2018 PCP: [name], MD  Brief History   [name]. is an 73 y.o. male past medical history of diabetes mellitus, hypertension COPD history of bladder cancer was sent to the hospital from her primary care doctor's office for worsening renal failure, he relates bouts of diarrhea for the past 3 weeks. Urinalysis on admission showed 3+ proteinuria with hematuria nephrology was consulted  The patient underwent a renal biopsy on 10/10/2018.  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 They have initiated treatment with solumedrol that will be transitioned over time to oral prednisone with a predicted term of treatment of 8-12 weeks. They will also be treating the patient with Cyclophosphamide at 1.5 - 2.0 mg/kg daily for 3-6 months with eventual transition to azathioprine.  The patient states that he made a very small amount of urine this morning. This is the first he has made in a few days.  Consultants   Interventional radiology for renal biopsy  Nephrology  Procedures   Renal biopsy on 10/10/2018  Antibiotics    None  Interval History/Subjective   See above.  The patient is resting comfortably. No new complaints.  Objective   Vitals:  Vitals:   10/14/18 0515 10/14/18 0906  BP: 140/83 138/76  Pulse: 70 71  Resp: 20 18  Temp:  98 F (36.7 C)  SpO2: 94% 96%    Exam:  Constitutional:   The patient is resting quietly. No acute distress. Respiratory:   No wheezes, rales, or rhonchi.   No tactile fremitus  No increased work of breathing. Cardiovascular:   Regular, rate and rhythm  No murmurs, ectopy or gallups  No lateral PMI. No thrills.  No lower extremity edema  Normal pedal pulses Abdomen:   Abdomen appears normal; no tenderness or masses  No hernias  No  HSM Musculoskeletal:   No cyanosis, clubbing or edema Skin:   No rashes, lesions, ulcers. The patient does have multiple tattoos on his upper extremities.  palpation of skin: no induration or nodules Neurologic:   CN 2-12 intact  Sensation all 4 extremities intact Psychiatric:   Mental status o Mood depresssed, affect flat/withdrawn o Orientation to person, place, time   judgment and insight - fair  I have personally reviewed the following:   Today's Data   CBC, Glucoses, Vitals  Scheduled Meds:  atorvastatin  80 mg Oral QHS   calcium acetate  667 mg Oral TID WC   Chlorhexidine Gluconate Cloth  6 each Topical Q0600   cyclophosphamide  150 mg Oral Daily   darbepoetin (ARANESP) injection - NON-DIALYSIS  100 mcg Subcutaneous Q Sun-1800   doxazosin  4 mg Oral Daily   ezetimibe  10 mg Oral Daily   feeding supplement (PRO-STAT SUGAR FREE 64)  30 mL Oral TID   fenofibrate  160 mg Oral Daily   insulin aspart  0-5 Units Subcutaneous QHS   insulin aspart  0-9 Units Subcutaneous TID WC   loratadine  10 mg Oral QHS   metoprolol succinate  50 mg Oral Daily   multivitamin  1 tablet Oral QHS   nicotine  21 mg Transdermal QHS   [START ON 10/15/2018] predniSONE  60 mg Oral Q breakfast   Continuous Infusions:  sodium chloride     sodium chloride      Active Problems:   Acute renal failure (ARF) ([place])   History of  bladder cancer   Type 2 diabetes mellitus without complication ([name])   CAD (coronary artery disease)   Uremia   AKI (acute kidney injury) ([place])   A & P   Acute renal failure (ARF) ([name])/Uremia: Creatinine today is increased to 6.16. This is up from what is assumed to be baseline creatinine of 1 on 07/19/2018. Nephrology consulted when renal insufficiency only worsened with IV fluid infusion and conservative management. SPEP was performed which demonstrated M spike and C4 complement. Immunological work up also revealed an ANA that was positive with  a double-stranded DNA that was 53. The patient underwent renal biopsy by IR on 10/10/2018. He tolerated the procedure well. 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 This will be treated with steroids and cyclophosphamide initially and then transitioned to oral prednisolone and azathioprine.  The patient is oliguric and has had a tunnelled catheter placed for dialysis. He is on a MWF dialysis schedule. He has made a very small amount of urine today for the first time in many days.  Anion gap metabolic acidosis: Resolved. Likely secondary to acute kidney injury, continue dialysis.  Hyperphosphatemia: The patient has been started on PhosLo 3 times daily.  Continue to follow phosphorus closely.  History of bladder cancer: He has been encouraged strongly to follow-up with urology as an outpatient.  Diabetes mellitus type 2: FSBS 132 - 219 in the last 24 hours. The patient is receiving only correction insulin. Anticipate elevated glucoses with the initiation of steroid therapy. He will likely wind up requiring some lantus for this, but will not start it yet.  Essential Hypretension: Continue metoprolol.  Coronary artery disease: Stable continue Lipitor, currently asymptomatic hold Plavix for possible renal biopsy.  Hyperlipidemia: Continue current home medications.  Anemia of chronic kidney disease:Hemoglobin stable. Status post IV Feraheme.  Currently on EPO.  I have seen and examined this patient myself. I have spent 32 minutes in his evaluation and care.  DVT prophylaxis: SCD's Family Communication: None available. Disposition Plan/Barrier to D/C: Once acute renal failure work-up completed. Code Status: Full Code  [name] Iribe, DO [facility] Direct contact: see [email or web address]  7PM-7AM contact night coverage as above 10/12/2018, 3:24 PM  LOS: 15 days    LOS: 17 days

## 2018-10-14 NOTE — Progress Notes (Signed)
Patient ID: [name], male   DOB: [birth year], 73 y.o.   MRN: [id number] [facility] Progress Note   Assessment/ Plan:   1. 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  He remains anuric and without any evident renal recovery for which I will continue him on scheduled hemodialysis Monday/Wednesday/Friday dialysis anticipated today.  TDC next week if without recovery followed by OP HD placement for AKI-[facility].  On HD since 4/10. 2.  Hyperphosphatemia: Secondary to acute kidney injury, monitor with dialysis/renal diet. 3.  Hypertension: Appears to be under acceptable control on metoprolol and doxazosin and ultrafiltration at hemodialysis. 4.  Anemia: Status post intravenous iron, continue ESA.  Likely exacerbated by hematuria.  Subjective:   Complains of dark-colored urine/hematuria overnight with some urinary hesitancy-on doxazosin.   Objective:   BP 138/76 (BP Location: Left Arm)   Pulse 71   Temp 98 F (36.7 C) (Oral)   Resp 18   Ht 5\' 10"  (1.778 m)   Wt 87 kg   SpO2 96%   BMI 27.52 kg/m   Intake/Output Summary (Last 24 hours) at 10/14/2018 1051 Last data filed at 10/14/2018 0800 Gross per 24 hour  Intake 670 ml  Output 250 ml  Net 420 ml   Weight change: -2.5 kg  Physical Exam: Gen: Sitting up comfortably in recliner CVS: Pulse regular rhythm, normal rate Resp: Clear to auscultation, no rales.  Right IJ temporary dialysis catheter Abd: Soft, flat, nontender Ext: 1+ lower extremity edema.  Imaging: No results found.  Labs: BMET Recent Labs  Lab 10/08/18 0310 10/09/18 0254 10/10/18 0354 10/11/18 0945 10/12/18 0417 10/13/18 0330 10/14/18 0404  NA 139 136 136 133* 133* 133* 132*  K 3.8 3.6 4.0 3.8 4.0 3.9 4.2  CL 104 101 102 98 99 97* 95*  CO2 25 26 26 28 28 26 31    GLUCOSE 99 82 98 136* 98 177* 146*  BUN 47* 29* 37* 24* 31* 18 41*  CREATININE 5.23* 4.18* 5.85* 4.68* 6.16* 4.18* 5.60*  CALCIUM 7.6* 7.4* 7.4* 7.3* 7.4* 7.4* 7.9*  PHOS 4.6 3.3 5.0* 3.6 4.2 5.5* 6.5*   CBC Recent Labs  Lab 10/12/18 1625 10/13/18 0330 10/13/18 1722 10/14/18 0404  WBC Not Measured 2.4* 4.2 7.2  HGB Not Measured 8.3* 8.5* 8.5*  HCT Not Measured 25.6* 26.3* 26.0*  MCV Not Measured 87.1 86.5 86.1  PLT 95* 91* 114* 123*    Medications:    . atorvastatin  80 mg Oral QHS  . calcium acetate  667 mg Oral TID WC  . Chlorhexidine Gluconate Cloth  6 each Topical Q0600  . cyclophosphamide  150 mg Oral Daily  . darbepoetin (ARANESP) injection - NON-DIALYSIS  100 mcg Subcutaneous Q Sun-1800  . doxazosin  4 mg Oral Daily  . ezetimibe  10 mg Oral Daily  . feeding supplement (PRO-STAT SUGAR FREE 64)  30 mL Oral TID  . fenofibrate  160 mg Oral Daily  . insulin aspart  0-5 Units Subcutaneous QHS  . insulin aspart  0-9 Units Subcutaneous TID WC  . loratadine  10 mg Oral QHS  . metoprolol succinate  50 mg Oral Daily  . multivitamin  1 tablet Oral QHS  . nicotine  21 mg Transdermal QHS  . [START ON 10/15/2018] predniSONE  60 mg Oral Q breakfast   Elmarie Shiley, MD 10/14/2018, 10:51  AM

## 2018-10-15 LAB — CBC WITH DIFFERENTIAL/PLATELET
Abs Immature Granulocytes: 0.02 10*3/uL (ref 0.00–0.07)
Basophils Absolute: 0 10*3/uL (ref 0.0–0.1)
Basophils Relative: 0 %
Eosinophils Absolute: 0.1 10*3/uL (ref 0.0–0.5)
Eosinophils Relative: 2 %
HCT: 25.8 % — ABNORMAL LOW (ref 39.0–52.0)
Hemoglobin: 8.5 g/dL — ABNORMAL LOW (ref 13.0–17.0)
Immature Granulocytes: 0 %
Lymphocytes Relative: 8 %
Lymphs Abs: 0.5 10*3/uL — ABNORMAL LOW (ref 0.7–4.0)
MCH: 28.6 pg (ref 26.0–34.0)
MCHC: 32.9 g/dL (ref 30.0–36.0)
MCV: 86.9 fL (ref 80.0–100.0)
Monocytes Absolute: 0.3 10*3/uL (ref 0.1–1.0)
Monocytes Relative: 4 %
Neutro Abs: 5.3 10*3/uL (ref 1.7–7.7)
Neutrophils Relative %: 86 %
Platelets: 152 10*3/uL (ref 150–400)
RBC: 2.97 MIL/uL — ABNORMAL LOW (ref 4.22–5.81)
RDW: 19.9 % — ABNORMAL HIGH (ref 11.5–15.5)
WBC: 6.3 10*3/uL (ref 4.0–10.5)
nRBC: 0 % (ref 0.0–0.2)

## 2018-10-15 LAB — RENAL FUNCTION PANEL
Albumin: 1.8 g/dL — ABNORMAL LOW (ref 3.5–5.0)
Anion gap: 10 (ref 5–15)
BUN: 34 mg/dL — ABNORMAL HIGH (ref 8–23)
CO2: 26 mmol/L (ref 22–32)
Calcium: 7.4 mg/dL — ABNORMAL LOW (ref 8.9–10.3)
Chloride: 97 mmol/L — ABNORMAL LOW (ref 98–111)
Creatinine, Ser: 4.11 mg/dL — ABNORMAL HIGH (ref 0.61–1.24)
GFR calc Af Amer: 15 mL/min — ABNORMAL LOW (ref >60)
GFR calc non Af Amer: 13 mL/min — ABNORMAL LOW (ref >60)
Glucose, Bld: 161 mg/dL — ABNORMAL HIGH (ref 70–99)
Phosphorus: 5.6 mg/dL — ABNORMAL HIGH (ref 2.5–4.6)
Potassium: 4.1 mmol/L (ref 3.5–5.1)
Sodium: 133 mmol/L — ABNORMAL LOW (ref 135–145)

## 2018-10-15 LAB — GLUCOSE, CAPILLARY
Glucose-Capillary: 111 mg/dL — ABNORMAL HIGH (ref 70–99)
Glucose-Capillary: 145 mg/dL — ABNORMAL HIGH (ref 70–99)
Glucose-Capillary: 145 mg/dL — ABNORMAL HIGH (ref 70–99)
Glucose-Capillary: 119 mg/dL — ABNORMAL HIGH (ref 70–99)

## 2018-10-15 MED ORDER — FUROSEMIDE 10 MG/ML IJ SOLN
80.0000 mg | Freq: Four times a day (QID) | INTRAMUSCULAR | Status: AC
  Administered 2018-10-15 (×2): 80 mg via INTRAVENOUS
  Filled 2018-10-15 (×2): qty 8

## 2018-10-15 MED ORDER — HEPARIN SODIUM (PORCINE) 1000 UNIT/ML IJ SOLN
2800.0000 [IU] | Freq: Once | INTRAMUSCULAR | Status: AC
  Administered 2018-10-15: 2800 [IU]

## 2018-10-15 MED ORDER — HEPARIN SODIUM (PORCINE) 1000 UNIT/ML IJ SOLN
INTRAMUSCULAR | Status: AC
  Filled 2018-10-15: qty 3

## 2018-10-15 NOTE — Progress Notes (Signed)
PROGRESS NOTE  [name]. EYC:[id number] DOB: [birth year] DOA: 09/27/2018 PCP: [name], MD  Brief History   [name]. is an 73 y.o. male past medical history of diabetes mellitus, hypertension COPD history of bladder cancer was sent to the hospital from her primary care doctor's office for worsening renal failure, he relates bouts of diarrhea for the past 3 weeks. Urinalysis on admission showed 3+ proteinuria with hematuria nephrology was consulted  The patient underwent a renal biopsy on 10/10/2018.  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 They have initiated treatment with solumedrol that will be transitioned over time to oral prednisone with a predicted term of treatment of 8-12 weeks. They will also be treating the patient with Cyclophosphamide at 1.5 - 2.0 mg/kg daily for 3-6 months with eventual transition to azathioprine.  Consultants   Interventional radiology for renal biopsy  Nephrology  Procedures   Renal biopsy on 10/10/2018  Antibiotics    None  Interval History/Subjective   See above.  The patient is resting comfortably. No new complaints.  Objective   Vitals:  Vitals:   10/15/18 0430 10/15/18 0855  BP: 139/79 113/69  Pulse: 63 79  Resp: 19 18  Temp:  (!) 97.4 F (36.3 C)  SpO2: 96% 97%    Exam:  Constitutional:   The patient is resting quietly. No acute distress. Respiratory:   No wheezes, rales, or rhonchi.   No tactile fremitus  No increased work of breathing. Cardiovascular:   Regular, rate and rhythm  No murmurs, ectopy or gallups  No lateral PMI. No thrills.  No lower extremity edema  Normal pedal pulses Abdomen:   Abdomen appears normal; no tenderness or masses  No hernias  No HSM Musculoskeletal:   No cyanosis, clubbing or edema Skin:   No rashes, lesions, ulcers. The patient does have multiple  tattoos on his upper extremities.  palpation of skin: no induration or nodules Neurologic:   CN 2-12 intact  Sensation all 4 extremities intact Psychiatric:   Mental status o Mood depresssed, affect flat/withdrawn o Orientation to person, place, time   judgment and insight - fair  I have personally reviewed the following:   Today's Data   CBC, Glucoses, Vitals  Scheduled Meds:  atorvastatin  80 mg Oral QHS   calcium acetate  667 mg Oral TID WC   Chlorhexidine Gluconate Cloth  6 each Topical Q0600   cyclophosphamide  150 mg Oral Daily   darbepoetin (ARANESP) injection - NON-DIALYSIS  100 mcg Subcutaneous Q Sun-1800   doxazosin  4 mg Oral Daily   ezetimibe  10 mg Oral Daily   feeding supplement (PRO-STAT SUGAR FREE 64)  30 mL Oral TID   fenofibrate  160 mg Oral Daily   furosemide  80 mg Intravenous Q6H   heparin       insulin aspart  0-5 Units Subcutaneous QHS   insulin aspart  0-9 Units Subcutaneous TID WC   loratadine  10 mg Oral QHS   metoprolol succinate  50 mg Oral Daily   multivitamin  1 tablet Oral QHS   nicotine  21 mg Transdermal QHS   predniSONE  60 mg Oral Q breakfast   Continuous Infusions:  sodium chloride     sodium chloride      Active Problems:   Acute renal failure (ARF) ([name])   History of bladder cancer   Type 2 diabetes mellitus without complication ([place])  CAD (coronary artery disease)   Uremia   AKI (acute kidney injury) ([place])   A & P   Acute renal failure (ARF) ([name])/Uremia: Creatinine today is increased to 6.16. This is up from what is assumed to be baseline creatinine of 1 on 07/19/2018. Nephrology consulted when renal insufficiency only worsened with IV fluid infusion and conservative management. SPEP was performed which demonstrated M spike and C4 complement. Immunological work up also revealed an ANA that was positive with a double-stranded DNA that was 53. The patient underwent renal biopsy by IR on 10/10/2018. He  tolerated the procedure well. 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 This will be treated with steroids and cyclophosphamide initially and then transitioned to oral prednisolone and azathioprine.  The patient is oliguric and has had a tunnelled catheter placed for dialysis. He is on a MWF dialysis schedule. He has made a very small amount of urine today for the first time in many days.  Anion gap metabolic acidosis: Resolved. Likely secondary to acute kidney injury, continue dialysis.  Hyperphosphatemia: The patient has been started on PhosLo 3 times daily.  Continue to follow phosphorus closely.  History of bladder cancer: He has been encouraged strongly to follow-up with urology as an outpatient.  Diabetes mellitus type 2: FSBS 132 - 219 in the last 24 hours. The patient is receiving only correction insulin. Anticipate elevated glucoses with the initiation of steroid therapy. He will likely wind up requiring some lantus for this, but will not start it yet.  Essential Hypretension: Continue metoprolol.  Coronary artery disease: Stable continue Lipitor, currently asymptomatic hold Plavix for possible renal biopsy.  Hyperlipidemia: Continue current home medications.  Anemia of chronic kidney disease:Hemoglobin stable. Status post IV Feraheme.  Currently on EPO.  I have seen and examined this patient myself. I have spent 30 minutes in his evaluation and care.  DVT prophylaxis: SCD's Family Communication: None available. Disposition Plan/Barrier to D/C: Once acute renal failure work-up completed. Code Status: Full Code  [name] Rhines, DO [facility] Direct contact: see [email or web address]  7PM-7AM contact night coverage as above 10/12/2018, 3:24 PM  LOS: 15 days    LOS: 18 days

## 2018-10-15 NOTE — Progress Notes (Signed)
Patient ID: [name]., male   DOB: 08-Oct-1945, 73 y.o.   MRN: [id number] [facility] Progress Note   Assessment/ Plan:   1. 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  He has improving urine output but without any evident renal recovery for which we will continue intermittent hemodialysis-we will evaluate ability to get another dialysis treatment today for volume unloading and challenge him with furosemide.  On HD since 4/10. 2.  Hyperphosphatemia: Secondary to acute kidney injury, monitor with dialysis/renal diet. 3.  Hypertension: Appears to be under acceptable control on metoprolol and doxazosin and ultrafiltration at hemodialysis. 4.  Anemia: Status post intravenous iron, continue ESA.  Likely exacerbated by hematuria.  Subjective:   For significant dyspnea last night prior to dialysis.  Felt somewhat better after dialysis/oxygen via facemask.   Objective:   BP 113/69 (BP Location: Left Arm)   Pulse 79   Temp (!) 97.4 F (36.3 C) (Oral)   Resp 18   Ht 5\' 10"  (1.778 m)   Wt 85.8 kg   SpO2 97%   BMI 27.14 kg/m   Intake/Output Summary (Last 24 hours) at 10/15/2018 0955 Last data filed at 10/15/2018 0430 Gross per 24 hour  Intake 600 ml  Output 1587 ml  Net -987 ml   Weight change: 0.8 kg  Physical Exam: Gen: Sitting comfortably in bed CVS: Pulse regular rhythm, normal rate Resp: Fine rales over both bases, no rhonchi.  Right IJ temporary dialysis catheter Abd: Soft, flat, nontender Ext: 1+ lower extremity edema.  Imaging: No results found.  Labs: BMET Recent Labs  Lab 10/09/18 0254 10/10/18 0354 10/11/18 0945 10/12/18 0417 10/13/18 0330 10/14/18 0404  NA 136 136 133* 133* 133* 132*  K 3.6 4.0 3.8 4.0 3.9 4.2  CL 101 102 98 99 97* 95*  CO2 26 26 28 28 26 31   GLUCOSE 82  98 136* 98 177* 146*  BUN 29* 37* 24* 31* 18 41*  CREATININE 4.18* 5.85* 4.68* 6.16* 4.18* 5.60*  CALCIUM 7.4* 7.4* 7.3* 7.4* 7.4* 7.9*  PHOS 3.3 5.0* 3.6 4.2 5.5* 6.5*   CBC Recent Labs  Lab 10/12/18 1625 10/13/18 0330 10/13/18 1722 10/14/18 0404  WBC Not Measured 2.4* 4.2 7.2  HGB Not Measured 8.3* 8.5* 8.5*  HCT Not Measured 25.6* 26.3* 26.0*  MCV Not Measured 87.1 86.5 86.1  PLT 95* 91* 114* 123*    Medications:    . atorvastatin  80 mg Oral QHS  . calcium acetate  667 mg Oral TID WC  . Chlorhexidine Gluconate Cloth  6 each Topical Q0600  . cyclophosphamide  150 mg Oral Daily  . darbepoetin (ARANESP) injection - NON-DIALYSIS  100 mcg Subcutaneous Q Sun-1800  . doxazosin  4 mg Oral Daily  . ezetimibe  10 mg Oral Daily  . feeding supplement (PRO-STAT SUGAR FREE 64)  30 mL Oral TID  . fenofibrate  160 mg Oral Daily  . heparin      . insulin aspart  0-5 Units Subcutaneous QHS  . insulin aspart  0-9 Units Subcutaneous TID WC  . loratadine  10 mg Oral QHS  . metoprolol succinate  50 mg Oral Daily  . multivitamin  1 tablet Oral QHS  . nicotine  21 mg Transdermal QHS  . predniSONE  60 mg Oral Q breakfast   Elmarie Shiley, MD 10/15/2018, 9:55 AM

## 2018-10-16 LAB — GLUCOSE, CAPILLARY
Glucose-Capillary: 104 mg/dL — ABNORMAL HIGH (ref 70–99)
Glucose-Capillary: 156 mg/dL — ABNORMAL HIGH (ref 70–99)
Glucose-Capillary: 161 mg/dL — ABNORMAL HIGH (ref 70–99)
Glucose-Capillary: 216 mg/dL — ABNORMAL HIGH (ref 70–99)

## 2018-10-16 LAB — RENAL FUNCTION PANEL
Albumin: 1.7 g/dL — ABNORMAL LOW (ref 3.5–5.0)
Anion gap: 9 (ref 5–15)
BUN: 31 mg/dL — ABNORMAL HIGH (ref 8–23)
CO2: 28 mmol/L (ref 22–32)
Calcium: 7.3 mg/dL — ABNORMAL LOW (ref 8.9–10.3)
Chloride: 98 mmol/L (ref 98–111)
Creatinine, Ser: 3.08 mg/dL — ABNORMAL HIGH (ref 0.61–1.24)
GFR calc Af Amer: 22 mL/min — ABNORMAL LOW (ref >60)
GFR calc non Af Amer: 19 mL/min — ABNORMAL LOW (ref >60)
Glucose, Bld: 90 mg/dL (ref 70–99)
Phosphorus: 4.9 mg/dL — ABNORMAL HIGH (ref 2.5–4.6)
Potassium: 3.8 mmol/L (ref 3.5–5.1)
Sodium: 135 mmol/L (ref 135–145)

## 2018-10-16 NOTE — Progress Notes (Signed)
Patient educated about using urinal to measure urine output correctly. Patient stated, "Its hard to aim and hold." Patient instructed to call for assistance when he has to urinate.

## 2018-10-16 NOTE — Progress Notes (Signed)
VAST RN to pt's bedside to change HD dsg due to drainage. Pt just received lunch tray and asked that RN come back later to complete dsg change.

## 2018-10-16 NOTE — Progress Notes (Signed)
[place] due for dressing change. On arrival, pt up eating breakfast.

## 2018-10-16 NOTE — Progress Notes (Signed)
Patient ID: [name]., male   DOB: 1945/09/30, 73 y.o.   MRN: [id number]   PROGRESS NOTE    [name].  SAY:[id number] DOB: 1945-10-26 DOA: 09/27/2018 PCP: [name], MD (  Brief Narrative:  [name] Jr.is an 73 y.o.malepast medical history of diabetes mellitus, hypertension COPD history of bladder cancer was sent to the hospital from her primary care doctor's office for worsening renal failure, he relates bouts of diarrhea for the past 3 weeks. Urinalysis on admission showed 3+ proteinuria with hematuria nephrology was consulted  The patient underwent a renal biopsy on 10/10/2018.  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 They have initiated treatment with solumedrol that will be transitioned over time to oral prednisone with a predicted term of treatment of 8-12 weeks. They will also be treating the patient with Cyclophosphamide at 1.5 - 2.0 mg/kg daily for 3-6 months with eventual transition to azathioprine.   Assessment & Plan:   Active Problems:   Acute renal failure (ARF) ([name])   History of bladder cancer   Type 2 diabetes mellitus without complication ([name])   CAD (coronary artery disease)   Uremia   AKI (acute kidney injury) ([place])  1. Acute renal failure with uremia 1. Creatinine 3.08, which is improved from 4.11 today.   2. Pauci-immune glomerulonephritis 3. Continue glucocorticoids and cyclophosphamide 4. Nephrology continues to be intimately involved.  Per my conversation with Dr. Posey Pronto, patient not ready for discharge currently has patient still needs frequent dialysis and has no permanent access. 2. Anion gap metabolic acidosis: Likely secondary to acute kidney injury.  Resolved. 3. Hyperphosphatemia 1. Started on PhosLo 3 times daily 4. Diabetes 1. Blood sugars ranging 104 -145. 2. Continue current management   DVT prophylaxis: Heparin Code  Status: Full Family Communication: none Disposition Plan: Following stabilization, the patient should be able to return home with outpatient dialysis   Consultants:   Nephrology   Antimicrobials: None   Subjective: Patient feeling well.  Produce some urine last night with Lasix challenge.  Patient is anxious for discharge.  Denies abdominal pain, nausea, vomiting, diarrhea,  Objective: Vitals:   10/15/18 2115 10/15/18 2139 10/16/18 0615 10/16/18 0855  BP: 100/65 130/67 116/72 96/70  Pulse: 65 63 75 78  Resp:  17 16 18   Temp:  98.5 F (36.9 C) (!) 97.4 F (36.3 C) (!) 97.4 F (36.3 C)  TempSrc:   Oral Oral  SpO2: 97% 97% 94% 97%  Weight: 84.3 kg     Height:        Intake/Output Summary (Last 24 hours) at 10/16/2018 0947 Last data filed at 10/16/2018 0835 Gross per 24 hour  Intake 720 ml  Output 2500 ml  Net -1780 ml   Filed Weights   10/15/18 0309 10/15/18 1745 10/15/18 2115  Weight: 85.8 kg 86.8 kg 84.3 kg    Examination:  General exam: Appears calm and comfortable  Respiratory system: Clear to auscultation. Respiratory effort normal. Cardiovascular system: S1 & S2 heard, RRR. No JVD, murmurs, rubs, gallops or clicks. No pedal edema. Gastrointestinal system: Abdomen is nondistended, soft and nontender. No organomegaly or masses felt. Normal bowel sounds heard. Central nervous system: Alert and oriented. No focal neurological deficits. Extremities: Symmetric 5 x 5 power. Skin: No rashes, lesions or ulcers Psychiatry: Judgement and insight appear normal. Mood & affect appropriate.     Data Reviewed: I have personally reviewed following labs and imaging studies  CBC:  Recent Labs  Lab 10/12/18 1625 10/13/18 0330 10/13/18 1722 10/14/18 0404 10/15/18 1041  WBC Not Measured 2.4* 4.2 7.2 6.3  NEUTROABS  --   --   --   --  5.3  HGB Not Measured 8.3* 8.5* 8.5* 8.5*  HCT Not Measured 25.6* 26.3* 26.0* 25.8*  MCV Not Measured 87.1 86.5 86.1 86.9  PLT 95* 91*  114* 123* 466   Basic Metabolic Panel: Recent Labs  Lab 10/12/18 0417 10/13/18 0330 10/14/18 0404 10/15/18 1041 10/16/18 0406  NA 133* 133* 132* 133* 135  K 4.0 3.9 4.2 4.1 3.8  CL 99 97* 95* 97* 98  CO2 28 26 31 26 28   GLUCOSE 98 177* 146* 161* 90  BUN 31* 18 41* 34* 31*  CREATININE 6.16* 4.18* 5.60* 4.11* 3.08*  CALCIUM 7.4* 7.4* 7.9* 7.4* 7.3*  PHOS 4.2 5.5* 6.5* 5.6* 4.9*   GFR: Estimated Creatinine Clearance: 21.7 mL/min (A) (by C-G formula based on SCr of 3.08 mg/dL (H)). Liver Function Tests: Recent Labs  Lab 10/12/18 0417 10/13/18 0330 10/14/18 0404 10/15/18 1041 10/16/18 0406  ALBUMIN 1.4* 1.6* 1.8* 1.8* 1.7*   No results for input(s): LIPASE, AMYLASE in the last 168 hours. No results for input(s): AMMONIA in the last 168 hours. Coagulation Profile: Recent Labs  Lab 10/10/18 1109  INR 1.5*   Cardiac Enzymes: No results for input(s): CKTOTAL, CKMB, CKMBINDEX, TROPONINI in the last 168 hours. BNP (last 3 results) No results for input(s): PROBNP in the last 8760 hours. HbA1C: No results for input(s): HGBA1C in the last 72 hours. CBG: Recent Labs  Lab 10/15/18 0657 10/15/18 1118 10/15/18 1632 10/15/18 2139 10/16/18 0617  GLUCAP 111* 145* 145* 119* 104*   Lipid Profile: No results for input(s): CHOL, HDL, LDLCALC, TRIG, CHOLHDL, LDLDIRECT in the last 72 hours. Thyroid Function Tests: No results for input(s): TSH, T4TOTAL, FREET4, T3FREE, THYROIDAB in the last 72 hours. Anemia Panel: No results for input(s): VITAMINB12, FOLATE, FERRITIN, TIBC, IRON, RETICCTPCT in the last 72 hours. Sepsis Labs: No results for input(s): PROCALCITON, LATICACIDVEN in the last 168 hours.  No results found for this or any previous visit (from the past 240 hour(s)).       Radiology Studies: No results found.      Scheduled Meds: . atorvastatin  80 mg Oral QHS  . calcium acetate  667 mg Oral TID WC  . Chlorhexidine Gluconate Cloth  6 each Topical Q0600  .  cyclophosphamide  150 mg Oral Daily  . darbepoetin (ARANESP) injection - NON-DIALYSIS  100 mcg Subcutaneous Q Sun-1800  . doxazosin  4 mg Oral Daily  . ezetimibe  10 mg Oral Daily  . feeding supplement (PRO-STAT SUGAR FREE 64)  30 mL Oral TID  . fenofibrate  160 mg Oral Daily  . insulin aspart  0-5 Units Subcutaneous QHS  . insulin aspart  0-9 Units Subcutaneous TID WC  . loratadine  10 mg Oral QHS  . metoprolol succinate  50 mg Oral Daily  . multivitamin  1 tablet Oral QHS  . nicotine  21 mg Transdermal QHS  . predniSONE  60 mg Oral Q breakfast   Continuous Infusions: . sodium chloride    . sodium chloride       LOS: 19 days    Time spent: [place], MD [facility] 10/16/2018, 9:47 AM

## 2018-10-16 NOTE — Progress Notes (Signed)
Patient ID: [name] Sloop., male   DOB: 08-Jul-1945, 73 y.o.   MRN: [id number] [facility] Progress Note   Assessment/ Plan:   1. Acute kidney Injury on chronic kidney disease stage III: Renal biopsy from Monday showed pauci-immune glomerulonephritis with minimal scarring for which he is on glucocorticoids (prednisone which needs to be tapered to off over the next 8 to 12 weeks) and oral cyclophosphamide for 3 to 6 months based on response.  Urine output poorly charted-states that he is voiding more.  Underwent hemodialysis on Friday and Saturday for volume excess.  We will continue to follow his labs closely anticipating renal recovery but begin parallel preparations for outpatient dialysis by having his right IJ temporary catheter converted to a TDC potentially tomorrow by IR-he lives closest to the [place] and may be placed there with AKI.  On HD since 4/10. 2.  Hyperphosphatemia: Secondary to acute kidney injury, improving with hemodialysis/renal diet. 3.  Hypertension: Pressures well controlled, may need to decrease dosing of doxazosin if remains low. 4.  Anemia: Status post intravenous iron, continue ESA especially in the setting of ongoing cyclophosphamide use  Subjective:   For significant dyspnea last night prior to dialysis.  Felt somewhat better after dialysis/oxygen via facemask.   Objective:   BP 96/70 (BP Location: Right Arm)   Pulse 78   Temp (!) 97.4 F (36.3 C) (Oral)   Resp 18   Ht 5\' 10"  (1.778 m)   Wt 84.3 kg   SpO2 97%   BMI 26.67 kg/m   Intake/Output Summary (Last 24 hours) at 10/16/2018 1000 Last data filed at 10/16/2018 0835 Gross per 24 hour  Intake 720 ml  Output 2500 ml  Net -1780 ml   Weight change: -1 kg  Physical Exam: Gen: Sitting comfortably in recliner, watching television CVS: Pulse regular rhythm, normal rate Resp: Clear to auscultation bilaterally, no rales/rhonchi.  Right IJ temporary dialysis catheter Abd: Soft,  flat, nontender Ext: 1+ right ankle edema, trace-1+ left ankle edema  Imaging: No results found.  Labs: BMET Recent Labs  Lab 10/10/18 0354 10/11/18 0945 10/12/18 0417 10/13/18 0330 10/14/18 0404 10/15/18 1041 10/16/18 0406  NA 136 133* 133* 133* 132* 133* 135  K 4.0 3.8 4.0 3.9 4.2 4.1 3.8  CL 102 98 99 97* 95* 97* 98  CO2 26 28 28 26 31 26 28   GLUCOSE 98 136* 98 177* 146* 161* 90  BUN 37* 24* 31* 18 41* 34* 31*  CREATININE 5.85* 4.68* 6.16* 4.18* 5.60* 4.11* 3.08*  CALCIUM 7.4* 7.3* 7.4* 7.4* 7.9* 7.4* 7.3*  PHOS 5.0* 3.6 4.2 5.5* 6.5* 5.6* 4.9*   CBC Recent Labs  Lab 10/13/18 0330 10/13/18 1722 10/14/18 0404 10/15/18 1041  WBC 2.4* 4.2 7.2 6.3  NEUTROABS  --   --   --  5.3  HGB 8.3* 8.5* 8.5* 8.5*  HCT 25.6* 26.3* 26.0* 25.8*  MCV 87.1 86.5 86.1 86.9  PLT 91* 114* 123* 152    Medications:    . atorvastatin  80 mg Oral QHS  . calcium acetate  667 mg Oral TID WC  . Chlorhexidine Gluconate Cloth  6 each Topical Q0600  . cyclophosphamide  150 mg Oral Daily  . darbepoetin (ARANESP) injection - NON-DIALYSIS  100 mcg Subcutaneous Q Sun-1800  . doxazosin  4 mg Oral Daily  . ezetimibe  10 mg Oral Daily  . feeding supplement (PRO-STAT SUGAR FREE 64)  30 mL Oral TID  . fenofibrate  160  mg Oral Daily  . insulin aspart  0-5 Units Subcutaneous QHS  . insulin aspart  0-9 Units Subcutaneous TID WC  . loratadine  10 mg Oral QHS  . metoprolol succinate  50 mg Oral Daily  . multivitamin  1 tablet Oral QHS  . nicotine  21 mg Transdermal QHS  . predniSONE  60 mg Oral Q breakfast   Elmarie Shiley, MD 10/16/2018, 10:00 AM

## 2018-10-17 ENCOUNTER — Encounter (HOSPITAL_COMMUNITY): Payer: Self-pay | Admitting: Interventional Radiology

## 2018-10-17 ENCOUNTER — Inpatient Hospital Stay (HOSPITAL_COMMUNITY): Payer: Medicare HMO

## 2018-10-17 HISTORY — PX: IR US GUIDE VASC ACCESS RIGHT: IMG2390

## 2018-10-17 HISTORY — PX: IR FLUORO GUIDE CV LINE RIGHT: IMG2283

## 2018-10-17 LAB — GLUCOSE, CAPILLARY
Glucose-Capillary: 95 mg/dL (ref 70–99)
Glucose-Capillary: 120 mg/dL — ABNORMAL HIGH (ref 70–99)
Glucose-Capillary: 113 mg/dL — ABNORMAL HIGH (ref 70–99)
Glucose-Capillary: 287 mg/dL — ABNORMAL HIGH (ref 70–99)

## 2018-10-17 LAB — RENAL FUNCTION PANEL
Albumin: 1.7 g/dL — ABNORMAL LOW (ref 3.5–5.0)
Anion gap: 7 (ref 5–15)
BUN: 68 mg/dL — ABNORMAL HIGH (ref 8–23)
CO2: 27 mmol/L (ref 22–32)
Calcium: 7.4 mg/dL — ABNORMAL LOW (ref 8.9–10.3)
Chloride: 99 mmol/L (ref 98–111)
Creatinine, Ser: 4.7 mg/dL — ABNORMAL HIGH (ref 0.61–1.24)
GFR calc Af Amer: 13 mL/min — ABNORMAL LOW (ref >60)
GFR calc non Af Amer: 11 mL/min — ABNORMAL LOW (ref >60)
Glucose, Bld: 102 mg/dL — ABNORMAL HIGH (ref 70–99)
Phosphorus: 6 mg/dL — ABNORMAL HIGH (ref 2.5–4.6)
Potassium: 4.1 mmol/L (ref 3.5–5.1)
Sodium: 133 mmol/L — ABNORMAL LOW (ref 135–145)

## 2018-10-17 MED ORDER — LIDOCAINE HCL 1 % IJ SOLN
INTRAMUSCULAR | Status: AC | PRN
  Administered 2018-10-17: 10 mL

## 2018-10-17 MED ORDER — MIDAZOLAM HCL 2 MG/2ML IJ SOLN
INTRAMUSCULAR | Status: AC | PRN
  Administered 2018-10-17 (×2): 0.5 mg via INTRAVENOUS

## 2018-10-17 MED ORDER — CEFAZOLIN SODIUM-DEXTROSE 2-4 GM/100ML-% IV SOLN
INTRAVENOUS | Status: AC
  Administered 2018-10-17: 2 g via INTRAVENOUS
  Filled 2018-10-17: qty 100

## 2018-10-17 MED ORDER — CHLORHEXIDINE GLUCONATE CLOTH 2 % EX PADS: 6.0000 | MEDICATED_PAD | Freq: Every day | CUTANEOUS | Status: DC

## 2018-10-17 MED ORDER — CEFAZOLIN SODIUM-DEXTROSE 2-4 GM/100ML-% IV SOLN
2.0000 g | Freq: Once | INTRAVENOUS | Status: AC
  Administered 2018-10-17: 15:00:00 2 g via INTRAVENOUS
  Filled 2018-10-17: qty 100

## 2018-10-17 MED ORDER — HEPARIN SODIUM (PORCINE) 1000 UNIT/ML IJ SOLN
INTRAMUSCULAR | Status: AC
  Administered 2018-10-17: 3.8 mL
  Filled 2018-10-17: qty 1

## 2018-10-17 MED ORDER — FENTANYL CITRATE (PF) 100 MCG/2ML IJ SOLN
INTRAMUSCULAR | Status: AC
  Filled 2018-10-17: qty 2

## 2018-10-17 MED ORDER — LIDOCAINE HCL 1 % IJ SOLN
INTRAMUSCULAR | Status: AC
  Filled 2018-10-17: qty 20

## 2018-10-17 MED ORDER — MIDAZOLAM HCL 2 MG/2ML IJ SOLN
INTRAMUSCULAR | Status: AC
  Filled 2018-10-17: qty 2

## 2018-10-17 MED ORDER — FENTANYL CITRATE (PF) 100 MCG/2ML IJ SOLN
INTRAMUSCULAR | Status: AC | PRN
  Administered 2018-10-17: 25 ug via INTRAVENOUS

## 2018-10-17 MED ORDER — CHLORHEXIDINE GLUCONATE 4 % EX LIQD
CUTANEOUS | Status: AC
  Filled 2018-10-17: qty 15

## 2018-10-17 NOTE — Progress Notes (Signed)
Patient ID: [name]., male   DOB: 1945/07/10, 73 y.o.   MRN: [id number] [facility] Progress Note   Assessment/ Plan:   1. Acute kidney Injury on chronic kidney disease stage III:  Renal biopsy from Monday showed pauci-immune glomerulonephritis with minimal scarring for which he is on glucocorticoids (prednisone which needs to be tapered to off over the next 8 to 12 weeks) and oral cyclophosphamide for 3 to 6 months based on response.   - He is not yet cleared for discharge from a renal standpoint.  We are monitoring dialysis needs and urine output daily  - On HD since 4/10.  S/p HD on 4/17 and 4/18 for volume excess - Assess dialysis needs daily.  Plan next HD on 4/21. - Hopeful for renal recovery  - Consulted IR for tunneled dialysis catheter.   - Note that he lives closest to the [place] and may be placed there with AKI.  Spoke with [facility] today regarding initiating process for outpatient placement  2. Hyperphosphatemia: Secondary to acute kidney injury; on hemodialysis/renal diet.  3.  Hypertension: controlled  4.  Anemia: Status post intravenous iron, continue ESA especially in the setting of ongoing cyclophosphamide use  Subjective:   Pt is to have his tunneled dialysis catheter placed today with removal of vascath.  He states that he lives nearest to [place] center.  He was under the impression that he was going home tomorrow and we clarified that he is not cleared for discharge from a renal standpoint.  He states that he is making some urine - none is charted and see nursing note re: patient unable to collect easily.  He is a poor historian.  Lives alone and states that daughter is nearby and checks on him as well as some type of home health.   Objective:   BP 135/76 (BP Location: Left Arm)   Pulse 81   Temp (!) 97.4 F (36.3 C) (Oral)   Resp 18   Ht 5\' 10"  (1.778 m)   Wt 84.3 kg   SpO2 100%   BMI 26.67 kg/m   Intake/Output Summary  (Last 24 hours) at 10/17/2018 1055 Last data filed at 10/17/2018 0900 Gross per 24 hour  Intake 360 ml  Output 0 ml  Net 360 ml   Weight change:   Physical Exam:  Gen: elderly male in bed in no acute distress  CVS: Pulse regular rhythm, normal rate Resp: Clear to auscultation bilaterally, no rales/rhonchi.   Access: Right IJ temporary dialysis catheter Abd: Soft, flat, nontender Ext: edema bilateral lower extremities   Imaging: No results found.  Labs: BMET Recent Labs  Lab 10/11/18 0945 10/12/18 0417 10/13/18 0330 10/14/18 0404 10/15/18 1041 10/16/18 0406 10/17/18 0411  NA 133* 133* 133* 132* 133* 135 133*  K 3.8 4.0 3.9 4.2 4.1 3.8 4.1  CL 98 99 97* 95* 97* 98 99  CO2 28 28 26 31 26 28 27   GLUCOSE 136* 98 177* 146* 161* 90 102*  BUN 24* 31* 18 41* 34* 31* 68*  CREATININE 4.68* 6.16* 4.18* 5.60* 4.11* 3.08* 4.70*  CALCIUM 7.3* 7.4* 7.4* 7.9* 7.4* 7.3* 7.4*  PHOS 3.6 4.2 5.5* 6.5* 5.6* 4.9* 6.0*   CBC Recent Labs  Lab 10/13/18 0330 10/13/18 1722 10/14/18 0404 10/15/18 1041  WBC 2.4* 4.2 7.2 6.3  NEUTROABS  --   --   --  5.3  HGB 8.3* 8.5* 8.5* 8.5*  HCT 25.6* 26.3* 26.0* 25.8*  MCV 87.1  86.5 86.1 86.9  PLT 91* 114* 123* 152    Medications:    . atorvastatin  80 mg Oral QHS  . calcium acetate  667 mg Oral TID WC  . Chlorhexidine Gluconate Cloth  6 each Topical Q0600  . cyclophosphamide  150 mg Oral Daily  . darbepoetin (ARANESP) injection - NON-DIALYSIS  100 mcg Subcutaneous Q Sun-1800  . doxazosin  4 mg Oral Daily  . ezetimibe  10 mg Oral Daily  . feeding supplement (PRO-STAT SUGAR FREE 64)  30 mL Oral TID  . fenofibrate  160 mg Oral Daily  . insulin aspart  0-5 Units Subcutaneous QHS  . insulin aspart  0-9 Units Subcutaneous TID WC  . loratadine  10 mg Oral QHS  . metoprolol succinate  50 mg Oral Daily  . multivitamin  1 tablet Oral QHS  . nicotine  21 mg Transdermal QHS  . predniSONE  60 mg Oral Q breakfast    Claudia Desanctis 10/17/2018 10:55  am

## 2018-10-17 NOTE — Progress Notes (Signed)
Dialysis Coordinator received call from Dr. Bartholomew Boards to start referral process for OP HD seat. Dialysis Coordinator spoke with patient who confirmed address in [facility] (same address as on file in [place]) and that he drives and has support from his daughter [name] who can transport him if needed. He states he would like to go to [facility] in [place] for OP HD and commented that his wife dialyzed at this center prior to her passing a few years ago. He is, therefore, familiar with the location of the center. It is also the closest center to his home. He states he lives independently with help from his daughter. His daughter [name] can be reached at [phone].  Dialysis Coordinator will follow up with patient and Nephrologist once an OP HD seat is secured.  [name] Dialysis Coordinator [phone]

## 2018-10-17 NOTE — Progress Notes (Signed)
10/17/18 1700  Clinical Encounter Type  Visited With Patient  Visit Type Initial  Spiritual Encounters  Spiritual Needs Emotional  Stress Factors  Patient Stress Factors Health changes;Loss of control;Major life changes   Met w/ pt, used mix of written word and speech to communicate d/t his difficult hearing.  Supported pt's lament about spending 3 weeks in hospital, the multiple types of cancer he has, and other challenges.  He is anticipating his d/c so he can be w/ his three dogs.  Andrea M Davis Chaplain resident, [phone] 
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
## 2018-10-17 NOTE — Progress Notes (Signed)
[facility] Admissions Coordinator updated by Dialysis Coordinator that patient now has [place] and CXR. Dialysis Coordinator to continue to follow until OP HD seat is secured and patient is discharged from [place], [place] Dialysis Coordinator [phone]

## 2018-10-17 NOTE — Plan of Care (Signed)
  Problem: Activity: Goal: Risk for activity intolerance will decrease Outcome: Progressing   Problem: Elimination: Goal: Will not experience complications related to urinary retention Outcome: Progressing   Problem: Elimination: Goal: Will not experience complications related to bowel motility Outcome: Progressing

## 2018-10-17 NOTE — Progress Notes (Signed)
PROGRESS NOTE  [name]. FUX:[id number] DOB: 08-Sep-1945 DOA: 09/27/2018 PCP: [name], MD  HPI/Recap of past 65 hours: 73 year old male with past medical history of diabetes mellitus, hypertension and COPD plus history of bladder cancer admitted on 3/31 after coming in with worsening renal failure after being sent over from his primary care doctor's office.  Patient at that time related bouts of diarrhea for the past 3 weeks and also found to have a urinalysis with 3+ proteinuria and hematuria.  Nephrology consulted and patient went renal biopsy on 4/13.  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  Nephrology started Solu-Medrol as well as cyclophosphamide.  Started on dialysis on 4/10.  No events overnight.  Patient currently resting comfortably with no complaints  Assessment/Plan: Active Problems:   Acute renal failure (ARF) ([place]), Now with end-stage renal disease.  Patient still needs frequent dialysis and has not yet permanent access, so continues to need to be inpatient.  Nephrology following.  On glucocorticoids which will eventually be changed to oral prednisone for 2 to 3 months and continue on cyclophosphamide for 3 to 6 months with eventual transition to azathioprine.  Plan is for interventional radiology to convert his right IJ temporary catheter to a TDC possibly today or tomorrow   History of bladder cancer: Stable   Type 2 diabetes mellitus without complication ([place]): Blood sugars remained stable   CAD (coronary artery disease): Stable   Uremia: Looks to be resolving with episodes of dialysis  Acute respiratory failure with hypoxia: Secondary to volume overload.  Resolved with dialysis, currently on room air  Code Status: Full code  Family Communication: Did not respond when asked me if I should call family  Disposition Plan: Home once dialysis stabilized and at dialysis access established   Consultants:  Nephrology  Interventional radiology  Procedures:  Renal biopsy done 4/13  Right IJ catheter placement  Plan conversion of right IJ catheter to [facility]  Antimicrobials:  None  DVT prophylaxis: Subcu heparin   Objective: Vitals:   10/17/18 0504 10/17/18 0825  BP: (!) 153/81 135/76  Pulse: 81 81  Resp: 20 18  Temp: (!) 97.5 F (36.4 C) (!) 97.4 F (36.3 C)  SpO2: 98% 100%    Intake/Output Summary (Last 24 hours) at 10/17/2018 1429 Last data filed at 10/17/2018 1300 Gross per 24 hour  Intake 240 ml  Output 0 ml  Net 240 ml   Filed Weights   10/15/18 0309 10/15/18 1745 10/15/18 2115  Weight: 85.8 kg 86.8 kg 84.3 kg   Body mass index is 26.67 kg/m.  Exam:   General: Alert and oriented x3, no acute distress  HEENT: Normocephalic and atraumatic, mucous membranes are slightly dry  Neck: Supple, no JVD.  IJ catheter noted on right  Cardiovascular: Regular rate and rhythm, S1-S2  Respiratory: Clear to auscultation bilaterally  Abdomen: Soft, nontender, nondistended, positive bowel sounds  Musculoskeletal: No clubbing or cyanosis, trace pitting edema  Skin: No skin breaks, tears or lesions, multiple tattoos on torso and arms  Psychiatry: Appropriate, no evidence of psychoses   Data Reviewed: CBC: Recent Labs  Lab 10/12/18 1625 10/13/18 0330 10/13/18 1722 10/14/18 0404 10/15/18 1041  WBC Not Measured 2.4* 4.2 7.2 6.3  NEUTROABS  --   --   --   --  5.3  HGB Not Measured 8.3* 8.5* 8.5* 8.5*  HCT Not Measured 25.6* 26.3* 26.0* 25.8*  MCV Not Measured 87.1 86.5 86.1 86.9  PLT 95*  91* 114* 123* 546   Basic Metabolic Panel: Recent Labs  Lab 10/13/18 0330 10/14/18 0404 10/15/18 1041 10/16/18 0406 10/17/18 0411  NA 133* 132* 133* 135 133*  K 3.9 4.2 4.1 3.8 4.1  CL 97* 95* 97* 98 99  CO2 26 31 26 28 27   GLUCOSE 177* 146* 161* 90 102*  BUN 18 41* 34* 31* 68*  CREATININE 4.18* 5.60* 4.11* 3.08* 4.70*  CALCIUM 7.4* 7.9* 7.4*  7.3* 7.4*  PHOS 5.5* 6.5* 5.6* 4.9* 6.0*   GFR: Estimated Creatinine Clearance: 14.2 mL/min (A) (by C-G formula based on SCr of 4.7 mg/dL (H)). Liver Function Tests: Recent Labs  Lab 10/13/18 0330 10/14/18 0404 10/15/18 1041 10/16/18 0406 10/17/18 0411  ALBUMIN 1.6* 1.8* 1.8* 1.7* 1.7*   No results for input(s): LIPASE, AMYLASE in the last 168 hours. No results for input(s): AMMONIA in the last 168 hours. Coagulation Profile: No results for input(s): INR, PROTIME in the last 168 hours. Cardiac Enzymes: No results for input(s): CKTOTAL, CKMB, CKMBINDEX, TROPONINI in the last 168 hours. BNP (last 3 results) No results for input(s): PROBNP in the last 8760 hours. HbA1C: No results for input(s): HGBA1C in the last 72 hours. CBG: Recent Labs  Lab 10/16/18 1116 10/16/18 1634 10/16/18 2114 10/17/18 0726 10/17/18 1144  GLUCAP 156* 161* 216* 95 120*   Lipid Profile: No results for input(s): CHOL, HDL, LDLCALC, TRIG, CHOLHDL, LDLDIRECT in the last 72 hours. Thyroid Function Tests: No results for input(s): TSH, T4TOTAL, FREET4, T3FREE, THYROIDAB in the last 72 hours. Anemia Panel: No results for input(s): VITAMINB12, FOLATE, FERRITIN, TIBC, IRON, RETICCTPCT in the last 72 hours. Urine analysis:    Component Value Date/Time   COLORURINE RED (A) 10/08/2018 1613   APPEARANCEUR CLOUDY (A) 10/08/2018 1613   LABSPEC 1.010 10/08/2018 1613   PHURINE 7.0 10/08/2018 1613   GLUCOSEU 100 (A) 10/08/2018 1613   HGBUR LARGE (A) 10/08/2018 1613   BILIRUBINUR NEGATIVE 10/08/2018 1613   KETONESUR NEGATIVE 10/08/2018 1613   PROTEINUR >300 (A) 10/08/2018 1613   NITRITE NEGATIVE 10/08/2018 1613   LEUKOCYTESUR TRACE (A) 10/08/2018 1613   Sepsis Labs: @LABRCNTIP (procalcitonin:4,lacticidven:4)  )No results found for this or any previous visit (from the past 240 hour(s)).    Studies: No results found.  Scheduled Meds: . atorvastatin  80 mg Oral QHS  . calcium acetate  667 mg Oral TID  WC  . Chlorhexidine Gluconate Cloth  6 each Topical Q0600  . cyclophosphamide  150 mg Oral Daily  . darbepoetin (ARANESP) injection - NON-DIALYSIS  100 mcg Subcutaneous Q Sun-1800  . doxazosin  4 mg Oral Daily  . ezetimibe  10 mg Oral Daily  . feeding supplement (PRO-STAT SUGAR FREE 64)  30 mL Oral TID  . fenofibrate  160 mg Oral Daily  . insulin aspart  0-5 Units Subcutaneous QHS  . insulin aspart  0-9 Units Subcutaneous TID WC  . loratadine  10 mg Oral QHS  . metoprolol succinate  50 mg Oral Daily  . multivitamin  1 tablet Oral QHS  . nicotine  21 mg Transdermal QHS  . predniSONE  60 mg Oral Q breakfast    Continuous Infusions: . sodium chloride    . sodium chloride       LOS: 20 days     Annita Brod, MD [facility]  To reach me or the doctor on call, go to: [email or web address] Password [facility]  10/17/2018, 2:29 PM

## 2018-10-17 NOTE — TOC Benefit Eligibility Note (Signed)
Transition of Care [facility]) Benefit Eligibility Note    Patient Details  Name: [name]. MRN: [id number] Date of Birth: 1945/07/29   Medication/Dose: CYTOXAN 150 MG DAILY(NON-FORMULARY)  Covered?: No(    )     Prescription Coverage Preferred Pharmacy: YES([facility])  Spoke with Person/Company/Phone Number:: TAMARA([employer] # [phone])     Prior Approval: Yes(#  [phone])     Additional Notes: CYCLOPHOSPHAMIDE  50 MG  3 X A DAY (PRIOR APPROVAL- YES # [phone])    Memory Argue Phone Number: 10/17/2018, 2:12 PM

## 2018-10-17 NOTE — Procedures (Signed)
Interventional Radiology Procedure Note  Procedure: Tunneled HD catheter placement  Complications: None  Estimated Blood Loss: < 10 mL  Findings: Right IJ tunneled Palindrome catheter placed measuring 23 cm from tip to cuff. Tip in RA. OK to use.  Venetia Night. Kathlene Cote, M.D Pager:  [phone]

## 2018-10-17 NOTE — Sedation Documentation (Signed)
Patient was given 1 mg for procdure, but 2 mg of versed was wasted instead 1 mg in Pxyis.

## 2018-10-18 LAB — RENAL FUNCTION PANEL
Albumin: 2 g/dL — ABNORMAL LOW (ref 3.5–5.0)
Anion gap: 14 (ref 5–15)
BUN: 96 mg/dL — ABNORMAL HIGH (ref 8–23)
CO2: 24 mmol/L (ref 22–32)
Calcium: 8 mg/dL — ABNORMAL LOW (ref 8.9–10.3)
Chloride: 99 mmol/L (ref 98–111)
Creatinine, Ser: 6.31 mg/dL — ABNORMAL HIGH (ref 0.61–1.24)
GFR calc Af Amer: 9 mL/min — ABNORMAL LOW (ref >60)
GFR calc non Af Amer: 8 mL/min — ABNORMAL LOW (ref >60)
Glucose, Bld: 105 mg/dL — ABNORMAL HIGH (ref 70–99)
Phosphorus: 8.7 mg/dL — ABNORMAL HIGH (ref 2.5–4.6)
Potassium: 5.2 mmol/L — ABNORMAL HIGH (ref 3.5–5.1)
Sodium: 137 mmol/L (ref 135–145)

## 2018-10-18 LAB — CBC
HCT: 26.2 % — ABNORMAL LOW (ref 39.0–52.0)
Hemoglobin: 8.7 g/dL — ABNORMAL LOW (ref 13.0–17.0)
MCH: 29.5 pg (ref 26.0–34.0)
MCHC: 33.2 g/dL (ref 30.0–36.0)
MCV: 88.8 fL (ref 80.0–100.0)
Platelets: 144 10*3/uL — ABNORMAL LOW (ref 150–400)
RBC: 2.95 MIL/uL — ABNORMAL LOW (ref 4.22–5.81)
RDW: 20.2 % — ABNORMAL HIGH (ref 11.5–15.5)
WBC: 6.8 10*3/uL (ref 4.0–10.5)
nRBC: 0 % (ref 0.0–0.2)

## 2018-10-18 LAB — GLUCOSE, CAPILLARY
Glucose-Capillary: 95 mg/dL (ref 70–99)
Glucose-Capillary: 119 mg/dL — ABNORMAL HIGH (ref 70–99)
Glucose-Capillary: 90 mg/dL (ref 70–99)
Glucose-Capillary: 89 mg/dL (ref 70–99)

## 2018-10-18 MED ORDER — SODIUM CHLORIDE 0.9 % IV SOLN
1000.0000 mg | Freq: Once | INTRAVENOUS | Status: DC
  Filled 2018-10-18: qty 100

## 2018-10-18 MED ORDER — SODIUM CHLORIDE 0.9 % IV SOLN
1000.0000 mg | Freq: Once | INTRAVENOUS | Status: AC
  Administered 2018-10-19: 1000 mg via INTRAVENOUS
  Filled 2018-10-18: qty 100

## 2018-10-18 MED ORDER — HEPARIN SODIUM (PORCINE) 1000 UNIT/ML IJ SOLN
INTRAMUSCULAR | Status: AC
  Filled 2018-10-18: qty 4

## 2018-10-18 MED ORDER — HEPARIN SODIUM (PORCINE) 1000 UNIT/ML IJ SOLN
3800.0000 [IU] | Freq: Once | INTRAMUSCULAR | Status: AC
  Administered 2018-10-18: 3800 [IU]

## 2018-10-18 NOTE — Progress Notes (Signed)
Received consult to hang IV Rituxan. After consulting with Margreta Journey, IV team AD, IV team will hang at 11am on 10/19/18. Pharmacy and primary RN made aware.

## 2018-10-18 NOTE — Progress Notes (Signed)
Patient ID: [name]., male   DOB: Feb 15, 1946, 73 y.o.   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  Note Cr 1 in 06/2018 per Epic.  Rose to 4.67 in 08/2018 related to his current AKI - He is not yet cleared for discharge from a renal standpoint.  We are monitoring dialysis needs and urine output daily and actively changing therapy for his GN.  He has not yet been accepted to an outpatient dialysis unit - On HD since 4/10.  Tunneled HD catheter placed on 4/20 - HD today.  Assess dialysis needs daily - Strict ins/outs   - Hopeful for renal recovery which may be delayed  - Note that he lives closest to the [place] [facility] and may be placed there with AKI. CLIP process initiated   # Pauci-immune GN - Continue steroids  - Will transition from cytoxan based regimen to rituximab given his history of bladder cancer  - Plan for rituximab 1 gram once now and in 14 days.  Our office will arrange outpatient administration for two weeks from now and will proceed with rituximab once now   # HTN - controlled   # Hyperphosphatemia: Secondary to acute kidney injury; on hemodialysis/renal diet. - on phoslo  #  Hx bladder cancer  - Urology consulted.  Spoke with primary team.   - out of caution will change to rituximab   # Anemia: Status post intravenous iron, continue ESA especially in the setting of ongoing cyclophosphamide use.  No acute indication for PRBC's   Subjective:   He underwent tunneled dialysis catheter placement yesterday.  No urine output is charted - he has 300 mL dark urine in a urinal in the restroom which is from today.  Spoke with  patient and primary team regarding stopping cytoxan and transitioning to rituximab out of caution given his history of bladder cancer.  He is frustrated regarding delay in being discharged but understands that he is not yet cleared for discharge.   Review of systems:  Denies shortness of breath or chest pain Denies nausea or vomiting  Denies chest pain     Objective:   BP (!) 144/81 (BP Location: Left Arm)   Pulse 75   Temp (!) 97.3 F (36.3 C) (Oral)   Resp 18   Ht 5\' 10"  (1.778 m)   Wt 84.3 kg   SpO2 99%   BMI 26.67 kg/m   Intake/Output Summary (Last 24 hours) at 10/18/2018 1409 Last data filed at 10/18/2018 0900 Gross per 24 hour  Intake 220 ml  Output 240 ml  Net -20 ml   Weight change:   Physical Exam:  Gen: elderly male in bed in no acute distress  CVS: Pulse regular rhythm, normal rate Resp: Clear to auscultation bilaterally, no rales/rhonchi.   Access: Right IJ temporary dialysis catheter Abd: Soft, flat, nontender Ext: edema bilateral lower extremities   Imaging: Dg Chest 1 View  Result Date: 10/17/2018 CLINICAL DATA:  73 year old male with a history acute kidney injury EXAM: CHEST  1 VIEW COMPARISON:  No chest x-ray for comparison. Prior CT chest 03/02/2018 FINDINGS: Cardiomediastinal silhouette within normal limits. Fullness in  the central vasculature. Interlobular septal thickening. Opacity at the medial aspect of the right upper lobe, appears relatively similar to the scout image from the CT dated 03/02/2018 and the scout image from thoracic spine CT 05/05/2018, likely accentuated by slight right rotation and low lung volumes. No confluent airspace disease.  No pneumothorax or pleural effusion. Right IJ tunneled hemodialysis catheter appears to terminate superior vena cava. IMPRESSION: Reticular opacities may reflect developing fibrosis, with early changes on the prior chest CT of 03/02/2018, however, mild edema could have this appearance. Right IJ tunneled  hemodialysis catheter with the tip appearing to terminate superior vena cava. Electronically Signed   By: Corrie Mckusick D.O.   On: 10/17/2018 16:36   Ir Fluoro Guide Cv Line Right  Result Date: 10/17/2018 CLINICAL DATA:  End-stage renal disease and need for tunneled hemodialysis catheter. EXAM: TUNNELED CENTRAL VENOUS HEMODIALYSIS CATHETER PLACEMENT WITH ULTRASOUND AND FLUOROSCOPIC GUIDANCE ANESTHESIA/SEDATION: 1.0 mg IV Versed; 25 mcg IV Fentanyl. Total Moderate Sedation Time:   15 minutes. The patient's level of consciousness and physiologic status were continuously monitored during the procedure by Radiology nursing. MEDICATIONS: 2 g IV Ancef. FLUOROSCOPY TIME:  54 seconds.  12.9 mGy. PROCEDURE: The procedure, risks, benefits, and alternatives were explained to the patient. Questions regarding the procedure were encouraged and answered. The patient understands and consents to the procedure. A timeout was performed prior to initiating the procedure. The right neck and chest were prepped with chlorhexidine in a sterile fashion, and a sterile drape was applied covering the operative field. Maximum barrier sterile technique with sterile gowns and gloves were used for the procedure. Local anesthesia was provided with 1% lidocaine. Ultrasound was used to confirm patency of the right internal jugular vein. After creating a small venotomy incision, a 21 gauge needle was advanced into the right internal jugular vein under direct, real-time ultrasound guidance. Ultrasound image documentation was performed. After securing guidewire access, an 8 Fr dilator was placed. A J-wire was kinked to measure appropriate catheter length. A Palindrome tunneled hemodialysis catheter measuring 23 cm from tip to cuff was chosen for placement. This was tunneled in a retrograde fashion from the chest wall to the venotomy incision. At the venotomy, serial dilatation was performed and a 16 Fr peel-away sheath was placed over a guidewire.  The catheter was then placed through the sheath and the sheath removed. Final catheter positioning was confirmed and documented with a fluoroscopic spot image. The catheter was aspirated, flushed with saline, and injected with appropriate volume heparin dwells. The venotomy incision was closed with subcutaneous 3-0 Monocryl and subcuticular 4-0 Vicryl. Dermabond was applied to the incision. The catheter exit site was secured with 0-Prolene retention sutures. COMPLICATIONS: None.  No pneumothorax. FINDINGS: After catheter placement, the tip lies in the right atrium. The catheter aspirates normally and is ready for immediate use. IMPRESSION: Placement of tunneled hemodialysis catheter via the right internal jugular vein. The catheter tip lies in the right atrium. The catheter is ready for immediate use. Electronically Signed   By: Aletta Edouard M.D.   On: 10/17/2018 16:41   Ir US Guide Vasc Access Right  Result Date: 10/17/2018 CLINICAL DATA:  End-stage renal disease and need for tunneled hemodialysis catheter. EXAM: TUNNELED CENTRAL VENOUS HEMODIALYSIS CATHETER PLACEMENT WITH ULTRASOUND AND FLUOROSCOPIC GUIDANCE ANESTHESIA/SEDATION: 1.0 mg IV Versed; 25 mcg IV Fentanyl. Total Moderate Sedation Time:   15 minutes. The patient's level of consciousness and physiologic status were continuously monitored during the procedure by Radiology nursing. MEDICATIONS: 2  g IV Ancef. FLUOROSCOPY TIME:  54 seconds.  12.9 mGy. PROCEDURE: The procedure, risks, benefits, and alternatives were explained to the patient. Questions regarding the procedure were encouraged and answered. The patient understands and consents to the procedure. A timeout was performed prior to initiating the procedure. The right neck and chest were prepped with chlorhexidine in a sterile fashion, and a sterile drape was applied covering the operative field. Maximum barrier sterile technique with sterile gowns and gloves were used for the procedure. Local  anesthesia was provided with 1% lidocaine. Ultrasound was used to confirm patency of the right internal jugular vein. After creating a small venotomy incision, a 21 gauge needle was advanced into the right internal jugular vein under direct, real-time ultrasound guidance. Ultrasound image documentation was performed. After securing guidewire access, an 8 Fr dilator was placed. A J-wire was kinked to measure appropriate catheter length. A Palindrome tunneled hemodialysis catheter measuring 23 cm from tip to cuff was chosen for placement. This was tunneled in a retrograde fashion from the chest wall to the venotomy incision. At the venotomy, serial dilatation was performed and a 16 Fr peel-away sheath was placed over a guidewire. The catheter was then placed through the sheath and the sheath removed. Final catheter positioning was confirmed and documented with a fluoroscopic spot image. The catheter was aspirated, flushed with saline, and injected with appropriate volume heparin dwells. The venotomy incision was closed with subcutaneous 3-0 Monocryl and subcuticular 4-0 Vicryl. Dermabond was applied to the incision. The catheter exit site was secured with 0-Prolene retention sutures. COMPLICATIONS: None.  No pneumothorax. FINDINGS: After catheter placement, the tip lies in the right atrium. The catheter aspirates normally and is ready for immediate use. IMPRESSION: Placement of tunneled hemodialysis catheter via the right internal jugular vein. The catheter tip lies in the right atrium. The catheter is ready for immediate use. Electronically Signed   By: Aletta Edouard M.D.   On: 10/17/2018 16:41    Labs: BMET Recent Labs  Lab 10/12/18 0417 10/13/18 0330 10/14/18 0404 10/15/18 1041 10/16/18 0406 10/17/18 0411 10/18/18 0438  NA 133* 133* 132* 133* 135 133* 137  K 4.0 3.9 4.2 4.1 3.8 4.1 5.2*  CL 99 97* 95* 97* 98 99 99  CO2 28 26 31 26 28 27 24   GLUCOSE 98 177* 146* 161* 90 102* 105*  BUN 31* 18 41*  34* 31* 68* 96*  CREATININE 6.16* 4.18* 5.60* 4.11* 3.08* 4.70* 6.31*  CALCIUM 7.4* 7.4* 7.9* 7.4* 7.3* 7.4* 8.0*  PHOS 4.2 5.5* 6.5* 5.6* 4.9* 6.0* 8.7*   CBC Recent Labs  Lab 10/13/18 0330 10/13/18 1722 10/14/18 0404 10/15/18 1041  WBC 2.4* 4.2 7.2 6.3  NEUTROABS  --   --   --  5.3  HGB 8.3* 8.5* 8.5* 8.5*  HCT 25.6* 26.3* 26.0* 25.8*  MCV 87.1 86.5 86.1 86.9  PLT 91* 114* 123* 152    Medications:    . atorvastatin  80 mg Oral QHS  . calcium acetate  667 mg Oral TID WC  . Chlorhexidine Gluconate Cloth  6 each Topical Q0600  . Chlorhexidine Gluconate Cloth  6 each Topical Q0600  . cyclophosphamide  150 mg Oral Daily  . darbepoetin (ARANESP) injection - NON-DIALYSIS  100 mcg Subcutaneous Q Sun-1800  . doxazosin  4 mg Oral Daily  . ezetimibe  10 mg Oral Daily  . feeding supplement (PRO-STAT SUGAR FREE 64)  30 mL Oral TID  . fenofibrate  160 mg Oral Daily  .  insulin aspart  0-5 Units Subcutaneous QHS  . insulin aspart  0-9 Units Subcutaneous TID WC  . loratadine  10 mg Oral QHS  . metoprolol succinate  50 mg Oral Daily  . multivitamin  1 tablet Oral QHS  . nicotine  21 mg Transdermal QHS  . predniSONE  60 mg Oral Q breakfast    Claudia Desanctis 10/18/2018

## 2018-10-18 NOTE — Progress Notes (Signed)
HD cath site assessment: Bloody drainage noted on gauze under transparent dressing. Catheter placed yesterday. Will monitor for now, so as not to disturb clotting.  Wyvonnia Lora, RN made aware.

## 2018-10-18 NOTE — Care Management Important Message (Signed)
Important Message  Patient Details  Name: [name]. MRN: [id number] Date of Birth: 12/08/1945   Medicare Important Message Given:  Yes    Montavis Schubring 10/18/2018, 2:34 PM

## 2018-10-18 NOTE — TOC Progression Note (Signed)
Transition of Care (TOC) - Progression Note    Patient Details  Name: Kyden Potash. MRN: [id number] Date of Birth: 1946/03/05  [facility] of [facility]) CM/SW Contact  Bartholomew Crews, RN Phone Number: [phone] 10/18/2018, 11:49 AM  Clinical Narrative:    Spoke with patient's daughter, [name], by telephone this morning. Discussed pending approval for dialysis chair at [facility].   Discussed chemotherapy drug - cyclophosphamide (cytoxan) that patient is taking and will be taking for 3-6 months per nephrology. Advised that the outpatient pharmacies frequently need time to order medication, and that this medication can be expensive, however, patient has medicaid which will hopefully decrease his out of pocket costs.   [name] will be providing transportation for patient to and from hemodialysis. Patient lives alone, however, she is only a mile away. Discussed that patient may qualify for services through [employer] [facility] [place].   CM provided contact information. Will continue to follow for transition of care needs.   Expected Discharge Plan: Home/Self Care Barriers to Discharge: Continued Medical Work up  Expected Discharge Plan and Services Expected Discharge Plan: Home/Self Care   Discharge Planning Services: CM Consult                               Social Determinants of Health (SDOH) Interventions    Readmission Risk Interventions No flowsheet data found.

## 2018-10-18 NOTE — Progress Notes (Signed)
Dialysis Coordinator contacted clinic to check on status of OP HD seat and was informed that we are awaiting MD clearance. Dialysis Coordinator received call from patient's RN requesting that Dialysis Coordinator contact patient's daughter per daughter's request. Dialysis Coordinator called patient's daughter [name] at [phone]. Patient's daughter is very pleasant and appreciative of information as she will be assisting her father in his recovery and beyond. She states she will be transporting him to dialysis, although he can drive when he is well enough. Dialysis Coordinator explained that we are awaiting MD review of patient's medical information before an OP HD seat can be secured and that Dialysis Coordinator is hopeful that we may hear of decision today. Dialysis Coordinator also explained that patient cannot be discharged without OP HD seat secured, but that Dialysis Coordinator is not aware of whether or not patient is ready for discharge otherwise. Patient's daughter stated understanding and reports questions about patient's medications. Dialysis Coordinator informed patient's daughter that there is a Case Manager working with patient to ensure that he has necessary medications at discharge and offered to call CM to have her call daughter. Daughter very Patent attorney. Dialysis Coordinator will also call daughter back once OP HD seat is confirmed. Dialysis Coordinator contacted Ellard Artis G./CM to request that she call patient's daughter.  Dialysis Coordinator will also follow up with Nephrologist, RN, and patient once OP HD seat is confirmed and patient has schedule.

## 2018-10-18 NOTE — Progress Notes (Signed)
MD Tresa Moore came and took the urology cart. He states that he will return it.

## 2018-10-18 NOTE — Progress Notes (Signed)
Nutrition Follow-up  DOCUMENTATION CODES:   Not applicable  INTERVENTION:    Continue 30 ml Prostat TID, each supplement provides 100 kcals and 15 grams protein.   Continue Snacks TID between meals  Continue Renal MVI  Monitor Phosphorus- recommend binders with meals and snacks.   NUTRITION DIAGNOSIS:   Inadequate oral intake related to poor appetite, early satiety as evidenced by per patient/family report, estimated needs.  Progressing  GOAL:   Patient will meet greater than or equal to 90% of their needs  Progressing  MONITOR:   PO intake, Supplement acceptance, Weight trends, Labs, I & O's  REASON FOR ASSESSMENT:   Malnutrition Screening Tool    ASSESSMENT:   73 yo male, admitted with acute renal failure. PMH significant for h/o bladder cancer, COPD, CAD, CM, h/o colonic polyps, hyperlipidemia, HTN, PAD.   4/10 HD Initiated 4/13 Renal Biopsy  4/16 Prelim report: Pauci immune golmerulonephtiris started on immunosuppressive therapy 4/20- tunneled HD cath placed  RD working remotely.  Last HD 4/18 (net UF 2.5 L). Plan for next treatment today.   Talked with pt via phone. Pt reports appetite is slowly increasing. Meal completions charted as 25-100% for his last three meals. Pt reports eating grits and peaches for breakfast this am without complication. States if meat is sent on his tray it makes him nauseous and he will not eat his meal. He is requesting to have an omelet versus plain eggs. Will attempt to speak with kitchen.   Pt reports eating all snacks that are sent and drinking Prostat TID even though "it tastes awful." Encouraged pt to continue with high protein food and supplement options to meet increased needs with HD.   Pt is very hard of hearing and will likely need outpatient diet education as he plans to continue HD once discharged. This RD to provide handout in discharge instructions.   Current wt 84.3 kg (post HD on 4/18); admission wt 88.9 kg.  Recommend checking daily weights.   UOP: 240 ml x 24 hrs  Medications: Phoslo, SS novolog, rena-vit, prednisone Labs: K 5.2 (acceptable HD) corrected calcium 9.6 (wdl) Phosphorus 8.0 (H)- trending up   Diet Order:   Diet Order            DIET DYS 3 Room service appropriate? Yes; Fluid consistency: Thin  Diet effective now              EDUCATION NEEDS:   Education needs have been addressed  Skin:  Skin Assessment: Skin Integrity Issues: Skin Integrity Issues:: Other (Comment) Other: Ecchymosis - BL arms  Last BM:  4/18  Height:   Ht Readings from Last 1 Encounters:  09/27/18 5\' 10"  (1.778 m)    Weight:   Wt Readings from Last 1 Encounters:  10/15/18 84.3 kg    Ideal Body Weight:  75.5 kg  BMI:  Body mass index is 26.67 kg/m.  Estimated Nutritional Needs:   Kcal:  1900-2280 (25-30 kcal/kg IBW)  Protein:  91-114 gm (1.2-1.5 g/kg IBW)  Fluid:  >/= 1.5 L daily or per MD   Mariana Single RD, LDN Clinical Nutrition Pager # [phone]

## 2018-10-18 NOTE — Progress Notes (Addendum)
Patient Demographics:    [name], is a 73 y.o. male, DOB - 1945-11-20, VOZ:[id number]  Admit date - 09/27/2018   Admitting Physician Merton Border, MD  Outpatient Primary MD for the patient is Pickard, Cammie Mcgee, MD  LOS - 21  Chief Complaint  Patient presents with   Abdominal Pain        Subjective:    [name] today has no fevers, no emesis,  No chest pain, complains of fatigue, eating and drinking well, eager to go home  Assessment  & Plan :    Active Problems:   Acute renal failure (ARF) ([name])   History of bladder cancer   Type 2 diabetes mellitus without complication ([name])   CAD (coronary artery disease)   Uremia   AKI (acute kidney injury) [address])  Brief Summary 73 year old male with past medical history of diabetes mellitus, hypertension and COPD plus history of bladder cancer admitted on 3/31 after coming in with worsening renal failure after being sent over from his primary care doctor's office.  Patient at that time related bouts of diarrhea for the past 3 weeks and also found to have a urinalysis with 3+ proteinuria and hematuria.  Nephrology consulted and patient went renal biopsy on 10/10/18.  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  Nephrology started Solu-Medrol as well as cyclophosphamide, pt needs urology consult for possible cystoscopy prior to further cytoxan administration due to history of recurrent bladder cancer.  Started on dialysis on 10/07/18-- .... Nephrologist has decided to use Rituxan  rather than Cytoxan due to concerns about history of recurrent bladder cancers   A/p 1)Acute kidney Injury   - Renal biopsy from 10/10/18 showed pauci-immune glomerulonephritis with minimal scarring for which he was initiated on glucocorticoids (prednisone which needs to be tapered to off over the next 8 to 12  weeks) and oral cyclophosphamide for 3 to 6 months based on response with with eventual transition to azathioprine.  Note Cr 1 in 06/2018 per Epic.  Rose to 4.67 in 08/2018 related to his current AKI, He needs urology consult for possible cystoscopy prior to further cytoxan administration due to history of recurrent bladder cancer--- Nephrologist has decided to use Rituxan  rather than Cytoxan due to concerns about history of recurrent bladder cancers - He is not yet cleared for discharge from a renal standpoint.   On HD since 4/10.  Tunneled HD catheter placed on 10/17/18, Last HD 10/18/18... Patient does not have outpatient hemodialysis site yet- Clipping process initiated-   2) Pauci-immune GN- - immunosuppression as above in # 1  3) Hx recurrent bladder cancer - - needs urology consult for possible cystoscopy prior to further cytoxan administration  - I called and discussed this case with Dr. Tresa Moore from [facility] on 10/18/18, official urology consult pending  4)DM2-A1c was 5.3 in December 2019, patient is at risk for hypoglycemia, Use Novolog/Humalog Sliding scale insulin with Accu-Cheks/Fingersticks as ordered   5) acute hypoxic respiratory failure secondary to volume overload/renal failure----resolved with hemodialysis  6)Anemia: Status post intravenous iron, continue ESA especially in the setting of ongoing cyclophosphamide use.  No acute indication for transfusion at this time  7)CAD/HTN/HLD--stable, c/n metoprolol XL 50 mg daily continue Cardura 4  mg daily, IV hydralazine as needed elevated BP, continue Zetia, continue Lipitor  8)Tobacco abuse--continue nicotine patch, smoking cessation advised   Disposition/Need for in-Hospital Stay- patient unable to be discharged at this time due to awaiting outpatient hemodialysis spot/chair-- CLIP process pending  Code Status : Full   Family Communication:   na  Consultants:  Nephrology  Interventional radiology Urology---Dr  Tresa Moore  Procedures:  Renal biopsy done 4/13  Right IJ catheter placement  Plan conversion of right IJ catheter to [facility] on 10/17/18  Disposition Plan  : Home once CLIP process completed and if okay with nephrologist  DVT Prophylaxis  :    Heparin -    Lab Results  Component Value Date   PLT 152 10/15/2018    Inpatient Medications  Scheduled Meds:  atorvastatin  80 mg Oral QHS   calcium acetate  667 mg Oral TID WC   Chlorhexidine Gluconate Cloth  6 each Topical Q0600   Chlorhexidine Gluconate Cloth  6 each Topical Q0600   darbepoetin (ARANESP) injection - NON-DIALYSIS  100 mcg Subcutaneous Q Sun-1800   doxazosin  4 mg Oral Daily   ezetimibe  10 mg Oral Daily   feeding supplement (PRO-STAT SUGAR FREE 64)  30 mL Oral TID   fenofibrate  160 mg Oral Daily   insulin aspart  0-5 Units Subcutaneous QHS   insulin aspart  0-9 Units Subcutaneous TID WC   loratadine  10 mg Oral QHS   metoprolol succinate  50 mg Oral Daily   multivitamin  1 tablet Oral QHS   nicotine  21 mg Transdermal QHS   predniSONE  60 mg Oral Q breakfast   Continuous Infusions:  sodium chloride     sodium chloride     PRN Meds:.sodium chloride, sodium chloride, alteplase, EPINEPHrine, hydrALAZINE, lidocaine (PF), lidocaine-prilocaine, ondansetron **OR** ondansetron (ZOFRAN) IV, oxyCODONE-acetaminophen **AND** oxyCODONE, pentafluoroprop-tetrafluoroeth, sodium chloride flush, zolpidem   Anti-infectives (From admission, onward)   Start     Dose/Rate Route Frequency Ordered Stop   10/17/18 1500  ceFAZolin (ANCEF) IVPB 2g/100 mL premix     2 g 200 mL/hr over 30 Minutes Intravenous  Once 10/17/18 1452 10/17/18 1533        Objective:   Vitals:   10/17/18 1631 10/17/18 2115 10/18/18 0503 10/18/18 0859  BP: 137/84 (!) 144/84 135/84 (!) 144/81  Pulse: 70 82 85 75  Resp: 20 17 19 18   Temp: (!) 97.3 F (36.3 C) 98.2 F (36.8 C) 98.5 F (36.9 C) (!) 97.3 F (36.3 C)  TempSrc: Oral   Oral   SpO2: 99% 97% 99% 99%  Weight:      Height:        Wt Readings from Last 3 Encounters:  10/15/18 84.3 kg  09/26/18 88.9 kg  09/23/18 87.1 kg     Intake/Output Summary (Last 24 hours) at 10/18/2018 1506 Last data filed at 10/18/2018 0900 Gross per 24 hour  Intake 220 ml  Output 240 ml  Net -20 ml    Physical Exam Patient is examined daily including today on 10/18/18 , exams remain the same as of yesterday except that has changed   Gen:- Awake Alert,  In no apparent distress  HEENT:- [place].AT, No sclera icterus Eyes--- left eye vision loss Neck-Supple Neck,No JVD,.  Right IJ hemodialysis catheter Lungs-  CTAB , fair symmetrical air movement CV- S1, S2 normal, regular  Abd-  +ve B.Sounds, Abd Soft, No tenderness,    Extremity/Skin:- No  edema, pedal pulses present  Psych-affect  is appropriate, oriented x3 Neuro-no new focal deficits, no tremors   Data Review:   Micro Results No results found for this or any previous visit (from the past 240 hour(s)).  Radiology Reports Dg Chest 1 View  Result Date: 10/17/2018 CLINICAL DATA:  73 year old male with a history acute kidney injury EXAM: CHEST  1 VIEW COMPARISON:  No chest x-ray for comparison. Prior CT chest 03/02/2018 FINDINGS: Cardiomediastinal silhouette within normal limits. Fullness in the central vasculature. Interlobular septal thickening. Opacity at the medial aspect of the right upper lobe, appears relatively similar to the scout image from the CT dated 03/02/2018 and the scout image from thoracic spine CT 05/05/2018, likely accentuated by slight right rotation and low lung volumes. No confluent airspace disease.  No pneumothorax or pleural effusion. Right IJ tunneled hemodialysis catheter appears to terminate superior vena cava. IMPRESSION: Reticular opacities may reflect developing fibrosis, with early changes on the prior chest CT of 03/02/2018, however, mild edema could have this appearance. Right IJ tunneled hemodialysis  catheter with the tip appearing to terminate superior vena cava. Electronically Signed   By: Corrie Mckusick D.O.   On: 10/17/2018 16:36   US Renal  Result Date: 10/06/2018 CLINICAL DATA:  Acute kidney injury. EXAM: RENAL / URINARY TRACT ULTRASOUND COMPLETE COMPARISON:  09/27/2018 FINDINGS: Right Kidney: Renal measurements: 11.0 x 6.6 x 6.5 cm = volume: 245 mL. Mildly increased parenchymal echogenicity. No mass or hydronephrosis visualized. Left Kidney: Renal measurements: 12.1 x 5.9 x 5.3 cm = volume: 199 mL. Borderline increased parenchymal echogenicity. Small upper pole and interpolar cysts measuring 8 mm and 11 mm, respectively. No hydronephrosis. Bladder: Appears normal for degree of bladder distention. IMPRESSION: 1. No hydronephrosis. 2. Mildly echogenic kidneys compatible with medical renal disease. Electronically Signed   By: Logan Bores M.D.   On: 10/06/2018 11:40   US Renal  Result Date: 09/27/2018 CLINICAL DATA:  Acute renal failure EXAM: RENAL / URINARY TRACT ULTRASOUND COMPLETE COMPARISON:  CT 05/06/2018 FINDINGS: Right Kidney: Renal measurements: 12.4 x 6.5 x 5.4 cm = volume: 229 mL . Echogenicity within normal limits. No mass or hydronephrosis visualized. Left Kidney: Renal measurements: 14.5 x 5.7 x 6.5 cm = volume: 279 mL. Small cysts in the left kidney, the largest 1.2 cm. Normal echotexture. No hydronephrosis. Bladder: Appears normal for degree of bladder distention. IMPRESSION: No acute findings.  No hydronephrosis. Electronically Signed   By: Rolm Baptise M.D.   On: 09/27/2018 11:16   Ir Fluoro Guide Cv Line Right  Result Date: 10/17/2018 CLINICAL DATA:  End-stage renal disease and need for tunneled hemodialysis catheter. EXAM: TUNNELED CENTRAL VENOUS HEMODIALYSIS CATHETER PLACEMENT WITH ULTRASOUND AND FLUOROSCOPIC GUIDANCE ANESTHESIA/SEDATION: 1.0 mg IV Versed; 25 mcg IV Fentanyl. Total Moderate Sedation Time:   15 minutes. The patient's level of consciousness and physiologic status  were continuously monitored during the procedure by Radiology nursing. MEDICATIONS: 2 g IV Ancef. FLUOROSCOPY TIME:  54 seconds.  12.9 mGy. PROCEDURE: The procedure, risks, benefits, and alternatives were explained to the patient. Questions regarding the procedure were encouraged and answered. The patient understands and consents to the procedure. A timeout was performed prior to initiating the procedure. The right neck and chest were prepped with chlorhexidine in a sterile fashion, and a sterile drape was applied covering the operative field. Maximum barrier sterile technique with sterile gowns and gloves were used for the procedure. Local anesthesia was provided with 1% lidocaine. Ultrasound was used to confirm patency of the right internal jugular vein. After  creating a small venotomy incision, a 21 gauge needle was advanced into the right internal jugular vein under direct, real-time ultrasound guidance. Ultrasound image documentation was performed. After securing guidewire access, an 8 Fr dilator was placed. A J-wire was kinked to measure appropriate catheter length. A Palindrome tunneled hemodialysis catheter measuring 23 cm from tip to cuff was chosen for placement. This was tunneled in a retrograde fashion from the chest wall to the venotomy incision. At the venotomy, serial dilatation was performed and a 16 Fr peel-away sheath was placed over a guidewire. The catheter was then placed through the sheath and the sheath removed. Final catheter positioning was confirmed and documented with a fluoroscopic spot image. The catheter was aspirated, flushed with saline, and injected with appropriate volume heparin dwells. The venotomy incision was closed with subcutaneous 3-0 Monocryl and subcuticular 4-0 Vicryl. Dermabond was applied to the incision. The catheter exit site was secured with 0-Prolene retention sutures. COMPLICATIONS: None.  No pneumothorax. FINDINGS: After catheter placement, the tip lies in the  right atrium. The catheter aspirates normally and is ready for immediate use. IMPRESSION: Placement of tunneled hemodialysis catheter via the right internal jugular vein. The catheter tip lies in the right atrium. The catheter is ready for immediate use. Electronically Signed   By: Aletta Edouard M.D.   On: 10/17/2018 16:41   Ir Fluoro Guide Cv Line Right  Result Date: 10/07/2018 CLINICAL DATA:  Renal failure, needs venous access for hemodialysis EXAM: EXAM RIGHT IJ CATHETER PLACEMENT UNDER ULTRASOUND AND FLUOROSCOPIC GUIDANCE TECHNIQUE: The procedure, risks (including but not limited to bleeding, infection, organ damage, pneumothorax), benefits, and alternatives were explained to the patient. Questions regarding the procedure were encouraged and answered. The patient understands and consents to the procedure. Patency of the right IJ vein was confirmed with ultrasound with image documentation. An appropriate skin site was determined. Skin site was marked. Region was prepped using maximum barrier technique including cap and mask, sterile gown, sterile gloves, large sterile sheet, and Chlorhexidine as cutaneous antisepsis. The region was infiltrated locally with 1% lidocaine. Under real-time ultrasound guidance, the right IJ vein was accessed with a 21 gauge needle; the needle tip within the vein was confirmed with ultrasound image documentation. The needle exchanged over a 018 guidewire for vascular dilator which allowed advancement of a 20 cm Mahurkar catheter. This was positioned with the tip at the cavoatrial junction. Spot chest radiograph shows good positioning and no pneumothorax. Catheter was flushed and sutured externally with 0-Prolene sutures. Patient tolerated the procedure well. FLUOROSCOPY TIME:  Less than 0.1 minute; 15 uGym2 DAP COMPLICATIONS: COMPLICATIONS none IMPRESSION: 1. Technically successful right IJ Mahurkar catheter placement. Electronically Signed   By: Lucrezia Europe M.D.   On: 10/07/2018  13:07   Ir US Guide Vasc Access Right  Result Date: 10/17/2018 CLINICAL DATA:  End-stage renal disease and need for tunneled hemodialysis catheter. EXAM: TUNNELED CENTRAL VENOUS HEMODIALYSIS CATHETER PLACEMENT WITH ULTRASOUND AND FLUOROSCOPIC GUIDANCE ANESTHESIA/SEDATION: 1.0 mg IV Versed; 25 mcg IV Fentanyl. Total Moderate Sedation Time:   15 minutes. The patient's level of consciousness and physiologic status were continuously monitored during the procedure by Radiology nursing. MEDICATIONS: 2 g IV Ancef. FLUOROSCOPY TIME:  54 seconds.  12.9 mGy. PROCEDURE: The procedure, risks, benefits, and alternatives were explained to the patient. Questions regarding the procedure were encouraged and answered. The patient understands and consents to the procedure. A timeout was performed prior to initiating the procedure. The right neck and chest were prepped with chlorhexidine  in a sterile fashion, and a sterile drape was applied covering the operative field. Maximum barrier sterile technique with sterile gowns and gloves were used for the procedure. Local anesthesia was provided with 1% lidocaine. Ultrasound was used to confirm patency of the right internal jugular vein. After creating a small venotomy incision, a 21 gauge needle was advanced into the right internal jugular vein under direct, real-time ultrasound guidance. Ultrasound image documentation was performed. After securing guidewire access, an 8 Fr dilator was placed. A J-wire was kinked to measure appropriate catheter length. A Palindrome tunneled hemodialysis catheter measuring 23 cm from tip to cuff was chosen for placement. This was tunneled in a retrograde fashion from the chest wall to the venotomy incision. At the venotomy, serial dilatation was performed and a 16 Fr peel-away sheath was placed over a guidewire. The catheter was then placed through the sheath and the sheath removed. Final catheter positioning was confirmed and documented with a  fluoroscopic spot image. The catheter was aspirated, flushed with saline, and injected with appropriate volume heparin dwells. The venotomy incision was closed with subcutaneous 3-0 Monocryl and subcuticular 4-0 Vicryl. Dermabond was applied to the incision. The catheter exit site was secured with 0-Prolene retention sutures. COMPLICATIONS: None.  No pneumothorax. FINDINGS: After catheter placement, the tip lies in the right atrium. The catheter aspirates normally and is ready for immediate use. IMPRESSION: Placement of tunneled hemodialysis catheter via the right internal jugular vein. The catheter tip lies in the right atrium. The catheter is ready for immediate use. Electronically Signed   By: Aletta Edouard M.D.   On: 10/17/2018 16:41   Ir US Guide Vasc Access Right  Result Date: 10/07/2018 CLINICAL DATA:  Renal failure, needs venous access for hemodialysis EXAM: EXAM RIGHT IJ CATHETER PLACEMENT UNDER ULTRASOUND AND FLUOROSCOPIC GUIDANCE TECHNIQUE: The procedure, risks (including but not limited to bleeding, infection, organ damage, pneumothorax), benefits, and alternatives were explained to the patient. Questions regarding the procedure were encouraged and answered. The patient understands and consents to the procedure. Patency of the right IJ vein was confirmed with ultrasound with image documentation. An appropriate skin site was determined. Skin site was marked. Region was prepped using maximum barrier technique including cap and mask, sterile gown, sterile gloves, large sterile sheet, and Chlorhexidine as cutaneous antisepsis. The region was infiltrated locally with 1% lidocaine. Under real-time ultrasound guidance, the right IJ vein was accessed with a 21 gauge needle; the needle tip within the vein was confirmed with ultrasound image documentation. The needle exchanged over a 018 guidewire for vascular dilator which allowed advancement of a 20 cm Mahurkar catheter. This was positioned with the tip at  the cavoatrial junction. Spot chest radiograph shows good positioning and no pneumothorax. Catheter was flushed and sutured externally with 0-Prolene sutures. Patient tolerated the procedure well. FLUOROSCOPY TIME:  Less than 0.1 minute; 15 uGym2 DAP COMPLICATIONS: COMPLICATIONS none IMPRESSION: 1. Technically successful right IJ Mahurkar catheter placement. Electronically Signed   By: Lucrezia Europe M.D.   On: 10/07/2018 13:07   US Biopsy (kidney)  Result Date: 10/10/2018 INDICATION: 85 year old with acute kidney injury. Request for random renal biopsy. EXAM: ULTRASOUND-GUIDED LEFT RENAL BIOPSY MEDICATIONS: None. ANESTHESIA/SEDATION: Moderate (conscious) sedation was employed during this procedure. A total of Versed 1.5 mg and Fentanyl 50 mcg was administered intravenously. Moderate Sedation Time: 17 minutes. The patient's level of consciousness and vital signs were monitored continuously by radiology nursing throughout the procedure under my direct supervision. FLUOROSCOPY TIME:  None COMPLICATIONS: None  immediate. PROCEDURE: Informed written consent was obtained from the patient after a thorough discussion of the procedural risks, benefits and alternatives. All questions were addressed. Maximal Sterile Barrier Technique was utilized including caps, mask, sterile gowns, sterile gloves, sterile drape, hand hygiene and skin antiseptic. A timeout was performed prior to the initiation of the procedure. Both kidneys were evaluated with ultrasound. The left kidney was selected for biopsy. Left flank was prepped with chlorhexidine and sterile field was created. Skin and soft tissues were anesthetized with 1% lidocaine. Using ultrasound guidance, 16 gauge core biopsy needle was directed into the left kidney lower pole. A total of 2 core biopsies were obtained. Specimens placed in saline. Bandage placed over the puncture site. FINDINGS: Negative for hydronephrosis. Core biopsies obtained from the left kidney lower pole.  Negative for bleeding or hematoma formation following the core biopsies. IMPRESSION: Ultrasound-guided core biopsies of the left kidney lower pole. Electronically Signed   By: Markus Daft M.D.   On: 10/10/2018 17:15     CBC Recent Labs  Lab 10/12/18 1625 10/13/18 0330 10/13/18 1722 10/14/18 0404 10/15/18 1041  WBC Not Measured 2.4* 4.2 7.2 6.3  HGB Not Measured 8.3* 8.5* 8.5* 8.5*  HCT Not Measured 25.6* 26.3* 26.0* 25.8*  PLT 95* 91* 114* 123* 152  MCV Not Measured 87.1 86.5 86.1 86.9  MCH Not Measured 28.2 28.0 28.1 28.6  MCHC Not Measured 32.4 32.3 32.7 32.9  RDW Not Measured 18.7* 18.7* 19.2* 19.9*  LYMPHSABS  --   --   --   --  0.5*  MONOABS  --   --   --   --  0.3  EOSABS  --   --   --   --  0.1  BASOSABS  --   --   --   --  0.0    Chemistries  Recent Labs  Lab 10/14/18 0404 10/15/18 1041 10/16/18 0406 10/17/18 0411 10/18/18 0438  NA 132* 133* 135 133* 137  K 4.2 4.1 3.8 4.1 5.2*  CL 95* 97* 98 99 99  CO2 31 26 28 27 24   GLUCOSE 146* 161* 90 102* 105*  BUN 41* 34* 31* 68* 96*  CREATININE 5.60* 4.11* 3.08* 4.70* 6.31*  CALCIUM 7.9* 7.4* 7.3* 7.4* 8.0*   ------------------------------------------------------------------------------------------------------------------ No results for input(s): CHOL, HDL, LDLCALC, TRIG, CHOLHDL, LDLDIRECT in the last 72 hours.  Lab Results  Component Value Date   HGBA1C 5.3 06/02/2018   ------------------------------------------------------------------------------------------------------------------ No results for input(s): TSH, T4TOTAL, T3FREE, THYROIDAB in the last 72 hours.  Invalid input(s): FREET3 ------------------------------------------------------------------------------------------------------------------ No results for input(s): VITAMINB12, FOLATE, FERRITIN, TIBC, IRON, RETICCTPCT in the last 72 hours.  Coagulation profile No results for input(s): INR, PROTIME in the last 168 hours.  No results for input(s):  DDIMER in the last 72 hours.  Cardiac Enzymes No results for input(s): CKMB, TROPONINI, MYOGLOBIN in the last 168 hours.  Invalid input(s): CK ------------------------------------------------------------------------------------------------------------------ No results found for: BNP   Roxan Hockey M.D on 10/18/2018 at 3:06 PM  Go to [email or web address] - for contact info  [facility] - Office  [phone]
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
## 2018-10-19 DIAGNOSIS — M1380 Other specified arthritis, unspecified site: Secondary | ICD-10-CM | POA: Insufficient documentation

## 2018-10-19 DIAGNOSIS — H9191 Unspecified hearing loss, right ear: Secondary | ICD-10-CM | POA: Insufficient documentation

## 2018-10-19 LAB — GLUCOSE, CAPILLARY
Glucose-Capillary: 203 mg/dL — ABNORMAL HIGH (ref 70–99)
Glucose-Capillary: 60 mg/dL — ABNORMAL LOW (ref 70–99)
Glucose-Capillary: 109 mg/dL — ABNORMAL HIGH (ref 70–99)
Glucose-Capillary: 165 mg/dL — ABNORMAL HIGH (ref 70–99)
Glucose-Capillary: 233 mg/dL — ABNORMAL HIGH (ref 70–99)

## 2018-10-19 LAB — RENAL FUNCTION PANEL
Albumin: 1.6 g/dL — ABNORMAL LOW (ref 3.5–5.0)
Anion gap: 15 (ref 5–15)
BUN: 64 mg/dL — ABNORMAL HIGH (ref 8–23)
CO2: 21 mmol/L — ABNORMAL LOW (ref 22–32)
Calcium: 7.3 mg/dL — ABNORMAL LOW (ref 8.9–10.3)
Chloride: 102 mmol/L (ref 98–111)
Creatinine, Ser: 4.73 mg/dL — ABNORMAL HIGH (ref 0.61–1.24)
GFR calc Af Amer: 13 mL/min — ABNORMAL LOW (ref >60)
GFR calc non Af Amer: 11 mL/min — ABNORMAL LOW (ref >60)
Glucose, Bld: 78 mg/dL (ref 70–99)
Phosphorus: 5.3 mg/dL — ABNORMAL HIGH (ref 2.5–4.6)
Potassium: 4.6 mmol/L (ref 3.5–5.1)
Sodium: 138 mmol/L (ref 135–145)

## 2018-10-19 MED ORDER — SULFAMETHOXAZOLE-TRIMETHOPRIM 400-80 MG PO TABS
1.0000 | ORAL_TABLET | ORAL | Status: DC
  Administered 2018-10-19: 1 via ORAL
  Filled 2018-10-19: qty 1

## 2018-10-19 MED ORDER — CHLORHEXIDINE GLUCONATE CLOTH 2 % EX PADS
6.0000 | MEDICATED_PAD | Freq: Every day | CUTANEOUS | Status: DC
  Administered 2018-10-19: 6 via TOPICAL

## 2018-10-19 MED ORDER — CLOPIDOGREL BISULFATE 75 MG PO TABS
75.0000 mg | ORAL_TABLET | Freq: Every day | ORAL | Status: DC
  Administered 2018-10-20: 75 mg via ORAL
  Filled 2018-10-19: qty 1

## 2018-10-19 MED ORDER — PANTOPRAZOLE SODIUM 40 MG PO TBEC
40.0000 mg | DELAYED_RELEASE_TABLET | Freq: Every day | ORAL | Status: DC
  Administered 2018-10-19 – 2018-10-20 (×2): 40 mg via ORAL
  Filled 2018-10-19 (×2): qty 1

## 2018-10-19 MED ORDER — METOPROLOL SUCCINATE ER 25 MG PO TB24
25.0000 mg | ORAL_TABLET | Freq: Every day | ORAL | Status: DC
  Administered 2018-10-20: 25 mg via ORAL
  Filled 2018-10-19: qty 1

## 2018-10-19 NOTE — Progress Notes (Signed)
Dialysis Coordinator received returned call from patient's daughter/Brenda (who states patient is actually her "step-father"). Plan explained to [place] and understanding stated. She is prepared to pick up patient at discharge (and notes that the plan is for him to be discharged tomorrow after inpt HD, barring any change in medical status) and states she knows how to contact his hospital unit. She will take him to the OP HD clinic on Friday at 10:00am to sign paperwork and to the OP HD clinic on Saturday for his treatment at 11:30am. She understands that his schedule is TTS/11:30am.   Alphonzo Cruise Dialysis Coordinator [phone]

## 2018-10-19 NOTE — Progress Notes (Addendum)
Patient ID: [name] Sloop., male   DOB: 1946/02/09, 73 y.o.   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  Note Cr 1 in 06/2018 per Epic - On HD since 4/10.  Tunneled HD catheter placed on 4/20 - He has been on HD on a TTS basis and is approved for outpatient dialysis as an AKI on TTS 2nd shift at [facility] for a start date of 4/25 with arrival at 11:00 am.  (He will need to sign papers on 4/24 at the dialysis unit.  He will need to get his dialysis here on 4/23.  Patient is aware   - Strict ins/outs   - Hopeful for renal recovery which may be delayed  - low k diet  # Pauci-immune GN - Continue steroids at prednisone 60 mg daily.  Add pantoprazole for GI ppx and would need to be discharged on PPI  - transitioned from cytoxan based regimen to rituximab given his history of bladder cancer  - Rituximab 1 gram once today (4/22) and in 14 days.  Our office will arrange outpatient administration for two weeks from now.  Spoke with patient regarding risks and benefits of rituximab and he agrees to proceed.  - Single strength bactrim three times a week for ppx added as well.    # HTN - controlled  - Would reduce metoprolol XL to 25 mg daily given bradycardia   # Hyperphosphatemia: Secondary to acute kidney injury; on hemodialysis/renal diet. - on phoslo and HD   #  Hx bladder cancer  - Urology consulted and cleared from their point of view for cytoxan if needed.  No recurrence by cysto - note that we have changed to rituximab   # Anemia: Status post intravenous iron, continue ESA especially in the setting of ongoing cyclophosphamide use.  No  acute indication for PRBC's - stable   Disposition:  Patient is cleared for discharge from a renal standpoint for tomorrow after dialysis here at [facility] on 4/23.  He voiced understanding.    Subjective:   Patient has had 600 mL UOP charted over 4/21. Nursing reports that he did not receive rituxan yesterday as he was in dialysis.  He feels ok today.  Had HD last night.  He is anxious to leave the hospital   Review of systems:  Denies shortness of breath or chest pain Denies nausea or vomiting  Denies chest pain     Objective:   BP (!) 147/86 (BP Location: Left Arm)   Pulse 100   Temp 97.8 F (36.6 C) (Oral)   Resp 20   Ht 5\' 10"  (1.778 m)   Wt 83.7 kg   SpO2 94%   BMI 26.48 kg/m   Intake/Output Summary (Last 24 hours) at 10/19/2018 1010 Last data filed at 10/19/2018 6468 Gross per 24 hour  Intake 840 ml  Output 2100 ml  Net -1260 ml   Weight change:   Physical Exam:  Gen: elderly male in bed in no acute distress CVS: Pulse regular rhythm, normal rate Resp: Clear to auscultation bilaterally, no rales/rhonchi.   Access: right chest tunneled dialysis catheter  Abd: Soft, flat, nontender  Ext: no edema bilateral lower extremities   Imaging: Dg Chest 1 View  Result Date: 10/17/2018 CLINICAL DATA:  73 year old male with a history acute kidney injury EXAM: CHEST  1 VIEW COMPARISON:  No chest x-ray for comparison. Prior CT chest 03/02/2018 FINDINGS: Cardiomediastinal silhouette within normal limits. Fullness in the central vasculature. Interlobular septal thickening. Opacity at the medial aspect of the right upper lobe, appears relatively similar to the scout image from the CT dated 03/02/2018 and the scout image from thoracic spine CT 05/05/2018, likely accentuated by slight right rotation and low lung volumes. No confluent airspace disease.  No pneumothorax or pleural effusion. Right IJ tunneled hemodialysis catheter appears to terminate superior vena cava. IMPRESSION: Reticular  opacities may reflect developing fibrosis, with early changes on the prior chest CT of 03/02/2018, however, mild edema could have this appearance. Right IJ tunneled hemodialysis catheter with the tip appearing to terminate superior vena cava. Electronically Signed   By: Corrie Mckusick D.O.   On: 10/17/2018 16:36   Ir Fluoro Guide Cv Line Right  Result Date: 10/17/2018 CLINICAL DATA:  End-stage renal disease and need for tunneled hemodialysis catheter. EXAM: TUNNELED CENTRAL VENOUS HEMODIALYSIS CATHETER PLACEMENT WITH ULTRASOUND AND FLUOROSCOPIC GUIDANCE ANESTHESIA/SEDATION: 1.0 mg IV Versed; 25 mcg IV Fentanyl. Total Moderate Sedation Time:   15 minutes. The patient's level of consciousness and physiologic status were continuously monitored during the procedure by Radiology nursing. MEDICATIONS: 2 g IV Ancef. FLUOROSCOPY TIME:  54 seconds.  12.9 mGy. PROCEDURE: The procedure, risks, benefits, and alternatives were explained to the patient. Questions regarding the procedure were encouraged and answered. The patient understands and consents to the procedure. A timeout was performed prior to initiating the procedure. The right neck and chest were prepped with chlorhexidine in a sterile fashion, and a sterile drape was applied covering the operative field. Maximum barrier sterile technique with sterile gowns and gloves were used for the procedure. Local anesthesia was provided with 1% lidocaine. Ultrasound was used to confirm patency of the right internal jugular vein. After creating a small venotomy incision, a 21 gauge needle was advanced into the right internal jugular vein under direct, real-time ultrasound guidance. Ultrasound image documentation was performed. After securing guidewire access, an 8 Fr dilator was placed. A J-wire was kinked to measure appropriate catheter length. A Palindrome tunneled hemodialysis catheter measuring 23 cm from tip to cuff was chosen for placement. This was tunneled in a  retrograde fashion from the chest wall to the venotomy incision. At the venotomy, serial dilatation was performed and a 16 Fr peel-away sheath was placed over a guidewire. The catheter was then placed through the sheath and the sheath removed. Final catheter positioning was confirmed and documented with a fluoroscopic spot image. The catheter was aspirated, flushed with saline, and injected with appropriate volume heparin dwells. The venotomy incision was closed with subcutaneous 3-0 Monocryl and subcuticular 4-0 Vicryl. Dermabond was applied to the incision. The catheter exit site was secured with 0-Prolene retention sutures. COMPLICATIONS: None.  No pneumothorax. FINDINGS: After catheter placement, the tip lies in the right atrium. The catheter aspirates normally and is ready for immediate use. IMPRESSION: Placement of tunneled hemodialysis catheter via the right internal jugular vein. The catheter tip lies in the right atrium. The catheter is ready for immediate use. Electronically Signed   By: Aletta Edouard M.D.   On: 10/17/2018 16:41   Ir US Guide Vasc Access Right  Result Date: 10/17/2018 CLINICAL DATA:  End-stage renal disease and need  for tunneled hemodialysis catheter. EXAM: TUNNELED CENTRAL VENOUS HEMODIALYSIS CATHETER PLACEMENT WITH ULTRASOUND AND FLUOROSCOPIC GUIDANCE ANESTHESIA/SEDATION: 1.0 mg IV Versed; 25 mcg IV Fentanyl. Total Moderate Sedation Time:   15 minutes. The patient's level of consciousness and physiologic status were continuously monitored during the procedure by Radiology nursing. MEDICATIONS: 2 g IV Ancef. FLUOROSCOPY TIME:  54 seconds.  12.9 mGy. PROCEDURE: The procedure, risks, benefits, and alternatives were explained to the patient. Questions regarding the procedure were encouraged and answered. The patient understands and consents to the procedure. A timeout was performed prior to initiating the procedure. The right neck and chest were prepped with chlorhexidine in a sterile  fashion, and a sterile drape was applied covering the operative field. Maximum barrier sterile technique with sterile gowns and gloves were used for the procedure. Local anesthesia was provided with 1% lidocaine. Ultrasound was used to confirm patency of the right internal jugular vein. After creating a small venotomy incision, a 21 gauge needle was advanced into the right internal jugular vein under direct, real-time ultrasound guidance. Ultrasound image documentation was performed. After securing guidewire access, an 8 Fr dilator was placed. A J-wire was kinked to measure appropriate catheter length. A Palindrome tunneled hemodialysis catheter measuring 23 cm from tip to cuff was chosen for placement. This was tunneled in a retrograde fashion from the chest wall to the venotomy incision. At the venotomy, serial dilatation was performed and a 16 Fr peel-away sheath was placed over a guidewire. The catheter was then placed through the sheath and the sheath removed. Final catheter positioning was confirmed and documented with a fluoroscopic spot image. The catheter was aspirated, flushed with saline, and injected with appropriate volume heparin dwells. The venotomy incision was closed with subcutaneous 3-0 Monocryl and subcuticular 4-0 Vicryl. Dermabond was applied to the incision. The catheter exit site was secured with 0-Prolene retention sutures. COMPLICATIONS: None.  No pneumothorax. FINDINGS: After catheter placement, the tip lies in the right atrium. The catheter aspirates normally and is ready for immediate use. IMPRESSION: Placement of tunneled hemodialysis catheter via the right internal jugular vein. The catheter tip lies in the right atrium. The catheter is ready for immediate use. Electronically Signed   By: Aletta Edouard M.D.   On: 10/17/2018 16:41    Labs: BMET Recent Labs  Lab 10/13/18 0330 10/14/18 0404 10/15/18 1041 10/16/18 0406 10/17/18 0411 10/18/18 0438 10/19/18 0513  NA 133* 132*  133* 135 133* 137 138  K 3.9 4.2 4.1 3.8 4.1 5.2* 4.6  CL 97* 95* 97* 98 99 99 102  CO2 26 31 26 28 27 24  21*  GLUCOSE 177* 146* 161* 90 102* 105* 78  BUN 18 41* 34* 31* 68* 96* 64*  CREATININE 4.18* 5.60* 4.11* 3.08* 4.70* 6.31* 4.73*  CALCIUM 7.4* 7.9* 7.4* 7.3* 7.4* 8.0* 7.3*  PHOS 5.5* 6.5* 5.6* 4.9* 6.0* 8.7* 5.3*   CBC Recent Labs  Lab 10/13/18 1722 10/14/18 0404 10/15/18 1041 10/18/18 1800  WBC 4.2 7.2 6.3 6.8  NEUTROABS  --   --  5.3  --   HGB 8.5* 8.5* 8.5* 8.7*  HCT 26.3* 26.0* 25.8* 26.2*  MCV 86.5 86.1 86.9 88.8  PLT 114* 123* 152 144*    Medications:    . atorvastatin  80 mg Oral QHS  . calcium acetate  667 mg Oral TID WC  . Chlorhexidine Gluconate Cloth  6 each Topical Q0600  . Chlorhexidine Gluconate Cloth  6 each Topical Q0600  . darbepoetin (ARANESP) injection - NON-DIALYSIS  100 mcg Subcutaneous Q Sun-1800  . doxazosin  4 mg Oral Daily  . ezetimibe  10 mg Oral Daily  . feeding supplement (PRO-STAT SUGAR FREE 64)  30 mL Oral TID  . fenofibrate  160 mg Oral Daily  . insulin aspart  0-5 Units Subcutaneous QHS  . insulin aspart  0-9 Units Subcutaneous TID WC  . loratadine  10 mg Oral QHS  . metoprolol succinate  50 mg Oral Daily  . multivitamin  1 tablet Oral QHS  . nicotine  21 mg Transdermal QHS  . predniSONE  60 mg Oral Q breakfast    Claudia Desanctis 10/19/2018

## 2018-10-19 NOTE — Progress Notes (Signed)
Patient has OP HD seat confirmed at [facility] on TTS schedule 11:30am. It is currently planned for him to received HD in the hospital tomorrow 10/20/18 prior to discharge because the OP HD clinic is not able to accommodate him as they already have a new admit starting tomorrow. Plan is for patient to start at the OP HD clinic on Saturday, 10/22/18 at 11:30am. He will need to report to the clinic on Friday to sign paperwork prior to his Saturday start. Charge RN/Laura requests that patient arrive at 10:00am on Friday to sign.  Dialysis Coordinator attempted to contact patient's daughter/Brenda at [phone] to update her with this information, but person who answered the phone stated that she was not available at this time and would give message for [name] to call Dialysis Coordinator back.  Dr. Foster/Nephrologist aware of plan and states she will call in orders to OP HD clinic.  [facility] [address], [place], [place] [address] [phone]

## 2018-10-19 NOTE — Progress Notes (Signed)
PROGRESS NOTE    [name].  NOB:[id number] DOB: 31-Dec-1945 DOA: 09/27/2018 PCP: [name], MD   Brief Narrative: As per HPI 73 year old male with past medical history of diabetes mellitus, hypertension and COPD plus history of bladder cancer admitted on 3/31 after coming in with worsening renal failure after being sent over from his primary care doctor's office. Patient at that time related bouts of diarrhea for the past 3 weeks and also found to have a urinalysis with 3+ proteinuria and hematuria. Nephrology consulted and patient went renal biopsy on 10/10/18. 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 Uro was consulted and s/p  Cystoscopy and cleared for Rituxan infusion due to history of recurrent bladder cancer. he has been started on dialysis on 10/07/18 w Rt IJ HD cath.  4/22: OP HD has been set up  with [facility] On TTS schedul kidney [facility]   Subjective: Resting comfortably on the bedside chair.  Patient reports having urine output. Appears weak frail deconditioned but confirms he is much improved  Assessment & Plan:   Acute renal failure: Status post renal biopsy 4/13 with payuci immune GN  and minimal scarring, follow-up started on systemic steroid to be tapered off over 8 to 12 weeks, oral cyclophosphamide for 3 to 6 months based on response with eventual transition to azathioprine seen by urology. noted plan for  rituximab IV today then in 24 day. Cont POC as per nephrology, on HD since 4/10, has a right IJ dialysis catheter and is set up for outpatient dialysis TTS, due for HD tomorrow.  Continue low K diet.  Acute hypoxic respiratory failure from fluid overload due to renal failure.   Improved with dialysis.EF showed just routine.For this I think he is followed: Oh on room air currently.  Pauci immune GN:See #1.Rituximab  as above, steroid and Bactrim SS prophylaxis 3 times a week as per nephrology.  History of recurrent bladder cancer, seen by urology and underwent bedside cystoscopy and no Recurrence.  Type 2 diabetes mellitus without complication, HUT6L 5.3 in 05/2018.  Stable mostly but  borderline hypoglycemic this morning.  Continue sliding scale prn, Encourage oral intake.  CAD/HTN/HLD: Stable with no chest pain.  Decrease metoprolol to 25 given bradycardia.  Cont Cardura, Zetia, Lipitor.  Was plavix at home-discontinued on 4/13 for renal biopsy. We will resume.   Anemia: hemoglobin stable, status post IV iron infusion, continue ESA per has a right IJ dialysis catheter  Tobacco abuse continue nicotine patch.   DVT prophylaxis: scd Code Status: full Family Communication: Discussed plan of care with the patient.   Disposition Plan: remains inpatient pending clinical improvement.  Plan for discharge tomorrow after dialysis if okay with nephrology.   Consultants:   Procedures:  Antimicrobials: Anti-infectives (From admission, onward)   Start     Dose/Rate Route Frequency Ordered Stop   10/19/18 1100  sulfamethoxazole-trimethoprim (BACTRIM) 400-80 MG per tablet 1 tablet     1 tablet Oral Once per day on Mon Wed Fri 10/19/18 1048     10/17/18 1500  ceFAZolin (ANCEF) IVPB 2g/100 mL premix     2 g 200 mL/hr over 30 Minutes Intravenous  Once 10/17/18 1452 10/17/18 1533       Objective: Vitals:   10/18/18 2111 10/18/18 2129 10/19/18 0523 10/19/18 1012  BP: 110/60 133/83 (!) 147/86 94/60  Pulse: (!) 55 (!) 47 100 78  Resp: 20 20 20 18   Temp:  97.8 F (36.6 C) 97.8 F (36.6 C) 97.8 F (36.6 C) 98.1 F (36.7 C)  TempSrc: Oral Oral Oral Oral  SpO2:  97% 94% 92%  Weight:  83.7 kg    Height:        Intake/Output Summary (Last 24 hours) at 10/19/2018 1212 Last data filed at 10/19/2018 1109 Gross per 24 hour  Intake 1080 ml  Output 2100 ml  Net -1020 ml   Filed Weights   10/18/18 1735  10/18/18 2041 10/18/18 2129  Weight: 85.5 kg 84 kg 83.7 kg   Weight change:   Body mass index is 26.48 kg/m.  Intake/Output from previous day: 04/21 0701 - 04/22 0700 In: 820 [P.O.:820] Out: 2100 [Urine:600] Intake/Output this shift: Total I/O In: 360 [P.O.:360] Out: -   Examination:  General exam: Appears calm and comfortable,Not in distress, older fore the age HEENT:PERRL,Oral mucosa moist, Ear/Nose normal on gross exam Respiratory system: Bilateral equal air entry, normal vesicular breath sounds, no wheezes or crackles  Cardiovascular system: S1 & S2 heard,No JVD, murmurs. Gastrointestinal system: Abdomen is  soft, non tender, non distended, BS +  Nervous System:Alert and oriented. No focal neurological deficits/moving extremities, sensation intact. Extremities: No edema, no clubbing, distal peripheral pulses palpable. Skin: No rashes, lesions, no icterus MSK: Normal muscle bulk,tone ,power Rt IJ hd CATH+ w mild erythema but no drainage. Medications:  Scheduled Meds:  atorvastatin  80 mg Oral QHS   calcium acetate  667 mg Oral TID WC   Chlorhexidine Gluconate Cloth  6 each Topical Q0600   darbepoetin (ARANESP) injection - NON-DIALYSIS  100 mcg Subcutaneous Q Sun-1800   doxazosin  4 mg Oral Daily   ezetimibe  10 mg Oral Daily   feeding supplement (PRO-STAT SUGAR FREE 64)  30 mL Oral TID   fenofibrate  160 mg Oral Daily   insulin aspart  0-5 Units Subcutaneous QHS   insulin aspart  0-9 Units Subcutaneous TID WC   loratadine  10 mg Oral QHS   [START ON 10/20/2018] metoprolol succinate  25 mg Oral Daily   multivitamin  1 tablet Oral QHS   nicotine  21 mg Transdermal QHS   pantoprazole  40 mg Oral Daily   predniSONE  60 mg Oral Q breakfast   sulfamethoxazole-trimethoprim  1 tablet Oral Once per day on Mon Wed Fri   Continuous Infusions:  sodium chloride     sodium chloride     riTUXimab (RITUXAN)  NON-ONCOLOGY  infusion      Data Reviewed: I  have personally reviewed following labs and imaging studies  CBC: Recent Labs  Lab 10/13/18 0330 10/13/18 1722 10/14/18 0404 10/15/18 1041 10/18/18 1800  WBC 2.4* 4.2 7.2 6.3 6.8  NEUTROABS  --   --   --  5.3  --   HGB 8.3* 8.5* 8.5* 8.5* 8.7*  HCT 25.6* 26.3* 26.0* 25.8* 26.2*  MCV 87.1 86.5 86.1 86.9 88.8  PLT 91* 114* 123* 152 952*   Basic Metabolic Panel: Recent Labs  Lab 10/15/18 1041 10/16/18 0406 10/17/18 0411 10/18/18 0438 10/19/18 0513  NA 133* 135 133* 137 138  K 4.1 3.8 4.1 5.2* 4.6  CL 97* 98 99 99 102  CO2 26 28 27 24  21*  GLUCOSE 161* 90 102* 105* 78  BUN 34* 31* 68* 96* 64*  CREATININE 4.11* 3.08* 4.70* 6.31* 4.73*  CALCIUM 7.4* 7.3* 7.4* 8.0* 7.3*  PHOS 5.6* 4.9* 6.0* 8.7* 5.3*   GFR: Estimated Creatinine Clearance: 14.1 mL/min (A) (by C-G  formula based on SCr of 4.73 mg/dL (H)). Liver Function Tests: Recent Labs  Lab 10/15/18 1041 10/16/18 0406 10/17/18 0411 10/18/18 0438 10/19/18 0513  ALBUMIN 1.8* 1.7* 1.7* 2.0* 1.6*   No results for input(s): LIPASE, AMYLASE in the last 168 hours. No results for input(s): AMMONIA in the last 168 hours. Coagulation Profile: No results for input(s): INR, PROTIME in the last 168 hours. Cardiac Enzymes: No results for input(s): CKTOTAL, CKMB, CKMBINDEX, TROPONINI in the last 168 hours. BNP (last 3 results) No results for input(s): PROBNP in the last 8760 hours. HbA1C: No results for input(s): HGBA1C in the last 72 hours. CBG: Recent Labs  Lab 10/18/18 1701 10/18/18 2153 10/19/18 0703 10/19/18 1122 10/19/18 1153  GLUCAP 90 89 203* 60* 109*   Lipid Profile: No results for input(s): CHOL, HDL, LDLCALC, TRIG, CHOLHDL, LDLDIRECT in the last 72 hours. Thyroid Function Tests: No results for input(s): TSH, T4TOTAL, FREET4, T3FREE, THYROIDAB in the last 72 hours. Anemia Panel: No results for input(s): VITAMINB12, FOLATE, FERRITIN, TIBC, IRON, RETICCTPCT in the last 72 hours. Sepsis Labs: No results for  input(s): PROCALCITON, LATICACIDVEN in the last 168 hours.  No results found for this or any previous visit (from the past 240 hour(s)).    Radiology Studies: Dg Chest 1 View  Result Date: 10/17/2018 CLINICAL DATA:  73 year old male with a history acute kidney injury EXAM: CHEST  1 VIEW COMPARISON:  No chest x-ray for comparison. Prior CT chest 03/02/2018 FINDINGS: Cardiomediastinal silhouette within normal limits. Fullness in the central vasculature. Interlobular septal thickening. Opacity at the medial aspect of the right upper lobe, appears relatively similar to the scout image from the CT dated 03/02/2018 and the scout image from thoracic spine CT 05/05/2018, likely accentuated by slight right rotation and low lung volumes. No confluent airspace disease.  No pneumothorax or pleural effusion. Right IJ tunneled hemodialysis catheter appears to terminate superior vena cava. IMPRESSION: Reticular opacities may reflect developing fibrosis, with early changes on the prior chest CT of 03/02/2018, however, mild edema could have this appearance. Right IJ tunneled hemodialysis catheter with the tip appearing to terminate superior vena cava. Electronically Signed   By: Corrie Mckusick D.O.   On: 10/17/2018 16:36   Ir Fluoro Guide Cv Line Right  Result Date: 10/17/2018 CLINICAL DATA:  End-stage renal disease and need for tunneled hemodialysis catheter. EXAM: TUNNELED CENTRAL VENOUS HEMODIALYSIS CATHETER PLACEMENT WITH ULTRASOUND AND FLUOROSCOPIC GUIDANCE ANESTHESIA/SEDATION: 1.0 mg IV Versed; 25 mcg IV Fentanyl. Total Moderate Sedation Time:   15 minutes. The patient's level of consciousness and physiologic status were continuously monitored during the procedure by Radiology nursing. MEDICATIONS: 2 g IV Ancef. FLUOROSCOPY TIME:  54 seconds.  12.9 mGy. PROCEDURE: The procedure, risks, benefits, and alternatives were explained to the patient. Questions regarding the procedure were encouraged and answered. The  patient understands and consents to the procedure. A timeout was performed prior to initiating the procedure. The right neck and chest were prepped with chlorhexidine in a sterile fashion, and a sterile drape was applied covering the operative field. Maximum barrier sterile technique with sterile gowns and gloves were used for the procedure. Local anesthesia was provided with 1% lidocaine. Ultrasound was used to confirm patency of the right internal jugular vein. After creating a small venotomy incision, a 21 gauge needle was advanced into the right internal jugular vein under direct, real-time ultrasound guidance. Ultrasound image documentation was performed. After securing guidewire access, an 8 Fr dilator was placed. A J-wire was kinked to  measure appropriate catheter length. A Palindrome tunneled hemodialysis catheter measuring 23 cm from tip to cuff was chosen for placement. This was tunneled in a retrograde fashion from the chest wall to the venotomy incision. At the venotomy, serial dilatation was performed and a 16 Fr peel-away sheath was placed over a guidewire. The catheter was then placed through the sheath and the sheath removed. Final catheter positioning was confirmed and documented with a fluoroscopic spot image. The catheter was aspirated, flushed with saline, and injected with appropriate volume heparin dwells. The venotomy incision was closed with subcutaneous 3-0 Monocryl and subcuticular 4-0 Vicryl. Dermabond was applied to the incision. The catheter exit site was secured with 0-Prolene retention sutures. COMPLICATIONS: None.  No pneumothorax. FINDINGS: After catheter placement, the tip lies in the right atrium. The catheter aspirates normally and is ready for immediate use. IMPRESSION: Placement of tunneled hemodialysis catheter via the right internal jugular vein. The catheter tip lies in the right atrium. The catheter is ready for immediate use. Electronically Signed   By: Aletta Edouard M.D.    On: 10/17/2018 16:41   Ir US Guide Vasc Access Right  Result Date: 10/17/2018 CLINICAL DATA:  End-stage renal disease and need for tunneled hemodialysis catheter. EXAM: TUNNELED CENTRAL VENOUS HEMODIALYSIS CATHETER PLACEMENT WITH ULTRASOUND AND FLUOROSCOPIC GUIDANCE ANESTHESIA/SEDATION: 1.0 mg IV Versed; 25 mcg IV Fentanyl. Total Moderate Sedation Time:   15 minutes. The patient's level of consciousness and physiologic status were continuously monitored during the procedure by Radiology nursing. MEDICATIONS: 2 g IV Ancef. FLUOROSCOPY TIME:  54 seconds.  12.9 mGy. PROCEDURE: The procedure, risks, benefits, and alternatives were explained to the patient. Questions regarding the procedure were encouraged and answered. The patient understands and consents to the procedure. A timeout was performed prior to initiating the procedure. The right neck and chest were prepped with chlorhexidine in a sterile fashion, and a sterile drape was applied covering the operative field. Maximum barrier sterile technique with sterile gowns and gloves were used for the procedure. Local anesthesia was provided with 1% lidocaine. Ultrasound was used to confirm patency of the right internal jugular vein. After creating a small venotomy incision, a 21 gauge needle was advanced into the right internal jugular vein under direct, real-time ultrasound guidance. Ultrasound image documentation was performed. After securing guidewire access, an 8 Fr dilator was placed. A J-wire was kinked to measure appropriate catheter length. A Palindrome tunneled hemodialysis catheter measuring 23 cm from tip to cuff was chosen for placement. This was tunneled in a retrograde fashion from the chest wall to the venotomy incision. At the venotomy, serial dilatation was performed and a 16 Fr peel-away sheath was placed over a guidewire. The catheter was then placed through the sheath and the sheath removed. Final catheter positioning was confirmed and  documented with a fluoroscopic spot image. The catheter was aspirated, flushed with saline, and injected with appropriate volume heparin dwells. The venotomy incision was closed with subcutaneous 3-0 Monocryl and subcuticular 4-0 Vicryl. Dermabond was applied to the incision. The catheter exit site was secured with 0-Prolene retention sutures. COMPLICATIONS: None.  No pneumothorax. FINDINGS: After catheter placement, the tip lies in the right atrium. The catheter aspirates normally and is ready for immediate use. IMPRESSION: Placement of tunneled hemodialysis catheter via the right internal jugular vein. The catheter tip lies in the right atrium. The catheter is ready for immediate use. Electronically Signed   By: Aletta Edouard M.D.   On: 10/17/2018 16:41  LOS: 22 days   Time spent: More than 50% of that time was spent in counseling and/or coordination of care.  Antonieta Pert, MD [facility]  10/19/2018, 12:12 PM

## 2018-10-19 NOTE — Consult Note (Signed)
   [facility] CM Inpatient Consult   10/19/2018  [name]. 10-21-1945 [id number]    Referral received from transition of care CM for community follow-up needs for patient. He is a [employer] patient.  Notified [employer] program coordinator regarding his potential post hospital needs for community follow-up.   For questions and additional information, please call:  Feven Alderfer A. Suzanne Kho, BSN, RN-BC [facility] Liaison Cell: [phone]

## 2018-10-20 DIAGNOSIS — E039 Hypothyroidism, unspecified: Secondary | ICD-10-CM | POA: Insufficient documentation

## 2018-10-20 DIAGNOSIS — L299 Pruritus, unspecified: Secondary | ICD-10-CM | POA: Insufficient documentation

## 2018-10-20 HISTORY — DX: Hypothyroidism, unspecified: E03.9

## 2018-10-20 LAB — CBC
HCT: 24.2 % — ABNORMAL LOW (ref 39.0–52.0)
Hemoglobin: 7.9 g/dL — ABNORMAL LOW (ref 13.0–17.0)
MCH: 29.3 pg (ref 26.0–34.0)
MCHC: 32.6 g/dL (ref 30.0–36.0)
MCV: 89.6 fL (ref 80.0–100.0)
Platelets: 103 10*3/uL — ABNORMAL LOW (ref 150–400)
RBC: 2.7 MIL/uL — ABNORMAL LOW (ref 4.22–5.81)
RDW: 20.5 % — ABNORMAL HIGH (ref 11.5–15.5)
WBC: 4.7 10*3/uL (ref 4.0–10.5)
nRBC: 0.4 % — ABNORMAL HIGH (ref 0.0–0.2)

## 2018-10-20 LAB — RENAL FUNCTION PANEL
Albumin: 2 g/dL — ABNORMAL LOW (ref 3.5–5.0)
Anion gap: 10 (ref 5–15)
BUN: 91 mg/dL — ABNORMAL HIGH (ref 8–23)
CO2: 24 mmol/L (ref 22–32)
Calcium: 7.6 mg/dL — ABNORMAL LOW (ref 8.9–10.3)
Chloride: 102 mmol/L (ref 98–111)
Creatinine, Ser: 5.89 mg/dL — ABNORMAL HIGH (ref 0.61–1.24)
GFR calc Af Amer: 10 mL/min — ABNORMAL LOW (ref >60)
GFR calc non Af Amer: 9 mL/min — ABNORMAL LOW (ref >60)
Glucose, Bld: 127 mg/dL — ABNORMAL HIGH (ref 70–99)
Phosphorus: 6.2 mg/dL — ABNORMAL HIGH (ref 2.5–4.6)
Potassium: 4.7 mmol/L (ref 3.5–5.1)
Sodium: 136 mmol/L (ref 135–145)

## 2018-10-20 LAB — GLUCOSE, CAPILLARY
Glucose-Capillary: 77 mg/dL (ref 70–99)
Glucose-Capillary: 160 mg/dL — ABNORMAL HIGH (ref 70–99)

## 2018-10-20 MED ORDER — INSULIN ASPART 100 UNIT/ML ~~LOC~~ SOLN: 0.0000 [IU] | Freq: Three times a day (TID) | SUBCUTANEOUS | 0 refills | Status: DC

## 2018-10-20 MED ORDER — DOXAZOSIN MESYLATE 2 MG PO TABS: 2.0000 mg | ORAL_TABLET | Freq: Every day | ORAL | 0 refills | Status: DC

## 2018-10-20 MED ORDER — HEPARIN SODIUM (PORCINE) 1000 UNIT/ML IJ SOLN
INTRAMUSCULAR | Status: AC
  Administered 2018-10-20: 3800 [IU]
  Filled 2018-10-20: qty 4

## 2018-10-20 MED ORDER — INSULIN STARTER KIT- SYRINGES (ENGLISH)
1.0000 | Freq: Once | Status: AC
  Administered 2018-10-20: 1

## 2018-10-20 MED ORDER — PREDNISONE 20 MG PO TABS: 60.0000 mg | ORAL_TABLET | Freq: Every day | ORAL | 0 refills | Status: DC

## 2018-10-20 MED ORDER — SULFAMETHOXAZOLE-TRIMETHOPRIM 400-80 MG PO TABS: 1.0000 | ORAL_TABLET | ORAL | 0 refills | Status: DC

## 2018-10-20 MED ORDER — CALCIUM ACETATE (PHOS BINDER) 667 MG PO CAPS: 667.0000 mg | ORAL_CAPSULE | Freq: Three times a day (TID) | ORAL | 0 refills | Status: AC

## 2018-10-20 MED ORDER — DOXAZOSIN MESYLATE 2 MG PO TABS
2.0000 mg | ORAL_TABLET | Freq: Every day | ORAL | Status: DC
  Administered 2018-10-20: 2 mg via ORAL
  Filled 2018-10-20: qty 1

## 2018-10-20 MED ORDER — PANTOPRAZOLE SODIUM 40 MG PO TBEC: 40.0000 mg | DELAYED_RELEASE_TABLET | Freq: Every day | ORAL | 0 refills | Status: DC

## 2018-10-20 MED ORDER — BLOOD GLUCOSE MONITOR KIT: PACK | 0 refills | Status: DC

## 2018-10-20 MED FILL — NOVOLOG FLEXPEN SYRINGE: 100 | 28 days supply | Qty: 6 | Fill #0

## 2018-10-20 MED FILL — CALCIUM ACETATE 667 MG CAP: 667 | 30 days supply | Qty: 90 | Fill #0

## 2018-10-20 MED FILL — PANTOPRAZOLE SOD DR 40 MG T: 40 | 30 days supply | Qty: 30 | Fill #0

## 2018-10-20 MED FILL — predniSONE 20 MG TABS: 20 | 30 days supply | Qty: 90 | Fill #0

## 2018-10-20 MED FILL — DOXAZOSIN MESYLATE 2 MG TAB: 2 | 30 days supply | Qty: 30 | Fill #0

## 2018-10-20 MED FILL — PENTIPS 31G X 8 MM MISC: 31G X 8 MM | 30 days supply | Qty: 100 | Fill #0

## 2018-10-20 MED FILL — SULFAMETHOXAZOLE-TMP SS TAB: 400-80 | 28 days supply | Qty: 12 | Fill #0

## 2018-10-20 NOTE — Progress Notes (Signed)
Noted consult for Diabetes Coordinator. Patient is hard of hearing.  Patient to be discharged home today on steroids and Novolog correction scale. Spoke with patient about glucose monitoring, goal glucose values, and insulin. Explained that patient is being discharged on Novolog insulin and the dose will depend on glucose value. Patient states that he has a glucometer at home and recently got some test strips for his glucometer. Informed patient he should be getting an insulin starter kit (syringes) from the nurse once it is received from pharmacy. Encouraged patient to read over information once received and explained he can use the information to refer back to at home for insulin injections.  Explained and demonstrated proper technique on how to draw up insulin with vial and syringe. Showed patient how to draw up air, inject air into vial, draw up insulin, and inject insulin. Patient demonstrated competency with insulin administration by vial and syringe. Patient states that he feels he will be able to give himself insulin at home if he has clear directions. Reminded patient that he will check glucose as MD directs before meals and depending on glucose value then he will take insulin if needed. Discussed hypoglycemia along with treatment. Encouraged patient to reach out to his PCP if he has any issues at all with hypoglycemia or if glucose is consistently staying over 200 mg/dl.  Patient verbalized understanding of information discussed and he states that he has no questions or concerns at this time. RN to continue working with patient on insulin injections before discharge and allow patient to administer own insulin injections while inpatient.   Thanks, Barnie Alderman, RN, MSN, CDE Diabetes Coordinator Inpatient Diabetes Program [phone] (Team Pager from 8am to 5pm)

## 2018-10-20 NOTE — Discharge Summary (Signed)
Physician Discharge Summary  [name]. YHC:[id number] DOB: 20-May-1946 DOA: 09/27/2018  PCP: [name], MD  Admit date: 09/27/2018 Discharge date: 10/20/2018  Admitted From:Home Disposition:Stable  Recommendations for Outpatient Follow-up:  1. Follow up with PCP in 1-2 weeks and w nephrology as scheduled 2. Please obtain BMP/CBC from nephro one week 3. Please follow up on the following pending results:  Home Health: None  Equipment/Devices: none  Discharge Condition:stable  CODE STATUS: full  Diet recommendation: Renal low k, diabetic diet  Brief/Interim Summary: 73 year old male with past medical history of diabetes mellitus, hypertension and COPD plus history of bladder cancer admitted on 3/31 after coming in with worsening renal failure after being sent over from his primary care doctor's office. Patient at that time related bouts of diarrhea for the past 3 weeks and also found to have a urinalysis with 3+ proteinuria and hematuria. Nephrology consulted and patient went renal biopsy on 10/10/18. 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 Uro was consulted and s/p  Cystoscopy and cleared for Rituxan infusion due to history of recurrent bladder cancer. he has been started on dialysis on 10/07/18 w Rt IJ HD cath.  4/22: OP HD has been set up  with [facility]. At this point patient has been stabilized medically, is on regular scheduled dialysis which is set up for TTS.  We will continue on steroids, prophylactic Bactrim, follow-up with nephrology for outpatient lab monitoring and rituximab infusion and renal function monitoring.  Discharge Diagnoses:   Acute renal failure: Status post renal biopsy 4/13 with pauci immune GN  and minimal scarring, follow-up started on systemic steroid to be tapered off  over 8 to 12 weeks, to cont at 60 mg/day for now, s/p rituximab IV x1 then in 24 day.on HD since 4/10, last HD 4/23. Has a right IJ dialysis catheter and is set up for outpatient dialysis TTS at [facility] starting 4/25 at 11 AM.Continue low K diet.  Discussed with the nephrologist this morning and he stable for discharge home today after dialysis.  Acute hypoxic respiratory failure from fluid overload due to renal failure.   Improved with dialysis.EF showed stable.  Pauci immune GN:See #1.Rituximab as above, steroid and Bactrim SS prophylaxis 3 times a week as per nephrology. TOC will arrange meds prior to d/c.  History of recurrent bladder cancer, seen by urology and underwent bedside cystoscopy and has no Recurrence.  Type 2 diabetes mellitus without complication, DVV6H 5.3 in 05/2018.  Stable mostly but was hypoglycemic at times. Advised close monitoring of blood sugar at home  and use modified sliding scale insulin only for high blood sugar >180, since patient is on high-dose steroids and will be continued on it for some time, he is at risk for uncontrolled hyperglycemia and its complication. Patient reports that his daughter is diabetic and knows how to do that.  Patient agreed to continue on the insulin and is educated. Pt educated on hypoglycemia how to recognize and treat.    CAD/HTN/HLD: Stable with no chest pain.  Decreased metoprolol to 25 given bradycardia and decrease Cardura.  Continue Zetia Lipitor and home Plavix   Anemia: hemoglobin stable, status post IV iron infusion, continue ESA per has a right IJ dialysis catheter  Tobacco abuse continue nicotine patch   Discharge Instructions  Discharge Instructions    Call MD for:   Complete by:  As directed  Please call call MD or return to ER for similar recurring problem, nausea/vomiting, uncontrolled pain, abdominal pain chest pain, shortness of breath, fever. Please follow-up your doctor as instructed in a  week time and call the office for appointment. Please avoid alcohol, smoking, or any other illicit substance.  HD is for TTS 2nd shift at the [facility] for a start date of 4/25 with arrival at 11:00 am.  He will need to sign papers on 4/24 at 10 am at the dialysis unit  Check blood sugar 3 times a day and bedtime at home. If blood sugar running above 200 less than 70 please call your MD to adjust insulin. If blood sugars running less 100 do not use insulin and call MD. If you noticed signs and symptoms of hypoglycemia or low blood sugar like jitteriness, confusion, thirst, tremor, sweating- Check blood sugar, drink sugary drink/biscuits/sweets to increase sugar level and call MD or return to ER.   Diet Carb Modified   Complete by:  As directed    Low potassium diet   Increase activity slowly   Complete by:  As directed      Allergies as of 10/20/2018      Reactions   Gelatin Other (See Comments)   ALPHA-GAL DANGER   Meat [alpha-gal] Other (See Comments)   REACTION TO HOOVED ANIMALS PARTICULARLY RED MEAT   Pork-derived Products Other (See Comments)   ALPHA-GAL DANGER   Shellfish Allergy Shortness Of Breath   Ramipril Swelling   Codeine Nausea And Vomiting   Morphine Itching      Medication List    STOP taking these medications   hydrochlorothiazide 25 MG tablet Commonly known as:  HYDRODIURIL   levocetirizine 5 MG tablet Commonly known as:  Xyzal   losartan 50 MG tablet Commonly known as:  COZAAR   metFORMIN 850 MG tablet Commonly known as:  GLUCOPHAGE     TAKE these medications   Accu-Chek Aviva Plus w/Device Kit Check FBS   atorvastatin 80 MG tablet Commonly known as:  LIPITOR TAKE 1 TABLET AT BEDTIME   blood glucose meter kit and supplies Kit Dispense based on patient and insurance preference. Use up to four times daily as directed. (FOR ICD-9 250.00, 250.01).   calcium acetate 667 MG capsule Commonly known as:  PHOSLO Take 1  capsule (667 mg total) by mouth 3 (three) times daily with meals for 30 days.   clopidogrel 75 MG tablet Commonly known as:  PLAVIX Take 1 tablet (75 mg total) by mouth daily.   docusate sodium 100 MG capsule Commonly known as:  COLACE Take 100 mg by mouth daily.   doxazosin 2 MG tablet Commonly known as:  CARDURA Take 1 tablet (2 mg total) by mouth daily for 30 days. What changed:    medication strength  how much to take   EPINEPHrine 0.3 mg/0.3 mL Soaj injection Commonly known as:  EPI-PEN Inject 0.3 mg into the muscle as directed.   ezetimibe 10 MG tablet Commonly known as:  ZETIA TAKE 1 TABLET BY MOUTH EVERY DAY   fenofibrate 160 MG tablet Take 1 tablet (160 mg total) by mouth daily.   glucose blood test strip Commonly known as:  Accu-Chek Aviva Plus Check FBS DX: E11.9   insulin aspart 100 UNIT/ML injection Commonly known as:  novoLOG Inject 0-5 Units into the skin 3 (three) times daily with meals. CBG 181-200:1 unit,CBG 201-250:2 units.CBG 251-300:3 units.CBG 301-350:5 U   metoprolol succinate 25 MG 24  hr tablet Commonly known as:  TOPROL-XL TAKE 1 TABLET BY MOUTH EVERY DAY   oxyCODONE-acetaminophen 10-325 MG tablet Commonly known as:  PERCOCET Take 1 tablet by mouth every 4 (four) hours as needed for pain.   pantoprazole 40 MG tablet Commonly known as:  PROTONIX Take 1 tablet (40 mg total) by mouth daily for 30 days.   predniSONE 20 MG tablet Commonly known as:  DELTASONE Take 3 tablets (60 mg total) by mouth daily with breakfast for 30 days.   sulfamethoxazole-trimethoprim 400-80 MG tablet Commonly known as:  BACTRIM Take 1 tablet by mouth 3 (three) times a week for 30 days. Start taking on:  October 21, 2018   Symbicort 160-4.5 MCG/ACT inhaler Generic drug:  budesonide-formoterol INHALE 2 PUFFS INTO THE LUNGS TWICE A DAY What changed:  See the new instructions.      Follow-up Information    [facility] [place] [facility].   Why:  office to call  you to arrange an appointment Contact information: [place] [address] [phone]        [name], MD Follow up in 1 week(s).   Specialty:  Family Medicine Contact information: [address] [place] [address] [phone]        Fay Records, MD .   Specialty:  Cardiology Contact information: [address] [place] [address] [phone]          Allergies  Allergen Reactions  . Gelatin Other (See Comments)    ALPHA-GAL DANGER  . Meat [Alpha-Gal] Other (See Comments)    REACTION TO HOOVED ANIMALS PARTICULARLY RED MEAT  . Pork-Derived Products Other (See Comments)    ALPHA-GAL DANGER  . Shellfish Allergy Shortness Of Breath  . Ramipril Swelling  . Codeine Nausea And Vomiting  . Morphine Itching    Consultations: Nephrology who will arrange for outpatient rituximab infusion as well as follow-up CBC CMP Urology  Procedures/Studies: Dg Chest 1 View  Result Date: 10/17/2018 CLINICAL DATA:  73 year old male with a history acute kidney injury EXAM: CHEST  1 VIEW COMPARISON:  No chest x-ray for comparison. Prior CT chest 03/02/2018 FINDINGS: Cardiomediastinal silhouette within normal limits. Fullness in the central vasculature. Interlobular septal thickening. Opacity at the medial aspect of the right upper lobe, appears relatively similar to the scout image from the CT dated 03/02/2018 and the scout image from thoracic spine CT 05/05/2018, likely accentuated by slight right rotation and low lung volumes. No confluent airspace disease.  No pneumothorax or pleural effusion. Right IJ tunneled hemodialysis catheter appears to terminate superior vena cava. IMPRESSION: Reticular opacities may reflect developing fibrosis, with early changes on the prior chest CT of 03/02/2018, however, mild edema could have this appearance. Right IJ tunneled hemodialysis catheter with the tip appearing to terminate superior vena cava.  Electronically Signed   By: Corrie Mckusick D.O.   On: 10/17/2018 16:36   US Renal  Result Date: 10/06/2018 CLINICAL DATA:  Acute kidney injury. EXAM: RENAL / URINARY TRACT ULTRASOUND COMPLETE COMPARISON:  09/27/2018 FINDINGS: Right Kidney: Renal measurements: 11.0 x 6.6 x 6.5 cm = volume: 245 mL. Mildly increased parenchymal echogenicity. No mass or hydronephrosis visualized. Left Kidney: Renal measurements: 12.1 x 5.9 x 5.3 cm = volume: 199 mL. Borderline increased parenchymal echogenicity. Small upper pole and interpolar cysts measuring 8 mm and 11 mm, respectively. No hydronephrosis. Bladder: Appears normal for degree of bladder distention. IMPRESSION: 1. No hydronephrosis. 2. Mildly echogenic kidneys compatible with  medical renal disease. Electronically Signed   By: Logan Bores M.D.   On: 10/06/2018 11:40   US Renal  Result Date: 09/27/2018 CLINICAL DATA:  Acute renal failure EXAM: RENAL / URINARY TRACT ULTRASOUND COMPLETE COMPARISON:  CT 05/06/2018 FINDINGS: Right Kidney: Renal measurements: 12.4 x 6.5 x 5.4 cm = volume: 229 mL . Echogenicity within normal limits. No mass or hydronephrosis visualized. Left Kidney: Renal measurements: 14.5 x 5.7 x 6.5 cm = volume: 279 mL. Small cysts in the left kidney, the largest 1.2 cm. Normal echotexture. No hydronephrosis. Bladder: Appears normal for degree of bladder distention. IMPRESSION: No acute findings.  No hydronephrosis. Electronically Signed   By: Rolm Baptise M.D.   On: 09/27/2018 11:16   Ir Fluoro Guide Cv Line Right  Result Date: 10/17/2018 CLINICAL DATA:  End-stage renal disease and need for tunneled hemodialysis catheter. EXAM: TUNNELED CENTRAL VENOUS HEMODIALYSIS CATHETER PLACEMENT WITH ULTRASOUND AND FLUOROSCOPIC GUIDANCE ANESTHESIA/SEDATION: 1.0 mg IV Versed; 25 mcg IV Fentanyl. Total Moderate Sedation Time:   15 minutes. The patient's level of consciousness and physiologic status were continuously monitored during the procedure by Radiology  nursing. MEDICATIONS: 2 g IV Ancef. FLUOROSCOPY TIME:  54 seconds.  12.9 mGy. PROCEDURE: The procedure, risks, benefits, and alternatives were explained to the patient. Questions regarding the procedure were encouraged and answered. The patient understands and consents to the procedure. A timeout was performed prior to initiating the procedure. The right neck and chest were prepped with chlorhexidine in a sterile fashion, and a sterile drape was applied covering the operative field. Maximum barrier sterile technique with sterile gowns and gloves were used for the procedure. Local anesthesia was provided with 1% lidocaine. Ultrasound was used to confirm patency of the right internal jugular vein. After creating a small venotomy incision, a 21 gauge needle was advanced into the right internal jugular vein under direct, real-time ultrasound guidance. Ultrasound image documentation was performed. After securing guidewire access, an 8 Fr dilator was placed. A J-wire was kinked to measure appropriate catheter length. A Palindrome tunneled hemodialysis catheter measuring 23 cm from tip to cuff was chosen for placement. This was tunneled in a retrograde fashion from the chest wall to the venotomy incision. At the venotomy, serial dilatation was performed and a 16 Fr peel-away sheath was placed over a guidewire. The catheter was then placed through the sheath and the sheath removed. Final catheter positioning was confirmed and documented with a fluoroscopic spot image. The catheter was aspirated, flushed with saline, and injected with appropriate volume heparin dwells. The venotomy incision was closed with subcutaneous 3-0 Monocryl and subcuticular 4-0 Vicryl. Dermabond was applied to the incision. The catheter exit site was secured with 0-Prolene retention sutures. COMPLICATIONS: None.  No pneumothorax. FINDINGS: After catheter placement, the tip lies in the right atrium. The catheter aspirates normally and is ready for  immediate use. IMPRESSION: Placement of tunneled hemodialysis catheter via the right internal jugular vein. The catheter tip lies in the right atrium. The catheter is ready for immediate use. Electronically Signed   By: Aletta Edouard M.D.   On: 10/17/2018 16:41   Ir Fluoro Guide Cv Line Right  Result Date: 10/07/2018 CLINICAL DATA:  Renal failure, needs venous access for hemodialysis EXAM: EXAM RIGHT IJ CATHETER PLACEMENT UNDER ULTRASOUND AND FLUOROSCOPIC GUIDANCE TECHNIQUE: The procedure, risks (including but not limited to bleeding, infection, organ damage, pneumothorax), benefits, and alternatives were explained to the patient. Questions regarding the procedure were encouraged and answered. The patient understands and consents  to the procedure. Patency of the right IJ vein was confirmed with ultrasound with image documentation. An appropriate skin site was determined. Skin site was marked. Region was prepped using maximum barrier technique including cap and mask, sterile gown, sterile gloves, large sterile sheet, and Chlorhexidine as cutaneous antisepsis. The region was infiltrated locally with 1% lidocaine. Under real-time ultrasound guidance, the right IJ vein was accessed with a 21 gauge needle; the needle tip within the vein was confirmed with ultrasound image documentation. The needle exchanged over a 018 guidewire for vascular dilator which allowed advancement of a 20 cm Mahurkar catheter. This was positioned with the tip at the cavoatrial junction. Spot chest radiograph shows good positioning and no pneumothorax. Catheter was flushed and sutured externally with 0-Prolene sutures. Patient tolerated the procedure well. FLUOROSCOPY TIME:  Less than 0.1 minute; 15 uGym2 DAP COMPLICATIONS: COMPLICATIONS none IMPRESSION: 1. Technically successful right IJ Mahurkar catheter placement. Electronically Signed   By: Lucrezia Europe M.D.   On: 10/07/2018 13:07   Ir US Guide Vasc Access Right  Result Date:  10/17/2018 CLINICAL DATA:  End-stage renal disease and need for tunneled hemodialysis catheter. EXAM: TUNNELED CENTRAL VENOUS HEMODIALYSIS CATHETER PLACEMENT WITH ULTRASOUND AND FLUOROSCOPIC GUIDANCE ANESTHESIA/SEDATION: 1.0 mg IV Versed; 25 mcg IV Fentanyl. Total Moderate Sedation Time:   15 minutes. The patient's level of consciousness and physiologic status were continuously monitored during the procedure by Radiology nursing. MEDICATIONS: 2 g IV Ancef. FLUOROSCOPY TIME:  54 seconds.  12.9 mGy. PROCEDURE: The procedure, risks, benefits, and alternatives were explained to the patient. Questions regarding the procedure were encouraged and answered. The patient understands and consents to the procedure. A timeout was performed prior to initiating the procedure. The right neck and chest were prepped with chlorhexidine in a sterile fashion, and a sterile drape was applied covering the operative field. Maximum barrier sterile technique with sterile gowns and gloves were used for the procedure. Local anesthesia was provided with 1% lidocaine. Ultrasound was used to confirm patency of the right internal jugular vein. After creating a small venotomy incision, a 21 gauge needle was advanced into the right internal jugular vein under direct, real-time ultrasound guidance. Ultrasound image documentation was performed. After securing guidewire access, an 8 Fr dilator was placed. A J-wire was kinked to measure appropriate catheter length. A Palindrome tunneled hemodialysis catheter measuring 23 cm from tip to cuff was chosen for placement. This was tunneled in a retrograde fashion from the chest wall to the venotomy incision. At the venotomy, serial dilatation was performed and a 16 Fr peel-away sheath was placed over a guidewire. The catheter was then placed through the sheath and the sheath removed. Final catheter positioning was confirmed and documented with a fluoroscopic spot image. The catheter was aspirated, flushed  with saline, and injected with appropriate volume heparin dwells. The venotomy incision was closed with subcutaneous 3-0 Monocryl and subcuticular 4-0 Vicryl. Dermabond was applied to the incision. The catheter exit site was secured with 0-Prolene retention sutures. COMPLICATIONS: None.  No pneumothorax. FINDINGS: After catheter placement, the tip lies in the right atrium. The catheter aspirates normally and is ready for immediate use. IMPRESSION: Placement of tunneled hemodialysis catheter via the right internal jugular vein. The catheter tip lies in the right atrium. The catheter is ready for immediate use. Electronically Signed   By: Aletta Edouard M.D.   On: 10/17/2018 16:41   Ir US Guide Vasc Access Right  Result Date: 10/07/2018 CLINICAL DATA:  Renal failure, needs venous access  for hemodialysis EXAM: EXAM RIGHT IJ CATHETER PLACEMENT UNDER ULTRASOUND AND FLUOROSCOPIC GUIDANCE TECHNIQUE: The procedure, risks (including but not limited to bleeding, infection, organ damage, pneumothorax), benefits, and alternatives were explained to the patient. Questions regarding the procedure were encouraged and answered. The patient understands and consents to the procedure. Patency of the right IJ vein was confirmed with ultrasound with image documentation. An appropriate skin site was determined. Skin site was marked. Region was prepped using maximum barrier technique including cap and mask, sterile gown, sterile gloves, large sterile sheet, and Chlorhexidine as cutaneous antisepsis. The region was infiltrated locally with 1% lidocaine. Under real-time ultrasound guidance, the right IJ vein was accessed with a 21 gauge needle; the needle tip within the vein was confirmed with ultrasound image documentation. The needle exchanged over a 018 guidewire for vascular dilator which allowed advancement of a 20 cm Mahurkar catheter. This was positioned with the tip at the cavoatrial junction. Spot chest radiograph shows good  positioning and no pneumothorax. Catheter was flushed and sutured externally with 0-Prolene sutures. Patient tolerated the procedure well. FLUOROSCOPY TIME:  Less than 0.1 minute; 15 uGym2 DAP COMPLICATIONS: COMPLICATIONS none IMPRESSION: 1. Technically successful right IJ Mahurkar catheter placement. Electronically Signed   By: Lucrezia Europe M.D.   On: 10/07/2018 13:07   US Biopsy (kidney)  Result Date: 10/10/2018 INDICATION: 26 year old with acute kidney injury. Request for random renal biopsy. EXAM: ULTRASOUND-GUIDED LEFT RENAL BIOPSY MEDICATIONS: None. ANESTHESIA/SEDATION: Moderate (conscious) sedation was employed during this procedure. A total of Versed 1.5 mg and Fentanyl 50 mcg was administered intravenously. Moderate Sedation Time: 17 minutes. The patient's level of consciousness and vital signs were monitored continuously by radiology nursing throughout the procedure under my direct supervision. FLUOROSCOPY TIME:  None COMPLICATIONS: None immediate. PROCEDURE: Informed written consent was obtained from the patient after a thorough discussion of the procedural risks, benefits and alternatives. All questions were addressed. Maximal Sterile Barrier Technique was utilized including caps, mask, sterile gowns, sterile gloves, sterile drape, hand hygiene and skin antiseptic. A timeout was performed prior to the initiation of the procedure. Both kidneys were evaluated with ultrasound. The left kidney was selected for biopsy. Left flank was prepped with chlorhexidine and sterile field was created. Skin and soft tissues were anesthetized with 1% lidocaine. Using ultrasound guidance, 16 gauge core biopsy needle was directed into the left kidney lower pole. A total of 2 core biopsies were obtained. Specimens placed in saline. Bandage placed over the puncture site. FINDINGS: Negative for hydronephrosis. Core biopsies obtained from the left kidney lower pole. Negative for bleeding or hematoma formation following the  core biopsies. IMPRESSION: Ultrasound-guided core biopsies of the left kidney lower pole. Electronically Signed   By: Markus Daft M.D.   On: 10/10/2018 17:15     Subjective: Resting well in dialysis unit.  No new complaints.  Eager to go home today.  Discharge Exam: Vitals:   10/20/18 1025 10/20/18 1058  BP: (!) 147/80 (!) 150/84  Pulse: 88 91  Resp: 14 15  Temp: 97.6 F (36.4 C) 97.7 F (36.5 C)  SpO2: 96% 99%   Vitals:   10/20/18 0930 10/20/18 1000 10/20/18 1025 10/20/18 1058  BP: 114/68 121/71 (!) 147/80 (!) 150/84  Pulse: 91 86 88 91  Resp:   14 15  Temp:   97.6 F (36.4 C) 97.7 F (36.5 C)  TempSrc:   Oral Oral  SpO2:   96% 99%  Weight:      Height:  General: Pt is alert, awake, not in acute distress Cardiovascular: RRR, S1/S2 +, no rubs, no gallops Respiratory: CTA bilaterally, no wheezing, no rhonchi Abdominal: Soft, NT, ND, bowel sounds + Extremities: no edema, no cyanosis   The results of significant diagnostics from this hospitalization (including imaging, microbiology, ancillary and laboratory) are listed below for reference.     Microbiology: No results found for this or any previous visit (from the past 240 hour(s)).   Labs: BNP (last 3 results) No results for input(s): BNP in the last 8760 hours. Basic Metabolic Panel: Recent Labs  Lab 10/16/18 0406 10/17/18 0411 10/18/18 0438 10/19/18 0513 10/20/18 0543  NA 135 133* 137 138 136  K 3.8 4.1 5.2* 4.6 4.7  CL 98 99 99 102 102  CO2 _0 21* 24  GLUCOSE 90 102* 105* 78 127*  BUN 31* 68* 96* 64* 91*  CREATININE 3.08* 4.70* 6.31* 4.73* 5.89*  CALCIUM 7.3* 7.4* 8.0* 7.3* 7.6*  PHOS 4.9* 6.0* 8.7* 5.3* 6.2*   Liver Function Tests: Recent Labs  Lab 10/16/18 0406 10/17/18 0411 10/18/18 0438 10/19/18 0513 10/20/18 0543  ALBUMIN 1.7* 1.7* 2.0* 1.6* 2.0*   No results for input(s): LIPASE, AMYLASE in the last 168 hours. No results for input(s): AMMONIA in the last 168  hours. CBC: Recent Labs  Lab 10/13/18 1722 10/14/18 0404 10/15/18 1041 10/18/18 1800 10/20/18 1046  WBC 4.2 7.2 6.3 6.8 4.7  NEUTROABS  --   --  5.3  --   --   HGB 8.5* 8.5* 8.5* 8.7* 7.9*  HCT 26.3* 26.0* 25.8* 26.2* 24.2*  MCV 86.5 86.1 86.9 88.8 89.6  PLT 114* 123* 152 144* PENDING   Cardiac Enzymes: No results for input(s): CKTOTAL, CKMB, CKMBINDEX, TROPONINI in the last 168 hours. BNP: Invalid input(s): POCBNP CBG: Recent Labs  Lab 10/19/18 1122 10/19/18 1153 10/19/18 1642 10/19/18 2124 10/20/18 1056  GLUCAP 60* 109* 165* 233* 77   D-Dimer No results for input(s): DDIMER in the last 72 hours. Hgb A1c No results for input(s): HGBA1C in the last 72 hours. Lipid Profile No results for input(s): CHOL, HDL, LDLCALC, TRIG, CHOLHDL, LDLDIRECT in the last 72 hours. Thyroid function studies No results for input(s): TSH, T4TOTAL, T3FREE, THYROIDAB in the last 72 hours.  Invalid input(s): FREET3 Anemia work up No results for input(s): VITAMINB12, FOLATE, FERRITIN, TIBC, IRON, RETICCTPCT in the last 72 hours. Urinalysis    Component Value Date/Time   COLORURINE RED (A) 10/08/2018 1613   APPEARANCEUR CLOUDY (A) 10/08/2018 1613   LABSPEC 1.010 10/08/2018 1613   PHURINE 7.0 10/08/2018 1613   GLUCOSEU 100 (A) 10/08/2018 1613   HGBUR LARGE (A) 10/08/2018 1613   BILIRUBINUR NEGATIVE 10/08/2018 1613   KETONESUR NEGATIVE 10/08/2018 1613   PROTEINUR >300 (A) 10/08/2018 1613   NITRITE NEGATIVE 10/08/2018 1613   LEUKOCYTESUR TRACE (A) 10/08/2018 1613   Sepsis Labs Invalid input(s): PROCALCITONIN,  WBC,  LACTICIDVEN Microbiology No results found for this or any previous visit (from the past 240 hour(s)).   Time coordinating discharge: 35 minutes  SIGNED:   Antonieta Pert, MD  [facility] 10/20/2018, 11:16 AM  If 7PM-7AM, please contact night-coverage [email or web address]

## 2018-10-20 NOTE — Discharge Instructions (Signed)
Correction Insulin  Correction insulin, also called corrective insulin or a supplemental dose, is a small amount of insulin that can be used to lower your blood sugar (glucose) if it is too high. You may be instructed to check your blood glucose at certain times of the day and to use correction insulin as needed to lower your blood glucose to your target range. Correction insulin is primarily used as part of diabetes management. It may also be prescribed for people who do not have diabetes. What is a correction scale? A correction scale, also called a sliding scale, is prescribed by your health care provider to help you determine when you need correction insulin. Your correction scale is based on your individual treatment goals, and it has two parts:  Ranges of blood glucose levels.  How much correction insulin to give yourself if your blood sugar falls within a certain range. If your blood glucose is in your desired range, you will not need correction insulin and you should take your normal insulin dose. What type of insulin do I need? Your health care provider may prescribe rapid-acting or short-acting insulin for you to use as correction insulin. Rapid-acting insulin:  Starts working quickly, in as little as 5 minutes.  Can last for 3-6 hours.  Works well when taken right before a meal to quickly lower blood glucose. Short-acting insulin:  Starts working in about 30 minutes.  Can last for 6-8 hours.  Should be taken about 30 minutes before you start eating a meal. Talk with your health care provider or pharmacist about which type of correction insulin to take and when to take it. If you use insulin to control your diabetes, you should use correction insulin in addition to the longer-acting (basal) insulin that you normally use. How do I manage my blood glucose with correction insulin? Giving a correction dose  Check your blood glucose as directed by your health care  provider.  Use your correction scale to find the range that your blood glucose is in.  Identify the units of insulin that match your blood glucose range.  Make sure you have food available that you can eat in the next 15-30 minutes, after your correction dose.  Give yourself the dose of correction insulin that your health care provider has prescribed in your correction scale. Always make sure you are using the right type of insulin. ? If your correction insulin is rapid-acting, start eating a meal within 15 minutes after your correction dose to keep your blood glucose from getting too low. ? If your correction insulin is short-acting, start eating a meal within 30 minutes after your correction dose to keep your blood glucose from getting too low. Keeping a blood glucose log   Write down your blood glucose test results and the amount of insulin that you give yourself. Do this every time you check blood glucose or take insulin. Bring this log with you to your medical visits. This information will help your health care provider to manage your medicines.  Note anything that may affect your blood glucose, such as: ? Changes in normal exercise or activity. ? Changes in your normal schedule, such as changes in your sleep routine, going on vacation, changing your diet, or holidays. ? New over-the-counter or prescription medicines. ? Illness, stress, or anxiety. ? Changes in the time that you took your medicine or insulin. ? Changes in your meals, such as skipping a meal, having a late meal, or dining out. ?  Eating things that may affect blood glucose, such as snacks, meal portions that are larger than normal, drinks that contain sugar, or eating less than usual. What do I need to know about hyperglycemia and hypoglycemia? What is hyperglycemia? Hyperglycemia, also called high blood glucose, occurs when blood glucose is too high. Make sure you know the early signs of hyperglycemia, such  as:  Increased thirst.  Hunger.  Feeling very tired.  Needing to urinate more often than usual.  Blurry vision. What is hypoglycemia? Hypoglycemia is also called low blood glucose. Be aware of stacking your insulin doses. This happens when you correct a high blood glucose by giving yourself extra insulin too soon after a previous correction dose or mealtime dose. This may cause you to have too much insulin in your body and may put you at risk for hypoglycemia. Hypoglycemia occurs with a blood glucose level at or below 70 mg/dL (3.9 mmol/L). It is important to know the symptoms of hypoglycemia and treat it right away. Always have a 15-gram rapid-acting carbohydrate snack with you to treat low blood glucose. Family members and close friends should also know the symptoms and should understand how to treat hypoglycemia, in case you are not able to treat yourself. What are the symptoms of hypoglycemia? Hypoglycemia symptoms can include:  Hunger.  Anxiety.  Sweating and feeling clammy.  Confusion.  Dizziness or light-headedness.  Sleepiness.  Nausea.  Increased heart rate.  Headache.  Blurry vision.  Jerky movements that you cannot control (seizure).  Nightmares.  Tingling or numbness around the mouth, lips, or tongue.  A change in speech.  Decreased ability to concentrate.  A change in coordination.  Restless sleep.  Tremors or shakes.  Fainting.  Irritability. How do I treat hypoglycemia? If you are alert and able to swallow safely, follow the 15:15 rule:  Take 15 grams of a rapid-acting carbohydrate. Rapid-acting options include: ? 1 tube of glucose gel. ? 3 glucose pills. ? 6-8 pieces of hard candy. ? 4 oz (120 mL) of fruit juice. ? 4 oz (120 mL) of regular (not diet) soda.  Check your blood glucose 15 minutes after you take the carbohydrate. ? If the repeat blood glucose level is still at or below 70 mg/dL (3.9 mmol/L), take 15 grams of a  carbohydrate again. ? If your blood glucose level does not increase above 70 mg/dL (3.9 mmol/L) after 3 tries, seek emergency medical care.  After your blood glucose level returns to normal, eat a meal or a snack within 1 hour.  How do I treat severe hypoglycemia? Severe hypoglycemia is when your blood glucose level is at or below 54 mg/dL (3 mmol/L). Severe hypoglycemia is an emergency. Do not wait to see if the symptoms will go away. Get medical help right away. Call your local emergency services (911 in the [place] Do not drive yourself to the hospital. If you have severe hypoglycemia and you cannot eat or drink, you may need an injection of glucagon. A family member or close friend should learn how to check your blood glucose and how to give you a glucagon injection. Ask your health care provider if you need to have an emergency glucagon injection kit available. Severe hypoglycemia may need to be treated in a hospital. The treatment may include getting glucose through an IV tube. You may also need treatment for the cause of your hypoglycemia. Why do I need correction insulin if I do not have diabetes? If you do not have diabetes,  your health care provider may prescribe insulin because:  Keeping your blood glucose in the target range is important for your overall health.  You are taking medicines that cause your blood glucose to be higher than normal. Contact a health care provider if:  You develop low blood glucose that you are not able to treat yourself.  You have high blood glucose that you are not able to correct with correction insulin.  Your blood glucose is often too low.  You used emergency glucagon to treat low blood glucose. Get help right away if:  You become unresponsive. If this happens, someone else should call emergency services (911 in the [place] right away.  Your blood glucose is lower than 54 mg/dL (3.0 mmol/L).  You become confused or you have trouble thinking  clearly.  You have difficulty breathing. Summary  Correction insulin is a small amount of insulin that can be used to lower your blood sugar (glucose) if it is too high.  Talk with your health care provider or pharmacist about which type of correction insulin to take and when to take it. If you use insulin to control your diabetes, you should use correction insulin in addition to the longer-acting (basal) insulin that you normally use.  You may be instructed to check your blood glucose at certain times of the day and to use correction insulin as needed to lower your blood glucose to your target range. Always keep a log of your blood glucose values and the amount of insulin that you used.  It is important to know the symptoms of hypoglycemia and treat it right away. Always have a 15-gram rapid-acting carbohydrate snack with you to treat low blood glucose. Family members and close friends should also know the symptoms and should understand how to treat hypoglycemia, in case you are not able to treat yourself. This information is not intended to replace advice given to you by your health care provider. Make sure you discuss any questions you have with your health care provider. Document Released: 11/06/2010 Document Revised: 03/13/2016 Document Reviewed: 03/13/2016 Elsevier Interactive Patient Education  2019 [employer] for [employer] on [employer] Tips Use very little or no salt and other salt seasonings when cooking. Add lemon juice, herbs, and spices that do not contain salt to add flavor. Measure portions of food and ingredients using standard measuring cups and spoons. Weigh portions of high-protein foods using a kitchen scale. For soaking vegetables to lower potassium, peel and cut into pieces that are approximately 1/8 inch thick. Rinse and soak them in warm water for at least 2 hours. Use approximately 10 times more water than vegetables. Drain and rinse them under warm  water, and then boil them for 5 minutes. Soaked potatoes can be made into several dishes. For instance, they can be french fried, mashed, boiled, home fried with onions, or scalloped.  Label Reading Tips  Sodium (Salt) Terms and Their Meanings Sodium Free - Very little sodium in each serving Very Low Sodium - 35 milligrams of sodium or less in each serving Low Sodium - 140 milligrams of sodium or less in each serving Reduced Sodium - Sodium has been lowered by 25% Light in Sodium - Sodium has been lowered by at least 50%  Ingredient List If salt or sodium is listed as one of the first five ingredients, the food is high in sodium and should not be bought.  Nutrition Labels Food labels show milligrams (mg) of sodium. A sodium level  of 300 milligrams or more in each serving is too high in sodium for you to eat. It will help you to avoid eating foods that have the words calcium fortified on the food labels. Potassium and phosphorus are not required by law to be shown on the nutritional label. They are sometimes listed as percent (%) Daily Value (DV). DV is the percentage of the nutrients level in a 2,000-calorie diet as shown below.  Nutrient Daily Value in Milligrams Potassium: 3,500 Phosphorus: 1,000 Calcium: 1,000 Example: If a food product has 10% DV for potassium, then 10% of 3,500 milligrams is 350 milligrams of potassium.  Psychologist, clinical for Those with Kidney Disease ([place]; Diet and Nutrition Education) Read Labels: Protein, potassium, and sodium, for the most part, are easy to identify by reading the ingredient list on the Nutrition Facts labels on foods. On the other hand, phosphorus and calcium may be listed only as a percentage of the daily requirement or as added vitamins and minerals. Avoid processed foods to reduce your intake of sodium and phosphorus. Phosphorus is widely used in processed meats, leavening agents, and as an  anti-caking agent in some powdered drink mixes. Phosphorus and polyphosphates are also used as emulsifiers in some frozen fish and chicken ("enhanced" meats are significantly higher in sodium than fresh meats). Select fresh or frozen vegetables instead of the higher-sodium canned products. Watch out for added calcium on food labels if you have been advised to watch your calcium intake. Avoid any food that is calcium enriched or calcium fortified if your doctor or dietitian has recommended you watch your intake of calcium.

## 2018-10-20 NOTE — Progress Notes (Signed)
Patient ID: [name]., male   DOB: 1945-09-19, 73 y.o.   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  Transitioned to rituximab as below for hx bladder cancer.  Note Cr 1 in 06/2018 per Epic - On HD since 4/10.  Tunneled HD catheter placed on 4/20 - Remains dialysis-dependent - HD today at [facility] - He has been on HD on a TTS basis and is approved for outpatient dialysis as an AKI on TTS 2nd shift at the [facility] for a start date of 4/25 with arrival at 11:00 am.  He will need to sign papers on 4/24 at 10 am at the dialysis unit.  Patient is aware and voiced understanding of these plans.  I have called orders to [facility]  - Strict ins/outs   - Hopeful for renal recovery which may be delayed  - low k diet  # Pauci-immune GN - Continue prednisone 60 mg daily.  Will need to be discharged on this dose to continue for four weeks until 5/13.  Will then be tapered as an outpatient by renal to a goal of 20 mg daily dosing by 6/10.  Note that the 1st steroids were given on 4/15.  - Added pantoprazole for GI ppx and would need to be discharged on PPI  - transitioned from cytoxan based regimen to rituximab given his history of bladder cancer  - s/p Rituximab 1 gram once (4/22) and in 14 days.  Our office will arrange outpatient administration for the rituximab two weeks from now.  Our office will arrange weekly labs CBC, CMP with results to me for ease of following his AKI and monitoring response to immunosuppression - Single strength bactrim three times a week for ppx added as well.  Needs to discharge on this medication  # HTN - controlled  - reduced metoprolol XL to 25 mg daily given bradycardia  - reduced cardura to 2  mg daily  # Hyperphosphatemia: Secondary to acute kidney injury; on hemodialysis/renal diet. - on phoslo and HD   #  Hx bladder cancer  - Urology consulted and cleared from their point of view for cytoxan if needed.  No recurrence by cysto - note that we have changed to rituximab   # Anemia: Status post intravenous iron, continue ESA.  No acute indication for PRBC's - stable   Disposition:  Patient is cleared for discharge today (4/23) from a renal standpoint after dialysis here at [facility].  He voiced understanding.    Subjective:   Seen on HD.  BP 92/57 and HR 88 with goal reduced to 1.5 kg.  Patient has had no UOP charted over 4/22 - no strict in/outs.  Got Rituximab yesterday without issues.  He states that he feels well today and is excited about going home.  He provides his cell phone as [phone] and states that his daughter, [name], is an alternate contact at [phone] if we can't get to him.  Review of systems:  Denies shortness of breath or chest pain Denies nausea or vomiting  Denies chest pain     Objective:   BP 109/70   Pulse 88   Temp 97.8 F (36.6 C) (Oral)   Resp 18   Ht 5\' 10"  (1.778  m)   Wt 85.5 kg   SpO2 100%   BMI 27.05 kg/m   Intake/Output Summary (Last 24 hours) at 10/20/2018 7517 Last data filed at 10/20/2018 0700 Gross per 24 hour  Intake 360 ml  Output 0 ml  Net 360 ml   Weight change: 0 kg  Physical Exam:  Gen: elderly male in bed in no acute distress CVS: Pulse regular rhythm, normal rate Resp: Clear to auscultation bilaterally, no rales/rhonchi.   Access: right chest tunneled dialysis catheter  Abd: Soft, flat, nontender Ext: no edema bilateral lower extremities   Imaging: No results found.  Labs: BMET Recent Labs  Lab 10/14/18 0404 10/15/18 1041 10/16/18 0406 10/17/18 0411 10/18/18 0438 10/19/18 0513 10/20/18 0543  NA 132* 133* 135 133* 137 138 136  K 4.2 4.1 3.8 4.1 5.2* 4.6 4.7  CL 95* 97* 98 99 99 102 102   CO2 31 26 28 27 24  21* 24  GLUCOSE 146* 161* 90 102* 105* 78 127*  BUN 41* 34* 31* 68* 96* 64* 91*  CREATININE 5.60* 4.11* 3.08* 4.70* 6.31* 4.73* 5.89*  CALCIUM 7.9* 7.4* 7.3* 7.4* 8.0* 7.3* 7.6*  PHOS 6.5* 5.6* 4.9* 6.0* 8.7* 5.3* 6.2*   CBC Recent Labs  Lab 10/13/18 1722 10/14/18 0404 10/15/18 1041 10/18/18 1800  WBC 4.2 7.2 6.3 6.8  NEUTROABS  --   --  5.3  --   HGB 8.5* 8.5* 8.5* 8.7*  HCT 26.3* 26.0* 25.8* 26.2*  MCV 86.5 86.1 86.9 88.8  PLT 114* 123* 152 144*    Medications:    . atorvastatin  80 mg Oral QHS  . calcium acetate  667 mg Oral TID WC  . Chlorhexidine Gluconate Cloth  6 each Topical Q0600  . clopidogrel  75 mg Oral Daily  . darbepoetin (ARANESP) injection - NON-DIALYSIS  100 mcg Subcutaneous Q Sun-1800  . doxazosin  4 mg Oral Daily  . ezetimibe  10 mg Oral Daily  . feeding supplement (PRO-STAT SUGAR FREE 64)  30 mL Oral TID  . fenofibrate  160 mg Oral Daily  . insulin aspart  0-5 Units Subcutaneous QHS  . insulin aspart  0-9 Units Subcutaneous TID WC  . loratadine  10 mg Oral QHS  . metoprolol succinate  25 mg Oral Daily  . multivitamin  1 tablet Oral QHS  . nicotine  21 mg Transdermal QHS  . pantoprazole  40 mg Oral Daily  . predniSONE  60 mg Oral Q breakfast  . sulfamethoxazole-trimethoprim  1 tablet Oral Once per day on Mon Wed Fri    Claudia Desanctis 10/20/2018

## 2018-10-20 NOTE — Progress Notes (Signed)
DISCHARGE NOTE HOME [name]. to be discharged Home per MD order. Discussed prescriptions and follow up appointments with the patient. Prescriptions given to patient; medication list explained in detail. Patient verbalized understanding.  Skin clean, dry and intact without evidence of skin break down, no evidence of skin tears noted. IV catheter discontinued intact. Site without signs and symptoms of complications. Dressing and pressure applied. Pt denies pain at the site currently. No complaints noted.  Patient free of lines, drains, and wounds.   An After Visit Summary (AVS) was printed and given to the patient. Patient escorted via wheelchair, and discharged home via private auto.  Aneta Mins BSN, RN3

## 2018-10-20 NOTE — TOC Transition Note (Signed)
Transition of Care [facility]) - CM/SW Discharge Note   Patient Details  Name: [name]. MRN: [id number] Date of Birth: [birth year]  Transition of Care [facility] CM/SW Contact:  Carles Collet, RN Phone Number: 10/20/2018, 11:10 AM   Clinical Narrative:     Patient clipped, will DC to home with support of family.     Final next level of care: Home/Self Care Barriers to Discharge: No Barriers Identified   Patient Goals and CMS Choice        Discharge Placement                       Discharge Plan and Services   Discharge Planning Services: CM Consult                                 Social Determinants of Health (SDOH) Interventions     Readmission Risk Interventions Readmission Risk Prevention Plan 10/20/2018  Transportation Screening Complete  Medication Review ([place]) Complete  PCP or Specialist appointment within 3-5 days of discharge Not Complete  PCP/Specialist Appt Not Complete comments office to call and schedule  HRI or [place] Complete  SW Recovery Care/Counseling Consult Complete  [place] Not Applicable  Some recent data might be hidden

## 2018-10-21 ENCOUNTER — Other Ambulatory Visit: Payer: Self-pay | Admitting: Family Medicine

## 2018-10-21 ENCOUNTER — Telehealth: Payer: Self-pay | Admitting: Family Medicine

## 2018-10-21 MED ORDER — OXYCODONE-ACETAMINOPHEN 10-325 MG PO TABS: 1.0000 | ORAL_TABLET | ORAL | 0 refills | Status: DC | PRN

## 2018-10-21 NOTE — Addendum Note (Signed)
Addended by: Delsa Grana on: 10/21/2018 02:23 PM   Modules accepted: Orders

## 2018-10-21 NOTE — Telephone Encounter (Signed)
Patient requesting a refill on Oxycodone     LOV:  09/27/18  LRF:  08/29/18

## 2018-10-21 NOTE — Progress Notes (Signed)
E-prescribing error and PCP gone, resent narcotic meds per PCP dosing/# dispensed Delsa Grana, PA-C

## 2018-10-21 NOTE — Telephone Encounter (Signed)
Refill on oxycodone to [facility]

## 2018-10-21 NOTE — Addendum Note (Signed)
Addended by: Sheral Flow on: 10/21/2018 02:21 PM   Modules accepted: Orders

## 2018-10-21 NOTE — Telephone Encounter (Signed)
This was sent over this am

## 2018-10-22 DIAGNOSIS — D689 Coagulation defect, unspecified: Secondary | ICD-10-CM | POA: Insufficient documentation

## 2018-10-22 DIAGNOSIS — N179 Acute kidney failure, unspecified: Secondary | ICD-10-CM | POA: Diagnosis not present

## 2018-10-24 ENCOUNTER — Other Ambulatory Visit: Payer: Self-pay

## 2018-10-24 ENCOUNTER — Encounter: Payer: Self-pay | Admitting: Family Medicine

## 2018-10-24 ENCOUNTER — Ambulatory Visit (INDEPENDENT_AMBULATORY_CARE_PROVIDER_SITE_OTHER): Payer: Medicare HMO | Admitting: Family Medicine

## 2018-10-24 ENCOUNTER — Encounter (HOSPITAL_COMMUNITY): Payer: Self-pay

## 2018-10-24 VITALS — BP 166/90 | HR 90 | Temp 98.1°F | Resp 24 | Ht 70.0 in | Wt 193.0 lb

## 2018-10-24 DIAGNOSIS — N059 Unspecified nephritic syndrome with unspecified morphologic changes: Secondary | ICD-10-CM

## 2018-10-24 DIAGNOSIS — R0602 Shortness of breath: Secondary | ICD-10-CM

## 2018-10-24 NOTE — Progress Notes (Addendum)
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  Relationships   Social connections:    Talks on phone: Not on file    Gets together: Not on file    Attends religious service: Not on file    Active member of club or organization: Not on file    Attends meetings of clubs or organizations: Not on file    Relationship status: Not on file   Intimate partner violence:    Fear of current or ex partner: Not on file    Emotionally abused: Not on file    Physically abused: Not on file    Forced sexual activity: Not on file  Other Topics Concern   Not on file  Social History Narrative   Not on file      Review of Systems  All other systems reviewed and are negative.      Objective:   Physical Exam Vitals signs reviewed.  Constitutional:      General: He is not in acute distress.    Appearance: Normal appearance. He is normal weight. He is not ill-appearing, toxic-appearing or diaphoretic.  Cardiovascular:     Rate and Rhythm: Normal rate and regular rhythm.      Heart sounds: Normal heart sounds. No murmur. No friction rub. No gallop.   Pulmonary:     Effort: Tachypnea present. No accessory muscle usage or respiratory distress.     Breath sounds: Normal breath sounds. No stridor. No wheezing, rhonchi or rales.  Abdominal:     General: Abdomen is flat. Bowel sounds are normal.     Palpations: Abdomen is soft. There is no mass.     Tenderness: There is no abdominal tenderness. There is no guarding.     Hernia: No hernia is present.  Musculoskeletal:     Right lower leg: Edema present.     Left lower leg: Edema present.  Skin:    Findings: Lesion:    Neurological:     Mental Status: He is alert.           Assessment & Plan:  Shortness of breath - Plan: D-dimer, quantitative (not at [facility]), CBC with Differential/Platelet, COMPLETE METABOLIC PANEL WITH GFR  Glomerulonephritis  I spent approximately 30 minutes today with the patient and his daughter explaining his condition.  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  I explained that without his kidneys he will die.  Therefore he will be on dialysis until his kidney function hopefully improves.  I explained that the reason he is taking high-dose prednisone as well as the Rituxan is to suppress his immune system with the hopes that the kidney function will recover.  Unfortunately the prednisone will cause hypoglycemia.  Therefore of asked him to check his blood sugar before meals and before bedtime.  I wrote the patient a sliding scale direction.  He will give 0 units sugars less than 150, 2 units from 1 51-200, 4 units from 201 to 250, 6 units from 2 51-300, 8 units from 301-400, and 10 units greater than 400.  We will recheck via telephone and calculate his total insulin requirement and then make adjustments in 1 week.  He will follow-up as planned with nephrology.  Given his shortness of breath I will check a d-dimer today and if  elevated  will send the patient for a VQ scan to evaluate for pulmonary embolism given his prolonged immobilization and tachypnea and shortness of breath.  Addendum from 10/25/2018  Patient's d-dimer returned positive at greater than 4.  However this could possibly be due to his underlying posse immune glomerulonephritis.  Initially I wanted to get a VQ scan to evaluate for pulmonary embolism.  This was ordered stat this morning.  However according to radiology because of the COVID-19 restrictions they are not allowed to perform the ventilation portion of the VQ scan making this test and effective in evaluating for pulmonary embolism.  My pretest probability is intermediate and I am unwilling to risk his renal function with a CT angiogram of the chest.  Therefore I made the decision to obtain stat ultrasounds of his lower extremities to evaluate for any evidence of DVT.  My rationale is that if the patient has a pulmonary embolism it would come from a DVT in his legs and we should see residual clot burden in his legs.  This would not affect his kidney function and we can get this test ordered stat.  I will also perform a chest x-ray to evaluate for any evidence of pneumonia or underlying pulmonary issues.  I suspect that his dyspnea is multifactorial due to his anemia, underlying cardiovascular disease.  Although not ideal, I feel that this is the safest way to rule out pulmonary embolism in an intermediate risk patient if the patient develops chest pain however I would insist on a CT angiogram

## 2018-10-25 ENCOUNTER — Other Ambulatory Visit: Payer: Self-pay | Admitting: Family Medicine

## 2018-10-25 ENCOUNTER — Ambulatory Visit (HOSPITAL_COMMUNITY)
Admission: RE | Admit: 2018-10-25 | Payer: Medicare HMO | Source: Ambulatory Visit | Attending: Family Medicine | Admitting: Family Medicine

## 2018-10-25 ENCOUNTER — Telehealth: Payer: Self-pay | Admitting: Internal Medicine

## 2018-10-25 ENCOUNTER — Telehealth: Payer: Self-pay | Admitting: *Deleted

## 2018-10-25 DIAGNOSIS — R0602 Shortness of breath: Secondary | ICD-10-CM

## 2018-10-25 DIAGNOSIS — R7989 Other specified abnormal findings of blood chemistry: Secondary | ICD-10-CM

## 2018-10-25 DIAGNOSIS — R0600 Dyspnea, unspecified: Secondary | ICD-10-CM

## 2018-10-25 DIAGNOSIS — N179 Acute kidney failure, unspecified: Secondary | ICD-10-CM | POA: Diagnosis not present

## 2018-10-25 LAB — COMPLETE METABOLIC PANEL WITH GFR
AG Ratio: 0.7 (calc) — ABNORMAL LOW (ref 1.0–2.5)
ALT: 19 U/L (ref 9–46)
AST: 45 U/L — ABNORMAL HIGH (ref 10–35)
Albumin: 2.3 g/dL — ABNORMAL LOW (ref 3.6–5.1)
Alkaline phosphatase (APISO): 42 U/L (ref 35–144)
BUN/Creatinine Ratio: 14 (calc) (ref 6–22)
BUN: 67 mg/dL — ABNORMAL HIGH (ref 7–25)
CO2: 29 mmol/L (ref 20–32)
Calcium: 7.8 mg/dL — ABNORMAL LOW (ref 8.6–10.3)
Chloride: 102 mmol/L (ref 98–110)
Creat: 4.73 mg/dL — ABNORMAL HIGH (ref 0.70–1.18)
GFR, Est African American: 13 mL/min/{1.73_m2} — ABNORMAL LOW (ref >=60)
GFR, Est Non African American: 11 mL/min/{1.73_m2} — ABNORMAL LOW (ref >=60)
Globulin: 3.2 g/dL (calc) (ref 1.9–3.7)
Glucose, Bld: 95 mg/dL (ref 65–99)
Potassium: 4.1 mmol/L (ref 3.5–5.3)
Sodium: 138 mmol/L (ref 135–146)
Total Bilirubin: 0.9 mg/dL (ref 0.2–1.2)
Total Protein: 5.5 g/dL — ABNORMAL LOW (ref 6.1–8.1)

## 2018-10-25 LAB — CBC WITH DIFFERENTIAL/PLATELET
Absolute Monocytes: 197 cells/uL — ABNORMAL LOW (ref 200–950)
Basophils Absolute: 0 cells/uL (ref 0–200)
Basophils Relative: 0 %
Eosinophils Absolute: 0 cells/uL — ABNORMAL LOW (ref 15–500)
Eosinophils Relative: 0 %
HCT: 24.2 % — ABNORMAL LOW (ref 38.5–50.0)
Hemoglobin: 8.2 g/dL — ABNORMAL LOW (ref 13.2–17.1)
Lymphs Abs: 466 cells/uL — ABNORMAL LOW (ref 850–3900)
MCH: 30.4 pg (ref 27.0–33.0)
MCHC: 33.9 g/dL (ref 32.0–36.0)
MCV: 89.6 fL (ref 80.0–100.0)
MPV: 10.7 fL (ref 7.5–12.5)
Monocytes Relative: 4.7 %
Neutro Abs: 3536 cells/uL (ref 1500–7800)
Neutrophils Relative %: 84.2 %
Platelets: 116 10*3/uL — ABNORMAL LOW (ref 140–400)
RBC: 2.7 10*6/uL — ABNORMAL LOW (ref 4.20–5.80)
RDW: 21.5 % — ABNORMAL HIGH (ref 11.0–15.0)
Total Lymphocyte: 11.1 %
WBC: 4.2 10*3/uL (ref 3.8–10.8)

## 2018-10-25 LAB — D-DIMER, QUANTITATIVE: D-Dimer, Quant: 4.4 mcg/mL FEU — ABNORMAL HIGH (ref <0.50)

## 2018-10-25 LAB — CBC MORPHOLOGY

## 2018-10-25 NOTE — Telephone Encounter (Signed)
Pt's vascular study scheduled for 3:00 today Shannon from [employer] will notify pt ./cy

## 2018-10-25 NOTE — Telephone Encounter (Signed)
Patient in [place] @ 5 PM for  VENOUS  Doppler .   REVIEWD SCHEDULE APPOINTMENT WAS SCHEDULE FOR 3 PM TODAY.  PATIENT STATESSHE HAD DIALYSIS TODAY AND IT DID NOT FINISH UNTIL 3 PM  "I TOLD THEM WHEN THEY SCHEDULE IT"    CONTACTED  SUPERVISOR -EXPLAINED SITUATION  AN APPOINTMENT RESCHEDULE FOR TOMORROW 10/26/18  AT 11 AM - PATIENT AWARE VERBALIZED UNDERSTANDING.

## 2018-10-25 NOTE — Telephone Encounter (Signed)
New Message     [name] is calling and needs to schedule a stat vascular test    Please call

## 2018-10-26 ENCOUNTER — Ambulatory Visit (HOSPITAL_BASED_OUTPATIENT_CLINIC_OR_DEPARTMENT_OTHER)
Admission: RE | Admit: 2018-10-26 | Discharge: 2018-10-26 | Disposition: A | Discharge status: Home / Self Care | Payer: Medicare HMO | Source: Ambulatory Visit | Attending: Cardiology | Admitting: Cardiology

## 2018-10-26 ENCOUNTER — Ambulatory Visit (HOSPITAL_COMMUNITY)
Admission: RE | Admit: 2018-10-26 | Discharge: 2018-10-26 | Disposition: A | Discharge status: Home / Self Care | Payer: Medicare HMO | Source: Ambulatory Visit | Attending: Family Medicine | Admitting: Family Medicine

## 2018-10-26 ENCOUNTER — Encounter (HOSPITAL_COMMUNITY): Payer: Self-pay

## 2018-10-26 ENCOUNTER — Encounter (HOSPITAL_COMMUNITY): Payer: Self-pay | Admitting: *Deleted

## 2018-10-26 ENCOUNTER — Other Ambulatory Visit: Payer: Self-pay

## 2018-10-26 ENCOUNTER — Observation Stay (HOSPITAL_COMMUNITY)
Admission: EM | Admit: 2018-10-26 | Discharge: 2018-10-27 | Disposition: A | Discharge status: Home / Self Care | Payer: Medicare HMO | Attending: Internal Medicine | Admitting: Internal Medicine

## 2018-10-26 DIAGNOSIS — I824Y1 Acute embolism and thrombosis of unspecified deep veins of right proximal lower extremity: Secondary | ICD-10-CM | POA: Diagnosis not present

## 2018-10-26 DIAGNOSIS — E119 Type 2 diabetes mellitus without complications: Secondary | ICD-10-CM | POA: Diagnosis present

## 2018-10-26 DIAGNOSIS — R778 Other specified abnormalities of plasma proteins: Secondary | ICD-10-CM | POA: Diagnosis present

## 2018-10-26 DIAGNOSIS — R7989 Other specified abnormal findings of blood chemistry: Secondary | ICD-10-CM

## 2018-10-26 DIAGNOSIS — E1122 Type 2 diabetes mellitus with diabetic chronic kidney disease: Secondary | ICD-10-CM | POA: Diagnosis not present

## 2018-10-26 DIAGNOSIS — Z8551 Personal history of malignant neoplasm of bladder: Secondary | ICD-10-CM | POA: Diagnosis not present

## 2018-10-26 DIAGNOSIS — I82409 Acute embolism and thrombosis of unspecified deep veins of unspecified lower extremity: Principal | ICD-10-CM | POA: Diagnosis present

## 2018-10-26 DIAGNOSIS — J418 Mixed simple and mucopurulent chronic bronchitis: Secondary | ICD-10-CM

## 2018-10-26 DIAGNOSIS — Z20828 Contact with and (suspected) exposure to other viral communicable diseases: Secondary | ICD-10-CM | POA: Diagnosis not present

## 2018-10-26 DIAGNOSIS — E785 Hyperlipidemia, unspecified: Secondary | ICD-10-CM | POA: Diagnosis not present

## 2018-10-26 DIAGNOSIS — R0602 Shortness of breath: Secondary | ICD-10-CM | POA: Insufficient documentation

## 2018-10-26 DIAGNOSIS — I82501 Chronic embolism and thrombosis of unspecified deep veins of right lower extremity: Secondary | ICD-10-CM | POA: Diagnosis not present

## 2018-10-26 DIAGNOSIS — F17200 Nicotine dependence, unspecified, uncomplicated: Secondary | ICD-10-CM | POA: Diagnosis not present

## 2018-10-26 DIAGNOSIS — E1129 Type 2 diabetes mellitus with other diabetic kidney complication: Secondary | ICD-10-CM | POA: Diagnosis present

## 2018-10-26 DIAGNOSIS — I10 Essential (primary) hypertension: Secondary | ICD-10-CM

## 2018-10-26 DIAGNOSIS — I82511 Chronic embolism and thrombosis of right femoral vein: Secondary | ICD-10-CM | POA: Diagnosis not present

## 2018-10-26 DIAGNOSIS — J449 Chronic obstructive pulmonary disease, unspecified: Secondary | ICD-10-CM | POA: Diagnosis present

## 2018-10-26 DIAGNOSIS — R69 Illness, unspecified: Secondary | ICD-10-CM

## 2018-10-26 DIAGNOSIS — K219 Gastro-esophageal reflux disease without esophagitis: Secondary | ICD-10-CM

## 2018-10-26 DIAGNOSIS — I120 Hypertensive chronic kidney disease with stage 5 chronic kidney disease or end stage renal disease: Secondary | ICD-10-CM | POA: Diagnosis not present

## 2018-10-26 DIAGNOSIS — D61818 Other pancytopenia: Secondary | ICD-10-CM | POA: Diagnosis not present

## 2018-10-26 DIAGNOSIS — Z79899 Other long term (current) drug therapy: Secondary | ICD-10-CM | POA: Diagnosis not present

## 2018-10-26 DIAGNOSIS — D631 Anemia in chronic kidney disease: Secondary | ICD-10-CM | POA: Diagnosis not present

## 2018-10-26 DIAGNOSIS — Z992 Dependence on renal dialysis: Secondary | ICD-10-CM

## 2018-10-26 DIAGNOSIS — Z794 Long term (current) use of insulin: Secondary | ICD-10-CM | POA: Insufficient documentation

## 2018-10-26 DIAGNOSIS — I2510 Atherosclerotic heart disease of native coronary artery without angina pectoris: Secondary | ICD-10-CM | POA: Diagnosis present

## 2018-10-26 DIAGNOSIS — I82531 Chronic embolism and thrombosis of right popliteal vein: Secondary | ICD-10-CM | POA: Diagnosis not present

## 2018-10-26 DIAGNOSIS — R0600 Dyspnea, unspecified: Secondary | ICD-10-CM

## 2018-10-26 DIAGNOSIS — N2581 Secondary hyperparathyroidism of renal origin: Secondary | ICD-10-CM | POA: Diagnosis not present

## 2018-10-26 DIAGNOSIS — I825Y1 Chronic embolism and thrombosis of unspecified deep veins of right proximal lower extremity: Secondary | ICD-10-CM

## 2018-10-26 DIAGNOSIS — N186 End stage renal disease: Secondary | ICD-10-CM

## 2018-10-26 DIAGNOSIS — R748 Abnormal levels of other serum enzymes: Secondary | ICD-10-CM | POA: Diagnosis not present

## 2018-10-26 DIAGNOSIS — F418 Other specified anxiety disorders: Secondary | ICD-10-CM | POA: Diagnosis not present

## 2018-10-26 HISTORY — DX: Other pancytopenia: D61.818

## 2018-10-26 HISTORY — DX: Gastro-esophageal reflux disease without esophagitis: K21.9

## 2018-10-26 HISTORY — DX: Chronic kidney disease, unspecified: N18.9

## 2018-10-26 LAB — COMPREHENSIVE METABOLIC PANEL
ALT: 27 U/L (ref 0–44)
AST: 46 U/L — ABNORMAL HIGH (ref 15–41)
Albumin: 2 g/dL — ABNORMAL LOW (ref 3.5–5.0)
Alkaline Phosphatase: 42 U/L (ref 38–126)
Anion gap: 10 (ref 5–15)
BUN: 61 mg/dL — ABNORMAL HIGH (ref 8–23)
CO2: 23 mmol/L (ref 22–32)
Calcium: 7.6 mg/dL — ABNORMAL LOW (ref 8.9–10.3)
Chloride: 105 mmol/L (ref 98–111)
Creatinine, Ser: 4.57 mg/dL — ABNORMAL HIGH (ref 0.61–1.24)
GFR calc Af Amer: 14 mL/min — ABNORMAL LOW (ref >60)
GFR calc non Af Amer: 12 mL/min — ABNORMAL LOW (ref >60)
Glucose, Bld: 118 mg/dL — ABNORMAL HIGH (ref 70–99)
Potassium: 4.3 mmol/L (ref 3.5–5.1)
Sodium: 138 mmol/L (ref 135–145)
Total Bilirubin: 0.9 mg/dL (ref 0.3–1.2)
Total Protein: 5.3 g/dL — ABNORMAL LOW (ref 6.5–8.1)

## 2018-10-26 LAB — CBC WITH DIFFERENTIAL/PLATELET
Abs Immature Granulocytes: 0.01 10*3/uL (ref 0.00–0.07)
Basophils Absolute: 0 10*3/uL (ref 0.0–0.1)
Basophils Relative: 0 %
Eosinophils Absolute: 0 10*3/uL (ref 0.0–0.5)
Eosinophils Relative: 0 %
HCT: 24.5 % — ABNORMAL LOW (ref 39.0–52.0)
Hemoglobin: 7.9 g/dL — ABNORMAL LOW (ref 13.0–17.0)
Immature Granulocytes: 1 %
Lymphocytes Relative: 7 %
Lymphs Abs: 0.2 10*3/uL — ABNORMAL LOW (ref 0.7–4.0)
MCH: 30.5 pg (ref 26.0–34.0)
MCHC: 32.2 g/dL (ref 30.0–36.0)
MCV: 94.6 fL (ref 80.0–100.0)
Monocytes Absolute: 0.1 10*3/uL (ref 0.1–1.0)
Monocytes Relative: 5 %
Neutro Abs: 1.9 10*3/uL (ref 1.7–7.7)
Neutrophils Relative %: 87 %
Platelets: 89 10*3/uL — ABNORMAL LOW (ref 150–400)
RBC: 2.59 MIL/uL — ABNORMAL LOW (ref 4.22–5.81)
RDW: 24.3 % — ABNORMAL HIGH (ref 11.5–15.5)
WBC: 2.2 10*3/uL — ABNORMAL LOW (ref 4.0–10.5)
nRBC: 0 % (ref 0.0–0.2)

## 2018-10-26 LAB — URINALYSIS, ROUTINE W REFLEX MICROSCOPIC
Bilirubin Urine: NEGATIVE
Glucose, UA: 50 mg/dL — AB
Ketones, ur: NEGATIVE mg/dL
Nitrite: NEGATIVE
Protein, ur: 100 mg/dL — AB
RBC / HPF: >50 RBC/hpf — ABNORMAL HIGH (ref 0–5)
Specific Gravity, Urine: 1.012 (ref 1.005–1.030)
pH: 5 (ref 5.0–8.0)

## 2018-10-26 LAB — SARS CORONAVIRUS 2 BY RT PCR (HOSPITAL ORDER, PERFORMED IN ~~LOC~~ HOSPITAL LAB): SARS Coronavirus 2: NEGATIVE

## 2018-10-26 LAB — MAGNESIUM: Magnesium: 1.5 mg/dL — ABNORMAL LOW (ref 1.7–2.4)

## 2018-10-26 LAB — LACTIC ACID, PLASMA: Lactic Acid, Venous: 1.3 mmol/L (ref 0.5–1.9)

## 2018-10-26 LAB — POCT I-STAT EG7
Acid-Base Excess: 2 mmol/L (ref 0.0–2.0)
Bicarbonate: 25.2 mmol/L (ref 20.0–28.0)
Calcium, Ion: 1.05 mmol/L — ABNORMAL LOW (ref 1.15–1.40)
HCT: 21 % — ABNORMAL LOW (ref 39.0–52.0)
Hemoglobin: 7.1 g/dL — ABNORMAL LOW (ref 13.0–17.0)
O2 Saturation: 97 %
Potassium: 4.3 mmol/L (ref 3.5–5.1)
Sodium: 138 mmol/L (ref 135–145)
TCO2: 26 mmol/L (ref 22–32)
pCO2, Ven: 33.8 mmHg — ABNORMAL LOW (ref 44.0–60.0)
pH, Ven: 7.479 — ABNORMAL HIGH (ref 7.250–7.430)
pO2, Ven: 86 mmHg — ABNORMAL HIGH (ref 32.0–45.0)

## 2018-10-26 LAB — GLUCOSE, CAPILLARY: Glucose-Capillary: 212 mg/dL — ABNORMAL HIGH (ref 70–99)

## 2018-10-26 LAB — TROPONIN I
Troponin I: 0.09 ng/mL (ref <0.03)
Troponin I: 0.07 ng/mL (ref <0.03)

## 2018-10-26 LAB — TYPE AND SCREEN
ABO/RH(D): A POS
Antibody Screen: NEGATIVE

## 2018-10-26 LAB — PHOSPHORUS: Phosphorus: 3.3 mg/dL (ref 2.5–4.6)

## 2018-10-26 LAB — PROTIME-INR
INR: 1.2 (ref 0.8–1.2)
Prothrombin Time: 15 seconds (ref 11.4–15.2)

## 2018-10-26 LAB — APTT: aPTT: 31 seconds (ref 24–36)

## 2018-10-26 LAB — BRAIN NATRIURETIC PEPTIDE: B Natriuretic Peptide: 859.8 pg/mL — ABNORMAL HIGH (ref 0.0–100.0)

## 2018-10-26 LAB — ABO/RH: ABO/RH(D): A POS

## 2018-10-26 MED ORDER — MAGNESIUM SULFATE 2 GM/50ML IV SOLN
2.0000 g | Freq: Once | INTRAVENOUS | Status: AC
  Administered 2018-10-26: 22:00:00 2 g via INTRAVENOUS
  Filled 2018-10-26: qty 50

## 2018-10-26 MED ORDER — DARBEPOETIN ALFA 200 MCG/0.4ML IJ SOSY
200.0000 ug | PREFILLED_SYRINGE | INTRAMUSCULAR | Status: DC
  Administered 2018-10-27: 09:00:00 200 ug via INTRAVENOUS
  Filled 2018-10-26: qty 0.4

## 2018-10-26 MED ORDER — EPINEPHRINE 0.3 MG/0.3ML IJ SOAJ: 0.3000 mg | INTRAMUSCULAR | Status: DC | PRN

## 2018-10-26 MED ORDER — OXYCODONE-ACETAMINOPHEN 5-325 MG PO TABS
1.0000 | ORAL_TABLET | ORAL | Status: DC | PRN
  Administered 2018-10-26 – 2018-10-27 (×2): 1 via ORAL
  Filled 2018-10-26: qty 1

## 2018-10-26 MED ORDER — METOPROLOL SUCCINATE ER 25 MG PO TB24: 25.0000 mg | ORAL_TABLET | Freq: Every day | ORAL | Status: DC

## 2018-10-26 MED ORDER — FENOFIBRATE 160 MG PO TABS: 160.0000 mg | ORAL_TABLET | Freq: Every day | ORAL | Status: DC

## 2018-10-26 MED ORDER — ATORVASTATIN CALCIUM 80 MG PO TABS
80.0000 mg | ORAL_TABLET | Freq: Every day | ORAL | Status: DC
  Administered 2018-10-27: 08:00:00 80 mg via ORAL
  Filled 2018-10-26: qty 1

## 2018-10-26 MED ORDER — OXYCODONE-ACETAMINOPHEN 10-325 MG PO TABS: 1.0000 | ORAL_TABLET | ORAL | Status: DC | PRN

## 2018-10-26 MED ORDER — ACETAMINOPHEN 650 MG RE SUPP: 650.0000 mg | Freq: Four times a day (QID) | RECTAL | Status: DC | PRN

## 2018-10-26 MED ORDER — INSULIN ASPART 100 UNIT/ML ~~LOC~~ SOLN: 0.0000 [IU] | Freq: Three times a day (TID) | SUBCUTANEOUS | Status: DC

## 2018-10-26 MED ORDER — CHLORHEXIDINE GLUCONATE CLOTH 2 % EX PADS: 6.0000 | MEDICATED_PAD | Freq: Every day | CUTANEOUS | Status: DC

## 2018-10-26 MED ORDER — MOMETASONE FURO-FORMOTEROL FUM 200-5 MCG/ACT IN AERO
2.0000 | INHALATION_SPRAY | Freq: Two times a day (BID) | RESPIRATORY_TRACT | Status: DC
  Filled 2018-10-26: qty 8.8

## 2018-10-26 MED ORDER — ONDANSETRON HCL 4 MG PO TABS: 4.0000 mg | ORAL_TABLET | Freq: Four times a day (QID) | ORAL | Status: DC | PRN

## 2018-10-26 MED ORDER — OXYCODONE HCL 5 MG PO TABS
5.0000 mg | ORAL_TABLET | ORAL | Status: DC | PRN
  Administered 2018-10-26: 23:00:00 5 mg via ORAL
  Filled 2018-10-26: qty 1

## 2018-10-26 MED ORDER — CALCITRIOL 0.5 MCG PO CAPS
0.5000 ug | ORAL_CAPSULE | ORAL | Status: DC
  Administered 2018-10-27: 09:00:00 0.5 ug via ORAL

## 2018-10-26 MED ORDER — CLOPIDOGREL BISULFATE 75 MG PO TABS
75.0000 mg | ORAL_TABLET | Freq: Every day | ORAL | Status: DC
  Administered 2018-10-27: 75 mg via ORAL
  Filled 2018-10-26: qty 1

## 2018-10-26 MED ORDER — NICOTINE 21 MG/24HR TD PT24
21.0000 mg | MEDICATED_PATCH | Freq: Every day | TRANSDERMAL | Status: DC
  Filled 2018-10-26: qty 1

## 2018-10-26 MED ORDER — ALBUTEROL SULFATE (2.5 MG/3ML) 0.083% IN NEBU: 3.0000 mL | INHALATION_SOLUTION | RESPIRATORY_TRACT | Status: DC | PRN

## 2018-10-26 MED ORDER — HYDROCORTISONE NA SUCCINATE PF 100 MG IJ SOLR
50.0000 mg | Freq: Once | INTRAMUSCULAR | Status: AC
  Administered 2018-10-26: 23:00:00 50 mg via INTRAVENOUS
  Filled 2018-10-26: qty 2

## 2018-10-26 MED ORDER — ONDANSETRON HCL 4 MG/2ML IJ SOLN: 4.0000 mg | Freq: Four times a day (QID) | INTRAMUSCULAR | Status: DC | PRN

## 2018-10-26 MED ORDER — ACETAMINOPHEN 325 MG PO TABS: 650.0000 mg | ORAL_TABLET | Freq: Four times a day (QID) | ORAL | Status: DC | PRN

## 2018-10-26 MED ORDER — OXYCODONE-ACETAMINOPHEN 5-325 MG PO TABS
2.0000 | ORAL_TABLET | Freq: Once | ORAL | Status: AC
  Administered 2018-10-26: 17:00:00 2 via ORAL
  Filled 2018-10-26: qty 2

## 2018-10-26 MED ORDER — HYDRALAZINE HCL 20 MG/ML IJ SOLN: 5.0000 mg | INTRAMUSCULAR | Status: DC | PRN

## 2018-10-26 MED ORDER — INSULIN ASPART 100 UNIT/ML ~~LOC~~ SOLN
0.0000 [IU] | Freq: Every day | SUBCUTANEOUS | Status: DC
  Administered 2018-10-26: 23:00:00 2 [IU] via SUBCUTANEOUS

## 2018-10-26 MED ORDER — DOCUSATE SODIUM 100 MG PO CAPS
100.0000 mg | ORAL_CAPSULE | Freq: Every day | ORAL | Status: DC
  Administered 2018-10-27: 08:00:00 100 mg via ORAL
  Filled 2018-10-26: qty 1

## 2018-10-26 MED ORDER — SULFAMETHOXAZOLE-TRIMETHOPRIM 400-80 MG PO TABS: 1.0000 | ORAL_TABLET | ORAL | Status: DC

## 2018-10-26 MED ORDER — PREDNISONE 20 MG PO TABS
60.0000 mg | ORAL_TABLET | Freq: Every day | ORAL | Status: DC
  Administered 2018-10-27: 60 mg via ORAL
  Filled 2018-10-26: qty 3

## 2018-10-26 MED ORDER — DOXAZOSIN MESYLATE 2 MG PO TABS
2.0000 mg | ORAL_TABLET | Freq: Every day | ORAL | Status: DC
  Filled 2018-10-26 (×2): qty 1

## 2018-10-26 MED ORDER — SODIUM CHLORIDE 0.9 % IV SOLN
0.0600 mg/kg/h | INTRAVENOUS | Status: DC
  Administered 2018-10-26: 0.05 mg/kg/h via INTRAVENOUS
  Filled 2018-10-26: qty 250

## 2018-10-26 MED ORDER — PANTOPRAZOLE SODIUM 40 MG PO TBEC
40.0000 mg | DELAYED_RELEASE_TABLET | Freq: Every day | ORAL | Status: DC
  Administered 2018-10-26 – 2018-10-27 (×2): 40 mg via ORAL
  Filled 2018-10-26 (×2): qty 1

## 2018-10-26 MED ORDER — CALCIUM ACETATE (PHOS BINDER) 667 MG PO CAPS
667.0000 mg | ORAL_CAPSULE | Freq: Three times a day (TID) | ORAL | Status: DC
  Administered 2018-10-27 (×2): 667 mg via ORAL
  Filled 2018-10-26 (×2): qty 1
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
## 2018-10-26 NOTE — ED Triage Notes (Signed)
Pt gets HD with last tx Tues, pt reports getting chemo tx last Thursday for kidney cancer

## 2018-10-26 NOTE — ED Triage Notes (Addendum)
Pt here for tx for blood clot in R leg, pt reports having CT scan completed today, pt denies SOB, A&O x, pt reports having study already today that confirmed the clot, pt reports having chest xray completed today as well, pt denies CP, pt takes Plavix

## 2018-10-26 NOTE — ED Notes (Signed)
Spoke with charge nurse on [place] who reports speaking with [facility]: Lambert Rhonda W and Patient Placement. States they are unable to receive patient at this time. ED charge nurse informed.
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
## 2018-10-26 NOTE — ED Notes (Signed)
Nurse navigator spoke with patient along with step daughter [name] [phone] regarding plan of care. Patient has dialysis on T/Th/Sat and suppose to have dialysis tomorrow per patient and step daughter.

## 2018-10-26 NOTE — ED Notes (Signed)
Attempted report to floor.  

## 2018-10-26 NOTE — Progress Notes (Signed)
Bilateral lower venous has been completed and is positive for chronic DVT in Right proximal femoral to popiteal vein and NO DVT in Left lower extremity. Preliminary results can be found under CV proc through chart review.   Cira Servant, RVT [facility]

## 2018-10-26 NOTE — ED Notes (Signed)
Attempted to give report, no RN available on floor.
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
## 2018-10-27 ENCOUNTER — Encounter (HOSPITAL_COMMUNITY): Payer: Self-pay | Admitting: Internal Medicine

## 2018-10-27 DIAGNOSIS — K219 Gastro-esophageal reflux disease without esophagitis: Secondary | ICD-10-CM | POA: Diagnosis not present

## 2018-10-27 DIAGNOSIS — N186 End stage renal disease: Secondary | ICD-10-CM | POA: Diagnosis not present

## 2018-10-27 DIAGNOSIS — I2510 Atherosclerotic heart disease of native coronary artery without angina pectoris: Secondary | ICD-10-CM | POA: Diagnosis not present

## 2018-10-27 DIAGNOSIS — I10 Essential (primary) hypertension: Secondary | ICD-10-CM | POA: Diagnosis not present

## 2018-10-27 DIAGNOSIS — E785 Hyperlipidemia, unspecified: Secondary | ICD-10-CM | POA: Diagnosis not present

## 2018-10-27 DIAGNOSIS — E46 Unspecified protein-calorie malnutrition: Secondary | ICD-10-CM | POA: Insufficient documentation

## 2018-10-27 DIAGNOSIS — I120 Hypertensive chronic kidney disease with stage 5 chronic kidney disease or end stage renal disease: Secondary | ICD-10-CM | POA: Diagnosis not present

## 2018-10-27 DIAGNOSIS — I82401 Acute embolism and thrombosis of unspecified deep veins of right lower extremity: Secondary | ICD-10-CM

## 2018-10-27 DIAGNOSIS — I82409 Acute embolism and thrombosis of unspecified deep veins of unspecified lower extremity: Secondary | ICD-10-CM | POA: Diagnosis not present

## 2018-10-27 DIAGNOSIS — J431 Panlobular emphysema: Secondary | ICD-10-CM | POA: Diagnosis not present

## 2018-10-27 DIAGNOSIS — D631 Anemia in chronic kidney disease: Secondary | ICD-10-CM | POA: Diagnosis not present

## 2018-10-27 DIAGNOSIS — N2581 Secondary hyperparathyroidism of renal origin: Secondary | ICD-10-CM | POA: Diagnosis not present

## 2018-10-27 DIAGNOSIS — Z20828 Contact with and (suspected) exposure to other viral communicable diseases: Secondary | ICD-10-CM | POA: Diagnosis not present

## 2018-10-27 DIAGNOSIS — J449 Chronic obstructive pulmonary disease, unspecified: Secondary | ICD-10-CM | POA: Diagnosis not present

## 2018-10-27 DIAGNOSIS — D509 Iron deficiency anemia, unspecified: Secondary | ICD-10-CM | POA: Insufficient documentation

## 2018-10-27 DIAGNOSIS — Z992 Dependence on renal dialysis: Secondary | ICD-10-CM | POA: Diagnosis not present

## 2018-10-27 DIAGNOSIS — R0602 Shortness of breath: Secondary | ICD-10-CM | POA: Diagnosis not present

## 2018-10-27 DIAGNOSIS — D61818 Other pancytopenia: Secondary | ICD-10-CM | POA: Diagnosis not present

## 2018-10-27 LAB — LIPID PANEL
Cholesterol: 105 mg/dL (ref 0–200)
HDL: 37 mg/dL — ABNORMAL LOW (ref >40)
LDL Cholesterol: 57 mg/dL (ref 0–99)
Total CHOL/HDL Ratio: 2.8 RATIO
Triglycerides: 53 mg/dL (ref <150)
VLDL: 11 mg/dL (ref 0–40)

## 2018-10-27 LAB — BASIC METABOLIC PANEL
Anion gap: 13 (ref 5–15)
BUN: 75 mg/dL — ABNORMAL HIGH (ref 8–23)
CO2: 24 mmol/L (ref 22–32)
Calcium: 8.1 mg/dL — ABNORMAL LOW (ref 8.9–10.3)
Chloride: 101 mmol/L (ref 98–111)
Creatinine, Ser: 5.1 mg/dL — ABNORMAL HIGH (ref 0.61–1.24)
GFR calc Af Amer: 12 mL/min — ABNORMAL LOW (ref >60)
GFR calc non Af Amer: 10 mL/min — ABNORMAL LOW (ref >60)
Glucose, Bld: 108 mg/dL — ABNORMAL HIGH (ref 70–99)
Potassium: 4.8 mmol/L (ref 3.5–5.1)
Sodium: 138 mmol/L (ref 135–145)

## 2018-10-27 LAB — GLUCOSE, CAPILLARY
Glucose-Capillary: 104 mg/dL — ABNORMAL HIGH (ref 70–99)
Glucose-Capillary: 157 mg/dL — ABNORMAL HIGH (ref 70–99)
Glucose-Capillary: 125 mg/dL — ABNORMAL HIGH (ref 70–99)

## 2018-10-27 LAB — HEMOGLOBIN A1C
Hgb A1c MFr Bld: 5 % (ref 4.8–5.6)
Mean Plasma Glucose: 96.8 mg/dL

## 2018-10-27 LAB — CBC
HCT: 26.6 % — ABNORMAL LOW (ref 39.0–52.0)
Hemoglobin: 8.3 g/dL — ABNORMAL LOW (ref 13.0–17.0)
MCH: 28.9 pg (ref 26.0–34.0)
MCHC: 31.2 g/dL (ref 30.0–36.0)
MCV: 92.7 fL (ref 80.0–100.0)
Platelets: 90 10*3/uL — ABNORMAL LOW (ref 150–400)
RBC: 2.87 MIL/uL — ABNORMAL LOW (ref 4.22–5.81)
RDW: 23.5 % — ABNORMAL HIGH (ref 11.5–15.5)
WBC: 2.4 10*3/uL — ABNORMAL LOW (ref 4.0–10.5)
nRBC: 0 % (ref 0.0–0.2)

## 2018-10-27 LAB — APTT
aPTT: 53 seconds — ABNORMAL HIGH (ref 24–36)
aPTT: 70 seconds — ABNORMAL HIGH (ref 24–36)
aPTT: 72 seconds — ABNORMAL HIGH (ref 24–36)

## 2018-10-27 LAB — PHOSPHORUS: Phosphorus: 6.3 mg/dL — ABNORMAL HIGH (ref 2.5–4.6)

## 2018-10-27 LAB — TROPONIN I
Troponin I: 0.07 ng/mL (ref <0.03)
Troponin I: 0.06 ng/mL (ref <0.03)

## 2018-10-27 MED ORDER — ASPIRIN 81 MG PO TBEC: 81.0000 mg | DELAYED_RELEASE_TABLET | Freq: Every day | ORAL | Status: DC

## 2018-10-27 MED ORDER — APIXABAN 5 MG PO TABS
5.0000 mg | ORAL_TABLET | Freq: Two times a day (BID) | ORAL | Status: DC
  Administered 2018-10-27: 15:00:00 5 mg via ORAL
  Filled 2018-10-27: qty 1

## 2018-10-27 MED ORDER — OXYCODONE-ACETAMINOPHEN 5-325 MG PO TABS
ORAL_TABLET | ORAL | Status: AC
  Administered 2018-10-27: 1 via ORAL
  Filled 2018-10-27: qty 1

## 2018-10-27 MED ORDER — SODIUM CHLORIDE 0.9 % IV SOLN: 100.0000 mL | INTRAVENOUS | Status: DC | PRN

## 2018-10-27 MED ORDER — HEPARIN SODIUM (PORCINE) 1000 UNIT/ML DIALYSIS: 1000.0000 [IU] | INTRAMUSCULAR | Status: DC | PRN

## 2018-10-27 MED ORDER — ANTICOAGULANT SODIUM CITRATE 4% (200MG/5ML) IV SOLN
5.0000 mL | Status: DC | PRN
  Administered 2018-10-27: 3800 mL via INTRAVENOUS_CENTRAL
  Filled 2018-10-27: qty 3800

## 2018-10-27 MED ORDER — ALTEPLASE 2 MG IJ SOLR: 2.0000 mg | Freq: Once | INTRAMUSCULAR | Status: DC | PRN

## 2018-10-27 MED ORDER — APIXABAN 5 MG PO TABS: 5.0000 mg | ORAL_TABLET | Freq: Two times a day (BID) | ORAL | 1 refills | Status: DC

## 2018-10-27 MED ORDER — CALCITRIOL 0.5 MCG PO CAPS
ORAL_CAPSULE | ORAL | Status: AC
  Administered 2018-10-27: 0.5 ug via ORAL
  Filled 2018-10-27: qty 1

## 2018-10-27 MED ORDER — PENTAFLUOROPROP-TETRAFLUOROETH EX AERO: 1.0000 "application " | INHALATION_SPRAY | CUTANEOUS | Status: DC | PRN

## 2018-10-27 MED ORDER — ASPIRIN EC 81 MG PO TBEC: 81.0000 mg | DELAYED_RELEASE_TABLET | Freq: Every day | ORAL | Status: DC

## 2018-10-27 MED ORDER — LIDOCAINE-PRILOCAINE 2.5-2.5 % EX CREA
1.0000 "application " | TOPICAL_CREAM | CUTANEOUS | Status: DC | PRN
  Filled 2018-10-27: qty 5

## 2018-10-27 MED ORDER — LIDOCAINE HCL (PF) 1 % IJ SOLN: 5.0000 mL | INTRAMUSCULAR | Status: DC | PRN

## 2018-10-27 MED ORDER — HEPARIN SODIUM (PORCINE) 1000 UNIT/ML DIALYSIS: 20.0000 [IU]/kg | INTRAMUSCULAR | Status: DC | PRN

## 2018-10-27 MED ORDER — DARBEPOETIN ALFA 200 MCG/0.4ML IJ SOSY
PREFILLED_SYRINGE | INTRAMUSCULAR | Status: AC
  Administered 2018-10-27: 200 ug via INTRAVENOUS
  Filled 2018-10-27: qty 0.4

## 2018-10-27 NOTE — Discharge Summary (Signed)
Physician Discharge Summary  [name] Parcell Jr. MRN:[id number] DOB: 01/09/1946 DOA: 10/26/2018  PCP: Pickard, Warren T, MD  Admit date: 10/26/2018 Discharge date: 10/27/2018  Time spent: 45 minutes  Recommendations for Outpatient Follow-up:  1. Follow up with PCP 1-2 weeks for evaluation of symptoms. Recommend cbc to track Hg and platelets 2. Dialysis per schedule   Discharge Diagnoses:  Principal Problem:   DVT (deep venous thrombosis) ([name]) Active Problems:   ESRD on dialysis ([name])   Elevated troponin   Hyperlipidemia   Essential hypertension   COPD (chronic obstructive pulmonary disease) ([name])   Type II diabetes mellitus with renal manifestations ([name])   CAD (coronary artery disease)   Pancytopenia ([name])   Hypomagnesemia   TOBACCO ABUSE   GERD (gastroesophageal reflux disease)   Discharge Condition: stable  Diet recommendation: heart healthy renal carb modified  Filed Weights   10/27/18 0500 10/27/18 0835  Weight: 85 kg 82.9 kg    History of present illness:  Patient presented 4/29 with shortness of breath for more than 1 week and bilateral leg pain and swelling for more than 3 weeks.  He stated his right leg worse than the left.  Patient denied chest pain, cough, fever or chills.  He was seen by PCP 4/29, who suspected DVT and PE. He had LE doppler today showing chronic DVT. V/Q scans was not done due to COVID issues. 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  Patient denied any nausea vomiting, diarrhea or abdominal pain.  Denied dark stool or rectal bleeding.  Denied symptoms of UTI or unilateral weakness  Hospital Course:  DVT (deep venous thrombosis) ([name]): LE doppler in PCP's office showed chronic deep vein thrombosis involving the right femoral vein, and right popliteal vein. Since pt also has SOB, cannot completely rule out possibility for  PE, therefore he was started on a blood thinner.  Pt also with pancytopenia.  His baseline hemoglobin is about 8.5 at discharge Hg 8.3. Home meds include plavix prescribed by cardiology 2012.  Patient denied dark stool or rectal bleeding. Treatment options discuss with nephrology vascular and cardiology. Nephrology ok's eliquis 5mg. Vascular ok's stopping plavix from their standpoint. Cardiology stated given high risk of bleeding an antiplatelet is preferred. Recommended asa 81mg with asa and close monitoring of Hg.   Hyperlipidemia: continue lipitor and fenofibrate  Tobacco abuse: Did counseling about importance of quitting smoking  HTN: fair control. Continue home medications: Metoprolol  COPD (chronic obstructive pulmonary disease) ([name]): -dulera inhaler and prn albuterol inhaler  Type II diabetes mellitus with renal manifestations ([name]): Last A1c5.3 on 92/5/19 , well controled. Patient is taking novolog at home  CAD and  elevated troponin: : no CP, but trop slightly elevated but flat. In setting of ESRD, possibly due to decreased clearance. Ekg without acute changes.   GERD (gastroesophageal reflux disease):stable  ESRD on dialysis (TTS) due to recent Pauci immune GN:  potassium 4.3, bicarbonate 23, creatinine 4.57, BUN 61.  Dialyzed 4/30 per regular schedule. Continue prednisone 60 mg daily. Was given 50 mg of Solu-Cortef as stress dose -Continue Bactrim prophylaxis  Pancytopenia ([name]): this is a chronic issue.  Hemoglobin 8.5 at baseline. Possibly related to end-stage renal disease. aranesp was ordered by Dr. Goldsborough. Being discharged with eliquis and asa. plavix stopped. See #1.  Hypomagnesemia: Repleted  Procedures:  dialysis  Consultations:  [facility]    [facility] (phone)  [facility] (phone)  Discharge Exam: Vitals:   10/27/18 1305 10/27/18 1354  BP: 135/83 125/85  Pulse: 87 (!) 57  Resp:    Temp:  98.3 F (36.8 C)  SpO2: 96%      General: awake alert sitting on side of bed Cardiovascular: rrr no mgr trace LE edema Respiratory: normal effort BS clear bilaterally  Discharge Instructions   Discharge Instructions    Call MD for:  difficulty breathing, headache or visual disturbances   Complete by:  As directed    Call MD for:  persistant dizziness or light-headedness   Complete by:  As directed    Call MD for:  severe uncontrolled pain   Complete by:  As directed    Diet - low sodium heart healthy   Complete by:  As directed    Discharge instructions   Complete by:  As directed    Take medications as prescribed Follow up with PCP 1-2 weeks for evaluation of symptoms. Recommend cbc trend Hg and platelets   Increase activity slowly   Complete by:  As directed      Allergies as of 10/27/2018      Reactions   Gelatin Other (See Comments)   ALPHA-GAL DANGER   Meat [alpha-gal] Other (See Comments)   REACTION TO HOOVED ANIMALS PARTICULARLY RED MEAT   Pork-derived Products Other (See Comments)   ALPHA-GAL DANGER   Shellfish Allergy Shortness Of Breath   Ramipril Swelling   Codeine Nausea And Vomiting   Morphine Itching      Medication List    STOP taking these medications   clopidogrel 75 MG tablet Commonly known as:  PLAVIX     TAKE these medications   Accu-Chek Aviva Plus w/Device Kit Check FBS   apixaban 5 MG Tabs tablet Commonly known as:  ELIQUIS Take 1 tablet (5 mg total) by mouth 2 (two) times daily.   aspirin 81 MG EC tablet Take 1 tablet (81 mg total) by mouth daily. Start taking on:  Oct 28, 2018   atorvastatin 80 MG tablet Commonly known as:  LIPITOR TAKE 1 TABLET AT BEDTIME What changed:  when to take this   blood glucose meter kit and supplies Kit Dispense based on patient and insurance preference. Use up to four times daily as directed. (FOR ICD-9 250.00, 250.01).   calcium acetate 667 MG capsule Commonly known as:  PHOSLO Take 1 capsule (667 mg total) by mouth 3  (three) times daily with meals for 30 days.   docusate sodium 100 MG capsule Commonly known as:  COLACE Take 100 mg by mouth daily.   doxazosin 2 MG tablet Commonly known as:  CARDURA Take 1 tablet (2 mg total) by mouth daily for 30 days.   EPINEPHrine 0.3 mg/0.3 mL Soaj injection Commonly known as:  EPI-PEN Inject 0.3 mg into the muscle as directed.   ezetimibe 10 MG tablet Commonly known as:  ZETIA TAKE 1 TABLET BY MOUTH EVERY DAY   glucose blood test strip Commonly known as:  Accu-Chek Aviva Plus Check FBS DX: E11.9   insulin aspart 100 UNIT/ML injection Commonly known as:  novoLOG Inject 0-5 Units into the skin 3 (three) times daily with meals. CBG 181-200:1 unit,CBG 201-250:2 units.CBG 251-300:3 units.CBG 301-350:5 U   metoprolol succinate 25 MG 24 hr tablet Commonly known as:  TOPROL-XL TAKE 1 TABLET BY MOUTH EVERY DAY   oxyCODONE-acetaminophen 10-325 MG tablet Commonly known as:  PERCOCET Take 1 tablet by mouth every   4 (four) hours as needed for pain.   pantoprazole 40 MG tablet Commonly known as:  PROTONIX Take 1 tablet (40 mg total) by mouth daily for 30 days.   predniSONE 20 MG tablet Commonly known as:  DELTASONE Take 3 tablets (60 mg total) by mouth daily with breakfast for 30 days.   sulfamethoxazole-trimethoprim 400-80 MG tablet Commonly known as:  BACTRIM Take 1 tablet by mouth 3 (three) times a week for 30 days.   Symbicort 160-4.5 MCG/ACT inhaler Generic drug:  budesonide-formoterol INHALE 2 PUFFS INTO THE LUNGS TWICE A DAY      Allergies  Allergen Reactions  . Gelatin Other (See Comments)    ALPHA-GAL DANGER  . Meat [Alpha-Gal] Other (See Comments)    REACTION TO HOOVED ANIMALS PARTICULARLY RED MEAT  . Pork-Derived Products Other (See Comments)    ALPHA-GAL DANGER  . Shellfish Allergy Shortness Of Breath  . Ramipril Swelling  . Codeine Nausea And Vomiting  . Morphine Itching      The results of significant diagnostics from this  hospitalization (including imaging, microbiology, ancillary and laboratory) are listed below for reference.    Significant Diagnostic Studies: Dg Chest 1 View  Result Date: 10/17/2018 CLINICAL DATA:  72-year-old male with a history acute kidney injury EXAM: CHEST  1 VIEW COMPARISON:  No chest x-ray for comparison. Prior CT chest 03/02/2018 FINDINGS: Cardiomediastinal silhouette within normal limits. Fullness in the central vasculature. Interlobular septal thickening. Opacity at the medial aspect of the right upper lobe, appears relatively similar to the scout image from the CT dated 03/02/2018 and the scout image from thoracic spine CT 05/05/2018, likely accentuated by slight right rotation and low lung volumes. No confluent airspace disease.  No pneumothorax or pleural effusion. Right IJ tunneled hemodialysis catheter appears to terminate superior vena cava. IMPRESSION: Reticular opacities may reflect developing fibrosis, with early changes on the prior chest CT of 03/02/2018, however, mild edema could have this appearance. Right IJ tunneled hemodialysis catheter with the tip appearing to terminate superior vena cava. Electronically Signed   By: Jaime  Wagner D.O.   On: 10/17/2018 16:36   Dg Chest 2 View  Result Date: 10/26/2018 CLINICAL DATA:  Shortness of breath, swelling in legs EXAM: CHEST - 2 VIEW COMPARISON:  10/17/2018 FINDINGS: RIGHT jugular Port-A-Cath with tip projecting over SVC. Normal heart size, mediastinal contours, and pulmonary vascularity. Atherosclerotic calcifications aorta. Chronic interstitial infiltrates again identified. Calcified granuloma lower LEFT chest. No segmental consolidation, pleural effusion or pneumothorax. Bones unremarkable. IMPRESSION: Chronic interstitial infiltrates throughout both lungs, slightly greater at bases, favor chronic interstitial lung disease. No definite new or segmental infiltrate identified. Electronically Signed   By: Mark  Boles M.D.   On:  10/26/2018 15:08   Us Renal  Result Date: 10/06/2018 CLINICAL DATA:  Acute kidney injury. EXAM: RENAL / URINARY TRACT ULTRASOUND COMPLETE COMPARISON:  09/27/2018 FINDINGS: Right Kidney: Renal measurements: 11.0 x 6.6 x 6.5 cm = volume: 245 mL. Mildly increased parenchymal echogenicity. No mass or hydronephrosis visualized. Left Kidney: Renal measurements: 12.1 x 5.9 x 5.3 cm = volume: 199 mL. Borderline increased parenchymal echogenicity. Small upper pole and interpolar cysts measuring 8 mm and 11 mm, respectively. No hydronephrosis. Bladder: Appears normal for degree of bladder distention. IMPRESSION: 1. No hydronephrosis. 2. Mildly echogenic kidneys compatible with medical renal disease. Electronically Signed   By: Allen  Grady M.D.   On: 10/06/2018 11:40   Ir Fluoro Guide Cv Line Right  Result Date: 10/17/2018 CLINICAL DATA:  End-stage renal disease   and need for tunneled hemodialysis catheter. EXAM: TUNNELED CENTRAL VENOUS HEMODIALYSIS CATHETER PLACEMENT WITH ULTRASOUND AND FLUOROSCOPIC GUIDANCE ANESTHESIA/SEDATION: 1.0 mg IV Versed; 25 mcg IV Fentanyl. Total Moderate Sedation Time:   15 minutes. The patient's level of consciousness and physiologic status were continuously monitored during the procedure by Radiology nursing. MEDICATIONS: 2 g IV Ancef. FLUOROSCOPY TIME:  54 seconds.  12.9 mGy. PROCEDURE: The procedure, risks, benefits, and alternatives were explained to the patient. Questions regarding the procedure were encouraged and answered. The patient understands and consents to the procedure. A timeout was performed prior to initiating the procedure. The right neck and chest were prepped with chlorhexidine in a sterile fashion, and a sterile drape was applied covering the operative field. Maximum barrier sterile technique with sterile gowns and gloves were used for the procedure. Local anesthesia was provided with 1% lidocaine. Ultrasound was used to confirm patency of the right internal jugular  vein. After creating a small venotomy incision, a 21 gauge needle was advanced into the right internal jugular vein under direct, real-time ultrasound guidance. Ultrasound image documentation was performed. After securing guidewire access, an 8 Fr dilator was placed. A J-wire was kinked to measure appropriate catheter length. A Palindrome tunneled hemodialysis catheter measuring 23 cm from tip to cuff was chosen for placement. This was tunneled in a retrograde fashion from the chest wall to the venotomy incision. At the venotomy, serial dilatation was performed and a 16 Fr peel-away sheath was placed over a guidewire. The catheter was then placed through the sheath and the sheath removed. Final catheter positioning was confirmed and documented with a fluoroscopic spot image. The catheter was aspirated, flushed with saline, and injected with appropriate volume heparin dwells. The venotomy incision was closed with subcutaneous 3-0 Monocryl and subcuticular 4-0 Vicryl. Dermabond was applied to the incision. The catheter exit site was secured with 0-Prolene retention sutures. COMPLICATIONS: None.  No pneumothorax. FINDINGS: After catheter placement, the tip lies in the right atrium. The catheter aspirates normally and is ready for immediate use. IMPRESSION: Placement of tunneled hemodialysis catheter via the right internal jugular vein. The catheter tip lies in the right atrium. The catheter is ready for immediate use. Electronically Signed   By: Glenn  Yamagata M.D.   On: 10/17/2018 16:41   Ir Fluoro Guide Cv Line Right  Result Date: 10/07/2018 CLINICAL DATA:  Renal failure, needs venous access for hemodialysis EXAM: EXAM RIGHT IJ CATHETER PLACEMENT UNDER ULTRASOUND AND FLUOROSCOPIC GUIDANCE TECHNIQUE: The procedure, risks (including but not limited to bleeding, infection, organ damage, pneumothorax), benefits, and alternatives were explained to the patient. Questions regarding the procedure were encouraged and  answered. The patient understands and consents to the procedure. Patency of the right IJ vein was confirmed with ultrasound with image documentation. An appropriate skin site was determined. Skin site was marked. Region was prepped using maximum barrier technique including cap and mask, sterile gown, sterile gloves, large sterile sheet, and Chlorhexidine as cutaneous antisepsis. The region was infiltrated locally with 1% lidocaine. Under real-time ultrasound guidance, the right IJ vein was accessed with a 21 gauge needle; the needle tip within the vein was confirmed with ultrasound image documentation. The needle exchanged over a 018 guidewire for vascular dilator which allowed advancement of a 20 cm Mahurkar catheter. This was positioned with the tip at the cavoatrial junction. Spot chest radiograph shows good positioning and no pneumothorax. Catheter was flushed and sutured externally with 0-Prolene sutures. Patient tolerated the procedure well. FLUOROSCOPY TIME:  Less than 0.1   minute; 15 uGym2 DAP COMPLICATIONS: COMPLICATIONS none IMPRESSION: 1. Technically successful right IJ Mahurkar catheter placement. Electronically Signed   By: D  Hassell M.D.   On: 10/07/2018 13:07   Ir Us Guide Vasc Access Right  Result Date: 10/17/2018 CLINICAL DATA:  End-stage renal disease and need for tunneled hemodialysis catheter. EXAM: TUNNELED CENTRAL VENOUS HEMODIALYSIS CATHETER PLACEMENT WITH ULTRASOUND AND FLUOROSCOPIC GUIDANCE ANESTHESIA/SEDATION: 1.0 mg IV Versed; 25 mcg IV Fentanyl. Total Moderate Sedation Time:   15 minutes. The patient's level of consciousness and physiologic status were continuously monitored during the procedure by Radiology nursing. MEDICATIONS: 2 g IV Ancef. FLUOROSCOPY TIME:  54 seconds.  12.9 mGy. PROCEDURE: The procedure, risks, benefits, and alternatives were explained to the patient. Questions regarding the procedure were encouraged and answered. The patient understands and consents to the  procedure. A timeout was performed prior to initiating the procedure. The right neck and chest were prepped with chlorhexidine in a sterile fashion, and a sterile drape was applied covering the operative field. Maximum barrier sterile technique with sterile gowns and gloves were used for the procedure. Local anesthesia was provided with 1% lidocaine. Ultrasound was used to confirm patency of the right internal jugular vein. After creating a small venotomy incision, a 21 gauge needle was advanced into the right internal jugular vein under direct, real-time ultrasound guidance. Ultrasound image documentation was performed. After securing guidewire access, an 8 Fr dilator was placed. A J-wire was kinked to measure appropriate catheter length. A Palindrome tunneled hemodialysis catheter measuring 23 cm from tip to cuff was chosen for placement. This was tunneled in a retrograde fashion from the chest wall to the venotomy incision. At the venotomy, serial dilatation was performed and a 16 Fr peel-away sheath was placed over a guidewire. The catheter was then placed through the sheath and the sheath removed. Final catheter positioning was confirmed and documented with a fluoroscopic spot image. The catheter was aspirated, flushed with saline, and injected with appropriate volume heparin dwells. The venotomy incision was closed with subcutaneous 3-0 Monocryl and subcuticular 4-0 Vicryl. Dermabond was applied to the incision. The catheter exit site was secured with 0-Prolene retention sutures. COMPLICATIONS: None.  No pneumothorax. FINDINGS: After catheter placement, the tip lies in the right atrium. The catheter aspirates normally and is ready for immediate use. IMPRESSION: Placement of tunneled hemodialysis catheter via the right internal jugular vein. The catheter tip lies in the right atrium. The catheter is ready for immediate use. Electronically Signed   By: Glenn  Yamagata M.D.   On: 10/17/2018 16:41   Ir Us Guide  Vasc Access Right  Result Date: 10/07/2018 CLINICAL DATA:  Renal failure, needs venous access for hemodialysis EXAM: EXAM RIGHT IJ CATHETER PLACEMENT UNDER ULTRASOUND AND FLUOROSCOPIC GUIDANCE TECHNIQUE: The procedure, risks (including but not limited to bleeding, infection, organ damage, pneumothorax), benefits, and alternatives were explained to the patient. Questions regarding the procedure were encouraged and answered. The patient understands and consents to the procedure. Patency of the right IJ vein was confirmed with ultrasound with image documentation. An appropriate skin site was determined. Skin site was marked. Region was prepped using maximum barrier technique including cap and mask, sterile gown, sterile gloves, large sterile sheet, and Chlorhexidine as cutaneous antisepsis. The region was infiltrated locally with 1% lidocaine. Under real-time ultrasound guidance, the right IJ vein was accessed with a 21 gauge needle; the needle tip within the vein was confirmed with ultrasound image documentation. The needle exchanged over a 018 guidewire for vascular dilator   which allowed advancement of a 20 cm Mahurkar catheter. This was positioned with the tip at the cavoatrial junction. Spot chest radiograph shows good positioning and no pneumothorax. Catheter was flushed and sutured externally with 0-Prolene sutures. Patient tolerated the procedure well. FLUOROSCOPY TIME:  Less than 0.1 minute; 15 uGym2 DAP COMPLICATIONS: COMPLICATIONS none IMPRESSION: 1. Technically successful right IJ Mahurkar catheter placement. Electronically Signed   By: D  Hassell M.D.   On: 10/07/2018 13:07   Us Biopsy (kidney)  Result Date: 10/10/2018 INDICATION: 72-year-old with acute kidney injury. Request for random renal biopsy. EXAM: ULTRASOUND-GUIDED LEFT RENAL BIOPSY MEDICATIONS: None. ANESTHESIA/SEDATION: Moderate (conscious) sedation was employed during this procedure. A total of Versed 1.5 mg and Fentanyl 50 mcg was  administered intravenously. Moderate Sedation Time: 17 minutes. The patient's level of consciousness and vital signs were monitored continuously by radiology nursing throughout the procedure under my direct supervision. FLUOROSCOPY TIME:  None COMPLICATIONS: None immediate. PROCEDURE: Informed written consent was obtained from the patient after a thorough discussion of the procedural risks, benefits and alternatives. All questions were addressed. Maximal Sterile Barrier Technique was utilized including caps, mask, sterile gowns, sterile gloves, sterile drape, hand hygiene and skin antiseptic. A timeout was performed prior to the initiation of the procedure. Both kidneys were evaluated with ultrasound. The left kidney was selected for biopsy. Left flank was prepped with chlorhexidine and sterile field was created. Skin and soft tissues were anesthetized with 1% lidocaine. Using ultrasound guidance, 16 gauge core biopsy needle was directed into the left kidney lower pole. A total of 2 core biopsies were obtained. Specimens placed in saline. Bandage placed over the puncture site. FINDINGS: Negative for hydronephrosis. Core biopsies obtained from the left kidney lower pole. Negative for bleeding or hematoma formation following the core biopsies. IMPRESSION: Ultrasound-guided core biopsies of the left kidney lower pole. Electronically Signed   By: Adam  Henn M.D.   On: 10/10/2018 17:15   Vas Us Lower Extremity Venous (dvt)  Result Date: 10/26/2018  Lower Venous Study Indications: Swelling, SOB, and positive D-Dimer. Other Indications: Patient c/o shortness of breath and leg swelling. Patient                    inpatient hospital x 24 days from 09/27/18-10/20/18. Anticoagulation: Plavix. Performing Technologist: Alecia Mackin RVT, RDCS (AE), RDMS  Examination Guidelines: A complete evaluation includes B-mode imaging, spectral Doppler, color Doppler, and power Doppler as needed of all accessible portions of each vessel.  Bilateral testing is considered an integral part of a complete examination. Limited examinations for reoccurring indications may be performed as noted.  +---------+---------------+---------+-----------+------------------+-------+ RIGHT    CompressibilityPhasicitySpontaneityProperties        Summary +---------+---------------+---------+-----------+------------------+-------+ CFV      Full           Yes      Yes                                  +---------+---------------+---------+-----------+------------------+-------+ SFJ      Full           Yes      Yes                                  +---------+---------------+---------+-----------+------------------+-------+ FV Prox  Partial        No       No                                   +---------+---------------+---------+-----------+------------------+-------+   FV Mid   None           No       No         brightly echogenicChronic +---------+---------------+---------+-----------+------------------+-------+ FV DistalNone           No       No         brightly echogenicChronic +---------+---------------+---------+-----------+------------------+-------+ PFV      Full                                                         +---------+---------------+---------+-----------+------------------+-------+ POP      Partial        Yes      Yes        brightly echogenicChronic +---------+---------------+---------+-----------+------------------+-------+ PTV      Full           Yes                                           +---------+---------------+---------+-----------+------------------+-------+ PERO     Full           Yes                                           +---------+---------------+---------+-----------+------------------+-------+ Gastroc  Full                                                         +---------+---------------+---------+-----------+------------------+-------+ GSV      Full            Yes      Yes                                  +---------+---------------+---------+-----------+------------------+-------+  +---------+---------------+---------+-----------+----------+-------+ LEFT     CompressibilityPhasicitySpontaneityPropertiesSummary +---------+---------------+---------+-----------+----------+-------+ CFV      Full           Yes      Yes                          +---------+---------------+---------+-----------+----------+-------+ SFJ      Full           Yes      Yes                          +---------+---------------+---------+-----------+----------+-------+ FV Prox  Full           Yes      Yes                          +---------+---------------+---------+-----------+----------+-------+ FV Mid   Full           Yes      Yes                          +---------+---------------+---------+-----------+----------+-------+  FV DistalFull           Yes      Yes                          +---------+---------------+---------+-----------+----------+-------+ PFV      Full                                                 +---------+---------------+---------+-----------+----------+-------+ POP      Full           Yes      Yes                          +---------+---------------+---------+-----------+----------+-------+ PTV      Full           Yes      Yes                          +---------+---------------+---------+-----------+----------+-------+ PERO     Full           Yes      Yes                          +---------+---------------+---------+-----------+----------+-------+ Gastroc  Full                                                 +---------+---------------+---------+-----------+----------+-------+ GSV      Full           Yes      Yes                          +---------+---------------+---------+-----------+----------+-------+  Findings reported to [place] at Dr. Antony Contras office at 11:30am.  Summary:  Right: Findings  consistent with chronic deep vein thrombosis involving the right femoral vein, and right popliteal vein. No cystic structure found in the popliteal fossa. All other veins visualized appear fully compressible and demonstrate appropriate Doppler characteristics.  Left: No evidence of deep vein thrombosis in the lower extremity. No indirect evidence of obstruction proximal to the inguinal ligament. No cystic structure found in the popliteal fossa.  *See table(s) above for measurements and observations.    Preliminary     Microbiology: Recent Results (from the past 240 hour(s))  Culture, blood (routine x 2)     Status: None (Preliminary result)   Collection Time: 10/26/18  4:30 PM  Result Value Ref Range Status   Specimen Description BLOOD RIGHT ANTECUBITAL  Final   Special Requests   Final    BOTTLES DRAWN AEROBIC AND ANAEROBIC Blood Culture results may not be optimal due to an inadequate volume of blood received in culture bottles   Culture   Final    NO GROWTH < 24 HOURS Performed at [facility] [address] [place], [place] [address]    Report Status PENDING  Incomplete  SARS Coronavirus 2 [facility] order, Performed in [place] hospital lab)     Status: None   Collection Time: 10/26/18  4:30 PM  Result Value Ref Range Status   SARS Coronavirus 2  NEGATIVE NEGATIVE Final    Comment: (NOTE) If result is NEGATIVE SARS-CoV-2 target nucleic acids are NOT DETECTED. The SARS-CoV-2 RNA is generally detectable in upper and lower  respiratory specimens during the acute phase of infection. The lowest  concentration of SARS-CoV-2 viral copies this assay can detect is 250  copies / mL. A negative result does not preclude SARS-CoV-2 infection  and should not be used as the sole basis for treatment or other  patient management decisions.  A negative result may occur with  improper specimen collection / handling, submission of specimen other  than nasopharyngeal swab, presence of viral  mutation(s) within the  areas targeted by this assay, and inadequate number of viral copies  (<250 copies / mL). A negative result must be combined with clinical  observations, patient history, and epidemiological information. If result is POSITIVE SARS-CoV-2 target nucleic acids are DETECTED. The SARS-CoV-2 RNA is generally detectable in upper and lower  respiratory specimens dur ing the acute phase of infection.  Positive  results are indicative of active infection with SARS-CoV-2.  Clinical  correlation with patient history and other diagnostic information is  necessary to determine patient infection status.  Positive results do  not rule out bacterial infection or co-infection with other viruses. If result is PRESUMPTIVE POSTIVE SARS-CoV-2 nucleic acids MAY BE PRESENT.   A presumptive positive result was obtained on the submitted specimen  and confirmed on repeat testing.  While 2019 novel coronavirus  (SARS-CoV-2) nucleic acids may be present in the submitted sample  additional confirmatory testing may be necessary for epidemiological  and / or clinical management purposes  to differentiate between  SARS-CoV-2 and other Sarbecovirus currently known to infect humans.  If clinically indicated additional testing with an alternate test  methodology ([id number]) is advised. The SARS-CoV-2 RNA is generally  detectable in upper and lower respiratory sp ecimens during the acute  phase of infection. The expected result is Negative. Fact Sheet for Patients:  https://www.fda.gov/media/136312/download Fact Sheet for Healthcare Providers: https://www.fda.gov/media/136313/download This test is not yet approved or cleared by the [place] FDA and has been authorized for detection and/or diagnosis of SARS-CoV-2 by FDA under an Emergency Use Authorization (EUA).  This EUA will remain in effect (meaning this test can be used) for the duration of the COVID-19 declaration under Section 564(b)(1)  of the Act, 21 U.S.C. section 360bbb-3(b)(1), unless the authorization is terminated or revoked sooner. Performed at [facility], [address] [place], [place] [address]   Culture, blood (routine x 2)     Status: None (Preliminary result)   Collection Time: 10/26/18  4:35 PM  Result Value Ref Range Status   Specimen Description BLOOD RIGHT ANTECUBITAL  Final   Special Requests   Final    BOTTLES DRAWN AEROBIC AND ANAEROBIC Blood Culture adequate volume   Culture   Final    NO GROWTH < 24 HOURS Performed at [facility], [address] [place], [place] [address]    Report Status PENDING  Incomplete     Labs: Basic Metabolic Panel: Recent Labs  Lab 10/24/18 1150 10/26/18 1641 10/26/18 1653 10/27/18 0749 10/27/18 0935  NA 138 138 138 138  --   K 4.1 4.3 4.3 4.8  --   CL 102 105  --  101  --   CO2 29 23  --  24  --   GLUCOSE 95 118*  --  108*  --   BUN 67* 61*  --    75*  --   CREATININE 4.73* 4.57*  --  5.10*  --   CALCIUM 7.8* 7.6*  --  8.1*  --   MG  --  1.5*  --   --   --   PHOS  --  3.3  --   --  6.3*   Liver Function Tests: Recent Labs  Lab 10/24/18 1150 10/26/18 1641  AST 45* 46*  ALT 19 27  ALKPHOS  --  42  BILITOT 0.9 0.9  PROT 5.5* 5.3*  ALBUMIN  --  2.0*   No results for input(s): LIPASE, AMYLASE in the last 168 hours. No results for input(s): AMMONIA in the last 168 hours. CBC: Recent Labs  Lab 10/24/18 1150 10/26/18 1641 10/26/18 1653 10/27/18 0749  WBC 4.2 2.2*  --  2.4*  NEUTROABS 3,536 1.9  --   --   HGB 8.2* 7.9* 7.1* 8.3*  HCT 24.2* 24.5* 21.0* 26.6*  MCV 89.6 94.6  --  92.7  PLT 116* 89*  --  90*   Cardiac Enzymes: Recent Labs  Lab 10/26/18 1641 10/26/18 2205 10/27/18 0034 10/27/18 0749  TROPONINI 0.09* 0.07* 0.07* 0.06*   BNP: BNP (last 3 results) Recent Labs    10/26/18 1641  BNP 859.8*    ProBNP (last 3 results) No results for input(s): PROBNP in the last 8760 hours.  CBG: Recent Labs  Lab  10/20/18 1600 10/26/18 2156 10/27/18 0740 10/27/18 1159 10/27/18 1300  GLUCAP 160* 212* 104* 157* 125*       Signed:  BLACK,KAREN M NP [facility] 10/27/2018, 3:25 PM    

## 2018-10-27 NOTE — Progress Notes (Signed)
[place] for Bivalirudin Indication: DVT and possible PE  Allergies  Allergen Reactions  . Gelatin Other (See Comments)    ALPHA-GAL DANGER  . Meat [Alpha-Gal] Other (See Comments)    REACTION TO HOOVED ANIMALS PARTICULARLY RED MEAT  . Pork-Derived Products Other (See Comments)    ALPHA-GAL DANGER  . Shellfish Allergy Shortness Of Breath  . Ramipril Swelling  . Codeine Nausea And Vomiting  . Morphine Itching    Patient Measurements: Height: 5\' 10"  (177.8 cm) Weight: 187 lb 4.8 oz (85 kg) IBW/kg (Calculated) : 73  Vital Signs: Temp: 97.3 F (36.3 C) (04/30 0500) Temp Source: Oral (04/30 0500) BP: 153/89 (04/30 0500) Pulse Rate: 87 (04/30 0500)  Labs: Recent Labs    10/24/18 1150  10/26/18 1641 10/26/18 1653  10/26/18 2205 10/27/18 0034 10/27/18 0335 10/27/18 0749  HGB 8.2*  --  7.9* 7.1*  --   --   --   --  8.3*  HCT 24.2*  --  24.5* 21.0*  --   --   --   --  26.6*  PLT 116*  --  89*  --   --   --   --   --  90*  APTT  --   --   --   --    < > 31 53* 70* 72*  LABPROT  --   --  15.0  --   --   --   --   --   --   INR  --   --  1.2  --   --   --   --   --   --   CREATININE 4.73*  --  4.57*  --   --   --   --   --  5.10*  TROPONINI  --    < > 0.09*  --   --  0.07* 0.07*  --  0.06*   < > = values in this interval not displayed.    Estimated Creatinine Clearance: 13.1 mL/min (A) (by C-G formula based on SCr of 5.1 mg/dL (H)).    Assessment: [name] is a 73 year old male presenting with DVT, suspected PE and alpha-gal allergy. Patient has PMH significant for DM, renal failure, hypertension, bladder cancer. Recent admission with an AKI on 3/31.  Patient's platelets low at 89, low hgb at 7.9>7.1, hct low at 24.5>21. Patient has low crcl at 14.68ml/min.  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  Patient had previously been exposed to heparin via dialysis and subcutaneously for DVT prophylaxis last admission on 3/31, upon chart review it appears that all heparin products were discontinued and swapped to heparin alternatives for anticoagulation in hemodialysis machines and DVT prophylaxis.   After discussion with Dr. Blaine Hamper, it was decided to start Bivalirudin for DVT treatment on this patient.   PTT therapeutic  Goal of Therapy:  Target aPTT 50-85 seconds Monitor platelets by anticoagulation protocol: Yes   Plan:  Continue bivalirudin gtt at 0.06mg /kg/hr Follow up AM labs  Thank you Anette Guarneri, PharmD [phone] 10/27/2018 9:49 AM

## 2018-10-27 NOTE — Progress Notes (Signed)
Called about need for Plavix in setting of DVT and plan for Eliquis and low platelets.  Patient has hx of ax-fem and fem-fem and does not need plavix from our standpoint.  Appears this plavix was started by Cardiology in 2012.   Marty Heck, MD [facility] Office: [phone] Pager: [place]

## 2018-10-27 NOTE — Progress Notes (Addendum)
[facility] Progress Note   Subjective:   Patient seen on HD. 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 Presented with SOB, chronic DVT in RLE on doppler. No CTA due to AKI. Ruled out for COVID-19. Reports SOB is somewhat improved, having occasional pleuritic pain with deep breaths. No CP, palpitations, abdominal pain, N/V/D. + peripheral edema. Tolerating HD well so far.   Objective Vitals:   10/27/18 0930 10/27/18 1000 10/27/18 1030 10/27/18 1100  BP: 127/85 107/68 98/60 (!) 90/58  Pulse: 92 86 88 82  Resp:      Temp:      TempSrc:      SpO2:      Weight:      Height:       Physical Exam General: Well developed male, alert and in NAD Heart: RRR, no murmurs, rubs or gallops Lungs: Decreased breath sounds bilaterally. No wheezing, rhonchi or rales auscultated Abdomen: Soft, non-tender, non-distended. Normoactive BS Extremities: 2+ pitting edema bilateral lower extremities Dialysis Access:  [facility] with surrounding bruising, no erythema or drainage.   Additional Objective Labs: Basic Metabolic Panel: Recent Labs  Lab 10/24/18 1150 10/26/18 1641 10/26/18 1653 10/27/18 0749 10/27/18 0935  NA 138 138 138 138  --   K 4.1 4.3 4.3 4.8  --   CL 102 105  --  101  --   CO2 29 23  --  24  --   GLUCOSE 95 118*  --  108*  --   BUN 67* 61*  --  75*  --   CREATININE 4.73* 4.57*  --  5.10*  --   CALCIUM 7.8* 7.6*  --  8.1*  --   PHOS  --  3.3  --   --  6.3*   Liver Function Tests: Recent Labs  Lab 10/24/18 1150 10/26/18 1641  AST 45* 46*  ALT 19 27  ALKPHOS  --  42  BILITOT 0.9 0.9  PROT 5.5* 5.3*  ALBUMIN  --  2.0*   CBC: Recent Labs  Lab 10/24/18 1150 10/26/18 1641 10/26/18 1653 10/27/18 0749  WBC 4.2 2.2*  --  2.4*  NEUTROABS 3,536 1.9  --   --   HGB 8.2* 7.9* 7.1* 8.3*  HCT 24.2* 24.5* 21.0* 26.6*  MCV 89.6 94.6  --  92.7  PLT 116* 89*  --  90*   Blood Culture    Component Value Date/Time   SDES BLOOD RIGHT  ANTECUBITAL 10/26/2018 1635   SPECREQUEST  10/26/2018 1635    BOTTLES DRAWN AEROBIC AND ANAEROBIC Blood Culture adequate volume   CULT  10/26/2018 1635    NO GROWTH < 24 HOURS Performed at [facility], [place] [address] [place], [place] [address]    REPTSTATUS PENDING 10/26/2018 1635    Cardiac Enzymes: Recent Labs  Lab 10/26/18 1641 10/26/18 2205 10/27/18 0034 10/27/18 0749  TROPONINI 0.09* 0.07* 0.07* 0.06*   CBG: Recent Labs  Lab 10/20/18 1600 10/26/18 2156 10/27/18 0740  GLUCAP 160* 212* 104*    Studies/Results: Dg Chest 2 View  Result Date: 10/26/2018 CLINICAL DATA:  Shortness of breath, swelling in legs EXAM: CHEST - 2 VIEW COMPARISON:  10/17/2018 FINDINGS: RIGHT jugular Port-A-Cath with tip projecting over SVC. Normal heart size, mediastinal contours, and pulmonary vascularity. Atherosclerotic calcifications aorta. Chronic interstitial infiltrates again identified. Calcified granuloma lower LEFT chest. No segmental consolidation, pleural effusion or pneumothorax. Bones unremarkable. IMPRESSION: Chronic interstitial infiltrates throughout both lungs, slightly greater at bases, favor  chronic interstitial lung disease. No definite new or segmental infiltrate identified. Electronically Signed   By: Lavonia Dana M.D.   On: 10/26/2018 15:08   Vas [place] Lower Extremity Venous (dvt)  Result Date: 10/26/2018  Lower Venous Study Indications: Swelling, SOB, and positive D-Dimer. Other Indications: Patient c/o shortness of breath and leg swelling. Patient                    inpatient hospital x 24 days from 09/27/18-10/20/18. Anticoagulation: Plavix. Performing Technologist: Salvadore Dom RVT, RDCS (AE), RDMS  Examination Guidelines: A complete evaluation includes B-mode imaging, spectral Doppler, color Doppler, and power Doppler as needed of all accessible portions of each vessel. Bilateral testing is considered an integral part of a complete examination. Limited examinations for  reoccurring indications may be performed as noted.  +---------+---------------+---------+-----------+------------------+-------+ RIGHT    CompressibilityPhasicitySpontaneityProperties        Summary +---------+---------------+---------+-----------+------------------+-------+ CFV      Full           Yes      Yes                                  +---------+---------------+---------+-----------+------------------+-------+ SFJ      Full           Yes      Yes                                  +---------+---------------+---------+-----------+------------------+-------+ FV Prox  Partial        No       No                                   +---------+---------------+---------+-----------+------------------+-------+ FV Mid   None           No       No         brightly echogenicChronic +---------+---------------+---------+-----------+------------------+-------+ FV DistalNone           No       No         brightly echogenicChronic +---------+---------------+---------+-----------+------------------+-------+ PFV      Full                                                         +---------+---------------+---------+-----------+------------------+-------+ POP      Partial        Yes      Yes        brightly echogenicChronic +---------+---------------+---------+-----------+------------------+-------+ PTV      Full           Yes                                           +---------+---------------+---------+-----------+------------------+-------+ PERO     Full           Yes                                           +---------+---------------+---------+-----------+------------------+-------+  Gastroc  Full                                                         +---------+---------------+---------+-----------+------------------+-------+ GSV      Full           Yes      Yes                                   +---------+---------------+---------+-----------+------------------+-------+  +---------+---------------+---------+-----------+----------+-------+ LEFT     CompressibilityPhasicitySpontaneityPropertiesSummary +---------+---------------+---------+-----------+----------+-------+ CFV      Full           Yes      Yes                          +---------+---------------+---------+-----------+----------+-------+ SFJ      Full           Yes      Yes                          +---------+---------------+---------+-----------+----------+-------+ FV Prox  Full           Yes      Yes                          +---------+---------------+---------+-----------+----------+-------+ FV Mid   Full           Yes      Yes                          +---------+---------------+---------+-----------+----------+-------+ FV DistalFull           Yes      Yes                          +---------+---------------+---------+-----------+----------+-------+ PFV      Full                                                 +---------+---------------+---------+-----------+----------+-------+ POP      Full           Yes      Yes                          +---------+---------------+---------+-----------+----------+-------+ PTV      Full           Yes      Yes                          +---------+---------------+---------+-----------+----------+-------+ PERO     Full           Yes      Yes                          +---------+---------------+---------+-----------+----------+-------+ Gastroc  Full                                                 +---------+---------------+---------+-----------+----------+-------+  GSV      Full           Yes      Yes                          +---------+---------------+---------+-----------+----------+-------+  Findings reported to [place] at Dr. Antony Contras office at 11:30am.  Summary:  Right: Findings consistent with chronic deep vein thrombosis involving  the right femoral vein, and right popliteal vein. No cystic structure found in the popliteal fossa. All other veins visualized appear fully compressible and demonstrate appropriate Doppler characteristics.  Left: No evidence of deep vein thrombosis in the lower extremity. No indirect evidence of obstruction proximal to the inguinal ligament. No cystic structure found in the popliteal fossa.  *See table(s) above for measurements and observations.    Preliminary    Medications: . sodium chloride    . sodium chloride    . anticoagulant sodium citrate    . bivalirudin (ANGIOMAX) infusion 0.5 mg/mL (Non-ACS indications) 0.06 mg/kg/hr (10/27/18 0203)   . atorvastatin  80 mg Oral Daily  . calcitRIOL  0.5 mcg Oral Q T,Th,Sa-HD  . calcium acetate  667 mg Oral TID WC  . Chlorhexidine Gluconate Cloth  6 each Topical Q0600  . clopidogrel  75 mg Oral Daily  . darbepoetin (ARANESP) injection - DIALYSIS  200 mcg Intravenous Q Thu-HD  . docusate sodium  100 mg Oral Daily  . doxazosin  2 mg Oral Daily  . insulin aspart  0-5 Units Subcutaneous QHS  . insulin aspart  0-9 Units Subcutaneous TID WC  . metoprolol succinate  25 mg Oral Daily  . mometasone-formoterol  2 puff Inhalation BID  . nicotine  21 mg Transdermal Daily  . pantoprazole  40 mg Oral Daily  . predniSONE  60 mg Oral Q breakfast  . sulfamethoxazole-trimethoprim  1 tablet Oral Once per day on Mon Wed Fri    Dialysis Orders: [facility] TTS T: 3h 73min; BFR 350/DFR 500. EDW 85kg. 180NRe, K2/Ca 2.5. TDC Heparin for catheter locks only- no bolus  Assessment/Plan: 1. Shortness on breath: Presented with SOB, chronic DVT in RLE on doppler. Chest x-ray in ED showed Chronic interstitial infiltrates throughout both lungs, slightly greater at bases, favor chronic interstitial lung disease. Ruled out for COVID-19. Concern for PE however CTA not completed due to AKI. Started on Angiomax per pharmacy recommendation due to heparin allergy.  Plan for V/Q per primary notes.  2. 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  Patient has heparin allergy- heparin was being used to cath locks at outpatient unit but no heparin bolus. Will use citrate for catheter locks while inpatient. Continue prednisone, s/p rituxan on 4/23.  3. HTN/volume: BP controlled, 2+ pitting edema on exam. HD today with UF goal of 2-3L.    4. Anemia/Pancytopenia: Hemoglobin 8.3. Aranesp 200 mcg ordered with HD today, continue weekly. No bleeding reported.  Follow. Leukopenia and thrombocytopenia- chronic, per primary.  5. Secondary hyperparathyroidism:  Calcium 8.1, corrected 9.7. Calcitriol started yesterday. Phos 6.3- continue calcium acetate and trend phosphorus. 6. Nutrition: Albumin 2.0- has meat/gelatin allergy. Continue renal diet and fluid restrictions.  7. COPD: With interstitial changes on x-ray. Continue dulera and albuterol, management per primary. 8. T2DM: Last A1c 5.3, insulin per primary.   Anice Paganini, PA-C 10/27/2018, 11:07 AM  [facility] Pager: [phone]  Pt seen, examined and agree w A/P  as above.  [facility] 10/27/2018, 4:57 PM
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
## 2018-10-27 NOTE — Discharge Instructions (Addendum)
Deep Vein Thrombosis  Deep vein thrombosis (DVT) is a condition in which a blood clot forms in a deep vein, such as a lower leg, thigh, or arm vein. A clot is blood that has thickened into a gel or solid. This condition is dangerous. It can lead to serious and even life-threatening complications if the clot travels to the lungs and causes a blockage (pulmonary embolism). It can also damage veins in the leg. This can result in leg pain, swelling, discoloration, and sores (post-thrombotic syndrome). What are the causes? This condition may be caused by:  A slowdown of blood flow.  Damage to a vein.  A condition that causes blood to clot more easily, such as an inherited clotting disorder. What increases the risk? The following factors may make you more likely to develop this condition:  Being overweight.  Being older, especially over age 59.  Sitting or lying down for more than four hours.  Being in the hospital.  Lack of physical activity (sedentary lifestyle).  Pregnancy, being in childbirth, or having recently given birth.  Taking medicines that contain estrogen, such as medicines to prevent pregnancy.  Smoking.  A history of any of the following: ? Blood clots or a blood clotting disease. ? Peripheral vascular disease. ? Inflammatory bowel disease. ? Cancer. ? Heart disease. ? Genetic conditions that affect how your blood clots, such as Factor V Leiden mutation. ? Neurological diseases that affect your legs (leg paresis). ? A recent injury, such as a car accident. ? Major or lengthy surgery. ? A central line placed inside a large vein. What are the signs or symptoms? Symptoms of this condition include:  Swelling, pain, or tenderness in an arm or leg.  Warmth, redness, or discoloration in an arm or leg. If the clot is in your leg, symptoms may be more noticeable or worse when you stand or walk. Some people may not develop any symptoms. How is this diagnosed? This  condition is diagnosed with:  A medical history and physical exam.  Tests, such as: ? Blood tests. These are done to check how well your blood clots. ? Ultrasound. This is done to check for clots. ? Venogram. For this test, contrast dye is injected into a vein and X-rays are taken to check for any clots. How is this treated? Treatment for this condition depends on:  The cause of your DVT.  Your risk for bleeding or developing more clots.  Any other medical conditions that you have. Treatment may include:  Taking a blood thinner (anticoagulant). This type of medicine prevents clots from forming. It may be taken by mouth, injected under the skin, or injected through an IV (catheter).  Injecting clot-dissolving medicines into the affected vein (catheter-directed thrombolysis).  Having surgery. Surgery may be done to: ? Remove the clot. ? Place a filter in a large vein to catch blood clots before they reach the lungs. Some treatments may be continued for up to six months. Follow these instructions at home: If you are taking blood thinners:  Take the medicine exactly as told by your health care provider. Some blood thinners need to be taken at the same time every day. Do not skip a dose.  Talk with your health care provider before you take any medicines that contain aspirin or NSAIDs. These medicines increase your risk for dangerous bleeding.  Ask your health care provider about foods and drugs that could change the way the medicine works (may interact). Avoid those things if your  health care provider tells you to do so.  Blood thinners can cause easy bruising and may make it difficult to stop bleeding. Because of this: ? Be very careful when using knives, scissors, or other sharp objects. ? Use an electric razor instead of a blade. ? Avoid activities that could cause injury or bruising, and follow instructions about how to prevent falls.  Wear a medical alert bracelet or carry a  card that lists what medicines you take. General instructions  Take over-the-counter and prescription medicines only as told by your health care provider.  Return to your normal activities as told by your health care provider. Ask your health care provider what activities are safe for you.  Wear compression stockings if recommended by your health care provider.  Keep all follow-up visits as told by your health care provider. This is important. How is this prevented? To lower your risk of developing this condition again:  For 30 or more minutes every day, do an activity that: ? Involves moving your arms and legs. ? Increases your heart rate.  When traveling for longer than four hours: ? Exercise your arms and legs every hour. ? Drink plenty of water. ? Avoid drinking alcohol.  Avoid sitting or lying for a long time without moving your legs.  If you have surgery or you are hospitalized, ask about ways to prevent blood clots. These may include taking frequent walks or using anticoagulants.  Stay at a healthy weight.  If you are a woman who is older than age 43, avoid unnecessary use of medicines that contain estrogen, such as some birth control pills.  Do not use any products that contain nicotine or tobacco, such as cigarettes and e-cigarettes. This is especially important if you take estrogen medicines. If you need help quitting, ask your health care provider. Contact a health care provider if:  You miss a dose of your blood thinner.  Your menstrual period is heavier than usual.  You have unusual bruising. Get help right away if:  You have: ? New or increased pain, swelling, or redness in an arm or leg. ? Numbness or tingling in an arm or leg. ? Shortness of breath. ? Chest pain. ? A rapid or irregular heartbeat. ? A severe headache or confusion. ? A cut that will not stop bleeding.  There is blood in your vomit, stool, or urine.  You have a serious fall or accident,  or you hit your head.  You feel light-headed or dizzy.  You cough up blood. These symptoms may represent a serious problem that is an emergency. Do not wait to see if the symptoms will go away. Get medical help right away. Call your local emergency services (911 in the [place] Do not drive yourself to the hospital. Summary  Deep vein thrombosis (DVT) is a condition in which a blood clot forms in a deep vein, such as a lower leg, thigh, or arm vein.  Symptoms can include swelling, warmth, pain, and redness in your leg or arm.  This condition may be treated with a blood thinner (anticoagulant medicine), medicine that is injected to dissolve blood clots,compression stockings, or surgery.  If you are prescribed blood thinners, take them exactly as told. This information is not intended to replace advice given to you by your health care provider. Make sure you discuss any questions you have with your health care provider. Document Released: 06/15/2005 Document Revised: 11/13/2016 Document Reviewed: 11/13/2016 Elsevier Interactive Patient Education  2019 [employer].  Information on my medicine - ELIQUIS (apixaban)  This medication education was reviewed with me or my healthcare representative as part of my discharge preparation.  The pharmacist that spoke with me during my hospital stay was:  Pat Patrick, [facility]  Why was Eliquis prescribed for you? Eliquis was prescribed for you to reduce the risk of forming blood clots that can cause a stroke if you have a medical condition called atrial fibrillation (a type of irregular heartbeat) OR to reduce the risk of a blood clots forming after orthopedic surgery.  What do You need to know about Eliquis ? Take your Eliquis TWICE DAILY - one tablet in the morning and one tablet in the evening with or without food.  It would be best to take the doses about the same time each day.  If you have difficulty swallowing the tablet whole please discuss  with your pharmacist how to take the medication safely.  Take Eliquis exactly as prescribed by your doctor and DO NOT stop taking Eliquis without talking to the doctor who prescribed the medication.  Stopping may increase your risk of developing a new clot or stroke.  Refill your prescription before you run out.  After discharge, you should have regular check-up appointments with your healthcare provider that is prescribing your Eliquis.  In the future your dose may need to be changed if your kidney function or weight changes by a significant amount or as you get older.  What do you do if you miss a dose? If you miss a dose, take it as soon as you remember on the same day and resume taking twice daily.  Do not take more than one dose of ELIQUIS at the same time.  Important Safety Information A possible side effect of Eliquis is bleeding. You should call your healthcare provider right away if you experience any of the following: ? Bleeding from an injury or your nose that does not stop. ? Unusual colored urine (red or dark brown) or unusual colored stools (red or black). ? Unusual bruising for unknown reasons. ? A serious fall or if you hit your head (even if there is no bleeding).  Some medicines may interact with Eliquis and might increase your risk of bleeding or clotting while on Eliquis. To help avoid this, consult your healthcare provider or pharmacist prior to using any new prescription or non-prescription medications, including herbals, vitamins, non-steroidal anti-inflammatory drugs (NSAIDs) and supplements.  This website has more information on Eliquis (apixaban): [email or web address].no.

## 2018-10-27 NOTE — Progress Notes (Signed)
[place] for Bivalirudin > Eliquis Indication: DVT and possible PE  Allergies  Allergen Reactions  . Gelatin Other (See Comments)    ALPHA-GAL DANGER  . Meat [Alpha-Gal] Other (See Comments)    REACTION TO HOOVED ANIMALS PARTICULARLY RED MEAT  . Pork-Derived Products Other (See Comments)    ALPHA-GAL DANGER  . Shellfish Allergy Shortness Of Breath  . Ramipril Swelling  . Codeine Nausea And Vomiting  . Morphine Itching    Patient Measurements: Height: 5\' 10"  (177.8 cm) Weight: 182 lb 12.2 oz (82.9 kg) IBW/kg (Calculated) : 73  Vital Signs: Temp: 98.3 F (36.8 C) (04/30 1354) Temp Source: Oral (04/30 1354) BP: 125/85 (04/30 1354) Pulse Rate: 57 (04/30 1354)  Labs: Recent Labs    10/26/18 1641 10/26/18 1653  10/26/18 2205 10/27/18 0034 10/27/18 0335 10/27/18 0749  HGB 7.9* 7.1*  --   --   --   --  8.3*  HCT 24.5* 21.0*  --   --   --   --  26.6*  PLT 89*  --   --   --   --   --  90*  APTT  --   --    < > 31 53* 70* 72*  LABPROT 15.0  --   --   --   --   --   --   INR 1.2  --   --   --   --   --   --   CREATININE 4.57*  --   --   --   --   --  5.10*  TROPONINI 0.09*  --   --  0.07* 0.07*  --  0.06*   < > = values in this interval not displayed.    Estimated Creatinine Clearance: 13.1 mL/min (A) (by C-G formula based on SCr of 5.1 mg/dL (H)).    Assessment: [name] is a 73 year old male presenting with DVT, suspected PE and alpha-gal allergy. Patient has PMH significant for DM, renal failure, hypertension, bladder cancer. Recent admission with an AKI on 3/31.  Patient's platelets low at 89, low hgb at 7.9>7.1, hct low at 24.5>21. Patient has low crcl at 14.57ml/min.  Spoke directly with patient who confirmed his alpha-gel allergy is anaphylactic in nature (throat swells, heart rate increases thus he does not eat pork products and other meat products, etc).  Patient had previously been exposed to heparin via dialysis and  subcutaneously for DVT prophylaxis last admission on 3/31, upon chart review it appears that all heparin products were discontinued and swapped to heparin alternatives for anticoagulation in hemodialysis machines and DVT prophylaxis.   After discussion with Dr. Blaine Hamper, it was decided to start Bivalirudin for DVT treatment on this patient.   PTT therapeutic  Goal of Therapy:  Target aPTT 50-85 seconds Monitor platelets by anticoagulation protocol: Yes   Plan:  Spoke with MD- he thinks clot chronic in nature. Start Eliquis 5 mg BID. Will education patient prior to discharge.  Marguerite Olea, [facility] Clinical Pharmacist Phone [phone]  10/27/2018 2:28 PM

## 2018-10-27 NOTE — Plan of Care (Signed)
  Problem: Clinical Measurements: Goal: Respiratory complications will improve Outcome: Progressing Note:  No s/s of respiratory complications noted.  Stable on room air.

## 2018-10-27 NOTE — Progress Notes (Addendum)
Renal Navigator notified OP HD clinic of patient's admission/discharge and negative COVID 19 test result to provide continuity of care.  Alphonzo Cruise Renal Navigator [phone]

## 2018-10-28 ENCOUNTER — Ambulatory Visit: Payer: Medicare HMO

## 2018-10-28 ENCOUNTER — Other Ambulatory Visit: Payer: Self-pay

## 2018-10-28 LAB — URINE CULTURE: Culture: <10000 — AB

## 2018-10-29 DIAGNOSIS — N179 Acute kidney failure, unspecified: Secondary | ICD-10-CM | POA: Diagnosis not present

## 2018-10-29 DIAGNOSIS — N2581 Secondary hyperparathyroidism of renal origin: Secondary | ICD-10-CM | POA: Diagnosis not present

## 2018-10-31 ENCOUNTER — Ambulatory Visit (INDEPENDENT_AMBULATORY_CARE_PROVIDER_SITE_OTHER): Payer: Medicare HMO | Admitting: Family Medicine

## 2018-10-31 ENCOUNTER — Other Ambulatory Visit: Payer: Self-pay

## 2018-10-31 DIAGNOSIS — E1121 Type 2 diabetes mellitus with diabetic nephropathy: Secondary | ICD-10-CM

## 2018-10-31 DIAGNOSIS — N059 Unspecified nephritic syndrome with unspecified morphologic changes: Secondary | ICD-10-CM | POA: Diagnosis not present

## 2018-10-31 DIAGNOSIS — Z794 Long term (current) use of insulin: Secondary | ICD-10-CM

## 2018-10-31 DIAGNOSIS — R0600 Dyspnea, unspecified: Secondary | ICD-10-CM | POA: Diagnosis not present

## 2018-10-31 DIAGNOSIS — I82401 Acute embolism and thrombosis of unspecified deep veins of right lower extremity: Secondary | ICD-10-CM | POA: Diagnosis not present

## 2018-10-31 LAB — CULTURE, BLOOD (ROUTINE X 2)
Culture: NO GROWTH
Culture: NO GROWTH
Special Requests: ADEQUATE

## 2018-10-31 NOTE — Progress Notes (Signed)
Subjective:    Patient ID: [name]., male    DOB: 1946/06/05, 73 y.o.   MRN: [id number]  HPI  07/19/18 At my last visit, the patient was having symptoms from her concerning for claudication.  Initially her work-up included evaluation of his peripheral vascular disease.  However it turned out the patient was having neurogenic claudication secondary to thoracic myelopathy.  Recently admitted to the hospital for back surgery.  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  Postop [name] doing very well.  Preoperative back and lower extremity pain much improved.  Standing and walking much better.  Continent of urine.  Patient feels much better and ready for discharge home.  07/19/18 Here today for follow-up.  I reviewed labs from June 02, 2018.  At that time he had a BMP which showed a creatinine of 0.99 as well as a blood sugar of 109.  Hemoglobin A1c was obtained at that point and showed excellent glycemic control at 5.3.  A CBC was obtained which was significant for a normal white blood cell count, normal hemoglobin however mild thrombocytopenia with platelet count of 106.  Patient is overdue for a fasting lipid panel.  He is taking Lipitor 80 mg a day as well as Zetia 10 mg a day.  Ideally his LDL cholesterol be less than 70.  Given his extensive cardiovascular history including significant peripheral vascular disease as well as coronary artery disease with history of stenting of his right coronary artery, patient is at high risk for congestive heart failure.  Patient states that he is doing much  better after the surgery.  The pain has improved in his legs with ambulation.  He is no longer having to use a cane or walker to get around.  However he reports severe fatigue.  He states that he sleeps sometimes 12 to 15 hours a day.  However he still feels extremely tired.  He can easily fall asleep despite getting a good night's rest the night before.  He never feels rested.  He is always sleepy.  He denies any chest pain however he does have chronic shortness of breath with exertion.  He denies any angina.  He denies any weight loss or fevers.  At that time, my plan was: Given the patient's extensive past medical history, the differential diagnosis for his fatigue and hypersomnolence is large.  The biggest concern I have is for obstructive sleep apnea.  He was scheduled for a sleep study in 2017 however I do not see where that ever occurred.  Therefore I recommended a referral for a sleep study to evaluate for obstructive sleep apnea.  This may also explain his elevated blood pressure as well.  Also given his history, I would be concerned about congestive heart failure and therefore I would recommend an echocardiogram.  The patient does have congestive heart failure, in addition to tailoring his medication, I will switch him from metformin to [place] for his diabetes.  His blood pressure is elevated today however I am concerned that his medication list is incorrect.  The patient is not certain exactly what he is taking.  Therefore of asked the patient  go home and call us back immediately and talk to my nurse and give [place] an updated medicine list with exactly what medication he is taking we can adjust his medication appropriately to achieve control his blood pressure.  I would also check a CBC, CMP, TSH, B12.    09/23/18 My recommendations based on his labs were: Labs show mild anemia but I do not believe this is causing his fatigue. Recommend sleep study. Wt Readings from Last 3 Encounters:  10/27/18  182 lb 12.2 oz (82.9 kg)  10/24/18 193 lb (87.5 kg)  10/20/18 185 lb 13.6 oz (84.3 kg)   Since the patient's visit in January, he has lost 12 pounds.  Over the last few weeks he has developed epigastric pain.  It is worse as soon as he eats.  He reports nausea and vomiting.  He denies any melena or hematochezia.  He denies any bright red blood per rectum.  Pain is located in the epigastric area.  However his abdomen today is soft nondistended with normal bowel sounds and no masses other than the abdominal bruit secondary to his bypass.  Patient states however he developed severe epigastric pain as soon as he eats.  Even the thought of food makes him sick.  He denies any chest pain or shortness of breath.  He denies any jaundice.  Patient still has his gallbladder.  CT scan was performed of the abdomen and pelvis in November.  Results are dictated below: FINDINGS: Lower chest: Heart size is normal. There is no significant pericardial fluid, thickening or pericardial calcification. Calcifications of the aortic valve. There is aortic atherosclerosis, as well as atherosclerosis of the coronary arteries, including calcified atherosclerotic plaque in the left main, left anterior descending, left circumflex and right coronary arteries.  Hepatobiliary: Several small calcified granulomas are noted in the liver, including an irregular cluster of calcified granulomas in the right lobe near the dome, which appeared suspicious for potential lesion on prior examinations. However, no discrete suspicious cystic or solid hepatic lesions are identified on today's examination. No intra or extrahepatic biliary ductal dilatation. Gallbladder is normal in appearance.  Pancreas: No pancreatic mass. No pancreatic ductal dilatation. No pancreatic or peripancreatic fluid or inflammatory changes.  Spleen: Unremarkable.  Adrenals/Urinary Tract: 3 mm nonobstructive calculus in the lower pole collecting system of  the left kidney. Multiple subcentimeter low-attenuation lesions are noted in both kidneys, too small to definitively characterize, but statistically likely to represent tiny cysts. No hydroureteronephrosis in the visualized portions of the abdomen. Bilateral adrenal glands are normal in appearance.  Stomach/Bowel: Normal appearance of the stomach. No pathologic dilatation of visualized portions of small bowel or colon. Numerous colonic diverticulae are noted, without surrounding inflammatory changes to suggest an acute diverticulitis at this time.  Vascular/Lymphatic: Aortic atherosclerosis with complete occlusion of the infrarenal abdominal aorta. Reconstitution of flow in the pelvic vasculature, presumably related to the patient's partially visualized bypass graft overlying the left lower hemithorax and left anterior abdominal wall, presumably an axillary femoral bypass graft. No lymphadenopathy noted in the abdomen or pelvis.  Other: No significant volume of ascites and no pneumoperitoneum in the visualized portions of the abdomen.  Musculoskeletal: There are no aggressive appearing lytic or blastic lesions noted in the visualized portions of the skeleton.  IMPRESSION: 1. No suspicious liver lesions. The findings on the prior study appear to represent an irregular cluster of calcified granulomas. This is a benign finding. 2. Aortic atherosclerosis, in addition to left main and 3 vessel  coronary artery disease. In addition, there is complete occlusion of the infrarenal abdominal aorta. Reconstitution of flow in the pelvis presumably from the patient's left-sided bypass graft. 3. 3 mm nonobstructive calculus in the lower pole collecting system of left kidney. Although not a definitive study for the gallbladder, there was no mention of any gallstones.  At that time, my plan was: Differential diagnosis includes pancreatitis versus pancreatic neoplasm, biliary colic,  intestinal ischemia, peptic ulcer disease, gastric cancer, or medication reaction to metformin.  Patient would like to stop metformin and losartan because he believes the symptoms started right when he started these medications.  I will evaluate for pancreatic inflammation with a lipase as well as a CBC and a CMP.  I am very concerned about intestinal ischemia given his past medical history.  However I also believe he needs a GI consultation for EGD to rule out gastric cancer or peptic ulcer disease.  Obtain CT angiogram to evaluate for intestinal ischemia of the abdomen.  Also, patient has had numerous abdominal surgeries so intestinal adhesions possibly: Partial bowel obstruction would be also on the differential however I believe this is less likely given the fact his abdomen is soft and nondistended today  09/26/18 Patient's lab work returned markedly abnormal this morning.  Creatinine had risen to 4.67.  Hemoglobin had dropped to 9.8.  Given the acute renal failure, I recommended possibly going to the emergency room.  However when we called the patient, he stated that he felt better after discontinuing his medication.  At his last office visit I discontinued losartan and metformin as described above.  He states that he felt much better and was not vomiting.  Therefore we elected to try to keep the patient on the hospital and reevaluate him here today for his acute renal failure.  As discussed above, the patient states that he was unable to eat hardly anything for the last 3 weeks.  He was drinking a lot of water to try to maintain his hydration but he was not able to keep down hardly any solid food.  The patient states that he felt like he was dying last week.  However since discontinuing the metformin and the losartan, he states that the nausea and vomiting has completely stopped.  He is now able to eat and drink better.  Patient states that he is about 50% better.  His only lingering symptom is fatigue and  weakness.  He states that he just has very little energy.  At that time, my plan was: Patient wants to avoid going to the hospital especially during the coronavirus pandemic.  Therefore we will institute a work-up for acute renal failure as an outpatient as long as it is reasonable.  At the present time he has no urgent sign for dialysis.  He is not acidotic.  He is not fluid overloaded.  He has no electrolyte disturbances, he is not uremic.  Therefore the present time he is still capable of being worked up as an outpatient.  I will obtain a renal ultrasound to evaluate for any evidence of obstructive nephropathy, renal masses, etc.  I will obtain a renal artery ultrasound given the fact his acute renal failure occurred while taking losartan to rule out renal artery stenosis particular given his underlying vascular history.  I will obtain a urinalysis to evaluate for any evidence of glomerulonephritis.  I will check an SPEP to evaluate for any evidence of multiple myeloma.  However my suspicion to date as I  discussed with the patient is I believe this was multifactorial and primarily due to prerenal azotemia.  I believe his nausea and diarrhea was triggered by the metformin.  I believe this led to dehydration that caused acute renal insufficiency.  I believe then this was exacerbated by his angiotensin receptor blocker which worsened his renal insufficiency via acute tubular necrosis.  I then suspect that the patient may have developed lactic acidosis due to the metformin causing his abdominal pain.  Simply discontinuing his medications have improved his symptoms dramatically.  Therefore I will obtain a BMP to monitor his electrolytes and creatinine today.  Patient will be rehydrated with 1 L of normal saline.  Begin the work-up and recheck the patient later this week and encourage patient to push fluids over the week to improve hydration.  I plan to have the patient come in for lab work on Wednesday to repeat  his BMP and then I would like to see him back in the office on Thursday to see how he is doing.  Urinalysis today shows +3 blood +3 protein.  Is negative nitrites.  Negative leukocyte esterase.  Microscopic analysis shows 0-5 white blood cells 40-60 red blood cells 0-5 squames few bacteria no crystals no casts no yeast.  I believe this is more likely suggestive of ATN  09/27/18 Patient is here today for follow-up as planned.  He had his renal ultrasound this morning.  The results are dictated below however no hydronephrosis or obstructive nephropathy is seen: Right Kidney:  Renal measurements: 12.4 x 6.5 x 5.4 cm = volume: 229 mL . Echogenicity within normal limits. No mass or hydronephrosis visualized.  Left Kidney:  Renal measurements: 14.5 x 5.7 x 6.5 cm = volume: 279 mL. Small cysts in the left kidney, the largest 1.2 cm. Normal echotexture. No hydronephrosis.  Bladder:  Appears normal for degree of bladder distention.  Unfortunately, patient's creatinine has worsened further.  It is now up to 6.02.  More concerning for that is now the patient is demonstrating acidosis as his bicarb is dropped to 15.  He is also demonstrating possible symptoms of uremia.  He reports increasing fatigue.  His weakness has worsened since yesterday despite receiving IV fluids.  He also reports mild confusion.  For instance this morning, when he went to pay to do the ultrasound, he gave the radiologist his debit card instead of his credit card.  When they asked for his credit card, he gave him his driver's license.  Patient states that he feels more lethargic and weak today than he did yesterday.  At that time, my plan was: Unfortunately, the patient's acute renal failure seems to be worsening despite receiving IV fluids and stopping his medications.  I suspect acute tubular necrosis based on his urinalysis results.  Unfortunately he is failing outpatient conservative therapy and therefore I believe he needs  admission with IV fluids and nephrology consultation.  I do not feel this can safely be performed as an outpatient as the patient is starting to demonstrate symptoms of uremia with his mild confusion this morning and his worsening fatigue.  His lab work also suggest mild metabolic acidosis which may necessitate dialysis if he continues down this pathway.  Therefore he will go directly to the emergency room today.  We will call and notify them of his impending arrival  10/24/18 Patient was admitted to the hospital and renal was consulted ultimately the patient underwent a renal biopsy which revealed glomerulonephritis.  I have copied  relevant portions of the discharge summary and included them below for my reference:  Admit date: 09/27/2018 Discharge date: 10/20/2018  Brief/Interim Summary: 73 year old male with past medical history of diabetes mellitus, hypertension and COPD plus history of bladder cancer admitted on 3/31 after coming in with worsening renal failure after being sent over from his primary care doctor's office. Patient at that time related bouts of diarrhea for the past 3 weeks and also found to have a urinalysis with 3+ proteinuria and hematuria. Nephrology consulted and patient went renal biopsy on 10/10/18. 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 Uro was consulted and s/pCystoscopy and cleared for Rituxan infusiondue to history of recurrent bladder cancer. he has been started on dialysis on 4/10/20w Rt IJ HD cath.  4/22: OP HD has been set [place]. At this point patient has been stabilized medically, is on regular scheduled dialysis which is set up for TTS.  We will continue on steroids, prophylactic Bactrim, follow-up with nephrology for outpatient lab monitoring and rituximab infusion and renal  function monitoring.  Discharge Diagnoses:   Acute renal failure:Status post renal biopsy 4/13 with pauci immune GNand minimal scarring,follow-up started on systemic steroid to be tapered off over 8 to 12 weeks, to cont at 60 mg/day for now, s/p rituximab IV x1 then in 24 day.on HD since 4/10,last HD 4/23. Has a right IJ dialysis catheter and is set up for outpatient dialysis TTS at [facility] starting 4/25 at 11 AM.Continue low K diet.  Discussed with the nephrologist this morning and he stable for discharge home today after dialysis.  Acute hypoxic respiratory failure from fluid overload due to renal failure.  Improved with dialysis.EF showed stable.  Pauci immuneGN:See #1.Rituximab as above, steroid and BactrimSSprophylaxis 3 times a week as per nephrology. TOC will arrange meds prior to d/c.  History of recurrent bladder cancer, seen by urology and underwent bedside cystoscopyand has noRecurrence.  Type 2 diabetes mellitus without complication,hba1c 5.3 in 05/2018. Stable mostlybut was hypoglycemic at times. Advised close monitoring of blood sugar at home  and use modified sliding scale insulin only for high blood sugar >180, since patient is on high-dose steroids and will be continued on it for some time, he is at risk for uncontrolled hyperglycemia and its complication. Patient reports that his daughter is diabetic and knows how to do that.  Patient agreed to continue on the insulin and is educated. Pt educated on hypoglycemia how to recognize and treat.    CAD/HTN/HLD:Stable with no chest pain. Decreased metoprolol to 25 given bradycardia and decrease Cardura.  Continue Zetia Lipitor and home Plavix  Anemia:hemoglobin stable, status post IV iron infusion, continue ESA per has a right IJ dialysis catheter  Tobacco abusecontinue nicotine patch  Patient is here today for follow-up.  Patient is accompanied by his daughter.  He is currently on Tuesday  Thursday Saturday dialysis.  He has a central line but they are currently using.  He was discharged on rapid acting insulin via sliding scale however he has not been using it as he was confused as to how to administer the insulin and when to administer the insulin.  He is also confused as to the reason his kidneys have shut down and exactly what his disease is.  Both he and his daughter have several questions.  My concern is on physical exam, the patient is tachypneic and seems to be breathing  heavily.  He states that he is always short of breath however it has worsened since he was in the hospital.  He has +1 bipedal edema in his feet and his ankles but this stops just above his ankles.  His lungs are actually clear to auscultation bilaterally today.  I appreciate no evidence of pneumonia or pulmonary edema.  He is not wheezing.  He denies any fever or chest pain or pleurisy.  However he does report shortness of breath with minimal activity.  At that time, my plan was: I spent approximately 30 minutes today with the patient and his daughter explaining his condition.  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  I explained that without his kidneys he will die.  Therefore he will be on dialysis until his kidney function hopefully improves.  I explained that the reason he is taking high-dose prednisone as well as the Rituxan is to suppress his immune system with the hopes that the kidney function will recover.  Unfortunately the prednisone will cause hypoglycemia.  Therefore of asked him to check his blood sugar before meals and before bedtime.  I wrote the patient a sliding scale direction.  He will give 0 units sugars less than 150, 2 units from 1 51-200, 4 units from 201 to 250, 6 units from 2 51-300, 8 units from 301-400, and 10 units greater than 400.  We will recheck via telephone and calculate his total insulin requirement and then make  adjustments in 1 week.  He will follow-up as planned with nephrology.  Given his shortness of breath I will check a d-dimer today and if elevated will send the patient for a VQ scan to evaluate for pulmonary embolism given his prolonged immobilization and tachypnea and shortness of breath.  Addendum from 10/25/2018  Patient's d-dimer returned positive at greater than 4.  However this could possibly be due to his underlying posse immune glomerulonephritis.  Initially I wanted to get a VQ scan to evaluate for pulmonary embolism.  This was ordered stat this morning.  However according to radiology because of the COVID-19 restrictions they are not allowed to perform the ventilation portion of the VQ scan making this test and effective in evaluating for pulmonary embolism.  My pretest probability is intermediate and I am unwilling to risk his renal function with a CT angiogram of the chest.  Therefore I made the decision to obtain stat ultrasounds of his lower extremities to evaluate for any evidence of DVT.  My rationale is that if the patient has a pulmonary embolism it would come from a DVT in his legs and we should see residual clot burden in his legs.  This would not affect his kidney function and we can get this test ordered stat.  I will also perform a chest x-ray to evaluate for any evidence of pneumonia or underlying pulmonary issues.  I suspect that his dyspnea is multifactorial due to his anemia, underlying cardiovascular disease.  Although not ideal, I feel that this is the safest way to rule out pulmonary embolism in an intermediate risk patient if the patient develops chest pain however I would insist on a CT angiogram.  Patient was confirmed to have a chronic DVT in his right leg and given his shortness of breath it was recommended that he go to the hospital for heparin crossover given his chronic kidney disease and hemodialysis dependency until Coumadin reached therapeutic levels.  I have  copied  relevant portions of his discharge summary below for my reference: Admit date: 10/26/2018 Discharge date: 10/27/2018  Time spent: 45 minutes  Recommendations for Outpatient Follow-up:  1. Follow up with PCP 1-2 weeks for evaluation of symptoms. Recommend cbc to track Hg and platelets 2. Dialysis per schedule   Discharge Diagnoses:  Principal Problem:   DVT (deep venous thrombosis) ([name]) Active Problems:   ESRD on dialysis ([place])   Elevated troponin   Hyperlipidemia   Essential hypertension   COPD (chronic obstructive pulmonary disease) ([name])   Type II diabetes mellitus with renal manifestations ([name])   CAD (coronary artery disease)   Pancytopenia ([name])   Hypomagnesemia   TOBACCO ABUSE   GERD (gastroesophageal reflux disease)   History of present illness:  Patient presented 4/29 with shortness of breath for more than 1 week and bilateral legpain andswelling for more than 3 weeks.He stated his right leg worse than the left.Patient denied chest pain, cough, fever or chills. He was seen by PCP 4/29, who suspected DVT and PE. He had LE doppler today showing chronic DVT.V/Q scanswas notdone due to COVID issues.CT angio of lungswas not doneas the hope is that hemay have chance torecover renal function (recently hospitalized with AKI secondary to pauci immune GN on dialysis).Patient denied any nausea vomiting, diarrhea or abdominal pain. Denied dark stool or rectal bleeding. Denied symptoms of UTI or unilateral weakness  Hospital Course:  DVT (deep venous thrombosis) ([place]): LE doppler in PCP's office showedchronic deep vein thrombosis involving the right femoral vein, and right popliteal vein. Since pt also has SOB, cannot completely rule out possibility for PE, therefore he was started on a blood thinner. Pt also with pancytopenia.His baseline hemoglobin is about 8.5 at discharge Hg 8.3. Home meds include plavix prescribed by cardiology 2012. Patient denied dark  stool or rectal bleeding. Treatment options discuss with nephrology vascular and cardiology. Nephrology ok's eliquis '5mg'$ . Vascular ok's stopping plavix from their standpoint. Cardiology stated given high risk of bleeding an antiplatelet is preferred. Recommended asa '81mg'$  with asa and close monitoring of Hg.   Hyperlipidemia: continue lipitor andfenofibrate  Tobacco abuse: Did counseling about importance of quitting smoking  HTN: fair control. Continue home medications:Metoprolol  COPD (chronic obstructive pulmonary disease) ([place]): -dulera inhaler and prn albuterol inhaler  Type II diabetes mellitus with renal manifestations ([name]):Last A1c5.3 on 92/5/19, well controled. Patient is taking novologat home  CADand elevated troponin: :no CP, but trop slightly elevated but flat. In setting of ESRD, possibly due to decreased clearance. Ekg without acute changes.   GERD (gastroesophageal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

## 2018-11-01 ENCOUNTER — Other Ambulatory Visit: Payer: Self-pay

## 2018-11-01 ENCOUNTER — Other Ambulatory Visit: Payer: Medicare HMO

## 2018-11-01 DIAGNOSIS — R0600 Dyspnea, unspecified: Secondary | ICD-10-CM | POA: Diagnosis not present

## 2018-11-01 DIAGNOSIS — N2581 Secondary hyperparathyroidism of renal origin: Secondary | ICD-10-CM | POA: Diagnosis not present

## 2018-11-01 DIAGNOSIS — I82401 Acute embolism and thrombosis of unspecified deep veins of right lower extremity: Secondary | ICD-10-CM | POA: Diagnosis not present

## 2018-11-01 DIAGNOSIS — N059 Unspecified nephritic syndrome with unspecified morphologic changes: Secondary | ICD-10-CM | POA: Diagnosis not present

## 2018-11-01 DIAGNOSIS — N179 Acute kidney failure, unspecified: Secondary | ICD-10-CM | POA: Diagnosis not present

## 2018-11-01 LAB — CBC WITH DIFFERENTIAL/PLATELET
Absolute Monocytes: 120 cells/uL — ABNORMAL LOW (ref 200–950)
Basophils Absolute: 0 cells/uL (ref 0–200)
Basophils Relative: 0 %
Eosinophils Absolute: 9 cells/uL — ABNORMAL LOW (ref 15–500)
Eosinophils Relative: 0.3 %
HCT: 23.5 % — ABNORMAL LOW (ref 38.5–50.0)
Hemoglobin: 7.6 g/dL — ABNORMAL LOW (ref 13.2–17.1)
Lymphs Abs: 267 cells/uL — ABNORMAL LOW (ref 850–3900)
MCH: 30 pg (ref 27.0–33.0)
MCHC: 32.3 g/dL (ref 32.0–36.0)
MCV: 92.9 fL (ref 80.0–100.0)
MPV: 10.4 fL (ref 7.5–12.5)
Monocytes Relative: 4 %
Neutro Abs: 2604 cells/uL (ref 1500–7800)
Neutrophils Relative %: 86.8 %
Platelets: 99 10*3/uL — ABNORMAL LOW (ref 140–400)
RBC: 2.53 10*6/uL — ABNORMAL LOW (ref 4.20–5.80)
RDW: 21.7 % — ABNORMAL HIGH (ref 11.0–15.0)
Total Lymphocyte: 8.9 %
WBC: 3 10*3/uL — ABNORMAL LOW (ref 3.8–10.8)

## 2018-11-01 LAB — CBC MORPHOLOGY

## 2018-11-02 ENCOUNTER — Encounter (HOSPITAL_COMMUNITY): Payer: Self-pay

## 2018-11-02 ENCOUNTER — Encounter (HOSPITAL_COMMUNITY)
Admit: 2018-11-02 | Discharge: 2018-11-02 | Disposition: A | Discharge status: Home / Self Care | Payer: Medicare HMO | Attending: Nephrology | Admitting: Nephrology

## 2018-11-02 ENCOUNTER — Telehealth: Payer: Self-pay | Admitting: Internal Medicine

## 2018-11-02 ENCOUNTER — Other Ambulatory Visit: Payer: Self-pay

## 2018-11-02 DIAGNOSIS — K219 Gastro-esophageal reflux disease without esophagitis: Secondary | ICD-10-CM | POA: Diagnosis present

## 2018-11-02 DIAGNOSIS — R0602 Shortness of breath: Secondary | ICD-10-CM | POA: Diagnosis present

## 2018-11-02 DIAGNOSIS — E1136 Type 2 diabetes mellitus with diabetic cataract: Secondary | ICD-10-CM | POA: Diagnosis present

## 2018-11-02 DIAGNOSIS — K3189 Other diseases of stomach and duodenum: Secondary | ICD-10-CM | POA: Diagnosis not present

## 2018-11-02 DIAGNOSIS — E1121 Type 2 diabetes mellitus with diabetic nephropathy: Secondary | ICD-10-CM | POA: Diagnosis not present

## 2018-11-02 DIAGNOSIS — E1151 Type 2 diabetes mellitus with diabetic peripheral angiopathy without gangrene: Secondary | ICD-10-CM | POA: Diagnosis present

## 2018-11-02 DIAGNOSIS — K2970 Gastritis, unspecified, without bleeding: Secondary | ICD-10-CM | POA: Diagnosis not present

## 2018-11-02 DIAGNOSIS — J90 Pleural effusion, not elsewhere classified: Secondary | ICD-10-CM | POA: Diagnosis not present

## 2018-11-02 DIAGNOSIS — K31811 Angiodysplasia of stomach and duodenum with bleeding: Secondary | ICD-10-CM | POA: Diagnosis present

## 2018-11-02 DIAGNOSIS — N186 End stage renal disease: Secondary | ICD-10-CM | POA: Diagnosis present

## 2018-11-02 DIAGNOSIS — E1122 Type 2 diabetes mellitus with diabetic chronic kidney disease: Secondary | ICD-10-CM | POA: Diagnosis present

## 2018-11-02 DIAGNOSIS — I2510 Atherosclerotic heart disease of native coronary artery without angina pectoris: Secondary | ICD-10-CM | POA: Diagnosis present

## 2018-11-02 DIAGNOSIS — H9190 Unspecified hearing loss, unspecified ear: Secondary | ICD-10-CM | POA: Diagnosis present

## 2018-11-02 DIAGNOSIS — E785 Hyperlipidemia, unspecified: Secondary | ICD-10-CM | POA: Diagnosis present

## 2018-11-02 DIAGNOSIS — K31819 Angiodysplasia of stomach and duodenum without bleeding: Secondary | ICD-10-CM | POA: Diagnosis not present

## 2018-11-02 DIAGNOSIS — N058 Unspecified nephritic syndrome with other morphologic changes: Secondary | ICD-10-CM | POA: Diagnosis not present

## 2018-11-02 DIAGNOSIS — Z7951 Long term (current) use of inhaled steroids: Secondary | ICD-10-CM | POA: Diagnosis not present

## 2018-11-02 DIAGNOSIS — K259 Gastric ulcer, unspecified as acute or chronic, without hemorrhage or perforation: Secondary | ICD-10-CM | POA: Diagnosis not present

## 2018-11-02 DIAGNOSIS — I4821 Permanent atrial fibrillation: Secondary | ICD-10-CM | POA: Diagnosis present

## 2018-11-02 DIAGNOSIS — Z8551 Personal history of malignant neoplasm of bladder: Secondary | ICD-10-CM | POA: Diagnosis not present

## 2018-11-02 DIAGNOSIS — Z7982 Long term (current) use of aspirin: Secondary | ICD-10-CM | POA: Diagnosis not present

## 2018-11-02 DIAGNOSIS — K921 Melena: Secondary | ICD-10-CM | POA: Diagnosis not present

## 2018-11-02 DIAGNOSIS — D649 Anemia, unspecified: Secondary | ICD-10-CM | POA: Diagnosis not present

## 2018-11-02 DIAGNOSIS — Z8249 Family history of ischemic heart disease and other diseases of the circulatory system: Secondary | ICD-10-CM | POA: Diagnosis not present

## 2018-11-02 DIAGNOSIS — N2581 Secondary hyperparathyroidism of renal origin: Secondary | ICD-10-CM | POA: Diagnosis present

## 2018-11-02 DIAGNOSIS — J439 Emphysema, unspecified: Secondary | ICD-10-CM | POA: Diagnosis not present

## 2018-11-02 DIAGNOSIS — J849 Interstitial pulmonary disease, unspecified: Secondary | ICD-10-CM | POA: Diagnosis present

## 2018-11-02 DIAGNOSIS — M1990 Unspecified osteoarthritis, unspecified site: Secondary | ICD-10-CM | POA: Diagnosis present

## 2018-11-02 DIAGNOSIS — D509 Iron deficiency anemia, unspecified: Secondary | ICD-10-CM | POA: Diagnosis not present

## 2018-11-02 DIAGNOSIS — R042 Hemoptysis: Secondary | ICD-10-CM | POA: Diagnosis not present

## 2018-11-02 DIAGNOSIS — F17200 Nicotine dependence, unspecified, uncomplicated: Secondary | ICD-10-CM | POA: Diagnosis not present

## 2018-11-02 DIAGNOSIS — Z992 Dependence on renal dialysis: Secondary | ICD-10-CM | POA: Diagnosis not present

## 2018-11-02 DIAGNOSIS — J449 Chronic obstructive pulmonary disease, unspecified: Secondary | ICD-10-CM | POA: Diagnosis present

## 2018-11-02 DIAGNOSIS — D631 Anemia in chronic kidney disease: Secondary | ICD-10-CM | POA: Diagnosis not present

## 2018-11-02 DIAGNOSIS — I10 Essential (primary) hypertension: Secondary | ICD-10-CM | POA: Diagnosis not present

## 2018-11-02 DIAGNOSIS — I129 Hypertensive chronic kidney disease with stage 1 through stage 4 chronic kidney disease, or unspecified chronic kidney disease: Secondary | ICD-10-CM | POA: Diagnosis not present

## 2018-11-02 DIAGNOSIS — N189 Chronic kidney disease, unspecified: Secondary | ICD-10-CM | POA: Diagnosis not present

## 2018-11-02 DIAGNOSIS — K210 Gastro-esophageal reflux disease with esophagitis: Secondary | ICD-10-CM | POA: Diagnosis not present

## 2018-11-02 DIAGNOSIS — D61818 Other pancytopenia: Secondary | ICD-10-CM | POA: Diagnosis present

## 2018-11-02 DIAGNOSIS — D696 Thrombocytopenia, unspecified: Secondary | ICD-10-CM | POA: Diagnosis not present

## 2018-11-02 DIAGNOSIS — Z794 Long term (current) use of insulin: Secondary | ICD-10-CM | POA: Diagnosis not present

## 2018-11-02 DIAGNOSIS — N057 Unspecified nephritic syndrome with diffuse crescentic glomerulonephritis: Secondary | ICD-10-CM | POA: Diagnosis not present

## 2018-11-02 DIAGNOSIS — N179 Acute kidney failure, unspecified: Secondary | ICD-10-CM | POA: Diagnosis present

## 2018-11-02 DIAGNOSIS — E1129 Type 2 diabetes mellitus with other diabetic kidney complication: Secondary | ICD-10-CM | POA: Diagnosis not present

## 2018-11-02 DIAGNOSIS — I120 Hypertensive chronic kidney disease with stage 5 chronic kidney disease or end stage renal disease: Secondary | ICD-10-CM | POA: Diagnosis present

## 2018-11-02 DIAGNOSIS — Z1159 Encounter for screening for other viral diseases: Secondary | ICD-10-CM | POA: Diagnosis not present

## 2018-11-02 DIAGNOSIS — R911 Solitary pulmonary nodule: Secondary | ICD-10-CM | POA: Diagnosis not present

## 2018-11-02 LAB — COMPREHENSIVE METABOLIC PANEL
ALT: 30 U/L (ref 0–44)
AST: 33 U/L (ref 15–41)
Albumin: 1.9 g/dL — ABNORMAL LOW (ref 3.5–5.0)
Alkaline Phosphatase: 43 U/L (ref 38–126)
Anion gap: 9 (ref 5–15)
BUN: 62 mg/dL — ABNORMAL HIGH (ref 8–23)
CO2: 26 mmol/L (ref 22–32)
Calcium: 7.7 mg/dL — ABNORMAL LOW (ref 8.9–10.3)
Chloride: 103 mmol/L (ref 98–111)
Creatinine, Ser: 3.85 mg/dL — ABNORMAL HIGH (ref 0.61–1.24)
GFR calc Af Amer: 17 mL/min — ABNORMAL LOW (ref >60)
GFR calc non Af Amer: 14 mL/min — ABNORMAL LOW (ref >60)
Glucose, Bld: 194 mg/dL — ABNORMAL HIGH (ref 70–99)
Potassium: 4.7 mmol/L (ref 3.5–5.1)
Sodium: 138 mmol/L (ref 135–145)
Total Bilirubin: 0.8 mg/dL (ref 0.3–1.2)
Total Protein: 4.7 g/dL — ABNORMAL LOW (ref 6.5–8.1)

## 2018-11-02 MED ORDER — METHYLPREDNISOLONE SODIUM SUCC 125 MG IJ SOLR
125.0000 mg | Freq: Once | INTRAMUSCULAR | Status: AC
  Administered 2018-11-02: 125 mg via INTRAVENOUS

## 2018-11-02 MED ORDER — ACETAMINOPHEN 325 MG PO TABS
650.0000 mg | ORAL_TABLET | Freq: Once | ORAL | Status: AC
  Administered 2018-11-02: 650 mg via ORAL

## 2018-11-02 MED ORDER — SODIUM CHLORIDE 0.9 % IV SOLN
1000.0000 mg | Freq: Once | INTRAVENOUS | Status: DC
  Filled 2018-11-02: qty 100

## 2018-11-02 MED ORDER — SODIUM CHLORIDE 0.9 % IV SOLN
1000.0000 mg | Freq: Once | INTRAVENOUS | Status: AC
  Administered 2018-11-02: 1000 mg via INTRAVENOUS
  Filled 2018-11-02: qty 100

## 2018-11-02 MED ORDER — SODIUM CHLORIDE 0.9 % IV SOLN
Freq: Once | INTRAVENOUS | Status: AC
  Administered 2018-11-02: 09:00:00 via INTRAVENOUS

## 2018-11-02 MED ORDER — DIPHENHYDRAMINE HCL 50 MG/ML IJ SOLN
25.0000 mg | Freq: Once | INTRAMUSCULAR | Status: AC
  Administered 2018-11-02: 25 mg via INTRAVENOUS

## 2018-11-02 NOTE — Telephone Encounter (Signed)
Pt. Does not have smartphone. He did give verbal  consent to do a phone visit.

## 2018-11-02 NOTE — Telephone Encounter (Signed)
Virtual Visit Pre-Appointment Phone Call  "(Name), I am calling you today to discuss your upcoming appointment. We are currently trying to limit exposure to the virus that causes COVID-19 by seeing patients at home rather than in the office."  1. "What is the BEST phone number to call the day of the visit?" - include this in appointment notes  2. Do you have or have access to (through a family member/friend) a smartphone with video capability that we can use for your visit?" a. If yes - list this number in appt notes as cell (if different from BEST phone #) and list the appointment type as a VIDEO visit in appointment notes b. If no - list the appointment type as a PHONE visit in appointment notes  3. Confirm consent - "In the setting of the current Covid19 crisis, you are scheduled for a (phone or video) visit with your provider on (date) at (time).  Just as we do with many in-office visits, in order for you to participate in this visit, we must obtain consent.  If you'd like, I can send this to your mychart (if signed up) or email for you to review.  Otherwise, I can obtain your verbal consent now.  All virtual visits are billed to your insurance company just like a normal visit would be.  By agreeing to a virtual visit, we'd like you to understand that the technology does not allow for your provider to perform an examination, and thus may limit your provider's ability to fully assess your condition. If your provider identifies any concerns that need to be evaluated in person, we will make arrangements to do so.  Finally, though the technology is pretty good, we cannot assure that it will always work on either your or our end, and in the setting of a video visit, we may have to convert it to a phone-only visit.  In either situation, we cannot ensure that we have a secure connection.  Are you willing to proceed?" YES  4. Advise patient to be prepared - "Two hours prior to your appointment, go  ahead and check your blood pressure, pulse, oxygen saturation, and your weight (if you have the equipment to check those) and write them all down. When your visit starts, your provider will ask you for this information. If you have an Apple Watch or Kardia device, please plan to have heart rate information ready on the day of your appointment. Please have a pen and paper handy nearby the day of the visit as well."  5. Give patient instructions for MyChart download to smartphone OR Doximity/Doxy.me as below if video visit (depending on what platform provider is using)  6. Inform patient they will receive a phone call 15 minutes prior to their appointment time (may be from unknown caller ID) so they should be prepared to answer    TELEPHONE CALL NOTE  [name] Radi. has been deemed a candidate for a follow-up tele-health visit to limit community exposure during the Covid-19 pandemic. I spoke with the patient via phone to ensure availability of phone/video source, confirm preferred email & phone number, and discuss instructions and expectations.  I reminded [name]. to be prepared with any vital sign and/or heart rhythm information that could potentially be obtained via home monitoring, at the time of his visit. I reminded [name]. to expect a phone call prior to his visit.  [name] 11/02/2018 4:58 PM   INSTRUCTIONS FOR DOWNLOADING  THE MYCHART APP TO SMARTPHONE  - The patient must first make sure to have activated MyChart and know their login information - If Apple, go to [employer] and type in MyChart in the search bar and download the app. If Android, ask patient to go to [employer] and type in [place] in the search bar and download the app. The app is free but as with any other app downloads, their phone may require them to verify saved payment information or Apple/Android password.  - The patient will need to then log into the app with their MyChart username  and password, and select [facility] as their healthcare provider to link the account. When it is time for your visit, go to the MyChart app, find appointments, and click Begin Video Visit. Be sure to Select Allow for your device to access the Microphone and Camera for your visit. You will then be connected, and your provider will be with you shortly.  **If they have any issues connecting, or need assistance please contact MyChart service desk [phone])**  **If using a computer, in order to ensure the best quality for their visit they will need to use either of the following Internet Browsers: [employer], or Google Chrome**  IF USING DOXIMITY or DOXY.ME - The patient will receive a link just prior to their visit by text.     FULL LENGTH CONSENT FOR TELE-HEALTH VISIT   I hereby voluntarily request, consent and authorize [facility] and its employed or contracted physicians, physician assistants, nurse practitioners or other licensed health care professionals (the Practitioner), to provide me with telemedicine health care services (the Services") as deemed necessary by the treating Practitioner. I acknowledge and consent to receive the Services by the Practitioner via telemedicine. I understand that the telemedicine visit will involve communicating with the Practitioner through live audiovisual communication technology and the disclosure of certain medical information by electronic transmission. I acknowledge that I have been given the opportunity to request an in-person assessment or other available alternative prior to the telemedicine visit and am voluntarily participating in the telemedicine visit.  I understand that I have the right to withhold or withdraw my consent to the use of telemedicine in the course of my care at any time, without affecting my right to future care or treatment, and that the Practitioner or I may terminate the telemedicine visit at any time. I  understand that I have the right to inspect all information obtained and/or recorded in the course of the telemedicine visit and may receive copies of available information for a reasonable fee.  I understand that some of the potential risks of receiving the Services via telemedicine include:   Delay or interruption in medical evaluation due to technological equipment failure or disruption;  Information transmitted may not be sufficient (e.g. poor resolution of images) to allow for appropriate medical decision making by the Practitioner; and/or   In rare instances, security protocols could fail, causing a breach of personal health information.  Furthermore, I acknowledge that it is my responsibility to provide information about my medical history, conditions and care that is complete and accurate to the best of my ability. I acknowledge that Practitioner's advice, recommendations, and/or decision may be based on factors not within their control, such as incomplete or inaccurate data provided by me or distortions of diagnostic images or specimens that may result from electronic transmissions. I understand that the practice of medicine is not an exact science and that Practitioner  makes no warranties or guarantees regarding treatment outcomes. I acknowledge that I will receive a copy of this consent concurrently upon execution via email to the email address I last provided but may also request a printed copy by calling the office of [facility].    I understand that my insurance will be billed for this visit.   I have read or had this consent read to me.  I understand the contents of this consent, which adequately explains the benefits and risks of the Services being provided via telemedicine.   I have been provided ample opportunity to ask questions regarding this consent and the Services and have had my questions answered to my satisfaction.  I give my informed consent for the services to be  provided through the use of telemedicine in my medical care  By participating in this telemedicine visit I agree to the above.

## 2018-11-03 ENCOUNTER — Other Ambulatory Visit: Payer: Self-pay | Admitting: Family Medicine

## 2018-11-03 DIAGNOSIS — N179 Acute kidney failure, unspecified: Secondary | ICD-10-CM | POA: Diagnosis not present

## 2018-11-03 DIAGNOSIS — N2581 Secondary hyperparathyroidism of renal origin: Secondary | ICD-10-CM | POA: Diagnosis not present

## 2018-11-03 DIAGNOSIS — D649 Anemia, unspecified: Secondary | ICD-10-CM

## 2018-11-03 DIAGNOSIS — N059 Unspecified nephritic syndrome with unspecified morphologic changes: Secondary | ICD-10-CM

## 2018-11-04 ENCOUNTER — Other Ambulatory Visit: Payer: Self-pay

## 2018-11-04 ENCOUNTER — Inpatient Hospital Stay (HOSPITAL_COMMUNITY)
Admission: EM | Admit: 2018-11-04 | Discharge: 2018-11-09 | DRG: 377 | Disposition: A | Discharge status: Home / Self Care | Payer: Medicare HMO | Attending: Student | Admitting: Student

## 2018-11-04 ENCOUNTER — Other Ambulatory Visit: Payer: Medicare HMO

## 2018-11-04 ENCOUNTER — Emergency Department (HOSPITAL_COMMUNITY): Payer: Medicare HMO

## 2018-11-04 ENCOUNTER — Encounter (HOSPITAL_COMMUNITY): Payer: Self-pay

## 2018-11-04 DIAGNOSIS — R911 Solitary pulmonary nodule: Secondary | ICD-10-CM | POA: Diagnosis present

## 2018-11-04 DIAGNOSIS — R042 Hemoptysis: Secondary | ICD-10-CM

## 2018-11-04 DIAGNOSIS — I129 Hypertensive chronic kidney disease with stage 1 through stage 4 chronic kidney disease, or unspecified chronic kidney disease: Secondary | ICD-10-CM | POA: Diagnosis not present

## 2018-11-04 DIAGNOSIS — E1129 Type 2 diabetes mellitus with other diabetic kidney complication: Secondary | ICD-10-CM | POA: Diagnosis present

## 2018-11-04 DIAGNOSIS — Z7982 Long term (current) use of aspirin: Secondary | ICD-10-CM

## 2018-11-04 DIAGNOSIS — E785 Hyperlipidemia, unspecified: Secondary | ICD-10-CM | POA: Diagnosis present

## 2018-11-04 DIAGNOSIS — D61818 Other pancytopenia: Secondary | ICD-10-CM | POA: Diagnosis present

## 2018-11-04 DIAGNOSIS — I776 Arteritis, unspecified: Secondary | ICD-10-CM | POA: Diagnosis present

## 2018-11-04 DIAGNOSIS — M1990 Unspecified osteoarthritis, unspecified site: Secondary | ICD-10-CM | POA: Diagnosis present

## 2018-11-04 DIAGNOSIS — E1151 Type 2 diabetes mellitus with diabetic peripheral angiopathy without gangrene: Secondary | ICD-10-CM | POA: Diagnosis present

## 2018-11-04 DIAGNOSIS — R0602 Shortness of breath: Secondary | ICD-10-CM

## 2018-11-04 DIAGNOSIS — I4821 Permanent atrial fibrillation: Secondary | ICD-10-CM | POA: Diagnosis present

## 2018-11-04 DIAGNOSIS — J449 Chronic obstructive pulmonary disease, unspecified: Secondary | ICD-10-CM | POA: Diagnosis present

## 2018-11-04 DIAGNOSIS — Z1159 Encounter for screening for other viral diseases: Secondary | ICD-10-CM

## 2018-11-04 DIAGNOSIS — I2510 Atherosclerotic heart disease of native coronary artery without angina pectoris: Secondary | ICD-10-CM | POA: Diagnosis present

## 2018-11-04 DIAGNOSIS — Z7951 Long term (current) use of inhaled steroids: Secondary | ICD-10-CM

## 2018-11-04 DIAGNOSIS — N179 Acute kidney failure, unspecified: Secondary | ICD-10-CM | POA: Diagnosis present

## 2018-11-04 DIAGNOSIS — K219 Gastro-esophageal reflux disease without esophagitis: Secondary | ICD-10-CM | POA: Diagnosis present

## 2018-11-04 DIAGNOSIS — I959 Hypotension, unspecified: Secondary | ICD-10-CM | POA: Diagnosis not present

## 2018-11-04 DIAGNOSIS — D631 Anemia in chronic kidney disease: Secondary | ICD-10-CM | POA: Diagnosis present

## 2018-11-04 DIAGNOSIS — J849 Interstitial pulmonary disease, unspecified: Secondary | ICD-10-CM

## 2018-11-04 DIAGNOSIS — Z888 Allergy status to other drugs, medicaments and biological substances status: Secondary | ICD-10-CM

## 2018-11-04 DIAGNOSIS — D649 Anemia, unspecified: Secondary | ICD-10-CM

## 2018-11-04 DIAGNOSIS — H9190 Unspecified hearing loss, unspecified ear: Secondary | ICD-10-CM | POA: Diagnosis present

## 2018-11-04 DIAGNOSIS — N057 Unspecified nephritic syndrome with diffuse crescentic glomerulonephritis: Secondary | ICD-10-CM | POA: Diagnosis not present

## 2018-11-04 DIAGNOSIS — F17200 Nicotine dependence, unspecified, uncomplicated: Secondary | ICD-10-CM | POA: Diagnosis present

## 2018-11-04 DIAGNOSIS — E1122 Type 2 diabetes mellitus with diabetic chronic kidney disease: Secondary | ICD-10-CM | POA: Diagnosis present

## 2018-11-04 DIAGNOSIS — N2581 Secondary hyperparathyroidism of renal origin: Secondary | ICD-10-CM | POA: Diagnosis present

## 2018-11-04 DIAGNOSIS — K922 Gastrointestinal hemorrhage, unspecified: Secondary | ICD-10-CM | POA: Diagnosis present

## 2018-11-04 DIAGNOSIS — N189 Chronic kidney disease, unspecified: Secondary | ICD-10-CM | POA: Diagnosis not present

## 2018-11-04 DIAGNOSIS — E875 Hyperkalemia: Secondary | ICD-10-CM | POA: Diagnosis not present

## 2018-11-04 DIAGNOSIS — Z8551 Personal history of malignant neoplasm of bladder: Secondary | ICD-10-CM | POA: Diagnosis not present

## 2018-11-04 DIAGNOSIS — K2970 Gastritis, unspecified, without bleeding: Secondary | ICD-10-CM | POA: Diagnosis present

## 2018-11-04 DIAGNOSIS — Z91013 Allergy to seafood: Secondary | ICD-10-CM

## 2018-11-04 DIAGNOSIS — Z87891 Personal history of nicotine dependence: Secondary | ICD-10-CM

## 2018-11-04 DIAGNOSIS — H5461 Unqualified visual loss, right eye, normal vision left eye: Secondary | ICD-10-CM | POA: Diagnosis present

## 2018-11-04 DIAGNOSIS — I120 Hypertensive chronic kidney disease with stage 5 chronic kidney disease or end stage renal disease: Secondary | ICD-10-CM | POA: Diagnosis present

## 2018-11-04 DIAGNOSIS — Z885 Allergy status to narcotic agent status: Secondary | ICD-10-CM

## 2018-11-04 DIAGNOSIS — Z794 Long term (current) use of insulin: Secondary | ICD-10-CM

## 2018-11-04 DIAGNOSIS — D500 Iron deficiency anemia secondary to blood loss (chronic): Secondary | ICD-10-CM | POA: Diagnosis present

## 2018-11-04 DIAGNOSIS — Z833 Family history of diabetes mellitus: Secondary | ICD-10-CM

## 2018-11-04 DIAGNOSIS — N058 Unspecified nephritic syndrome with other morphologic changes: Secondary | ICD-10-CM | POA: Diagnosis not present

## 2018-11-04 DIAGNOSIS — Z8249 Family history of ischemic heart disease and other diseases of the circulatory system: Secondary | ICD-10-CM

## 2018-11-04 DIAGNOSIS — Z91018 Allergy to other foods: Secondary | ICD-10-CM

## 2018-11-04 DIAGNOSIS — N186 End stage renal disease: Secondary | ICD-10-CM

## 2018-11-04 DIAGNOSIS — E1136 Type 2 diabetes mellitus with diabetic cataract: Secondary | ICD-10-CM | POA: Diagnosis present

## 2018-11-04 DIAGNOSIS — I10 Essential (primary) hypertension: Secondary | ICD-10-CM | POA: Diagnosis present

## 2018-11-04 DIAGNOSIS — K31811 Angiodysplasia of stomach and duodenum with bleeding: Principal | ICD-10-CM | POA: Diagnosis present

## 2018-11-04 DIAGNOSIS — M898X9 Other specified disorders of bone, unspecified site: Secondary | ICD-10-CM | POA: Diagnosis present

## 2018-11-04 DIAGNOSIS — Z992 Dependence on renal dialysis: Secondary | ICD-10-CM

## 2018-11-04 HISTORY — DX: Anemia, unspecified: D64.9

## 2018-11-04 LAB — CBC WITH DIFFERENTIAL/PLATELET
Abs Immature Granulocytes: 0 10*3/uL (ref 0.00–0.07)
Basophils Absolute: 0 10*3/uL (ref 0.0–0.1)
Basophils Relative: 0 %
Eosinophils Absolute: 0 10*3/uL (ref 0.0–0.5)
Eosinophils Relative: 0 %
HCT: 20.9 % — ABNORMAL LOW (ref 39.0–52.0)
Hemoglobin: 6.4 g/dL — CL (ref 13.0–17.0)
Lymphocytes Relative: 11 %
Lymphs Abs: 0.3 10*3/uL — ABNORMAL LOW (ref 0.7–4.0)
MCH: 30.2 pg (ref 26.0–34.0)
MCHC: 30.6 g/dL (ref 30.0–36.0)
MCV: 98.6 fL (ref 80.0–100.0)
Monocytes Absolute: 0 10*3/uL — ABNORMAL LOW (ref 0.1–1.0)
Monocytes Relative: 1 %
Neutro Abs: 2 10*3/uL (ref 1.7–7.7)
Neutrophils Relative %: 88 %
Platelets: 99 10*3/uL — ABNORMAL LOW (ref 150–400)
RBC: 2.12 MIL/uL — ABNORMAL LOW (ref 4.22–5.81)
RDW: 25.7 % — ABNORMAL HIGH (ref 11.5–15.5)
WBC: 2.3 10*3/uL — ABNORMAL LOW (ref 4.0–10.5)
nRBC: 0 % (ref 0.0–0.2)

## 2018-11-04 LAB — COMPREHENSIVE METABOLIC PANEL
ALT: 29 U/L (ref 0–44)
AST: 37 U/L (ref 15–41)
Albumin: 2 g/dL — ABNORMAL LOW (ref 3.5–5.0)
Alkaline Phosphatase: 42 U/L (ref 38–126)
Anion gap: 13 (ref 5–15)
BUN: 59 mg/dL — ABNORMAL HIGH (ref 8–23)
CO2: 24 mmol/L (ref 22–32)
Calcium: 7.5 mg/dL — ABNORMAL LOW (ref 8.9–10.3)
Chloride: 104 mmol/L (ref 98–111)
Creatinine, Ser: 4.08 mg/dL — ABNORMAL HIGH (ref 0.61–1.24)
GFR calc Af Amer: 16 mL/min — ABNORMAL LOW (ref >60)
GFR calc non Af Amer: 13 mL/min — ABNORMAL LOW (ref >60)
Glucose, Bld: 247 mg/dL — ABNORMAL HIGH (ref 70–99)
Potassium: 4.5 mmol/L (ref 3.5–5.1)
Sodium: 141 mmol/L (ref 135–145)
Total Bilirubin: 0.9 mg/dL (ref 0.3–1.2)
Total Protein: 4.6 g/dL — ABNORMAL LOW (ref 6.5–8.1)

## 2018-11-04 LAB — PREPARE RBC (CROSSMATCH)

## 2018-11-04 LAB — GLUCOSE, CAPILLARY: Glucose-Capillary: 140 mg/dL — ABNORMAL HIGH (ref 70–99)

## 2018-11-04 LAB — TROPONIN I: Troponin I: 0.04 ng/mL (ref <0.03)

## 2018-11-04 LAB — HEMOGLOBIN AND HEMATOCRIT, BLOOD
HCT: 21.5 % — ABNORMAL LOW (ref 39.0–52.0)
Hemoglobin: 6.7 g/dL — CL (ref 13.0–17.0)

## 2018-11-04 MED ORDER — CALCIUM ACETATE (PHOS BINDER) 667 MG PO CAPS
667.0000 mg | ORAL_CAPSULE | Freq: Three times a day (TID) | ORAL | Status: DC
  Administered 2018-11-05 – 2018-11-09 (×9): 667 mg via ORAL
  Filled 2018-11-04 (×10): qty 1

## 2018-11-04 MED ORDER — ASPIRIN 81 MG PO CHEW
81.0000 mg | CHEWABLE_TABLET | Freq: Every day | ORAL | Status: DC
  Administered 2018-11-05 – 2018-11-06 (×2): 81 mg via ORAL
  Filled 2018-11-04 (×3): qty 1

## 2018-11-04 MED ORDER — METOPROLOL SUCCINATE ER 25 MG PO TB24
25.0000 mg | ORAL_TABLET | Freq: Every day | ORAL | Status: DC
  Administered 2018-11-06 – 2018-11-09 (×4): 25 mg via ORAL
  Filled 2018-11-04 (×5): qty 1

## 2018-11-04 MED ORDER — EPINEPHRINE 0.3 MG/0.3ML IJ SOAJ: 0.3000 mg | Freq: Once | INTRAMUSCULAR | Status: DC | PRN

## 2018-11-04 MED ORDER — DOCUSATE SODIUM 100 MG PO CAPS
100.0000 mg | ORAL_CAPSULE | Freq: Every day | ORAL | Status: DC
  Administered 2018-11-05 – 2018-11-09 (×5): 100 mg via ORAL
  Filled 2018-11-04 (×6): qty 1

## 2018-11-04 MED ORDER — OXYCODONE-ACETAMINOPHEN 5-325 MG PO TABS
1.0000 | ORAL_TABLET | ORAL | Status: DC | PRN
  Administered 2018-11-04 – 2018-11-08 (×5): 1 via ORAL
  Filled 2018-11-04 (×5): qty 1

## 2018-11-04 MED ORDER — SODIUM CHLORIDE 0.9 % IV SOLN: 10.0000 mL/h | Freq: Once | INTRAVENOUS | Status: DC

## 2018-11-04 MED ORDER — ONDANSETRON HCL 4 MG PO TABS: 4.0000 mg | ORAL_TABLET | Freq: Four times a day (QID) | ORAL | Status: DC | PRN

## 2018-11-04 MED ORDER — ONDANSETRON HCL 4 MG/2ML IJ SOLN: 4.0000 mg | Freq: Four times a day (QID) | INTRAMUSCULAR | Status: DC | PRN

## 2018-11-04 MED ORDER — PANTOPRAZOLE SODIUM 40 MG PO TBEC
40.0000 mg | DELAYED_RELEASE_TABLET | Freq: Every day | ORAL | Status: DC
  Administered 2018-11-05 – 2018-11-09 (×5): 40 mg via ORAL
  Filled 2018-11-04 (×6): qty 1

## 2018-11-04 MED ORDER — OXYCODONE HCL 5 MG PO TABS
5.0000 mg | ORAL_TABLET | ORAL | Status: DC | PRN
  Administered 2018-11-04 – 2018-11-08 (×4): 5 mg via ORAL
  Filled 2018-11-04 (×4): qty 1

## 2018-11-04 MED ORDER — EZETIMIBE 10 MG PO TABS
10.0000 mg | ORAL_TABLET | Freq: Every day | ORAL | Status: DC
  Administered 2018-11-05 – 2018-11-09 (×5): 10 mg via ORAL
  Filled 2018-11-04 (×6): qty 1

## 2018-11-04 MED ORDER — APIXABAN 5 MG PO TABS
5.0000 mg | ORAL_TABLET | Freq: Two times a day (BID) | ORAL | Status: DC
  Filled 2018-11-04: qty 1

## 2018-11-04 MED ORDER — PREDNISONE 50 MG PO TABS
60.0000 mg | ORAL_TABLET | Freq: Every day | ORAL | Status: AC
  Administered 2018-11-05 – 2018-11-08 (×3): 60 mg via ORAL
  Filled 2018-11-04 (×5): qty 1

## 2018-11-04 MED ORDER — PREDNISONE 20 MG PO TABS: 40.0000 mg | ORAL_TABLET | Freq: Every day | ORAL | Status: DC

## 2018-11-04 MED ORDER — INSULIN GLARGINE 100 UNIT/ML ~~LOC~~ SOLN
7.0000 [IU] | Freq: Every day | SUBCUTANEOUS | Status: DC
  Administered 2018-11-05 – 2018-11-09 (×4): 7 [IU] via SUBCUTANEOUS
  Filled 2018-11-04 (×5): qty 0.07

## 2018-11-04 MED ORDER — PREDNISONE 50 MG PO TABS
50.0000 mg | ORAL_TABLET | Freq: Every day | ORAL | Status: DC
  Administered 2018-11-09: 50 mg via ORAL
  Filled 2018-11-04 (×3): qty 1

## 2018-11-04 MED ORDER — LORAZEPAM 2 MG/ML IJ SOLN
0.5000 mg | Freq: Once | INTRAMUSCULAR | Status: AC
  Administered 2018-11-04: 0.5 mg via INTRAVENOUS
  Filled 2018-11-04: qty 1

## 2018-11-04 MED ORDER — RAMELTEON 8 MG PO TABS
8.0000 mg | ORAL_TABLET | Freq: Once | ORAL | Status: AC
  Administered 2018-11-04: 8 mg via ORAL
  Filled 2018-11-04: qty 1

## 2018-11-04 MED ORDER — OXYCODONE-ACETAMINOPHEN 10-325 MG PO TABS: 1.0000 | ORAL_TABLET | ORAL | Status: DC | PRN

## 2018-11-04 MED ORDER — ATORVASTATIN CALCIUM 80 MG PO TABS
80.0000 mg | ORAL_TABLET | Freq: Every day | ORAL | Status: DC
  Administered 2018-11-04 – 2018-11-08 (×5): 80 mg via ORAL
  Filled 2018-11-04 (×5): qty 1

## 2018-11-04 MED ORDER — SODIUM CHLORIDE 0.9% IV SOLUTION: Freq: Once | INTRAVENOUS | Status: DC

## 2018-11-04 MED ORDER — INSULIN ASPART 100 UNIT/ML ~~LOC~~ SOLN: 0.0000 [IU] | Freq: Every day | SUBCUTANEOUS | Status: DC

## 2018-11-04 MED ORDER — PREDNISONE 20 MG PO TABS: 30.0000 mg | ORAL_TABLET | Freq: Every day | ORAL | Status: DC

## 2018-11-04 MED ORDER — PREDNISONE 20 MG PO TABS: 20.0000 mg | ORAL_TABLET | Freq: Every day | ORAL | Status: DC

## 2018-11-04 MED ORDER — PREDNISONE 20 MG PO TABS: 20.0000 mg | ORAL_TABLET | ORAL | Status: DC

## 2018-11-04 MED ORDER — CHLORHEXIDINE GLUCONATE CLOTH 2 % EX PADS
6.0000 | MEDICATED_PAD | Freq: Every day | CUTANEOUS | Status: DC
  Administered 2018-11-05 – 2018-11-09 (×5): 6 via TOPICAL

## 2018-11-04 MED ORDER — INSULIN ASPART 100 UNIT/ML ~~LOC~~ SOLN
0.0000 [IU] | Freq: Three times a day (TID) | SUBCUTANEOUS | Status: DC
  Administered 2018-11-05: 2 [IU] via SUBCUTANEOUS
  Administered 2018-11-06: 9 [IU] via SUBCUTANEOUS
  Administered 2018-11-08: 2 [IU] via SUBCUTANEOUS
  Administered 2018-11-08: 1 [IU] via SUBCUTANEOUS
  Administered 2018-11-09: 2 [IU] via SUBCUTANEOUS

## 2018-11-04 MED ORDER — FERROUS SULFATE 325 (65 FE) MG PO TABS
325.0000 mg | ORAL_TABLET | Freq: Every day | ORAL | Status: DC
  Administered 2018-11-05 – 2018-11-09 (×5): 325 mg via ORAL
  Filled 2018-11-04 (×6): qty 1

## 2018-11-04 NOTE — ED Notes (Signed)
Troponin 0.04; Notified Dr. Sherry Ruffing.

## 2018-11-04 NOTE — ED Notes (Signed)
Report given to 3E RN. All questions answered 
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
## 2018-11-04 NOTE — ED Triage Notes (Signed)
Pt arrives POV from home; pt reports going to PCP for labs, PCP called him this afternoon and told him to come to the ED for blood transfusion due to low hemoglobin.

## 2018-11-04 NOTE — ED Notes (Signed)
ED Provider at bedside. 

## 2018-11-04 NOTE — Progress Notes (Addendum)
Patient arrived to unit with one unit of blood already running.  Complaints of generalized chronic pain. VSS.  Patient placed on telemetry and verified by NT, Megan.  Patient given call bell.  Allergy and fall band placed on patient.

## 2018-11-04 NOTE — ED Notes (Signed)
Patient transported to X-ray 
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
## 2018-11-04 NOTE — ED Notes (Signed)
Critical value received from lab; Hgb 6.4. MD notified.

## 2018-11-04 NOTE — Progress Notes (Addendum)
   11/04/18 2010  PCA/Epidural/Spinal Assessment  Respiratory Pattern Regular;Unlabored  Level of Consciousness  Level of Consciousness Alert  MEWS Score  MEWS RR 0  MEWS Pulse 0  MEWS Systolic 0  MEWS LOC 0  MEWS Temp 0  MEWS Score 0  MEWS Score Color Green  Provider Notification  Provider Name/Title Bodenheimer, NP  Date Provider Notified 11/04/18  Time Provider Notified 2041  Notification Type Page  Notification Reason Requested by patient/family  Response See new orders  Date of Provider Response 11/04/18  Time of Provider Response 2055   Patient on clear liquid diet.  Patient cannot eat anything on the clear liquid diet due to significant allergies.  RN paged and received order for patient to be placed on full liquid diet.  Patient was satisfied with pudding.
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
## 2018-11-04 NOTE — Progress Notes (Signed)
VAST RN consulted to place IV for blood transfusion. VAST RN assessed anterior right forearm utilizing [place]. Found good vessel for IV placement. However, current IV in R AC; changed dressing, flushed easily with good blood return.  Spoke with Percell Locus, [place] nurse regarding preservation of veins. Pt currently to receive blood transfusion and no other IV meds or fluids ordered. She checked with physician who stated he was okay with leaving second IV out for now. Educated to place IV consult if further access is needed.

## 2018-11-04 NOTE — Progress Notes (Signed)
Pt refusing to wear SCDs while asleep but states he may wear them tomorrow while awake.  Patient educated on purpose. Eliquis was placed on hold per MD note.  RN paged [facility] to notify.

## 2018-11-04 NOTE — Progress Notes (Signed)
RN spoke with blood bank about 2nd unit of blood.  Blood bank stated patient's H/H must be checked prior to receiving second unit of blood.  After first unit complete, RN placed lab order.  Will monitor hemoglobin results.

## 2018-11-05 DIAGNOSIS — E1122 Type 2 diabetes mellitus with diabetic chronic kidney disease: Secondary | ICD-10-CM | POA: Diagnosis present

## 2018-11-05 DIAGNOSIS — K219 Gastro-esophageal reflux disease without esophagitis: Secondary | ICD-10-CM | POA: Diagnosis present

## 2018-11-05 DIAGNOSIS — Z992 Dependence on renal dialysis: Secondary | ICD-10-CM | POA: Diagnosis not present

## 2018-11-05 DIAGNOSIS — I2510 Atherosclerotic heart disease of native coronary artery without angina pectoris: Secondary | ICD-10-CM

## 2018-11-05 DIAGNOSIS — I10 Essential (primary) hypertension: Secondary | ICD-10-CM

## 2018-11-05 DIAGNOSIS — Z1159 Encounter for screening for other viral diseases: Secondary | ICD-10-CM | POA: Diagnosis not present

## 2018-11-05 DIAGNOSIS — K921 Melena: Secondary | ICD-10-CM

## 2018-11-05 DIAGNOSIS — R0602 Shortness of breath: Secondary | ICD-10-CM | POA: Diagnosis present

## 2018-11-05 DIAGNOSIS — D61818 Other pancytopenia: Secondary | ICD-10-CM | POA: Diagnosis present

## 2018-11-05 DIAGNOSIS — I4821 Permanent atrial fibrillation: Secondary | ICD-10-CM | POA: Diagnosis present

## 2018-11-05 DIAGNOSIS — K31811 Angiodysplasia of stomach and duodenum with bleeding: Secondary | ICD-10-CM | POA: Diagnosis present

## 2018-11-05 DIAGNOSIS — N179 Acute kidney failure, unspecified: Secondary | ICD-10-CM | POA: Diagnosis present

## 2018-11-05 DIAGNOSIS — J449 Chronic obstructive pulmonary disease, unspecified: Secondary | ICD-10-CM | POA: Diagnosis present

## 2018-11-05 DIAGNOSIS — K922 Gastrointestinal hemorrhage, unspecified: Secondary | ICD-10-CM

## 2018-11-05 DIAGNOSIS — Z8249 Family history of ischemic heart disease and other diseases of the circulatory system: Secondary | ICD-10-CM | POA: Diagnosis not present

## 2018-11-05 DIAGNOSIS — N2581 Secondary hyperparathyroidism of renal origin: Secondary | ICD-10-CM | POA: Diagnosis present

## 2018-11-05 DIAGNOSIS — E785 Hyperlipidemia, unspecified: Secondary | ICD-10-CM | POA: Diagnosis present

## 2018-11-05 DIAGNOSIS — Z794 Long term (current) use of insulin: Secondary | ICD-10-CM | POA: Diagnosis not present

## 2018-11-05 DIAGNOSIS — D649 Anemia, unspecified: Secondary | ICD-10-CM

## 2018-11-05 DIAGNOSIS — Z7951 Long term (current) use of inhaled steroids: Secondary | ICD-10-CM | POA: Diagnosis not present

## 2018-11-05 DIAGNOSIS — Z8551 Personal history of malignant neoplasm of bladder: Secondary | ICD-10-CM | POA: Diagnosis not present

## 2018-11-05 DIAGNOSIS — I120 Hypertensive chronic kidney disease with stage 5 chronic kidney disease or end stage renal disease: Secondary | ICD-10-CM | POA: Diagnosis present

## 2018-11-05 DIAGNOSIS — E1136 Type 2 diabetes mellitus with diabetic cataract: Secondary | ICD-10-CM | POA: Diagnosis present

## 2018-11-05 DIAGNOSIS — K210 Gastro-esophageal reflux disease with esophagitis: Secondary | ICD-10-CM | POA: Diagnosis not present

## 2018-11-05 DIAGNOSIS — R042 Hemoptysis: Secondary | ICD-10-CM | POA: Diagnosis not present

## 2018-11-05 DIAGNOSIS — Z7982 Long term (current) use of aspirin: Secondary | ICD-10-CM | POA: Diagnosis not present

## 2018-11-05 DIAGNOSIS — J849 Interstitial pulmonary disease, unspecified: Secondary | ICD-10-CM | POA: Diagnosis present

## 2018-11-05 DIAGNOSIS — E1151 Type 2 diabetes mellitus with diabetic peripheral angiopathy without gangrene: Secondary | ICD-10-CM | POA: Diagnosis present

## 2018-11-05 DIAGNOSIS — M1990 Unspecified osteoarthritis, unspecified site: Secondary | ICD-10-CM | POA: Diagnosis present

## 2018-11-05 DIAGNOSIS — N186 End stage renal disease: Secondary | ICD-10-CM | POA: Diagnosis present

## 2018-11-05 DIAGNOSIS — H9190 Unspecified hearing loss, unspecified ear: Secondary | ICD-10-CM | POA: Diagnosis present

## 2018-11-05 HISTORY — DX: Gastrointestinal hemorrhage, unspecified: K92.2

## 2018-11-05 LAB — GLUCOSE, CAPILLARY
Glucose-Capillary: 92 mg/dL (ref 70–99)
Glucose-Capillary: 176 mg/dL — ABNORMAL HIGH (ref 70–99)
Glucose-Capillary: 101 mg/dL — ABNORMAL HIGH (ref 70–99)
Glucose-Capillary: 136 mg/dL — ABNORMAL HIGH (ref 70–99)

## 2018-11-05 LAB — COMPREHENSIVE METABOLIC PANEL
ALT: 29 U/L (ref 0–44)
AST: 33 U/L (ref 15–41)
Albumin: 2.1 g/dL — ABNORMAL LOW (ref 3.5–5.0)
Alkaline Phosphatase: 33 U/L — ABNORMAL LOW (ref 38–126)
Anion gap: 9 (ref 5–15)
BUN: 70 mg/dL — ABNORMAL HIGH (ref 8–23)
CO2: 27 mmol/L (ref 22–32)
Calcium: 7.8 mg/dL — ABNORMAL LOW (ref 8.9–10.3)
Chloride: 105 mmol/L (ref 98–111)
Creatinine, Ser: 4.33 mg/dL — ABNORMAL HIGH (ref 0.61–1.24)
GFR calc Af Amer: 15 mL/min — ABNORMAL LOW (ref >60)
GFR calc non Af Amer: 13 mL/min — ABNORMAL LOW (ref >60)
Glucose, Bld: 97 mg/dL (ref 70–99)
Potassium: 4.8 mmol/L (ref 3.5–5.1)
Sodium: 141 mmol/L (ref 135–145)
Total Bilirubin: 1.3 mg/dL — ABNORMAL HIGH (ref 0.3–1.2)
Total Protein: 4.8 g/dL — ABNORMAL LOW (ref 6.5–8.1)

## 2018-11-05 LAB — CBC
HCT: 27.7 % — ABNORMAL LOW (ref 39.0–52.0)
Hemoglobin: 9 g/dL — ABNORMAL LOW (ref 13.0–17.0)
MCH: 30.7 pg (ref 26.0–34.0)
MCHC: 32.5 g/dL (ref 30.0–36.0)
MCV: 94.5 fL (ref 80.0–100.0)
Platelets: 101 10*3/uL — ABNORMAL LOW (ref 150–400)
RBC: 2.93 MIL/uL — ABNORMAL LOW (ref 4.22–5.81)
RDW: 23.3 % — ABNORMAL HIGH (ref 11.5–15.5)
WBC: 4.5 10*3/uL (ref 4.0–10.5)
nRBC: 0 % (ref 0.0–0.2)

## 2018-11-05 LAB — PREPARE RBC (CROSSMATCH)

## 2018-11-05 LAB — URINALYSIS, ROUTINE W REFLEX MICROSCOPIC
Bilirubin Urine: NEGATIVE
Glucose, UA: NEGATIVE mg/dL
Ketones, ur: NEGATIVE mg/dL
Leukocytes,Ua: NEGATIVE
Nitrite: NEGATIVE
Protein, ur: 100 mg/dL — AB
RBC / HPF: >50 RBC/hpf — ABNORMAL HIGH (ref 0–5)
Specific Gravity, Urine: 1.012 (ref 1.005–1.030)
pH: 7 (ref 5.0–8.0)

## 2018-11-05 LAB — MRSA PCR SCREENING: MRSA by PCR: NEGATIVE

## 2018-11-05 MED ORDER — PENTAFLUOROPROP-TETRAFLUOROETH EX AERO: 1.0000 "application " | INHALATION_SPRAY | CUTANEOUS | Status: DC | PRN

## 2018-11-05 MED ORDER — SODIUM CHLORIDE 0.9 % IV SOLN
125.0000 mg | INTRAVENOUS | Status: DC
  Administered 2018-11-05: 125 mg via INTRAVENOUS
  Filled 2018-11-05 (×4): qty 10

## 2018-11-05 MED ORDER — ALTEPLASE 2 MG IJ SOLR: 2.0000 mg | Freq: Once | INTRAMUSCULAR | Status: DC | PRN

## 2018-11-05 MED ORDER — MIDODRINE HCL 5 MG PO TABS
10.0000 mg | ORAL_TABLET | ORAL | Status: DC
  Administered 2018-11-05: 10 mg via ORAL
  Filled 2018-11-05: qty 2

## 2018-11-05 MED ORDER — HEPARIN SODIUM (PORCINE) 1000 UNIT/ML IJ SOLN
INTRAMUSCULAR | Status: AC
  Administered 2018-11-05: 1000 [IU]
  Filled 2018-11-05: qty 4

## 2018-11-05 MED ORDER — SODIUM CHLORIDE 0.9 % IV SOLN: 100.0000 mL | INTRAVENOUS | Status: DC | PRN

## 2018-11-05 MED ORDER — DARBEPOETIN ALFA 200 MCG/0.4ML IJ SOSY: 200.0000 ug | PREFILLED_SYRINGE | INTRAMUSCULAR | Status: DC

## 2018-11-05 MED ORDER — HEPARIN SODIUM (PORCINE) 1000 UNIT/ML DIALYSIS
1000.0000 [IU] | INTRAMUSCULAR | Status: DC | PRN
  Filled 2018-11-05: qty 1

## 2018-11-05 MED ORDER — DARBEPOETIN ALFA 200 MCG/0.4ML IJ SOSY
PREFILLED_SYRINGE | INTRAMUSCULAR | Status: AC
  Administered 2018-11-05: 200 ug
  Filled 2018-11-05: qty 0.4

## 2018-11-05 MED ORDER — MIDODRINE HCL 5 MG PO TABS: 10.0000 mg | ORAL_TABLET | ORAL | Status: DC

## 2018-11-05 MED ORDER — LIDOCAINE-PRILOCAINE 2.5-2.5 % EX CREA
1.0000 "application " | TOPICAL_CREAM | CUTANEOUS | Status: DC | PRN
  Filled 2018-11-05: qty 5

## 2018-11-05 MED ORDER — LIDOCAINE HCL (PF) 1 % IJ SOLN: 5.0000 mL | INTRAMUSCULAR | Status: DC | PRN

## 2018-11-05 NOTE — Progress Notes (Signed)
Hemoglobin of 6.7.  Second unit of blood started.
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
## 2018-11-05 NOTE — Progress Notes (Addendum)
[facility]                                                                              Patient Demographics  [name], is a 73 y.o. male, DOB - 04/15/1946, XAJ:[id number]  Admit date - 11/04/2018   Admitting Physician Elwyn Reach, MD  Outpatient Primary MD for the patient is [name], MD  Outpatient specialists:   LOS - 0  days   Medical records reviewed and are as summarized below:    Chief Complaint  Patient presents with   needs blood transfusion    sent over from PCP       Brief summary   Patient is a 73 year old male with ESRD on hemodialysis TTS, bladder CA, CAD, diabetes, osteoarthritis, hypertension, PAD, diabetes recent DVT in 09/2018 on Eliquis presented with generalized weakness.  Patient was sent by his PCP due to symptomatic anemia needing blood transfusion.  Hemoglobin was 6.4. Per admitting physician. Dr Jonelle Sidle, was guaiac positive.  COVID-19 negative  Assessment & Plan    Principal Problem:   Symptomatic anemia -Hemoglobin 6.4 at the time of admission.  Poor historian, mentioned dark stools -Patient has received 2 units of packed RBC transfusion, hemoglobin 9.0. -Patient is also on Eliquis, was found to have right DVT during previous admission in 09/2018. -During previous admission, Plavix was discontinued after discussion with vascular surgery, was on Plavix for PAD.  Patient was placed on aspirin and Eliquis with cardiology and vascular surgery medications..  Due to history of interstitial lung disease and concern for contrast nephropathy, concern was that patient may have had PE in 09/2018 and hence was continued on apixaban. -Patient had endoscopy and colonoscopy in 06/2016 by Dr. Benson Norway, colonoscopy showed multiple polyps. -GI consulted, needs GI evaluation prior to resuming Eliquis.  Patient needs to be on blood thinner due to recent DVT.  Active Problems: ESRD on hemodialysis -HD schedule TTS, nephrology  consulted  Tobacco abuse -Nicotine cessation consult, continue nicotine patch    Hyperlipidemia -Continue Lipitor  GERD -Continue PPI  Chronic thrombocytopenia, pancytopenia -Follow counts closely, platelet counts improving  DVT right leg 09/2018 - Eliquis on hold   Code Status: Full CODE STATUS DVT Prophylaxis:   SCD's Family Communication: Discussed in detail with the patient, all imaging results, lab results explained to the patient   Disposition Plan: Patient presented with symptomatic anemia hemoglobin 6.4 on aspirin and Eliquis (needed for acute DVT found in 09/2018), GI bleed.  Needs GI evaluation/endoscopy before Eliquis can be safely resumed.  Will change to inpatient.  Time Spent in minutes   35 mins  Procedures:  None   Consultants:   Renal  GI   Antimicrobials:   Anti-infectives (From admission, onward)   None          Medications  Scheduled Meds:  sodium chloride   Intravenous Once   aspirin  81 mg Oral Daily   atorvastatin  80 mg Oral QHS   calcium acetate  667 mg Oral TID WC   Chlorhexidine Gluconate Cloth  6 each Topical Q0600   docusate sodium  100 mg Oral Daily  ezetimibe  10 mg Oral Daily   ferrous sulfate  325 mg Oral Daily   insulin aspart  0-5 Units Subcutaneous QHS   insulin aspart  0-9 Units Subcutaneous TID WC   insulin glargine  7 Units Subcutaneous Daily   metoprolol succinate  25 mg Oral Daily   pantoprazole  40 mg Oral Daily   predniSONE  60 mg Oral Q breakfast   Followed by   Derrill Memo ON 11/09/2018] predniSONE  50 mg Oral Q breakfast   Followed by   Derrill Memo ON 11/16/2018] predniSONE  40 mg Oral Q breakfast   Followed by   Derrill Memo ON 11/23/2018] predniSONE  30 mg Oral Q breakfast   Followed by   Derrill Memo ON 11/30/2018] predniSONE  20 mg Oral Q breakfast   Continuous Infusions:  sodium chloride     PRN Meds:.ondansetron **OR** ondansetron (ZOFRAN) IV, oxyCODONE-acetaminophen **AND**  oxyCODONE      Subjective:   [name] was seen and examined today.  No acute complaints.  No dizziness or lightheadedness, abdominal pain. Patient denies dizziness, chest pain, shortness of breath, N/V/D/C, new weakness, numbess, tingling. No acute events overnight.    Objective:   Vitals:   11/05/18 0232 11/05/18 0458 11/05/18 0743 11/05/18 1147  BP: 135/85 (!) 150/88 (!) 154/87 (!) 161/91  Pulse: 76 79 67 76  Resp: (!) 22 (!) 24 18 19   Temp: 97.6 F (36.4 C) 98.1 F (36.7 C) 97.7 F (36.5 C)   TempSrc: Oral Oral Oral   SpO2: 100% 96% 98% 96%  Weight:  85.1 kg    Height:        Intake/Output Summary (Last 24 hours) at 11/05/2018 1157 Last data filed at 11/05/2018 2035 Gross per 24 hour  Intake 675 ml  Output 150 ml  Net 525 ml     Wt Readings from Last 3 Encounters:  11/05/18 85.1 kg  11/02/18 83.9 kg  10/27/18 82.9 kg     Exam  General: Alert and oriented x 3, NAD, hearing deficit  Eyes:  HEENT:   Cardiovascular: S1 S2 auscultated, Regular rate and rhythm.  Respiratory: Clear to auscultation bilaterally, no wheezing, rales or rhonchi  Gastrointestinal: Soft, nontender, nondistended, + bowel sounds  Ext: no pedal edema bilaterally  Neuro: No new FND's  Musculoskeletal: No digital cyanosis, clubbing  Skin: No rashes  Psych: Normal affect and demeanor, alert and oriented x3    Data Reviewed:  I have personally reviewed following labs and imaging studies  Micro Results Recent Results (from the past 240 hour(s))  Culture, blood (routine x 2)     Status: None   Collection Time: 10/26/18  4:30 PM  Result Value Ref Range Status   Specimen Description BLOOD RIGHT ANTECUBITAL  Final   Special Requests   Final    BOTTLES DRAWN AEROBIC AND ANAEROBIC Blood Culture results may not be optimal due to an inadequate volume of blood received in culture bottles   Culture   Final    NO GROWTH 5 DAYS Performed at [facility], [place] [address],  [place], [place] [address]    Report Status 10/31/2018 FINAL  Final  SARS Coronavirus 2 [facility] order, Performed in [facility] lab)     Status: None   Collection Time: 10/26/18  4:30 PM  Result Value Ref Range Status   SARS Coronavirus 2 NEGATIVE NEGATIVE Final    Comment: (NOTE) If result is NEGATIVE SARS-CoV-2 target nucleic acids are NOT DETECTED. The SARS-CoV-2 RNA is  generally detectable in upper and lower  respiratory specimens during the acute phase of infection. The lowest  concentration of SARS-CoV-2 viral copies this assay can detect is 250  copies / mL. A negative result does not preclude SARS-CoV-2 infection  and should not be used as the sole basis for treatment or other  patient management decisions.  A negative result may occur with  improper specimen collection / handling, submission of specimen other  than nasopharyngeal swab, presence of viral mutation(s) within the  areas targeted by this assay, and inadequate number of viral copies  (<250 copies / mL). A negative result must be combined with clinical  observations, patient history, and epidemiological information. If result is POSITIVE SARS-CoV-2 target nucleic acids are DETECTED. The SARS-CoV-2 RNA is generally detectable in upper and lower  respiratory specimens dur ing the acute phase of infection.  Positive  results are indicative of active infection with SARS-CoV-2.  Clinical  correlation with patient history and other diagnostic information is  necessary to determine patient infection status.  Positive results do  not rule out bacterial infection or co-infection with other viruses. If result is PRESUMPTIVE POSTIVE SARS-CoV-2 nucleic acids MAY BE PRESENT.   A presumptive positive result was obtained on the submitted specimen  and confirmed on repeat testing.  While 2019 novel coronavirus  (SARS-CoV-2) nucleic acids may be present in the submitted sample  additional confirmatory testing may be  necessary for epidemiological  and / or clinical management purposes  to differentiate between  SARS-CoV-2 and other Sarbecovirus currently known to infect humans.  If clinically indicated additional testing with an alternate test  methodology [phone]) is advised. The SARS-CoV-2 RNA is generally  detectable in upper and lower respiratory sp ecimens during the acute  phase of infection. The expected result is Negative. Fact Sheet for Patients:  StrictlyIdeas.no Fact Sheet for Healthcare Providers: BankingDealers.co.za This test is not yet approved or cleared by the [place] FDA and has been authorized for detection and/or diagnosis of SARS-CoV-2 by FDA under an Emergency Use Authorization (EUA).  This EUA will remain in effect (meaning this test can be used) for the duration of the COVID-19 declaration under Section 564(b)(1) of the Act, 21 U.S.C. section 360bbb-3(b)(1), unless the authorization is terminated or revoked sooner. Performed at [facility], [place] [address] [place], [place] [address]   Culture, blood (routine x 2)     Status: None   Collection Time: 10/26/18  4:35 PM  Result Value Ref Range Status   Specimen Description BLOOD RIGHT ANTECUBITAL  Final   Special Requests   Final    BOTTLES DRAWN AEROBIC AND ANAEROBIC Blood Culture adequate volume   Culture   Final    NO GROWTH 5 DAYS Performed at [facility], [place] [address], [place], [place] [address]    Report Status 10/31/2018 FINAL  Final  Urine culture     Status: Abnormal   Collection Time: 10/26/18  9:57 PM  Result Value Ref Range Status   Specimen Description URINE, RANDOM  Final   Special Requests NONE  Final   Culture (A)  Final    <10,000 COLONIES/mL INSIGNIFICANT GROWTH Performed at [facility], [place] [address], [place], [place] [address]    Report Status 10/28/2018 FINAL  Final  MRSA PCR Screening     Status: None   Collection  Time: 11/05/18  1:41 AM  Result Value Ref Range Status   MRSA by PCR NEGATIVE NEGATIVE Final  Comment:        The GeneXpert MRSA Assay (FDA approved for NASAL specimens only), is one component of a comprehensive MRSA colonization surveillance program. It is not intended to diagnose MRSA infection nor to guide or monitor treatment for MRSA infections. Performed at [facility], [place] [address], [place], [place] [address]     Radiology Reports Dg Chest 1 View  Result Date: 10/17/2018 CLINICAL DATA:  73 year old male with a history acute kidney injury EXAM: CHEST  1 VIEW COMPARISON:  No chest x-ray for comparison. Prior CT chest 03/02/2018 FINDINGS: Cardiomediastinal silhouette within normal limits. Fullness in the central vasculature. Interlobular septal thickening. Opacity at the medial aspect of the right upper lobe, appears relatively similar to the scout image from the CT dated 03/02/2018 and the scout image from thoracic spine CT 05/05/2018, likely accentuated by slight right rotation and low lung volumes. No confluent airspace disease.  No pneumothorax or pleural effusion. Right IJ tunneled hemodialysis catheter appears to terminate superior vena cava. IMPRESSION: Reticular opacities may reflect developing fibrosis, with early changes on the prior chest CT of 03/02/2018, however, mild edema could have this appearance. Right IJ tunneled hemodialysis catheter with the tip appearing to terminate superior vena cava. Electronically Signed   By: Corrie Mckusick D.O.   On: 10/17/2018 16:36   Dg Chest 2 View  Result Date: 11/04/2018 CLINICAL DATA:  73 year old male with acute shortness of breath. EXAM: CHEST - 2 VIEW COMPARISON:  10/26/2018 and prior radiographs FINDINGS: The cardiomediastinal silhouette is unremarkable. RIGHT IJ central venous catheter again noted with tip overlying the LOWER SVC. Chronic interstitial opacities again identified. There is no evidence of focal airspace  disease, pulmonary edema, suspicious pulmonary nodule/mass, pleural effusion, or pneumothorax. No acute bony abnormalities are identified. IMPRESSION: 1. No evidence of acute cardiopulmonary disease. 2. Chronic interstitial opacities. Electronically Signed   By: Margarette Canada M.D.   On: 11/04/2018 16:48   Dg Chest 2 View  Result Date: 10/26/2018 CLINICAL DATA:  Shortness of breath, swelling in legs EXAM: CHEST - 2 VIEW COMPARISON:  10/17/2018 FINDINGS: RIGHT jugular Port-A-Cath with tip projecting over SVC. Normal heart size, mediastinal contours, and pulmonary vascularity. Atherosclerotic calcifications aorta. Chronic interstitial infiltrates again identified. Calcified granuloma lower LEFT chest. No segmental consolidation, pleural effusion or pneumothorax. Bones unremarkable. IMPRESSION: Chronic interstitial infiltrates throughout both lungs, slightly greater at bases, favor chronic interstitial lung disease. No definite new or segmental infiltrate identified. Electronically Signed   By: Lavonia Dana M.D.   On: 10/26/2018 15:08   Ir Fluoro Guide Cv Line Right  Result Date: 10/17/2018 CLINICAL DATA:  End-stage renal disease and need for tunneled hemodialysis catheter. EXAM: TUNNELED CENTRAL VENOUS HEMODIALYSIS CATHETER PLACEMENT WITH ULTRASOUND AND FLUOROSCOPIC GUIDANCE ANESTHESIA/SEDATION: 1.0 mg IV Versed; 25 mcg IV Fentanyl. Total Moderate Sedation Time:   15 minutes. The patient's level of consciousness and physiologic status were continuously monitored during the procedure by Radiology nursing. MEDICATIONS: 2 g IV Ancef. FLUOROSCOPY TIME:  54 seconds.  12.9 mGy. PROCEDURE: The procedure, risks, benefits, and alternatives were explained to the patient. Questions regarding the procedure were encouraged and answered. The patient understands and consents to the procedure. A timeout was performed prior to initiating the procedure. The right neck and chest were prepped with chlorhexidine in a sterile fashion,  and a sterile drape was applied covering the operative field. Maximum barrier sterile technique with sterile gowns and gloves were used for the procedure. Local anesthesia was provided with 1% lidocaine.  Ultrasound was used to confirm patency of the right internal jugular vein. After creating a small venotomy incision, a 21 gauge needle was advanced into the right internal jugular vein under direct, real-time ultrasound guidance. Ultrasound image documentation was performed. After securing guidewire access, an 8 Fr dilator was placed. A J-wire was kinked to measure appropriate catheter length. A Palindrome tunneled hemodialysis catheter measuring 23 cm from tip to cuff was chosen for placement. This was tunneled in a retrograde fashion from the chest wall to the venotomy incision. At the venotomy, serial dilatation was performed and a 16 Fr peel-away sheath was placed over a guidewire. The catheter was then placed through the sheath and the sheath removed. Final catheter positioning was confirmed and documented with a fluoroscopic spot image. The catheter was aspirated, flushed with saline, and injected with appropriate volume heparin dwells. The venotomy incision was closed with subcutaneous 3-0 Monocryl and subcuticular 4-0 Vicryl. Dermabond was applied to the incision. The catheter exit site was secured with 0-Prolene retention sutures. COMPLICATIONS: None.  No pneumothorax. FINDINGS: After catheter placement, the tip lies in the right atrium. The catheter aspirates normally and is ready for immediate use. IMPRESSION: Placement of tunneled hemodialysis catheter via the right internal jugular vein. The catheter tip lies in the right atrium. The catheter is ready for immediate use. Electronically Signed   By: Aletta Edouard M.D.   On: 10/17/2018 16:41   Ir Fluoro Guide Cv Line Right  Result Date: 10/07/2018 CLINICAL DATA:  Renal failure, needs venous access for hemodialysis EXAM: EXAM RIGHT IJ CATHETER  PLACEMENT UNDER ULTRASOUND AND FLUOROSCOPIC GUIDANCE TECHNIQUE: The procedure, risks (including but not limited to bleeding, infection, organ damage, pneumothorax), benefits, and alternatives were explained to the patient. Questions regarding the procedure were encouraged and answered. The patient understands and consents to the procedure. Patency of the right IJ vein was confirmed with ultrasound with image documentation. An appropriate skin site was determined. Skin site was marked. Region was prepped using maximum barrier technique including cap and mask, sterile gown, sterile gloves, large sterile sheet, and Chlorhexidine as cutaneous antisepsis. The region was infiltrated locally with 1% lidocaine. Under real-time ultrasound guidance, the right IJ vein was accessed with a 21 gauge needle; the needle tip within the vein was confirmed with ultrasound image documentation. The needle exchanged over a 018 guidewire for vascular dilator which allowed advancement of a 20 cm Mahurkar catheter. This was positioned with the tip at the cavoatrial junction. Spot chest radiograph shows good positioning and no pneumothorax. Catheter was flushed and sutured externally with 0-Prolene sutures. Patient tolerated the procedure well. FLUOROSCOPY TIME:  Less than 0.1 minute; 15 uGym2 DAP COMPLICATIONS: COMPLICATIONS none IMPRESSION: 1. Technically successful right IJ Mahurkar catheter placement. Electronically Signed   By: Lucrezia Europe M.D.   On: 10/07/2018 13:07   Ir US Guide Vasc Access Right  Result Date: 10/17/2018 CLINICAL DATA:  End-stage renal disease and need for tunneled hemodialysis catheter. EXAM: TUNNELED CENTRAL VENOUS HEMODIALYSIS CATHETER PLACEMENT WITH ULTRASOUND AND FLUOROSCOPIC GUIDANCE ANESTHESIA/SEDATION: 1.0 mg IV Versed; 25 mcg IV Fentanyl. Total Moderate Sedation Time:   15 minutes. The patient's level of consciousness and physiologic status were continuously monitored during the procedure by Radiology  nursing. MEDICATIONS: 2 g IV Ancef. FLUOROSCOPY TIME:  54 seconds.  12.9 mGy. PROCEDURE: The procedure, risks, benefits, and alternatives were explained to the patient. Questions regarding the procedure were encouraged and answered. The patient understands and consents to the procedure. A timeout was performed prior  to initiating the procedure. The right neck and chest were prepped with chlorhexidine in a sterile fashion, and a sterile drape was applied covering the operative field. Maximum barrier sterile technique with sterile gowns and gloves were used for the procedure. Local anesthesia was provided with 1% lidocaine. Ultrasound was used to confirm patency of the right internal jugular vein. After creating a small venotomy incision, a 21 gauge needle was advanced into the right internal jugular vein under direct, real-time ultrasound guidance. Ultrasound image documentation was performed. After securing guidewire access, an 8 Fr dilator was placed. A J-wire was kinked to measure appropriate catheter length. A Palindrome tunneled hemodialysis catheter measuring 23 cm from tip to cuff was chosen for placement. This was tunneled in a retrograde fashion from the chest wall to the venotomy incision. At the venotomy, serial dilatation was performed and a 16 Fr peel-away sheath was placed over a guidewire. The catheter was then placed through the sheath and the sheath removed. Final catheter positioning was confirmed and documented with a fluoroscopic spot image. The catheter was aspirated, flushed with saline, and injected with appropriate volume heparin dwells. The venotomy incision was closed with subcutaneous 3-0 Monocryl and subcuticular 4-0 Vicryl. Dermabond was applied to the incision. The catheter exit site was secured with 0-Prolene retention sutures. COMPLICATIONS: None.  No pneumothorax. FINDINGS: After catheter placement, the tip lies in the right atrium. The catheter aspirates normally and is ready for  immediate use. IMPRESSION: Placement of tunneled hemodialysis catheter via the right internal jugular vein. The catheter tip lies in the right atrium. The catheter is ready for immediate use. Electronically Signed   By: Aletta Edouard M.D.   On: 10/17/2018 16:41   Ir US Guide Vasc Access Right  Result Date: 10/07/2018 CLINICAL DATA:  Renal failure, needs venous access for hemodialysis EXAM: EXAM RIGHT IJ CATHETER PLACEMENT UNDER ULTRASOUND AND FLUOROSCOPIC GUIDANCE TECHNIQUE: The procedure, risks (including but not limited to bleeding, infection, organ damage, pneumothorax), benefits, and alternatives were explained to the patient. Questions regarding the procedure were encouraged and answered. The patient understands and consents to the procedure. Patency of the right IJ vein was confirmed with ultrasound with image documentation. An appropriate skin site was determined. Skin site was marked. Region was prepped using maximum barrier technique including cap and mask, sterile gown, sterile gloves, large sterile sheet, and Chlorhexidine as cutaneous antisepsis. The region was infiltrated locally with 1% lidocaine. Under real-time ultrasound guidance, the right IJ vein was accessed with a 21 gauge needle; the needle tip within the vein was confirmed with ultrasound image documentation. The needle exchanged over a 018 guidewire for vascular dilator which allowed advancement of a 20 cm Mahurkar catheter. This was positioned with the tip at the cavoatrial junction. Spot chest radiograph shows good positioning and no pneumothorax. Catheter was flushed and sutured externally with 0-Prolene sutures. Patient tolerated the procedure well. FLUOROSCOPY TIME:  Less than 0.1 minute; 15 uGym2 DAP COMPLICATIONS: COMPLICATIONS none IMPRESSION: 1. Technically successful right IJ Mahurkar catheter placement. Electronically Signed   By: Lucrezia Europe M.D.   On: 10/07/2018 13:07   US Biopsy (kidney)  Result Date:  10/10/2018 INDICATION: 70 year old with acute kidney injury. Request for random renal biopsy. EXAM: ULTRASOUND-GUIDED LEFT RENAL BIOPSY MEDICATIONS: None. ANESTHESIA/SEDATION: Moderate (conscious) sedation was employed during this procedure. A total of Versed 1.5 mg and Fentanyl 50 mcg was administered intravenously. Moderate Sedation Time: 17 minutes. The patient's level of consciousness and vital signs were monitored continuously by radiology nursing  throughout the procedure under my direct supervision. FLUOROSCOPY TIME:  None COMPLICATIONS: None immediate. PROCEDURE: Informed written consent was obtained from the patient after a thorough discussion of the procedural risks, benefits and alternatives. All questions were addressed. Maximal Sterile Barrier Technique was utilized including caps, mask, sterile gowns, sterile gloves, sterile drape, hand hygiene and skin antiseptic. A timeout was performed prior to the initiation of the procedure. Both kidneys were evaluated with ultrasound. The left kidney was selected for biopsy. Left flank was prepped with chlorhexidine and sterile field was created. Skin and soft tissues were anesthetized with 1% lidocaine. Using ultrasound guidance, 16 gauge core biopsy needle was directed into the left kidney lower pole. A total of 2 core biopsies were obtained. Specimens placed in saline. Bandage placed over the puncture site. FINDINGS: Negative for hydronephrosis. Core biopsies obtained from the left kidney lower pole. Negative for bleeding or hematoma formation following the core biopsies. IMPRESSION: Ultrasound-guided core biopsies of the left kidney lower pole. Electronically Signed   By: Markus Daft M.D.   On: 10/10/2018 17:15   Vas [place] Lower Extremity Venous (dvt)  Result Date: 10/28/2018  Lower Venous Study Indications: Swelling, SOB, and positive D-Dimer. Other Indications: Patient c/o shortness of breath and leg swelling. Patient                    inpatient hospital x  24 days from 09/27/18-10/20/18. Anticoagulation: Plavix. Performing Technologist: Salvadore Dom RVT, RDCS (AE), RDMS  Examination Guidelines: A complete evaluation includes B-mode imaging, spectral Doppler, color Doppler, and power Doppler as needed of all accessible portions of each vessel. Bilateral testing is considered an integral part of a complete examination. Limited examinations for reoccurring indications may be performed as noted.  +---------+---------------+---------+-----------+------------------+-------+  RIGHT     Compressibility Phasicity Spontaneity Properties         Summary  +---------+---------------+---------+-----------+------------------+-------+  CFV       Full            Yes       Yes                                     +---------+---------------+---------+-----------+------------------+-------+  SFJ       Full            Yes       Yes                                     +---------+---------------+---------+-----------+------------------+-------+  FV Prox   Partial         No        No                                      +---------+---------------+---------+-----------+------------------+-------+  FV Mid    None            No        No          brightly echogenic Chronic  +---------+---------------+---------+-----------+------------------+-------+  FV Distal None            No        No          brightly echogenic Chronic  +---------+---------------+---------+-----------+------------------+-------+  PFV  Full                                                              +---------+---------------+---------+-----------+------------------+-------+  POP       Partial         Yes       Yes         brightly echogenic Chronic  +---------+---------------+---------+-----------+------------------+-------+  PTV       Full            Yes                                               +---------+---------------+---------+-----------+------------------+-------+  PERO      Full            Yes                                                +---------+---------------+---------+-----------+------------------+-------+  Gastroc   Full                                                              +---------+---------------+---------+-----------+------------------+-------+  GSV       Full            Yes       Yes                                     +---------+---------------+---------+-----------+------------------+-------+  +---------+---------------+---------+-----------+----------+-------+  LEFT      Compressibility Phasicity Spontaneity Properties Summary  +---------+---------------+---------+-----------+----------+-------+  CFV       Full            Yes       Yes                             +---------+---------------+---------+-----------+----------+-------+  SFJ       Full            Yes       Yes                             +---------+---------------+---------+-----------+----------+-------+  FV Prox   Full            Yes       Yes                             +---------+---------------+---------+-----------+----------+-------+  FV Mid    Full            Yes       Yes                             +---------+---------------+---------+-----------+----------+-------+  FV Distal Full            Yes       Yes                             +---------+---------------+---------+-----------+----------+-------+  PFV       Full                                                      +---------+---------------+---------+-----------+----------+-------+  POP       Full            Yes       Yes                             +---------+---------------+---------+-----------+----------+-------+  PTV       Full            Yes       Yes                             +---------+---------------+---------+-----------+----------+-------+  PERO      Full            Yes       Yes                             +---------+---------------+---------+-----------+----------+-------+  Gastroc   Full                                                       +---------+---------------+---------+-----------+----------+-------+  GSV       Full            Yes       Yes                             +---------+---------------+---------+-----------+----------+-------+  Findings reported to [place] at Dr. Antony Contras office at 11:30am.  Summary:  Right: Findings consistent with chronic deep vein thrombosis involving the right femoral vein, and right popliteal vein. No cystic structure found in the popliteal fossa. All other veins visualized appear fully compressible and demonstrate appropriate Doppler characteristics.  Left: No evidence of deep vein thrombosis in the lower extremity. No indirect evidence of obstruction proximal to the inguinal ligament. No cystic structure found in the popliteal fossa.  *See table(s) above for measurements and observations. Electronically signed by [name] MD on 10/28/2018 at 1:37:31 PM.    Final     Lab Data:  CBC: Recent Labs  Lab 11/01/18 0906 11/04/18 0953 11/04/18 1600 11/04/18 2300 11/05/18 0702  WBC 3.0* 3.4* 2.3*  --  4.5  NEUTROABS 2,604 2,917 2.0  --   --   HGB 7.6* 6.8* 6.4* 6.7* 9.0*  HCT 23.5* 20.8* 20.9* 21.5* 27.7*  MCV 92.9 92.4 98.6  --  94.5  PLT 99* 107* 99*  --  656*   Basic Metabolic Panel: Recent Labs  Lab 11/02/18 0911 11/04/18 1600 11/05/18 0702  NA 138 141 141  K 4.7 4.5 4.8  CL 103 104 105  CO2 26 24 27   GLUCOSE 194* 247* 97  BUN 62* 59* 70*  CREATININE 3.85* 4.08* 4.33*  CALCIUM 7.7* 7.5* 7.8*   GFR: Estimated Creatinine Clearance: 15.5 mL/min (A) (by C-G formula based on SCr of 4.33 mg/dL (H)). Liver Function Tests: Recent Labs  Lab 11/02/18 0911 11/04/18 1600 11/05/18 0702  AST 33 37 33  ALT 30 29 29   ALKPHOS 43 42 33*  BILITOT 0.8 0.9 1.3*  PROT 4.7* 4.6* 4.8*  ALBUMIN 1.9* 2.0* 2.1*   No results for input(s): LIPASE, AMYLASE in the last 168 hours. No results for input(s): AMMONIA in the last 168 hours. Coagulation Profile: No results for input(s): INR, PROTIME  in the last 168 hours. Cardiac Enzymes: Recent Labs  Lab 11/04/18 1600  TROPONINI 0.04*   BNP (last 3 results) No results for input(s): PROBNP in the last 8760 hours. HbA1C: No results for input(s): HGBA1C in the last 72 hours. CBG: Recent Labs  Lab 11/04/18 2058 11/05/18 0645  GLUCAP 140* 92   Lipid Profile: No results for input(s): CHOL, HDL, LDLCALC, TRIG, CHOLHDL, LDLDIRECT in the last 72 hours. Thyroid Function Tests: No results for input(s): TSH, T4TOTAL, FREET4, T3FREE, THYROIDAB in the last 72 hours. Anemia Panel: No results for input(s): VITAMINB12, FOLATE, FERRITIN, TIBC, IRON, RETICCTPCT in the last 72 hours. Urine analysis:    Component Value Date/Time   COLORURINE YELLOW 11/05/2018 0601   APPEARANCEUR CLEAR 11/05/2018 0601   LABSPEC 1.012 11/05/2018 0601   PHURINE 7.0 11/05/2018 0601   GLUCOSEU NEGATIVE 11/05/2018 0601   HGBUR LARGE (A) 11/05/2018 0601   BILIRUBINUR NEGATIVE 11/05/2018 0601   KETONESUR NEGATIVE 11/05/2018 0601   PROTEINUR 100 (A) 11/05/2018 0601   NITRITE NEGATIVE 11/05/2018 0601   LEUKOCYTESUR NEGATIVE 11/05/2018 0601     [name] M.D. [facility] 11/05/2018, 11:57 AM  Pager: [phone] Between 7am to 7pm - call Pager - [phone]  After 7pm go to [email or web address] - password [email or web address]  Call night coverage person covering after 7pm

## 2018-11-06 DIAGNOSIS — N186 End stage renal disease: Secondary | ICD-10-CM

## 2018-11-06 DIAGNOSIS — Z992 Dependence on renal dialysis: Secondary | ICD-10-CM

## 2018-11-06 LAB — TYPE AND SCREEN
ABO/RH(D): A POS
Antibody Screen: NEGATIVE
Unit division: 0
Unit division: 0

## 2018-11-06 LAB — BPAM RBC
Blood Product Expiration Date: 202005142359
Blood Product Expiration Date: 202005152359
ISSUE DATE / TIME: 202005081828
ISSUE DATE / TIME: 202005090206
Unit Type and Rh: 6200
Unit Type and Rh: 6200

## 2018-11-06 LAB — URINE CULTURE: Culture: NO GROWTH

## 2018-11-06 LAB — GLUCOSE, CAPILLARY
Glucose-Capillary: 80 mg/dL (ref 70–99)
Glucose-Capillary: 361 mg/dL — ABNORMAL HIGH (ref 70–99)
Glucose-Capillary: 96 mg/dL (ref 70–99)
Glucose-Capillary: 84 mg/dL (ref 70–99)

## 2018-11-06 LAB — CBC
HCT: 27.5 % — ABNORMAL LOW (ref 39.0–52.0)
Hemoglobin: 8.9 g/dL — ABNORMAL LOW (ref 13.0–17.0)
MCH: 30.2 pg (ref 26.0–34.0)
MCHC: 32.4 g/dL (ref 30.0–36.0)
MCV: 93.2 fL (ref 80.0–100.0)
Platelets: 88 10*3/uL — ABNORMAL LOW (ref 150–400)
RBC: 2.95 MIL/uL — ABNORMAL LOW (ref 4.22–5.81)
RDW: 22.9 % — ABNORMAL HIGH (ref 11.5–15.5)
WBC: 3.9 10*3/uL — ABNORMAL LOW (ref 4.0–10.5)
nRBC: 0 % (ref 0.0–0.2)

## 2018-11-06 MED ORDER — SODIUM CHLORIDE 0.9 % IV SOLN
INTRAVENOUS | Status: DC
  Administered 2018-11-07 (×2): via INTRAVENOUS

## 2018-11-06 NOTE — Progress Notes (Signed)
[facility]                                                                              Patient Demographics  [name], is a 73 y.o. male, DOB - [birth year], XBM:[id number]  Admit date - 11/04/2018   Admitting Physician Elwyn Reach, MD  Outpatient Primary MD for the patient is [name], MD  Outpatient specialists:   LOS - 1  days   Medical records reviewed and are as summarized below:    Chief Complaint  Patient presents with   needs blood transfusion    sent over from PCP       Brief summary   Patient is a 73 year old male with ESRD on hemodialysis TTS, bladder CA, CAD, diabetes, osteoarthritis, hypertension, PAD, diabetes recent DVT in 09/2018 on Eliquis presented with generalized weakness.  Patient was sent by his PCP due to symptomatic anemia needing blood transfusion.  Hemoglobin was 6.4. Per admitting physician. Dr Jonelle Sidle, was guaiac positive.  COVID-19 negative  Assessment & Plan    Principal Problem:   Symptomatic anemia, -Hemoglobin 6.4 at the time of admission.  Poor historian, mentioned dark stools -Patient has received 2 units of packed RBC transfusion, hemoglobin 9.0. -Patient is also on Eliquis, was found to have right DVT during previous admission in 09/2018. -During previous admission, Plavix was discontinued after discussion with vascular surgery, was on Plavix for PAD.  Patient was placed on aspirin and Eliquis with cardiology and vascular surgery recommendations.  Due to history of interstitial lung disease and concern for contrast nephropathy, concern was that patient may have had PE in 09/2018 and hence was continued on apixaban. -Patient had endoscopy and colonoscopy in 06/2016 by Dr. Benson Norway, colonoscopy showed multiple polyps. -GI consulted, plan for EGD tomorrow.  Eliquis on hold.    Active Problems: ESRD on hemodialysis Hemodialysis TTS, nephrology following  Tobacco abuse -Nicotine cessation consult, continue  nicotine patch    Hyperlipidemia -Continue Lipitor  GERD -Continue PPI  Chronic thrombocytopenia, pancytopenia -Platelets trending down slowly, follow closely   DVT right leg 09/2018 -Eliquis currently on hold for endoscopy in a.m.  Code Status: Full CODE STATUS DVT Prophylaxis:   SCD's Family Communication: Discussed in detail with the patient, all imaging results, lab results explained to the patient   Disposition Plan: Patient presented with symptomatic anemia hemoglobin 6.4 on aspirin and Eliquis (needed for acute DVT found in 09/2018), GI bleed.  Needs GI evaluation/endoscopy before Eliquis can be safely resumed.  Time Spent in minutes 25-minute  Procedures:  None   Consultants:   Renal  GI   Antimicrobials:   Anti-infectives (From admission, onward)   None         Medications  Scheduled Meds:  sodium chloride   Intravenous Once   aspirin  81 mg Oral Daily   atorvastatin  80 mg Oral QHS   calcium acetate  667 mg Oral TID WC   Chlorhexidine Gluconate Cloth  6 each Topical Q0600   [START ON 11/12/2018] darbepoetin (ARANESP) injection - DIALYSIS  200 mcg Intravenous Q Sat-HD   docusate sodium  100 mg Oral Daily  ezetimibe  10 mg Oral Daily   ferrous sulfate  325 mg Oral Daily   insulin aspart  0-5 Units Subcutaneous QHS   insulin aspart  0-9 Units Subcutaneous TID WC   insulin glargine  7 Units Subcutaneous Daily   metoprolol succinate  25 mg Oral Daily   midodrine  10 mg Oral Q T,Th,Sa-HD   pantoprazole  40 mg Oral Daily   predniSONE  60 mg Oral Q breakfast   Followed by   Derrill Memo ON 11/09/2018] predniSONE  50 mg Oral Q breakfast   Followed by   Derrill Memo ON 11/16/2018] predniSONE  40 mg Oral Q breakfast   Followed by   Derrill Memo ON 11/23/2018] predniSONE  30 mg Oral Q breakfast   Followed by   Derrill Memo ON 11/30/2018] predniSONE  20 mg Oral Q breakfast   Continuous Infusions:  sodium chloride     ferric gluconate (FERRLECIT/NULECIT) IV  Stopped (11/05/18 1823)   PRN Meds:.ondansetron **OR** ondansetron (ZOFRAN) IV, oxyCODONE-acetaminophen **AND** oxyCODONE      Subjective:   [name] was seen and examined today.  No complaints, hard of hearing.  Explained the procedure for endoscopy in a.m.  Questions about anemia answered.  No lightheadedness, dizziness, chest pain today. Patient denies  shortness of breath, N/V/D/C, new weakness, numbess, tingling. No acute events overnight.    Objective:   Vitals:   11/06/18 0613 11/06/18 0751 11/06/18 1008 11/06/18 1131  BP: (!) 146/85 (!) 144/79 (!) 155/79 120/69  Pulse: 83 88 91 80  Resp: 18 18  19   Temp: 97.8 F (36.6 C) 98.6 F (37 C)  98.7 F (37.1 C)  TempSrc:  Oral  Oral  SpO2: 99% 100% 97% 99%  Weight:      Height:        Intake/Output Summary (Last 24 hours) at 11/06/2018 1430 Last data filed at 11/06/2018 1218 Gross per 24 hour  Intake 1320 ml  Output 2300 ml  Net -980 ml     Wt Readings from Last 3 Encounters:  11/06/18 84.8 kg  11/02/18 83.9 kg  10/27/18 82.9 kg   Physical Exam  General: Alert and oriented x 3, hard of hearing  Eyes:   HEENT:  Atraumatic, normocephalic  Cardiovascular: S1 S2 clear, RRR. No pedal edema b/l  Respiratory: CTAB, no wheezing, rales or rhonchi  Gastrointestinal: Soft, nontender, nondistended, NBS  Ext: no pedal edema bilaterally  Neuro: no new deficits  Musculoskeletal: No cyanosis, clubbing  Skin: No rashes  Psych: Normal affect and demeanor, alert and oriented x3    Data Reviewed:  I have personally reviewed following labs and imaging studies  Micro Results Recent Results (from the past 240 hour(s))  MRSA PCR Screening     Status: None   Collection Time: 11/05/18  1:41 AM  Result Value Ref Range Status   MRSA by PCR NEGATIVE NEGATIVE Final    Comment:        The GeneXpert MRSA Assay (FDA approved for NASAL specimens only), is one component of a comprehensive MRSA  colonization surveillance program. It is not intended to diagnose MRSA infection nor to guide or monitor treatment for MRSA infections. Performed at [facility], [place] [address], [place], [place] [address]   Urine Culture     Status: None   Collection Time: 11/05/18  6:02 AM  Result Value Ref Range Status   Specimen Description URINE, RANDOM  Final   Special Requests NONE  Final   Culture   Final  NO GROWTH Performed at [facility], [place] [address], [place], [place] [address]    Report Status 11/06/2018 FINAL  Final    Radiology Reports Dg Chest 1 View  Result Date: 10/17/2018 CLINICAL DATA:  73 year old male with a history acute kidney injury EXAM: CHEST  1 VIEW COMPARISON:  No chest x-ray for comparison. Prior CT chest 03/02/2018 FINDINGS: Cardiomediastinal silhouette within normal limits. Fullness in the central vasculature. Interlobular septal thickening. Opacity at the medial aspect of the right upper lobe, appears relatively similar to the scout image from the CT dated 03/02/2018 and the scout image from thoracic spine CT 05/05/2018, likely accentuated by slight right rotation and low lung volumes. No confluent airspace disease.  No pneumothorax or pleural effusion. Right IJ tunneled hemodialysis catheter appears to terminate superior vena cava. IMPRESSION: Reticular opacities may reflect developing fibrosis, with early changes on the prior chest CT of 03/02/2018, however, mild edema could have this appearance. Right IJ tunneled hemodialysis catheter with the tip appearing to terminate superior vena cava. Electronically Signed   By: Corrie Mckusick D.O.   On: 10/17/2018 16:36   Dg Chest 2 View  Result Date: 11/04/2018 CLINICAL DATA:  73 year old male with acute shortness of breath. EXAM: CHEST - 2 VIEW COMPARISON:  10/26/2018 and prior radiographs FINDINGS: The cardiomediastinal silhouette is unremarkable. RIGHT IJ central venous catheter again noted with tip overlying the  LOWER SVC. Chronic interstitial opacities again identified. There is no evidence of focal airspace disease, pulmonary edema, suspicious pulmonary nodule/mass, pleural effusion, or pneumothorax. No acute bony abnormalities are identified. IMPRESSION: 1. No evidence of acute cardiopulmonary disease. 2. Chronic interstitial opacities. Electronically Signed   By: Margarette Canada M.D.   On: 11/04/2018 16:48   Dg Chest 2 View  Result Date: 10/26/2018 CLINICAL DATA:  Shortness of breath, swelling in legs EXAM: CHEST - 2 VIEW COMPARISON:  10/17/2018 FINDINGS: RIGHT jugular Port-A-Cath with tip projecting over SVC. Normal heart size, mediastinal contours, and pulmonary vascularity. Atherosclerotic calcifications aorta. Chronic interstitial infiltrates again identified. Calcified granuloma lower LEFT chest. No segmental consolidation, pleural effusion or pneumothorax. Bones unremarkable. IMPRESSION: Chronic interstitial infiltrates throughout both lungs, slightly greater at bases, favor chronic interstitial lung disease. No definite new or segmental infiltrate identified. Electronically Signed   By: Lavonia Dana M.D.   On: 10/26/2018 15:08   Ir Fluoro Guide Cv Line Right  Result Date: 10/17/2018 CLINICAL DATA:  End-stage renal disease and need for tunneled hemodialysis catheter. EXAM: TUNNELED CENTRAL VENOUS HEMODIALYSIS CATHETER PLACEMENT WITH ULTRASOUND AND FLUOROSCOPIC GUIDANCE ANESTHESIA/SEDATION: 1.0 mg IV Versed; 25 mcg IV Fentanyl. Total Moderate Sedation Time:   15 minutes. The patient's level of consciousness and physiologic status were continuously monitored during the procedure by Radiology nursing. MEDICATIONS: 2 g IV Ancef. FLUOROSCOPY TIME:  54 seconds.  12.9 mGy. PROCEDURE: The procedure, risks, benefits, and alternatives were explained to the patient. Questions regarding the procedure were encouraged and answered. The patient understands and consents to the procedure. A timeout was performed prior to  initiating the procedure. The right neck and chest were prepped with chlorhexidine in a sterile fashion, and a sterile drape was applied covering the operative field. Maximum barrier sterile technique with sterile gowns and gloves were used for the procedure. Local anesthesia was provided with 1% lidocaine. Ultrasound was used to confirm patency of the right internal jugular vein. After creating a small venotomy incision, a 21 gauge needle was advanced into the right internal jugular vein under direct, real-time ultrasound guidance.  Ultrasound image documentation was performed. After securing guidewire access, an 8 Fr dilator was placed. A J-wire was kinked to measure appropriate catheter length. A Palindrome tunneled hemodialysis catheter measuring 23 cm from tip to cuff was chosen for placement. This was tunneled in a retrograde fashion from the chest wall to the venotomy incision. At the venotomy, serial dilatation was performed and a 16 Fr peel-away sheath was placed over a guidewire. The catheter was then placed through the sheath and the sheath removed. Final catheter positioning was confirmed and documented with a fluoroscopic spot image. The catheter was aspirated, flushed with saline, and injected with appropriate volume heparin dwells. The venotomy incision was closed with subcutaneous 3-0 Monocryl and subcuticular 4-0 Vicryl. Dermabond was applied to the incision. The catheter exit site was secured with 0-Prolene retention sutures. COMPLICATIONS: None.  No pneumothorax. FINDINGS: After catheter placement, the tip lies in the right atrium. The catheter aspirates normally and is ready for immediate use. IMPRESSION: Placement of tunneled hemodialysis catheter via the right internal jugular vein. The catheter tip lies in the right atrium. The catheter is ready for immediate use. Electronically Signed   By: Aletta Edouard M.D.   On: 10/17/2018 16:41   Ir US Guide Vasc Access Right  Result Date:  10/17/2018 CLINICAL DATA:  End-stage renal disease and need for tunneled hemodialysis catheter. EXAM: TUNNELED CENTRAL VENOUS HEMODIALYSIS CATHETER PLACEMENT WITH ULTRASOUND AND FLUOROSCOPIC GUIDANCE ANESTHESIA/SEDATION: 1.0 mg IV Versed; 25 mcg IV Fentanyl. Total Moderate Sedation Time:   15 minutes. The patient's level of consciousness and physiologic status were continuously monitored during the procedure by Radiology nursing. MEDICATIONS: 2 g IV Ancef. FLUOROSCOPY TIME:  54 seconds.  12.9 mGy. PROCEDURE: The procedure, risks, benefits, and alternatives were explained to the patient. Questions regarding the procedure were encouraged and answered. The patient understands and consents to the procedure. A timeout was performed prior to initiating the procedure. The right neck and chest were prepped with chlorhexidine in a sterile fashion, and a sterile drape was applied covering the operative field. Maximum barrier sterile technique with sterile gowns and gloves were used for the procedure. Local anesthesia was provided with 1% lidocaine. Ultrasound was used to confirm patency of the right internal jugular vein. After creating a small venotomy incision, a 21 gauge needle was advanced into the right internal jugular vein under direct, real-time ultrasound guidance. Ultrasound image documentation was performed. After securing guidewire access, an 8 Fr dilator was placed. A J-wire was kinked to measure appropriate catheter length. A Palindrome tunneled hemodialysis catheter measuring 23 cm from tip to cuff was chosen for placement. This was tunneled in a retrograde fashion from the chest wall to the venotomy incision. At the venotomy, serial dilatation was performed and a 16 Fr peel-away sheath was placed over a guidewire. The catheter was then placed through the sheath and the sheath removed. Final catheter positioning was confirmed and documented with a fluoroscopic spot image. The catheter was aspirated, flushed  with saline, and injected with appropriate volume heparin dwells. The venotomy incision was closed with subcutaneous 3-0 Monocryl and subcuticular 4-0 Vicryl. Dermabond was applied to the incision. The catheter exit site was secured with 0-Prolene retention sutures. COMPLICATIONS: None.  No pneumothorax. FINDINGS: After catheter placement, the tip lies in the right atrium. The catheter aspirates normally and is ready for immediate use. IMPRESSION: Placement of tunneled hemodialysis catheter via the right internal jugular vein. The catheter tip lies in the right atrium. The catheter is ready for immediate  use. Electronically Signed   By: Aletta Edouard M.D.   On: 10/17/2018 16:41   US Biopsy (kidney)  Result Date: 10/10/2018 INDICATION: 61 year old with acute kidney injury. Request for random renal biopsy. EXAM: ULTRASOUND-GUIDED LEFT RENAL BIOPSY MEDICATIONS: None. ANESTHESIA/SEDATION: Moderate (conscious) sedation was employed during this procedure. A total of Versed 1.5 mg and Fentanyl 50 mcg was administered intravenously. Moderate Sedation Time: 17 minutes. The patient's level of consciousness and vital signs were monitored continuously by radiology nursing throughout the procedure under my direct supervision. FLUOROSCOPY TIME:  None COMPLICATIONS: None immediate. PROCEDURE: Informed written consent was obtained from the patient after a thorough discussion of the procedural risks, benefits and alternatives. All questions were addressed. Maximal Sterile Barrier Technique was utilized including caps, mask, sterile gowns, sterile gloves, sterile drape, hand hygiene and skin antiseptic. A timeout was performed prior to the initiation of the procedure. Both kidneys were evaluated with ultrasound. The left kidney was selected for biopsy. Left flank was prepped with chlorhexidine and sterile field was created. Skin and soft tissues were anesthetized with 1% lidocaine. Using ultrasound guidance, 16 gauge core  biopsy needle was directed into the left kidney lower pole. A total of 2 core biopsies were obtained. Specimens placed in saline. Bandage placed over the puncture site. FINDINGS: Negative for hydronephrosis. Core biopsies obtained from the left kidney lower pole. Negative for bleeding or hematoma formation following the core biopsies. IMPRESSION: Ultrasound-guided core biopsies of the left kidney lower pole. Electronically Signed   By: Markus Daft M.D.   On: 10/10/2018 17:15   Vas [place] Lower Extremity Venous (dvt)  Result Date: 10/28/2018  Lower Venous Study Indications: Swelling, SOB, and positive D-Dimer. Other Indications: Patient c/o shortness of breath and leg swelling. Patient                    inpatient hospital x 24 days from 09/27/18-10/20/18. Anticoagulation: Plavix. Performing Technologist: Salvadore Dom RVT, RDCS (AE), RDMS  Examination Guidelines: A complete evaluation includes B-mode imaging, spectral Doppler, color Doppler, and power Doppler as needed of all accessible portions of each vessel. Bilateral testing is considered an integral part of a complete examination. Limited examinations for reoccurring indications may be performed as noted.  +---------+---------------+---------+-----------+------------------+-------+  RIGHT     Compressibility Phasicity Spontaneity Properties         Summary  +---------+---------------+---------+-----------+------------------+-------+  CFV       Full            Yes       Yes                                     +---------+---------------+---------+-----------+------------------+-------+  SFJ       Full            Yes       Yes                                     +---------+---------------+---------+-----------+------------------+-------+  FV Prox   Partial         No        No                                      +---------+---------------+---------+-----------+------------------+-------+  FV Mid  None            No        No          brightly echogenic Chronic   +---------+---------------+---------+-----------+------------------+-------+  FV Distal None            No        No          brightly echogenic Chronic  +---------+---------------+---------+-----------+------------------+-------+  PFV       Full                                                              +---------+---------------+---------+-----------+------------------+-------+  POP       Partial         Yes       Yes         brightly echogenic Chronic  +---------+---------------+---------+-----------+------------------+-------+  PTV       Full            Yes                                               +---------+---------------+---------+-----------+------------------+-------+  PERO      Full            Yes                                               +---------+---------------+---------+-----------+------------------+-------+  Gastroc   Full                                                              +---------+---------------+---------+-----------+------------------+-------+  GSV       Full            Yes       Yes                                     +---------+---------------+---------+-----------+------------------+-------+  +---------+---------------+---------+-----------+----------+-------+  LEFT      Compressibility Phasicity Spontaneity Properties Summary  +---------+---------------+---------+-----------+----------+-------+  CFV       Full            Yes       Yes                             +---------+---------------+---------+-----------+----------+-------+  SFJ       Full            Yes       Yes                             +---------+---------------+---------+-----------+----------+-------+  FV Prox   Full            Yes  Yes                             +---------+---------------+---------+-----------+----------+-------+  FV Mid    Full            Yes       Yes                             +---------+---------------+---------+-----------+----------+-------+  FV Distal Full            Yes        Yes                             +---------+---------------+---------+-----------+----------+-------+  PFV       Full                                                      +---------+---------------+---------+-----------+----------+-------+  POP       Full            Yes       Yes                             +---------+---------------+---------+-----------+----------+-------+  PTV       Full            Yes       Yes                             +---------+---------------+---------+-----------+----------+-------+  PERO      Full            Yes       Yes                             +---------+---------------+---------+-----------+----------+-------+  Gastroc   Full                                                      +---------+---------------+---------+-----------+----------+-------+  GSV       Full            Yes       Yes                             +---------+---------------+---------+-----------+----------+-------+  Findings reported to [place] at Dr. Antony Contras office at 11:30am.  Summary:  Right: Findings consistent with chronic deep vein thrombosis involving the right femoral vein, and right popliteal vein. No cystic structure found in the popliteal fossa. All other veins visualized appear fully compressible and demonstrate appropriate Doppler characteristics.  Left: No evidence of deep vein thrombosis in the lower extremity. No indirect evidence of obstruction proximal to the inguinal ligament. No cystic structure found in the popliteal fossa.  *See table(s) above for measurements and observations. Electronically signed by [name] MD on 10/28/2018 at 1:37:31 PM.    Final     Lab Data:  CBC: Recent  Labs  Lab 11/01/18 0906 11/04/18 0953 11/04/18 1600 11/04/18 2300 11/05/18 0702 11/06/18 0655  WBC 3.0* 3.4* 2.3*  --  4.5 3.9*  NEUTROABS 2,604 2,917 2.0  --   --   --   HGB 7.6* 6.8* 6.4* 6.7* 9.0* 8.9*  HCT 23.5* 20.8* 20.9* 21.5* 27.7* 27.5*  MCV 92.9 92.4 98.6  --  94.5 93.2  PLT 99* 107* 99*   --  101* 88*   Basic Metabolic Panel: Recent Labs  Lab 11/02/18 0911 11/04/18 1600 11/05/18 0702  NA 138 141 141  K 4.7 4.5 4.8  CL 103 104 105  CO2 26 24 27   GLUCOSE 194* 247* 97  BUN 62* 59* 70*  CREATININE 3.85* 4.08* 4.33*  CALCIUM 7.7* 7.5* 7.8*   GFR: Estimated Creatinine Clearance: 15.5 mL/min (A) (by C-G formula based on SCr of 4.33 mg/dL (H)). Liver Function Tests: Recent Labs  Lab 11/02/18 0911 11/04/18 1600 11/05/18 0702  AST 33 37 33  ALT 30 29 29   ALKPHOS 43 42 33*  BILITOT 0.8 0.9 1.3*  PROT 4.7* 4.6* 4.8*  ALBUMIN 1.9* 2.0* 2.1*   No results for input(s): LIPASE, AMYLASE in the last 168 hours. No results for input(s): AMMONIA in the last 168 hours. Coagulation Profile: No results for input(s): INR, PROTIME in the last 168 hours. Cardiac Enzymes: Recent Labs  Lab 11/04/18 1600  TROPONINI 0.04*   BNP (last 3 results) No results for input(s): PROBNP in the last 8760 hours. HbA1C: No results for input(s): HGBA1C in the last 72 hours. CBG: Recent Labs  Lab 11/05/18 1144 11/05/18 1820 11/05/18 2114 11/06/18 0614 11/06/18 1132  GLUCAP 176* 101* 136* 80 361*   Lipid Profile: No results for input(s): CHOL, HDL, LDLCALC, TRIG, CHOLHDL, LDLDIRECT in the last 72 hours. Thyroid Function Tests: No results for input(s): TSH, T4TOTAL, FREET4, T3FREE, THYROIDAB in the last 72 hours. Anemia Panel: No results for input(s): VITAMINB12, FOLATE, FERRITIN, TIBC, IRON, RETICCTPCT in the last 72 hours. Urine analysis:    Component Value Date/Time   COLORURINE YELLOW 11/05/2018 0601   APPEARANCEUR CLEAR 11/05/2018 0601   LABSPEC 1.012 11/05/2018 0601   PHURINE 7.0 11/05/2018 0601   GLUCOSEU NEGATIVE 11/05/2018 0601   HGBUR LARGE (A) 11/05/2018 0601   BILIRUBINUR NEGATIVE 11/05/2018 0601   KETONESUR NEGATIVE 11/05/2018 0601   PROTEINUR 100 (A) 11/05/2018 0601   NITRITE NEGATIVE 11/05/2018 0601   LEUKOCYTESUR NEGATIVE 11/05/2018 0601     Ladale Sherburn  M.D. [facility] 11/06/2018, 2:30 PM  Pager: [phone] Between 7am to 7pm - call Pager - [phone]  After 7pm go to [email or web address] - password [email or web address]  Call night coverage person covering after 7pm

## 2018-11-06 NOTE — Plan of Care (Signed)
  Problem: Education: Goal: Knowledge of General Education information will improve Description Including pain rating scale, medication(s)/side effects and non-pharmacologic comfort measures Outcome: Progressing   Problem: Health Behavior/Discharge Planning: Goal: Ability to manage health-related needs will improve Outcome: Progressing   

## 2018-11-06 NOTE — Progress Notes (Signed)
[facility] Progress Note    Assessment/ Plan:   73 y.o. male with AKI on hemodialysis T,Th,S at [facility]. 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 Admitted for HGB 6.8.  ASA stopped in setting of low HGB on Apixaban.  He has received 2 units PRBCs, HGB now up to 9.0.  Blind in R eye.   Dialysis Orders: [facility] T,Th,S 3.5 hrs 180NRe 350/500  82 kg 2.0 K/2.5 Ca  -No Heparin -Mircera 200 mcg IV q 2 weeks (dose not started yet) -Venofer 100 mg IV X 5 doses (3/5 doses)  Assessment/Plan: 1. Symptomatic Anemia in setting of heme + stools/NOAC. HGB 6.7 on adm Has received 2 units PRBCs. HGB now 9.0. GI consult per primary prior to resuming Apixaban. He is going for a EGD with possible colonoscopy tomorrow (Mon).  If nothing significant is found on GI studies may consider bronchoscopy; some hemoptysis since diagnosis and we've been attributing the DVT + PE on anticoagulation. But pauciimmunie GN can also cause hemoptysis and this is the cause then TPE is indicated.  2. H/O DVT on Apixiban-Had been on ASA/Apixiban per cardiology recs however stopped ASA 11/04/18 after HGB 6.8 reported. Apixaban on hold. Per primary.  3.  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 SCr 4.47-5.18. S/P Rituxan 1000 mg 11/02/18. Oral prednisone taper. Has HD at [facility] T,Th,S. HD today on schedule. K+ 4.8. No heparin. Has pork allergy on med list but apparently gets [facility] flushed with heparin without incident.   - Last HD on 5/9 with 3.5hr treatment and 1.9L net UF. - Next HD Tuesday if he's still here  4.  Hypertension/volume  - Issue with volume removal at OP center D/T hypotension. Will start midodrine 10 mg PO TIW, lower machine temperature to 36 degrees. Add UFP 2. SBP currently in 150s.  5.   Anemia  -As noted above. Give Aranesp 200 mcg IV with HD today. Continue venofer load. DC oral iron.  6.  Metabolic bone disease - Ca 7.8 C Ca 9.3  No binders yet. Continue VDRA.  7.  Nutrition -Albumin 2.1 Add prostat, renal vits.  8.  DM-per primary   Subjective:   Occasional hemoptysis.   Objective:   BP (!) 155/79   Pulse 91   Temp 98.6 F (37 C) (Oral)   Resp 18   Ht 5\' 10"  (1.778 m)   Wt 84.8 kg   SpO2 97%   BMI 26.82 kg/m   Intake/Output Summary (Last 24 hours) at 11/06/2018 1021 Last data filed at 11/06/2018 0900 Gross per 24 hour  Intake 1080 ml  Output 2300 ml  Net -1220 ml   Weight change: 1.285 kg  Physical Exam: GEN: NAD, A&Ox3, NCAT HEENT: No conjunctival pallor, EOMI NECK: Supple, no thyromegaly LUNGS: CTA B/L no rales, rhonchi or wheezing CV: RRR, No M/R/G ABD: SNDNT +BS  EXT: No lower extremity edema ACCESS: RIJ TC    Imaging: Dg Chest 2 View  Result Date: 11/04/2018 CLINICAL DATA:  73 year old male with acute shortness of breath. EXAM: CHEST - 2 VIEW COMPARISON:  10/26/2018 and prior radiographs FINDINGS: The cardiomediastinal silhouette is unremarkable. RIGHT IJ central venous catheter again noted with tip overlying the LOWER SVC. Chronic interstitial opacities again identified. There is no evidence of focal airspace disease,  pulmonary edema, suspicious pulmonary nodule/mass, pleural effusion, or pneumothorax. No acute bony abnormalities are identified. IMPRESSION: 1. No evidence of acute cardiopulmonary disease. 2. Chronic interstitial opacities. Electronically Signed   By: Margarette Canada M.D.   On: 11/04/2018 16:48    Labs: BMET Recent Labs  Lab 11/02/18 0911 11/04/18 1600 11/05/18 0702  NA 138 141 141  K 4.7 4.5 4.8  CL 103 104 105  CO2 26 24 27   GLUCOSE 194* 247* 97  BUN 62* 59* 70*  CREATININE 3.85* 4.08* 4.33*  CALCIUM 7.7* 7.5* 7.8*   CBC Recent Labs  Lab 11/01/18 0906 11/04/18 0953 11/04/18 1600 11/04/18 2300 11/05/18 0702  11/06/18 0655  WBC 3.0* 3.4* 2.3*  --  4.5 3.9*  NEUTROABS 2,604 2,917 2.0  --   --   --   HGB 7.6* 6.8* 6.4* 6.7* 9.0* 8.9*  HCT 23.5* 20.8* 20.9* 21.5* 27.7* 27.5*  MCV 92.9 92.4 98.6  --  94.5 93.2  PLT 99* 107* 99*  --  101* 88*    Medications:    . sodium chloride   Intravenous Once  . aspirin  81 mg Oral Daily  . atorvastatin  80 mg Oral QHS  . calcium acetate  667 mg Oral TID WC  . Chlorhexidine Gluconate Cloth  6 each Topical Q0600  . [START ON 11/12/2018] darbepoetin (ARANESP) injection - DIALYSIS  200 mcg Intravenous Q Sat-HD  . docusate sodium  100 mg Oral Daily  . ezetimibe  10 mg Oral Daily  . ferrous sulfate  325 mg Oral Daily  . insulin aspart  0-5 Units Subcutaneous QHS  . insulin aspart  0-9 Units Subcutaneous TID WC  . insulin glargine  7 Units Subcutaneous Daily  . metoprolol succinate  25 mg Oral Daily  . midodrine  10 mg Oral Q T,Th,Sa-HD  . pantoprazole  40 mg Oral Daily  . predniSONE  60 mg Oral Q breakfast   Followed by  . [START ON 11/09/2018] predniSONE  50 mg Oral Q breakfast   Followed by  . [START ON 11/16/2018] predniSONE  40 mg Oral Q breakfast   Followed by  . [START ON 11/23/2018] predniSONE  30 mg Oral Q breakfast   Followed by  . [START ON 11/30/2018] predniSONE  20 mg Oral Q breakfast      Otelia Santee, MD 11/06/2018, 10:21 AM

## 2018-11-06 NOTE — Progress Notes (Addendum)
     [facility] Progress Note  CC:  Anemia   Subjective: He reports feeling well today. Tolerating a full liquid diet. No abdominal pain. Passed a grayish stool earlier today. No melena or rectal bleeding.  Objective:  Vital signs in last 24 hours: Temp:  [97.2 F (36.2 C)-98.7 F (37.1 C)] 98.7 F (37.1 C) (05/10 1131) Pulse Rate:  [69-93] 80 (05/10 1131) Resp:  [18-22] 19 (05/10 1131) BP: (106-156)/(55-95) 120/69 (05/10 1131) SpO2:  [97 %-100 %] 99 % (05/10 1131) Weight:  [83.3 kg-84.8 kg] 84.8 kg (05/10 0511) Last BM Date: 11/05/18 General:   Alert,  Well-developed,  in NAD Eyes: left eyeball absent Heart: distant S1, S2, no murmur. Pulm: clear throughout. Abdomen:  Soft, nontender and nondistended. Normal bowel sounds, without guarding, and without rebound.  Midline scar intact. Palpable graft left abdomen. Extremities:  LEs 1+ edema. Neurologic:  Alert and  oriented x4;  grossly normal neurologically. Psych:  Alert and cooperative. Normal mood and affect.  Intake/Output from previous day: 05/09 0701 - 05/10 0700 In: 360 [P.O.:360] Out: 2300 [Urine:400] Intake/Output this shift: Total I/O In: 1320 [P.O.:1320] Out: -   Lab Results: Recent Labs    11/04/18 1600 11/04/18 2300 11/05/18 0702 11/06/18 0655  WBC 2.3*  --  4.5 3.9*  HGB 6.4* 6.7* 9.0* 8.9*  HCT 20.9* 21.5* 27.7* 27.5*  PLT 99*  --  101* 88*   BMET Recent Labs    11/04/18 1600 11/05/18 0702  NA 141 141  K 4.5 4.8  CL 104 105  CO2 24 27  GLUCOSE 247* 97  BUN 59* 70*  CREATININE 4.08* 4.33*  CALCIUM 7.5* 7.8*   LFT Recent Labs    11/05/18 0702  PROT 4.8*  ALBUMIN 2.1*  AST 33  ALT 29  ALKPHOS 33*  BILITOT 1.3*    Dg Chest 2 View  Result Date: 11/04/2018 CLINICAL DATA:  73 year old male with acute shortness of breath. EXAM: CHEST - 2 VIEW COMPARISON:  10/26/2018 and prior radiographs FINDINGS: The cardiomediastinal silhouette is unremarkable. RIGHT IJ central venous  catheter again noted with tip overlying the LOWER SVC. Chronic interstitial opacities again identified. There is no evidence of focal airspace disease, pulmonary edema, suspicious pulmonary nodule/mass, pleural effusion, or pneumothorax. No acute bony abnormalities are identified. IMPRESSION: 1. No evidence of acute cardiopulmonary disease. 2. Chronic interstitial opacities. Electronically Signed   By: Margarette Canada M.D.   On: 11/04/2018 16:48    Assessment / Plan:  1. 73 y.o. male with ESRD on HD with acute on chronic anemia with GI bleed, coffee ground stool on Eliquis d/t DVT and presumed PE (unable to do CTPA d/t ESRD) 09/2018. Eliquis currently on hold. Hg 8 - 9. Hg 6.4 in ED. Transfused 2 units PRBCs >> Hg 9.0. Today Hg 8.9. -hold Eliquis for now, continue ASA 81mg  for now, pt is high risk from thrombus formation, DVT/PE 4/22020, aortic graft, he may require heparin if he remains off Eliquis -repeat H/H in am -transfuse for Hg < 8 -monitor active GI bleeding -plan for EGD on Monday 11/07/2018. -agree with Pantoprazole 40mg  po QD -full liquid diet, NPO after midnight.  2. ESRD on HD 3. Hx Bladder cancer. 4. COPD. 5. CAD. 6. DM II 7. Hx colon polyps, past due for surveillance colonoscopy     LOS: 1 day   [name]  11/06/2018, 2:29 PM

## 2018-11-07 ENCOUNTER — Inpatient Hospital Stay (HOSPITAL_COMMUNITY): Payer: Medicare HMO | Admitting: Certified Registered Nurse Anesthetist

## 2018-11-07 ENCOUNTER — Encounter (HOSPITAL_COMMUNITY)
Admission: EM | Disposition: A | Discharge status: Home / Self Care | Payer: Self-pay | Source: Home / Self Care | Attending: Internal Medicine

## 2018-11-07 ENCOUNTER — Encounter (HOSPITAL_COMMUNITY): Payer: Self-pay | Admitting: Gastroenterology

## 2018-11-07 DIAGNOSIS — K210 Gastro-esophageal reflux disease with esophagitis: Secondary | ICD-10-CM

## 2018-11-07 HISTORY — PX: BIOPSY: SHX5522

## 2018-11-07 HISTORY — PX: HOT HEMOSTASIS: SHX5433

## 2018-11-07 HISTORY — PX: ESOPHAGOGASTRODUODENOSCOPY (EGD) WITH PROPOFOL: SHX5813

## 2018-11-07 LAB — CBC WITH DIFFERENTIAL/PLATELET
Absolute Monocytes: 160 cells/uL — ABNORMAL LOW (ref 200–950)
Basophils Absolute: 0 cells/uL (ref 0–200)
Basophils Relative: 0 %
Eosinophils Absolute: 0 cells/uL — ABNORMAL LOW (ref 15–500)
Eosinophils Relative: 0 %
HCT: 20.8 % — ABNORMAL LOW (ref 38.5–50.0)
Hemoglobin: 6.8 g/dL — ABNORMAL LOW (ref 13.2–17.1)
Lymphs Abs: 323 cells/uL — ABNORMAL LOW (ref 850–3900)
MCH: 30.2 pg (ref 27.0–33.0)
MCHC: 32.7 g/dL (ref 32.0–36.0)
MCV: 92.4 fL (ref 80.0–100.0)
MPV: 11 fL (ref 7.5–12.5)
Monocytes Relative: 4.7 %
Neutro Abs: 2917 cells/uL (ref 1500–7800)
Neutrophils Relative %: 85.8 %
Platelets: 107 10*3/uL — ABNORMAL LOW (ref 140–400)
RBC: 2.25 10*6/uL — ABNORMAL LOW (ref 4.20–5.80)
RDW: 22 % — ABNORMAL HIGH (ref 11.0–15.0)
Total Lymphocyte: 9.5 %
WBC: 3.4 10*3/uL — ABNORMAL LOW (ref 3.8–10.8)

## 2018-11-07 LAB — GLUCOSE, CAPILLARY
Glucose-Capillary: 63 mg/dL — ABNORMAL LOW (ref 70–99)
Glucose-Capillary: 109 mg/dL — ABNORMAL HIGH (ref 70–99)
Glucose-Capillary: 70 mg/dL (ref 70–99)
Glucose-Capillary: 112 mg/dL — ABNORMAL HIGH (ref 70–99)
Glucose-Capillary: 61 mg/dL — ABNORMAL LOW (ref 70–99)
Glucose-Capillary: 93 mg/dL (ref 70–99)
Glucose-Capillary: 178 mg/dL — ABNORMAL HIGH (ref 70–99)

## 2018-11-07 LAB — CBC
HCT: 27.1 % — ABNORMAL LOW (ref 39.0–52.0)
Hemoglobin: 9.1 g/dL — ABNORMAL LOW (ref 13.0–17.0)
MCH: 31.1 pg (ref 26.0–34.0)
MCHC: 33.6 g/dL (ref 30.0–36.0)
MCV: 92.5 fL (ref 80.0–100.0)
Platelets: 88 10*3/uL — ABNORMAL LOW (ref 150–400)
RBC: 2.93 MIL/uL — ABNORMAL LOW (ref 4.22–5.81)
RDW: 22.3 % — ABNORMAL HIGH (ref 11.5–15.5)
WBC: 4.7 10*3/uL (ref 4.0–10.5)
nRBC: 0 % (ref 0.0–0.2)

## 2018-11-07 LAB — BASIC METABOLIC PANEL
Anion gap: 11 (ref 5–15)
BUN: 54 mg/dL — ABNORMAL HIGH (ref 8–23)
CO2: 27 mmol/L (ref 22–32)
Calcium: 7.5 mg/dL — ABNORMAL LOW (ref 8.9–10.3)
Chloride: 97 mmol/L — ABNORMAL LOW (ref 98–111)
Creatinine, Ser: 3.79 mg/dL — ABNORMAL HIGH (ref 0.61–1.24)
GFR calc Af Amer: 17 mL/min — ABNORMAL LOW (ref >60)
GFR calc non Af Amer: 15 mL/min — ABNORMAL LOW (ref >60)
Glucose, Bld: 70 mg/dL (ref 70–99)
Potassium: 3.9 mmol/L (ref 3.5–5.1)
Sodium: 135 mmol/L (ref 135–145)

## 2018-11-07 LAB — SARS CORONAVIRUS 2 BY RT PCR (HOSPITAL ORDER, PERFORMED IN ~~LOC~~ HOSPITAL LAB): SARS Coronavirus 2: NEGATIVE

## 2018-11-07 SURGERY — ESOPHAGOGASTRODUODENOSCOPY (EGD) WITH PROPOFOL
Anesthesia: Monitor Anesthesia Care

## 2018-11-07 MED ORDER — DEXTROSE 50 % IV SOLN
25.0000 mL | Freq: Once | INTRAVENOUS | Status: AC
  Administered 2018-11-07: 06:00:00 25 mL via INTRAVENOUS

## 2018-11-07 MED ORDER — CHLORHEXIDINE GLUCONATE CLOTH 2 % EX PADS: 6.0000 | MEDICATED_PAD | Freq: Every day | CUTANEOUS | Status: DC

## 2018-11-07 MED ORDER — DEXTROSE 50 % IV SOLN
12.5000 g | INTRAVENOUS | Status: AC
  Administered 2018-11-07: 12.5 g via INTRAVENOUS

## 2018-11-07 MED ORDER — DEXTROSE 50 % IV SOLN
INTRAVENOUS | Status: AC
  Filled 2018-11-07: qty 50

## 2018-11-07 MED ORDER — PROPOFOL 500 MG/50ML IV EMUL
INTRAVENOUS | Status: DC | PRN
  Administered 2018-11-07: 75 ug/kg/min via INTRAVENOUS

## 2018-11-07 MED ORDER — DEXTROSE 50 % IV SOLN
INTRAVENOUS | Status: AC
  Administered 2018-11-07: 25 mL via INTRAVENOUS
  Filled 2018-11-07: qty 50

## 2018-11-07 MED ORDER — LIDOCAINE HCL (CARDIAC) PF 100 MG/5ML IV SOSY
PREFILLED_SYRINGE | INTRAVENOUS | Status: DC | PRN
  Administered 2018-11-07: 60 mg via INTRATRACHEAL

## 2018-11-07 MED ORDER — ONDANSETRON HCL 4 MG/2ML IJ SOLN
INTRAMUSCULAR | Status: DC | PRN
  Administered 2018-11-07: 4 mg via INTRAVENOUS

## 2018-11-07 MED ORDER — PHENYLEPHRINE HCL (PRESSORS) 10 MG/ML IV SOLN
INTRAVENOUS | Status: DC | PRN
  Administered 2018-11-07 (×3): 80 ug via INTRAVENOUS

## 2018-11-07 MED ORDER — PROPOFOL 10 MG/ML IV BOLUS
INTRAVENOUS | Status: DC | PRN
  Administered 2018-11-07: 30 mg via INTRAVENOUS

## 2018-11-07 MED ORDER — IPRATROPIUM-ALBUTEROL 0.5-2.5 (3) MG/3ML IN SOLN
3.0000 mL | Freq: Four times a day (QID) | RESPIRATORY_TRACT | Status: DC
  Administered 2018-11-08 – 2018-11-09 (×2): 3 mL via RESPIRATORY_TRACT
  Filled 2018-11-07 (×3): qty 3

## 2018-11-07 SURGICAL SUPPLY — 14 items

## 2018-11-07 NOTE — Consult Note (Signed)
   [facility] CM Inpatient Consult   11/07/2018  [name]. [birth year] [id number]   Patient screened for extreme high risk score and unplanned readmissions. and hospitalizations to check if potential [place] [facility] services are needed.  This patient is in the [facility] SNP program and this external care management program will follow up with this patient.  For questions contact:    Natividad Brood, RN BSN [facility] Liaison  [phone] business mobile phone Toll free office [phone]  Fax number: [phone] [email or web address]  [email or web address]

## 2018-11-07 NOTE — Transfer of Care (Signed)
Immediate Anesthesia Transfer of Care Note  Patient: Trenton Passow.  Procedure(s) Performed: ESOPHAGOGASTRODUODENOSCOPY (EGD) WITH PROPOFOL (N/A ) BIOPSY HOT HEMOSTASIS (ARGON PLASMA COAGULATION/BICAP) (N/A )  Patient Location: Endoscopy Unit  Anesthesia Type:MAC  Level of Consciousness: drowsy and patient cooperative  Airway & Oxygen Therapy: Patient Spontanous Breathing and Patient connected to nasal cannula oxygen  Post-op Assessment: Report given to RN and Post -op Vital signs reviewed and stable  Post vital signs: Reviewed and stable  Last Vitals:  Vitals Value Taken Time  BP    Temp    Pulse 94 11/07/2018  1:51 PM  Resp 19 11/07/2018  1:51 PM  SpO2 93 % 11/07/2018  1:51 PM  Vitals shown include unvalidated device data.  Last Pain:  Vitals:   11/07/18 1302  TempSrc: Oral  PainSc: 0-No pain         Complications: No apparent anesthesia complications

## 2018-11-07 NOTE — Op Note (Signed)
[facility] Patient Name: [name] Procedure Date : 11/07/2018 MRN: [id number] Attending MD: Carol Ada , MD Date of Birth: 1946/03/14 CSN: [id number] Age: 73 Admit Type: Inpatient Procedure:                Small bowel enteroscopy Indications:              Iron deficiency anemia Providers:                Carol Ada, MD, Jeanella Cara, RN, Ladona Ridgel, Technician, Elspeth Cho Tech.,                            Technician Referring MD:              Medicines:                Propofol per Anesthesia Complications:            No immediate complications. Estimated Blood Loss:     Estimated blood loss was minimal. Procedure:                Pre-Anesthesia Assessment:                           - Prior to the procedure, a History and Physical                            was performed, and patient medications and                            allergies were reviewed. The patient's tolerance of                            previous anesthesia was also reviewed. The risks                            and benefits of the procedure and the sedation                            options and risks were discussed with the patient.                            All questions were answered, and informed consent                            was obtained. Prior Anticoagulants: The patient has                            taken Eliquis (apixaban), last dose was 2 days                            prior to procedure. ASA Grade Assessment: III - A  patient with severe systemic disease. After                            reviewing the risks and benefits, the patient was                            deemed in satisfactory condition to undergo the                            procedure.                           - Sedation was administered by an anesthesia                            professional. Deep sedation was attained.  After obtaining informed consent, the endoscope was                            passed under direct vision. Throughout the                            procedure, the patient's blood pressure, pulse, and                            oxygen saturations were monitored continuously. The                            PCF-H190DL ([id number]) Olympus pediatric colonoscope                            was introduced through the mouth, and advanced to                            the proximal jejunum. After obtaining informed                            consent, the endoscope was passed under direct                            vision. Throughout the procedure, the patient's                            blood pressure, pulse, and oxygen saturations were                            monitored continuously.The upper GI endoscopy was                            accomplished without difficulty. The patient                            tolerated the procedure well. Scope In: Scope Out: Findings:      The esophagus was normal.      Patchy moderate inflammation characterized by erythema and friability  was found in the gastric antrum. Biopsies were taken with a cold forceps       for histology.      A single small angiodysplastic lesion without bleeding was found in the       fourth portion of the duodenum. Coagulation for tissue destruction using       monopolar probe was successful.      The examined jejunum was normal. Impression:               - Normal esophagus.                           - Gastritis. Biopsied.                           - A single non-bleeding angiodysplastic lesion in                            the duodenum. Treated with a monopolar probe.                           - Normal examined jejunum. Recommendation:           - Return patient to hospital ward for ongoing care.                           - Resume regular diet.                           - Continue present medications.                            - Await pathology results.                           - Return to GI clinic in 2 weeks.                           - Restart Eliquis in 2 days. Procedure Code(s):        --- Professional ---                           [phone], Small intestinal endoscopy, enteroscopy                            beyond second portion of duodenum, not including                            ileum; with ablation of tumor(s), polyp(s), or                            other lesion(s) not amenable to removal by hot                            biopsy forceps, bipolar cautery or snare technique Diagnosis Code(s):        --- Professional ---  K29.70, Gastritis, unspecified, without bleeding                           K31.819, Angiodysplasia of stomach and duodenum                            without bleeding                           D50.9, Iron deficiency anemia, unspecified CPT copyright 2019 American Medical Association. All rights reserved. The codes documented in this report are preliminary and upon coder review may  be revised to meet current compliance requirements. Carol Ada, MD Carol Ada, MD 11/07/2018 1:51:08 PM This report has been signed electronically. Number of Addenda: 0

## 2018-11-07 NOTE — Progress Notes (Signed)
Hypoglycemic Event  CBG: 63  Treatment: D50 25 mL (12.5 gm)  Symptoms: None  Follow-up CBG: [id number] CBG Result:109  Possible Reasons for Event: Inadequate meal intake  Comments/MD notified:patient responded to treatment no need to notify MD at this time.  Nursing staff to continue to monitor.      Felecia Jan

## 2018-11-07 NOTE — Progress Notes (Signed)
Subjective:  Per pt =" I'm going home to eat some food at [employer] '', no cos ,sp EGD nl esophag. Gastritis bx  , single Non bleeding angioplastic lesion in Duodenum . "Restart Eliquis in 2 days" per GI  Objective Vital signs in last 24 hours: Vitals:   11/07/18 1125 11/07/18 1302 11/07/18 1351 11/07/18 1400  BP: (!) 139/91 (!) 166/81 99/68 122/79  Pulse: 92  96 93  Resp: 16 19 17  (!) 31  Temp: 98.5 F (36.9 C) 98.7 F (37.1 C) 97.9 F (36.6 C)   TempSrc: Oral Oral Oral   SpO2: 96% 95% 95% 94%  Weight:  86 kg    Height:  5\' 10"  (1.778 m)     Weight change: 0.394 kg  Physical Exam: General:  Alert  NAD  Heart: RRR, no r,m, g Lungs: decr. BS  bilat  , non labored  breathing  Abdomen: BS pos. , soft, NT, ND Extremities: Bilat. 1+ pedal edema ialysis Access:  R IJ PC   OP Dialysis Orders:[facility] T,Th,S 3.5 hrs 180NRe 350/500  82 kg 2.0 K/2.5 Ca  -No Heparin -Mircera 200 mcg IV q 2 weeks (dose not started yet) -Venofer 100 mg IV X 5 doses (3/5 doses)   Problem/Plan: 1. Symptomatic Anemia in setting of heme + stools/NOAC. HGB 6.7 on adm Has received 2 units PRBCs. HGB now 9.1. GI consult per primary prior to resuming Apixaban.EGD today =Gastritis bx  , single Non bleeding angioplastic lesion in Duodenum . "Restart Eliquis in 2 days" per GI may consider bronchoscopy; some hemoptysis since diagnosis and we've been attributing the DVT + PE on anticoagulation. But pauciimmunie GN can also cause hemoptysis and this is the cause then TPE is indicated. 2. H/O DVT on Apixiban-Had been on ASA/Apixiban per cardiology recs however stopped ASA 11/04/18 after HGB 6.8 reported. Apixaban on hold. Per primary.  3. 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 SCr 4.47-5.18. S/P Rituxan 1000 mg 11/02/18. Oral prednisone taper. Has HD at [facility] T,Th,S.//  HD here sat /5/09 , Scr today 3.79 ,. K+ 3.9 No heparin. Has pork allergy on med list but  apparently gets [facility] flushed with heparin without incident.  - Last HD on 5/9 with 3.5hr treatment and 1.9L net UF. - Next HD tomor .Tuesday if he's still here 4. Hypertension/volume - Issue with volume removal at OP center D/T hypotension. started midodrine 10 mg PO TIW, lower machine temperature to 36 degrees. Add UFP 2. SBP currently in 99 s after egd  Procedure . 5. Anemia -hb 9.1 As noted above. Given  Aranesp 200 mcg IV with HD 5/09 . Continue venofer load. DC oral iron. 6. Metabolic bone disease - Ca 7.5 C Ca 9.02  No binders yet. Continue VDRA.  7. Nutrition -Albumin 2.1 Add prostat, renal vits.  8. DM-per primary  Ernest Haber, PA-C [facility] Bee[facility] [phone] 11/07/2018,2:32 PM  LOS: 2 days   Labs: Basic Metabolic Panel: Recent Labs  Lab 11/04/18 1600 11/05/18 0702 11/07/18 0609  NA 141 141 135  K 4.5 4.8 3.9  CL 104 105 97*  CO2 24 27 27   GLUCOSE 247* 97 70  BUN 59* 70* 54*  CREATININE 4.08* 4.33* 3.79*  CALCIUM 7.5* 7.8* 7.5*   Liver Function Tests: Recent Labs  Lab 11/02/18 0911 11/04/18 1600 11/05/18 0702  AST 33 37 33  ALT 30 29 29   ALKPHOS 43 42 33*  BILITOT 0.8 0.9 1.3*  PROT  4.7* 4.6* 4.8*  ALBUMIN 1.9* 2.0* 2.1*   No results for input(s): LIPASE, AMYLASE in the last 168 hours. No results for input(s): AMMONIA in the last 168 hours. CBC: Recent Labs  Lab 11/01/18 0906 11/04/18 0953 11/04/18 1600  11/05/18 0702 11/06/18 0655 11/07/18 0609  WBC 3.0* 3.4* 2.3*  --  4.5 3.9* 4.7  NEUTROABS 2,604 2,917 2.0  --   --   --   --   HGB 7.6* 6.8* 6.4*   < > 9.0* 8.9* 9.1*  HCT 23.5* 20.8* 20.9*   < > 27.7* 27.5* 27.1*  MCV 92.9 92.4 98.6  --  94.5 93.2 92.5  PLT 99* 107* 99*  --  101* 88* 88*   < > = values in this interval not displayed.   Cardiac Enzymes: Recent Labs  Lab 11/04/18 1600  TROPONINI 0.04*   CBG: Recent Labs  Lab 11/06/18 1614 11/06/18 2124 11/07/18 0616 11/07/18 0648 11/07/18 1126  GLUCAP 96  84 63* 109* 70    Studies/Results: No results found. Medications: . sodium chloride    . ferric gluconate (FERRLECIT/NULECIT) IV Stopped (11/05/18 1823)   . sodium chloride   Intravenous Once  . atorvastatin  80 mg Oral QHS  . calcium acetate  667 mg Oral TID WC  . Chlorhexidine Gluconate Cloth  6 each Topical Q0600  . [START ON 11/12/2018] darbepoetin (ARANESP) injection - DIALYSIS  200 mcg Intravenous Q Sat-HD  . docusate sodium  100 mg Oral Daily  . ezetimibe  10 mg Oral Daily  . ferrous sulfate  325 mg Oral Daily  . insulin aspart  0-5 Units Subcutaneous QHS  . insulin aspart  0-9 Units Subcutaneous TID WC  . insulin glargine  7 Units Subcutaneous Daily  . ipratropium-albuterol  3 mL Nebulization Q6H  . metoprolol succinate  25 mg Oral Daily  . midodrine  10 mg Oral Q T,Th,Sa-HD  . pantoprazole  40 mg Oral Daily  . predniSONE  60 mg Oral Q breakfast   Followed by  . [START ON 11/09/2018] predniSONE  50 mg Oral Q breakfast   Followed by  . [START ON 11/16/2018] predniSONE  40 mg Oral Q breakfast   Followed by  . [START ON 11/23/2018] predniSONE  30 mg Oral Q breakfast   Followed by  . [START ON 11/30/2018] predniSONE  20 mg Oral Q breakfast

## 2018-11-07 NOTE — Progress Notes (Signed)
[facility]                                                                              Patient Demographics  [name], is a 73 y.o. male, DOB - September 11, 1945, ION:[id number]  Admit date - 11/04/2018   Admitting Physician Elwyn Reach, MD  Outpatient Primary MD for the patient is [name], MD  Outpatient specialists:   LOS - 2  days   Medical records reviewed and are as summarized below:    Chief Complaint  Patient presents with   needs blood transfusion    sent over from PCP       Brief summary   Patient is a 73 year old male with ESRD on hemodialysis TTS, bladder CA, CAD, diabetes, osteoarthritis, hypertension, PAD, diabetes recent DVT in 09/2018 on Eliquis presented with generalized weakness.  Patient was sent by his PCP due to symptomatic anemia needing blood transfusion.  Hemoglobin was 6.4. Per admitting physician. Dr Jonelle Sidle, was guaiac positive.  COVID-19 negative  Assessment & Plan    Principal Problem:   Symptomatic anemia, upper GI bleed -Hemoglobin 6.4 at the time of admission.  Poor historian, mentioned dark stools -Patient has received 2 units of packed RBC transfusion, H&H stable -Patient is also on Eliquis, was found to have right DVT during previous admission in 09/2018. -During previous admission, Plavix was discontinued after discussion with vascular surgery, was on Plavix for PAD.  Patient was placed on aspirin and Eliquis with cardiology and vascular surgery recommendations.  Due to history of interstitial lung disease and concern for contrast nephropathy, concern was that patient may have had PE in 09/2018 and hence was continued on apixaban. -Patient had endoscopy and colonoscopy in 06/2016 by Dr. Benson Norway, colonoscopy showed multiple polyps. -GI consulted, EGD today. Continue to hold Eliquis. -Post GI work-up, will need decision regarding anticoagulation.  Patient is at high risk of rebleeding if remains on Eliquis and  aspirin.  Active Problems: ESRD on hemodialysis Hemodialysis TTS, nephrology following  Tobacco abuse -Nicotine cessation consult, continue nicotine patch    Hyperlipidemia -Continue Lipitor  GERD -Continue PPI  Chronic thrombocytopenia, pancytopenia -Platelets stable, follow closely  DVT right leg 09/2018 -Eliquis currently on hold  Code Status: Full CODE STATUS DVT Prophylaxis:   SCD's Family Communication: Discussed in detail with the patient, all imaging results, lab results explained to the patient and pt's daughter.  Disposition Plan: Patient presented with symptomatic anemia hemoglobin 6.4 on aspirin and Eliquis (needed for acute DVT found in 09/2018), GI bleed.  Needs GI evaluation/endoscopy before Eliquis can be safely resumed.  Time Spent in minutes 25-minute  Procedures:  None   Consultants:   Renal  GI   Antimicrobials:   Anti-infectives (From admission, onward)   None         Medications  Scheduled Meds:  [MAR Hold] sodium chloride   Intravenous Once   [MAR Hold] aspirin  81 mg Oral Daily   [MAR Hold] atorvastatin  80 mg Oral QHS   [MAR Hold] calcium acetate  667 mg Oral TID WC   [MAR Hold] Chlorhexidine Gluconate Cloth  6 each Topical Q0600   [  MAR Hold] darbepoetin (ARANESP) injection - DIALYSIS  200 mcg Intravenous Q Sat-HD   [MAR Hold] docusate sodium  100 mg Oral Daily   [MAR Hold] ezetimibe  10 mg Oral Daily   [MAR Hold] ferrous sulfate  325 mg Oral Daily   [MAR Hold] insulin aspart  0-5 Units Subcutaneous QHS   [MAR Hold] insulin aspart  0-9 Units Subcutaneous TID WC   [MAR Hold] insulin glargine  7 Units Subcutaneous Daily   [MAR Hold] ipratropium-albuterol  3 mL Nebulization Q6H   [MAR Hold] metoprolol succinate  25 mg Oral Daily   [MAR Hold] midodrine  10 mg Oral Q T,Th,Sa-HD   [MAR Hold] pantoprazole  40 mg Oral Daily   [MAR Hold] predniSONE  60 mg Oral Q breakfast   Followed by   [[facility] Hold] predniSONE  50 mg  Oral Q breakfast   Followed by   [facility] Hold] predniSONE  40 mg Oral Q breakfast   Followed by   [facility] Hold] predniSONE  30 mg Oral Q breakfast   Followed by   [facility] Hold] predniSONE  20 mg Oral Q breakfast   Continuous Infusions:  [MAR Hold] sodium chloride     sodium chloride 20 mL/hr at 11/07/18 1304   [MAR Hold] ferric gluconate (FERRLECIT/NULECIT) IV Stopped (11/05/18 1823)   PRN Meds:.[MAR Hold] ondansetron **OR** [MAR Hold] ondansetron (ZOFRAN) IV, [MAR Hold] oxyCODONE-acetaminophen **AND** [MAR Hold] oxyCODONE      Subjective:   [name] was seen and examined today.  No new complaints except coughing and slight hemoptysis.  No dizziness, lightheadedness, chest pain or shortness of breath.  No fevers.  Patient denies N/V/D/C, new weakness, numbess, tingling. No acute events overnight.    Objective:   Vitals:   11/06/18 1956 11/07/18 0414 11/07/18 1125 11/07/18 1302  BP: (!) 153/88 (!) 145/96 (!) 139/91 (!) 166/81  Pulse: 67 81 92   Resp: 20 20 16 19   Temp: (!) 97.3 F (36.3 C) 97.9 F (36.6 C) 98.5 F (36.9 C) 98.7 F (37.1 C)  TempSrc: Oral  Oral Oral  SpO2: 98% 95% 96% 95%  Weight:  85.6 kg  86 kg  Height:    5\' 10"  (1.778 m)    Intake/Output Summary (Last 24 hours) at 11/07/2018 1328 Last data filed at 11/07/2018 0519 Gross per 24 hour  Intake 720 ml  Output --  Net 720 ml     Wt Readings from Last 3 Encounters:  11/07/18 86 kg  11/02/18 83.9 kg  10/27/18 82.9 kg   Physical Exam  General: Alert and oriented x 3, NAD, hard of hearing  Eyes:  HEENT:    Cardiovascular: S1 S2 clear, no murmurs, RRR. No pedal edema b/l  Respiratory: CTAB, no wheezing, rales or rhonchi  Gastrointestinal: Soft, nontender, nondistended, NBS  Ext: no pedal edema bilaterally  Neuro: no new deficits  Musculoskeletal: No cyanosis, clubbing  Skin: No rashes  Psych: Normal affect and demeanor, alert and oriented x3    Data Reviewed:  I have personally  reviewed following labs and imaging studies  Micro Results Recent Results (from the past 240 hour(s))  MRSA PCR Screening     Status: None   Collection Time: 11/05/18  1:41 AM  Result Value Ref Range Status   MRSA by PCR NEGATIVE NEGATIVE Final    Comment:        The GeneXpert MRSA Assay (FDA approved for NASAL specimens only), is one component of a comprehensive MRSA colonization surveillance program. It  is not intended to diagnose MRSA infection nor to guide or monitor treatment for MRSA infections. Performed at [facility], [place] [address], [place], [place] [address]   Urine Culture     Status: None   Collection Time: 11/05/18  6:02 AM  Result Value Ref Range Status   Specimen Description URINE, RANDOM  Final   Special Requests NONE  Final   Culture   Final    NO GROWTH Performed at [facility], [place] [address], [place], [place] [address]    Report Status 11/06/2018 FINAL  Final  SARS Coronavirus 2 (CEPHEID - Performed in [place] hospital lab), Hosp Order     Status: None   Collection Time: 11/07/18 11:25 AM  Result Value Ref Range Status   SARS Coronavirus 2 NEGATIVE NEGATIVE Final    Comment: (NOTE) If result is NEGATIVE SARS-CoV-2 target nucleic acids are NOT DETECTED. The SARS-CoV-2 RNA is generally detectable in upper and lower  respiratory specimens during the acute phase of infection. The lowest  concentration of SARS-CoV-2 viral copies this assay can detect is 250  copies / mL. A negative result does not preclude SARS-CoV-2 infection  and should not be used as the sole basis for treatment or other  patient management decisions.  A negative result may occur with  improper specimen collection / handling, submission of specimen other  than nasopharyngeal swab, presence of viral mutation(s) within the  areas targeted by this assay, and inadequate number of viral copies  (<250 copies / mL). A negative result must be combined with clinical   observations, patient history, and epidemiological information. If result is POSITIVE SARS-CoV-2 target nucleic acids are DETECTED. The SARS-CoV-2 RNA is generally detectable in upper and lower  respiratory specimens dur ing the acute phase of infection.  Positive  results are indicative of active infection with SARS-CoV-2.  Clinical  correlation with patient history and other diagnostic information is  necessary to determine patient infection status.  Positive results do  not rule out bacterial infection or co-infection with other viruses. If result is PRESUMPTIVE POSTIVE SARS-CoV-2 nucleic acids MAY BE PRESENT.   A presumptive positive result was obtained on the submitted specimen  and confirmed on repeat testing.  While 2019 novel coronavirus  (SARS-CoV-2) nucleic acids may be present in the submitted sample  additional confirmatory testing may be necessary for epidemiological  and / or clinical management purposes  to differentiate between  SARS-CoV-2 and other Sarbecovirus currently known to infect humans.  If clinically indicated additional testing with an alternate test  methodology [phone]) is advised. The SARS-CoV-2 RNA is generally  detectable in upper and lower respiratory sp ecimens during the acute  phase of infection. The expected result is Negative. Fact Sheet for Patients:  StrictlyIdeas.no Fact Sheet for Healthcare Providers: BankingDealers.co.za This test is not yet approved or cleared by the [place] FDA and has been authorized for detection and/or diagnosis of SARS-CoV-2 by FDA under an Emergency Use Authorization (EUA).  This EUA will remain in effect (meaning this test can be used) for the duration of the COVID-19 declaration under Section 564(b)(1) of the Act, 21 U.S.C. section 360bbb-3(b)(1), unless the authorization is terminated or revoked sooner. Performed at [facility], [place] [address],  [place], [place] [address]     Radiology Reports Dg Chest 1 View  Result Date: 10/17/2018 CLINICAL DATA:  73 year old male with a history acute kidney injury EXAM: CHEST  1 VIEW COMPARISON:  No  chest x-ray for comparison. Prior CT chest 03/02/2018 FINDINGS: Cardiomediastinal silhouette within normal limits. Fullness in the central vasculature. Interlobular septal thickening. Opacity at the medial aspect of the right upper lobe, appears relatively similar to the scout image from the CT dated 03/02/2018 and the scout image from thoracic spine CT 05/05/2018, likely accentuated by slight right rotation and low lung volumes. No confluent airspace disease.  No pneumothorax or pleural effusion. Right IJ tunneled hemodialysis catheter appears to terminate superior vena cava. IMPRESSION: Reticular opacities may reflect developing fibrosis, with early changes on the prior chest CT of 03/02/2018, however, mild edema could have this appearance. Right IJ tunneled hemodialysis catheter with the tip appearing to terminate superior vena cava. Electronically Signed   By: Corrie Mckusick D.O.   On: 10/17/2018 16:36   Dg Chest 2 View  Result Date: 11/04/2018 CLINICAL DATA:  73 year old male with acute shortness of breath. EXAM: CHEST - 2 VIEW COMPARISON:  10/26/2018 and prior radiographs FINDINGS: The cardiomediastinal silhouette is unremarkable. RIGHT IJ central venous catheter again noted with tip overlying the LOWER SVC. Chronic interstitial opacities again identified. There is no evidence of focal airspace disease, pulmonary edema, suspicious pulmonary nodule/mass, pleural effusion, or pneumothorax. No acute bony abnormalities are identified. IMPRESSION: 1. No evidence of acute cardiopulmonary disease. 2. Chronic interstitial opacities. Electronically Signed   By: Margarette Canada M.D.   On: 11/04/2018 16:48   Dg Chest 2 View  Result Date: 10/26/2018 CLINICAL DATA:  Shortness of breath, swelling in legs EXAM: CHEST - 2 VIEW  COMPARISON:  10/17/2018 FINDINGS: RIGHT jugular Port-A-Cath with tip projecting over SVC. Normal heart size, mediastinal contours, and pulmonary vascularity. Atherosclerotic calcifications aorta. Chronic interstitial infiltrates again identified. Calcified granuloma lower LEFT chest. No segmental consolidation, pleural effusion or pneumothorax. Bones unremarkable. IMPRESSION: Chronic interstitial infiltrates throughout both lungs, slightly greater at bases, favor chronic interstitial lung disease. No definite new or segmental infiltrate identified. Electronically Signed   By: Lavonia Dana M.D.   On: 10/26/2018 15:08   Ir Fluoro Guide Cv Line Right  Result Date: 10/17/2018 CLINICAL DATA:  End-stage renal disease and need for tunneled hemodialysis catheter. EXAM: TUNNELED CENTRAL VENOUS HEMODIALYSIS CATHETER PLACEMENT WITH ULTRASOUND AND FLUOROSCOPIC GUIDANCE ANESTHESIA/SEDATION: 1.0 mg IV Versed; 25 mcg IV Fentanyl. Total Moderate Sedation Time:   15 minutes. The patient's level of consciousness and physiologic status were continuously monitored during the procedure by Radiology nursing. MEDICATIONS: 2 g IV Ancef. FLUOROSCOPY TIME:  54 seconds.  12.9 mGy. PROCEDURE: The procedure, risks, benefits, and alternatives were explained to the patient. Questions regarding the procedure were encouraged and answered. The patient understands and consents to the procedure. A timeout was performed prior to initiating the procedure. The right neck and chest were prepped with chlorhexidine in a sterile fashion, and a sterile drape was applied covering the operative field. Maximum barrier sterile technique with sterile gowns and gloves were used for the procedure. Local anesthesia was provided with 1% lidocaine. Ultrasound was used to confirm patency of the right internal jugular vein. After creating a small venotomy incision, a 21 gauge needle was advanced into the right internal jugular vein under direct, real-time ultrasound  guidance. Ultrasound image documentation was performed. After securing guidewire access, an 8 Fr dilator was placed. A J-wire was kinked to measure appropriate catheter length. A Palindrome tunneled hemodialysis catheter measuring 23 cm from tip to cuff was chosen for placement. This was tunneled in a retrograde fashion from the chest wall to the venotomy incision. At  the venotomy, serial dilatation was performed and a 16 Fr peel-away sheath was placed over a guidewire. The catheter was then placed through the sheath and the sheath removed. Final catheter positioning was confirmed and documented with a fluoroscopic spot image. The catheter was aspirated, flushed with saline, and injected with appropriate volume heparin dwells. The venotomy incision was closed with subcutaneous 3-0 Monocryl and subcuticular 4-0 Vicryl. Dermabond was applied to the incision. The catheter exit site was secured with 0-Prolene retention sutures. COMPLICATIONS: None.  No pneumothorax. FINDINGS: After catheter placement, the tip lies in the right atrium. The catheter aspirates normally and is ready for immediate use. IMPRESSION: Placement of tunneled hemodialysis catheter via the right internal jugular vein. The catheter tip lies in the right atrium. The catheter is ready for immediate use. Electronically Signed   By: Aletta Edouard M.D.   On: 10/17/2018 16:41   Ir US Guide Vasc Access Right  Result Date: 10/17/2018 CLINICAL DATA:  End-stage renal disease and need for tunneled hemodialysis catheter. EXAM: TUNNELED CENTRAL VENOUS HEMODIALYSIS CATHETER PLACEMENT WITH ULTRASOUND AND FLUOROSCOPIC GUIDANCE ANESTHESIA/SEDATION: 1.0 mg IV Versed; 25 mcg IV Fentanyl. Total Moderate Sedation Time:   15 minutes. The patient's level of consciousness and physiologic status were continuously monitored during the procedure by Radiology nursing. MEDICATIONS: 2 g IV Ancef. FLUOROSCOPY TIME:  54 seconds.  12.9 mGy. PROCEDURE: The procedure, risks,  benefits, and alternatives were explained to the patient. Questions regarding the procedure were encouraged and answered. The patient understands and consents to the procedure. A timeout was performed prior to initiating the procedure. The right neck and chest were prepped with chlorhexidine in a sterile fashion, and a sterile drape was applied covering the operative field. Maximum barrier sterile technique with sterile gowns and gloves were used for the procedure. Local anesthesia was provided with 1% lidocaine. Ultrasound was used to confirm patency of the right internal jugular vein. After creating a small venotomy incision, a 21 gauge needle was advanced into the right internal jugular vein under direct, real-time ultrasound guidance. Ultrasound image documentation was performed. After securing guidewire access, an 8 Fr dilator was placed. A J-wire was kinked to measure appropriate catheter length. A Palindrome tunneled hemodialysis catheter measuring 23 cm from tip to cuff was chosen for placement. This was tunneled in a retrograde fashion from the chest wall to the venotomy incision. At the venotomy, serial dilatation was performed and a 16 Fr peel-away sheath was placed over a guidewire. The catheter was then placed through the sheath and the sheath removed. Final catheter positioning was confirmed and documented with a fluoroscopic spot image. The catheter was aspirated, flushed with saline, and injected with appropriate volume heparin dwells. The venotomy incision was closed with subcutaneous 3-0 Monocryl and subcuticular 4-0 Vicryl. Dermabond was applied to the incision. The catheter exit site was secured with 0-Prolene retention sutures. COMPLICATIONS: None.  No pneumothorax. FINDINGS: After catheter placement, the tip lies in the right atrium. The catheter aspirates normally and is ready for immediate use. IMPRESSION: Placement of tunneled hemodialysis catheter via the right internal jugular vein. The  catheter tip lies in the right atrium. The catheter is ready for immediate use. Electronically Signed   By: Aletta Edouard M.D.   On: 10/17/2018 16:41   US Biopsy (kidney)  Result Date: 10/10/2018 INDICATION: 32 year old with acute kidney injury. Request for random renal biopsy. EXAM: ULTRASOUND-GUIDED LEFT RENAL BIOPSY MEDICATIONS: None. ANESTHESIA/SEDATION: Moderate (conscious) sedation was employed during this procedure. A total of Versed 1.5  mg and Fentanyl 50 mcg was administered intravenously. Moderate Sedation Time: 17 minutes. The patient's level of consciousness and vital signs were monitored continuously by radiology nursing throughout the procedure under my direct supervision. FLUOROSCOPY TIME:  None COMPLICATIONS: None immediate. PROCEDURE: Informed written consent was obtained from the patient after a thorough discussion of the procedural risks, benefits and alternatives. All questions were addressed. Maximal Sterile Barrier Technique was utilized including caps, mask, sterile gowns, sterile gloves, sterile drape, hand hygiene and skin antiseptic. A timeout was performed prior to the initiation of the procedure. Both kidneys were evaluated with ultrasound. The left kidney was selected for biopsy. Left flank was prepped with chlorhexidine and sterile field was created. Skin and soft tissues were anesthetized with 1% lidocaine. Using ultrasound guidance, 16 gauge core biopsy needle was directed into the left kidney lower pole. A total of 2 core biopsies were obtained. Specimens placed in saline. Bandage placed over the puncture site. FINDINGS: Negative for hydronephrosis. Core biopsies obtained from the left kidney lower pole. Negative for bleeding or hematoma formation following the core biopsies. IMPRESSION: Ultrasound-guided core biopsies of the left kidney lower pole. Electronically Signed   By: Markus Daft M.D.   On: 10/10/2018 17:15   Vas [place] Lower Extremity Venous (dvt)  Result Date:  10/28/2018  Lower Venous Study Indications: Swelling, SOB, and positive D-Dimer. Other Indications: Patient c/o shortness of breath and leg swelling. Patient                    inpatient hospital x 24 days from 09/27/18-10/20/18. Anticoagulation: Plavix. Performing Technologist: Salvadore Dom RVT, RDCS (AE), RDMS  Examination Guidelines: A complete evaluation includes B-mode imaging, spectral Doppler, color Doppler, and power Doppler as needed of all accessible portions of each vessel. Bilateral testing is considered an integral part of a complete examination. Limited examinations for reoccurring indications may be performed as noted.  +---------+---------------+---------+-----------+------------------+-------+  RIGHT     Compressibility Phasicity Spontaneity Properties         Summary  +---------+---------------+---------+-----------+------------------+-------+  CFV       Full            Yes       Yes                                     +---------+---------------+---------+-----------+------------------+-------+  SFJ       Full            Yes       Yes                                     +---------+---------------+---------+-----------+------------------+-------+  FV Prox   Partial         No        No                                      +---------+---------------+---------+-----------+------------------+-------+  FV Mid    None            No        No          brightly echogenic Chronic  +---------+---------------+---------+-----------+------------------+-------+  FV Distal None            No  No          brightly echogenic Chronic  +---------+---------------+---------+-----------+------------------+-------+  PFV       Full                                                              +---------+---------------+---------+-----------+------------------+-------+  POP       Partial         Yes       Yes         brightly echogenic Chronic  +---------+---------------+---------+-----------+------------------+-------+   PTV       Full            Yes                                               +---------+---------------+---------+-----------+------------------+-------+  PERO      Full            Yes                                               +---------+---------------+---------+-----------+------------------+-------+  Gastroc   Full                                                              +---------+---------------+---------+-----------+------------------+-------+  GSV       Full            Yes       Yes                                     +---------+---------------+---------+-----------+------------------+-------+  +---------+---------------+---------+-----------+----------+-------+  LEFT      Compressibility Phasicity Spontaneity Properties Summary  +---------+---------------+---------+-----------+----------+-------+  CFV       Full            Yes       Yes                             +---------+---------------+---------+-----------+----------+-------+  SFJ       Full            Yes       Yes                             +---------+---------------+---------+-----------+----------+-------+  FV Prox   Full            Yes       Yes                             +---------+---------------+---------+-----------+----------+-------+  FV Mid    Full            Yes  Yes                             +---------+---------------+---------+-----------+----------+-------+  FV Distal Full            Yes       Yes                             +---------+---------------+---------+-----------+----------+-------+  PFV       Full                                                      +---------+---------------+---------+-----------+----------+-------+  POP       Full            Yes       Yes                             +---------+---------------+---------+-----------+----------+-------+  PTV       Full            Yes       Yes                             +---------+---------------+---------+-----------+----------+-------+  PERO      Full             Yes       Yes                             +---------+---------------+---------+-----------+----------+-------+  Gastroc   Full                                                      +---------+---------------+---------+-----------+----------+-------+  GSV       Full            Yes       Yes                             +---------+---------------+---------+-----------+----------+-------+  Findings reported to [place] at Dr. Antony Contras office at 11:30am.  Summary:  Right: Findings consistent with chronic deep vein thrombosis involving the right femoral vein, and right popliteal vein. No cystic structure found in the popliteal fossa. All other veins visualized appear fully compressible and demonstrate appropriate Doppler characteristics.  Left: No evidence of deep vein thrombosis in the lower extremity. No indirect evidence of obstruction proximal to the inguinal ligament. No cystic structure found in the popliteal fossa.  *See table(s) above for measurements and observations. Electronically signed by [name] MD on 10/28/2018 at 1:37:31 PM.    Final     Lab Data:  CBC: Recent Labs  Lab 11/01/18 0906 11/04/18 0953 11/04/18 1600 11/04/18 2300 11/05/18 0702 11/06/18 0655 11/07/18 0609  WBC 3.0* 3.4* 2.3*  --  4.5 3.9* 4.7  NEUTROABS 2,604 2,917 2.0  --   --   --   --   HGB 7.6* 6.8* 6.4* 6.7* 9.0* 8.9* 9.1*  HCT 23.5* 20.8* 20.9* 21.5* 27.7* 27.5* 27.1*  MCV 92.9 92.4 98.6  --  94.5 93.2 92.5  PLT 99* 107* 99*  --  101* 88* 88*   Basic Metabolic Panel: Recent Labs  Lab 11/02/18 0911 11/04/18 1600 11/05/18 0702 11/07/18 0609  NA 138 141 141 135  K 4.7 4.5 4.8 3.9  CL 103 104 105 97*  CO2 26 24 27 27   GLUCOSE 194* 247* 97 70  BUN 62* 59* 70* 54*  CREATININE 3.85* 4.08* 4.33* 3.79*  CALCIUM 7.7* 7.5* 7.8* 7.5*   GFR: Estimated Creatinine Clearance: 17.7 mL/min (A) (by C-G formula based on SCr of 3.79 mg/dL (H)). Liver Function Tests: Recent Labs  Lab 11/02/18 0911 11/04/18 1600  11/05/18 0702  AST 33 37 33  ALT 30 29 29   ALKPHOS 43 42 33*  BILITOT 0.8 0.9 1.3*  PROT 4.7* 4.6* 4.8*  ALBUMIN 1.9* 2.0* 2.1*   No results for input(s): LIPASE, AMYLASE in the last 168 hours. No results for input(s): AMMONIA in the last 168 hours. Coagulation Profile: No results for input(s): INR, PROTIME in the last 168 hours. Cardiac Enzymes: Recent Labs  Lab 11/04/18 1600  TROPONINI 0.04*   BNP (last 3 results) No results for input(s): PROBNP in the last 8760 hours. HbA1C: No results for input(s): HGBA1C in the last 72 hours. CBG: Recent Labs  Lab 11/06/18 1614 11/06/18 2124 11/07/18 0616 11/07/18 0648 11/07/18 1126  GLUCAP 96 84 63* 109* 70   Lipid Profile: No results for input(s): CHOL, HDL, LDLCALC, TRIG, CHOLHDL, LDLDIRECT in the last 72 hours. Thyroid Function Tests: No results for input(s): TSH, T4TOTAL, FREET4, T3FREE, THYROIDAB in the last 72 hours. Anemia Panel: No results for input(s): VITAMINB12, FOLATE, FERRITIN, TIBC, IRON, RETICCTPCT in the last 72 hours. Urine analysis:    Component Value Date/Time   COLORURINE YELLOW 11/05/2018 0601   APPEARANCEUR CLEAR 11/05/2018 0601   LABSPEC 1.012 11/05/2018 0601   PHURINE 7.0 11/05/2018 0601   GLUCOSEU NEGATIVE 11/05/2018 0601   HGBUR LARGE (A) 11/05/2018 0601   BILIRUBINUR NEGATIVE 11/05/2018 0601   KETONESUR NEGATIVE 11/05/2018 0601   PROTEINUR 100 (A) 11/05/2018 0601   NITRITE NEGATIVE 11/05/2018 0601   LEUKOCYTESUR NEGATIVE 11/05/2018 0601     Phiona Ramnauth M.D. [facility] 11/07/2018, 1:28 PM  Pager: [phone] Between 7am to 7pm - call Pager - [phone]  After 7pm go to [email or web address] - password [email or web address]  Call night coverage person covering after 7pm

## 2018-11-07 NOTE — Anesthesia Procedure Notes (Signed)
Procedure Name: MAC Date/Time: 11/07/2018 1:20 PM Performed by: Kathryne Hitch, CRNA Pre-anesthesia Checklist: Patient identified, Emergency Drugs available, Suction available, Patient being monitored and Timeout performed Patient Re-evaluated:Patient Re-evaluated prior to induction Oxygen Delivery Method: Nasal cannula Preoxygenation: Pre-oxygenation with 100% oxygen Induction Type: IV induction Dental Injury: Teeth and Oropharynx as per pre-operative assessment

## 2018-11-07 NOTE — Anesthesia Postprocedure Evaluation (Signed)
Anesthesia Post Note  Patient: [name].  Procedure(s) Performed: ESOPHAGOGASTRODUODENOSCOPY (EGD) WITH PROPOFOL (N/A ) BIOPSY HOT HEMOSTASIS (ARGON PLASMA COAGULATION/BICAP) (N/A )     Patient location during evaluation: Endoscopy Anesthesia Type: MAC Level of consciousness: awake and alert Pain management: pain level controlled Vital Signs Assessment: post-procedure vital signs reviewed and stable Respiratory status: spontaneous breathing, nonlabored ventilation and respiratory function stable Cardiovascular status: stable and blood pressure returned to baseline Postop Assessment: no apparent nausea or vomiting Anesthetic complications: no    Last Vitals:  Vitals:   11/07/18 1351 11/07/18 1400  BP: 99/68 122/79  Pulse: 96 93  Resp: 17 (!) 31  Temp: 36.6 C   SpO2: 95% 94%    Last Pain:  Vitals:   11/07/18 1400  TempSrc:   PainSc: 0-No pain                 Adamae Ricklefs,W. EDMOND

## 2018-11-07 NOTE — Anesthesia Preprocedure Evaluation (Addendum)
Anesthesia Evaluation  Patient identified by MRN, date of birth, ID band Patient awake    Reviewed: Allergy & Precautions, H&P , NPO status , Patient's Chart, lab work & pertinent test results, reviewed documented beta blocker date and time   Airway Mallampati: II  TM Distance: >3 FB Neck ROM: Full    Dental no notable dental hx. (+) Edentulous Upper, Edentulous Lower, Dental Advisory Given   Pulmonary COPD,  COPD inhaler, former smoker,    Pulmonary exam normal breath sounds clear to auscultation       Cardiovascular hypertension, Pt. on medications and Pt. on home beta blockers + CAD, + Cardiac Stents and + Peripheral Vascular Disease   Rhythm:Regular Rate:Normal     Neuro/Psych negative neurological ROS  negative psych ROS   GI/Hepatic negative GI ROS, Neg liver ROS, GERD  ,  Endo/Other  diabetes, Type 1, Insulin Dependent  Renal/GU ESRFRenal disease  negative genitourinary   Musculoskeletal   Abdominal   Peds  Hematology  (+) Blood dyscrasia, anemia ,   Anesthesia Other Findings   Reproductive/Obstetrics negative OB ROS                            Anesthesia Physical Anesthesia Plan  ASA: III  Anesthesia Plan: MAC   Post-op Pain Management:    Induction: Intravenous  PONV Risk Score and Plan: 1 and Propofol infusion  Airway Management Planned: Nasal Cannula  Additional Equipment:   Intra-op Plan:   Post-operative Plan:   Informed Consent: I have reviewed the patients History and Physical, chart, labs and discussed the procedure including the risks, benefits and alternatives for the proposed anesthesia with the patient or authorized representative who has indicated his/her understanding and acceptance.     Dental advisory given  Plan Discussed with: CRNA  Anesthesia Plan Comments:         Anesthesia Quick Evaluation

## 2018-11-08 ENCOUNTER — Inpatient Hospital Stay (HOSPITAL_COMMUNITY): Payer: Medicare HMO

## 2018-11-08 ENCOUNTER — Other Ambulatory Visit: Payer: Self-pay

## 2018-11-08 DIAGNOSIS — J849 Interstitial pulmonary disease, unspecified: Secondary | ICD-10-CM

## 2018-11-08 DIAGNOSIS — R042 Hemoptysis: Secondary | ICD-10-CM

## 2018-11-08 LAB — GLUCOSE, CAPILLARY
Glucose-Capillary: 77 mg/dL (ref 70–99)
Glucose-Capillary: 146 mg/dL — ABNORMAL HIGH (ref 70–99)
Glucose-Capillary: 164 mg/dL — ABNORMAL HIGH (ref 70–99)
Glucose-Capillary: 135 mg/dL — ABNORMAL HIGH (ref 70–99)

## 2018-11-08 LAB — BASIC METABOLIC PANEL
Anion gap: 14 (ref 5–15)
BUN: 63 mg/dL — ABNORMAL HIGH (ref 8–23)
CO2: 22 mmol/L (ref 22–32)
Calcium: 7.3 mg/dL — ABNORMAL LOW (ref 8.9–10.3)
Chloride: 98 mmol/L (ref 98–111)
Creatinine, Ser: 4.46 mg/dL — ABNORMAL HIGH (ref 0.61–1.24)
GFR calc Af Amer: 14 mL/min — ABNORMAL LOW (ref >60)
GFR calc non Af Amer: 12 mL/min — ABNORMAL LOW (ref >60)
Glucose, Bld: 80 mg/dL (ref 70–99)
Potassium: 4.1 mmol/L (ref 3.5–5.1)
Sodium: 134 mmol/L — ABNORMAL LOW (ref 135–145)

## 2018-11-08 LAB — CBC
HCT: 27.3 % — ABNORMAL LOW (ref 39.0–52.0)
Hemoglobin: 8.8 g/dL — ABNORMAL LOW (ref 13.0–17.0)
MCH: 30.2 pg (ref 26.0–34.0)
MCHC: 32.2 g/dL (ref 30.0–36.0)
MCV: 93.8 fL (ref 80.0–100.0)
Platelets: 82 10*3/uL — ABNORMAL LOW (ref 150–400)
RBC: 2.91 MIL/uL — ABNORMAL LOW (ref 4.22–5.81)
RDW: 21.9 % — ABNORMAL HIGH (ref 11.5–15.5)
WBC: 4.5 10*3/uL (ref 4.0–10.5)
nRBC: 0 % (ref 0.0–0.2)

## 2018-11-08 LAB — TSH: TSH: 3.168 u[IU]/mL (ref 0.350–4.500)

## 2018-11-08 MED ORDER — HEPARIN SODIUM (PORCINE) 1000 UNIT/ML IJ SOLN
INTRAMUSCULAR | Status: AC
  Filled 2018-11-08: qty 4

## 2018-11-08 MED ORDER — SODIUM CHLORIDE 0.9 % IV SOLN
125.0000 mg | INTRAVENOUS | Status: DC
  Administered 2018-11-08: 125 mg via INTRAVENOUS
  Filled 2018-11-08: qty 10

## 2018-11-08 NOTE — Progress Notes (Addendum)
PROGRESS NOTE    [name].  GGY:[id number] DOB: [birth year] DOA: 11/04/2018 PCP: [name], MD   Brief Narrative:  Patient is [name] 73 year old male with ESRD on hemodialysis TTS, bladder CA, CAD, diabetes, osteoarthritis, hypertension, PAD, diabetes recent DVT in 09/2018 on Eliquis presented with generalized weakness.  Patient was sent by his PCP due to symptomatic anemia needing blood transfusion.  Hemoglobin was 6.4. Per admitting physician. Dr Jonelle Sidle, was guaiac positive.  S/p EGD notable for gastritis and non bleeding angiodysplastic lesion.  Pulm was c/s today for concern for hemoptysis.  Discharge pending improvement in hemoptysis and plan for resumption of eliquis.  Assessment & Plan:   Principal Problem:   Symptomatic anemia Active Problems:   Hyperlipidemia   TOBACCO ABUSE   HYPERTENSION, BENIGN   Essential hypertension   CAD, NATIVE VESSEL   Type II diabetes mellitus with renal manifestations ([name])   GERD (gastroesophageal reflux disease)   ESRD on dialysis ([name])   GIB (gastrointestinal bleeding)   Hemoptysis   ILD (interstitial lung disease) ([name])  Symptomatic anemia, upper GI bleed -Hemoglobin 6.4 at the time of admission.  Poor historian, mentioned dark stools -Patient has received 2 units of packed RBC transfusion, H&H stable - Hb stable today - Patient was also on Eliquis, was found to have right DVT during previous admission in 09/2018. - During previous admission, Plavix was discontinued after discussion with vascular surgery, was on Plavix for PAD.  Patient was placed on aspirin and Eliquis with cardiology and vascular surgery recommendations.  Due to history of interstitial lung disease and concern for contrast nephropathy, concern was that patient may have had PE in 09/2018 and hence was continued on apixaban. -Patient had endoscopy and colonoscopy in 06/2016 by Dr. Benson Norway, colonoscopy showed multiple polyps. - Repeat EGD per Dr. Benson Norway on 5/11 with gastritis,  non bleeding angiodysplastic lesion which was treated.  Recommends follow up biopsy, per GI recommending restart eliquis in 2 days, but will hold for now in setting of hemoptysis below  -Post GI work-up, will need decision regarding anticoagulation.  Patient is at high risk of rebleeding if remains on Eliquis and aspirin.  Will discuss if plan for colonoscopy? - recommending this outpt, suspected source of bleeding was gastritis?  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 See pulm note from 5/12  Atrial Fibrillation vs flutter with RVR: noted on EKG from 5/12, I don't see any history of this.  Echo from 06/2018 with normal EF.  Follow add on TSH.  Continue metoprolol Rate now controlled Chadsvasc at least 3.  Anticoagulation on hold in setting of above. Outpatient cards follow up.  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  From pulm standpoint, at this point, deferring timing of restarting eliquis to GI (see Dr. Sudie Bailey note from 5/12).  No recommendation for  IVC filter yet unless he failed eliquis again.    DVT prophylaxis: SCD Code Status: full  Family Communication: none at bedside - called listed contact, no answer Disposition  Plan: pending s/o by pulm, GI, nephrology.  Decision regarding anticoagulation.   Consultants:   Pulm  Nephrology  GI  Procedures: Impression - Normal esophagus. - Gastritis. Biopsied. - [name] single non-bleeding angiodysplastic lesion in the duodenum. Treated with [name] monopolar probe. - Normal examined jejunum. Recommendation - Return patient to hospital ward for ongoing care. - Resume regular diet. - Continue present medications. - Await pathology results. - Return to GI clinic in 2 weeks. - Restart Eliquis in 2 days.  Antimicrobials:  Anti-infectives (From admission, onward)   None     Subjective: Asking to go home Coughing up blood Doesn't think this is Kurtis Anastasia big deal Says his DVT has been treated adequately (we discuss he hasn't been on anticoagulation long enough) Feels cold  Objective: Vitals:   11/08/18 1019 11/08/18 1128 11/08/18 1133 11/08/18 1214  BP: (!) 152/86  (!) 157/92 92/62  Pulse: 93 66 (!) 124 64  Resp: 18   20  Temp: 97.6 F (36.4 C)   98.3 F (36.8 C)  TempSrc: Oral   Oral  SpO2: 94% (!) 89% 97% 98%  Weight: 84.2 kg     Height:        Intake/Output Summary (Last 24 hours) at 11/08/2018 1806 Last data filed at 11/08/2018 1218 Gross per 24 hour  Intake 720 ml  Output 2101 ml  Net -1381 ml   Filed Weights   11/08/18 0456 11/08/18 0640 11/08/18 1019  Weight: 85.8 kg 86 kg 84.2 kg    Examination:  General exam: Appears calm and comfortable  Respiratory system: Clear to auscultation. Respiratory effort normal. Cardiovascular system: S1 & S2 heard, tachycardic Gastrointestinal system: Abdomen is nondistended, soft and nontender. Central nervous system: Alert and oriented. No focal neurological deficits. Extremities: no lee Skin: No rashes, lesions or ulcers Psychiatry: Judgement and insight appear normal. Mood & affect appropriate.     Data Reviewed: I have personally reviewed following labs and imaging studies  CBC: Recent Labs  Lab  11/04/18 0953 11/04/18 1600 11/04/18 2300 11/05/18 0702 11/06/18 0655 11/07/18 0609 11/08/18 0520  WBC 3.4* 2.3*  --  4.5 3.9* 4.7 4.5  NEUTROABS 2,917 2.0  --   --   --   --   --   HGB 6.8* 6.4* 6.7* 9.0* 8.9* 9.1* 8.8*  HCT 20.8* 20.9* 21.5* 27.7* 27.5* 27.1* 27.3*  MCV 92.4 98.6  --  94.5 93.2 92.5 93.8  PLT 107* 99*  --  101* 88* 88* 82*   Basic Metabolic Panel: Recent Labs  Lab 11/02/18 0911 11/04/18 1600 11/05/18 0702 11/07/18 0609 11/08/18 0520  NA 138 141 141 135 134*  K 4.7 4.5 4.8 3.9 4.1  CL 103 104 105 97* 98  CO2 26 24 27 27 22   GLUCOSE 194* 247* 97 70 80  BUN 62* 59* 70* 54* 63*  CREATININE 3.85* 4.08* 4.33* 3.79* 4.46*  CALCIUM 7.7* 7.5* 7.8* 7.5* 7.3*   GFR: Estimated Creatinine Clearance: 15 mL/min (Melady Chow) (by C-G formula based on SCr of 4.46 mg/dL (H)). Liver Function Tests: Recent Labs  Lab 11/02/18 0911 11/04/18 1600 11/05/18 0702  AST 33 37 33  ALT 30 29 29   ALKPHOS 43 42 33*  BILITOT 0.8 0.9 1.3*  PROT 4.7* 4.6* 4.8*  ALBUMIN 1.9* 2.0* 2.1*   No results for input(s):  LIPASE, AMYLASE in the last 168 hours. No results for input(s): AMMONIA in the last 168 hours. Coagulation Profile: No results for input(s): INR, PROTIME in the last 168 hours. Cardiac Enzymes: Recent Labs  Lab 11/04/18 1600  TROPONINI 0.04*   BNP (last 3 results) No results for input(s): PROBNP in the last 8760 hours. HbA1C: No results for input(s): HGBA1C in the last 72 hours. CBG: Recent Labs  Lab 11/07/18 1627 11/07/18 2043 11/08/18 0620 11/08/18 1222 11/08/18 1647  GLUCAP 93 178* 77 146* 164*   Lipid Profile: No results for input(s): CHOL, HDL, LDLCALC, TRIG, CHOLHDL, LDLDIRECT in the last 72 hours. Thyroid Function Tests: No results for input(s): TSH, T4TOTAL, FREET4, T3FREE, THYROIDAB in the last 72 hours. Anemia Panel: No results for input(s): VITAMINB12, FOLATE, FERRITIN, TIBC, IRON, RETICCTPCT in the last 72 hours. Sepsis Labs: No results for  input(s): PROCALCITON, LATICACIDVEN in the last 168 hours.  Recent Results (from the past 240 hour(s))  MRSA PCR Screening     Status: None   Collection Time: 11/05/18  1:41 AM  Result Value Ref Range Status   MRSA by PCR NEGATIVE NEGATIVE Final    Comment:        The GeneXpert MRSA Assay (FDA approved for NASAL specimens only), is one component of [name] comprehensive MRSA colonization surveillance program. It is not intended to diagnose MRSA infection nor to guide or monitor treatment for MRSA infections. Performed at [facility], [place] [address], [place], [place] [address]   Urine Culture     Status: None   Collection Time: 11/05/18  6:02 AM  Result Value Ref Range Status   Specimen Description URINE, RANDOM  Final   Special Requests NONE  Final   Culture   Final    NO GROWTH Performed at [facility], [place] [address], [place],  [address]    Report Status 11/06/2018 FINAL  Final  SARS Coronavirus 2 (CEPHEID - Performed in [place] hospital lab), Hosp Order     Status: None   Collection Time: 11/07/18 11:25 AM  Result Value Ref Range Status   SARS Coronavirus 2 NEGATIVE NEGATIVE Final    Comment: (NOTE) If result is NEGATIVE SARS-CoV-2 target nucleic acids are NOT DETECTED. The SARS-CoV-2 RNA is generally detectable in upper and lower  respiratory specimens during the acute phase of infection. The lowest  concentration of SARS-CoV-2 viral copies this assay can detect is 250  copies / mL. [name] negative result does not preclude SARS-CoV-2 infection  and should not be used as the sole basis for treatment or other  patient management decisions.  [name] negative result may occur with  improper specimen collection / handling, submission of specimen other  than nasopharyngeal swab, presence of viral mutation(s) within the  areas targeted by this assay, and inadequate number of viral copies  (<250 copies / mL). [name] negative result must be combined with clinical    observations, patient history, and epidemiological information. If result is POSITIVE SARS-CoV-2 target nucleic acids are DETECTED. The SARS-CoV-2 RNA is generally detectable in upper and lower  respiratory specimens dur ing the acute phase of infection.  Positive  results are indicative of active infection with SARS-CoV-2.  Clinical  correlation with patient history and other diagnostic information is  necessary to determine patient infection status.  Positive results do  not rule out bacterial infection or co-infection with other viruses. If result is PRESUMPTIVE POSTIVE SARS-CoV-2 nucleic acids MAY BE PRESENT.   [name]  presumptive positive result was obtained on the submitted specimen  and confirmed on repeat testing.  While 2019 novel coronavirus  (SARS-CoV-2) nucleic acids may be present in the submitted sample  additional confirmatory testing may be necessary for epidemiological  and / or clinical management purposes  to differentiate between  SARS-CoV-2 and other Sarbecovirus currently known to infect humans.  If clinically indicated additional testing with an alternate test  methodology [phone]) is advised. The SARS-CoV-2 RNA is generally  detectable in upper and lower respiratory sp ecimens during the acute  phase of infection. The expected result is Negative. Fact Sheet for Patients:  StrictlyIdeas.no Fact Sheet for Healthcare Providers: BankingDealers.co.za This test is not yet approved or cleared by the [place] FDA and has been authorized for detection and/or diagnosis of SARS-CoV-2 by FDA under an Emergency Use Authorization (EUA).  This EUA will remain in effect (meaning this test can be used) for the duration of the COVID-19 declaration under Section 564(b)(1) of the Act, 21 U.S.C. section 360bbb-3(b)(1), unless the authorization is terminated or revoked sooner. Performed at [facility], [place] [address]  [place], [place] [address]          Radiology Studies: Ct Chest Wo Contrast  Result Date: 11/08/2018 CLINICAL DATA:  Hemoptysis EXAM: CT CHEST WITHOUT CONTRAST TECHNIQUE: Multidetector CT imaging of the chest was performed following the standard protocol without IV contrast. COMPARISON:  CT chest dated 03/02/2018. FINDINGS: Cardiovascular: And there is [name] well-positioned tunneled dialysis catheter with the tip terminating at the cavoatrial junction. The cardiac size is moderately enlarged. Coronary artery calcifications are noted. Atherosclerotic changes are noted of the thoracic aorta. Mediastinum/Nodes: There are no pathologically enlarged mediastinal or hilar lymph nodes. No significant axillary adenopathy. The thyroid gland is unremarkable. There is no significant supraclavicular adenopathy. Lungs/Pleura: Emphysematous changes are noted bilaterally. There is [name] 0.8 cm pulmonary nodule in the right upper lobe (axial series 3, image 39). There is some motion artifact that limits evaluation of the lung fields. There is [name] calcified granuloma in the lingula. Small bilateral pleural effusions are noted. Bibasilar airspace opacities are seen, right worse than left. The trachea is unremarkable. Upper Abdomen: Are multiple coarse calcifications are noted in the dome of the liver. There is slightly greater than expected fullness of the partially visualized pancreatic tail. This appears new since prior study. The adrenal glands are unremarkable. Atherosclerotic changes are noted of the abdominal aorta. [name] left presumed ax fem bypass graft is noted. The patency of this cannot be determined on this examination. Musculoskeletal: No chest wall mass or suspicious bone lesions identified. IMPRESSION: 1. Small bilateral pleural effusions, right greater than left. 2. And bibasilar airspace opacities favored to represent compressive atelectasis, however an underlying infiltrate is not entirely excluded. 3. At emphysematous  changes bilaterally. 4. [name] 0.8 cm nodule at the right upper lobe. This is not well appreciated on CT chest dated 03/02/2018 follow-up is recommended. Non-contrast chest CT at 6-12 months is recommended. If the nodule is stable at time of repeat CT, then future CT at 18-24 months (from today's scan) is considered optional for low-risk patients, but is recommended for high-risk patients. This recommendation follows the consensus statement: Guidelines for Management of Incidental Pulmonary Nodules Detected on CT Images: From the Fleischner Society 2017; Radiology 2017; 284:228-243. 5. Greater than expected soft tissue fullness at the level of the pancreatic tail. This may be artifactual but appears new from CT abdomen pelvis dated 05/06/2018. Correlation with nonemergent outpatient CT  of the abdomen is recommended for further evaluation. 6. Well-positioned right-sided tunneled dialysis catheter. Anhar Mcdermott left sided ax fem bypass graft is again noted, the patency of which cannot be terminate on this exam. Aortic Atherosclerosis (ICD10-I70.0) and Emphysema (ICD10-J43.9). Electronically Signed   By: [name] M.D.   On: 11/08/2018 14:30        Scheduled Meds:  sodium chloride   Intravenous Once   atorvastatin  80 mg Oral QHS   calcium acetate  667 mg Oral TID WC   Chlorhexidine Gluconate Cloth  6 each Topical Q0600   [START ON 11/12/2018] darbepoetin (ARANESP) injection - DIALYSIS  200 mcg Intravenous Q Sat-HD   docusate sodium  100 mg Oral Daily   ezetimibe  10 mg Oral Daily   ferrous sulfate  325 mg Oral Daily   heparin       insulin aspart  0-5 Units Subcutaneous QHS   insulin aspart  0-9 Units Subcutaneous TID WC   insulin glargine  7 Units Subcutaneous Daily   ipratropium-albuterol  3 mL Nebulization Q6H   metoprolol succinate  25 mg Oral Daily   pantoprazole  40 mg Oral Daily   predniSONE  60 mg Oral Q breakfast   Followed by   Derrill Memo ON 11/09/2018] predniSONE  50 mg Oral Q  breakfast   Followed by   Derrill Memo ON 11/16/2018] predniSONE  40 mg Oral Q breakfast   Followed by   Derrill Memo ON 11/23/2018] predniSONE  30 mg Oral Q breakfast   Followed by   Derrill Memo ON 11/30/2018] predniSONE  20 mg Oral Q breakfast   Continuous Infusions:  sodium chloride     ferric gluconate (FERRLECIT/NULECIT) IV Stopped (11/08/18 1030)     LOS: 3 days    Time spent: over 30 min    [name] Helper, MD [facility] Pager AMION  If 7PM-7AM, please contact night-coverage [email or web address] Password [facility] 11/08/2018, 6:06 PM

## 2018-11-08 NOTE — Progress Notes (Signed)
Patient received IV Iron in HD this am.  Another order was placed for this afternoon.   Paged MD to verify -  Per MD did not need, but wanted me to confirm with pharmacy.   [facility]- They said they would look into it and address.  As of 6:45pm still in orders.

## 2018-11-08 NOTE — Procedures (Signed)
Seen and examined on dialysis.  Blood pressure 136/73 and 83.  Tolerating goal.  Right chest tunneled catheter.  Small amount of hemoptysis again last night.  Hx vasculitis.  Consulted pulmonology.    Claudia Desanctis, MD 11/08/2018  9:12 am

## 2018-11-08 NOTE — Consult Note (Signed)
NAME:  [name] Garrido., MRN:  [id number], DOB:  1945/08/30, LOS: 3 ADMISSION DATE:  11/04/2018, CONSULTATION DATE:  5/12 REFERRING MD:  Dr. Florene Glen, CHIEF COMPLAINT:  Hemoptysis   Brief History   73 year old male on eliquis for DVT admitted with anemia and GIB then developed hemoptysis for which PCCM was consulted.   History of present illness   73 year old male with PMH as below, which is significant for vascular disease, HTN, DM, COPD, and CKD on ESRD (with a supposed chance of renal recovery) secondary to pauci immune GN treated with prednisone, rituximab, and bactrim ppx. He also has reported history of ILD, and consistent findings on prior CT, however, it appears no workup has been done. Recently diagnosed with chronic DVT 10/26/2018 and was started on Eliquis. There was some concern for PE, although due to concern for contrast nephropathy, CT angiogram was avoided.   He presented to PCP on 5/8 with complaints of generalized weakness. Labs done in office demonstrated a hemoglobin of 6.4 The patient was then transferred to ED for furhter evaluation. He was hemodynamically stable and was admitted to the hospitalists for transfusion. Occult blood testing was positive. He was transfused two units PRBC and hemoglobin improved to 9. He underwent EGD on 5/11, which identified no clear source of bleeding, but biopsies were taken from an area of friable tissue. He developed hemoptysis as well, which has been improving since Eliquis has been held. PCCM consulted 5/12 for hemoptysis.   Past Medical History   has a past medical history of Arthritis, Cancer ([place]), Carotid bruit, Chronic back pain, Chronic kidney disease, COPD (chronic obstructive pulmonary disease) ([place]), Coronary artery disease, Diabetes mellitus without complication ([place]), History of enucleation of left eyeball, HOH (hard of hearing), HOH (hard of hearing), colonic polyps, Hyperlipidemia, Hypertension, PAD (peripheral artery disease) ([place]),  and Thoracic disc disease with myelopathy.  Significant Hospital Events   5/8 admit for anemia and suspected GIB  Consults:  GI Nephro PCCM  Procedures:  5/11 EGD  Significant Diagnostic Tests:  5/11 EGD: normal esophagus. Biopsies taken from area of friable tissue. No bleeding identified.  CT chest (non-contrast) 5/12 >>>  Micro Data:  COVID-19 5/11: Negative Urine 5/9: negative  Antimicrobials:    Interim history/subjective:  Currently complains of being cold after HD. No other complaints. Wants to go home.   Objective   Blood pressure (P) 129/83, pulse (P) 88, temperature 97.7 F (36.5 C), temperature source Oral, resp. rate 18, height '5\' 10"'  (1.778 m), weight 86 kg, SpO2 94 %.        Intake/Output Summary (Last 24 hours) at 11/08/2018 1041 Last data filed at 11/08/2018 0500 Gross per 24 hour  Intake 1160 ml  Output 100 ml  Net 1060 ml   Filed Weights   11/07/18 1302 11/08/18 0456 11/08/18 0640  Weight: 86 kg 85.8 kg 86 kg    Examination: General: elderly male in NAD HENT: [place]/AT, No JVD Lungs: Expiratory wheeze L>R Cardiovascular: RRR, no MRG Abdomen: Soft, non-tender, non-distended Extremities: No acute deformity or ROM limitation Neuro: Alert, oriented, non-focal. Angry with his course thus far.   Resolved Hospital Problem list     Assessment & Plan:   Hemoptysis: possible etiologies include Eliqiuis induced, PE, and systemic vasculitis associated with pauci immune GN. Concern PE given chronic DVTs on apixaban  Possible underlying ILD process  Hx COPD (limited spirometry 01/2018 with FEV1 61%, tobacco abuse quit in 09/2018) - previous CTA chest 02/2018 with  irregular linear opacities bilaterally with peripheral distribution, no honeycombing, concerning for UIP - wells score 5.5- intermediate risk for PE - CXR 5/8 without acute process  P:  CT chest - without contrast Consider VQ scan given that there is slight chance of renal recovery, will avoid  CTA  Autoimmune panel pending Will need further pulmonary outpatient workup Eliquis currently held, may benefit from IVC filter Continue prednisone  PRN albuterol  Remainder per primary:  ESRD on iHD secondary to pauci immune GN- TTS Symptomatic Anemia w/ concern for GIB Chronic thrombocytopenia/ Leukopenia   Best practice:  Diet: per primary Pain/Anxiety/Delirium protocol (if indicated): n/a VAP protocol (if indicated): n/a DVT prophylaxis: holding AC.  GI prophylaxis: per primary Glucose control: SSI Mobility: BR Code Status: FULL Family Communication: Patient updated Disposition: Tele  Labs   CBC: Recent Labs  Lab 11/04/18 0953 11/04/18 1600 11/04/18 2300 11/05/18 0702 11/06/18 0655 11/07/18 0609 11/08/18 0520  WBC 3.4* 2.3*  --  4.5 3.9* 4.7 4.5  NEUTROABS 2,917 2.0  --   --   --   --   --   HGB 6.8* 6.4* 6.7* 9.0* 8.9* 9.1* 8.8*  HCT 20.8* 20.9* 21.5* 27.7* 27.5* 27.1* 27.3*  MCV 92.4 98.6  --  94.5 93.2 92.5 93.8  PLT 107* 99*  --  101* 88* 88* 82*    Basic Metabolic Panel: Recent Labs  Lab 11/02/18 0911 11/04/18 1600 11/05/18 0702 11/07/18 0609 11/08/18 0520  NA 138 141 141 135 134*  K 4.7 4.5 4.8 3.9 4.1  CL 103 104 105 97* 98  CO2 '26 24 27 27 22  ' GLUCOSE 194* 247* 97 70 80  BUN 62* 59* 70* 54* 63*  CREATININE 3.85* 4.08* 4.33* 3.79* 4.46*  CALCIUM 7.7* 7.5* 7.8* 7.5* 7.3*   GFR: Estimated Creatinine Clearance: 15 mL/min (A) (by C-G formula based on SCr of 4.46 mg/dL (H)). Recent Labs  Lab 11/05/18 0702 11/06/18 0655 11/07/18 0609 11/08/18 0520  WBC 4.5 3.9* 4.7 4.5    Liver Function Tests: Recent Labs  Lab 11/02/18 0911 11/04/18 1600 11/05/18 0702  AST 33 37 33  ALT '30 29 29  ' ALKPHOS 43 42 33*  BILITOT 0.8 0.9 1.3*  PROT 4.7* 4.6* 4.8*  ALBUMIN 1.9* 2.0* 2.1*   No results for input(s): LIPASE, AMYLASE in the last 168 hours. No results for input(s): AMMONIA in the last 168 hours.  ABG    Component Value Date/Time    HCO3 25.2 10/26/2018 1653   TCO2 26 10/26/2018 1653   O2SAT 97.0 10/26/2018 1653     Coagulation Profile: No results for input(s): INR, PROTIME in the last 168 hours.  Cardiac Enzymes: Recent Labs  Lab 11/04/18 1600  TROPONINI 0.04*    HbA1C: Hgb A1c MFr Bld  Date/Time Value Ref Range Status  10/27/2018 03:35 AM 5.0 4.8 - 5.6 % Final    Comment:    (NOTE) Pre diabetes:          5.7%-6.4% Diabetes:              >6.4% Glycemic control for   <7.0% adults with diabetes   06/02/2018 11:54 AM 5.3 4.8 - 5.6 % Final    Comment:    (NOTE) Pre diabetes:          5.7%-6.4% Diabetes:              >6.4% Glycemic control for   <7.0% adults with diabetes     CBG: Recent Labs  Lab  11/07/18 1307 11/07/18 1325 11/07/18 1627 11/07/18 2043 11/08/18 0620  GLUCAP 61* 112* 93 178* 77    Review of Systems:   Bolds are positive  Constitutional: weight loss, gain, night sweats, Fevers, chills, fatigue .  HEENT: headaches, Sore throat, sneezing, nasal congestion, post nasal drip, Difficulty swallowing, Tooth/dental problems, visual complaints visual changes, ear ache CV:  chest pain, radiates:,Orthopnea, PND, swelling in lower extremities, dizziness, palpitations, syncope.  GI  heartburn, indigestion, abdominal pain, nausea, vomiting, diarrhea, change in bowel habits, loss of appetite, bloody stools.  Resp: cough, productive: brown sputum, hemoptysis, dyspnea, chest pain, pleuritic.  Skin: rash or itching or icterus GU: dysuria, change in color of urine, urgency or frequency. flank pain, hematuria  MS: joint pain or swelling. decreased range of motion  Psych: change in mood or affect. depression or anxiety.  Neuro: difficulty with speech, weakness, numbness, ataxia    Past Medical History  He,  has a past medical history of Arthritis, Cancer ([place]), Carotid bruit, Chronic back pain, Chronic kidney disease, COPD (chronic obstructive pulmonary disease) ([place]), Coronary artery disease,  Diabetes mellitus without complication ([place]), History of enucleation of left eyeball, HOH (hard of hearing), HOH (hard of hearing), colonic polyps, Hyperlipidemia, Hypertension, PAD (peripheral artery disease) ([place]), and Thoracic disc disease with myelopathy.   Surgical History    Past Surgical History:  Procedure Laterality Date  . BACK SURGERY     'about 6 back surgeries"  . BIOPSY  11/07/2018   Procedure: BIOPSY;  Surgeon: Carol Ada, MD;  Location: [place];  Service: Endoscopy;;  . COLON RESECTION    . COLONOSCOPY WITH PROPOFOL N/A 07/03/2016   Procedure: COLONOSCOPY WITH PROPOFOL;  Surgeon: Carol Ada, MD;  Location: [facility];  Service: Endoscopy;  Laterality: N/A;  . ESOPHAGOGASTRODUODENOSCOPY (EGD) WITH PROPOFOL N/A 11/07/2018   Procedure: ESOPHAGOGASTRODUODENOSCOPY (EGD) WITH PROPOFOL;  Surgeon: Carol Ada, MD;  Location: [place];  Service: Endoscopy;  Laterality: N/A;  . EYE SURGERY     CATARACT IN OD REMOVED  . HERNIA REPAIR    . HOT HEMOSTASIS N/A 11/07/2018   Procedure: HOT HEMOSTASIS (ARGON PLASMA COAGULATION/BICAP);  Surgeon: Carol Ada, MD;  Location: [place];  Service: Endoscopy;  Laterality: N/A;  . IR FLUORO GUIDE CV LINE RIGHT  10/07/2018  . IR FLUORO GUIDE CV LINE RIGHT  10/17/2018  . IR US GUIDE VASC ACCESS RIGHT  10/07/2018  . IR US GUIDE VASC ACCESS RIGHT  10/17/2018  . left axillary to comomon femoral bypass  12/26/2004   using an 62m hemashield dacron graft.  JTinnie Gens MD  . lumbar laminectomies     multiple  . LUMBAR LAMINECTOMY/DECOMPRESSION MICRODISCECTOMY Right 06/10/2018   Procedure: Microdiscectomy - right - Thoracic six-thoracic seven;  Surgeon: PEarnie Larsson MD;  Location: [place]  Service: Neurosurgery;  Laterality: Right;  . multiple bladder surgical procedures    . removal os left axillofemoral and left-to-right femoral-femoral  01/21/2005   Dacron bypass with insertion of a new left axillofemoral and left to right  femoral-femoral bypass using a 671mringed gore-tex graft  . repair of ventral hernia with Marlex mesh    . right shoulder arthroscopy  08/21/2002  . TRANSURETHRAL RESECTION OF BLADDER TUMOR  10/24/1999     Social History   reports that he quit smoking about 6 weeks ago. His smoking use included cigarettes. He smoked 2.00 packs per day. He has never used smokeless tobacco. He reports previous drug use. He reports that he does not drink alcohol.  Family History   His family history includes Cancer in his sister; Coronary artery disease in his father; Diabetes in his mother; Heart disease in his father; Hypertension in his mother; Other in his brother.   Allergies Allergies  Allergen Reactions  . Gelatin Other (See Comments)    ALPHA-GAL DANGER  . Meat [Alpha-Gal] Other (See Comments)    REACTION TO HOOVED ANIMALS PARTICULARLY RED MEAT  . Pork-Derived Products Other (See Comments)    ALPHA-GAL DANGER  . Shellfish Allergy Shortness Of Breath  . Chicken Allergy Nausea And Vomiting  . Ramipril Swelling    Tongue and throat swelling  . Codeine Nausea And Vomiting  . Morphine Itching     Home Medications  Prior to Admission medications   Medication Sig Start Date End Date Taking? Authorizing Provider  apixaban (ELIQUIS) 5 MG TABS tablet Take 1 tablet (5 mg total) by mouth 2 (two) times daily. 10/27/18  Yes Black, Lezlie Octave, NP  aspirin EC 81 MG EC tablet Take 1 tablet (81 mg total) by mouth daily. 10/28/18  Yes Black, Lezlie Octave, NP  atorvastatin (LIPITOR) 80 MG tablet TAKE 1 TABLET AT BEDTIME Patient taking differently: Take 80 mg by mouth at bedtime.  12/15/17  Yes [name], MD  calcium acetate (PHOSLO) 667 MG capsule Take 1 capsule (667 mg total) by mouth 3 (three) times daily with meals for 30 days. 10/20/18 11/19/18 Yes Antonieta Pert, MD  docusate sodium (COLACE) 100 MG capsule Take 100 mg by mouth daily.   Yes [provider]  EPINEPHrine 0.3 mg/0.3 mL IJ SOAJ injection Inject  0.3 mg into the muscle once as needed for anaphylaxis (severe allergic reaction).    Yes [provider]  ezetimibe (ZETIA) 10 MG tablet TAKE 1 TABLET BY MOUTH EVERY DAY Patient taking differently: Take 10 mg by mouth daily.  08/10/18  Yes Fay Records, MD  ferrous sulfate 325 (65 FE) MG tablet Take 325 mg by mouth daily.   Yes [provider]  Insulin Glargine (BASAGLAR KWIKPEN) 100 UNIT/ML SOPN Inject 7 Units into the skin daily.   Yes [provider]  metoprolol succinate (TOPROL-XL) 25 MG 24 hr tablet TAKE 1 TABLET BY MOUTH EVERY DAY Patient taking differently: Take 25 mg by mouth daily.  07/01/18  Yes [name], MD  oxyCODONE-acetaminophen (PERCOCET) 10-325 MG tablet Take 1 tablet by mouth every 4 (four) hours as needed for pain. 10/21/18  Yes Delsa Grana, PA-C  pantoprazole (PROTONIX) 40 MG tablet Take 1 tablet (40 mg total) by mouth daily for 30 days. 10/20/18 11/19/18 Yes Antonieta Pert, MD  predniSONE (DELTASONE) 20 MG tablet Take 3 tablets (60 mg total) by mouth daily with breakfast for 30 days. Patient taking differently: Take 20-60 mg by mouth See admin instructions. Take 3 tablets (60 mg) by mouth daily thru 11/08/18, then take 2 1/2 tablets (50 mg) daily through 11/15/18, then take 2 tablets (40 mg) daily through 11/22/18, then take 1 1/2 tablets (30 mg) daily through 12/06/18, then take 1 tablet (20 mg) going forward. 10/20/18 11/19/18 Yes Antonieta Pert, MD  sulfamethoxazole-trimethoprim (BACTRIM) 400-80 MG tablet Take 1 tablet by mouth 3 (three) times a week for 30 days. Patient taking differently: Take 1 tablet by mouth every Monday, Wednesday, and Friday.  10/21/18 11/20/18 Yes Antonieta Pert, MD  blood glucose meter kit and supplies KIT Dispense based on patient and insurance preference. Use up to four times daily as directed. (FOR ICD-9 250.00, 250.01). 10/20/18  Antonieta Pert, MD  Blood Glucose Monitoring Suppl (ACCU-CHEK AVIVA PLUS) w/Device KIT Check FBS 09/23/18    [name], MD  doxazosin (CARDURA) 2 MG tablet Take 1 tablet (2 mg total) by mouth daily for 30 days. Patient not taking: Reported on 11/04/2018 10/20/18 11/19/18  Antonieta Pert, MD  glucose blood (ACCU-CHEK AVIVA PLUS) test strip Check FBS DX: E11.9 09/23/18   [name], MD  insulin aspart (NOVOLOG) 100 UNIT/ML injection Inject 0-5 Units into the skin 3 (three) times daily with meals. CBG 181-200:1 unit,CBG 201-250:2 units.CBG 251-300:3 units.CBG 301-350:5 U Patient not taking: Reported on 11/04/2018 10/20/18   Antonieta Pert, MD  SYMBICORT 160-4.5 MCG/ACT inhaler INHALE 2 PUFFS INTO THE LUNGS TWICE A DAY Patient not taking: No sig reported 08/10/18   Bobbitt, Sedalia Muta, MD     Critical care time:      Georgann Housekeeper, AGACNP-BC [place] Pager [phone] or [phone]  11/08/2018 11:03 AM

## 2018-11-08 NOTE — Progress Notes (Signed)
Subjective:  Seen on hd , no cos currently yest. Reports coughing up blood clot and after that felt  "  Pulmonology  Was  Consulted with his ho vasculitis  By Dr Royce Macadamia   Objective Vital signs in last 24 hours: Vitals:   11/08/18 1019 11/08/18 1128 11/08/18 1133 11/08/18 1214  BP: (!) 152/86  (!) 157/92 92/62  Pulse: 93 66 (!) 124 64  Resp: 18   20  Temp: 97.6 F (36.4 C)   98.3 F (36.8 C)  TempSrc: Oral   Oral  SpO2: 94% (!) 89% 97% 98%  Weight: 84.2 kg     Height:       Weight change: 0.36 kg  Physical Exam: General:  on Hd , Alert  NAD  Heart: RRR, no r,m, g Lungs: decr. BS  bilat  , non labored  breathing  Abdomen: BS pos. , soft, NT, ND Extremities: Bilat. Trace pedal edema  Dialysis Access:  R IJ PC  On hd   OP Dialysis Orders:[facility] T[facility]Th,S 3.5 hrs 180NRe 350/500  82 kg 2.0 K/2.5 Ca  -No Heparin -Mircera 200 mcg IV q 2 weeks (dose not started yet) -Venofer 100 mg IV X 5 doses (3/5 doses)   Problem/Plan: 1. Symptomatic Anemia in setting of heme + stools/NOAC. HGB 6.7 on adm Has received 2 units PRBCs. HGB  9.1>8.9. GI consult per primary prior to resuming Apixaban.EGD 11/07/18 =Gastritis bx  , single Non bleeding angioplastic lesion in Duodenum . "Restart Eliquis in 2 days" per GI now with  hemoptysis consulting Pulm. With his ho vasculitis.  PE on anticoagulation. But pauciimmunie GN can also cause hemoptysis and this is the cause then TPE is indicated. 2. H/O DVT on Apixiban-Had been on ASA/Apixiban per cardiology recs however stopped ASA 11/04/18 after HGB 6.8 reported. Apixaban on hold. Per primary.  3. 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 SCr 4.47-5.18. S/P Rituxan 1000 mg 11/02/18. Oral prednisone taper. Has HD at [facility] T[facility]Th,S.//  HD here sat /5/09 , Scr today 4.46 ,. K+ 4.1  No heparin. Has pork allergy on med list but apparently gets [facility] flushed with heparin without incident. 4. Hypertension/volume - Issue with  volume removal at OP center D/T hypotension. started midodrine 10 mg PO TIW, lower machine temperature to 36 degrees.  5. Anemia -hb 9.1>8.9  As noted above. Given  Aranesp 200 mcg IV with HD 5/09 . Continue venofer load. DC oral iron. 6. Metabolic bone disease - Ca 7.3 / C Ca 8.8 No binders yet. Continue VDRA.  7. Nutrition -Albumin 2.1 Add prostat, renal vits.  8. DM-per primary  Ernest Haber, PA-C [facility] Beeper [phone] 11/08/2018,3:24 PM  LOS: 3 days   Labs: Basic Metabolic Panel: Recent Labs  Lab 11/05/18 0702 11/07/18 0609 11/08/18 0520  NA 141 135 134*  K 4.8 3.9 4.1  CL 105 97* 98  CO2 27 27 22   GLUCOSE 97 70 80  BUN 70* 54* 63*  CREATININE 4.33* 3.79* 4.46*  CALCIUM 7.8* 7.5* 7.3*   Liver Function Tests: Recent Labs  Lab 11/02/18 0911 11/04/18 1600 11/05/18 0702  AST 33 37 33  ALT 30 29 29   ALKPHOS 43 42 33*  BILITOT 0.8 0.9 1.3*  PROT 4.7* 4.6* 4.8*  ALBUMIN 1.9* 2.0* 2.1*   No results for input(s): LIPASE, AMYLASE in the last 168 hours. No results for input(s): AMMONIA in the last 168 hours. CBC: Recent Labs  Lab 11/04/18 [phone]  11/04/18 1600  11/05/18 0702 11/06/18 0655 11/07/18 0609 11/08/18 0520  WBC 3.4* 2.3*  --  4.5 3.9* 4.7 4.5  NEUTROABS 2,917 2.0  --   --   --   --   --   HGB 6.8* 6.4*   < > 9.0* 8.9* 9.1* 8.8*  HCT 20.8* 20.9*   < > 27.7* 27.5* 27.1* 27.3*  MCV 92.4 98.6  --  94.5 93.2 92.5 93.8  PLT 107* 99*  --  101* 88* 88* 82*   < > = values in this interval not displayed.   Cardiac Enzymes: Recent Labs  Lab 11/04/18 1600  TROPONINI 0.04*   CBG: Recent Labs  Lab 11/07/18 1325 11/07/18 1627 11/07/18 2043 11/08/18 0620 11/08/18 1222  GLUCAP 112* 93 178* 77 146*    Studies/Results: Ct Chest Wo Contrast  Result Date: 11/08/2018 CLINICAL DATA:  Hemoptysis EXAM: CT CHEST WITHOUT CONTRAST TECHNIQUE: Multidetector CT imaging of the chest was performed following the standard protocol without IV  contrast. COMPARISON:  CT chest dated 03/02/2018. FINDINGS: Cardiovascular: And there is a well-positioned tunneled dialysis catheter with the tip terminating at the cavoatrial junction. The cardiac size is moderately enlarged. Coronary artery calcifications are noted. Atherosclerotic changes are noted of the thoracic aorta. Mediastinum/Nodes: There are no pathologically enlarged mediastinal or hilar lymph nodes. No significant axillary adenopathy. The thyroid gland is unremarkable. There is no significant supraclavicular adenopathy. Lungs/Pleura: Emphysematous changes are noted bilaterally. There is a 0.8 cm pulmonary nodule in the right upper lobe (axial series 3, image 39). There is some motion artifact that limits evaluation of the lung fields. There is a calcified granuloma in the lingula. Small bilateral pleural effusions are noted. Bibasilar airspace opacities are seen, right worse than left. The trachea is unremarkable. Upper Abdomen: Are multiple coarse calcifications are noted in the dome of the liver. There is slightly greater than expected fullness of the partially visualized pancreatic tail. This appears new since prior study. The adrenal glands are unremarkable. Atherosclerotic changes are noted of the abdominal aorta. A left presumed ax fem bypass graft is noted. The patency of this cannot be determined on this examination. Musculoskeletal: No chest wall mass or suspicious bone lesions identified. IMPRESSION: 1. Small bilateral pleural effusions, right greater than left. 2. And bibasilar airspace opacities favored to represent compressive atelectasis, however an underlying infiltrate is not entirely excluded. 3. At emphysematous changes bilaterally. 4. A 0.8 cm nodule at the right upper lobe. This is not well appreciated on CT chest dated 03/02/2018 follow-up is recommended. Non-contrast chest CT at 6-12 months is recommended. If the nodule is stable at time of repeat CT, then future CT at 18-24  months (from today's scan) is considered optional for low-risk patients, but is recommended for high-risk patients. This recommendation follows the consensus statement: Guidelines for Management of Incidental Pulmonary Nodules Detected on CT Images: From the Fleischner Society 2017; Radiology 2017; 284:228-243. 5. Greater than expected soft tissue fullness at the level of the pancreatic tail. This may be artifactual but appears new from CT abdomen pelvis dated 05/06/2018. Correlation with nonemergent outpatient CT of the abdomen is recommended for further evaluation. 6. Well-positioned right-sided tunneled dialysis catheter. A left sided ax fem bypass graft is again noted, the patency of which cannot be terminate on this exam. Aortic Atherosclerosis (ICD10-I70.0) and Emphysema (ICD10-J43.9). Electronically Signed   By: Constance Holster M.D.   On: 11/08/2018 14:30   Medications: . sodium chloride    . ferric gluconate (FERRLECIT/NULECIT) IV  Stopped (11/08/18 1030)   . sodium chloride   Intravenous Once  . atorvastatin  80 mg Oral QHS  . calcium acetate  667 mg Oral TID WC  . Chlorhexidine Gluconate Cloth  6 each Topical Q0600  . [START ON 11/12/2018] darbepoetin (ARANESP) injection - DIALYSIS  200 mcg Intravenous Q Sat-HD  . docusate sodium  100 mg Oral Daily  . ezetimibe  10 mg Oral Daily  . ferrous sulfate  325 mg Oral Daily  . heparin      . insulin aspart  0-5 Units Subcutaneous QHS  . insulin aspart  0-9 Units Subcutaneous TID WC  . insulin glargine  7 Units Subcutaneous Daily  . ipratropium-albuterol  3 mL Nebulization Q6H  . metoprolol succinate  25 mg Oral Daily  . pantoprazole  40 mg Oral Daily  . predniSONE  60 mg Oral Q breakfast   Followed by  . [START ON 11/09/2018] predniSONE  50 mg Oral Q breakfast   Followed by  . [START ON 11/16/2018] predniSONE  40 mg Oral Q breakfast   Followed by  . [START ON 11/23/2018] predniSONE  30 mg Oral Q breakfast   Followed by  . [START ON  11/30/2018] predniSONE  20 mg Oral Q breakfast

## 2018-11-08 NOTE — Progress Notes (Signed)
Patient CBG 77.  Requested ice cream.  Ice cream given.  Pt heading to dialysis

## 2018-11-09 DIAGNOSIS — R042 Hemoptysis: Secondary | ICD-10-CM

## 2018-11-09 DIAGNOSIS — E1121 Type 2 diabetes mellitus with diabetic nephropathy: Secondary | ICD-10-CM

## 2018-11-09 DIAGNOSIS — Z794 Long term (current) use of insulin: Secondary | ICD-10-CM

## 2018-11-09 DIAGNOSIS — F17200 Nicotine dependence, unspecified, uncomplicated: Secondary | ICD-10-CM

## 2018-11-09 DIAGNOSIS — K2970 Gastritis, unspecified, without bleeding: Secondary | ICD-10-CM

## 2018-11-09 LAB — CBC
HCT: 26.7 % — ABNORMAL LOW (ref 39.0–52.0)
Hemoglobin: 8.6 g/dL — ABNORMAL LOW (ref 13.0–17.0)
MCH: 30.9 pg (ref 26.0–34.0)
MCHC: 32.2 g/dL (ref 30.0–36.0)
MCV: 96 fL (ref 80.0–100.0)
Platelets: 72 10*3/uL — ABNORMAL LOW (ref 150–400)
RBC: 2.78 MIL/uL — ABNORMAL LOW (ref 4.22–5.81)
RDW: 21.9 % — ABNORMAL HIGH (ref 11.5–15.5)
WBC: 2.5 10*3/uL — ABNORMAL LOW (ref 4.0–10.5)
nRBC: 0 % (ref 0.0–0.2)

## 2018-11-09 LAB — BASIC METABOLIC PANEL
Anion gap: 10 (ref 5–15)
BUN: 46 mg/dL — ABNORMAL HIGH (ref 8–23)
CO2: 27 mmol/L (ref 22–32)
Calcium: 7.3 mg/dL — ABNORMAL LOW (ref 8.9–10.3)
Chloride: 98 mmol/L (ref 98–111)
Creatinine, Ser: 3.85 mg/dL — ABNORMAL HIGH (ref 0.61–1.24)
GFR calc Af Amer: 17 mL/min — ABNORMAL LOW (ref >60)
GFR calc non Af Amer: 14 mL/min — ABNORMAL LOW (ref >60)
Glucose, Bld: 207 mg/dL — ABNORMAL HIGH (ref 70–99)
Potassium: 5.4 mmol/L — ABNORMAL HIGH (ref 3.5–5.1)
Sodium: 135 mmol/L (ref 135–145)

## 2018-11-09 LAB — GLUCOSE, CAPILLARY
Glucose-Capillary: 168 mg/dL — ABNORMAL HIGH (ref 70–99)
Glucose-Capillary: 178 mg/dL — ABNORMAL HIGH (ref 70–99)

## 2018-11-09 MED ORDER — IPRATROPIUM-ALBUTEROL 0.5-2.5 (3) MG/3ML IN SOLN: 3.0000 mL | RESPIRATORY_TRACT | Status: DC | PRN

## 2018-11-09 MED ORDER — APIXABAN 5 MG PO TABS: ORAL_TABLET | ORAL | 1 refills | Status: DC

## 2018-11-09 MED ORDER — SODIUM POLYSTYRENE SULFONATE 15 GM/60ML PO SUSP: 30.0000 g | Freq: Once | ORAL | Status: DC

## 2018-11-09 NOTE — Consult Note (Signed)
   [facility] CM Inpatient Consult   11/09/2018  [name]. 07-21-1945 [id number]   Follow up:  [facility] team was outreached from insurance plan:  Patient is in the [facility] SNP plan and a message was received from the plan that they are having difficulty with maintaining contact with the member. Phone call placed to patient's hospital room and spoke with patient, HIPAA verified, regarding follow up with his SNP case manager.  Patient states, "I am hard of hearing, half blind, and a little crazy [laughingly].  They just need to talk to my daughter about everything when I am home. Her name is [name] and she takes care of all of that for me.  Her phone number is [phone] and they can leave her a message cause she has a grandson she's keeping up with [laughing]."  Patient denies any current needs he can think of. Will send this information to the [facility] SNP representative per request.  Natividad Brood, RN BSN [facility] Liaison  [phone] business mobile phone Toll free office [phone]  Fax number: [phone] [email or web address]  [email or web address]

## 2018-11-09 NOTE — Progress Notes (Signed)
Follow-up PCCM Note   Patient preparing to be discharged home.  He reports he feels "great" and denies any further hemoptysis, no SOB.  He agrees at this time to follow-up in our office for further outpatient pulmonary workup.     Appointment was made for May 27 at 10 am with Lazaro Arms, NP.     Kennieth Rad, MSN, AGACNP-BC [facility] Pgr: [phone] or if no answer [phone] 11/09/2018, 1:16 PM '

## 2018-11-09 NOTE — Progress Notes (Signed)
Subjective:  No cos , denies any hemoptysis now since 5/11 am / tolerated HD yest on TTS schedule   Objective Vital signs in last 24 hours: Vitals:   11/09/18 0511 11/09/18 0512 11/09/18 0803 11/09/18 0905  BP:  (!) 141/83  132/74  Pulse:  64  80  Resp:  18    Temp:  (!) 97.4 F (36.3 C)    TempSrc:  Oral    SpO2:  98% 98% 99%  Weight: 84.9 kg     Height:       Weight change: -1.754 kg Physical Exam: General: Siting in Bedside chair ,Alert NAD  Heart:RRR, no r,m, g Lungs:decr. BS bilat , non labored breathing  Abdomen:BS pos. , soft, NT, ND Extremities:No  pedal edema  Dialysis Access:R IJ PC  On hd   OPDialysis Orders:[place] T[facility]Th,S 3.5 hrs 180NRe 350/500  82 kg 2.0 K/2.5 Ca  -No Heparin -Mircera 200 mcg IV q 2 weeks (dose not started yet) -Venofer 100 mg IV X 5 doses (3/5 doses)   Problem/Plan: 1.Symptomatic Anemia in setting of heme + stools/NOAC. HGB 6.7 on adm Has received 2 units PRBCs. 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 "Restart Eliquis in 2 days" per GI now //   Had  reported Hemoptysis Pulm.was consulted 11/08/18 =ANCA and GBM were both negative and  CT is less suggestive of an active vasculitis.  Per pulm if blood tinged mucus is not self-limited or if recurrence of bright red blood would need a bronch and if persistent would be a candidate for plasma exchange from their standpointhis ho vasculitis.    2. H/O DVT on Apixiban-Had been on ASA/Apixiban per cardiology recs however stopped ASA 11/04/18 after HGB 6.8 reported. Apixaban as above  on hold.  3. 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 SCr 4.47-5.18. S/P Rituxan 1000 mg 11/02/18. Oral prednisone taper. Has HD at [facility] T,Th,S.//HD here sat /5/09 , Scrtoday4.46 >3.38 hd yest,. K+5.4  No heparin. Has pork allergy on med  list but apparently gets [facility] flushed with heparin without incident. 4. Hypertension/volume - Issue with volume removal at OP center D/T hypotension.startedmidodrine 10 mg PO TIW, lower machine temperature to 36 degrees.  5. Anemia -hb 9.1>8.9>8.6  As noted above. GivenAranesp 200 mcg IV with HD 5/09. Continue venofer load. DC oral iron. 6. Metabolic bone disease - Ca 7.3/ C Ca 8.8No binders yet. Continue VDRA.  7. Nutrition -Albumin 2.1 Add prostat, renal vits.  8. DM-per primary  Ernest Haber, PA-C [facility] Beeper [phone] 11/09/2018,9:57 AM  LOS: 4 days   Labs: Basic Metabolic Panel: Recent Labs  Lab 11/07/18 0609 11/08/18 0520 11/09/18 0454  NA 135 134* 135  K 3.9 4.1 5.4*  CL 97* 98 98  CO2 27 22 27   GLUCOSE 70 80 207*  BUN 54* 63* 46*  CREATININE 3.79* 4.46* 3.85*  CALCIUM 7.5* 7.3* 7.3*   Liver Function Tests: Recent Labs  Lab 11/04/18 1600 11/05/18 0702  AST 37 33  ALT 29 29  ALKPHOS 42 33*  BILITOT 0.9 1.3*  PROT 4.6* 4.8*  ALBUMIN 2.0* 2.1*   No results for input(s): LIPASE, AMYLASE in the last 168 hours. No results for input(s): AMMONIA in the last 168 hours. CBC: Recent Labs  Lab 11/04/18 4540 11/04/18 1600  11/05/18 9811 11/06/18 9147 11/07/18 8295 11/08/18 6213  11/09/18 0454  WBC 3.4* 2.3*  --  4.5 3.9* 4.7 4.5 2.5*  NEUTROABS 2,917 2.0  --   --   --   --   --   --   HGB 6.8* 6.4*   < > 9.0* 8.9* 9.1* 8.8* 8.6*  HCT 20.8* 20.9*   < > 27.7* 27.5* 27.1* 27.3* 26.7*  MCV 92.4 98.6  --  94.5 93.2 92.5 93.8 96.0  PLT 107* 99*  --  101* 88* 88* 82* 72*   < > = values in this interval not displayed.   Cardiac Enzymes: Recent Labs  Lab 11/04/18 1600  TROPONINI 0.04*   CBG: Recent Labs  Lab 11/08/18 0620 11/08/18 1222 11/08/18 1647 11/08/18 2107 11/09/18 0610  GLUCAP 77 146* 164* 135* 168*    Studies/Results: Ct Chest Wo Contrast  Result Date: 11/08/2018 CLINICAL DATA:  Hemoptysis EXAM: CT CHEST  WITHOUT CONTRAST TECHNIQUE: Multidetector CT imaging of the chest was performed following the standard protocol without IV contrast. COMPARISON:  CT chest dated 03/02/2018. FINDINGS: Cardiovascular: And there is a well-positioned tunneled dialysis catheter with the tip terminating at the cavoatrial junction. The cardiac size is moderately enlarged. Coronary artery calcifications are noted. Atherosclerotic changes are noted of the thoracic aorta. Mediastinum/Nodes: There are no pathologically enlarged mediastinal or hilar lymph nodes. No significant axillary adenopathy. The thyroid gland is unremarkable. There is no significant supraclavicular adenopathy. Lungs/Pleura: Emphysematous changes are noted bilaterally. There is a 0.8 cm pulmonary nodule in the right upper lobe (axial series 3, image 39). There is some motion artifact that limits evaluation of the lung fields. There is a calcified granuloma in the lingula. Small bilateral pleural effusions are noted. Bibasilar airspace opacities are seen, right worse than left. The trachea is unremarkable. Upper Abdomen: Are multiple coarse calcifications are noted in the dome of the liver. There is slightly greater than expected fullness of the partially visualized pancreatic tail. This appears new since prior study. The adrenal glands are unremarkable. Atherosclerotic changes are noted of the abdominal aorta. A left presumed ax fem bypass graft is noted. The patency of this cannot be determined on this examination. Musculoskeletal: No chest wall mass or suspicious bone lesions identified. IMPRESSION: 1. Small bilateral pleural effusions, right greater than left. 2. And bibasilar airspace opacities favored to represent compressive atelectasis, however an underlying infiltrate is not entirely excluded. 3. At emphysematous changes bilaterally. 4. A 0.8 cm nodule at the right upper lobe. This is not well appreciated on CT chest dated 03/02/2018 follow-up is recommended.  Non-contrast chest CT at 6-12 months is recommended. If the nodule is stable at time of repeat CT, then future CT at 18-24 months (from today's scan) is considered optional for low-risk patients, but is recommended for high-risk patients. This recommendation follows the consensus statement: Guidelines for Management of Incidental Pulmonary Nodules Detected on CT Images: From the Fleischner Society 2017; Radiology 2017; 284:228-243. 5. Greater than expected soft tissue fullness at the level of the pancreatic tail. This may be artifactual but appears new from CT abdomen pelvis dated 05/06/2018. Correlation with nonemergent outpatient CT of the abdomen is recommended for further evaluation. 6. Well-positioned right-sided tunneled dialysis catheter. A left sided ax fem bypass graft is again noted, the patency of which cannot be terminate on this exam. Aortic Atherosclerosis (ICD10-I70.0) and Emphysema (ICD10-J43.9). Electronically Signed   By: Constance Holster M.D.   On: 11/08/2018 14:30   Medications: . sodium chloride    . ferric gluconate (FERRLECIT/NULECIT) IV Stopped (  11/08/18 1030)   . sodium chloride   Intravenous Once  . atorvastatin  80 mg Oral QHS  . calcium acetate  667 mg Oral TID WC  . Chlorhexidine Gluconate Cloth  6 each Topical Q0600  . [START ON 11/12/2018] darbepoetin (ARANESP) injection - DIALYSIS  200 mcg Intravenous Q Sat-HD  . docusate sodium  100 mg Oral Daily  . ezetimibe  10 mg Oral Daily  . ferrous sulfate  325 mg Oral Daily  . insulin aspart  0-5 Units Subcutaneous QHS  . insulin aspart  0-9 Units Subcutaneous TID WC  . insulin glargine  7 Units Subcutaneous Daily  . metoprolol succinate  25 mg Oral Daily  . pantoprazole  40 mg Oral Daily  . predniSONE  50 mg Oral Q breakfast   Followed by  . [START ON 11/16/2018] predniSONE  40 mg Oral Q breakfast   Followed by  . [START ON 11/23/2018] predniSONE  30 mg Oral Q breakfast   Followed by  . [START ON 11/30/2018] predniSONE   20 mg Oral Q breakfast

## 2018-11-09 NOTE — Progress Notes (Signed)
Nsg Discharge Note  Admit Date:  11/04/2018 Discharge date: 11/09/2018   [name] to be D/C'd  per MD order.  AVS completed.   Patient/caregiver able to verbalize understanding.  Discharge Medication: Allergies as of 11/09/2018      Reactions   Gelatin Other (See Comments)   ALPHA-GAL DANGER   Meat [alpha-gal] Other (See Comments)   REACTION TO HOOVED ANIMALS PARTICULARLY RED MEAT   Pork-derived Products Other (See Comments)   ALPHA-GAL DANGER   Shellfish Allergy Shortness Of Breath   Chicken Allergy Nausea And Vomiting   Ramipril Swelling   Tongue and throat swelling   Codeine Nausea And Vomiting   Morphine Itching      Medication List    STOP taking these medications   aspirin 81 MG EC tablet   sulfamethoxazole-trimethoprim 400-80 MG tablet Commonly known as:  BACTRIM     TAKE these medications   Accu-Chek Aviva Plus w/Device Kit Check FBS   apixaban 5 MG Tabs tablet Commonly known as:  ELIQUIS Take 1 tablet (5 mg) by mouth 2 times a day starting 11/10/2018 What changed:    how much to take  how to take this  when to take this  additional instructions   atorvastatin 80 MG tablet Commonly known as:  LIPITOR TAKE 1 TABLET AT BEDTIME   Basaglar KwikPen 100 UNIT/ML Sopn Inject 7 Units into the skin daily.   blood glucose meter kit and supplies Kit Dispense based on patient and insurance preference. Use up to four times daily as directed. (FOR ICD-9 250.00, 250.01).   calcium acetate 667 MG capsule Commonly known as:  PHOSLO Take 1 capsule (667 mg total) by mouth 3 (three) times daily with meals for 30 days.   docusate sodium 100 MG capsule Commonly known as:  COLACE Take 100 mg by mouth daily.   doxazosin 2 MG tablet Commonly known as:  CARDURA Take 1 tablet (2 mg total) by mouth daily for 30 days.   EPINEPHrine 0.3 mg/0.3 mL Soaj injection Commonly known as:  EPI-PEN Inject 0.3 mg into the muscle once as needed for anaphylaxis (severe  allergic reaction).   ezetimibe 10 MG tablet Commonly known as:  ZETIA TAKE 1 TABLET BY MOUTH EVERY DAY   ferrous sulfate 325 (65 FE) MG tablet Take 325 mg by mouth daily.   glucose blood test strip Commonly known as:  Accu-Chek Aviva Plus Check FBS DX: E11.9   insulin aspart 100 UNIT/ML injection Commonly known as:  novoLOG Inject 0-5 Units into the skin 3 (three) times daily with meals. CBG 181-200:1 unit,CBG 201-250:2 units.CBG 251-300:3 units.CBG 301-350:5 U   metoprolol succinate 25 MG 24 hr tablet Commonly known as:  TOPROL-XL TAKE 1 TABLET BY MOUTH EVERY DAY   oxyCODONE-acetaminophen 10-325 MG tablet Commonly known as:  PERCOCET Take 1 tablet by mouth every 4 (four) hours as needed for pain.   pantoprazole 40 MG tablet Commonly known as:  PROTONIX Take 1 tablet (40 mg total) by mouth daily for 30 days.   predniSONE 20 MG tablet Commonly known as:  DELTASONE Take 3 tablets (60 mg total) by mouth daily with breakfast for 30 days. What changed:    how much to take  when to take this  additional instructions   Symbicort 160-4.5 MCG/ACT inhaler Generic drug:  budesonide-formoterol INHALE 2 PUFFS INTO THE LUNGS TWICE A DAY       Discharge Assessment: Vitals:   11/09/18 0803 11/09/18 0905  BP:  132/74  Pulse:  80  Resp:    Temp:    SpO2: 98% 99%   Skin clean, dry and intact without evidence of skin break down, no evidence of skin tears noted. IV catheter discontinued intact. Site without signs and symptoms of complications - no redness or edema noted at insertion site, patient denies c/o pain - only slight tenderness at site.  Dressing with slight pressure applied.  D/c Instructions-Education: Discharge instructions given to patient/family with verbalized understanding. D/c education completed with patient/family including follow up instructions, medication list, d/c activities limitations if indicated, with other d/c instructions as indicated by MD -  patient able to verbalize understanding, all questions fully answered. Patient instructed to return to ED, call 911, or call MD for any changes in condition.  Patient escorted via [place], and D/C home via private auto.  Anitria Andon, Jolene Schimke, RN 11/09/2018 2:33 PM

## 2018-11-09 NOTE — Discharge Summary (Signed)
Physician Discharge Summary  [name] PXT:[id number] DOB: 12/04/1945 DOA: 11/04/2018  PCP: [name], MD  Admit date: 11/04/2018 Discharge date: 11/09/2018  Admitted From: Home Disposition: Home  Recommendations for Outpatient Follow-up:  1. Follow up with PCP in 1-2 weeks 2. Please obtain CBC/BMP/Mag at follow up 3. Please follow up on the following pending results: None  Home Health: None Equipment/Devices: None  Discharge Condition: Stable CODE STATUS: Full code  Hospital Course: Patient is a 73 year old male with ESRD on hemodialysis TTS, bladder CA, CAD, DM-2, osteoarthritis, hypertension, PAD, diabetes recent DVT in 09/2018 on Eliquis presented with generalized weakness. Patient was sent by his PCP due to symptomatic anemia needing blood transfusion. Hemoglobin was 6.4. Per admitting physician. Dr Jonelle Sidle, was guaiac positive.  Transfused 2 units with appropriate response.  Stable at 8.2 prior to discharge.  Patient had an EGD on 5/11 that revealed gastritis and nonbleeding angiodysplastic lesion in duodenum.  Pathology obtained.  Pulmonology was consulted out of concern for hemoptysis. Note ANCA and GBM were both negative and per pulm CT is less suggestive of an active vasculitis.  Pulmonology recommended bronchoscopy if further hemoptysis.  However, patient's hemoptysis resolved and he was discharged.   Patient to resume Eliquis on 11/10/2018 per GI recommendation.  See individual problem list below for more.  Discharge Diagnoses:  Symptomatic anemia,upper GI bleed -Hemoglobin 6.4 on admission; received 2 units with appropriate response, and Hgb remained stable at 8.2. -EGD revealed gastritis and nonbleeding angiodysplastic lesion in duodenum -Patient to resume Eliquis on 11/10/2018 per GI -Aspirin discontinued at discharge. -H&H at dialysis. -Protonix 40 mg daily  Hemoptysis: Resolved. -CT per pulm with bilateral effusiions and bibasilar airspace  opacities  Permanent atrial Fibrillation vs flutter: noted on EKG from 5/12. Echo from 06/2018 with normal EF.  Already on beta-blocker and anticoagulation  0.8 cm pulmonary nodule: needs outpatient follow up per rads  Fullness at level of pancreatic tail: follow up outpatient CT abdomen  AKI on hemodialysis - 2/2 pauci immune GN Hemodialysis TTS, nephrology following S/p rituxan, now on oral prednisone taper  Mild hyperkalemia:  -Was given Kayexalate prior to discharge.  Tobacco abuse -Nicotine, cessation consult  Hyperlipidemia -Continue Lipitor  GERD -Continue PPI  Chronic thrombocytopenia, pancytopenia -Recheck CBC at follow-up  DVT right leg 09/2018 -Patient to resume Eliquis on 11/10/2018 -Nephrology to monitor H&H at dialysis  Discharge Instructions  Discharge Instructions    Call MD for:  difficulty breathing, headache or visual disturbances   Complete by:  As directed    Call MD for:  extreme fatigue   Complete by:  As directed    Call MD for:  persistant dizziness or light-headedness   Complete by:  As directed    Call MD for:  persistant nausea and vomiting   Complete by:  As directed    Diet - low sodium heart healthy   Complete by:  As directed    Increase activity slowly   Complete by:  As directed      Allergies as of 11/09/2018      Reactions   Gelatin Other (See Comments)   ALPHA-GAL DANGER   Meat [alpha-gal] Other (See Comments)   REACTION TO HOOVED ANIMALS PARTICULARLY RED MEAT   Pork-derived Products Other (See Comments)   ALPHA-GAL DANGER   Shellfish Allergy Shortness Of Breath   Chicken Allergy Nausea And Vomiting   Ramipril Swelling   Tongue and throat swelling   Codeine Nausea And Vomiting   Morphine  Itching      Medication List    STOP taking these medications   aspirin 81 MG EC tablet     TAKE these medications   Accu-Chek Aviva Plus w/Device Kit Check FBS   apixaban 5 MG Tabs tablet Commonly known as:   ELIQUIS Take 1 tablet (5 mg) by mouth 2 times a day starting 11/10/2018 What changed:    how much to take  how to take this  when to take this  additional instructions   atorvastatin 80 MG tablet Commonly known as:  LIPITOR TAKE 1 TABLET AT BEDTIME   Basaglar KwikPen 100 UNIT/ML Sopn Inject 7 Units into the skin daily.   blood glucose meter kit and supplies Kit Dispense based on patient and insurance preference. Use up to four times daily as directed. (FOR ICD-9 250.00, 250.01).   calcium acetate 667 MG capsule Commonly known as:  PHOSLO Take 1 capsule (667 mg total) by mouth 3 (three) times daily with meals for 30 days.   docusate sodium 100 MG capsule Commonly known as:  COLACE Take 100 mg by mouth daily.   doxazosin 2 MG tablet Commonly known as:  CARDURA Take 1 tablet (2 mg total) by mouth daily for 30 days.   EPINEPHrine 0.3 mg/0.3 mL Soaj injection Commonly known as:  EPI-PEN Inject 0.3 mg into the muscle once as needed for anaphylaxis (severe allergic reaction).   ezetimibe 10 MG tablet Commonly known as:  ZETIA TAKE 1 TABLET BY MOUTH EVERY DAY   ferrous sulfate 325 (65 FE) MG tablet Take 325 mg by mouth daily.   glucose blood test strip Commonly known as:  Accu-Chek Aviva Plus Check FBS DX: E11.9   insulin aspart 100 UNIT/ML injection Commonly known as:  novoLOG Inject 0-5 Units into the skin 3 (three) times daily with meals. CBG 181-200:1 unit,CBG 201-250:2 units.CBG 251-300:3 units.CBG 301-350:5 U   metoprolol succinate 25 MG 24 hr tablet Commonly known as:  TOPROL-XL TAKE 1 TABLET BY MOUTH EVERY DAY   oxyCODONE-acetaminophen 10-325 MG tablet Commonly known as:  PERCOCET Take 1 tablet by mouth every 4 (four) hours as needed for pain.   pantoprazole 40 MG tablet Commonly known as:  PROTONIX Take 1 tablet (40 mg total) by mouth daily for 30 days.   predniSONE 20 MG tablet Commonly known as:  DELTASONE Take 3 tablets (60 mg total) by mouth  daily with breakfast for 30 days. What changed:    how much to take  when to take this  additional instructions   sulfamethoxazole-trimethoprim 400-80 MG tablet Commonly known as:  BACTRIM Take 1 tablet by mouth 3 (three) times a week for 30 days. What changed:  when to take this   Symbicort 160-4.5 MCG/ACT inhaler Generic drug:  budesonide-formoterol INHALE 2 PUFFS INTO THE LUNGS TWICE A DAY      Follow-up Information    [name], MD. Schedule an appointment as soon as possible for a visit in 2 week(s).   Specialty:  Family Medicine Contact information: [name] [place] [address] [phone]        Fay Records, MD .   Specialty:  Cardiology Contact information: [place] [address] [phone]           Consultations:  Nephrology  Nephrology  Pulmonology  Procedures/Studies:  2D Echo: none  Dg Chest 1 View  Result Date: 10/17/2018 CLINICAL DATA:  73 year old male with a history acute  kidney injury EXAM: CHEST  1 VIEW COMPARISON:  No chest x-ray for comparison. Prior CT chest 03/02/2018 FINDINGS: Cardiomediastinal silhouette within normal limits. Fullness in the central vasculature. Interlobular septal thickening. Opacity at the medial aspect of the right upper lobe, appears relatively similar to the scout image from the CT dated 03/02/2018 and the scout image from thoracic spine CT 05/05/2018, likely accentuated by slight right rotation and low lung volumes. No confluent airspace disease.  No pneumothorax or pleural effusion. Right IJ tunneled hemodialysis catheter appears to terminate superior vena cava. IMPRESSION: Reticular opacities may reflect developing fibrosis, with early changes on the prior chest CT of 03/02/2018, however, mild edema could have this appearance. Right IJ tunneled hemodialysis catheter with the tip appearing to terminate superior vena cava. Electronically Signed   By: Corrie Mckusick D.O.   On: 10/17/2018 16:36   Dg Chest 2 View  Result Date: 11/04/2018 CLINICAL DATA:  73 year old male with acute shortness of breath. EXAM: CHEST - 2 VIEW COMPARISON:  10/26/2018 and prior radiographs FINDINGS: The cardiomediastinal silhouette is unremarkable. RIGHT IJ central venous catheter again noted with tip overlying the LOWER SVC. Chronic interstitial opacities again identified. There is no evidence of focal airspace disease, pulmonary edema, suspicious pulmonary nodule/mass, pleural effusion, or pneumothorax. No acute bony abnormalities are identified. IMPRESSION: 1. No evidence of acute cardiopulmonary disease. 2. Chronic interstitial opacities. Electronically Signed   By: Margarette Canada M.D.   On: 11/04/2018 16:48   Dg Chest 2 View  Result Date: 10/26/2018 CLINICAL DATA:  Shortness of breath, swelling in legs EXAM: CHEST - 2 VIEW COMPARISON:  10/17/2018 FINDINGS: RIGHT jugular Port-A-Cath with tip projecting over SVC. Normal heart size, mediastinal contours, and pulmonary vascularity. Atherosclerotic calcifications aorta. Chronic interstitial infiltrates again identified. Calcified granuloma lower LEFT chest. No segmental consolidation, pleural effusion or pneumothorax. Bones unremarkable. IMPRESSION: Chronic interstitial infiltrates throughout both lungs, slightly greater at bases, favor chronic interstitial lung disease. No definite new or segmental infiltrate identified. Electronically Signed   By: Lavonia Dana M.D.   On: 10/26/2018 15:08   Ct Chest Wo Contrast  Result Date: 11/08/2018 CLINICAL DATA:  Hemoptysis EXAM: CT CHEST WITHOUT CONTRAST TECHNIQUE: Multidetector CT imaging of the chest was performed following the standard protocol without IV contrast. COMPARISON:  CT chest dated 03/02/2018. FINDINGS: Cardiovascular: And there is a well-positioned tunneled dialysis catheter with the tip terminating at the cavoatrial junction. The cardiac size is moderately enlarged. Coronary artery  calcifications are noted. Atherosclerotic changes are noted of the thoracic aorta. Mediastinum/Nodes: There are no pathologically enlarged mediastinal or hilar lymph nodes. No significant axillary adenopathy. The thyroid gland is unremarkable. There is no significant supraclavicular adenopathy. Lungs/Pleura: Emphysematous changes are noted bilaterally. There is a 0.8 cm pulmonary nodule in the right upper lobe (axial series 3, image 39). There is some motion artifact that limits evaluation of the lung fields. There is a calcified granuloma in the lingula. Small bilateral pleural effusions are noted. Bibasilar airspace opacities are seen, right worse than left. The trachea is unremarkable. Upper Abdomen: Are multiple coarse calcifications are noted in the dome of the liver. There is slightly greater than expected fullness of the partially visualized pancreatic tail. This appears new since prior study. The adrenal glands are unremarkable. Atherosclerotic changes are noted of the abdominal aorta. A left presumed ax fem bypass graft is noted. The patency of this cannot be determined on this examination. Musculoskeletal: No chest wall mass or suspicious bone lesions identified. IMPRESSION: 1. Small bilateral  pleural effusions, right greater than left. 2. And bibasilar airspace opacities favored to represent compressive atelectasis, however an underlying infiltrate is not entirely excluded. 3. At emphysematous changes bilaterally. 4. A 0.8 cm nodule at the right upper lobe. This is not well appreciated on CT chest dated 03/02/2018 follow-up is recommended. Non-contrast chest CT at 6-12 months is recommended. If the nodule is stable at time of repeat CT, then future CT at 18-24 months (from today's scan) is considered optional for low-risk patients, but is recommended for high-risk patients. This recommendation follows the consensus statement: Guidelines for Management of Incidental Pulmonary Nodules Detected on CT Images:  From the Fleischner Society 2017; Radiology 2017; 284:228-243. 5. Greater than expected soft tissue fullness at the level of the pancreatic tail. This may be artifactual but appears new from CT abdomen pelvis dated 05/06/2018. Correlation with nonemergent outpatient CT of the abdomen is recommended for further evaluation. 6. Well-positioned right-sided tunneled dialysis catheter. A left sided ax fem bypass graft is again noted, the patency of which cannot be terminate on this exam. Aortic Atherosclerosis (ICD10-I70.0) and Emphysema (ICD10-J43.9). Electronically Signed   By: Constance Holster M.D.   On: 11/08/2018 14:30   Ir Fluoro Guide Cv Line Right  Result Date: 10/17/2018 CLINICAL DATA:  End-stage renal disease and need for tunneled hemodialysis catheter. EXAM: TUNNELED CENTRAL VENOUS HEMODIALYSIS CATHETER PLACEMENT WITH ULTRASOUND AND FLUOROSCOPIC GUIDANCE ANESTHESIA/SEDATION: 1.0 mg IV Versed; 25 mcg IV Fentanyl. Total Moderate Sedation Time:   15 minutes. The patient's level of consciousness and physiologic status were continuously monitored during the procedure by Radiology nursing. MEDICATIONS: 2 g IV Ancef. FLUOROSCOPY TIME:  54 seconds.  12.9 mGy. PROCEDURE: The procedure, risks, benefits, and alternatives were explained to the patient. Questions regarding the procedure were encouraged and answered. The patient understands and consents to the procedure. A timeout was performed prior to initiating the procedure. The right neck and chest were prepped with chlorhexidine in a sterile fashion, and a sterile drape was applied covering the operative field. Maximum barrier sterile technique with sterile gowns and gloves were used for the procedure. Local anesthesia was provided with 1% lidocaine. Ultrasound was used to confirm patency of the right internal jugular vein. After creating a small venotomy incision, a 21 gauge needle was advanced into the right internal jugular vein under direct, real-time  ultrasound guidance. Ultrasound image documentation was performed. After securing guidewire access, an 8 Fr dilator was placed. A J-wire was kinked to measure appropriate catheter length. A Palindrome tunneled hemodialysis catheter measuring 23 cm from tip to cuff was chosen for placement. This was tunneled in a retrograde fashion from the chest wall to the venotomy incision. At the venotomy, serial dilatation was performed and a 16 Fr peel-away sheath was placed over a guidewire. The catheter was then placed through the sheath and the sheath removed. Final catheter positioning was confirmed and documented with a fluoroscopic spot image. The catheter was aspirated, flushed with saline, and injected with appropriate volume heparin dwells. The venotomy incision was closed with subcutaneous 3-0 Monocryl and subcuticular 4-0 Vicryl. Dermabond was applied to the incision. The catheter exit site was secured with 0-Prolene retention sutures. COMPLICATIONS: None.  No pneumothorax. FINDINGS: After catheter placement, the tip lies in the right atrium. The catheter aspirates normally and is ready for immediate use. IMPRESSION: Placement of tunneled hemodialysis catheter via the right internal jugular vein. The catheter tip lies in the right atrium. The catheter is ready for immediate use. Electronically Signed  By: Aletta Edouard M.D.   On: 10/17/2018 16:41   Ir US Guide Vasc Access Right  Result Date: 10/17/2018 CLINICAL DATA:  End-stage renal disease and need for tunneled hemodialysis catheter. EXAM: TUNNELED CENTRAL VENOUS HEMODIALYSIS CATHETER PLACEMENT WITH ULTRASOUND AND FLUOROSCOPIC GUIDANCE ANESTHESIA/SEDATION: 1.0 mg IV Versed; 25 mcg IV Fentanyl. Total Moderate Sedation Time:   15 minutes. The patient's level of consciousness and physiologic status were continuously monitored during the procedure by Radiology nursing. MEDICATIONS: 2 g IV Ancef. FLUOROSCOPY TIME:  54 seconds.  12.9 mGy. PROCEDURE: The procedure,  risks, benefits, and alternatives were explained to the patient. Questions regarding the procedure were encouraged and answered. The patient understands and consents to the procedure. A timeout was performed prior to initiating the procedure. The right neck and chest were prepped with chlorhexidine in a sterile fashion, and a sterile drape was applied covering the operative field. Maximum barrier sterile technique with sterile gowns and gloves were used for the procedure. Local anesthesia was provided with 1% lidocaine. Ultrasound was used to confirm patency of the right internal jugular vein. After creating a small venotomy incision, a 21 gauge needle was advanced into the right internal jugular vein under direct, real-time ultrasound guidance. Ultrasound image documentation was performed. After securing guidewire access, an 8 Fr dilator was placed. A J-wire was kinked to measure appropriate catheter length. A Palindrome tunneled hemodialysis catheter measuring 23 cm from tip to cuff was chosen for placement. This was tunneled in a retrograde fashion from the chest wall to the venotomy incision. At the venotomy, serial dilatation was performed and a 16 Fr peel-away sheath was placed over a guidewire. The catheter was then placed through the sheath and the sheath removed. Final catheter positioning was confirmed and documented with a fluoroscopic spot image. The catheter was aspirated, flushed with saline, and injected with appropriate volume heparin dwells. The venotomy incision was closed with subcutaneous 3-0 Monocryl and subcuticular 4-0 Vicryl. Dermabond was applied to the incision. The catheter exit site was secured with 0-Prolene retention sutures. COMPLICATIONS: None.  No pneumothorax. FINDINGS: After catheter placement, the tip lies in the right atrium. The catheter aspirates normally and is ready for immediate use. IMPRESSION: Placement of tunneled hemodialysis catheter via the right internal jugular vein.  The catheter tip lies in the right atrium. The catheter is ready for immediate use. Electronically Signed   By: Aletta Edouard M.D.   On: 10/17/2018 16:41   US Biopsy (kidney)  Result Date: 10/10/2018 INDICATION: 75 year old with acute kidney injury. Request for random renal biopsy. EXAM: ULTRASOUND-GUIDED LEFT RENAL BIOPSY MEDICATIONS: None. ANESTHESIA/SEDATION: Moderate (conscious) sedation was employed during this procedure. A total of Versed 1.5 mg and Fentanyl 50 mcg was administered intravenously. Moderate Sedation Time: 17 minutes. The patient's level of consciousness and vital signs were monitored continuously by radiology nursing throughout the procedure under my direct supervision. FLUOROSCOPY TIME:  None COMPLICATIONS: None immediate. PROCEDURE: Informed written consent was obtained from the patient after a thorough discussion of the procedural risks, benefits and alternatives. All questions were addressed. Maximal Sterile Barrier Technique was utilized including caps, mask, sterile gowns, sterile gloves, sterile drape, hand hygiene and skin antiseptic. A timeout was performed prior to the initiation of the procedure. Both kidneys were evaluated with ultrasound. The left kidney was selected for biopsy. Left flank was prepped with chlorhexidine and sterile field was created. Skin and soft tissues were anesthetized with 1% lidocaine. Using ultrasound guidance, 16 gauge core biopsy needle was directed into the  left kidney lower pole. A total of 2 core biopsies were obtained. Specimens placed in saline. Bandage placed over the puncture site. FINDINGS: Negative for hydronephrosis. Core biopsies obtained from the left kidney lower pole. Negative for bleeding or hematoma formation following the core biopsies. IMPRESSION: Ultrasound-guided core biopsies of the left kidney lower pole. Electronically Signed   By: Markus Daft M.D.   On: 10/10/2018 17:15   Vas [place] Lower Extremity Venous (dvt)  Result Date:  10/28/2018  Lower Venous Study Indications: Swelling, SOB, and positive D-Dimer. Other Indications: Patient c/o shortness of breath and leg swelling. Patient                    inpatient hospital x 24 days from 09/27/18-10/20/18. Anticoagulation: Plavix. Performing Technologist: Salvadore Dom RVT, RDCS (AE), RDMS  Examination Guidelines: A complete evaluation includes B-mode imaging, spectral Doppler, color Doppler, and power Doppler as needed of all accessible portions of each vessel. Bilateral testing is considered an integral part of a complete examination. Limited examinations for reoccurring indications may be performed as noted.  +---------+---------------+---------+-----------+------------------+-------+  RIGHT     Compressibility Phasicity Spontaneity Properties         Summary  +---------+---------------+---------+-----------+------------------+-------+  CFV       Full            Yes       Yes                                     +---------+---------------+---------+-----------+------------------+-------+  SFJ       Full            Yes       Yes                                     +---------+---------------+---------+-----------+------------------+-------+  FV Prox   Partial         No        No                                      +---------+---------------+---------+-----------+------------------+-------+  FV Mid    None            No        No          brightly echogenic Chronic  +---------+---------------+---------+-----------+------------------+-------+  FV Distal None            No        No          brightly echogenic Chronic  +---------+---------------+---------+-----------+------------------+-------+  PFV       Full                                                              +---------+---------------+---------+-----------+------------------+-------+  POP       Partial         Yes       Yes         brightly echogenic Chronic  +---------+---------------+---------+-----------+------------------+-------+   PTV       Full  Yes                                               +---------+---------------+---------+-----------+------------------+-------+  PERO      Full            Yes                                               +---------+---------------+---------+-----------+------------------+-------+  Gastroc   Full                                                              +---------+---------------+---------+-----------+------------------+-------+  GSV       Full            Yes       Yes                                     +---------+---------------+---------+-----------+------------------+-------+  +---------+---------------+---------+-----------+----------+-------+  LEFT      Compressibility Phasicity Spontaneity Properties Summary  +---------+---------------+---------+-----------+----------+-------+  CFV       Full            Yes       Yes                             +---------+---------------+---------+-----------+----------+-------+  SFJ       Full            Yes       Yes                             +---------+---------------+---------+-----------+----------+-------+  FV Prox   Full            Yes       Yes                             +---------+---------------+---------+-----------+----------+-------+  FV Mid    Full            Yes       Yes                             +---------+---------------+---------+-----------+----------+-------+  FV Distal Full            Yes       Yes                             +---------+---------------+---------+-----------+----------+-------+  PFV       Full                                                      +---------+---------------+---------+-----------+----------+-------+  POP       Full            Yes       Yes                             +---------+---------------+---------+-----------+----------+-------+  PTV       Full            Yes       Yes                             +---------+---------------+---------+-----------+----------+-------+  PERO      Full             Yes       Yes                             +---------+---------------+---------+-----------+----------+-------+  Gastroc   Full                                                      +---------+---------------+---------+-----------+----------+-------+  GSV       Full            Yes       Yes                             +---------+---------------+---------+-----------+----------+-------+  Findings reported to [place] at Dr. Antony Contras office at 11:30am.  Summary:  Right: Findings consistent with chronic deep vein thrombosis involving the right femoral vein, and right popliteal vein. No cystic structure found in the popliteal fossa. All other veins visualized appear fully compressible and demonstrate appropriate Doppler characteristics.  Left: No evidence of deep vein thrombosis in the lower extremity. No indirect evidence of obstruction proximal to the inguinal ligament. No cystic structure found in the popliteal fossa.  *See table(s) above for measurements and observations. Electronically signed by [name] MD on 10/28/2018 at 1:37:31 PM.    Final       Subjective: No major events overnight of this morning.  No complaint this morning.  No further hemoptysis or GI bleeding.  Denies chest pain, dyspnea, cough or abdominal pain.  Denies dizziness or lightheadedness.  Feels ready to go home.  Discharge Exam: Vitals:   11/09/18 0803 11/09/18 0905  BP:  132/74  Pulse:  80  Resp:    Temp:    SpO2: 98% 99%    GENERAL: No acute distress.  Appears well.  HEENT: MMM.  Vision and hearing grossly intact.  NECK: Supple.  No JVD.  LUNGS:  No IWOB. Good air movement bilaterally. HEART:  RRR. Heart sounds normal.  ABD: Bowel sounds present. Soft. Non tender.  MSK/EXT:  Moves all extremities. No apparent deformity. No edema bilaterally.  SKIN: no apparent skin lesion or wound NEURO: Awake, alert and oriented appropriately.  No gross deficit.  PSYCH: Calm. Normal affect.     The results of significant  diagnostics from this hospitalization (including imaging, microbiology, ancillary and laboratory) are listed below for reference.     Microbiology: Recent Results (from the past 240 hour(s))  MRSA PCR Screening     Status: None   Collection Time: 11/05/18  1:41 AM  Result Value Ref Range Status   MRSA by PCR NEGATIVE NEGATIVE Final    Comment:        The GeneXpert MRSA Assay (FDA approved for NASAL specimens only), is one component of a comprehensive MRSA colonization surveillance program. It is not intended to diagnose MRSA infection nor to guide or monitor treatment for MRSA infections. Performed at [facility], [place] [address] [place], [place] [address]   Urine Culture     Status: None   Collection Time: 11/05/18  6:02 AM  Result Value Ref Range Status   Specimen Description URINE, RANDOM  Final   Special Requests NONE  Final   Culture   Final    NO GROWTH Performed at [facility], [place] [address], [place], [place] [address]    Report Status 11/06/2018 FINAL  Final  SARS Coronavirus 2 (CEPHEID - Performed in [place] hospital lab), Hosp Order     Status: None   Collection Time: 11/07/18 11:25 AM  Result Value Ref Range Status   SARS Coronavirus 2 NEGATIVE NEGATIVE Final    Comment: (NOTE) If result is NEGATIVE SARS-CoV-2 target nucleic acids are NOT DETECTED. The SARS-CoV-2 RNA is generally detectable in upper and lower  respiratory specimens during the acute phase of infection. The lowest  concentration of SARS-CoV-2 viral copies this assay can detect is 250  copies / mL. A negative result does not preclude SARS-CoV-2 infection  and should not be used as the sole basis for treatment or other  patient management decisions.  A negative result may occur with  improper specimen collection / handling, submission of specimen other  than nasopharyngeal swab, presence of viral mutation(s) within the  areas targeted by this assay, and inadequate number of  viral copies  (<250 copies / mL). A negative result must be combined with clinical  observations, patient history, and epidemiological information. If result is POSITIVE SARS-CoV-2 target nucleic acids are DETECTED. The SARS-CoV-2 RNA is generally detectable in upper and lower  respiratory specimens dur ing the acute phase of infection.  Positive  results are indicative of active infection with SARS-CoV-2.  Clinical  correlation with patient history and other diagnostic information is  necessary to determine patient infection status.  Positive results do  not rule out bacterial infection or co-infection with other viruses. If result is PRESUMPTIVE POSTIVE SARS-CoV-2 nucleic acids MAY BE PRESENT.   A presumptive positive result was obtained on the submitted specimen  and confirmed on repeat testing.  While 2019 novel coronavirus  (SARS-CoV-2) nucleic acids may be present in the submitted sample  additional confirmatory testing may be necessary for epidemiological  and / or clinical management purposes  to differentiate between  SARS-CoV-2 and other Sarbecovirus currently known to infect humans.  If clinically indicated additional testing with an alternate test  methodology [phone]) is advised. The SARS-CoV-2 RNA is generally  detectable in upper and lower respiratory sp ecimens during the acute  phase of infection. The expected result is Negative. Fact Sheet for Patients:  StrictlyIdeas.no Fact Sheet for Healthcare Providers: BankingDealers.co.za This test is not yet approved or cleared by the [place] FDA and has been authorized for detection and/or diagnosis of SARS-CoV-2 by FDA under an Emergency Use Authorization (EUA).  This EUA will remain in effect (meaning this test can be used) for the duration of the COVID-19 declaration under Section 564(b)(1) of the Act, 21 U.S.C. section 360bbb-3(b)(1), unless the authorization is  terminated or  revoked sooner. Performed at [facility], [place] [address], [place], [place] [address]      Labs: BNP (last 3 results) Recent Labs    10/26/18 1641  BNP 932.3*   Basic Metabolic Panel: Recent Labs  Lab 11/04/18 1600 11/05/18 0702 11/07/18 0609 11/08/18 0520 11/09/18 0454  NA 141 141 135 134* 135  K 4.5 4.8 3.9 4.1 5.4*  CL 104 105 97* 98 98  CO2 '24 27 27 22 27  ' GLUCOSE 247* 97 70 80 207*  BUN 59* 70* 54* 63* 46*  CREATININE 4.08* 4.33* 3.79* 4.46* 3.85*  CALCIUM 7.5* 7.8* 7.5* 7.3* 7.3*   Liver Function Tests: Recent Labs  Lab 11/04/18 1600 11/05/18 0702  AST 37 33  ALT 29 29  ALKPHOS 42 33*  BILITOT 0.9 1.3*  PROT 4.6* 4.8*  ALBUMIN 2.0* 2.1*   No results for input(s): LIPASE, AMYLASE in the last 168 hours. No results for input(s): AMMONIA in the last 168 hours. CBC: Recent Labs  Lab 11/04/18 0953 11/04/18 1600  11/05/18 0702 11/06/18 0655 11/07/18 0609 11/08/18 0520 11/09/18 0454  WBC 3.4* 2.3*  --  4.5 3.9* 4.7 4.5 2.5*  NEUTROABS 2,917 2.0  --   --   --   --   --   --   HGB 6.8* 6.4*   < > 9.0* 8.9* 9.1* 8.8* 8.6*  HCT 20.8* 20.9*   < > 27.7* 27.5* 27.1* 27.3* 26.7*  MCV 92.4 98.6  --  94.5 93.2 92.5 93.8 96.0  PLT 107* 99*  --  101* 88* 88* 82* 72*   < > = values in this interval not displayed.   Cardiac Enzymes: Recent Labs  Lab 11/04/18 1600  TROPONINI 0.04*   BNP: Invalid input(s): POCBNP CBG: Recent Labs  Lab 11/08/18 1222 11/08/18 1647 11/08/18 2107 11/09/18 0610 11/09/18 1133  GLUCAP 146* 164* 135* 168* 178*   D-Dimer No results for input(s): DDIMER in the last 72 hours. Hgb A1c No results for input(s): HGBA1C in the last 72 hours. Lipid Profile No results for input(s): CHOL, HDL, LDLCALC, TRIG, CHOLHDL, LDLDIRECT in the last 72 hours. Thyroid function studies Recent Labs    11/08/18 1913  TSH 3.168   Anemia work up No results for input(s): VITAMINB12, FOLATE, FERRITIN, TIBC, IRON, RETICCTPCT  in the last 72 hours. Urinalysis    Component Value Date/Time   COLORURINE YELLOW 11/05/2018 0601   APPEARANCEUR CLEAR 11/05/2018 0601   LABSPEC 1.012 11/05/2018 0601   PHURINE 7.0 11/05/2018 0601   GLUCOSEU NEGATIVE 11/05/2018 0601   HGBUR LARGE (A) 11/05/2018 0601   BILIRUBINUR NEGATIVE 11/05/2018 0601   KETONESUR NEGATIVE 11/05/2018 0601   PROTEINUR 100 (A) 11/05/2018 0601   NITRITE NEGATIVE 11/05/2018 0601   LEUKOCYTESUR NEGATIVE 11/05/2018 0601   Sepsis Labs Invalid input(s): PROCALCITONIN,  WBC,  LACTICIDVEN   Time coordinating discharge: 40 minutes  SIGNED:  Mercy Riding, MD  [facility] 11/09/2018, 11:59 AM Pager [phone]  If 7PM-7AM, please contact night-coverage [email or web address] Password [email or web address]

## 2018-11-10 ENCOUNTER — Inpatient Hospital Stay: Payer: Medicare HMO | Admitting: Family Medicine

## 2018-11-10 DIAGNOSIS — N179 Acute kidney failure, unspecified: Secondary | ICD-10-CM | POA: Diagnosis not present

## 2018-11-10 DIAGNOSIS — N2581 Secondary hyperparathyroidism of renal origin: Secondary | ICD-10-CM | POA: Diagnosis not present

## 2018-11-12 DIAGNOSIS — N2581 Secondary hyperparathyroidism of renal origin: Secondary | ICD-10-CM | POA: Diagnosis not present

## 2018-11-12 DIAGNOSIS — N179 Acute kidney failure, unspecified: Secondary | ICD-10-CM | POA: Diagnosis not present

## 2018-11-14 ENCOUNTER — Inpatient Hospital Stay: Payer: Medicare HMO | Admitting: Family Medicine

## 2018-11-14 ENCOUNTER — Other Ambulatory Visit: Payer: Self-pay

## 2018-11-14 ENCOUNTER — Encounter: Payer: Self-pay | Admitting: Family Medicine

## 2018-11-14 ENCOUNTER — Ambulatory Visit (INDEPENDENT_AMBULATORY_CARE_PROVIDER_SITE_OTHER): Payer: Medicare HMO | Admitting: Family Medicine

## 2018-11-14 VITALS — BP 112/60 | HR 78 | Temp 97.6°F | Resp 14 | Ht 70.0 in | Wt 188.0 lb

## 2018-11-14 DIAGNOSIS — N059 Unspecified nephritic syndrome with unspecified morphologic changes: Secondary | ICD-10-CM

## 2018-11-14 DIAGNOSIS — R7989 Other specified abnormal findings of blood chemistry: Secondary | ICD-10-CM | POA: Diagnosis not present

## 2018-11-14 DIAGNOSIS — D649 Anemia, unspecified: Secondary | ICD-10-CM | POA: Diagnosis not present

## 2018-11-14 DIAGNOSIS — Z09 Encounter for follow-up examination after completed treatment for conditions other than malignant neoplasm: Secondary | ICD-10-CM

## 2018-11-14 DIAGNOSIS — Z992 Dependence on renal dialysis: Secondary | ICD-10-CM | POA: Diagnosis not present

## 2018-11-14 DIAGNOSIS — I82401 Acute embolism and thrombosis of unspecified deep veins of right lower extremity: Secondary | ICD-10-CM

## 2018-11-14 DIAGNOSIS — N179 Acute kidney failure, unspecified: Secondary | ICD-10-CM | POA: Diagnosis not present

## 2018-11-14 NOTE — Progress Notes (Signed)
Subjective:    Patient ID: [name]., male    DOB: 1946/06/05, 73 y.o.   MRN: [id number]  HPI  07/19/18 At my last visit, the patient was having symptoms from her concerning for claudication.  Initially her work-up included evaluation of his peripheral vascular disease.  However it turned out the patient was having neurogenic claudication secondary to thoracic myelopathy.  Recently admitted to the hospital for back surgery.  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  Postop [name] doing very well.  Preoperative back and lower extremity pain much improved.  Standing and walking much better.  Continent of urine.  Patient feels much better and ready for discharge home.  07/19/18 Here today for follow-up.  I reviewed labs from June 02, 2018.  At that time he had a BMP which showed a creatinine of 0.99 as well as a blood sugar of 109.  Hemoglobin A1c was obtained at that point and showed excellent glycemic control at 5.3.  A CBC was obtained which was significant for a normal white blood cell count, normal hemoglobin however mild thrombocytopenia with platelet count of 106.  Patient is overdue for a fasting lipid panel.  He is taking Lipitor 80 mg a day as well as Zetia 10 mg a day.  Ideally his LDL cholesterol be less than 70.  Given his extensive cardiovascular history including significant peripheral vascular disease as well as coronary artery disease with history of stenting of his right coronary artery, patient is at high risk for congestive heart failure.  Patient states that he is doing much  better after the surgery.  The pain has improved in his legs with ambulation.  He is no longer having to use a cane or walker to get around.  However he reports severe fatigue.  He states that he sleeps sometimes 12 to 15 hours a day.  However he still feels extremely tired.  He can easily fall asleep despite getting a good night's rest the night before.  He never feels rested.  He is always sleepy.  He denies any chest pain however he does have chronic shortness of breath with exertion.  He denies any angina.  He denies any weight loss or fevers.  At that time, my plan was: Given the patient's extensive past medical history, the differential diagnosis for his fatigue and hypersomnolence is large.  The biggest concern I have is for obstructive sleep apnea.  He was scheduled for a sleep study in 2017 however I do not see where that ever occurred.  Therefore I recommended a referral for a sleep study to evaluate for obstructive sleep apnea.  This may also explain his elevated blood pressure as well.  Also given his history, I would be concerned about congestive heart failure and therefore I would recommend an echocardiogram.  The patient does have congestive heart failure, in addition to tailoring his medication, I will switch him from metformin to [place] for his diabetes.  His blood pressure is elevated today however I am concerned that his medication list is incorrect.  The patient is not certain exactly what he is taking.  Therefore of asked the patient  to go home and call us back immediately and talk to my nurse and give [place] an updated medicine list with exactly what medication he is taking we can adjust his medication appropriately to achieve control his blood pressure.  I would also check a CBC, CMP, TSH, B12.    09/23/18 My recommendations based on his labs were: Labs show mild anemia but I do not believe this is causing his fatigue. Recommend sleep study. Wt Readings from Last 3 Encounters:  11/14/18  188 lb (85.3 kg)  11/09/18 187 lb 3.2 oz (84.9 kg)  11/02/18 185 lb (83.9 kg)   Since the patient's visit in January, he has lost 12 pounds.  Over the last few weeks he has developed epigastric pain.  It is worse as soon as he eats.  He reports nausea and vomiting.  He denies any melena or hematochezia.  He denies any bright red blood per rectum.  Pain is located in the epigastric area.  However his abdomen today is soft nondistended with normal bowel sounds and no masses other than the abdominal bruit secondary to his bypass.  Patient states however he developed severe epigastric pain as soon as he eats.  Even the thought of food makes him sick.  He denies any chest pain or shortness of breath.  He denies any jaundice.  Patient still has his gallbladder.  CT scan was performed of the abdomen and pelvis in November.  Results are dictated below: FINDINGS: Lower chest: Heart size is normal. There is no significant pericardial fluid, thickening or pericardial calcification. Calcifications of the aortic valve. There is aortic atherosclerosis, as well as atherosclerosis of the coronary arteries, including calcified atherosclerotic plaque in the left main, left anterior descending, left circumflex and right coronary arteries.  Hepatobiliary: Several small calcified granulomas are noted in the liver, including an irregular cluster of calcified granulomas in the right lobe near the dome, which appeared suspicious for potential lesion on prior examinations. However, no discrete suspicious cystic or solid hepatic lesions are identified on today's examination. No intra or extrahepatic biliary ductal dilatation. Gallbladder is normal in appearance.  Pancreas: No pancreatic mass. No pancreatic ductal dilatation. No pancreatic or peripancreatic fluid or inflammatory changes.  Spleen: Unremarkable.  Adrenals/Urinary Tract: 3 mm nonobstructive calculus in the lower pole collecting system of the left  kidney. Multiple subcentimeter low-attenuation lesions are noted in both kidneys, too small to definitively characterize, but statistically likely to represent tiny cysts. No hydroureteronephrosis in the visualized portions of the abdomen. Bilateral adrenal glands are normal in appearance.  Stomach/Bowel: Normal appearance of the stomach. No pathologic dilatation of visualized portions of small bowel or colon. Numerous colonic diverticulae are noted, without surrounding inflammatory changes to suggest an acute diverticulitis at this time.  Vascular/Lymphatic: Aortic atherosclerosis with complete occlusion of the infrarenal abdominal aorta. Reconstitution of flow in the pelvic vasculature, presumably related to the patient's partially visualized bypass graft overlying the left lower hemithorax and left anterior abdominal wall, presumably an axillary femoral bypass graft. No lymphadenopathy noted in the abdomen or pelvis.  Other: No significant volume of ascites and no pneumoperitoneum in the visualized portions of the abdomen.  Musculoskeletal: There are no aggressive appearing lytic or blastic lesions noted in the visualized portions of the skeleton.  IMPRESSION: 1. No suspicious liver lesions. The findings on the prior study appear to represent an irregular cluster of calcified granulomas. This is a benign finding. 2. Aortic atherosclerosis, in addition to left main and 3 vessel coronary  artery disease. In addition, there is complete occlusion of the infrarenal abdominal aorta. Reconstitution of flow in the pelvis presumably from the patient's left-sided bypass graft. 3. 3 mm nonobstructive calculus in the lower pole collecting system of left kidney. Although not a definitive study for the gallbladder, there was no mention of any gallstones.  At that time, my plan was: Differential diagnosis includes pancreatitis versus pancreatic neoplasm, biliary colic, intestinal  ischemia, peptic ulcer disease, gastric cancer, or medication reaction to metformin.  Patient would like to stop metformin and losartan because he believes the symptoms started right when he started these medications.  I will evaluate for pancreatic inflammation with a lipase as well as a CBC and a CMP.  I am very concerned about intestinal ischemia given his past medical history.  However I also believe he needs a GI consultation for EGD to rule out gastric cancer or peptic ulcer disease.  Obtain CT angiogram to evaluate for intestinal ischemia of the abdomen.  Also, patient has had numerous abdominal surgeries so intestinal adhesions possibly: Partial bowel obstruction would be also on the differential however I believe this is less likely given the fact his abdomen is soft and nondistended today  09/26/18 Patient's lab work returned markedly abnormal this morning.  Creatinine had risen to 4.67.  Hemoglobin had dropped to 9.8.  Given the acute renal failure, I recommended possibly going to the emergency room.  However when we called the patient, he stated that he felt better after discontinuing his medication.  At his last office visit I discontinued losartan and metformin as described above.  He states that he felt much better and was not vomiting.  Therefore we elected to try to keep the patient on the hospital and reevaluate him here today for his acute renal failure.  As discussed above, the patient states that he was unable to eat hardly anything for the last 3 weeks.  He was drinking a lot of water to try to maintain his hydration but he was not able to keep down hardly any solid food.  The patient states that he felt like he was dying last week.  However since discontinuing the metformin and the losartan, he states that the nausea and vomiting has completely stopped.  He is now able to eat and drink better.  Patient states that he is about 50% better.  His only lingering symptom is fatigue and weakness.   He states that he just has very little energy.  At that time, my plan was: Patient wants to avoid going to the hospital especially during the coronavirus pandemic.  Therefore we will institute a work-up for acute renal failure as an outpatient as long as it is reasonable.  At the present time he has no urgent sign for dialysis.  He is not acidotic.  He is not fluid overloaded.  He has no electrolyte disturbances, he is not uremic.  Therefore the present time he is still capable of being worked up as an outpatient.  I will obtain a renal ultrasound to evaluate for any evidence of obstructive nephropathy, renal masses, etc.  I will obtain a renal artery ultrasound given the fact his acute renal failure occurred while taking losartan to rule out renal artery stenosis particular given his underlying vascular history.  I will obtain a urinalysis to evaluate for any evidence of glomerulonephritis.  I will check an SPEP to evaluate for any evidence of multiple myeloma.  However my suspicion to date as I discussed  with the patient is I believe this was multifactorial and primarily due to prerenal azotemia.  I believe his nausea and diarrhea was triggered by the metformin.  I believe this led to dehydration that caused acute renal insufficiency.  I believe then this was exacerbated by his angiotensin receptor blocker which worsened his renal insufficiency via acute tubular necrosis.  I then suspect that the patient may have developed lactic acidosis due to the metformin causing his abdominal pain.  Simply discontinuing his medications have improved his symptoms dramatically.  Therefore I will obtain a BMP to monitor his electrolytes and creatinine today.  Patient will be rehydrated with 1 L of normal saline.  Begin the work-up and recheck the patient later this week and encourage patient to push fluids over the week to improve hydration.  I plan to have the patient come in for lab work on Wednesday to repeat his BMP and  then I would like to see him back in the office on Thursday to see how he is doing.  Urinalysis today shows +3 blood +3 protein.  Is negative nitrites.  Negative leukocyte esterase.  Microscopic analysis shows 0-5 white blood cells 40-60 red blood cells 0-5 squames few bacteria no crystals no casts no yeast.  I believe this is more likely suggestive of ATN  09/27/18 Patient is here today for follow-up as planned.  He had his renal ultrasound this morning.  The results are dictated below however no hydronephrosis or obstructive nephropathy is seen: Right Kidney:  Renal measurements: 12.4 x 6.5 x 5.4 cm = volume: 229 mL . Echogenicity within normal limits. No mass or hydronephrosis visualized.  Left Kidney:  Renal measurements: 14.5 x 5.7 x 6.5 cm = volume: 279 mL. Small cysts in the left kidney, the largest 1.2 cm. Normal echotexture. No hydronephrosis.  Bladder:  Appears normal for degree of bladder distention.  Unfortunately, patient's creatinine has worsened further.  It is now up to 6.02.  More concerning for that is now the patient is demonstrating acidosis as his bicarb is dropped to 15.  He is also demonstrating possible symptoms of uremia.  He reports increasing fatigue.  His weakness has worsened since yesterday despite receiving IV fluids.  He also reports mild confusion.  For instance this morning, when he went to pay to do the ultrasound, he gave the radiologist his debit card instead of his credit card.  When they asked for his credit card, he gave him his driver's license.  Patient states that he feels more lethargic and weak today than he did yesterday.  At that time, my plan was: Unfortunately, the patient's acute renal failure seems to be worsening despite receiving IV fluids and stopping his medications.  I suspect acute tubular necrosis based on his urinalysis results.  Unfortunately he is failing outpatient conservative therapy and therefore I believe he needs admission  with IV fluids and nephrology consultation.  I do not feel this can safely be performed as an outpatient as the patient is starting to demonstrate symptoms of uremia with his mild confusion this morning and his worsening fatigue.  His lab work also suggest mild metabolic acidosis which may necessitate dialysis if he continues down this pathway.  Therefore he will go directly to the emergency room today.  We will call and notify them of his impending arrival  10/24/18 Patient was admitted to the hospital and renal was consulted ultimately the patient underwent a renal biopsy which revealed glomerulonephritis.  I have copied relevant  portions of the discharge summary and included them below for my reference:  Admit date: 09/27/2018 Discharge date: 10/20/2018  Brief/Interim Summary: 73 year old male with past medical history of diabetes mellitus, hypertension and COPD plus history of bladder cancer admitted on 3/31 after coming in with worsening renal failure after being sent over from his primary care doctor's office. Patient at that time related bouts of diarrhea for the past 3 weeks and also found to have a urinalysis with 3+ proteinuria and hematuria. Nephrology consulted and patient went renal biopsy on 10/10/18. 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 Uro was consulted and s/pCystoscopy and cleared for Rituxan infusiondue to history of recurrent bladder cancer. he has been started on dialysis on 4/10/20w Rt IJ HD cath.  4/22: OP HD has been set [place]. At this point patient has been stabilized medically, is on regular scheduled dialysis which is set up for TTS.  We will continue on steroids, prophylactic Bactrim, follow-up with nephrology for outpatient lab monitoring and rituximab infusion and renal function  monitoring.  Discharge Diagnoses:   Acute renal failure:Status post renal biopsy 4/13 with pauci immune GNand minimal scarring,follow-up started on systemic steroid to be tapered off over 8 to 12 weeks, to cont at 60 mg/day for now, s/p rituximab IV x1 then in 24 day.on HD since 4/10,last HD 4/23. Has a right IJ dialysis catheter and is set up for outpatient dialysis TTS at [facility] starting 4/25 at 11 AM.Continue low K diet.  Discussed with the nephrologist this morning and he stable for discharge home today after dialysis.  Acute hypoxic respiratory failure from fluid overload due to renal failure.  Improved with dialysis.EF showed stable.  Pauci immuneGN:See #1.Rituximab as above, steroid and BactrimSSprophylaxis 3 times a week as per nephrology. TOC will arrange meds prior to d/c.  History of recurrent bladder cancer, seen by urology and underwent bedside cystoscopyand has noRecurrence.  Type 2 diabetes mellitus without complication,hba1c 5.3 in 05/2018. Stable mostlybut was hypoglycemic at times. Advised close monitoring of blood sugar at home  and use modified sliding scale insulin only for high blood sugar >180, since patient is on high-dose steroids and will be continued on it for some time, he is at risk for uncontrolled hyperglycemia and its complication. Patient reports that his daughter is diabetic and knows how to do that.  Patient agreed to continue on the insulin and is educated. Pt educated on hypoglycemia how to recognize and treat.    CAD/HTN/HLD:Stable with no chest pain. Decreased metoprolol to 25 given bradycardia and decrease Cardura.  Continue Zetia Lipitor and home Plavix  Anemia:hemoglobin stable, status post IV iron infusion, continue ESA per has a right IJ dialysis catheter  Tobacco abusecontinue nicotine patch  Patient is here today for follow-up.  Patient is accompanied by his daughter.  He is currently on Tuesday Thursday  Saturday dialysis.  He has a central line but they are currently using.  He was discharged on rapid acting insulin via sliding scale however he has not been using it as he was confused as to how to administer the insulin and when to administer the insulin.  He is also confused as to the reason his kidneys have shut down and exactly what his disease is.  Both he and his daughter have several questions.  My concern is on physical exam, the patient is tachypneic and seems to be breathing heavily.  He states that he is always short of breath however it has worsened since he was in the hospital.  He has +1 bipedal edema in his feet and his ankles but this stops just above his ankles.  His lungs are actually clear to auscultation bilaterally today.  I appreciate no evidence of pneumonia or pulmonary edema.  He is not wheezing.  He denies any fever or chest pain or pleurisy.  However he does report shortness of breath with minimal activity.  At that time, my plan was: I spent approximately 30 minutes today with the patient and his daughter explaining his condition.  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  I explained that without his kidneys he will die.  Therefore he will be on dialysis until his kidney function hopefully improves.  I explained that the reason he is taking high-dose prednisone as well as the Rituxan is to suppress his immune system with the hopes that the kidney function will recover.  Unfortunately the prednisone will cause hypoglycemia.  Therefore of asked him to check his blood sugar before meals and before bedtime.  I wrote the patient a sliding scale direction.  He will give 0 units sugars less than 150, 2 units from 1 51-200, 4 units from 201 to 250, 6 units from 2 51-300, 8 units from 301-400, and 10 units greater than 400.  We will recheck via telephone and calculate his total insulin requirement and then make adjustments in 1  week.  He will follow-up as planned with nephrology.  Given his shortness of breath I will check a d-dimer today and if elevated will send the patient for a VQ scan to evaluate for pulmonary embolism given his prolonged immobilization and tachypnea and shortness of breath.  Addendum from 10/25/2018  Patient's d-dimer returned positive at greater than 4.  However this could possibly be due to his underlying posse immune glomerulonephritis.  Initially I wanted to get a VQ scan to evaluate for pulmonary embolism.  This was ordered stat this morning.  However according to radiology because of the COVID-19 restrictions they are not allowed to perform the ventilation portion of the VQ scan making this test and effective in evaluating for pulmonary embolism.  My pretest probability is intermediate and I am unwilling to risk his renal function with a CT angiogram of the chest.  Therefore I made the decision to obtain stat ultrasounds of his lower extremities to evaluate for any evidence of DVT.  My rationale is that if the patient has a pulmonary embolism it would come from a DVT in his legs and we should see residual clot burden in his legs.  This would not affect his kidney function and we can get this test ordered stat.  I will also perform a chest x-ray to evaluate for any evidence of pneumonia or underlying pulmonary issues.  I suspect that his dyspnea is multifactorial due to his anemia, underlying cardiovascular disease.  Although not ideal, I feel that this is the safest way to rule out pulmonary embolism in an intermediate risk patient if the patient develops chest pain however I would insist on a CT angiogram.  Patient was confirmed to have a chronic DVT in his right leg and given his shortness of breath it was recommended that he go to the hospital for heparin crossover given his chronic kidney disease and hemodialysis dependency until Coumadin reached therapeutic levels.  I have copied relevant  portions of  his discharge summary below for my reference: Admit date: 10/26/2018 Discharge date: 10/27/2018  Time spent: 45 minutes  Recommendations for Outpatient Follow-up:  1. Follow up with PCP 1-2 weeks for evaluation of symptoms. Recommend cbc to track Hg and platelets 2. Dialysis per schedule   Discharge Diagnoses:  Principal Problem:   DVT (deep venous thrombosis) ([name]) Active Problems:   ESRD on dialysis ([employer])   Elevated troponin   Hyperlipidemia   Essential hypertension   COPD (chronic obstructive pulmonary disease) ([name])   Type II diabetes mellitus with renal manifestations ([name])   CAD (coronary artery disease)   Pancytopenia ([name])   Hypomagnesemia   TOBACCO ABUSE   GERD (gastroesophageal reflux disease)   History of present illness:  Patient presented 4/29 with shortness of breath for more than 1 week and bilateral legpain andswelling for more than 3 weeks.He stated his right leg worse than the left.Patient denied chest pain, cough, fever or chills. He was seen by PCP 4/29, who suspected DVT and PE. He had LE doppler today showing chronic DVT.V/Q scanswas notdone due to COVID issues.CT angio of lungswas not doneas the hope is that hemay have chance torecover renal function (recently hospitalized with AKI secondary to pauci immune GN on dialysis).Patient denied any nausea vomiting, diarrhea or abdominal pain. Denied dark stool or rectal bleeding. Denied symptoms of UTI or unilateral weakness  Hospital Course:  DVT (deep venous thrombosis) ([place]): LE doppler in PCP's office showedchronic deep vein thrombosis involving the right femoral vein, and right popliteal vein. Since pt also has SOB, cannot completely rule out possibility for PE, therefore he was started on a blood thinner. Pt also with pancytopenia.His baseline hemoglobin is about 8.5 at discharge Hg 8.3. Home meds include plavix prescribed by cardiology 2012. Patient denied dark stool or rectal  bleeding. Treatment options discuss with nephrology vascular and cardiology. Nephrology ok's eliquis 33m. Vascular ok's stopping plavix from their standpoint. Cardiology stated given high risk of bleeding an antiplatelet is preferred. Recommended asa 818mwith asa and close monitoring of Hg.   Hyperlipidemia: continue lipitor andfenofibrate  Tobacco abuse: Did counseling about importance of quitting smoking  HTN: fair control. Continue home medications:Metoprolol  COPD [place]chronic obstructive pulmonary disease) [place] -dulera inhaler and prn albuterol inhaler  Type II diabetes mellitus with renal manifestations ([name]):Last A1c5.3 on 92/5/19, well controled. Patient is taking novologat home  CADand elevated troponin: :no CP, but trop slightly elevated but flat. In setting of ESRD, possibly due to decreased clearance. Ekg without acute changes.   GERD (gastroesophageal reflux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

## 2018-11-15 DIAGNOSIS — N179 Acute kidney failure, unspecified: Secondary | ICD-10-CM | POA: Diagnosis not present

## 2018-11-15 DIAGNOSIS — N2581 Secondary hyperparathyroidism of renal origin: Secondary | ICD-10-CM | POA: Diagnosis not present

## 2018-11-16 DIAGNOSIS — N179 Acute kidney failure, unspecified: Secondary | ICD-10-CM | POA: Diagnosis not present

## 2018-11-16 DIAGNOSIS — N2581 Secondary hyperparathyroidism of renal origin: Secondary | ICD-10-CM | POA: Diagnosis not present

## 2018-11-17 ENCOUNTER — Other Ambulatory Visit: Payer: Self-pay | Admitting: Family Medicine

## 2018-11-17 DIAGNOSIS — N179 Acute kidney failure, unspecified: Secondary | ICD-10-CM | POA: Diagnosis not present

## 2018-11-17 DIAGNOSIS — N059 Unspecified nephritic syndrome with unspecified morphologic changes: Secondary | ICD-10-CM

## 2018-11-17 DIAGNOSIS — N2581 Secondary hyperparathyroidism of renal origin: Secondary | ICD-10-CM | POA: Diagnosis not present

## 2018-11-17 DIAGNOSIS — D696 Thrombocytopenia, unspecified: Secondary | ICD-10-CM

## 2018-11-17 LAB — CBC WITH DIFFERENTIAL/PLATELET
Absolute Monocytes: 58 cells/uL — ABNORMAL LOW (ref 200–950)
Basophils Absolute: 0 cells/uL (ref 0–200)
Basophils Relative: 0 %
Eosinophils Absolute: 0 cells/uL — ABNORMAL LOW (ref 15–500)
Eosinophils Relative: 0 %
HCT: 26.4 % — ABNORMAL LOW (ref 38.5–50.0)
Hemoglobin: 8.4 g/dL — ABNORMAL LOW (ref 13.2–17.1)
Lymphs Abs: 212 cells/uL — ABNORMAL LOW (ref 850–3900)
MCH: 30.1 pg (ref 27.0–33.0)
MCHC: 31.8 g/dL — ABNORMAL LOW (ref 32.0–36.0)
MCV: 94.6 fL (ref 80.0–100.0)
MPV: 12.5 fL (ref 7.5–12.5)
Monocytes Relative: 11.5 %
Neutro Abs: 231 cells/uL — CL (ref 1500–7800)
Neutrophils Relative %: 46.2 %
Platelets: 57 10*3/uL — ABNORMAL LOW (ref 140–400)
RBC: 2.79 10*6/uL — ABNORMAL LOW (ref 4.20–5.80)
RDW: 17.6 % — ABNORMAL HIGH (ref 11.0–15.0)
Total Lymphocyte: 42.3 %
WBC: 0.5 10*3/uL — ABNORMAL LOW (ref 3.8–10.8)

## 2018-11-17 LAB — BASIC METABOLIC PANEL WITH GFR
BUN/Creatinine Ratio: 16 (calc) (ref 6–22)
BUN: 73 mg/dL — ABNORMAL HIGH (ref 7–25)
CO2: 22 mmol/L (ref 20–32)
Calcium: 7.2 mg/dL — ABNORMAL LOW (ref 8.6–10.3)
Chloride: 107 mmol/L (ref 98–110)
Creat: 4.49 mg/dL — ABNORMAL HIGH (ref 0.70–1.18)
GFR, Est African American: 14 mL/min/{1.73_m2} — ABNORMAL LOW (ref >=60)
GFR, Est Non African American: 12 mL/min/{1.73_m2} — ABNORMAL LOW (ref >=60)
Glucose, Bld: 221 mg/dL — ABNORMAL HIGH (ref 65–99)
Potassium: 4.9 mmol/L (ref 3.5–5.3)
Sodium: 140 mmol/L (ref 135–146)

## 2018-11-18 ENCOUNTER — Encounter: Payer: Self-pay | Admitting: Internal Medicine

## 2018-11-18 ENCOUNTER — Other Ambulatory Visit: Payer: Self-pay

## 2018-11-18 ENCOUNTER — Telehealth (INDEPENDENT_AMBULATORY_CARE_PROVIDER_SITE_OTHER): Payer: Medicare HMO | Admitting: Internal Medicine

## 2018-11-18 VITALS — BP 122/64 | HR 89 | Ht 70.0 in | Wt 181.0 lb

## 2018-11-18 DIAGNOSIS — N059 Unspecified nephritic syndrome with unspecified morphologic changes: Secondary | ICD-10-CM

## 2018-11-18 DIAGNOSIS — E782 Mixed hyperlipidemia: Secondary | ICD-10-CM

## 2018-11-18 DIAGNOSIS — I2510 Atherosclerotic heart disease of native coronary artery without angina pectoris: Secondary | ICD-10-CM

## 2018-11-18 DIAGNOSIS — I10 Essential (primary) hypertension: Secondary | ICD-10-CM

## 2018-11-18 DIAGNOSIS — I82401 Acute embolism and thrombosis of unspecified deep veins of right lower extremity: Secondary | ICD-10-CM

## 2018-11-18 NOTE — Patient Instructions (Addendum)
Medication Instructions:  No changes If you need a refill on your cardiac medications before your next appointment, please call your pharmacy.   Lab work: none If you have labs (blood work) drawn today and your tests are completely normal, you will receive your results only by: Marland Kitchen MyChart Message (if you have MyChart) OR . A paper copy in the mail If you have any lab test that is abnormal or we need to change your treatment, we will call you to review the results.  Testing/Procedures: none  Follow-Up:Any Other Special Instructions Will Be Listed Below (If Applicable).  We have scheduled follow up with Dr. Harrington Challenger (VIRTUAL --PHONE VISIT) on 02/20/19 at 9:40 am. Spoke with patient and informed of appointment.
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
## 2018-11-19 DIAGNOSIS — N179 Acute kidney failure, unspecified: Secondary | ICD-10-CM | POA: Diagnosis not present

## 2018-11-19 DIAGNOSIS — N2581 Secondary hyperparathyroidism of renal origin: Secondary | ICD-10-CM | POA: Diagnosis not present

## 2018-11-19 DIAGNOSIS — B189 Chronic viral hepatitis, unspecified: Secondary | ICD-10-CM | POA: Insufficient documentation

## 2018-11-20 DIAGNOSIS — N179 Acute kidney failure, unspecified: Secondary | ICD-10-CM | POA: Diagnosis not present

## 2018-11-22 ENCOUNTER — Telehealth: Payer: Self-pay | Admitting: Hematology and Oncology

## 2018-11-22 ENCOUNTER — Other Ambulatory Visit: Payer: Self-pay | Admitting: Family Medicine

## 2018-11-22 DIAGNOSIS — N179 Acute kidney failure, unspecified: Secondary | ICD-10-CM | POA: Diagnosis not present

## 2018-11-22 DIAGNOSIS — N2581 Secondary hyperparathyroidism of renal origin: Secondary | ICD-10-CM | POA: Diagnosis not present

## 2018-11-22 MED ORDER — OXYCODONE-ACETAMINOPHEN 10-325 MG PO TABS: 1.0000 | ORAL_TABLET | ORAL | 0 refills | Status: DC | PRN

## 2018-11-22 NOTE — Telephone Encounter (Signed)
Patient requesting a refill on Oxycodone     LOV: 11/14/18  LRF:   10/21/18
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
## 2018-11-22 NOTE — Telephone Encounter (Signed)
Received a vm from Susan Moore at [place] to schedule an urgent appt for [name]. I returned Shannon's call and scheduled [name] to see Dr. Lindi Adie tomorrow 5/27 at [place] will notify the pt of the appt date and time.

## 2018-11-23 ENCOUNTER — Inpatient Hospital Stay: Payer: Medicare HMO

## 2018-11-23 ENCOUNTER — Other Ambulatory Visit: Payer: Self-pay

## 2018-11-23 ENCOUNTER — Inpatient Hospital Stay: Payer: Medicare HMO | Attending: Hematology and Oncology | Admitting: Hematology and Oncology

## 2018-11-23 ENCOUNTER — Inpatient Hospital Stay: Payer: Medicare HMO | Admitting: Nurse Practitioner

## 2018-11-23 VITALS — BP 143/67 | HR 71 | Temp 98.7°F | Resp 18 | Ht 70.0 in | Wt 186.0 lb

## 2018-11-23 DIAGNOSIS — Z8249 Family history of ischemic heart disease and other diseases of the circulatory system: Secondary | ICD-10-CM | POA: Insufficient documentation

## 2018-11-23 DIAGNOSIS — D631 Anemia in chronic kidney disease: Secondary | ICD-10-CM

## 2018-11-23 DIAGNOSIS — E1151 Type 2 diabetes mellitus with diabetic peripheral angiopathy without gangrene: Secondary | ICD-10-CM | POA: Insufficient documentation

## 2018-11-23 DIAGNOSIS — Z87891 Personal history of nicotine dependence: Secondary | ICD-10-CM | POA: Insufficient documentation

## 2018-11-23 DIAGNOSIS — D61818 Other pancytopenia: Secondary | ICD-10-CM

## 2018-11-23 DIAGNOSIS — Z79899 Other long term (current) drug therapy: Secondary | ICD-10-CM | POA: Insufficient documentation

## 2018-11-23 DIAGNOSIS — N186 End stage renal disease: Secondary | ICD-10-CM

## 2018-11-23 DIAGNOSIS — Z7901 Long term (current) use of anticoagulants: Secondary | ICD-10-CM | POA: Diagnosis not present

## 2018-11-23 DIAGNOSIS — D649 Anemia, unspecified: Secondary | ICD-10-CM

## 2018-11-23 DIAGNOSIS — E1122 Type 2 diabetes mellitus with diabetic chronic kidney disease: Secondary | ICD-10-CM | POA: Diagnosis not present

## 2018-11-23 DIAGNOSIS — E785 Hyperlipidemia, unspecified: Secondary | ICD-10-CM | POA: Diagnosis not present

## 2018-11-23 DIAGNOSIS — Z794 Long term (current) use of insulin: Secondary | ICD-10-CM | POA: Insufficient documentation

## 2018-11-23 DIAGNOSIS — I120 Hypertensive chronic kidney disease with stage 5 chronic kidney disease or end stage renal disease: Secondary | ICD-10-CM | POA: Diagnosis not present

## 2018-11-23 DIAGNOSIS — Z7951 Long term (current) use of inhaled steroids: Secondary | ICD-10-CM | POA: Diagnosis not present

## 2018-11-23 DIAGNOSIS — Z992 Dependence on renal dialysis: Secondary | ICD-10-CM | POA: Insufficient documentation

## 2018-11-23 DIAGNOSIS — Z86718 Personal history of other venous thrombosis and embolism: Secondary | ICD-10-CM | POA: Diagnosis not present

## 2018-11-23 LAB — CBC WITH DIFFERENTIAL (CANCER CENTER ONLY)
Abs Immature Granulocytes: 0.07 10*3/uL (ref 0.00–0.07)
Basophils Absolute: 0 10*3/uL (ref 0.0–0.1)
Basophils Relative: 0 %
Eosinophils Absolute: 0 10*3/uL (ref 0.0–0.5)
Eosinophils Relative: 0 %
HCT: 26.6 % — ABNORMAL LOW (ref 39.0–52.0)
Hemoglobin: 8.2 g/dL — ABNORMAL LOW (ref 13.0–17.0)
Immature Granulocytes: 1 %
Lymphocytes Relative: 20 %
Lymphs Abs: 1.1 10*3/uL (ref 0.7–4.0)
MCH: 30.4 pg (ref 26.0–34.0)
MCHC: 30.8 g/dL (ref 30.0–36.0)
MCV: 98.5 fL (ref 80.0–100.0)
Monocytes Absolute: 0.3 10*3/uL (ref 0.1–1.0)
Monocytes Relative: 6 %
Neutro Abs: 3.9 10*3/uL (ref 1.7–7.7)
Neutrophils Relative %: 73 %
Platelet Count: 115 10*3/uL — ABNORMAL LOW (ref 150–400)
RBC: 2.7 MIL/uL — ABNORMAL LOW (ref 4.22–5.81)
RDW: 18.6 % — ABNORMAL HIGH (ref 11.5–15.5)
WBC Count: 5.4 10*3/uL (ref 4.0–10.5)
nRBC: 0 % (ref 0.0–0.2)

## 2018-11-23 LAB — CMP (CANCER CENTER ONLY)
ALT: 24 U/L (ref 0–44)
AST: 22 U/L (ref 15–41)
Albumin: 2.2 g/dL — ABNORMAL LOW (ref 3.5–5.0)
Alkaline Phosphatase: 65 U/L (ref 38–126)
Anion gap: 7 (ref 5–15)
BUN: 41 mg/dL — ABNORMAL HIGH (ref 8–23)
CO2: 28 mmol/L (ref 22–32)
Calcium: 7.3 mg/dL — ABNORMAL LOW (ref 8.9–10.3)
Chloride: 103 mmol/L (ref 98–111)
Creatinine: 3.25 mg/dL (ref 0.61–1.24)
GFR, Est AFR Am: 21 mL/min — ABNORMAL LOW (ref >60)
GFR, Estimated: 18 mL/min — ABNORMAL LOW (ref >60)
Glucose, Bld: 79 mg/dL (ref 70–99)
Potassium: 3.8 mmol/L (ref 3.5–5.1)
Sodium: 138 mmol/L (ref 135–145)
Total Bilirubin: 0.6 mg/dL (ref 0.3–1.2)
Total Protein: 5.3 g/dL — ABNORMAL LOW (ref 6.5–8.1)

## 2018-11-23 LAB — IRON AND TIBC
Iron: 79 ug/dL (ref 42–163)
Saturation Ratios: 35 % (ref 20–55)
TIBC: 226 ug/dL (ref 202–409)
UIBC: 147 ug/dL (ref 117–376)

## 2018-11-23 LAB — RETIC PANEL
Immature Retic Fract: 13.7 % (ref 2.3–15.9)
RBC.: 2.7 MIL/uL — ABNORMAL LOW (ref 4.22–5.81)
Retic Count, Absolute: 79.1 10*3/uL (ref 19.0–186.0)
Retic Ct Pct: 2.9 % (ref 0.4–3.1)
Reticulocyte Hemoglobin: 35 pg (ref >27.9)

## 2018-11-23 LAB — PLATELET BY CITRATE

## 2018-11-23 LAB — FERRITIN: Ferritin: 686 ng/mL — ABNORMAL HIGH (ref 24–336)

## 2018-11-23 LAB — VITAMIN B12: Vitamin B-12: 429 pg/mL (ref 180–914)

## 2018-11-23 LAB — FOLATE: Folate: 5.3 ng/mL — ABNORMAL LOW (ref >5.9)

## 2018-11-23 LAB — LACTATE DEHYDROGENASE: LDH: 263 U/L — ABNORMAL HIGH (ref 98–192)

## 2018-11-23 NOTE — Assessment & Plan Note (Addendum)
Hospitalization 11/04/2018-11/09/2018: Severe anemia hemoglobin 6.4 requiring transfusion, EGD 11/07/2018: Gastritis and angiodysplasia in duodenum Pulmonary consulted for hemoptysis:?  Vasculitis Hospitalization April 2020: DVT on Eliquis Other comorbidities: ESRD on hemodialysis, bladder cancer, CAD, type 2 diabetes, PAD, hypertension, DVT Anemia of chronic kidney disease: Receives Aranesp with hemodialysis Recent treatment: 11/02/2018 rituximab for the glomerulonephritis  Lab review 11/14/2018: WBC 0.5, hemoglobin 8.4, platelets 57, ANC 231  Differential diagnosis: 1.  Rituximab induced bone marrow suppression 2.  MDS 3.  Other causes of anemia including hemolysis S50 folic acid deficiencies accompanied by bleeding  We will check and repeat CBC with differential, N39, folic acid, erythropoietin, ANA, LDH, CMP Plan: If persistent pancytopenia then we will perform bone marrow biopsy. Since he is afebrile, we can decide to hold off on growth factor injections at this time.

## 2018-11-24 ENCOUNTER — Telehealth: Payer: Self-pay | Admitting: Family Medicine

## 2018-11-24 DIAGNOSIS — N179 Acute kidney failure, unspecified: Secondary | ICD-10-CM | POA: Diagnosis not present

## 2018-11-24 DIAGNOSIS — N2581 Secondary hyperparathyroidism of renal origin: Secondary | ICD-10-CM | POA: Diagnosis not present

## 2018-11-24 LAB — ANTINUCLEAR ANTIBODIES, IFA: ANA Ab, IFA: NEGATIVE

## 2018-11-24 LAB — ERYTHROPOIETIN: Erythropoietin: 1262 m[IU]/mL — ABNORMAL HIGH (ref 2.6–18.5)

## 2018-11-24 NOTE — Telephone Encounter (Signed)
[name] called and wanted to know if we could set the pt up with some kind of therapy for his legs? States that he can hardly lift his legs.

## 2018-11-25 ENCOUNTER — Other Ambulatory Visit: Payer: Self-pay | Admitting: Family Medicine

## 2018-11-25 DIAGNOSIS — R29898 Other symptoms and signs involving the musculoskeletal system: Secondary | ICD-10-CM

## 2018-11-25 NOTE — Telephone Encounter (Signed)
I will order PT.

## 2018-11-26 DIAGNOSIS — N2581 Secondary hyperparathyroidism of renal origin: Secondary | ICD-10-CM | POA: Diagnosis not present

## 2018-11-26 DIAGNOSIS — N179 Acute kidney failure, unspecified: Secondary | ICD-10-CM | POA: Diagnosis not present

## 2018-11-28 NOTE — Telephone Encounter (Signed)
[name] aware of referral

## 2018-11-29 DIAGNOSIS — N2581 Secondary hyperparathyroidism of renal origin: Secondary | ICD-10-CM | POA: Diagnosis not present

## 2018-11-29 DIAGNOSIS — N179 Acute kidney failure, unspecified: Secondary | ICD-10-CM | POA: Diagnosis not present

## 2018-11-30 DIAGNOSIS — D509 Iron deficiency anemia, unspecified: Secondary | ICD-10-CM | POA: Diagnosis not present

## 2018-11-30 DIAGNOSIS — K31819 Angiodysplasia of stomach and duodenum without bleeding: Secondary | ICD-10-CM | POA: Diagnosis not present

## 2018-11-30 DIAGNOSIS — K2970 Gastritis, unspecified, without bleeding: Secondary | ICD-10-CM | POA: Diagnosis not present

## 2018-12-01 ENCOUNTER — Other Ambulatory Visit: Payer: Self-pay | Admitting: Family Medicine

## 2018-12-01 DIAGNOSIS — N179 Acute kidney failure, unspecified: Secondary | ICD-10-CM | POA: Diagnosis not present

## 2018-12-01 DIAGNOSIS — N2581 Secondary hyperparathyroidism of renal origin: Secondary | ICD-10-CM | POA: Diagnosis not present

## 2018-12-01 MED ORDER — NICOTINE 21 MG/24HR TD PT24: 21.0000 mg | MEDICATED_PATCH | Freq: Every day | TRANSDERMAL | 2 refills | Status: DC

## 2018-12-03 DIAGNOSIS — N179 Acute kidney failure, unspecified: Secondary | ICD-10-CM | POA: Diagnosis not present

## 2018-12-03 DIAGNOSIS — N2581 Secondary hyperparathyroidism of renal origin: Secondary | ICD-10-CM | POA: Diagnosis not present

## 2018-12-05 ENCOUNTER — Other Ambulatory Visit: Payer: Self-pay | Admitting: Family Medicine

## 2018-12-05 DIAGNOSIS — N179 Acute kidney failure, unspecified: Secondary | ICD-10-CM | POA: Diagnosis not present

## 2018-12-05 DIAGNOSIS — I129 Hypertensive chronic kidney disease with stage 1 through stage 4 chronic kidney disease, or unspecified chronic kidney disease: Secondary | ICD-10-CM | POA: Diagnosis not present

## 2018-12-05 DIAGNOSIS — D61818 Other pancytopenia: Secondary | ICD-10-CM | POA: Diagnosis not present

## 2018-12-05 DIAGNOSIS — D631 Anemia in chronic kidney disease: Secondary | ICD-10-CM | POA: Diagnosis not present

## 2018-12-05 DIAGNOSIS — N057 Unspecified nephritic syndrome with diffuse crescentic glomerulonephritis: Secondary | ICD-10-CM | POA: Diagnosis not present

## 2018-12-05 DIAGNOSIS — E875 Hyperkalemia: Secondary | ICD-10-CM | POA: Diagnosis not present

## 2018-12-05 DIAGNOSIS — N058 Unspecified nephritic syndrome with other morphologic changes: Secondary | ICD-10-CM | POA: Diagnosis not present

## 2018-12-05 DIAGNOSIS — R768 Other specified abnormal immunological findings in serum: Secondary | ICD-10-CM | POA: Diagnosis not present

## 2018-12-05 MED ORDER — BASAGLAR KWIKPEN 100 UNIT/ML ~~LOC~~ SOPN: 7.0000 [IU] | PEN_INJECTOR | Freq: Every day | SUBCUTANEOUS | 3 refills | Status: DC

## 2018-12-06 ENCOUNTER — Other Ambulatory Visit: Payer: Self-pay | Admitting: Family Medicine

## 2018-12-06 DIAGNOSIS — N2581 Secondary hyperparathyroidism of renal origin: Secondary | ICD-10-CM | POA: Diagnosis not present

## 2018-12-06 DIAGNOSIS — N179 Acute kidney failure, unspecified: Secondary | ICD-10-CM | POA: Diagnosis not present

## 2018-12-06 DIAGNOSIS — R29898 Other symptoms and signs involving the musculoskeletal system: Secondary | ICD-10-CM

## 2018-12-08 ENCOUNTER — Other Ambulatory Visit: Payer: Self-pay | Admitting: *Deleted

## 2018-12-08 DIAGNOSIS — N186 End stage renal disease: Secondary | ICD-10-CM | POA: Diagnosis not present

## 2018-12-08 DIAGNOSIS — N2581 Secondary hyperparathyroidism of renal origin: Secondary | ICD-10-CM | POA: Diagnosis not present

## 2018-12-08 MED ORDER — TRESIBA FLEXTOUCH 100 UNIT/ML ~~LOC~~ SOPN: 7.0000 [IU] | PEN_INJECTOR | Freq: Every day | SUBCUTANEOUS | 3 refills | Status: DC

## 2018-12-10 DIAGNOSIS — N2581 Secondary hyperparathyroidism of renal origin: Secondary | ICD-10-CM | POA: Diagnosis not present

## 2018-12-10 DIAGNOSIS — N186 End stage renal disease: Secondary | ICD-10-CM | POA: Diagnosis not present

## 2018-12-12 DIAGNOSIS — N189 Chronic kidney disease, unspecified: Secondary | ICD-10-CM | POA: Insufficient documentation

## 2018-12-13 DIAGNOSIS — N2581 Secondary hyperparathyroidism of renal origin: Secondary | ICD-10-CM | POA: Diagnosis not present

## 2018-12-13 DIAGNOSIS — N186 End stage renal disease: Secondary | ICD-10-CM | POA: Diagnosis not present

## 2018-12-15 DIAGNOSIS — N2581 Secondary hyperparathyroidism of renal origin: Secondary | ICD-10-CM | POA: Diagnosis not present

## 2018-12-15 DIAGNOSIS — N186 End stage renal disease: Secondary | ICD-10-CM | POA: Diagnosis not present

## 2018-12-17 DIAGNOSIS — N186 End stage renal disease: Secondary | ICD-10-CM | POA: Diagnosis not present

## 2018-12-17 DIAGNOSIS — N2581 Secondary hyperparathyroidism of renal origin: Secondary | ICD-10-CM | POA: Diagnosis not present

## 2018-12-19 DIAGNOSIS — R760 Raised antibody titer: Secondary | ICD-10-CM | POA: Insufficient documentation

## 2018-12-20 DIAGNOSIS — N186 End stage renal disease: Secondary | ICD-10-CM | POA: Diagnosis not present

## 2018-12-20 DIAGNOSIS — N2581 Secondary hyperparathyroidism of renal origin: Secondary | ICD-10-CM | POA: Diagnosis not present

## 2018-12-22 DIAGNOSIS — N2581 Secondary hyperparathyroidism of renal origin: Secondary | ICD-10-CM | POA: Diagnosis not present

## 2018-12-22 DIAGNOSIS — N186 End stage renal disease: Secondary | ICD-10-CM | POA: Diagnosis not present

## 2018-12-24 DIAGNOSIS — N2581 Secondary hyperparathyroidism of renal origin: Secondary | ICD-10-CM | POA: Diagnosis not present

## 2018-12-24 DIAGNOSIS — N186 End stage renal disease: Secondary | ICD-10-CM | POA: Diagnosis not present

## 2018-12-26 DIAGNOSIS — E1151 Type 2 diabetes mellitus with diabetic peripheral angiopathy without gangrene: Secondary | ICD-10-CM | POA: Diagnosis not present

## 2018-12-26 DIAGNOSIS — I2510 Atherosclerotic heart disease of native coronary artery without angina pectoris: Secondary | ICD-10-CM | POA: Diagnosis not present

## 2018-12-26 DIAGNOSIS — I129 Hypertensive chronic kidney disease with stage 1 through stage 4 chronic kidney disease, or unspecified chronic kidney disease: Secondary | ICD-10-CM | POA: Diagnosis not present

## 2018-12-26 DIAGNOSIS — J449 Chronic obstructive pulmonary disease, unspecified: Secondary | ICD-10-CM | POA: Diagnosis not present

## 2018-12-26 DIAGNOSIS — E1142 Type 2 diabetes mellitus with diabetic polyneuropathy: Secondary | ICD-10-CM | POA: Diagnosis not present

## 2018-12-26 DIAGNOSIS — N186 End stage renal disease: Secondary | ICD-10-CM | POA: Diagnosis not present

## 2018-12-26 DIAGNOSIS — I82409 Acute embolism and thrombosis of unspecified deep veins of unspecified lower extremity: Secondary | ICD-10-CM | POA: Diagnosis not present

## 2018-12-26 DIAGNOSIS — J849 Interstitial pulmonary disease, unspecified: Secondary | ICD-10-CM | POA: Diagnosis not present

## 2018-12-26 DIAGNOSIS — E1122 Type 2 diabetes mellitus with diabetic chronic kidney disease: Secondary | ICD-10-CM | POA: Diagnosis not present

## 2018-12-27 DIAGNOSIS — E1122 Type 2 diabetes mellitus with diabetic chronic kidney disease: Secondary | ICD-10-CM | POA: Diagnosis not present

## 2018-12-27 DIAGNOSIS — N2581 Secondary hyperparathyroidism of renal origin: Secondary | ICD-10-CM | POA: Diagnosis not present

## 2018-12-27 DIAGNOSIS — N186 End stage renal disease: Secondary | ICD-10-CM | POA: Diagnosis not present

## 2018-12-27 DIAGNOSIS — Z992 Dependence on renal dialysis: Secondary | ICD-10-CM | POA: Diagnosis not present

## 2018-12-28 ENCOUNTER — Other Ambulatory Visit: Payer: Self-pay

## 2018-12-28 ENCOUNTER — Other Ambulatory Visit: Payer: Self-pay | Admitting: Family Medicine

## 2018-12-28 ENCOUNTER — Ambulatory Visit (INDEPENDENT_AMBULATORY_CARE_PROVIDER_SITE_OTHER): Payer: Medicare HMO | Admitting: Podiatry

## 2018-12-28 ENCOUNTER — Encounter: Payer: Self-pay | Admitting: Podiatry

## 2018-12-28 VITALS — Temp 97.2°F

## 2018-12-28 DIAGNOSIS — R600 Localized edema: Secondary | ICD-10-CM

## 2018-12-28 DIAGNOSIS — B351 Tinea unguium: Secondary | ICD-10-CM | POA: Diagnosis not present

## 2018-12-28 DIAGNOSIS — M79674 Pain in right toe(s): Secondary | ICD-10-CM

## 2018-12-28 DIAGNOSIS — M79675 Pain in left toe(s): Secondary | ICD-10-CM

## 2018-12-28 MED ORDER — OXYCODONE-ACETAMINOPHEN 10-325 MG PO TABS: 1.0000 | ORAL_TABLET | ORAL | 0 refills | Status: DC | PRN

## 2018-12-28 NOTE — Telephone Encounter (Signed)
Patient requesting a refill on Oxycodone     LOV:  11/14/18  LRF:       11/22/18

## 2018-12-29 DIAGNOSIS — N2581 Secondary hyperparathyroidism of renal origin: Secondary | ICD-10-CM | POA: Diagnosis not present

## 2018-12-29 DIAGNOSIS — N186 End stage renal disease: Secondary | ICD-10-CM | POA: Diagnosis not present

## 2018-12-29 NOTE — Progress Notes (Signed)
Subjective:   Patient ID: [name]., male   DOB: 73 y.o.   MRN: [id number]   HPI Patient presents in very poor health who is on dialysis and has severe nail disease 1-5 both feet that make walking difficult and states that he has just generalized pain in his feet with any forms of activity.  Patient does not smoke and is not active   Review of Systems  All other systems reviewed and are negative.       Objective:  Physical Exam Vitals signs and nursing note reviewed.  Constitutional:      Appearance: He is well-developed.  Pulmonary:     Effort: Pulmonary effort is normal.  Musculoskeletal: Normal range of motion.  Skin:    General: Skin is warm.  Neurological:     Mental Status: He is alert.     Vascular status found to be diminished bilateral with diminishment of sharp dull vibratory.  Patient has moderate swelling in his feet with negative Homans sign and appears to be more due to venous insufficiency and possibility of cardiac or kidney issues.  He has severe ingrown toenails 1-5 both feet that are thickened abnormally shaped and painful.  Patient does have good digital perfusion and is well oriented      Assessment:  Poor health individual with severe nail disease 1-5 both feet that are incurvated in the corners and painful with palpation     Plan:  H&P condition reviewed at today I debrided nailbeds 1-5 both feet with no iatrogenic bleeding and this will be done on routine basis by this office.  If any other symptoms were to occur he is to let us know right away

## 2018-12-31 DIAGNOSIS — N2581 Secondary hyperparathyroidism of renal origin: Secondary | ICD-10-CM | POA: Diagnosis not present

## 2018-12-31 DIAGNOSIS — N186 End stage renal disease: Secondary | ICD-10-CM | POA: Diagnosis not present

## 2019-01-03 DIAGNOSIS — N2581 Secondary hyperparathyroidism of renal origin: Secondary | ICD-10-CM | POA: Diagnosis not present

## 2019-01-03 DIAGNOSIS — N186 End stage renal disease: Secondary | ICD-10-CM | POA: Diagnosis not present

## 2019-01-05 DIAGNOSIS — E1142 Type 2 diabetes mellitus with diabetic polyneuropathy: Secondary | ICD-10-CM | POA: Diagnosis not present

## 2019-01-05 DIAGNOSIS — I82409 Acute embolism and thrombosis of unspecified deep veins of unspecified lower extremity: Secondary | ICD-10-CM | POA: Diagnosis not present

## 2019-01-05 DIAGNOSIS — E1151 Type 2 diabetes mellitus with diabetic peripheral angiopathy without gangrene: Secondary | ICD-10-CM | POA: Diagnosis not present

## 2019-01-05 DIAGNOSIS — I2510 Atherosclerotic heart disease of native coronary artery without angina pectoris: Secondary | ICD-10-CM | POA: Diagnosis not present

## 2019-01-05 DIAGNOSIS — J849 Interstitial pulmonary disease, unspecified: Secondary | ICD-10-CM | POA: Diagnosis not present

## 2019-01-05 DIAGNOSIS — J449 Chronic obstructive pulmonary disease, unspecified: Secondary | ICD-10-CM | POA: Diagnosis not present

## 2019-01-05 DIAGNOSIS — I129 Hypertensive chronic kidney disease with stage 1 through stage 4 chronic kidney disease, or unspecified chronic kidney disease: Secondary | ICD-10-CM | POA: Diagnosis not present

## 2019-01-05 DIAGNOSIS — N186 End stage renal disease: Secondary | ICD-10-CM | POA: Diagnosis not present

## 2019-01-05 DIAGNOSIS — N2581 Secondary hyperparathyroidism of renal origin: Secondary | ICD-10-CM | POA: Diagnosis not present

## 2019-01-05 DIAGNOSIS — E1122 Type 2 diabetes mellitus with diabetic chronic kidney disease: Secondary | ICD-10-CM | POA: Diagnosis not present

## 2019-01-06 DIAGNOSIS — I2510 Atherosclerotic heart disease of native coronary artery without angina pectoris: Secondary | ICD-10-CM | POA: Diagnosis not present

## 2019-01-06 DIAGNOSIS — E1142 Type 2 diabetes mellitus with diabetic polyneuropathy: Secondary | ICD-10-CM | POA: Diagnosis not present

## 2019-01-06 DIAGNOSIS — I129 Hypertensive chronic kidney disease with stage 1 through stage 4 chronic kidney disease, or unspecified chronic kidney disease: Secondary | ICD-10-CM | POA: Diagnosis not present

## 2019-01-06 DIAGNOSIS — E1122 Type 2 diabetes mellitus with diabetic chronic kidney disease: Secondary | ICD-10-CM | POA: Diagnosis not present

## 2019-01-06 DIAGNOSIS — J849 Interstitial pulmonary disease, unspecified: Secondary | ICD-10-CM | POA: Diagnosis not present

## 2019-01-06 DIAGNOSIS — J449 Chronic obstructive pulmonary disease, unspecified: Secondary | ICD-10-CM | POA: Diagnosis not present

## 2019-01-06 DIAGNOSIS — N186 End stage renal disease: Secondary | ICD-10-CM | POA: Diagnosis not present

## 2019-01-06 DIAGNOSIS — I82409 Acute embolism and thrombosis of unspecified deep veins of unspecified lower extremity: Secondary | ICD-10-CM | POA: Diagnosis not present

## 2019-01-06 DIAGNOSIS — E1151 Type 2 diabetes mellitus with diabetic peripheral angiopathy without gangrene: Secondary | ICD-10-CM | POA: Diagnosis not present

## 2019-01-07 DIAGNOSIS — N2581 Secondary hyperparathyroidism of renal origin: Secondary | ICD-10-CM | POA: Diagnosis not present

## 2019-01-07 DIAGNOSIS — N186 End stage renal disease: Secondary | ICD-10-CM | POA: Diagnosis not present

## 2019-01-09 DIAGNOSIS — I129 Hypertensive chronic kidney disease with stage 1 through stage 4 chronic kidney disease, or unspecified chronic kidney disease: Secondary | ICD-10-CM | POA: Diagnosis not present

## 2019-01-09 DIAGNOSIS — E1151 Type 2 diabetes mellitus with diabetic peripheral angiopathy without gangrene: Secondary | ICD-10-CM | POA: Diagnosis not present

## 2019-01-09 DIAGNOSIS — E1122 Type 2 diabetes mellitus with diabetic chronic kidney disease: Secondary | ICD-10-CM | POA: Diagnosis not present

## 2019-01-09 DIAGNOSIS — J849 Interstitial pulmonary disease, unspecified: Secondary | ICD-10-CM | POA: Diagnosis not present

## 2019-01-09 DIAGNOSIS — J449 Chronic obstructive pulmonary disease, unspecified: Secondary | ICD-10-CM | POA: Diagnosis not present

## 2019-01-09 DIAGNOSIS — E1142 Type 2 diabetes mellitus with diabetic polyneuropathy: Secondary | ICD-10-CM | POA: Diagnosis not present

## 2019-01-09 DIAGNOSIS — I82409 Acute embolism and thrombosis of unspecified deep veins of unspecified lower extremity: Secondary | ICD-10-CM | POA: Diagnosis not present

## 2019-01-09 DIAGNOSIS — N186 End stage renal disease: Secondary | ICD-10-CM | POA: Diagnosis not present

## 2019-01-09 DIAGNOSIS — I2510 Atherosclerotic heart disease of native coronary artery without angina pectoris: Secondary | ICD-10-CM | POA: Diagnosis not present

## 2019-01-10 DIAGNOSIS — N2581 Secondary hyperparathyroidism of renal origin: Secondary | ICD-10-CM | POA: Diagnosis not present

## 2019-01-10 DIAGNOSIS — N186 End stage renal disease: Secondary | ICD-10-CM | POA: Diagnosis not present

## 2019-01-11 DIAGNOSIS — Z86010 Personal history of colonic polyps: Secondary | ICD-10-CM | POA: Diagnosis not present

## 2019-01-11 DIAGNOSIS — D509 Iron deficiency anemia, unspecified: Secondary | ICD-10-CM | POA: Diagnosis not present

## 2019-01-12 ENCOUNTER — Other Ambulatory Visit: Payer: Self-pay

## 2019-01-12 DIAGNOSIS — N186 End stage renal disease: Secondary | ICD-10-CM | POA: Diagnosis not present

## 2019-01-12 DIAGNOSIS — N2581 Secondary hyperparathyroidism of renal origin: Secondary | ICD-10-CM | POA: Diagnosis not present

## 2019-01-12 DIAGNOSIS — Z992 Dependence on renal dialysis: Secondary | ICD-10-CM

## 2019-01-12 DIAGNOSIS — I739 Peripheral vascular disease, unspecified: Secondary | ICD-10-CM

## 2019-01-13 DIAGNOSIS — E1142 Type 2 diabetes mellitus with diabetic polyneuropathy: Secondary | ICD-10-CM | POA: Diagnosis not present

## 2019-01-13 DIAGNOSIS — J849 Interstitial pulmonary disease, unspecified: Secondary | ICD-10-CM | POA: Diagnosis not present

## 2019-01-13 DIAGNOSIS — J449 Chronic obstructive pulmonary disease, unspecified: Secondary | ICD-10-CM | POA: Diagnosis not present

## 2019-01-13 DIAGNOSIS — E1122 Type 2 diabetes mellitus with diabetic chronic kidney disease: Secondary | ICD-10-CM | POA: Diagnosis not present

## 2019-01-13 DIAGNOSIS — N186 End stage renal disease: Secondary | ICD-10-CM | POA: Diagnosis not present

## 2019-01-13 DIAGNOSIS — I129 Hypertensive chronic kidney disease with stage 1 through stage 4 chronic kidney disease, or unspecified chronic kidney disease: Secondary | ICD-10-CM | POA: Diagnosis not present

## 2019-01-13 DIAGNOSIS — I2510 Atherosclerotic heart disease of native coronary artery without angina pectoris: Secondary | ICD-10-CM | POA: Diagnosis not present

## 2019-01-13 DIAGNOSIS — I82409 Acute embolism and thrombosis of unspecified deep veins of unspecified lower extremity: Secondary | ICD-10-CM | POA: Diagnosis not present

## 2019-01-13 DIAGNOSIS — E1151 Type 2 diabetes mellitus with diabetic peripheral angiopathy without gangrene: Secondary | ICD-10-CM | POA: Diagnosis not present

## 2019-01-14 DIAGNOSIS — N186 End stage renal disease: Secondary | ICD-10-CM | POA: Diagnosis not present

## 2019-01-14 DIAGNOSIS — N2581 Secondary hyperparathyroidism of renal origin: Secondary | ICD-10-CM | POA: Diagnosis not present

## 2019-01-16 DIAGNOSIS — J849 Interstitial pulmonary disease, unspecified: Secondary | ICD-10-CM | POA: Diagnosis not present

## 2019-01-16 DIAGNOSIS — N186 End stage renal disease: Secondary | ICD-10-CM | POA: Diagnosis not present

## 2019-01-16 DIAGNOSIS — E1142 Type 2 diabetes mellitus with diabetic polyneuropathy: Secondary | ICD-10-CM | POA: Diagnosis not present

## 2019-01-16 DIAGNOSIS — I129 Hypertensive chronic kidney disease with stage 1 through stage 4 chronic kidney disease, or unspecified chronic kidney disease: Secondary | ICD-10-CM | POA: Diagnosis not present

## 2019-01-16 DIAGNOSIS — I82409 Acute embolism and thrombosis of unspecified deep veins of unspecified lower extremity: Secondary | ICD-10-CM | POA: Diagnosis not present

## 2019-01-16 DIAGNOSIS — I2510 Atherosclerotic heart disease of native coronary artery without angina pectoris: Secondary | ICD-10-CM | POA: Diagnosis not present

## 2019-01-16 DIAGNOSIS — J449 Chronic obstructive pulmonary disease, unspecified: Secondary | ICD-10-CM | POA: Diagnosis not present

## 2019-01-16 DIAGNOSIS — E1122 Type 2 diabetes mellitus with diabetic chronic kidney disease: Secondary | ICD-10-CM | POA: Diagnosis not present

## 2019-01-16 DIAGNOSIS — E1151 Type 2 diabetes mellitus with diabetic peripheral angiopathy without gangrene: Secondary | ICD-10-CM | POA: Diagnosis not present

## 2019-01-17 DIAGNOSIS — N2581 Secondary hyperparathyroidism of renal origin: Secondary | ICD-10-CM | POA: Diagnosis not present

## 2019-01-17 DIAGNOSIS — N186 End stage renal disease: Secondary | ICD-10-CM | POA: Diagnosis not present

## 2019-01-18 ENCOUNTER — Ambulatory Visit (HOSPITAL_COMMUNITY)
Admission: RE | Admit: 2019-01-18 | Discharge: 2019-01-18 | Disposition: A | Discharge status: Home / Self Care | Payer: Medicare HMO | Source: Ambulatory Visit | Attending: Vascular Surgery | Admitting: Vascular Surgery

## 2019-01-18 ENCOUNTER — Encounter: Payer: Self-pay | Admitting: Vascular Surgery

## 2019-01-18 ENCOUNTER — Ambulatory Visit (INDEPENDENT_AMBULATORY_CARE_PROVIDER_SITE_OTHER): Payer: Medicare HMO | Admitting: Vascular Surgery

## 2019-01-18 ENCOUNTER — Encounter: Payer: Self-pay | Admitting: *Deleted

## 2019-01-18 ENCOUNTER — Ambulatory Visit (INDEPENDENT_AMBULATORY_CARE_PROVIDER_SITE_OTHER)
Admission: RE | Admit: 2019-01-18 | Discharge: 2019-01-18 | Disposition: A | Discharge status: Home / Self Care | Payer: Medicare HMO | Source: Ambulatory Visit | Attending: Vascular Surgery | Admitting: Vascular Surgery

## 2019-01-18 ENCOUNTER — Other Ambulatory Visit: Payer: Self-pay

## 2019-01-18 ENCOUNTER — Other Ambulatory Visit: Payer: Self-pay | Admitting: *Deleted

## 2019-01-18 VITALS — BP 144/77 | HR 73 | Temp 97.2°F | Resp 20 | Ht 70.0 in | Wt 177.3 lb

## 2019-01-18 DIAGNOSIS — Z992 Dependence on renal dialysis: Secondary | ICD-10-CM | POA: Diagnosis not present

## 2019-01-18 DIAGNOSIS — N186 End stage renal disease: Secondary | ICD-10-CM

## 2019-01-18 DIAGNOSIS — N184 Chronic kidney disease, stage 4 (severe): Secondary | ICD-10-CM
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
## 2019-01-19 DIAGNOSIS — N186 End stage renal disease: Secondary | ICD-10-CM | POA: Diagnosis not present

## 2019-01-19 DIAGNOSIS — N2581 Secondary hyperparathyroidism of renal origin: Secondary | ICD-10-CM | POA: Diagnosis not present

## 2019-01-21 DIAGNOSIS — N2581 Secondary hyperparathyroidism of renal origin: Secondary | ICD-10-CM | POA: Diagnosis not present

## 2019-01-21 DIAGNOSIS — N186 End stage renal disease: Secondary | ICD-10-CM | POA: Diagnosis not present

## 2019-01-24 DIAGNOSIS — N2581 Secondary hyperparathyroidism of renal origin: Secondary | ICD-10-CM | POA: Diagnosis not present

## 2019-01-24 DIAGNOSIS — N186 End stage renal disease: Secondary | ICD-10-CM | POA: Diagnosis not present

## 2019-01-26 ENCOUNTER — Encounter (HOSPITAL_COMMUNITY): Payer: Self-pay | Admitting: *Deleted

## 2019-01-26 DIAGNOSIS — N2581 Secondary hyperparathyroidism of renal origin: Secondary | ICD-10-CM | POA: Diagnosis not present

## 2019-01-26 DIAGNOSIS — N186 End stage renal disease: Secondary | ICD-10-CM | POA: Diagnosis not present

## 2019-01-27 ENCOUNTER — Other Ambulatory Visit: Payer: Self-pay

## 2019-01-27 ENCOUNTER — Other Ambulatory Visit (HOSPITAL_COMMUNITY)
Admission: RE | Admit: 2019-01-27 | Discharge: 2019-01-27 | Disposition: A | Discharge status: Home / Self Care | Payer: Medicare HMO | Source: Ambulatory Visit | Attending: Vascular Surgery | Admitting: Vascular Surgery

## 2019-01-27 ENCOUNTER — Encounter (HOSPITAL_COMMUNITY): Payer: Self-pay | Admitting: *Deleted

## 2019-01-27 DIAGNOSIS — Z20828 Contact with and (suspected) exposure to other viral communicable diseases: Secondary | ICD-10-CM | POA: Insufficient documentation

## 2019-01-27 DIAGNOSIS — J849 Interstitial pulmonary disease, unspecified: Secondary | ICD-10-CM | POA: Diagnosis not present

## 2019-01-27 DIAGNOSIS — E1122 Type 2 diabetes mellitus with diabetic chronic kidney disease: Secondary | ICD-10-CM | POA: Diagnosis not present

## 2019-01-27 DIAGNOSIS — J449 Chronic obstructive pulmonary disease, unspecified: Secondary | ICD-10-CM | POA: Diagnosis not present

## 2019-01-27 DIAGNOSIS — N186 End stage renal disease: Secondary | ICD-10-CM | POA: Diagnosis not present

## 2019-01-27 DIAGNOSIS — Z992 Dependence on renal dialysis: Secondary | ICD-10-CM | POA: Diagnosis not present

## 2019-01-27 DIAGNOSIS — I129 Hypertensive chronic kidney disease with stage 1 through stage 4 chronic kidney disease, or unspecified chronic kidney disease: Secondary | ICD-10-CM | POA: Diagnosis not present

## 2019-01-27 DIAGNOSIS — I82409 Acute embolism and thrombosis of unspecified deep veins of unspecified lower extremity: Secondary | ICD-10-CM | POA: Diagnosis not present

## 2019-01-27 DIAGNOSIS — E1142 Type 2 diabetes mellitus with diabetic polyneuropathy: Secondary | ICD-10-CM | POA: Diagnosis not present

## 2019-01-27 DIAGNOSIS — E1151 Type 2 diabetes mellitus with diabetic peripheral angiopathy without gangrene: Secondary | ICD-10-CM | POA: Diagnosis not present

## 2019-01-27 DIAGNOSIS — I2510 Atherosclerotic heart disease of native coronary artery without angina pectoris: Secondary | ICD-10-CM | POA: Diagnosis not present

## 2019-01-27 LAB — SARS CORONAVIRUS 2 (TAT 6-24 HRS): SARS Coronavirus 2: NEGATIVE

## 2019-01-27 NOTE — Progress Notes (Signed)
Spoke with [name], pt's daughter for pre-op call. DPR on file. Pt has hx of CAD, cardiologist is Dr. Dorris Carnes. Pt is on Eliquis, [name] states pt was to hold for 3 days prior and last dose was this AM. [name] states pt has not complained any recent chest pain or sob. Pt is a type 2 Diabetic. Last A1C was 5.0 on 10/27/18. [name] states pt's fasting blood sugar is around 106. Instructed [name] to have pt take 1/2 of his regular dose of Tresiba Insulin Monday AM. Instructed her to have pt check his blood sugar when he gets up and every 2 hours until he leaves for the hospital. If blood sugar is >220 take 1/2 of usual correction dose of Novolog insulin. If blood sugar is 70 or below, treat with 1/2 cup of clear juice (apple or cranberry) and recheck blood sugar 15 minutes after drinking juice. If blood sugar continues to be 70 or below, call the Short Stay department and ask to speak to a nurse. [name] voiced understanding.   Pt had Covid test done today. [name] states pt has been at home since and understands about the need for quarantine.    Coronavirus Screening  Have you experienced the following symptoms:  Cough NO Fever (>100.82F)  NO Runny nose NO Sore throat NO Difficulty breathing/shortness of breath NO  Have you or a family member traveled in the last 14 days and where? NO   [name] was reminded that hospital visitation restrictions are in effect and the importance of the restrictions.

## 2019-01-28 DIAGNOSIS — N186 End stage renal disease: Secondary | ICD-10-CM | POA: Diagnosis not present

## 2019-01-28 DIAGNOSIS — N2581 Secondary hyperparathyroidism of renal origin: Secondary | ICD-10-CM | POA: Diagnosis not present

## 2019-01-28 DIAGNOSIS — Z992 Dependence on renal dialysis: Secondary | ICD-10-CM | POA: Diagnosis not present

## 2019-01-30 ENCOUNTER — Ambulatory Visit (HOSPITAL_COMMUNITY): Payer: Medicare HMO | Admitting: Anesthesiology

## 2019-01-30 ENCOUNTER — Encounter (HOSPITAL_COMMUNITY)
Admission: RE | Disposition: A | Discharge status: Home / Self Care | Payer: Self-pay | Source: Home / Self Care | Attending: Vascular Surgery

## 2019-01-30 ENCOUNTER — Encounter (HOSPITAL_COMMUNITY): Payer: Self-pay | Admitting: Certified Registered"

## 2019-01-30 ENCOUNTER — Ambulatory Visit (HOSPITAL_COMMUNITY)
Admission: RE | Admit: 2019-01-30 | Discharge: 2019-01-30 | Disposition: A | Discharge status: Home / Self Care | Payer: Medicare HMO | Attending: Vascular Surgery | Admitting: Vascular Surgery

## 2019-01-30 DIAGNOSIS — Z87891 Personal history of nicotine dependence: Secondary | ICD-10-CM | POA: Diagnosis not present

## 2019-01-30 DIAGNOSIS — Z992 Dependence on renal dialysis: Secondary | ICD-10-CM | POA: Insufficient documentation

## 2019-01-30 DIAGNOSIS — Z79899 Other long term (current) drug therapy: Secondary | ICD-10-CM | POA: Diagnosis not present

## 2019-01-30 DIAGNOSIS — I2510 Atherosclerotic heart disease of native coronary artery without angina pectoris: Secondary | ICD-10-CM | POA: Diagnosis not present

## 2019-01-30 DIAGNOSIS — Z7901 Long term (current) use of anticoagulants: Secondary | ICD-10-CM | POA: Insufficient documentation

## 2019-01-30 DIAGNOSIS — I120 Hypertensive chronic kidney disease with stage 5 chronic kidney disease or end stage renal disease: Secondary | ICD-10-CM | POA: Diagnosis not present

## 2019-01-30 DIAGNOSIS — Z794 Long term (current) use of insulin: Secondary | ICD-10-CM | POA: Diagnosis not present

## 2019-01-30 DIAGNOSIS — J449 Chronic obstructive pulmonary disease, unspecified: Secondary | ICD-10-CM | POA: Diagnosis not present

## 2019-01-30 DIAGNOSIS — N186 End stage renal disease: Secondary | ICD-10-CM | POA: Diagnosis not present

## 2019-01-30 DIAGNOSIS — I10 Essential (primary) hypertension: Secondary | ICD-10-CM | POA: Diagnosis not present

## 2019-01-30 DIAGNOSIS — Z955 Presence of coronary angioplasty implant and graft: Secondary | ICD-10-CM | POA: Insufficient documentation

## 2019-01-30 DIAGNOSIS — E1022 Type 1 diabetes mellitus with diabetic chronic kidney disease: Secondary | ICD-10-CM | POA: Insufficient documentation

## 2019-01-30 DIAGNOSIS — E1051 Type 1 diabetes mellitus with diabetic peripheral angiopathy without gangrene: Secondary | ICD-10-CM | POA: Diagnosis not present

## 2019-01-30 DIAGNOSIS — Z7951 Long term (current) use of inhaled steroids: Secondary | ICD-10-CM | POA: Diagnosis not present

## 2019-01-30 DIAGNOSIS — E1122 Type 2 diabetes mellitus with diabetic chronic kidney disease: Secondary | ICD-10-CM | POA: Diagnosis not present

## 2019-01-30 DIAGNOSIS — E785 Hyperlipidemia, unspecified: Secondary | ICD-10-CM | POA: Insufficient documentation

## 2019-01-30 DIAGNOSIS — G8929 Other chronic pain: Secondary | ICD-10-CM | POA: Insufficient documentation

## 2019-01-30 DIAGNOSIS — N185 Chronic kidney disease, stage 5: Secondary | ICD-10-CM | POA: Diagnosis not present

## 2019-01-30 HISTORY — DX: Anemia, unspecified: D64.9

## 2019-01-30 HISTORY — PX: AV FISTULA PLACEMENT: SHX1204

## 2019-01-30 LAB — POCT I-STAT 4, (NA,K, GLUC, HGB,HCT)
Glucose, Bld: 95 mg/dL (ref 70–99)
HCT: 35 % — ABNORMAL LOW (ref 39.0–52.0)
Hemoglobin: 11.9 g/dL — ABNORMAL LOW (ref 13.0–17.0)
Potassium: 3.8 mmol/L (ref 3.5–5.1)
Sodium: 139 mmol/L (ref 135–145)

## 2019-01-30 LAB — GLUCOSE, CAPILLARY
Glucose-Capillary: 95 mg/dL (ref 70–99)
Glucose-Capillary: 98 mg/dL (ref 70–99)
Glucose-Capillary: 96 mg/dL (ref 70–99)

## 2019-01-30 SURGERY — ARTERIOVENOUS (AV) FISTULA CREATION
Anesthesia: Monitor Anesthesia Care | Laterality: Left

## 2019-01-30 MED ORDER — 0.9 % SODIUM CHLORIDE (POUR BTL) OPTIME
TOPICAL | Status: DC | PRN
  Administered 2019-01-30: 1000 mL

## 2019-01-30 MED ORDER — PHENYLEPHRINE 40 MCG/ML (10ML) SYRINGE FOR IV PUSH (FOR BLOOD PRESSURE SUPPORT)
PREFILLED_SYRINGE | INTRAVENOUS | Status: DC | PRN
  Administered 2019-01-30 (×4): 80 ug via INTRAVENOUS

## 2019-01-30 MED ORDER — PROPOFOL 500 MG/50ML IV EMUL
INTRAVENOUS | Status: DC | PRN
  Administered 2019-01-30: 75 ug/kg/min via INTRAVENOUS

## 2019-01-30 MED ORDER — LIDOCAINE HCL (PF) 1 % IJ SOLN
INTRAMUSCULAR | Status: DC | PRN
  Administered 2019-01-30: 12 mL

## 2019-01-30 MED ORDER — PHENYLEPHRINE 40 MCG/ML (10ML) SYRINGE FOR IV PUSH (FOR BLOOD PRESSURE SUPPORT)
PREFILLED_SYRINGE | INTRAVENOUS | Status: AC
  Filled 2019-01-30: qty 10

## 2019-01-30 MED ORDER — SODIUM CHLORIDE 0.9 % IV SOLN
INTRAVENOUS | Status: DC
  Administered 2019-01-30: 08:00:00 via INTRAVENOUS

## 2019-01-30 MED ORDER — FENTANYL CITRATE (PF) 250 MCG/5ML IJ SOLN
INTRAMUSCULAR | Status: AC
  Filled 2019-01-30: qty 5

## 2019-01-30 MED ORDER — CEFAZOLIN SODIUM-DEXTROSE 2-4 GM/100ML-% IV SOLN
2.0000 g | INTRAVENOUS | Status: AC
  Administered 2019-01-30: 2 g via INTRAVENOUS
  Filled 2019-01-30: qty 100

## 2019-01-30 MED ORDER — ACETAMINOPHEN 500 MG PO TABS
1000.0000 mg | ORAL_TABLET | Freq: Once | ORAL | Status: AC
  Administered 2019-01-30: 1000 mg via ORAL
  Filled 2019-01-30: qty 2

## 2019-01-30 MED ORDER — ONDANSETRON HCL 4 MG/2ML IJ SOLN
INTRAMUSCULAR | Status: AC
  Filled 2019-01-30: qty 2

## 2019-01-30 MED ORDER — LIDOCAINE-EPINEPHRINE (PF) 1 %-1:200000 IJ SOLN
INTRAMUSCULAR | Status: AC
  Filled 2019-01-30: qty 30

## 2019-01-30 MED ORDER — PROPOFOL 10 MG/ML IV BOLUS
INTRAVENOUS | Status: DC | PRN
  Administered 2019-01-30: 20 mg via INTRAVENOUS

## 2019-01-30 MED ORDER — SODIUM CHLORIDE 0.9 % IV SOLN
0.2500 mg/kg/h | INTRAVENOUS | Status: AC
  Administered 2019-01-30: 3 mg/kg/h via INTRAVENOUS
  Filled 2019-01-30: qty 250

## 2019-01-30 MED ORDER — OXYCODONE HCL 5 MG PO TABS: 5.0000 mg | ORAL_TABLET | ORAL | 0 refills | Status: DC | PRN

## 2019-01-30 MED ORDER — ONDANSETRON HCL 4 MG/2ML IJ SOLN
INTRAMUSCULAR | Status: DC | PRN
  Administered 2019-01-30: 4 mg via INTRAVENOUS

## 2019-01-30 MED ORDER — MIDAZOLAM HCL 5 MG/5ML IJ SOLN
INTRAMUSCULAR | Status: DC | PRN
  Administered 2019-01-30: 1 mg via INTRAVENOUS

## 2019-01-30 MED ORDER — FENTANYL CITRATE (PF) 100 MCG/2ML IJ SOLN
INTRAMUSCULAR | Status: DC | PRN
  Administered 2019-01-30: 50 ug via INTRAVENOUS

## 2019-01-30 MED ORDER — MIDAZOLAM HCL 2 MG/2ML IJ SOLN
INTRAMUSCULAR | Status: AC
  Filled 2019-01-30: qty 2

## 2019-01-30 SURGICAL SUPPLY — 30 items
ARMBAND PINK RESTRICT EXTREMIT (MISCELLANEOUS) ×4 IMPLANT
CANISTER SUCT 3000ML PPV (MISCELLANEOUS) ×3 IMPLANT
CANNULA VESSEL 3MM 2 BLNT TIP (CANNULA) ×3 IMPLANT
CLIP VESOCCLUDE MED 6/CT (CLIP) ×3 IMPLANT
CLIP VESOCCLUDE SM WIDE 6/CT (CLIP) ×3 IMPLANT
COVER PROBE W GEL 5X96 (DRAPES) IMPLANT
COVER WAND RF STERILE (DRAPES) ×1 IMPLANT
DECANTER SPIKE VIAL GLASS SM (MISCELLANEOUS) ×3 IMPLANT
DERMABOND ADVANCED (GAUZE/BANDAGES/DRESSINGS) ×2
DERMABOND ADVANCED .7 DNX12 (GAUZE/BANDAGES/DRESSINGS) ×1 IMPLANT
ELECT REM PT RETURN 9FT ADLT (ELECTROSURGICAL) ×3
ELECTRODE REM PT RTRN 9FT ADLT (ELECTROSURGICAL) ×1 IMPLANT
GLOVE BIO SURGEON STRL SZ7.5 (GLOVE) ×3 IMPLANT
GLOVE BIOGEL PI IND STRL 8 (GLOVE) ×1 IMPLANT
GLOVE BIOGEL PI INDICATOR 8 (GLOVE) ×2
GOWN STRL REUS W/ TWL LRG LVL3 (GOWN DISPOSABLE) ×3 IMPLANT
GOWN STRL REUS W/TWL LRG LVL3 (GOWN DISPOSABLE) ×6
KIT BASIN OR (CUSTOM PROCEDURE TRAY) ×3 IMPLANT
KIT TURNOVER KIT B (KITS) ×3 IMPLANT
NS IRRIG 1000ML POUR BTL (IV SOLUTION) ×3 IMPLANT
PACK CV ACCESS (CUSTOM PROCEDURE TRAY) ×3 IMPLANT
PAD ARMBOARD 7.5X6 YLW CONV (MISCELLANEOUS) ×6 IMPLANT
SPONGE SURGIFOAM ABS GEL 100 (HEMOSTASIS) IMPLANT
SUT PROLENE 6 0 BV (SUTURE) ×5 IMPLANT
SUT VIC AB 3-0 SH 27 (SUTURE) ×2
SUT VIC AB 3-0 SH 27X BRD (SUTURE) ×1 IMPLANT
SUT VICRYL 4-0 PS2 18IN ABS (SUTURE) ×3 IMPLANT
TOWEL GREEN STERILE (TOWEL DISPOSABLE) ×3 IMPLANT
UNDERPAD 30X30 (UNDERPADS AND DIAPERS) ×3 IMPLANT
WATER STERILE IRR 1000ML POUR (IV SOLUTION) ×3 IMPLANT

## 2019-01-30 NOTE — Anesthesia Preprocedure Evaluation (Signed)
Anesthesia Evaluation  Patient identified by MRN, date of birth, ID band Patient awake    Reviewed: Allergy & Precautions, H&P , NPO status , Patient's Chart, lab work & pertinent test results, reviewed documented beta blocker date and time   Airway Mallampati: II  TM Distance: >3 FB Neck ROM: Full    Dental no notable dental hx. (+) Edentulous Upper, Edentulous Lower, Dental Advisory Given   Pulmonary COPD,  COPD inhaler, Current Smoker, former smoker,    Pulmonary exam normal breath sounds clear to auscultation       Cardiovascular hypertension, Pt. on medications and Pt. on home beta blockers + CAD, + Cardiac Stents and + Peripheral Vascular Disease   Rhythm:Regular Rate:Normal     Neuro/Psych negative neurological ROS  negative psych ROS   GI/Hepatic negative GI ROS, Neg liver ROS, GERD  ,  Endo/Other  diabetes, Type 1, Insulin Dependent  Renal/GU ESRFRenal disease  negative genitourinary   Musculoskeletal   Abdominal   Peds  Hematology  (+) Blood dyscrasia, anemia ,   Anesthesia Other Findings   Reproductive/Obstetrics negative OB ROS                             Anesthesia Physical  Anesthesia Plan  ASA: III  Anesthesia Plan: MAC   Post-op Pain Management:    Induction: Intravenous  PONV Risk Score and Plan: 1 and Propofol infusion and Ondansetron  Airway Management Planned: Nasal Cannula and Natural Airway  Additional Equipment:   Intra-op Plan:   Post-operative Plan:   Informed Consent: I have reviewed the patients History and Physical, chart, labs and discussed the procedure including the risks, benefits and alternatives for the proposed anesthesia with the patient or authorized representative who has indicated his/her understanding and acceptance.     Dental advisory given  Plan Discussed with: CRNA  Anesthesia Plan Comments:         Anesthesia Quick  Evaluation

## 2019-01-30 NOTE — Anesthesia Postprocedure Evaluation (Signed)
Anesthesia Post Note  Patient: Flynt Breeze.  Procedure(s) Performed: LEFT BRACHIOCEPHALIC ARTERIOVENOUS (AV) FISTULA CREATION (Left )     Patient location during evaluation: PACU Anesthesia Type: MAC Level of consciousness: awake and alert Pain management: pain level controlled Vital Signs Assessment: post-procedure vital signs reviewed and stable Respiratory status: spontaneous breathing and respiratory function stable Cardiovascular status: stable Postop Assessment: no apparent nausea or vomiting Anesthetic complications: no    Last Vitals:  Vitals:   01/30/19 1206 01/30/19 1218  BP: (!) 157/89 (!) 154/87  Pulse: 66 68  Resp: 19 15  Temp:    SpO2: 99% 100%    Last Pain:  Vitals:   01/30/19 1218  TempSrc:   PainSc: 0-No pain                 Derwin Reddy DANIEL

## 2019-01-30 NOTE — Op Note (Signed)
    NAME: [name].    MRN: [id number] DOB: [birth year]    DATE OF OPERATION: 01/30/2019  PREOP DIAGNOSIS:    End-stage renal disease  POSTOP DIAGNOSIS:    Same  PROCEDURE:    Left brachiocephalic AV fistula  SURGEON: Judeth Cornfield. Scot Dock, MD, FACS  ASSIST: Izetta Dakin, RNFA  ANESTHESIA: Local with sedation  EBL: Minimal  INDICATIONS:    [name]. is a 73 y.o. male who presents for new access.  FINDINGS:   4 mm upper arm cephalic vein  TECHNIQUE:   The patient was taken to the operating room and sedated by anesthesia.  The left upper extremity was prepped and draped in the usual sterile fashion.  After the skin was anesthetized with 1% lidocaine a longitudinal incision was made over the brachial artery which was dissected free and controlled with a vessel loop.  It was soft with a good pulse.  After the skin was anesthetized a separate longitudinal incision was made over the cephalic vein.  This was dissected free and ligated distally.  It irrigated up nicely with heparinized saline.  A tunnel was created between the 2 incisions and the vein brought to the tunnel.  The patient had received Angiomax.  The brachial artery was clamped proximally distally and a longitudinal arteriotomy was made.  The vein was sewn into side to the artery using continuous 6-0 Prolene suture.  At the completion was an excellent thrill in the fistula.  There was a radial and ulnar signal with the Doppler.  Hemostasis was obtained in the wound.  The wound was closed with a deep layer of 3-0 Vicryl and the skin closed with 4-0 Vicryl.  Dermabond was applied to both incisions.  The patient tolerated the procedure well was transferred to the recovery room in stable condition.  All needle and sponge counts were correct.  [name], MD, FACS [facility] DICTATION:   01/30/2019

## 2019-01-30 NOTE — Interval H&P Note (Signed)
History and Physical Interval Note:  01/30/2019 9:27 AM  [name].  has presented today for surgery, with the diagnosis of end stage renal disease.  The various methods of treatment have been discussed with the patient and family. After consideration of risks, benefits and other options for treatment, the patient has consented to  Procedure(s): ARTERIOVENOUS (AV) FISTULA CREATION VERSUS GRAFT PLACEMENT (Left) as a surgical intervention.  The patient's history has been reviewed, patient examined, no change in status, stable for surgery.  I have reviewed the patient's chart and labs.  Questions were answered to the patient's satisfaction.     [name]

## 2019-01-30 NOTE — Transfer of Care (Signed)
Immediate Anesthesia Transfer of Care Note  Patient: [name].  Procedure(s) Performed: LEFT BRACHIOCEPHALIC ARTERIOVENOUS (AV) FISTULA CREATION (Left )  Patient Location: PACU  Anesthesia Type:MAC  Level of Consciousness: drowsy and patient cooperative  Airway & Oxygen Therapy: Patient Spontanous Breathing and Patient connected to nasal cannula oxygen  Post-op Assessment: Report given to RN, Post -op Vital signs reviewed and stable and Patient moving all extremities  Post vital signs: Reviewed and stable  Last Vitals:  Vitals Value Taken Time  BP 103/70 01/30/19 1121  Temp    Pulse 31 01/30/19 1121  Resp 17 01/30/19 1121  SpO2 100 % 01/30/19 1121  Vitals shown include unvalidated device data.  Last Pain:  Vitals:   01/30/19 0716  TempSrc: Oral         Complications: No apparent anesthesia complications

## 2019-01-31 ENCOUNTER — Encounter (HOSPITAL_COMMUNITY): Payer: Self-pay | Admitting: Vascular Surgery

## 2019-01-31 DIAGNOSIS — Z992 Dependence on renal dialysis: Secondary | ICD-10-CM | POA: Diagnosis not present

## 2019-01-31 DIAGNOSIS — N2581 Secondary hyperparathyroidism of renal origin: Secondary | ICD-10-CM | POA: Diagnosis not present

## 2019-01-31 DIAGNOSIS — N186 End stage renal disease: Secondary | ICD-10-CM | POA: Diagnosis not present

## 2019-02-01 ENCOUNTER — Other Ambulatory Visit: Payer: Self-pay | Admitting: Family Medicine

## 2019-02-01 NOTE — Telephone Encounter (Signed)
Patient requesting a refill on Oxycodone     LOV: 11/14/2018  LRF:   12/28/18

## 2019-02-02 DIAGNOSIS — Z992 Dependence on renal dialysis: Secondary | ICD-10-CM | POA: Diagnosis not present

## 2019-02-02 DIAGNOSIS — N2581 Secondary hyperparathyroidism of renal origin: Secondary | ICD-10-CM | POA: Diagnosis not present

## 2019-02-02 DIAGNOSIS — N186 End stage renal disease: Secondary | ICD-10-CM | POA: Diagnosis not present

## 2019-02-02 MED ORDER — OXYCODONE-ACETAMINOPHEN 10-325 MG PO TABS: 1.0000 | ORAL_TABLET | ORAL | 0 refills | Status: DC | PRN

## 2019-02-04 DIAGNOSIS — Z992 Dependence on renal dialysis: Secondary | ICD-10-CM | POA: Diagnosis not present

## 2019-02-04 DIAGNOSIS — N2581 Secondary hyperparathyroidism of renal origin: Secondary | ICD-10-CM | POA: Diagnosis not present

## 2019-02-04 DIAGNOSIS — N186 End stage renal disease: Secondary | ICD-10-CM | POA: Diagnosis not present

## 2019-02-06 ENCOUNTER — Telehealth: Payer: Self-pay | Admitting: Family Medicine

## 2019-02-06 DIAGNOSIS — N186 End stage renal disease: Secondary | ICD-10-CM | POA: Diagnosis not present

## 2019-02-06 NOTE — Telephone Encounter (Signed)
I do not think his calcium acetate would cause nausea or weakness.  Would NTBS to determine what is the cause.

## 2019-02-06 NOTE — Telephone Encounter (Signed)
Patient aware of providers recommendations via vm 

## 2019-02-06 NOTE — Telephone Encounter (Signed)
Pt's daughter [name] called and states that the pt is c/o nausea and weakness and think it may be his calcium acetate that he takes tid and was wanting to know what he could do about it? Or does he need to come in for an apt?

## 2019-02-07 ENCOUNTER — Other Ambulatory Visit: Payer: Self-pay

## 2019-02-07 ENCOUNTER — Encounter: Payer: Self-pay | Admitting: Family Medicine

## 2019-02-07 ENCOUNTER — Ambulatory Visit (INDEPENDENT_AMBULATORY_CARE_PROVIDER_SITE_OTHER): Payer: Medicare HMO | Admitting: Family Medicine

## 2019-02-07 VITALS — BP 152/70 | HR 56 | Temp 97.8°F | Resp 16 | Ht 70.0 in | Wt 172.0 lb

## 2019-02-07 DIAGNOSIS — Z992 Dependence on renal dialysis: Secondary | ICD-10-CM | POA: Diagnosis not present

## 2019-02-07 DIAGNOSIS — R29898 Other symptoms and signs involving the musculoskeletal system: Secondary | ICD-10-CM | POA: Diagnosis not present

## 2019-02-07 DIAGNOSIS — N059 Unspecified nephritic syndrome with unspecified morphologic changes: Secondary | ICD-10-CM

## 2019-02-07 DIAGNOSIS — I4891 Unspecified atrial fibrillation: Secondary | ICD-10-CM | POA: Insufficient documentation

## 2019-02-07 DIAGNOSIS — R110 Nausea: Secondary | ICD-10-CM | POA: Diagnosis not present

## 2019-02-07 DIAGNOSIS — N186 End stage renal disease: Secondary | ICD-10-CM | POA: Diagnosis not present

## 2019-02-07 DIAGNOSIS — I480 Paroxysmal atrial fibrillation: Secondary | ICD-10-CM

## 2019-02-07 DIAGNOSIS — N2581 Secondary hyperparathyroidism of renal origin: Secondary | ICD-10-CM | POA: Diagnosis not present

## 2019-02-07 MED ORDER — METOCLOPRAMIDE HCL 5 MG PO TABS: 5.0000 mg | ORAL_TABLET | Freq: Two times a day (BID) | ORAL | 0 refills | Status: DC

## 2019-02-07 NOTE — Progress Notes (Signed)
Subjective:    Patient ID: [name]., male    DOB: 1946/06/05, 73 y.o.   MRN: [id number]  HPI  07/19/18 At my last visit, the patient was having symptoms from her concerning for claudication.  Initially her work-up included evaluation of his peripheral vascular disease.  However it turned out the patient was having neurogenic claudication secondary to thoracic myelopathy.  Recently admitted to the hospital for back surgery.  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  Postop [name] doing very well.  Preoperative back and lower extremity pain much improved.  Standing and walking much better.  Continent of urine.  Patient feels much better and ready for discharge home.  07/19/18 Here today for follow-up.  I reviewed labs from June 02, 2018.  At that time he had a BMP which showed a creatinine of 0.99 as well as a blood sugar of 109.  Hemoglobin A1c was obtained at that point and showed excellent glycemic control at 5.3.  A CBC was obtained which was significant for a normal white blood cell count, normal hemoglobin however mild thrombocytopenia with platelet count of 106.  Patient is overdue for a fasting lipid panel.  He is taking Lipitor 80 mg a day as well as Zetia 10 mg a day.  Ideally his LDL cholesterol be less than 70.  Given his extensive cardiovascular history including significant peripheral vascular disease as well as coronary artery disease with history of stenting of his right coronary artery, patient is at high risk for congestive heart failure.  Patient states that he is doing much  better after the surgery.  The pain has improved in his legs with ambulation.  He is no longer having to use a cane or walker to get around.  However he reports severe fatigue.  He states that he sleeps sometimes 12 to 15 hours a day.  However he still feels extremely tired.  He can easily fall asleep despite getting a good night's rest the night before.  He never feels rested.  He is always sleepy.  He denies any chest pain however he does have chronic shortness of breath with exertion.  He denies any angina.  He denies any weight loss or fevers.  At that time, my plan was: Given the patient's extensive past medical history, the differential diagnosis for his fatigue and hypersomnolence is large.  The biggest concern I have is for obstructive sleep apnea.  He was scheduled for a sleep study in 2017 however I do not see where that ever occurred.  Therefore I recommended a referral for a sleep study to evaluate for obstructive sleep apnea.  This may also explain his elevated blood pressure as well.  Also given his history, I would be concerned about congestive heart failure and therefore I would recommend an echocardiogram.  The patient does have congestive heart failure, in addition to tailoring his medication, I will switch him from metformin to [place] for his diabetes.  His blood pressure is elevated today however I am concerned that his medication list is incorrect.  The patient is not certain exactly what he is taking.  Therefore of asked the patient  go home and call us back immediately and talk to my nurse and give [place] an updated medicine list with exactly what medication he is taking we can adjust his medication appropriately to achieve control his blood pressure.  I would also check a CBC, CMP, TSH, B12.    09/23/18 My recommendations based on his labs were: Labs show mild anemia but I do not believe this is causing his fatigue. Recommend sleep study. Wt Readings from Last 3 Encounters:  02/07/19  172 lb (78 kg)  01/30/19 175 lb (79.4 kg)  01/18/19 177 lb 4.8 oz (80.4 kg)   Since the patient's visit in January, he has lost 12 pounds.  Over the last few weeks he has developed epigastric pain.  It is worse as soon as he eats.  He reports nausea and vomiting.  He denies any melena or hematochezia.  He denies any bright red blood per rectum.  Pain is located in the epigastric area.  However his abdomen today is soft nondistended with normal bowel sounds and no masses other than the abdominal bruit secondary to his bypass.  Patient states however he developed severe epigastric pain as soon as he eats.  Even the thought of food makes him sick.  He denies any chest pain or shortness of breath.  He denies any jaundice.  Patient still has his gallbladder.  CT scan was performed of the abdomen and pelvis in November.  Results are dictated below: FINDINGS: Lower chest: Heart size is normal. There is no significant pericardial fluid, thickening or pericardial calcification. Calcifications of the aortic valve. There is aortic atherosclerosis, as well as atherosclerosis of the coronary arteries, including calcified atherosclerotic plaque in the left main, left anterior descending, left circumflex and right coronary arteries.  Hepatobiliary: Several small calcified granulomas are noted in the liver, including an irregular cluster of calcified granulomas in the right lobe near the dome, which appeared suspicious for potential lesion on prior examinations. However, no discrete suspicious cystic or solid hepatic lesions are identified on today's examination. No intra or extrahepatic biliary ductal dilatation. Gallbladder is normal in appearance.  Pancreas: No pancreatic mass. No pancreatic ductal dilatation. No pancreatic or peripancreatic fluid or inflammatory changes.  Spleen: Unremarkable.  Adrenals/Urinary Tract: 3 mm nonobstructive calculus in the lower pole collecting system of the left  kidney. Multiple subcentimeter low-attenuation lesions are noted in both kidneys, too small to definitively characterize, but statistically likely to represent tiny cysts. No hydroureteronephrosis in the visualized portions of the abdomen. Bilateral adrenal glands are normal in appearance.  Stomach/Bowel: Normal appearance of the stomach. No pathologic dilatation of visualized portions of small bowel or colon. Numerous colonic diverticulae are noted, without surrounding inflammatory changes to suggest an acute diverticulitis at this time.  Vascular/Lymphatic: Aortic atherosclerosis with complete occlusion of the infrarenal abdominal aorta. Reconstitution of flow in the pelvic vasculature, presumably related to the patient's partially visualized bypass graft overlying the left lower hemithorax and left anterior abdominal wall, presumably an axillary femoral bypass graft. No lymphadenopathy noted in the abdomen or pelvis.  Other: No significant volume of ascites and no pneumoperitoneum in the visualized portions of the abdomen.  Musculoskeletal: There are no aggressive appearing lytic or blastic lesions noted in the visualized portions of the skeleton.  IMPRESSION: 1. No suspicious liver lesions. The findings on the prior study appear to represent an irregular cluster of calcified granulomas. This is a benign finding. 2. Aortic atherosclerosis, in addition to left main and 3 vessel coronary artery  artery disease. In addition, there is complete occlusion of the infrarenal abdominal aorta. Reconstitution of flow in the pelvis presumably from the patient's left-sided bypass graft. 3. 3 mm nonobstructive calculus in the lower pole collecting system of left kidney. Although not a definitive study for the gallbladder, there was no mention of any gallstones.  At that time, my plan was: Differential diagnosis includes pancreatitis versus pancreatic neoplasm, biliary colic, intestinal  ischemia, peptic ulcer disease, gastric cancer, or medication reaction to metformin.  Patient would like to stop metformin and losartan because he believes the symptoms started right when he started these medications.  I will evaluate for pancreatic inflammation with a lipase as well as a CBC and a CMP.  I am very concerned about intestinal ischemia given his past medical history.  However I also believe he needs a GI consultation for EGD to rule out gastric cancer or peptic ulcer disease.  Obtain CT angiogram to evaluate for intestinal ischemia of the abdomen.  Also, patient has had numerous abdominal surgeries so intestinal adhesions possibly: Partial bowel obstruction would be also on the differential however I believe this is less likely given the fact his abdomen is soft and nondistended today  09/26/18 Patient's lab work returned markedly abnormal this morning.  Creatinine had risen to 4.67.  Hemoglobin had dropped to 9.8.  Given the acute renal failure, I recommended possibly going to the emergency room.  However when we called the patient, he stated that he felt better after discontinuing his medication.  At his last office visit I discontinued losartan and metformin as described above.  He states that he felt much better and was not vomiting.  Therefore we elected to try to keep the patient on the hospital and reevaluate him here today for his acute renal failure.  As discussed above, the patient states that he was unable to eat hardly anything for the last 3 weeks.  He was drinking a lot of water to try to maintain his hydration but he was not able to keep down hardly any solid food.  The patient states that he felt like he was dying last week.  However since discontinuing the metformin and the losartan, he states that the nausea and vomiting has completely stopped.  He is now able to eat and drink better.  Patient states that he is about 50% better.  His only lingering symptom is fatigue and weakness.   He states that he just has very little energy.  At that time, my plan was: Patient wants to avoid going to the hospital especially during the coronavirus pandemic.  Therefore we will institute a work-up for acute renal failure as an outpatient as long as it is reasonable.  At the present time he has no urgent sign for dialysis.  He is not acidotic.  He is not fluid overloaded.  He has no electrolyte disturbances, he is not uremic.  Therefore the present time he is still capable of being worked up as an outpatient.  I will obtain a renal ultrasound to evaluate for any evidence of obstructive nephropathy, renal masses, etc.  I will obtain a renal artery ultrasound given the fact his acute renal failure occurred while taking losartan to rule out renal artery stenosis particular given his underlying vascular history.  I will obtain a urinalysis to evaluate for any evidence of glomerulonephritis.  I will check an SPEP to evaluate for any evidence of multiple myeloma.  However my suspicion to date as I discussed  with the patient is I believe this was multifactorial and primarily due to prerenal azotemia.  I believe his nausea and diarrhea was triggered by the metformin.  I believe this led to dehydration that caused acute renal insufficiency.  I believe then this was exacerbated by his angiotensin receptor blocker which worsened his renal insufficiency via acute tubular necrosis.  I then suspect that the patient may have developed lactic acidosis due to the metformin causing his abdominal pain.  Simply discontinuing his medications have improved his symptoms dramatically.  Therefore I will obtain a BMP to monitor his electrolytes and creatinine today.  Patient will be rehydrated with 1 L of normal saline.  Begin the work-up and recheck the patient later this week and encourage patient to push fluids over the week to improve hydration.  I plan to have the patient come in for lab work on Wednesday to repeat his BMP and  then I would like to see him back in the office on Thursday to see how he is doing.  Urinalysis today shows +3 blood +3 protein.  Is negative nitrites.  Negative leukocyte esterase.  Microscopic analysis shows 0-5 white blood cells 40-60 red blood cells 0-5 squames few bacteria no crystals no casts no yeast.  I believe this is more likely suggestive of ATN  09/27/18 Patient is here today for follow-up as planned.  He had his renal ultrasound this morning.  The results are dictated below however no hydronephrosis or obstructive nephropathy is seen: Right Kidney:  Renal measurements: 12.4 x 6.5 x 5.4 cm = volume: 229 mL . Echogenicity within normal limits. No mass or hydronephrosis visualized.  Left Kidney:  Renal measurements: 14.5 x 5.7 x 6.5 cm = volume: 279 mL. Small cysts in the left kidney, the largest 1.2 cm. Normal echotexture. No hydronephrosis.  Bladder:  Appears normal for degree of bladder distention.  Unfortunately, patient's creatinine has worsened further.  It is now up to 6.02.  More concerning for that is now the patient is demonstrating acidosis as his bicarb is dropped to 15.  He is also demonstrating possible symptoms of uremia.  He reports increasing fatigue.  His weakness has worsened since yesterday despite receiving IV fluids.  He also reports mild confusion.  For instance this morning, when he went to pay to do the ultrasound, he gave the radiologist his debit card instead of his credit card.  When they asked for his credit card, he gave him his driver's license.  Patient states that he feels more lethargic and weak today than he did yesterday.  At that time, my plan was: Unfortunately, the patient's acute renal failure seems to be worsening despite receiving IV fluids and stopping his medications.  I suspect acute tubular necrosis based on his urinalysis results.  Unfortunately he is failing outpatient conservative therapy and therefore I believe he needs admission  with IV fluids and nephrology consultation.  I do not feel this can safely be performed as an outpatient as the patient is starting to demonstrate symptoms of uremia with his mild confusion this morning and his worsening fatigue.  His lab work also suggest mild metabolic acidosis which may necessitate dialysis if he continues down this pathway.  Therefore he will go directly to the emergency room today.  We will call and notify them of his impending arrival  10/24/18 Patient was admitted to the hospital and renal was consulted ultimately the patient underwent a renal biopsy which revealed glomerulonephritis.  I have copied relevant  portions of the discharge summary and included them below for my reference:  Admit date: 09/27/2018 Discharge date: 10/20/2018  Brief/Interim Summary: 73 year old male with past medical history of diabetes mellitus, hypertension and COPD plus history of bladder cancer admitted on 3/31 after coming in with worsening renal failure after being sent over from his primary care doctor's office. Patient at that time related bouts of diarrhea for the past 3 weeks and also found to have a urinalysis with 3+ proteinuria and hematuria. Nephrology consulted and patient went renal biopsy on 10/10/18. 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 Uro was consulted and s/pCystoscopy and cleared for Rituxan infusiondue to history of recurrent bladder cancer. he has been started on dialysis on 4/10/20w Rt IJ HD cath.  4/22: OP HD has been set [place]. At this point patient has been stabilized medically, is on regular scheduled dialysis which is set up for TTS.  We will continue on steroids, prophylactic Bactrim, follow-up with nephrology for outpatient lab monitoring and rituximab infusion and renal function  monitoring.  Discharge Diagnoses:   Acute renal failure:Status post renal biopsy 4/13 with pauci immune GNand minimal scarring,follow-up started on systemic steroid to be tapered off over 8 to 12 weeks, to cont at 60 mg/day for now, s/p rituximab IV x1 then in 24 day.on HD since 4/10,last HD 4/23. Has a right IJ dialysis catheter and is set up for outpatient dialysis TTS at [facility] starting 4/25 at 11 AM.Continue low K diet.  Discussed with the nephrologist this morning and he stable for discharge home today after dialysis.  Acute hypoxic respiratory failure from fluid overload due to renal failure.  Improved with dialysis.EF showed stable.  Pauci immuneGN:See #1.Rituximab as above, steroid and BactrimSSprophylaxis 3 times a week as per nephrology. TOC will arrange meds prior to d/c.  History of recurrent bladder cancer, seen by urology and underwent bedside cystoscopyand has noRecurrence.  Type 2 diabetes mellitus without complication,hba1c 5.3 in 05/2018. Stable mostlybut was hypoglycemic at times. Advised close monitoring of blood sugar at home  and use modified sliding scale insulin only for high blood sugar >180, since patient is on high-dose steroids and will be continued on it for some time, he is at risk for uncontrolled hyperglycemia and its complication. Patient reports that his daughter is diabetic and knows how to do that.  Patient agreed to continue on the insulin and is educated. Pt educated on hypoglycemia how to recognize and treat.    CAD/HTN/HLD:Stable with no chest pain. Decreased metoprolol to 25 given bradycardia and decrease Cardura.  Continue Zetia Lipitor and home Plavix  Anemia:hemoglobin stable, status post IV iron infusion, continue ESA per has a right IJ dialysis catheter  Tobacco abusecontinue nicotine patch  Patient is here today for follow-up.  Patient is accompanied by his daughter.  He is currently on Tuesday Thursday  Saturday dialysis.  He has a central line but they are currently using.  He was discharged on rapid acting insulin via sliding scale however he has not been using it as he was confused as to how to administer the insulin and when to administer the insulin.  He is also confused as to the reason his kidneys have shut down and exactly what his disease is.  Both he and his daughter have several questions.  My concern is on physical exam, the patient is tachypneic and seems to be breathing heavily.  He states that he is always short of breath however it has worsened since he was in the hospital.  He has +1 bipedal edema in his feet and his ankles but this stops just above his ankles.  His lungs are actually clear to auscultation bilaterally today.  I appreciate no evidence of pneumonia or pulmonary edema.  He is not wheezing.  He denies any fever or chest pain or pleurisy.  However he does report shortness of breath with minimal activity.  At that time, my plan was: I spent approximately 30 minutes today with the patient and his daughter explaining his condition.  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  I explained that without his kidneys he will die.  Therefore he will be on dialysis until his kidney function hopefully improves.  I explained that the reason he is taking high-dose prednisone as well as the Rituxan is to suppress his immune system with the hopes that the kidney function will recover.  Unfortunately the prednisone will cause hypoglycemia.  Therefore of asked him to check his blood sugar before meals and before bedtime.  I wrote the patient a sliding scale direction.  He will give 0 units sugars less than 150, 2 units from 1 51-200, 4 units from 201 to 250, 6 units from 2 51-300, 8 units from 301-400, and 10 units greater than 400.  We will recheck via telephone and calculate his total insulin requirement and then make adjustments in 1  week.  He will follow-up as planned with nephrology.  Given his shortness of breath I will check a d-dimer today and if elevated will send the patient for a VQ scan to evaluate for pulmonary embolism given his prolonged immobilization and tachypnea and shortness of breath.  Addendum from 10/25/2018  Patient's d-dimer returned positive at greater than 4.  However this could possibly be due to his underlying posse immune glomerulonephritis.  Initially I wanted to get a VQ scan to evaluate for pulmonary embolism.  This was ordered stat this morning.  However according to radiology because of the COVID-19 restrictions they are not allowed to perform the ventilation portion of the VQ scan making this test and effective in evaluating for pulmonary embolism.  My pretest probability is intermediate and I am unwilling to risk his renal function with a CT angiogram of the chest.  Therefore I made the decision to obtain stat ultrasounds of his lower extremities to evaluate for any evidence of DVT.  My rationale is that if the patient has a pulmonary embolism it would come from a DVT in his legs and we should see residual clot burden in his legs.  This would not affect his kidney function and we can get this test ordered stat.  I will also perform a chest x-ray to evaluate for any evidence of pneumonia or underlying pulmonary issues.  I suspect that his dyspnea is multifactorial due to his anemia, underlying cardiovascular disease.  Although not ideal, I feel that this is the safest way to rule out pulmonary embolism in an intermediate risk patient if the patient develops chest pain however I would insist on a CT angiogram.  Patient was confirmed to have a chronic DVT in his right leg and given his shortness of breath it was recommended that he go to the hospital for heparin crossover given his chronic kidney disease and hemodialysis dependency until Coumadin reached therapeutic levels.  I have copied relevant  portions of  his discharge summary below for my reference: Admit date: 10/26/2018 Discharge date: 10/27/2018  Time spent: 45 minutes  Recommendations for Outpatient Follow-up:  1. Follow up with PCP 1-2 weeks for evaluation of symptoms. Recommend cbc to track Hg and platelets 2. Dialysis per schedule   Discharge Diagnoses:  Principal Problem:   DVT (deep venous thrombosis) ([name]) Active Problems:   ESRD on dialysis ([place])   Elevated troponin   Hyperlipidemia   Essential hypertension   COPD (chronic obstructive pulmonary disease) ([name])   Type II diabetes mellitus with renal manifestations ([name])   CAD (coronary artery disease)   Pancytopenia ([name])   Hypomagnesemia   TOBACCO ABUSE   GERD (gastroesophageal reflux disease)   History of present illness:  Patient presented 4/29 with shortness of breath for more than 1 week and bilateral legpain andswelling for more than 3 weeks.He stated his right leg worse than the left.Patient denied chest pain, cough, fever or chills. He was seen by PCP 4/29, who suspected DVT and PE. He had LE doppler today showing chronic DVT.V/Q scanswas notdone due to COVID issues.CT angio of lungswas not doneas the hope is that hemay have chance torecover renal function (recently hospitalized with AKI secondary to pauci immune GN on dialysis).Patient denied any nausea vomiting, diarrhea or abdominal pain. Denied dark stool or rectal bleeding. Denied symptoms of UTI or unilateral weakness  Hospital Course:  DVT (deep venous thrombosis) ([place]): LE doppler in PCP's office showedchronic deep vein thrombosis involving the right femoral vein, and right popliteal vein. Since pt also has SOB, cannot completely rule out possibility for PE, therefore he was started on a blood thinner. Pt also with pancytopenia.His baseline hemoglobin is about 8.5 at discharge Hg 8.3. Home meds include plavix prescribed by cardiology 2012. Patient denied dark stool or rectal  bleeding. Treatment options discuss with nephrology vascular and cardiology. Nephrology ok's eliquis 21m. Vascular ok's stopping plavix from their standpoint. Cardiology stated given high risk of bleeding an antiplatelet is preferred. Recommended asa 858mwith asa and close monitoring of Hg.   Hyperlipidemia: continue lipitor andfenofibrate  Tobacco abuse: Did counseling about importance of quitting smoking  HTN: fair control. Continue home medications:Metoprolol  COPD (chronic obstructive pulmonary disease) [place] -dulera inhaler and prn albuterol inhaler  Type II diabetes mellitus with renal manifestations ([name]):Last A1c5.3 on 92/5/19, well controled. Patient is taking novologat home  CADand elevated troponin: :no CP, but trop slightly elevated but flat. In setting of ESRD, possibly due to decreased clearance. Ekg without acute changes.   GERD (gastroesophageal reflux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

## 2019-02-09 DIAGNOSIS — Z992 Dependence on renal dialysis: Secondary | ICD-10-CM | POA: Diagnosis not present

## 2019-02-09 DIAGNOSIS — N2581 Secondary hyperparathyroidism of renal origin: Secondary | ICD-10-CM | POA: Diagnosis not present

## 2019-02-09 DIAGNOSIS — N186 End stage renal disease: Secondary | ICD-10-CM | POA: Diagnosis not present

## 2019-02-11 DIAGNOSIS — Z992 Dependence on renal dialysis: Secondary | ICD-10-CM | POA: Diagnosis not present

## 2019-02-11 DIAGNOSIS — N186 End stage renal disease: Secondary | ICD-10-CM | POA: Diagnosis not present

## 2019-02-11 DIAGNOSIS — N2581 Secondary hyperparathyroidism of renal origin: Secondary | ICD-10-CM | POA: Diagnosis not present

## 2019-02-13 DIAGNOSIS — J449 Chronic obstructive pulmonary disease, unspecified: Secondary | ICD-10-CM | POA: Diagnosis not present

## 2019-02-13 DIAGNOSIS — E1142 Type 2 diabetes mellitus with diabetic polyneuropathy: Secondary | ICD-10-CM | POA: Diagnosis not present

## 2019-02-13 DIAGNOSIS — J849 Interstitial pulmonary disease, unspecified: Secondary | ICD-10-CM | POA: Diagnosis not present

## 2019-02-13 DIAGNOSIS — N186 End stage renal disease: Secondary | ICD-10-CM | POA: Diagnosis not present

## 2019-02-13 DIAGNOSIS — I82409 Acute embolism and thrombosis of unspecified deep veins of unspecified lower extremity: Secondary | ICD-10-CM | POA: Diagnosis not present

## 2019-02-13 DIAGNOSIS — E1151 Type 2 diabetes mellitus with diabetic peripheral angiopathy without gangrene: Secondary | ICD-10-CM | POA: Diagnosis not present

## 2019-02-13 DIAGNOSIS — I129 Hypertensive chronic kidney disease with stage 1 through stage 4 chronic kidney disease, or unspecified chronic kidney disease: Secondary | ICD-10-CM | POA: Diagnosis not present

## 2019-02-13 DIAGNOSIS — E1122 Type 2 diabetes mellitus with diabetic chronic kidney disease: Secondary | ICD-10-CM | POA: Diagnosis not present

## 2019-02-13 DIAGNOSIS — I2510 Atherosclerotic heart disease of native coronary artery without angina pectoris: Secondary | ICD-10-CM | POA: Diagnosis not present

## 2019-02-14 DIAGNOSIS — N186 End stage renal disease: Secondary | ICD-10-CM | POA: Diagnosis not present

## 2019-02-14 DIAGNOSIS — N2581 Secondary hyperparathyroidism of renal origin: Secondary | ICD-10-CM | POA: Diagnosis not present

## 2019-02-14 DIAGNOSIS — Z992 Dependence on renal dialysis: Secondary | ICD-10-CM | POA: Diagnosis not present

## 2019-02-16 ENCOUNTER — Telehealth: Payer: Self-pay

## 2019-02-16 DIAGNOSIS — N2581 Secondary hyperparathyroidism of renal origin: Secondary | ICD-10-CM | POA: Diagnosis not present

## 2019-02-16 DIAGNOSIS — Z992 Dependence on renal dialysis: Secondary | ICD-10-CM | POA: Diagnosis not present

## 2019-02-16 DIAGNOSIS — N186 End stage renal disease: Secondary | ICD-10-CM | POA: Diagnosis not present

## 2019-02-16 NOTE — Telephone Encounter (Signed)
Called patient back again today to see if he is interested in scheduling his sleep study. Pt states that it still isn't a good time for him. He has a lot of health problems that he is dealing with and now he has started dialysis. Pt was told to call us when ready to schedule.

## 2019-02-18 DIAGNOSIS — Z992 Dependence on renal dialysis: Secondary | ICD-10-CM | POA: Diagnosis not present

## 2019-02-18 DIAGNOSIS — N2581 Secondary hyperparathyroidism of renal origin: Secondary | ICD-10-CM | POA: Diagnosis not present

## 2019-02-18 DIAGNOSIS — N186 End stage renal disease: Secondary | ICD-10-CM | POA: Diagnosis not present

## 2019-02-20 ENCOUNTER — Encounter: Payer: Self-pay | Admitting: Internal Medicine

## 2019-02-20 ENCOUNTER — Telehealth (INDEPENDENT_AMBULATORY_CARE_PROVIDER_SITE_OTHER): Payer: Medicare HMO | Admitting: Internal Medicine

## 2019-02-20 ENCOUNTER — Other Ambulatory Visit: Payer: Self-pay

## 2019-02-20 DIAGNOSIS — N186 End stage renal disease: Secondary | ICD-10-CM | POA: Diagnosis not present

## 2019-02-20 DIAGNOSIS — I10 Essential (primary) hypertension: Secondary | ICD-10-CM

## 2019-02-20 DIAGNOSIS — I4891 Unspecified atrial fibrillation: Secondary | ICD-10-CM | POA: Diagnosis not present

## 2019-02-20 DIAGNOSIS — I2510 Atherosclerotic heart disease of native coronary artery without angina pectoris: Secondary | ICD-10-CM | POA: Diagnosis not present

## 2019-02-20 NOTE — Patient Instructions (Signed)
Medication Instructions:  No changes If you need a refill on your cardiac medications before your next appointment, please call your pharmacy.   Lab work: none If you have labs (blood work) drawn today and your tests are completely normal, you will receive your results only by: Marland Kitchen MyChart Message (if you have MyChart) OR . A paper copy in the mail If you have any lab test that is abnormal or we need to change your treatment, we will call you to review the results.  Testing/Procedures: none  Follow-Up: At [facility], you and your health needs are our priority.  As part of our continuing mission to provide you with exceptional heart care, we have created designated Provider Care Teams.  These Care Teams include your primary Cardiologist (physician) and Advanced Practice Providers (APPs -  Physician Assistants and Nurse Practitioners) who all work together to provide you with the care you need, when you need it. You will need a follow up appointment in:  6 months.  Please call our office 2 months in advance to schedule this appointment.  You may see Dorris Carnes, MD or one of the following Advanced Practice Providers on your designated Care Team: Richardson Dopp, PA-C [place], [place] . Daune Perch, NP  Any Other Special Instructions Will Be Listed Below (If Applicable).
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
## 2019-02-21 DIAGNOSIS — N2581 Secondary hyperparathyroidism of renal origin: Secondary | ICD-10-CM | POA: Diagnosis not present

## 2019-02-21 DIAGNOSIS — N186 End stage renal disease: Secondary | ICD-10-CM | POA: Diagnosis not present

## 2019-02-21 DIAGNOSIS — Z992 Dependence on renal dialysis: Secondary | ICD-10-CM | POA: Diagnosis not present

## 2019-02-22 ENCOUNTER — Ambulatory Visit (HOSPITAL_COMMUNITY)
Admission: RE | Admit: 2019-02-22 | Discharge: 2019-02-22 | Disposition: A | Discharge status: Home / Self Care | Payer: Medicare HMO | Source: Ambulatory Visit | Attending: Nephrology | Admitting: Nephrology

## 2019-02-22 ENCOUNTER — Other Ambulatory Visit: Payer: Self-pay

## 2019-02-22 ENCOUNTER — Other Ambulatory Visit (HOSPITAL_COMMUNITY): Payer: Self-pay | Admitting: Nephrology

## 2019-02-22 DIAGNOSIS — M7030 Other bursitis of elbow, unspecified elbow: Secondary | ICD-10-CM

## 2019-02-22 DIAGNOSIS — S59902A Unspecified injury of left elbow, initial encounter: Secondary | ICD-10-CM | POA: Diagnosis not present

## 2019-02-22 DIAGNOSIS — W19XXXA Unspecified fall, initial encounter: Secondary | ICD-10-CM | POA: Insufficient documentation

## 2019-02-22 DIAGNOSIS — M7032 Other bursitis of elbow, left elbow: Secondary | ICD-10-CM | POA: Diagnosis not present

## 2019-02-22 DIAGNOSIS — M25522 Pain in left elbow: Secondary | ICD-10-CM | POA: Insufficient documentation

## 2019-02-22 DIAGNOSIS — M7989 Other specified soft tissue disorders: Secondary | ICD-10-CM | POA: Diagnosis not present

## 2019-02-22 DIAGNOSIS — M25529 Pain in unspecified elbow: Secondary | ICD-10-CM

## 2019-02-23 DIAGNOSIS — N186 End stage renal disease: Secondary | ICD-10-CM | POA: Diagnosis not present

## 2019-02-23 DIAGNOSIS — N2581 Secondary hyperparathyroidism of renal origin: Secondary | ICD-10-CM | POA: Diagnosis not present

## 2019-02-23 DIAGNOSIS — Z992 Dependence on renal dialysis: Secondary | ICD-10-CM | POA: Diagnosis not present

## 2019-02-25 DIAGNOSIS — N2581 Secondary hyperparathyroidism of renal origin: Secondary | ICD-10-CM | POA: Diagnosis not present

## 2019-02-25 DIAGNOSIS — Z992 Dependence on renal dialysis: Secondary | ICD-10-CM | POA: Diagnosis not present

## 2019-02-25 DIAGNOSIS — N186 End stage renal disease: Secondary | ICD-10-CM | POA: Diagnosis not present

## 2019-02-27 DIAGNOSIS — E1122 Type 2 diabetes mellitus with diabetic chronic kidney disease: Secondary | ICD-10-CM | POA: Diagnosis not present

## 2019-02-27 DIAGNOSIS — N186 End stage renal disease: Secondary | ICD-10-CM | POA: Diagnosis not present

## 2019-02-27 DIAGNOSIS — Z992 Dependence on renal dialysis: Secondary | ICD-10-CM | POA: Diagnosis not present

## 2019-02-28 DIAGNOSIS — N186 End stage renal disease: Secondary | ICD-10-CM | POA: Diagnosis not present

## 2019-02-28 DIAGNOSIS — N2581 Secondary hyperparathyroidism of renal origin: Secondary | ICD-10-CM | POA: Diagnosis not present

## 2019-02-28 DIAGNOSIS — Z992 Dependence on renal dialysis: Secondary | ICD-10-CM | POA: Diagnosis not present

## 2019-03-02 ENCOUNTER — Other Ambulatory Visit: Payer: Self-pay

## 2019-03-02 DIAGNOSIS — N186 End stage renal disease: Secondary | ICD-10-CM | POA: Diagnosis not present

## 2019-03-02 DIAGNOSIS — N2581 Secondary hyperparathyroidism of renal origin: Secondary | ICD-10-CM | POA: Diagnosis not present

## 2019-03-02 DIAGNOSIS — Z992 Dependence on renal dialysis: Secondary | ICD-10-CM | POA: Diagnosis not present

## 2019-03-03 ENCOUNTER — Ambulatory Visit (INDEPENDENT_AMBULATORY_CARE_PROVIDER_SITE_OTHER): Payer: Medicare HMO | Admitting: Family Medicine

## 2019-03-03 ENCOUNTER — Encounter: Payer: Self-pay | Admitting: Family Medicine

## 2019-03-03 VITALS — BP 110/60 | HR 129 | Temp 97.9°F | Resp 16 | Wt 166.0 lb

## 2019-03-03 DIAGNOSIS — E441 Mild protein-calorie malnutrition: Secondary | ICD-10-CM

## 2019-03-03 DIAGNOSIS — M7022 Olecranon bursitis, left elbow: Secondary | ICD-10-CM

## 2019-03-03 DIAGNOSIS — R634 Abnormal weight loss: Secondary | ICD-10-CM

## 2019-03-03 DIAGNOSIS — R110 Nausea: Secondary | ICD-10-CM

## 2019-03-03 DIAGNOSIS — R627 Adult failure to thrive: Secondary | ICD-10-CM | POA: Diagnosis not present

## 2019-03-03 DIAGNOSIS — Z992 Dependence on renal dialysis: Secondary | ICD-10-CM | POA: Diagnosis not present

## 2019-03-03 DIAGNOSIS — N186 End stage renal disease: Secondary | ICD-10-CM | POA: Diagnosis not present

## 2019-03-03 MED ORDER — ONDANSETRON HCL 4 MG PO TABS: 4.0000 mg | ORAL_TABLET | Freq: Three times a day (TID) | ORAL | 0 refills | Status: DC | PRN

## 2019-03-03 NOTE — Progress Notes (Signed)
Subjective:    Patient ID: [name]., male    DOB: January 30, 1946, 73 y.o.   MRN: [id number]  HPI  07/19/18 At my last visit, the patient was having symptoms from her concerning for claudication.  Initially her work-up included evaluation of his peripheral vascular disease.  However it turned out the patient was having neurogenic claudication secondary to thoracic myelopathy.  Recently admitted to the hospital for back surgery.  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  Postop [name] doing very well.  Preoperative back and lower extremity pain much improved.  Standing and walking much better.  Continent of urine.  Patient feels much better and ready for discharge home.  07/19/18 Here today for follow-up.  I reviewed labs from June 02, 2018.  At that time he had a BMP which showed a creatinine of 0.99 as well as a blood sugar of 109.  Hemoglobin A1c was obtained at that point and showed excellent glycemic control at 5.3.  A CBC was obtained which was significant for a normal white blood cell count, normal hemoglobin however mild thrombocytopenia with platelet count of 106.  Patient is overdue for a fasting lipid panel.  He is taking Lipitor 80 mg a day as well as Zetia 10 mg a day.  Ideally his LDL cholesterol be less than 70.  Given his extensive cardiovascular history including significant peripheral vascular disease as well as coronary artery disease with history of stenting of his right coronary artery, patient is at high risk for congestive heart failure.  Patient states that he is doing much  better after the surgery.  The pain has improved in his legs with ambulation.  He is no longer having to use a cane or walker to get around.  However he reports severe fatigue.  He states that he sleeps sometimes 12 to 15 hours a day.  However he still feels extremely tired.  He can easily fall asleep despite getting a good night's rest the night before.  He never feels rested.  He is always sleepy.  He denies any chest pain however he does have chronic shortness of breath with exertion.  He denies any angina.  He denies any weight loss or fevers.  At that time, my plan was: Given the patient's extensive past medical history, the differential diagnosis for his fatigue and hypersomnolence is large.  The biggest concern I have is for obstructive sleep apnea.  He was scheduled for a sleep study in 2017 however I do not see where that ever occurred.  Therefore I recommended a referral for a sleep study to evaluate for obstructive sleep apnea.  This may also explain his elevated blood pressure as well.  Also given his history, I would be concerned about congestive heart failure and therefore I would recommend an echocardiogram.  The patient does have congestive heart failure, in addition to tailoring his medication, I will switch him from metformin to [place] for his diabetes.  His blood pressure is elevated today however I am concerned that his medication list is incorrect.  The patient is not certain exactly what he is taking.  Therefore of asked the patient to  go home and call us back immediately and talk to my nurse and give [place] an updated medicine list with exactly what medication he is taking we can adjust his medication appropriately to achieve control his blood pressure.  I would also check a CBC, CMP, TSH, B12.    09/23/18 My recommendations based on his labs were: Labs show mild anemia but I do not believe this is causing his fatigue. Recommend sleep study. Wt Readings from Last 3 Encounters:  02/20/19  172 lb (78 kg)  02/07/19 172 lb (78 kg)  01/30/19 175 lb (79.4 kg)   Since the patient's visit in January, he has lost 12 pounds.  Over the last few weeks he has developed epigastric pain.  It is worse as soon as he eats.  He reports nausea and vomiting.  He denies any melena or hematochezia.  He denies any bright red blood per rectum.  Pain is located in the epigastric area.  However his abdomen today is soft nondistended with normal bowel sounds and no masses other than the abdominal bruit secondary to his bypass.  Patient states however he developed severe epigastric pain as soon as he eats.  Even the thought of food makes him sick.  He denies any chest pain or shortness of breath.  He denies any jaundice.  Patient still has his gallbladder.  CT scan was performed of the abdomen and pelvis in November.  Results are dictated below: FINDINGS: Lower chest: Heart size is normal. There is no significant pericardial fluid, thickening or pericardial calcification. Calcifications of the aortic valve. There is aortic atherosclerosis, as well as atherosclerosis of the coronary arteries, including calcified atherosclerotic plaque in the left main, left anterior descending, left circumflex and right coronary arteries.  Hepatobiliary: Several small calcified granulomas are noted in the liver, including an irregular cluster of calcified granulomas in the right lobe near the dome, which appeared suspicious for potential lesion on prior examinations. However, no discrete suspicious cystic or solid hepatic lesions are identified on today's examination. No intra or extrahepatic biliary ductal dilatation. Gallbladder is normal in appearance.  Pancreas: No pancreatic mass. No pancreatic ductal dilatation. No pancreatic or peripancreatic fluid or inflammatory changes.  Spleen: Unremarkable.  Adrenals/Urinary Tract: 3 mm nonobstructive calculus in the lower pole collecting system of the left kidney.  Multiple subcentimeter low-attenuation lesions are noted in both kidneys, too small to definitively characterize, but statistically likely to represent tiny cysts. No hydroureteronephrosis in the visualized portions of the abdomen. Bilateral adrenal glands are normal in appearance.  Stomach/Bowel: Normal appearance of the stomach. No pathologic dilatation of visualized portions of small bowel or colon. Numerous colonic diverticulae are noted, without surrounding inflammatory changes to suggest an acute diverticulitis at this time.  Vascular/Lymphatic: Aortic atherosclerosis with complete occlusion of the infrarenal abdominal aorta. Reconstitution of flow in the pelvic vasculature, presumably related to the patient's partially visualized bypass graft overlying the left lower hemithorax and left anterior abdominal wall, presumably an axillary femoral bypass graft. No lymphadenopathy noted in the abdomen or pelvis.  Other: No significant volume of ascites and no pneumoperitoneum in the visualized portions of the abdomen.  Musculoskeletal: There are no aggressive appearing lytic or blastic lesions noted in the visualized portions of the skeleton.  IMPRESSION: 1. No suspicious liver lesions. The findings on the prior study appear to represent an irregular cluster of calcified granulomas. This is a benign finding. 2. Aortic atherosclerosis, in addition to left main and 3 vessel coronary artery disease. In  addition, there is complete occlusion of the infrarenal abdominal aorta. Reconstitution of flow in the pelvis presumably from the patient's left-sided bypass graft. 3. 3 mm nonobstructive calculus in the lower pole collecting system of left kidney. Although not a definitive study for the gallbladder, there was no mention of any gallstones.  At that time, my plan was: Differential diagnosis includes pancreatitis versus pancreatic neoplasm, biliary colic, intestinal ischemia,  peptic ulcer disease, gastric cancer, or medication reaction to metformin.  Patient would like to stop metformin and losartan because he believes the symptoms started right when he started these medications.  I will evaluate for pancreatic inflammation with a lipase as well as a CBC and a CMP.  I am very concerned about intestinal ischemia given his past medical history.  However I also believe he needs a GI consultation for EGD to rule out gastric cancer or peptic ulcer disease.  Obtain CT angiogram to evaluate for intestinal ischemia of the abdomen.  Also, patient has had numerous abdominal surgeries so intestinal adhesions possibly: Partial bowel obstruction would be also on the differential however I believe this is less likely given the fact his abdomen is soft and nondistended today  09/26/18 Patient's lab work returned markedly abnormal this morning.  Creatinine had risen to 4.67.  Hemoglobin had dropped to 9.8.  Given the acute renal failure, I recommended possibly going to the emergency room.  However when we called the patient, he stated that he felt better after discontinuing his medication.  At his last office visit I discontinued losartan and metformin as described above.  He states that he felt much better and was not vomiting.  Therefore we elected to try to keep the patient on the hospital and reevaluate him here today for his acute renal failure.  As discussed above, the patient states that he was unable to eat hardly anything for the last 3 weeks.  He was drinking a lot of water to try to maintain his hydration but he was not able to keep down hardly any solid food.  The patient states that he felt like he was dying last week.  However since discontinuing the metformin and the losartan, he states that the nausea and vomiting has completely stopped.  He is now able to eat and drink better.  Patient states that he is about 50% better.  His only lingering symptom is fatigue and weakness.  He states  that he just has very little energy.  At that time, my plan was: Patient wants to avoid going to the hospital especially during the coronavirus pandemic.  Therefore we will institute a work-up for acute renal failure as an outpatient as long as it is reasonable.  At the present time he has no urgent sign for dialysis.  He is not acidotic.  He is not fluid overloaded.  He has no electrolyte disturbances, he is not uremic.  Therefore the present time he is still capable of being worked up as an outpatient.  I will obtain a renal ultrasound to evaluate for any evidence of obstructive nephropathy, renal masses, etc.  I will obtain a renal artery ultrasound given the fact his acute renal failure occurred while taking losartan to rule out renal artery stenosis particular given his underlying vascular history.  I will obtain a urinalysis to evaluate for any evidence of glomerulonephritis.  I will check an SPEP to evaluate for any evidence of multiple myeloma.  However my suspicion to date as I discussed with the patient  is I believe this was multifactorial and primarily due to prerenal azotemia.  I believe his nausea and diarrhea was triggered by the metformin.  I believe this led to dehydration that caused acute renal insufficiency.  I believe then this was exacerbated by his angiotensin receptor blocker which worsened his renal insufficiency via acute tubular necrosis.  I then suspect that the patient may have developed lactic acidosis due to the metformin causing his abdominal pain.  Simply discontinuing his medications have improved his symptoms dramatically.  Therefore I will obtain a BMP to monitor his electrolytes and creatinine today.  Patient will be rehydrated with 1 L of normal saline.  Begin the work-up and recheck the patient later this week and encourage patient to push fluids over the week to improve hydration.  I plan to have the patient come in for lab work on Wednesday to repeat his BMP and then I  would like to see him back in the office on Thursday to see how he is doing.  Urinalysis today shows +3 blood +3 protein.  Is negative nitrites.  Negative leukocyte esterase.  Microscopic analysis shows 0-5 white blood cells 40-60 red blood cells 0-5 squames few bacteria no crystals no casts no yeast.  I believe this is more likely suggestive of ATN  09/27/18 Patient is here today for follow-up as planned.  He had his renal ultrasound this morning.  The results are dictated below however no hydronephrosis or obstructive nephropathy is seen: Right Kidney:  Renal measurements: 12.4 x 6.5 x 5.4 cm = volume: 229 mL . Echogenicity within normal limits. No mass or hydronephrosis visualized.  Left Kidney:  Renal measurements: 14.5 x 5.7 x 6.5 cm = volume: 279 mL. Small cysts in the left kidney, the largest 1.2 cm. Normal echotexture. No hydronephrosis.  Bladder:  Appears normal for degree of bladder distention.  Unfortunately, patient's creatinine has worsened further.  It is now up to 6.02.  More concerning for that is now the patient is demonstrating acidosis as his bicarb is dropped to 15.  He is also demonstrating possible symptoms of uremia.  He reports increasing fatigue.  His weakness has worsened since yesterday despite receiving IV fluids.  He also reports mild confusion.  For instance this morning, when he went to pay to do the ultrasound, he gave the radiologist his debit card instead of his credit card.  When they asked for his credit card, he gave him his driver's license.  Patient states that he feels more lethargic and weak today than he did yesterday.  At that time, my plan was: Unfortunately, the patient's acute renal failure seems to be worsening despite receiving IV fluids and stopping his medications.  I suspect acute tubular necrosis based on his urinalysis results.  Unfortunately he is failing outpatient conservative therapy and therefore I believe he needs admission with IV  fluids and nephrology consultation.  I do not feel this can safely be performed as an outpatient as the patient is starting to demonstrate symptoms of uremia with his mild confusion this morning and his worsening fatigue.  His lab work also suggest mild metabolic acidosis which may necessitate dialysis if he continues down this pathway.  Therefore he will go directly to the emergency room today.  We will call and notify them of his impending arrival  10/24/18 Patient was admitted to the hospital and renal was consulted ultimately the patient underwent a renal biopsy which revealed glomerulonephritis.  I have copied relevant portions of the  discharge summary and included them below for my reference:  Admit date: 09/27/2018 Discharge date: 10/20/2018  Brief/Interim Summary: 73 year old male with past medical history of diabetes mellitus, hypertension and COPD plus history of bladder cancer admitted on 3/31 after coming in with worsening renal failure after being sent over from his primary care doctor's office. Patient at that time related bouts of diarrhea for the past 3 weeks and also found to have a urinalysis with 3+ proteinuria and hematuria. Nephrology consulted and patient went renal biopsy on 10/10/18. 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 Uro was consulted and s/pCystoscopy and cleared for Rituxan infusiondue to history of recurrent bladder cancer. he has been started on dialysis on 4/10/20w Rt IJ HD cath.  4/22: OP HD has been set [facility]. At this point patient has been stabilized medically, is on regular scheduled dialysis which is set up for TTS.  We will continue on steroids, prophylactic Bactrim, follow-up with nephrology for outpatient lab monitoring and rituximab infusion and renal function  monitoring.  Discharge Diagnoses:   Acute renal failure:Status post renal biopsy 4/13 with pauci immune GNand minimal scarring,follow-up started on systemic steroid to be tapered off over 8 to 12 weeks, to cont at 60 mg/day for now, s/p rituximab IV x1 then in 24 day.on HD since 4/10,last HD 4/23. Has a right IJ dialysis catheter and is set up for outpatient dialysis TTS at [facility] starting 4/25 at 11 AM.Continue low K diet.  Discussed with the nephrologist this morning and he stable for discharge home today after dialysis.  Acute hypoxic respiratory failure from fluid overload due to renal failure.  Improved with dialysis.EF showed stable.  Pauci immuneGN:See #1.Rituximab as above, steroid and BactrimSSprophylaxis 3 times a week as per nephrology. TOC will arrange meds prior to d/c.  History of recurrent bladder cancer, seen by urology and underwent bedside cystoscopyand has noRecurrence.  Type 2 diabetes mellitus without complication,hba1c 5.3 in 05/2018. Stable mostlybut was hypoglycemic at times. Advised close monitoring of blood sugar at home  and use modified sliding scale insulin only for high blood sugar >180, since patient is on high-dose steroids and will be continued on it for some time, he is at risk for uncontrolled hyperglycemia and its complication. Patient reports that his daughter is diabetic and knows how to do that.  Patient agreed to continue on the insulin and is educated. Pt educated on hypoglycemia how to recognize and treat.    CAD/HTN/HLD:Stable with no chest pain. Decreased metoprolol to 25 given bradycardia and decrease [place].  Continue Zetia Lipitor and home Plavix  Anemia:hemoglobin stable, status post IV iron infusion, continue ESA per has a right IJ dialysis catheter  Tobacco abusecontinue nicotine patch  Patient is here today for follow-up.  Patient is accompanied by his daughter.  He is currently on Tuesday Thursday  Saturday dialysis.  He has a central line but they are currently using.  He was discharged on rapid acting insulin via sliding scale however he has not been using it as he was confused as to how to administer the insulin and when to administer the insulin.  He is also confused as to the reason his kidneys have shut down and exactly what his disease is.  Both he and his daughter have several questions.  My concern is on physical exam, the patient is tachypneic and seems to be breathing heavily.  He states  that he is always short of breath however it has worsened since he was in the hospital.  He has +1 bipedal edema in his feet and his ankles but this stops just above his ankles.  His lungs are actually clear to auscultation bilaterally today.  I appreciate no evidence of pneumonia or pulmonary edema.  He is not wheezing.  He denies any fever or chest pain or pleurisy.  However he does report shortness of breath with minimal activity.  At that time, my plan was: I spent approximately 30 minutes today with the patient and his daughter explaining his condition.  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  I explained that without his kidneys he will die.  Therefore he will be on dialysis until his kidney function hopefully improves.  I explained that the reason he is taking high-dose prednisone as well as the Rituxan is to suppress his immune system with the hopes that the kidney function will recover.  Unfortunately the prednisone will cause hypoglycemia.  Therefore of asked him to check his blood sugar before meals and before bedtime.  I wrote the patient a sliding scale direction.  He will give 0 units sugars less than 150, 2 units from 1 51-200, 4 units from 201 to 250, 6 units from 2 51-300, 8 units from 301-400, and 10 units greater than 400.  We will recheck via telephone and calculate his total insulin requirement and then make adjustments in 1  week.  He will follow-up as planned with nephrology.  Given his shortness of breath I will check a d-dimer today and if elevated will send the patient for a VQ scan to evaluate for pulmonary embolism given his prolonged immobilization and tachypnea and shortness of breath.  Addendum from 10/25/2018  Patient's d-dimer returned positive at greater than 4.  However this could possibly be due to his underlying posse immune glomerulonephritis.  Initially I wanted to get a VQ scan to evaluate for pulmonary embolism.  This was ordered stat this morning.  However according to radiology because of the COVID-19 restrictions they are not allowed to perform the ventilation portion of the VQ scan making this test and effective in evaluating for pulmonary embolism.  My pretest probability is intermediate and I am unwilling to risk his renal function with a CT angiogram of the chest.  Therefore I made the decision to obtain stat ultrasounds of his lower extremities to evaluate for any evidence of DVT.  My rationale is that if the patient has a pulmonary embolism it would come from a DVT in his legs and we should see residual clot burden in his legs.  This would not affect his kidney function and we can get this test ordered stat.  I will also perform a chest x-ray to evaluate for any evidence of pneumonia or underlying pulmonary issues.  I suspect that his dyspnea is multifactorial due to his anemia, underlying cardiovascular disease.  Although not ideal, I feel that this is the safest way to rule out pulmonary embolism in an intermediate risk patient if the patient develops chest pain however I would insist on a CT angiogram.  Patient was confirmed to have a chronic DVT in his right leg and given his shortness of breath it was recommended that he go to the hospital for heparin crossover given his chronic kidney disease and hemodialysis dependency until Coumadin reached therapeutic levels.  I have copied relevant portions of  his discharge summary below for my reference: Admit date: 10/26/2018 Discharge date: 10/27/2018  Time spent: 45 minutes  Recommendations for Outpatient Follow-up:  1. Follow up with PCP 1-2 weeks for evaluation of symptoms. Recommend cbc to track Hg and platelets 2. Dialysis per schedule   Discharge Diagnoses:  Principal Problem:   DVT (deep venous thrombosis) ([name]) Active Problems:   ESRD on dialysis ([place])   Elevated troponin   Hyperlipidemia   Essential hypertension   COPD (chronic obstructive pulmonary disease) ([name])   Type II diabetes mellitus with renal manifestations ([name])   CAD (coronary artery disease)   Pancytopenia ([name])   Hypomagnesemia   TOBACCO ABUSE   GERD (gastroesophageal reflux disease)   History of present illness:  Patient presented 4/29 with shortness of breath for more than 1 week and bilateral legpain andswelling for more than 3 weeks.He stated his right leg worse than the left.Patient denied chest pain, cough, fever or chills. He was seen by PCP 4/29, who suspected DVT and PE. He had LE doppler today showing chronic DVT.V/Q scanswas notdone due to COVID issues.CT angio of lungswas not doneas the hope is that hemay have chance torecover renal function (recently hospitalized with AKI secondary to pauci immune GN on dialysis).Patient denied any nausea vomiting, diarrhea or abdominal pain. Denied dark stool or rectal bleeding. Denied symptoms of UTI or unilateral weakness  Hospital Course:  DVT (deep venous thrombosis) ([place]): LE doppler in PCP's office showedchronic deep vein thrombosis involving the right femoral vein, and right popliteal vein. Since pt also has SOB, cannot completely rule out possibility for PE, therefore he was started on a blood thinner. Pt also with pancytopenia.His baseline hemoglobin is about 8.5 at discharge Hg 8.3. Home meds include plavix prescribed by cardiology 2012. Patient denied dark stool or rectal  bleeding. Treatment options discuss with nephrology vascular and cardiology. Nephrology ok's eliquis 72m. Vascular ok's stopping plavix from their standpoint. Cardiology stated given high risk of bleeding an antiplatelet is preferred. Recommended asa 883mwith asa and close monitoring of Hg.   Hyperlipidemia: continue lipitor andfenofibrate  Tobacco abuse: Did counseling about importance of quitting smoking  HTN: fair control. Continue home medications:Metoprolol  COPD (chronic obstructive pulmonary disease) ([place] -dulera inhaler and prn albuterol inhaler  Type II diabetes mellitus with renal manifestations ([name]):Last A1c5.3 on 92/5/19, well controled. Patient is taking novologat home  CADand elevated troponin: :no CP, but trop slightly elevated but flat. In setting of ESRD, possibly due to decreased clearance. Ekg without acute changes.   GERD (gastroesophageal reflux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

## 2019-03-03 NOTE — Addendum Note (Signed)
Addended by: Ofilia Neas R on: 03/03/2019 03:09 PM   Modules accepted: Orders

## 2019-03-03 NOTE — Addendum Note (Signed)
Addended by: Ofilia Neas R on: 03/03/2019 03:15 PM   Modules accepted: Orders

## 2019-03-04 DIAGNOSIS — Z992 Dependence on renal dialysis: Secondary | ICD-10-CM | POA: Diagnosis not present

## 2019-03-04 DIAGNOSIS — N2581 Secondary hyperparathyroidism of renal origin: Secondary | ICD-10-CM | POA: Diagnosis not present

## 2019-03-04 DIAGNOSIS — N186 End stage renal disease: Secondary | ICD-10-CM | POA: Diagnosis not present

## 2019-03-04 LAB — SYNOVIAL CELL COUNT + DIFF, W/ CRYSTALS
Basophils, %: 0 %
Eosinophils-Synovial: 0 % (ref 0–2)
Lymphocytes-Synovial Fld: 2 % (ref 0–74)
Monocyte/Macrophage: 11 % (ref 0–69)
Neutrophil, Synovial: 87 % — ABNORMAL HIGH (ref 0–24)
Synoviocytes, %: 0 % (ref 0–15)
WBC, Synovial: 4440 cells/uL — ABNORMAL HIGH (ref <150)

## 2019-03-07 ENCOUNTER — Encounter: Payer: Self-pay | Admitting: Family Medicine

## 2019-03-07 ENCOUNTER — Ambulatory Visit (INDEPENDENT_AMBULATORY_CARE_PROVIDER_SITE_OTHER): Payer: Medicare HMO | Admitting: Family Medicine

## 2019-03-07 VITALS — BP 142/74 | HR 99 | Temp 97.6°F | Resp 15 | Ht 70.0 in | Wt 167.0 lb

## 2019-03-07 DIAGNOSIS — N2581 Secondary hyperparathyroidism of renal origin: Secondary | ICD-10-CM | POA: Diagnosis not present

## 2019-03-07 DIAGNOSIS — Z23 Encounter for immunization: Secondary | ICD-10-CM | POA: Diagnosis not present

## 2019-03-07 DIAGNOSIS — N186 End stage renal disease: Secondary | ICD-10-CM | POA: Diagnosis not present

## 2019-03-07 DIAGNOSIS — M7022 Olecranon bursitis, left elbow: Secondary | ICD-10-CM | POA: Diagnosis not present

## 2019-03-07 DIAGNOSIS — Z992 Dependence on renal dialysis: Secondary | ICD-10-CM | POA: Diagnosis not present

## 2019-03-07 NOTE — Progress Notes (Signed)
Subjective:    Patient ID: [name]., male    DOB: [birth year], 73 y.o.   MRN: [id number]  HPI  07/19/18 At my last visit, the patient was having symptoms from her concerning for claudication.  Initially her work-up included evaluation of his peripheral vascular disease.  However it turned out the patient was having neurogenic claudication secondary to thoracic myelopathy.  Recently admitted to the hospital for back surgery.  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  Postop [name] doing very well.  Preoperative back and lower extremity pain much improved.  Standing and walking much better.  Continent of urine.  Patient feels much better and ready for discharge home.  07/19/18 Here today for follow-up.  I reviewed labs from June 02, 2018.  At that time he had a BMP which showed a creatinine of 0.99 as well as a blood sugar of 109.  Hemoglobin A1c was obtained at that point and showed excellent glycemic control at 5.3.  A CBC was obtained which was significant for a normal white blood cell count, normal hemoglobin however mild thrombocytopenia with platelet count of 106.  Patient is overdue for a fasting lipid panel.  He is taking Lipitor 80 mg a day as well as Zetia 10 mg a day.  Ideally his LDL cholesterol be less than 70.  Given his extensive cardiovascular history including significant peripheral vascular disease as well as coronary artery disease with history of stenting of his right coronary artery, patient is at high risk for congestive heart failure.  Patient states that he is doing much  better after the surgery.  The pain has improved in his legs with ambulation.  He is no longer having to use a cane or walker to get around.  However he reports severe fatigue.  He states that he sleeps sometimes 12 to 15 hours a day.  However he still feels extremely tired.  He can easily fall asleep despite getting a good night's rest the night before.  He never feels rested.  He is always sleepy.  He denies any chest pain however he does have chronic shortness of breath with exertion.  He denies any angina.  He denies any weight loss or fevers.  At that time, my plan was: Given the patient's extensive past medical history, the differential diagnosis for his fatigue and hypersomnolence is large.  The biggest concern I have is for obstructive sleep apnea.  He was scheduled for a sleep study in 2017 however I do not see where that ever occurred.  Therefore I recommended a referral for a sleep study to evaluate for obstructive sleep apnea.  This may also explain his elevated blood pressure as well.  Also given his history, I would be concerned about congestive heart failure and therefore I would recommend an echocardiogram.  The patient does have congestive heart failure, in addition to tailoring his medication, I will switch him from metformin to [place] for his diabetes.  His blood pressure is elevated today however I am concerned that his medication list is incorrect.  The patient is not certain exactly what he is taking.  Therefore of asked the patient to  go home and call us back immediately and talk to my nurse and give [place] an updated medicine list with exactly what medication he is taking we can adjust his medication appropriately to achieve control his blood pressure.  I would also check a CBC, CMP, TSH, B12.    09/23/18 My recommendations based on his labs were: Labs show mild anemia but I do not believe this is causing his fatigue. Recommend sleep study. Wt Readings from Last 3 Encounters:  03/07/19  167 lb (75.8 kg)  03/03/19 166 lb (75.3 kg)  02/20/19 172 lb (78 kg)   Since the patient's visit in January, he has lost 12 pounds.  Over the last few weeks he has developed epigastric pain.  It is worse as soon as he eats.  He reports nausea and vomiting.  He denies any melena or hematochezia.  He denies any bright red blood per rectum.  Pain is located in the epigastric area.  However his abdomen today is soft nondistended with normal bowel sounds and no masses other than the abdominal bruit secondary to his bypass.  Patient states however he developed severe epigastric pain as soon as he eats.  Even the thought of food makes him sick.  He denies any chest pain or shortness of breath.  He denies any jaundice.  Patient still has his gallbladder.  CT scan was performed of the abdomen and pelvis in November.  Results are dictated below: FINDINGS: Lower chest: Heart size is normal. There is no significant pericardial fluid, thickening or pericardial calcification. Calcifications of the aortic valve. There is aortic atherosclerosis, as well as atherosclerosis of the coronary arteries, including calcified atherosclerotic plaque in the left main, left anterior descending, left circumflex and right coronary arteries.  Hepatobiliary: Several small calcified granulomas are noted in the liver, including an irregular cluster of calcified granulomas in the right lobe near the dome, which appeared suspicious for potential lesion on prior examinations. However, no discrete suspicious cystic or solid hepatic lesions are identified on today's examination. No intra or extrahepatic biliary ductal dilatation. Gallbladder is normal in appearance.  Pancreas: No pancreatic mass. No pancreatic ductal dilatation. No pancreatic or peripancreatic fluid or inflammatory changes.  Spleen: Unremarkable.  Adrenals/Urinary Tract: 3 mm nonobstructive calculus in the lower pole collecting system of the left kidney.  Multiple subcentimeter low-attenuation lesions are noted in both kidneys, too small to definitively characterize, but statistically likely to represent tiny cysts. No hydroureteronephrosis in the visualized portions of the abdomen. Bilateral adrenal glands are normal in appearance.  Stomach/Bowel: Normal appearance of the stomach. No pathologic dilatation of visualized portions of small bowel or colon. Numerous colonic diverticulae are noted, without surrounding inflammatory changes to suggest an acute diverticulitis at this time.  Vascular/Lymphatic: Aortic atherosclerosis with complete occlusion of the infrarenal abdominal aorta. Reconstitution of flow in the pelvic vasculature, presumably related to the patient's partially visualized bypass graft overlying the left lower hemithorax and left anterior abdominal wall, presumably an axillary femoral bypass graft. No lymphadenopathy noted in the abdomen or pelvis.  Other: No significant volume of ascites and no pneumoperitoneum in the visualized portions of the abdomen.  Musculoskeletal: There are no aggressive appearing lytic or blastic lesions noted in the visualized portions of the skeleton.  IMPRESSION: 1. No suspicious liver lesions. The findings on the prior study appear to represent an irregular cluster of calcified granulomas. This is a benign finding. 2. Aortic atherosclerosis, in addition to left main and 3 vessel coronary artery disease. In  addition, there is complete occlusion of the infrarenal abdominal aorta. Reconstitution of flow in the pelvis presumably from the patient's left-sided bypass graft. 3. 3 mm nonobstructive calculus in the lower pole collecting system of left kidney. Although not a definitive study for the gallbladder, there was no mention of any gallstones.  At that time, my plan was: Differential diagnosis includes pancreatitis versus pancreatic neoplasm, biliary colic, intestinal ischemia,  peptic ulcer disease, gastric cancer, or medication reaction to metformin.  Patient would like to stop metformin and losartan because he believes the symptoms started right when he started these medications.  I will evaluate for pancreatic inflammation with a lipase as well as a CBC and a CMP.  I am very concerned about intestinal ischemia given his past medical history.  However I also believe he needs a GI consultation for EGD to rule out gastric cancer or peptic ulcer disease.  Obtain CT angiogram to evaluate for intestinal ischemia of the abdomen.  Also, patient has had numerous abdominal surgeries so intestinal adhesions possibly: Partial bowel obstruction would be also on the differential however I believe this is less likely given the fact his abdomen is soft and nondistended today  09/26/18 Patient's lab work returned markedly abnormal this morning.  Creatinine had risen to 4.67.  Hemoglobin had dropped to 9.8.  Given the acute renal failure, I recommended possibly going to the emergency room.  However when we called the patient, he stated that he felt better after discontinuing his medication.  At his last office visit I discontinued losartan and metformin as described above.  He states that he felt much better and was not vomiting.  Therefore we elected to try to keep the patient on the hospital and reevaluate him here today for his acute renal failure.  As discussed above, the patient states that he was unable to eat hardly anything for the last 3 weeks.  He was drinking a lot of water to try to maintain his hydration but he was not able to keep down hardly any solid food.  The patient states that he felt like he was dying last week.  However since discontinuing the metformin and the losartan, he states that the nausea and vomiting has completely stopped.  He is now able to eat and drink better.  Patient states that he is about 50% better.  His only lingering symptom is fatigue and weakness.  He states  that he just has very little energy.  At that time, my plan was: Patient wants to avoid going to the hospital especially during the coronavirus pandemic.  Therefore we will institute a work-up for acute renal failure as an outpatient as long as it is reasonable.  At the present time he has no urgent sign for dialysis.  He is not acidotic.  He is not fluid overloaded.  He has no electrolyte disturbances, he is not uremic.  Therefore the present time he is still capable of being worked up as an outpatient.  I will obtain a renal ultrasound to evaluate for any evidence of obstructive nephropathy, renal masses, etc.  I will obtain a renal artery ultrasound given the fact his acute renal failure occurred while taking losartan to rule out renal artery stenosis particular given his underlying vascular history.  I will obtain a urinalysis to evaluate for any evidence of glomerulonephritis.  I will check an SPEP to evaluate for any evidence of multiple myeloma.  However my suspicion to date as I discussed with the patient  is I believe this was multifactorial and primarily due to prerenal azotemia.  I believe his nausea and diarrhea was triggered by the metformin.  I believe this led to dehydration that caused acute renal insufficiency.  I believe then this was exacerbated by his angiotensin receptor blocker which worsened his renal insufficiency via acute tubular necrosis.  I then suspect that the patient may have developed lactic acidosis due to the metformin causing his abdominal pain.  Simply discontinuing his medications have improved his symptoms dramatically.  Therefore I will obtain a BMP to monitor his electrolytes and creatinine today.  Patient will be rehydrated with 1 L of normal saline.  Begin the work-up and recheck the patient later this week and encourage patient to push fluids over the week to improve hydration.  I plan to have the patient come in for lab work on Wednesday to repeat his BMP and then I  would like to see him back in the office on Thursday to see how he is doing.  Urinalysis today shows +3 blood +3 protein.  Is negative nitrites.  Negative leukocyte esterase.  Microscopic analysis shows 0-5 white blood cells 40-60 red blood cells 0-5 squames few bacteria no crystals no casts no yeast.  I believe this is more likely suggestive of ATN  09/27/18 Patient is here today for follow-up as planned.  He had his renal ultrasound this morning.  The results are dictated below however no hydronephrosis or obstructive nephropathy is seen: Right Kidney:  Renal measurements: 12.4 x 6.5 x 5.4 cm = volume: 229 mL . Echogenicity within normal limits. No mass or hydronephrosis visualized.  Left Kidney:  Renal measurements: 14.5 x 5.7 x 6.5 cm = volume: 279 mL. Small cysts in the left kidney, the largest 1.2 cm. Normal echotexture. No hydronephrosis.  Bladder:  Appears normal for degree of bladder distention.  Unfortunately, patient's creatinine has worsened further.  It is now up to 6.02.  More concerning for that is now the patient is demonstrating acidosis as his bicarb is dropped to 15.  He is also demonstrating possible symptoms of uremia.  He reports increasing fatigue.  His weakness has worsened since yesterday despite receiving IV fluids.  He also reports mild confusion.  For instance this morning, when he went to pay to do the ultrasound, he gave the radiologist his debit card instead of his credit card.  When they asked for his credit card, he gave him his driver's license.  Patient states that he feels more lethargic and weak today than he did yesterday.  At that time, my plan was: Unfortunately, the patient's acute renal failure seems to be worsening despite receiving IV fluids and stopping his medications.  I suspect acute tubular necrosis based on his urinalysis results.  Unfortunately he is failing outpatient conservative therapy and therefore I believe he needs admission with IV  fluids and nephrology consultation.  I do not feel this can safely be performed as an outpatient as the patient is starting to demonstrate symptoms of uremia with his mild confusion this morning and his worsening fatigue.  His lab work also suggest mild metabolic acidosis which may necessitate dialysis if he continues down this pathway.  Therefore he will go directly to the emergency room today.  We will call and notify them of his impending arrival  10/24/18 Patient was admitted to the hospital and renal was consulted ultimately the patient underwent a renal biopsy which revealed glomerulonephritis.  I have copied relevant portions of the  discharge summary and included them below for my reference:  Admit date: 09/27/2018 Discharge date: 10/20/2018  Brief/Interim Summary: 73 year old male with past medical history of diabetes mellitus, hypertension and COPD plus history of bladder cancer admitted on 3/31 after coming in with worsening renal failure after being sent over from his primary care doctor's office. Patient at that time related bouts of diarrhea for the past 3 weeks and also found to have a urinalysis with 3+ proteinuria and hematuria. Nephrology consulted and patient went renal biopsy on 10/10/18. 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 Uro was consulted and s/pCystoscopy and cleared for Rituxan infusiondue to history of recurrent bladder cancer. he has been started on dialysis on 4/10/20w Rt IJ HD cath.  4/22: OP HD has been set [place]. At this point patient has been stabilized medically, is on regular scheduled dialysis which is set up for TTS.  We will continue on steroids, prophylactic Bactrim, follow-up with nephrology for outpatient lab monitoring and rituximab infusion and renal function  monitoring.  Discharge Diagnoses:   Acute renal failure:Status post renal biopsy 4/13 with pauci immune GNand minimal scarring,follow-up started on systemic steroid to be tapered off over 8 to 12 weeks, to cont at 60 mg/day for now, s/p rituximab IV x1 then in 24 day.on HD since 4/10,last HD 4/23. Has a right IJ dialysis catheter and is set up for outpatient dialysis TTS at [facility] starting 4/25 at 11 AM.Continue low K diet.  Discussed with the nephrologist this morning and he stable for discharge home today after dialysis.  Acute hypoxic respiratory failure from fluid overload due to renal failure.  Improved with dialysis.EF showed stable.  Pauci immuneGN:See #1.Rituximab as above, steroid and BactrimSSprophylaxis 3 times a week as per nephrology. TOC will arrange meds prior to d/c.  History of recurrent bladder cancer, seen by urology and underwent bedside cystoscopyand has noRecurrence.  Type 2 diabetes mellitus without complication,hba1c 5.3 in 05/2018. Stable mostlybut was hypoglycemic at times. Advised close monitoring of blood sugar at home  and use modified sliding scale insulin only for high blood sugar >180, since patient is on high-dose steroids and will be continued on it for some time, he is at risk for uncontrolled hyperglycemia and its complication. Patient reports that his daughter is diabetic and knows how to do that.  Patient agreed to continue on the insulin and is educated. Pt educated on hypoglycemia how to recognize and treat.    CAD/HTN/HLD:Stable with no chest pain. Decreased metoprolol to 25 given bradycardia and decrease [place].  Continue Zetia Lipitor and home Plavix  Anemia:hemoglobin stable, status post IV iron infusion, continue ESA per has a right IJ dialysis catheter  Tobacco abusecontinue nicotine patch  Patient is here today for follow-up.  Patient is accompanied by his daughter.  He is currently on Tuesday Thursday  Saturday dialysis.  He has a central line but they are currently using.  He was discharged on rapid acting insulin via sliding scale however he has not been using it as he was confused as to how to administer the insulin and when to administer the insulin.  He is also confused as to the reason his kidneys have shut down and exactly what his disease is.  Both he and his daughter have several questions.  My concern is on physical exam, the patient is tachypneic and seems to be breathing heavily.  He states  that he is always short of breath however it has worsened since he was in the hospital.  He has +1 bipedal edema in his feet and his ankles but this stops just above his ankles.  His lungs are actually clear to auscultation bilaterally today.  I appreciate no evidence of pneumonia or pulmonary edema.  He is not wheezing.  He denies any fever or chest pain or pleurisy.  However he does report shortness of breath with minimal activity.  At that time, my plan was: I spent approximately 30 minutes today with the patient and his daughter explaining his condition.  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  I explained that without his kidneys he will die.  Therefore he will be on dialysis until his kidney function hopefully improves.  I explained that the reason he is taking high-dose prednisone as well as the Rituxan is to suppress his immune system with the hopes that the kidney function will recover.  Unfortunately the prednisone will cause hypoglycemia.  Therefore of asked him to check his blood sugar before meals and before bedtime.  I wrote the patient a sliding scale direction.  He will give 0 units sugars less than 150, 2 units from 1 51-200, 4 units from 201 to 250, 6 units from 2 51-300, 8 units from 301-400, and 10 units greater than 400.  We will recheck via telephone and calculate his total insulin requirement and then make adjustments in 1  week.  He will follow-up as planned with nephrology.  Given his shortness of breath I will check a d-dimer today and if elevated will send the patient for a VQ scan to evaluate for pulmonary embolism given his prolonged immobilization and tachypnea and shortness of breath.  Addendum from 10/25/2018  Patient's d-dimer returned positive at greater than 4.  However this could possibly be due to his underlying posse immune glomerulonephritis.  Initially I wanted to get a VQ scan to evaluate for pulmonary embolism.  This was ordered stat this morning.  However according to radiology because of the COVID-19 restrictions they are not allowed to perform the ventilation portion of the VQ scan making this test and effective in evaluating for pulmonary embolism.  My pretest probability is intermediate and I am unwilling to risk his renal function with a CT angiogram of the chest.  Therefore I made the decision to obtain stat ultrasounds of his lower extremities to evaluate for any evidence of DVT.  My rationale is that if the patient has a pulmonary embolism it would come from a DVT in his legs and we should see residual clot burden in his legs.  This would not affect his kidney function and we can get this test ordered stat.  I will also perform a chest x-ray to evaluate for any evidence of pneumonia or underlying pulmonary issues.  I suspect that his dyspnea is multifactorial due to his anemia, underlying cardiovascular disease.  Although not ideal, I feel that this is the safest way to rule out pulmonary embolism in an intermediate risk patient if the patient develops chest pain however I would insist on a CT angiogram.  Patient was confirmed to have a chronic DVT in his right leg and given his shortness of breath it was recommended that he go to the hospital for heparin crossover given his chronic kidney disease and hemodialysis dependency until Coumadin reached therapeutic levels.  I have copied relevant portions of  his discharge summary below for my reference: Admit date: 10/26/2018 Discharge date: 10/27/2018  Time spent: 45 minutes  Recommendations for Outpatient Follow-up:  1. Follow up with PCP 1-2 weeks for evaluation of symptoms. Recommend cbc to track Hg and platelets 2. Dialysis per schedule   Discharge Diagnoses:  Principal Problem:   DVT (deep venous thrombosis) ([name]) Active Problems:   ESRD on dialysis ([place])   Elevated troponin   Hyperlipidemia   Essential hypertension   COPD (chronic obstructive pulmonary disease) ([name])   Type II diabetes mellitus with renal manifestations ([name])   CAD (coronary artery disease)   Pancytopenia ([name])   Hypomagnesemia   TOBACCO ABUSE   GERD (gastroesophageal reflux disease)   History of present illness:  Patient presented 4/29 with shortness of breath for more than 1 week and bilateral legpain andswelling for more than 3 weeks.He stated his right leg worse than the left.Patient denied chest pain, cough, fever or chills. He was seen by PCP 4/29, who suspected DVT and PE. He had LE doppler today showing chronic DVT.V/Q scanswas notdone due to COVID issues.CT angio of lungswas not doneas the hope is that hemay have chance torecover renal function (recently hospitalized with AKI secondary to pauci immune GN on dialysis).Patient denied any nausea vomiting, diarrhea or abdominal pain. Denied dark stool or rectal bleeding. Denied symptoms of UTI or unilateral weakness  Hospital Course:  DVT (deep venous thrombosis) ([place]): LE doppler in PCP's office showedchronic deep vein thrombosis involving the right femoral vein, and right popliteal vein. Since pt also has SOB, cannot completely rule out possibility for PE, therefore he was started on a blood thinner. Pt also with pancytopenia.His baseline hemoglobin is about 8.5 at discharge Hg 8.3. Home meds include plavix prescribed by cardiology 2012. Patient denied dark stool or rectal  bleeding. Treatment options discuss with nephrology vascular and cardiology. Nephrology ok's eliquis 63m. Vascular ok's stopping plavix from their standpoint. Cardiology stated given high risk of bleeding an antiplatelet is preferred. Recommended asa 853mwith asa and close monitoring of Hg.   Hyperlipidemia: continue lipitor andfenofibrate  Tobacco abuse: Did counseling about importance of quitting smoking  HTN: fair control. Continue home medications:Metoprolol  COPD (chronic obstructive pulmonary disease) (HCD'Lo -dulera inhaler and prn albuterol inhaler  Type II diabetes mellitus with renal manifestations ([name]):Last A1c5.3 on 92/5/19, well controled. Patient is taking novologat home  CADand elevated troponin: :no CP, but trop slightly elevated but flat. In setting of ESRD, possibly due to decreased clearance. Ekg without acute changes.   GERD (gastroesophageal reflux disease):stable  ESRD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

## 2019-03-08 DIAGNOSIS — N186 End stage renal disease: Secondary | ICD-10-CM | POA: Diagnosis not present

## 2019-03-09 ENCOUNTER — Other Ambulatory Visit: Payer: Self-pay | Admitting: Family Medicine

## 2019-03-09 ENCOUNTER — Other Ambulatory Visit: Payer: Self-pay

## 2019-03-09 DIAGNOSIS — Z992 Dependence on renal dialysis: Secondary | ICD-10-CM | POA: Diagnosis not present

## 2019-03-09 DIAGNOSIS — N186 End stage renal disease: Secondary | ICD-10-CM | POA: Diagnosis not present

## 2019-03-09 DIAGNOSIS — N2581 Secondary hyperparathyroidism of renal origin: Secondary | ICD-10-CM | POA: Diagnosis not present

## 2019-03-09 LAB — ANAEROBIC AND AEROBIC CULTURE
AER RESULT:: NO GROWTH
GRAM STAIN:: NONE SEEN
MICRO NUMBER:: 849605
MICRO NUMBER:: 849606
SPECIMEN QUALITY:: ADEQUATE
SPECIMEN QUALITY:: ADEQUATE

## 2019-03-09 NOTE — Telephone Encounter (Signed)
Last filled: 02/02/2019 Last office visit:03/07/2019  Patient is completely out.

## 2019-03-10 MED ORDER — OXYCODONE-ACETAMINOPHEN 10-325 MG PO TABS: 1.0000 | ORAL_TABLET | ORAL | 0 refills | Status: DC | PRN

## 2019-03-11 DIAGNOSIS — N2581 Secondary hyperparathyroidism of renal origin: Secondary | ICD-10-CM | POA: Diagnosis not present

## 2019-03-11 DIAGNOSIS — N186 End stage renal disease: Secondary | ICD-10-CM | POA: Diagnosis not present

## 2019-03-11 DIAGNOSIS — Z992 Dependence on renal dialysis: Secondary | ICD-10-CM | POA: Diagnosis not present

## 2019-03-13 ENCOUNTER — Other Ambulatory Visit: Payer: Medicare HMO

## 2019-03-13 ENCOUNTER — Other Ambulatory Visit: Payer: Self-pay | Admitting: Gastroenterology

## 2019-03-13 DIAGNOSIS — D509 Iron deficiency anemia, unspecified: Secondary | ICD-10-CM | POA: Diagnosis not present

## 2019-03-13 DIAGNOSIS — Z86010 Personal history of colonic polyps: Secondary | ICD-10-CM | POA: Diagnosis not present

## 2019-03-14 ENCOUNTER — Other Ambulatory Visit: Payer: Self-pay | Admitting: Family Medicine

## 2019-03-14 DIAGNOSIS — N2581 Secondary hyperparathyroidism of renal origin: Secondary | ICD-10-CM | POA: Diagnosis not present

## 2019-03-14 DIAGNOSIS — Z992 Dependence on renal dialysis: Secondary | ICD-10-CM | POA: Diagnosis not present

## 2019-03-14 DIAGNOSIS — N186 End stage renal disease: Secondary | ICD-10-CM | POA: Diagnosis not present

## 2019-03-15 ENCOUNTER — Ambulatory Visit (INDEPENDENT_AMBULATORY_CARE_PROVIDER_SITE_OTHER): Payer: Medicare HMO | Admitting: Podiatry

## 2019-03-15 ENCOUNTER — Encounter: Payer: Self-pay | Admitting: Podiatry

## 2019-03-15 ENCOUNTER — Other Ambulatory Visit: Payer: Self-pay

## 2019-03-15 DIAGNOSIS — E1121 Type 2 diabetes mellitus with diabetic nephropathy: Secondary | ICD-10-CM

## 2019-03-15 DIAGNOSIS — I739 Peripheral vascular disease, unspecified: Secondary | ICD-10-CM

## 2019-03-15 DIAGNOSIS — E119 Type 2 diabetes mellitus without complications: Secondary | ICD-10-CM | POA: Diagnosis not present

## 2019-03-15 DIAGNOSIS — M722 Plantar fascial fibromatosis: Secondary | ICD-10-CM | POA: Insufficient documentation

## 2019-03-15 DIAGNOSIS — M79674 Pain in right toe(s): Secondary | ICD-10-CM

## 2019-03-15 DIAGNOSIS — Z794 Long term (current) use of insulin: Secondary | ICD-10-CM

## 2019-03-15 DIAGNOSIS — M79675 Pain in left toe(s): Secondary | ICD-10-CM | POA: Diagnosis not present

## 2019-03-15 DIAGNOSIS — B351 Tinea unguium: Secondary | ICD-10-CM

## 2019-03-15 DIAGNOSIS — T886XXA Anaphylactic reaction due to adverse effect of correct drug or medicament properly administered, initial encounter: Secondary | ICD-10-CM | POA: Insufficient documentation

## 2019-03-15 NOTE — Progress Notes (Signed)
Complaint:  Visit Type: Patient returns to my office for continued preventative foot care services. Complaint: Patient states" my nails have grown long and thick and become painful to walk and wear shoes" Patient has been diagnosed with DM with no foot complications. The patient presents for preventative foot care services. No changes to ROS. Patient says his pain is present through both arches.  Podiatric Exam: Vascular: dorsalis pedis and posterior tibial pulses are weakly  palpable bilateral. Capillary return is immediate. Temperature gradient is WNL. Skin turgor WNL  Sensorium: Diminished  Semmes Weinstein monofilament test. Normal tactile sensation bilaterally. Nail Exam: Pt has thick disfigured discolored nails with subungual debris noted bilateral entire nail hallux through fifth toenails Ulcer Exam: There is no evidence of ulcer or pre-ulcerative changes or infection. Orthopedic Exam: Muscle tone and strength are WNL. No limitations in general ROM. No crepitus or effusions noted. Foot type and digits show no abnormalities. Bony prominences are unremarkable. Pain through arch both feet. Skin: No Porokeratosis. No infection or ulcers  Diagnosis:  Onychomycosis, , Pain in right toe, pain in left toes  Treatment & Plan Procedures and Treatment: Consent by patient was obtained for treatment procedures.   Debridement of mycotic and hypertrophic toenails, 1 through 5 bilateral and clearing of subungual debris. No ulceration, no infection noted. Powerstep insoles prescibed. Return Visit-Office Procedure: Patient instructed to return to the office for a follow up visit 3 months for continued evaluation and treatment.    Gardiner Barefoot DPM

## 2019-03-16 ENCOUNTER — Other Ambulatory Visit: Payer: Self-pay | Admitting: *Deleted

## 2019-03-16 DIAGNOSIS — Z992 Dependence on renal dialysis: Secondary | ICD-10-CM | POA: Diagnosis not present

## 2019-03-16 DIAGNOSIS — N186 End stage renal disease: Secondary | ICD-10-CM | POA: Diagnosis not present

## 2019-03-16 DIAGNOSIS — N2581 Secondary hyperparathyroidism of renal origin: Secondary | ICD-10-CM | POA: Diagnosis not present

## 2019-03-17 ENCOUNTER — Ambulatory Visit
Admission: RE | Admit: 2019-03-17 | Discharge: 2019-03-17 | Disposition: A | Discharge status: Home / Self Care | Payer: Medicare HMO | Source: Ambulatory Visit | Attending: Family Medicine | Admitting: Family Medicine

## 2019-03-17 DIAGNOSIS — E441 Mild protein-calorie malnutrition: Secondary | ICD-10-CM

## 2019-03-17 DIAGNOSIS — R627 Adult failure to thrive: Secondary | ICD-10-CM

## 2019-03-17 DIAGNOSIS — R634 Abnormal weight loss: Secondary | ICD-10-CM

## 2019-03-17 DIAGNOSIS — R110 Nausea: Secondary | ICD-10-CM

## 2019-03-17 DIAGNOSIS — N3289 Other specified disorders of bladder: Secondary | ICD-10-CM | POA: Diagnosis not present

## 2019-03-17 DIAGNOSIS — N186 End stage renal disease: Secondary | ICD-10-CM

## 2019-03-17 DIAGNOSIS — Z992 Dependence on renal dialysis: Secondary | ICD-10-CM

## 2019-03-17 MED ORDER — IOPAMIDOL (ISOVUE-300) INJECTION 61%
100.0000 mL | Freq: Once | INTRAVENOUS | Status: AC | PRN
  Administered 2019-03-17: 14:00:00 100 mL via INTRAVENOUS

## 2019-03-18 DIAGNOSIS — N2581 Secondary hyperparathyroidism of renal origin: Secondary | ICD-10-CM | POA: Diagnosis not present

## 2019-03-18 DIAGNOSIS — Z992 Dependence on renal dialysis: Secondary | ICD-10-CM | POA: Diagnosis not present

## 2019-03-18 DIAGNOSIS — N186 End stage renal disease: Secondary | ICD-10-CM | POA: Diagnosis not present

## 2019-03-20 ENCOUNTER — Other Ambulatory Visit: Payer: Self-pay | Admitting: Family Medicine

## 2019-03-20 MED ORDER — APIXABAN 5 MG PO TABS: ORAL_TABLET | ORAL | 1 refills | Status: DC

## 2019-03-21 DIAGNOSIS — N2581 Secondary hyperparathyroidism of renal origin: Secondary | ICD-10-CM | POA: Diagnosis not present

## 2019-03-21 DIAGNOSIS — Z992 Dependence on renal dialysis: Secondary | ICD-10-CM | POA: Diagnosis not present

## 2019-03-21 DIAGNOSIS — N186 End stage renal disease: Secondary | ICD-10-CM | POA: Diagnosis not present

## 2019-03-23 DIAGNOSIS — N2581 Secondary hyperparathyroidism of renal origin: Secondary | ICD-10-CM | POA: Diagnosis not present

## 2019-03-23 DIAGNOSIS — N186 End stage renal disease: Secondary | ICD-10-CM | POA: Diagnosis not present

## 2019-03-23 DIAGNOSIS — Z992 Dependence on renal dialysis: Secondary | ICD-10-CM | POA: Diagnosis not present

## 2019-03-25 DIAGNOSIS — N2581 Secondary hyperparathyroidism of renal origin: Secondary | ICD-10-CM | POA: Diagnosis not present

## 2019-03-25 DIAGNOSIS — Z992 Dependence on renal dialysis: Secondary | ICD-10-CM | POA: Diagnosis not present

## 2019-03-25 DIAGNOSIS — N186 End stage renal disease: Secondary | ICD-10-CM | POA: Diagnosis not present

## 2019-03-28 ENCOUNTER — Telehealth (HOSPITAL_COMMUNITY): Payer: Self-pay | Admitting: *Deleted

## 2019-03-28 DIAGNOSIS — N2581 Secondary hyperparathyroidism of renal origin: Secondary | ICD-10-CM | POA: Diagnosis not present

## 2019-03-28 DIAGNOSIS — Z992 Dependence on renal dialysis: Secondary | ICD-10-CM | POA: Diagnosis not present

## 2019-03-28 DIAGNOSIS — N186 End stage renal disease: Secondary | ICD-10-CM | POA: Diagnosis not present

## 2019-03-28 NOTE — Telephone Encounter (Signed)
The above patient or their representative was contacted and gave the following answers to these questions:         Do you have any of the following symptoms?n  Fever                    Cough                   Shortness of breath  Do  you have any of the following other symptoms? n   muscle pain         vomiting,        diarrhea        rash         weakness        red eye        abdominal pain         bruising          bruising or bleeding              joint pain           severe headache    Have you been in contact with someone who was or has been sick in the past 2 weeks?n  Yes                 Unsure                         Unable to assess   Does the person that you were in contact with have any of the following symptoms?   Cough         shortness of breath           muscle pain         vomiting,            diarrhea            rash            weakness           fever            red eye           abdominal pain           bruising  or  bleeding                joint pain                severe headache               Have you  or someone you have been in contact with traveled internationally in th last month?         If yes, which countries?   Have you  or someone you have been in contact with traveled outside [place] in th last month?         If yes, which state and city?   COMMENTS OR ACTION PLAN FOR THIS PATIENT:         [facility]  

## 2019-03-29 ENCOUNTER — Ambulatory Visit (HOSPITAL_COMMUNITY)
Admission: RE | Admit: 2019-03-29 | Discharge: 2019-03-29 | Disposition: A | Discharge status: Home / Self Care | Payer: Medicare HMO | Source: Ambulatory Visit | Attending: Vascular Surgery | Admitting: Vascular Surgery

## 2019-03-29 ENCOUNTER — Ambulatory Visit (INDEPENDENT_AMBULATORY_CARE_PROVIDER_SITE_OTHER): Payer: Self-pay | Admitting: Physician Assistant

## 2019-03-29 ENCOUNTER — Other Ambulatory Visit: Payer: Self-pay

## 2019-03-29 VITALS — BP 181/89 | HR 91 | Temp 97.6°F | Resp 12 | Ht 70.0 in | Wt 171.9 lb

## 2019-03-29 DIAGNOSIS — N186 End stage renal disease: Secondary | ICD-10-CM | POA: Insufficient documentation

## 2019-03-29 DIAGNOSIS — E1122 Type 2 diabetes mellitus with diabetic chronic kidney disease: Secondary | ICD-10-CM | POA: Diagnosis not present

## 2019-03-29 DIAGNOSIS — Z992 Dependence on renal dialysis: Secondary | ICD-10-CM | POA: Insufficient documentation

## 2019-03-29 NOTE — Progress Notes (Addendum)
    Postoperative Access Visit   History of Present Illness   [name] Pola. is a 73 y.o. year old male who presents for postoperative follow-up for: left brachiocephalic arteriovenous fistula by Dr. Scot Dock (Date: 01/30/19).  The patient's wounds are  healed.  The patient denies steal symptoms.  The patient is able to complete their activities of daily living.  He is currently dialyzing on a Tuesday Thursday Saturday schedule in [place] via right IJ [facility] without complication.  Surgical history also significant for left axis to femoral bypass and femoral to femoral bypass by Dr. Kellie Simmering in 2012.  Patient denies any rest pain or active tissue ischemia bilateral lower extremities.   Physical Examination   Vitals:   03/29/19 1513  BP: (!) 181/89  Pulse: 91  Resp: 12  Temp: 97.6 F (36.4 C)  TempSrc: Temporal  SpO2: 97%  Weight: 171 lb 14.4 oz (78 kg)  Height: 5\' 10"  (1.778 m)   Body mass index is 24.67 kg/m.  left arm Incisions are healed, hand grip is 5/5, sensation in digits is intact, palpable thrill, bruit can be auscultated, palpable L radial pulse     Medical Decision Making   [name] Lal. is a 73 y.o. year old male who presents s/p left brachiocephalic arteriovenous fistula   Patent brachiocephalic fistula without signs or symptoms of steal syndrome  The patient's access will be ready for use 04/24/19  The patient's tunneled dialysis catheter can be removed when Nephrology is comfortable with the performance of the fistula  The patient may follow up on a prn basis   [name] Ligas PA-C [facility] Office: [phone]  Clinic MD: Dr. Scot Dock

## 2019-03-30 DIAGNOSIS — N2581 Secondary hyperparathyroidism of renal origin: Secondary | ICD-10-CM | POA: Diagnosis not present

## 2019-03-30 DIAGNOSIS — Z992 Dependence on renal dialysis: Secondary | ICD-10-CM | POA: Diagnosis not present

## 2019-03-30 DIAGNOSIS — N186 End stage renal disease: Secondary | ICD-10-CM | POA: Diagnosis not present

## 2019-03-31 DIAGNOSIS — E119 Type 2 diabetes mellitus without complications: Secondary | ICD-10-CM | POA: Diagnosis not present

## 2019-03-31 DIAGNOSIS — Z970 Presence of artificial eye: Secondary | ICD-10-CM | POA: Diagnosis not present

## 2019-03-31 DIAGNOSIS — Z794 Long term (current) use of insulin: Secondary | ICD-10-CM | POA: Diagnosis not present

## 2019-03-31 DIAGNOSIS — Z961 Presence of intraocular lens: Secondary | ICD-10-CM | POA: Diagnosis not present

## 2019-03-31 DIAGNOSIS — H524 Presbyopia: Secondary | ICD-10-CM | POA: Diagnosis not present

## 2019-04-01 ENCOUNTER — Other Ambulatory Visit: Payer: Self-pay | Admitting: Family Medicine

## 2019-04-01 DIAGNOSIS — Z992 Dependence on renal dialysis: Secondary | ICD-10-CM | POA: Diagnosis not present

## 2019-04-01 DIAGNOSIS — N2581 Secondary hyperparathyroidism of renal origin: Secondary | ICD-10-CM | POA: Diagnosis not present

## 2019-04-01 DIAGNOSIS — N186 End stage renal disease: Secondary | ICD-10-CM | POA: Diagnosis not present

## 2019-04-04 DIAGNOSIS — N186 End stage renal disease: Secondary | ICD-10-CM | POA: Diagnosis not present

## 2019-04-04 DIAGNOSIS — Z992 Dependence on renal dialysis: Secondary | ICD-10-CM | POA: Diagnosis not present

## 2019-04-04 DIAGNOSIS — N2581 Secondary hyperparathyroidism of renal origin: Secondary | ICD-10-CM | POA: Diagnosis not present

## 2019-04-06 DIAGNOSIS — Z992 Dependence on renal dialysis: Secondary | ICD-10-CM | POA: Diagnosis not present

## 2019-04-06 DIAGNOSIS — N186 End stage renal disease: Secondary | ICD-10-CM | POA: Diagnosis not present

## 2019-04-06 DIAGNOSIS — N2581 Secondary hyperparathyroidism of renal origin: Secondary | ICD-10-CM | POA: Diagnosis not present

## 2019-04-07 ENCOUNTER — Other Ambulatory Visit: Payer: Self-pay

## 2019-04-07 ENCOUNTER — Ambulatory Visit (INDEPENDENT_AMBULATORY_CARE_PROVIDER_SITE_OTHER): Payer: Medicare HMO | Admitting: Family Medicine

## 2019-04-07 ENCOUNTER — Encounter: Payer: Self-pay | Admitting: Family Medicine

## 2019-04-07 VITALS — BP 178/80 | HR 97 | Temp 98.0°F | Resp 20 | Ht 70.0 in | Wt 170.0 lb

## 2019-04-07 DIAGNOSIS — R634 Abnormal weight loss: Secondary | ICD-10-CM

## 2019-04-07 DIAGNOSIS — Z992 Dependence on renal dialysis: Secondary | ICD-10-CM

## 2019-04-07 DIAGNOSIS — R296 Repeated falls: Secondary | ICD-10-CM

## 2019-04-07 DIAGNOSIS — N186 End stage renal disease: Secondary | ICD-10-CM

## 2019-04-07 DIAGNOSIS — M6281 Muscle weakness (generalized): Secondary | ICD-10-CM | POA: Diagnosis not present

## 2019-04-07 DIAGNOSIS — N059 Unspecified nephritic syndrome with unspecified morphologic changes: Secondary | ICD-10-CM

## 2019-04-07 DIAGNOSIS — R2689 Other abnormalities of gait and mobility: Secondary | ICD-10-CM

## 2019-04-07 DIAGNOSIS — R627 Adult failure to thrive: Secondary | ICD-10-CM | POA: Diagnosis not present

## 2019-04-07 MED ORDER — TIZANIDINE HCL 2 MG PO TABS: 2.0000 mg | ORAL_TABLET | Freq: Three times a day (TID) | ORAL | 0 refills | Status: DC | PRN
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
## 2019-04-08 DIAGNOSIS — Z992 Dependence on renal dialysis: Secondary | ICD-10-CM | POA: Diagnosis not present

## 2019-04-08 DIAGNOSIS — N186 End stage renal disease: Secondary | ICD-10-CM | POA: Diagnosis not present

## 2019-04-08 DIAGNOSIS — N2581 Secondary hyperparathyroidism of renal origin: Secondary | ICD-10-CM | POA: Diagnosis not present

## 2019-04-10 ENCOUNTER — Other Ambulatory Visit: Payer: Self-pay

## 2019-04-10 MED ORDER — OXYCODONE-ACETAMINOPHEN 10-325 MG PO TABS: 1.0000 | ORAL_TABLET | ORAL | 0 refills | Status: DC | PRN

## 2019-04-10 NOTE — Telephone Encounter (Signed)
Requested Prescriptions   Pending Prescriptions Disp Refills  . oxyCODONE-acetaminophen (PERCOCET) 10-325 MG tablet 180 tablet 0    Sig: Take 1 tablet by mouth every 4 (four) hours as needed for pain.    Last OV 04/07/2019  Last written 03/10/2019

## 2019-04-11 DIAGNOSIS — N2581 Secondary hyperparathyroidism of renal origin: Secondary | ICD-10-CM | POA: Diagnosis not present

## 2019-04-11 DIAGNOSIS — N186 End stage renal disease: Secondary | ICD-10-CM | POA: Diagnosis not present

## 2019-04-11 DIAGNOSIS — Z992 Dependence on renal dialysis: Secondary | ICD-10-CM | POA: Diagnosis not present

## 2019-04-13 DIAGNOSIS — N186 End stage renal disease: Secondary | ICD-10-CM | POA: Diagnosis not present

## 2019-04-13 DIAGNOSIS — N2581 Secondary hyperparathyroidism of renal origin: Secondary | ICD-10-CM | POA: Diagnosis not present

## 2019-04-13 DIAGNOSIS — Z992 Dependence on renal dialysis: Secondary | ICD-10-CM | POA: Diagnosis not present

## 2019-04-15 DIAGNOSIS — N2581 Secondary hyperparathyroidism of renal origin: Secondary | ICD-10-CM | POA: Diagnosis not present

## 2019-04-15 DIAGNOSIS — N186 End stage renal disease: Secondary | ICD-10-CM | POA: Diagnosis not present

## 2019-04-15 DIAGNOSIS — Z992 Dependence on renal dialysis: Secondary | ICD-10-CM | POA: Diagnosis not present

## 2019-04-18 DIAGNOSIS — N186 End stage renal disease: Secondary | ICD-10-CM | POA: Diagnosis not present

## 2019-04-18 DIAGNOSIS — N2581 Secondary hyperparathyroidism of renal origin: Secondary | ICD-10-CM | POA: Diagnosis not present

## 2019-04-18 DIAGNOSIS — Z992 Dependence on renal dialysis: Secondary | ICD-10-CM | POA: Diagnosis not present

## 2019-04-20 DIAGNOSIS — N2581 Secondary hyperparathyroidism of renal origin: Secondary | ICD-10-CM | POA: Diagnosis not present

## 2019-04-20 DIAGNOSIS — Z992 Dependence on renal dialysis: Secondary | ICD-10-CM | POA: Diagnosis not present

## 2019-04-20 DIAGNOSIS — N186 End stage renal disease: Secondary | ICD-10-CM | POA: Diagnosis not present

## 2019-04-22 DIAGNOSIS — N186 End stage renal disease: Secondary | ICD-10-CM | POA: Diagnosis not present

## 2019-04-22 DIAGNOSIS — N2581 Secondary hyperparathyroidism of renal origin: Secondary | ICD-10-CM | POA: Diagnosis not present

## 2019-04-22 DIAGNOSIS — Z992 Dependence on renal dialysis: Secondary | ICD-10-CM | POA: Diagnosis not present

## 2019-04-25 ENCOUNTER — Other Ambulatory Visit (HOSPITAL_COMMUNITY)
Admission: RE | Admit: 2019-04-25 | Discharge: 2019-04-25 | Disposition: A | Discharge status: Home / Self Care | Payer: Medicare HMO | Source: Ambulatory Visit | Attending: Gastroenterology | Admitting: Gastroenterology

## 2019-04-25 DIAGNOSIS — Z01812 Encounter for preprocedural laboratory examination: Secondary | ICD-10-CM | POA: Diagnosis not present

## 2019-04-25 DIAGNOSIS — Z20828 Contact with and (suspected) exposure to other viral communicable diseases: Secondary | ICD-10-CM | POA: Insufficient documentation

## 2019-04-25 DIAGNOSIS — N186 End stage renal disease: Secondary | ICD-10-CM | POA: Diagnosis not present

## 2019-04-25 DIAGNOSIS — Z992 Dependence on renal dialysis: Secondary | ICD-10-CM | POA: Diagnosis not present

## 2019-04-25 DIAGNOSIS — N2581 Secondary hyperparathyroidism of renal origin: Secondary | ICD-10-CM | POA: Diagnosis not present

## 2019-04-26 ENCOUNTER — Encounter (HOSPITAL_COMMUNITY): Payer: Self-pay | Admitting: *Deleted

## 2019-04-26 NOTE — Progress Notes (Signed)
Pre procedure Endo call completed with Pt's stepdaughter [name] [phone]) who doses his meds and will be his driver.  Pt scheduled for dialysis 04/27/19 day prior in [place].  Request Pt copy of most recent lab work from [place]. States Pt was instructed to  stop Eliquis 7d prior to procedure and is not currently taking Toprol XL or insulin. Pt is hard of hearing and most likely will need a wheelchair upon arrival.

## 2019-04-27 ENCOUNTER — Other Ambulatory Visit: Payer: Self-pay | Admitting: Family Medicine

## 2019-04-27 DIAGNOSIS — N186 End stage renal disease: Secondary | ICD-10-CM | POA: Diagnosis not present

## 2019-04-27 DIAGNOSIS — N2581 Secondary hyperparathyroidism of renal origin: Secondary | ICD-10-CM | POA: Diagnosis not present

## 2019-04-27 DIAGNOSIS — Z992 Dependence on renal dialysis: Secondary | ICD-10-CM | POA: Diagnosis not present

## 2019-04-27 LAB — NOVEL CORONAVIRUS, NAA (HOSP ORDER, SEND-OUT TO REF LAB; TAT 18-24 HRS): SARS-CoV-2, NAA: NOT DETECTED

## 2019-04-27 NOTE — Anesthesia Preprocedure Evaluation (Addendum)
Anesthesia Evaluation  Patient identified by MRN, date of birth, ID band Patient awake    Reviewed: Allergy & Precautions, NPO status , Patient's Chart, lab work & pertinent test results  Airway Mallampati: II  TM Distance: >3 FB Neck ROM: Full    Dental  (+) Missing   Pulmonary COPD, Current Smoker,    breath sounds clear to auscultation       Cardiovascular hypertension, + CAD, + Cardiac Stents and + Peripheral Vascular Disease  + dysrhythmias Atrial Fibrillation  Rhythm:Irregular Rate:Normal     Neuro/Psych negative neurological ROS     GI/Hepatic GERD  ,Anemia ? GI source   Endo/Other  diabetes, Type 2, Insulin Dependent  Renal/GU ESRF and DialysisRenal disease     Musculoskeletal   Abdominal   Peds  Hematology  (+) anemia ,   Anesthesia Other Findings   Reproductive/Obstetrics                            Anesthesia Physical Anesthesia Plan  ASA: III  Anesthesia Plan: MAC   Post-op Pain Management:    Induction: Intravenous  PONV Risk Score and Plan: 0 and Propofol infusion, Ondansetron and Treatment may vary due to age or medical condition  Airway Management Planned: Natural Airway and Simple Face Mask  Additional Equipment:   Intra-op Plan:   Post-operative Plan:   Informed Consent: I have reviewed the patients History and Physical, chart, labs and discussed the procedure including the risks, benefits and alternatives for the proposed anesthesia with the patient or authorized representative who has indicated his/her understanding and acceptance.       Plan Discussed with:   Anesthesia Plan Comments:        Anesthesia Quick Evaluation

## 2019-04-28 ENCOUNTER — Ambulatory Visit (HOSPITAL_COMMUNITY): Payer: Medicare HMO | Admitting: Anesthesiology

## 2019-04-28 ENCOUNTER — Encounter (HOSPITAL_COMMUNITY): Payer: Self-pay | Admitting: *Deleted

## 2019-04-28 ENCOUNTER — Encounter (HOSPITAL_COMMUNITY)
Admission: RE | Disposition: A | Discharge status: Home / Self Care | Payer: Self-pay | Source: Home / Self Care | Attending: Gastroenterology

## 2019-04-28 ENCOUNTER — Other Ambulatory Visit: Payer: Self-pay

## 2019-04-28 ENCOUNTER — Ambulatory Visit (HOSPITAL_COMMUNITY)
Admission: RE | Admit: 2019-04-28 | Discharge: 2019-04-28 | Disposition: A | Discharge status: Home / Self Care | Payer: Medicare HMO | Attending: Gastroenterology | Admitting: Gastroenterology

## 2019-04-28 DIAGNOSIS — J449 Chronic obstructive pulmonary disease, unspecified: Secondary | ICD-10-CM | POA: Insufficient documentation

## 2019-04-28 DIAGNOSIS — K5730 Diverticulosis of large intestine without perforation or abscess without bleeding: Secondary | ICD-10-CM | POA: Diagnosis not present

## 2019-04-28 DIAGNOSIS — Z8249 Family history of ischemic heart disease and other diseases of the circulatory system: Secondary | ICD-10-CM | POA: Insufficient documentation

## 2019-04-28 DIAGNOSIS — Z992 Dependence on renal dialysis: Secondary | ICD-10-CM | POA: Insufficient documentation

## 2019-04-28 DIAGNOSIS — D122 Benign neoplasm of ascending colon: Secondary | ICD-10-CM | POA: Insufficient documentation

## 2019-04-28 DIAGNOSIS — Z833 Family history of diabetes mellitus: Secondary | ICD-10-CM | POA: Insufficient documentation

## 2019-04-28 DIAGNOSIS — M5104 Intervertebral disc disorders with myelopathy, thoracic region: Secondary | ICD-10-CM | POA: Insufficient documentation

## 2019-04-28 DIAGNOSIS — E1151 Type 2 diabetes mellitus with diabetic peripheral angiopathy without gangrene: Secondary | ICD-10-CM | POA: Insufficient documentation

## 2019-04-28 DIAGNOSIS — I4891 Unspecified atrial fibrillation: Secondary | ICD-10-CM | POA: Insufficient documentation

## 2019-04-28 DIAGNOSIS — N186 End stage renal disease: Secondary | ICD-10-CM | POA: Insufficient documentation

## 2019-04-28 DIAGNOSIS — I120 Hypertensive chronic kidney disease with stage 5 chronic kidney disease or end stage renal disease: Secondary | ICD-10-CM | POA: Diagnosis not present

## 2019-04-28 DIAGNOSIS — E785 Hyperlipidemia, unspecified: Secondary | ICD-10-CM | POA: Insufficient documentation

## 2019-04-28 DIAGNOSIS — Z8551 Personal history of malignant neoplasm of bladder: Secondary | ICD-10-CM | POA: Insufficient documentation

## 2019-04-28 DIAGNOSIS — Z91018 Allergy to other foods: Secondary | ICD-10-CM | POA: Insufficient documentation

## 2019-04-28 DIAGNOSIS — I2510 Atherosclerotic heart disease of native coronary artery without angina pectoris: Secondary | ICD-10-CM | POA: Diagnosis not present

## 2019-04-28 DIAGNOSIS — K635 Polyp of colon: Secondary | ICD-10-CM | POA: Diagnosis not present

## 2019-04-28 DIAGNOSIS — Z800 Family history of malignant neoplasm of digestive organs: Secondary | ICD-10-CM | POA: Insufficient documentation

## 2019-04-28 DIAGNOSIS — E1122 Type 2 diabetes mellitus with diabetic chronic kidney disease: Secondary | ICD-10-CM | POA: Diagnosis not present

## 2019-04-28 DIAGNOSIS — D123 Benign neoplasm of transverse colon: Secondary | ICD-10-CM | POA: Insufficient documentation

## 2019-04-28 DIAGNOSIS — Z1211 Encounter for screening for malignant neoplasm of colon: Secondary | ICD-10-CM | POA: Diagnosis not present

## 2019-04-28 DIAGNOSIS — Z794 Long term (current) use of insulin: Secondary | ICD-10-CM | POA: Insufficient documentation

## 2019-04-28 DIAGNOSIS — K219 Gastro-esophageal reflux disease without esophagitis: Secondary | ICD-10-CM | POA: Diagnosis not present

## 2019-04-28 DIAGNOSIS — Z86010 Personal history of colonic polyps: Secondary | ICD-10-CM | POA: Insufficient documentation

## 2019-04-28 DIAGNOSIS — K621 Rectal polyp: Secondary | ICD-10-CM | POA: Insufficient documentation

## 2019-04-28 DIAGNOSIS — D631 Anemia in chronic kidney disease: Secondary | ICD-10-CM | POA: Insufficient documentation

## 2019-04-28 DIAGNOSIS — Z9049 Acquired absence of other specified parts of digestive tract: Secondary | ICD-10-CM | POA: Insufficient documentation

## 2019-04-28 DIAGNOSIS — F17210 Nicotine dependence, cigarettes, uncomplicated: Secondary | ICD-10-CM | POA: Diagnosis not present

## 2019-04-28 DIAGNOSIS — Z885 Allergy status to narcotic agent status: Secondary | ICD-10-CM | POA: Insufficient documentation

## 2019-04-28 DIAGNOSIS — Z91013 Allergy to seafood: Secondary | ICD-10-CM | POA: Insufficient documentation

## 2019-04-28 DIAGNOSIS — D509 Iron deficiency anemia, unspecified: Secondary | ICD-10-CM | POA: Diagnosis not present

## 2019-04-28 DIAGNOSIS — Z888 Allergy status to other drugs, medicaments and biological substances status: Secondary | ICD-10-CM | POA: Insufficient documentation

## 2019-04-28 HISTORY — PX: POLYPECTOMY: SHX5525

## 2019-04-28 HISTORY — PX: COLONOSCOPY WITH PROPOFOL: SHX5780

## 2019-04-28 LAB — POCT I-STAT, CHEM 8
BUN: 39 mg/dL — ABNORMAL HIGH (ref 8–23)
Calcium, Ion: 0.98 mmol/L — ABNORMAL LOW (ref 1.15–1.40)
Chloride: 100 mmol/L (ref 98–111)
Creatinine, Ser: 4.4 mg/dL — ABNORMAL HIGH (ref 0.61–1.24)
Glucose, Bld: 90 mg/dL (ref 70–99)
HCT: 39 % (ref 39.0–52.0)
Hemoglobin: 13.3 g/dL (ref 13.0–17.0)
Potassium: 4 mmol/L (ref 3.5–5.1)
Sodium: 135 mmol/L (ref 135–145)
TCO2: 27 mmol/L (ref 22–32)

## 2019-04-28 LAB — GLUCOSE, CAPILLARY: Glucose-Capillary: 81 mg/dL (ref 70–99)

## 2019-04-28 SURGERY — COLONOSCOPY WITH PROPOFOL
Anesthesia: Monitor Anesthesia Care

## 2019-04-28 MED ORDER — LIDOCAINE HCL (CARDIAC) PF 100 MG/5ML IV SOSY
PREFILLED_SYRINGE | INTRAVENOUS | Status: DC | PRN
  Administered 2019-04-28: 100 mg via INTRATRACHEAL

## 2019-04-28 MED ORDER — PROPOFOL 10 MG/ML IV BOLUS
INTRAVENOUS | Status: DC | PRN
  Administered 2019-04-28: 30 mg via INTRAVENOUS

## 2019-04-28 MED ORDER — PHENYLEPHRINE 40 MCG/ML (10ML) SYRINGE FOR IV PUSH (FOR BLOOD PRESSURE SUPPORT)
PREFILLED_SYRINGE | INTRAVENOUS | Status: DC | PRN
  Administered 2019-04-28: 40 ug via INTRAVENOUS

## 2019-04-28 MED ORDER — SODIUM CHLORIDE 0.9 % IV SOLN
INTRAVENOUS | Status: DC
  Administered 2019-04-28: 500 mL via INTRAVENOUS

## 2019-04-28 MED ORDER — PROPOFOL 500 MG/50ML IV EMUL
INTRAVENOUS | Status: AC
  Filled 2019-04-28: qty 50

## 2019-04-28 MED ORDER — PROPOFOL 500 MG/50ML IV EMUL
INTRAVENOUS | Status: DC | PRN
  Administered 2019-04-28: 100 ug/kg/min via INTRAVENOUS

## 2019-04-28 SURGICAL SUPPLY — 22 items

## 2019-04-28 NOTE — Op Note (Addendum)
[facility] Patient Name: [name] Procedure Date: 04/28/2019 MRN: [id number] Attending MD: Carol Ada , MD Date of Birth: Nov 17, 1945 CSN: [id number] Age: 73 Admit Type: Outpatient Procedure:                Colonoscopy Indications:              High risk colon cancer surveillance: Personal                            history of colonic polyps Providers:                Carol Ada, MD, Cleda Daub, RN, Elspeth Cho                            Tech., Technician, Stephanie [place] CRNA Referring MD:              Medicines:                Propofol per Anesthesia Complications:            No immediate complications. Estimated Blood Loss:     Estimated blood loss was minimal. Procedure:                Pre-Anesthesia Assessment:                           - Prior to the procedure, a History and Physical                            was performed, and patient medications and                            allergies were reviewed. The patient's tolerance of                            previous anesthesia was also reviewed. The risks                            and benefits of the procedure and the sedation                            options and risks were discussed with the patient.                            All questions were answered, and informed consent                            was obtained. Prior Anticoagulants: The patient has                            taken Eliquis (apixaban), last dose was 7 days                            prior to procedure. ASA Grade Assessment: III - A  patient with severe systemic disease. After                            reviewing the risks and benefits, the patient was                            deemed in satisfactory condition to undergo the                            procedure.                           - Sedation was administered by an anesthesia                            professional. Deep sedation was  attained.                           After obtaining informed consent, the colonoscope                            was passed under direct vision. Throughout the                            procedure, the patient's blood pressure, pulse, and                            oxygen saturations were monitored continuously. The                            [id number] ([id number] Olympus colonoscope was                            introduced through the anus and advanced to the the                            cecum, identified by appendiceal orifice and                            ileocecal valve. The colonoscopy was performed                            without difficulty. The patient tolerated the                            procedure well. The quality of the bowel                            preparation was good. The ileocecal valve,                            appendiceal orifice, and rectum were photographed. Scope In: 7:48:06 AM Scope Out: 8:16:00 AM Scope Withdrawal Time: 0 hours 24 minutes 59 seconds  Total Procedure Duration: 0 hours 27 minutes 54 seconds  Findings:  Six sessile polyps were found in the rectum, transverse colon and       ascending colon. The polyps were 3 to 5 mm in size. These polyps were       removed with a cold snare. Resection and retrieval were complete.      Scattered small and large-mouthed diverticula were found in the entire       colon. Impression:               - Six 3 to 5 mm polyps in the rectum, in the                            transverse colon and in the ascending colon,                            removed with a cold snare. Resected and retrieved.                           - Diverticulosis in the entire examined colon. Moderate Sedation:      Not Applicable - Patient had care per Anesthesia. Recommendation:           - Patient has a contact number available for                            emergencies. The signs and symptoms of potential                             delayed complications were discussed with the                            patient. Return to normal activities tomorrow.                            Written discharge instructions were provided to the                            patient.                           - Resume previous diet.                           - Continue present medications.                           - Await pathology results.                           - Repeat colonoscopy in 3 years for surveillance,                            if it is clinically appropriate.                           - Resume Eliquis. Procedure Code(s):        --- Professional ---  45385, Colonoscopy, flexible; with removal of                            tumor(s), polyp(s), or other lesion(s) by snare                            technique Diagnosis Code(s):        --- Professional ---                           K62.1, Rectal polyp                           Z86.010, Personal history of colonic polyps                           K63.5, Polyp of colon                           K57.30, Diverticulosis of large intestine without                            perforation or abscess without bleeding CPT copyright 2019 American Medical Association. All rights reserved. The codes documented in this report are preliminary and upon coder review may  be revised to meet current compliance requirements. Carol Ada, MD Carol Ada, MD 04/28/2019 8:25:04 AM This report has been signed electronically. Number of Addenda: 0

## 2019-04-28 NOTE — Transfer of Care (Signed)
Immediate Anesthesia Transfer of Care Note  Patient: [name].  Procedure(s) Performed: COLONOSCOPY WITH PROPOFOL (N/A ) POLYPECTOMY  Patient Location: PACU and Endoscopy Unit  Anesthesia Type:MAC  Level of Consciousness: drowsy  Airway & Oxygen Therapy: Patient Spontanous Breathing and Patient connected to face mask oxygen  Post-op Assessment: Report given to RN and Post -op Vital signs reviewed and stable  Post vital signs: Reviewed and stable  Last Vitals:  Vitals Value Taken Time  BP    Temp    Pulse    Resp    SpO2      Last Pain:  Vitals:   04/28/19 0647  TempSrc: Oral  PainSc: 0-No pain         Complications: No apparent anesthesia complications
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
## 2019-04-28 NOTE — Telephone Encounter (Signed)
Ok to refill 

## 2019-04-28 NOTE — Anesthesia Postprocedure Evaluation (Signed)
Anesthesia Post Note  Patient: [name].  Procedure(s) Performed: COLONOSCOPY WITH PROPOFOL (N/A ) POLYPECTOMY     Patient location during evaluation: PACU Anesthesia Type: MAC Level of consciousness: awake and alert Pain management: pain level controlled Vital Signs Assessment: post-procedure vital signs reviewed and stable Respiratory status: spontaneous breathing, nonlabored ventilation, respiratory function stable and patient connected to nasal cannula oxygen Cardiovascular status: stable and blood pressure returned to baseline Postop Assessment: no apparent nausea or vomiting Anesthetic complications: no    Last Vitals:  Vitals:   04/28/19 0830 04/28/19 0840  BP: (!) 111/56 (!) 165/65  Pulse: 90   Resp: (!) 24 (!) 25  Temp:    SpO2: 97% 100%    Last Pain:  Vitals:   04/28/19 0840  TempSrc:   PainSc: 0-No pain                 Tiajuana Amass

## 2019-04-28 NOTE — Discharge Instructions (Signed)
YOU HAD AN ENDOSCOPIC PROCEDURE TODAY: Refer to the procedure report and other information in the discharge instructions given to you for any specific questions about what was found during the examination. If this information does not answer your questions, please call [facility] at [phone] to clarify.   YOU SHOULD EXPECT: Some feelings of bloating in the abdomen. Passage of more gas than usual. Walking can help get rid of the air that was put into your GI tract during the procedure and reduce the bloating.  DIET: Your first meal following the procedure should be a light meal and then it is ok to progress to your normal diet. A half-sandwich or bowl of soup is an example of a good first meal. Heavy or fried foods are harder to digest and may make you feel nauseous or bloated. Drink plenty of fluids but you should avoid alcoholic beverages for 24 hours. If you had an esophageal dilation, please see attached information for diet.   ACTIVITY: Your care partner should take you home directly after the procedure. You should plan to take it easy, moving slowly for the rest of the day. You can resume normal activity the day after the procedure however YOU SHOULD NOT DRIVE, use power tools, machinery or perform tasks that involve climbing or major physical exertion for 24 hours (because of the sedation medicines used during the test).   SYMPTOMS TO REPORT IMMEDIATELY: A gastroenterologist can be reached at any hour. Please call [phone]  for any of the following symptoms:   Following upper endoscopy (EGD, EUS, ERCP, esophageal dilation) Vomiting of blood or coffee ground material  New, significant abdominal pain  New, significant chest pain or pain under the shoulder blades  Painful or persistently difficult swallowing  New shortness of breath  Black, tarry-looking or red, bloody stools  FOLLOW UP:  If any biopsies were taken you will be contacted by phone or by letter within the next  1-3 weeks. Call [phone]  if you have not heard about the biopsies in 3 weeks.  Please also call with any specific questions about appointments or follow up tests.  

## 2019-04-29 DIAGNOSIS — E1122 Type 2 diabetes mellitus with diabetic chronic kidney disease: Secondary | ICD-10-CM | POA: Diagnosis not present

## 2019-04-29 DIAGNOSIS — N2581 Secondary hyperparathyroidism of renal origin: Secondary | ICD-10-CM | POA: Diagnosis not present

## 2019-04-29 DIAGNOSIS — N186 End stage renal disease: Secondary | ICD-10-CM | POA: Diagnosis not present

## 2019-04-29 DIAGNOSIS — Z992 Dependence on renal dialysis: Secondary | ICD-10-CM | POA: Diagnosis not present

## 2019-05-01 ENCOUNTER — Encounter (HOSPITAL_COMMUNITY): Payer: Self-pay | Admitting: Gastroenterology

## 2019-05-01 LAB — SURGICAL PATHOLOGY

## 2019-05-02 DIAGNOSIS — N2581 Secondary hyperparathyroidism of renal origin: Secondary | ICD-10-CM | POA: Diagnosis not present

## 2019-05-02 DIAGNOSIS — N186 End stage renal disease: Secondary | ICD-10-CM | POA: Diagnosis not present

## 2019-05-02 DIAGNOSIS — Z992 Dependence on renal dialysis: Secondary | ICD-10-CM | POA: Diagnosis not present

## 2019-05-04 DIAGNOSIS — Z992 Dependence on renal dialysis: Secondary | ICD-10-CM | POA: Diagnosis not present

## 2019-05-04 DIAGNOSIS — N186 End stage renal disease: Secondary | ICD-10-CM | POA: Diagnosis not present

## 2019-05-04 DIAGNOSIS — N2581 Secondary hyperparathyroidism of renal origin: Secondary | ICD-10-CM | POA: Diagnosis not present

## 2019-05-06 DIAGNOSIS — N2581 Secondary hyperparathyroidism of renal origin: Secondary | ICD-10-CM | POA: Diagnosis not present

## 2019-05-06 DIAGNOSIS — N186 End stage renal disease: Secondary | ICD-10-CM | POA: Diagnosis not present

## 2019-05-06 DIAGNOSIS — Z992 Dependence on renal dialysis: Secondary | ICD-10-CM | POA: Diagnosis not present

## 2019-05-07 DIAGNOSIS — N186 End stage renal disease: Secondary | ICD-10-CM | POA: Diagnosis not present

## 2019-05-09 DIAGNOSIS — N2581 Secondary hyperparathyroidism of renal origin: Secondary | ICD-10-CM | POA: Diagnosis not present

## 2019-05-09 DIAGNOSIS — Z992 Dependence on renal dialysis: Secondary | ICD-10-CM | POA: Diagnosis not present

## 2019-05-09 DIAGNOSIS — N186 End stage renal disease: Secondary | ICD-10-CM | POA: Diagnosis not present

## 2019-05-11 DIAGNOSIS — Z992 Dependence on renal dialysis: Secondary | ICD-10-CM | POA: Diagnosis not present

## 2019-05-11 DIAGNOSIS — N2581 Secondary hyperparathyroidism of renal origin: Secondary | ICD-10-CM | POA: Diagnosis not present

## 2019-05-11 DIAGNOSIS — N186 End stage renal disease: Secondary | ICD-10-CM | POA: Diagnosis not present

## 2019-05-12 DIAGNOSIS — I739 Peripheral vascular disease, unspecified: Secondary | ICD-10-CM | POA: Diagnosis not present

## 2019-05-12 DIAGNOSIS — J449 Chronic obstructive pulmonary disease, unspecified: Secondary | ICD-10-CM | POA: Diagnosis not present

## 2019-05-12 DIAGNOSIS — M1990 Unspecified osteoarthritis, unspecified site: Secondary | ICD-10-CM | POA: Diagnosis not present

## 2019-05-12 DIAGNOSIS — N186 End stage renal disease: Secondary | ICD-10-CM | POA: Diagnosis not present

## 2019-05-13 DIAGNOSIS — Z992 Dependence on renal dialysis: Secondary | ICD-10-CM | POA: Diagnosis not present

## 2019-05-13 DIAGNOSIS — N2581 Secondary hyperparathyroidism of renal origin: Secondary | ICD-10-CM | POA: Diagnosis not present

## 2019-05-13 DIAGNOSIS — N186 End stage renal disease: Secondary | ICD-10-CM | POA: Diagnosis not present

## 2019-05-15 ENCOUNTER — Other Ambulatory Visit: Payer: Self-pay | Admitting: Family Medicine

## 2019-05-15 MED ORDER — OXYCODONE-ACETAMINOPHEN 10-325 MG PO TABS: 1.0000 | ORAL_TABLET | ORAL | 0 refills | Status: DC | PRN

## 2019-05-15 NOTE — Telephone Encounter (Signed)
Patient requesting a refill on Oxycodone     LOV: 04/07/2019  LRF:   04/10/19

## 2019-05-16 ENCOUNTER — Other Ambulatory Visit: Payer: Self-pay | Admitting: Family Medicine

## 2019-05-16 DIAGNOSIS — Z992 Dependence on renal dialysis: Secondary | ICD-10-CM | POA: Diagnosis not present

## 2019-05-16 DIAGNOSIS — N186 End stage renal disease: Secondary | ICD-10-CM | POA: Diagnosis not present

## 2019-05-16 DIAGNOSIS — N2581 Secondary hyperparathyroidism of renal origin: Secondary | ICD-10-CM | POA: Diagnosis not present

## 2019-05-18 DIAGNOSIS — N2581 Secondary hyperparathyroidism of renal origin: Secondary | ICD-10-CM | POA: Diagnosis not present

## 2019-05-18 DIAGNOSIS — N186 End stage renal disease: Secondary | ICD-10-CM | POA: Diagnosis not present

## 2019-05-18 DIAGNOSIS — Z992 Dependence on renal dialysis: Secondary | ICD-10-CM | POA: Diagnosis not present

## 2019-05-19 DIAGNOSIS — Z452 Encounter for adjustment and management of vascular access device: Secondary | ICD-10-CM | POA: Diagnosis not present

## 2019-05-20 DIAGNOSIS — N186 End stage renal disease: Secondary | ICD-10-CM | POA: Diagnosis not present

## 2019-05-20 DIAGNOSIS — Z992 Dependence on renal dialysis: Secondary | ICD-10-CM | POA: Diagnosis not present

## 2019-05-20 DIAGNOSIS — N2581 Secondary hyperparathyroidism of renal origin: Secondary | ICD-10-CM | POA: Diagnosis not present

## 2019-05-21 ENCOUNTER — Other Ambulatory Visit: Payer: Self-pay | Admitting: Family Medicine

## 2019-05-22 DIAGNOSIS — N186 End stage renal disease: Secondary | ICD-10-CM | POA: Diagnosis not present

## 2019-05-22 DIAGNOSIS — Z992 Dependence on renal dialysis: Secondary | ICD-10-CM | POA: Diagnosis not present

## 2019-05-22 DIAGNOSIS — N2581 Secondary hyperparathyroidism of renal origin: Secondary | ICD-10-CM | POA: Diagnosis not present

## 2019-05-22 NOTE — Telephone Encounter (Signed)
Ok to refill 

## 2019-05-24 DIAGNOSIS — N186 End stage renal disease: Secondary | ICD-10-CM | POA: Diagnosis not present

## 2019-05-24 DIAGNOSIS — N2581 Secondary hyperparathyroidism of renal origin: Secondary | ICD-10-CM | POA: Diagnosis not present

## 2019-05-24 DIAGNOSIS — Z992 Dependence on renal dialysis: Secondary | ICD-10-CM | POA: Diagnosis not present

## 2019-05-27 DIAGNOSIS — Z992 Dependence on renal dialysis: Secondary | ICD-10-CM | POA: Diagnosis not present

## 2019-05-27 DIAGNOSIS — N2581 Secondary hyperparathyroidism of renal origin: Secondary | ICD-10-CM | POA: Diagnosis not present

## 2019-05-27 DIAGNOSIS — N186 End stage renal disease: Secondary | ICD-10-CM | POA: Diagnosis not present

## 2019-05-29 DIAGNOSIS — N186 End stage renal disease: Secondary | ICD-10-CM | POA: Diagnosis not present

## 2019-05-29 DIAGNOSIS — Z992 Dependence on renal dialysis: Secondary | ICD-10-CM | POA: Diagnosis not present

## 2019-05-29 DIAGNOSIS — E1122 Type 2 diabetes mellitus with diabetic chronic kidney disease: Secondary | ICD-10-CM | POA: Diagnosis not present

## 2019-05-30 DIAGNOSIS — N186 End stage renal disease: Secondary | ICD-10-CM | POA: Diagnosis not present

## 2019-05-30 DIAGNOSIS — N2581 Secondary hyperparathyroidism of renal origin: Secondary | ICD-10-CM | POA: Diagnosis not present

## 2019-05-30 DIAGNOSIS — Z992 Dependence on renal dialysis: Secondary | ICD-10-CM | POA: Diagnosis not present

## 2019-05-31 ENCOUNTER — Other Ambulatory Visit: Payer: Self-pay

## 2019-05-31 ENCOUNTER — Ambulatory Visit (INDEPENDENT_AMBULATORY_CARE_PROVIDER_SITE_OTHER): Payer: Medicare HMO | Admitting: Family Medicine

## 2019-05-31 ENCOUNTER — Encounter: Payer: Self-pay | Admitting: Family Medicine

## 2019-05-31 VITALS — BP 184/86 | HR 66 | Temp 98.6°F | Resp 18 | Ht 70.0 in | Wt 163.0 lb

## 2019-05-31 DIAGNOSIS — G8929 Other chronic pain: Secondary | ICD-10-CM | POA: Diagnosis not present

## 2019-05-31 DIAGNOSIS — I480 Paroxysmal atrial fibrillation: Secondary | ICD-10-CM | POA: Diagnosis not present

## 2019-05-31 DIAGNOSIS — M545 Low back pain, unspecified: Secondary | ICD-10-CM

## 2019-05-31 DIAGNOSIS — R1030 Lower abdominal pain, unspecified: Secondary | ICD-10-CM

## 2019-05-31 DIAGNOSIS — K5900 Constipation, unspecified: Secondary | ICD-10-CM | POA: Diagnosis not present

## 2019-05-31 DIAGNOSIS — I4891 Unspecified atrial fibrillation: Secondary | ICD-10-CM | POA: Diagnosis not present

## 2019-05-31 MED ORDER — LIDOCAINE 5 % EX PTCH: 1.0000 | MEDICATED_PATCH | CUTANEOUS | 1 refills | Status: DC

## 2019-05-31 MED ORDER — ONDANSETRON HCL 4 MG PO TABS: 4.0000 mg | ORAL_TABLET | Freq: Three times a day (TID) | ORAL | 0 refills | Status: DC | PRN

## 2019-05-31 MED ORDER — LINACLOTIDE 72 MCG PO CAPS: 72.0000 ug | ORAL_CAPSULE | Freq: Every day | ORAL | 0 refills | Status: DC

## 2019-05-31 MED ORDER — POLYETHYLENE GLYCOL 3350 17 GM/SCOOP PO POWD: 17.0000 g | Freq: Two times a day (BID) | ORAL | 1 refills | Status: DC | PRN

## 2019-05-31 NOTE — Addendum Note (Signed)
Addended by: Vic Blackbird F on: 05/31/2019 02:28 PM   Modules accepted: Orders

## 2019-05-31 NOTE — Assessment & Plan Note (Signed)
Chronic back pain status post surgeries.  Advised him that his Lidoderm patches likely will not be covered but he wants to try.

## 2019-05-31 NOTE — Assessment & Plan Note (Addendum)
Her fibrillation without RVR noted.  He also has hypertension but his blood pressure plummeted after he has dialysis so he is not on any medications.  I will reach out to his cardiologist about the monitor that he is supposed to be wearing.  He is on Eliquis already.

## 2019-05-31 NOTE — Progress Notes (Addendum)
Subjective:    Patient ID: [name] Sloop., male    DOB: [birth year], 74 y.o.   MRN: [id number]  Patient presents for Lower Abd Pain (x1 week- unable to have BM)  Here with her is daughter.  He has been having abdominal pain in the lower quadrants for the past week.  He has not had a bowel movement in a week.  He has a significant past medical history including multiple abdominal surgery vascular surgery where he states he has some tubing due to his bypass.  He is currently on Humira dialysis 3 times a week.  He has been taking stool softener and he took a couple of Ex-Lax but this did not help with his bowels.  He has been passing gas normally.  His appetite is decreased because of the discomfort.  He says the pain will sometimes radiate to the middle of his back.  He does not produce any significant amount of urine so no UTI symptoms.  He has not had any vomiting but has had occasional bouts of nausea.  On exam he was noted to have a regular heart rhythm.  He states that he was seen by cardiology he was supposed to have some type of monitor but never came to his home.  Cardiologist Dr. Harrington Challenger.  He denies any chest pain but has chronic bilateral shoulder pain from rotator cuff injuries.  At the end of the visit he has for Lidoderm patches for his chronic back pain been using over-the-counter lidocaine patches and want to see if he can get the prescription form he is also on chronic pain medication  Review Of Systems:  GEN- denies fatigue, fever, weight loss,weakness, recent illness HEENT- denies eye drainage, change in vision, nasal discharge, CVS- denies chest pain, palpitations RESP- denies SOB, cough, wheeze ABD- + N/V,+ change in stools, +abd pain GU- denies dysuria, hematuria, dribbling, incontinence MSK- + joint pain, muscle aches, injury Neuro- denies headache, dizziness, syncope, seizure activity       Objective:    BP (!) 184/86   Pulse 66   Temp 98.6 F (37 C) (Temporal)    Resp 18   Ht 5\' 10"  (1.778 m)   Wt 163 lb (73.9 kg)   SpO2 96%   BMI 23.39 kg/m  GEN- NAD, alert and oriented x3, ambulates with assistance and cane HEENT- PERRL, EOMI, non injected sclera, pink conjunctiva, MMM, oropharynx clear CVS-regular rhythm normal rate bruit heard RESP-CTAB ABD-NABS,soft, tender to palpation lower abdomen above the suprapubic region no rebound or guarding no CVA tenderness Rectum- normal, mild erythema around sacrum, external hemorrhoids, FOBT neg, soft stool in vault, enlarged prostate EXT- No edema Pulses- Radial2+        Assessment & Plan:      Problem List Items Addressed This Visit      Unprioritized   Atrial fibrillation ([place]) - Primary    Her fibrillation without RVR noted.  He also has hypertension but his blood pressure plummeted after he has dialysis so he is not on any medications.  I will reach out to his cardiologist about the monitor that he is supposed to be wearing.  He is on Eliquis already.        Chronic back pain     Chronic back pain status post surgeries.  Advised him that his Lidoderm patches likely will not be covered but he wants to try.      Constipation    Constipation no bowel movement in 1 week  that he does have good bowel sounds. No fecal impaction felt in the vault.  We will hold off on any imaging as he has good bowel sounds and he is passing gas.  We will have him start MiraLAX I have given him samples of this he is to drink it twice a day.  If that does not work in 2 days he is to add Linzess which I also gave him samples of the 72 mg once a day.  He can discontinue the stool softener and Ex-Lax as this has not been working.  His daughter voiced understanding.       Other Visit Diagnoses    Lower abdominal pain          Note: This dictation was prepared with Dragon dictation along with smaller phrase technology. Any transcriptional errors that result from this process are unintentional.

## 2019-05-31 NOTE — Patient Instructions (Addendum)
Take miralax twice a day  If No BM in 2 days, then add the linzess  We will contact your cardiologist Lidoderm patch sent  F/U as needed

## 2019-05-31 NOTE — Assessment & Plan Note (Signed)
Constipation no bowel movement in 1 week that he does have good bowel sounds.  We will hold off on any imaging as he has good bowel sounds and he is passing gas.  We will have him start MiraLAX I have given him samples of this he is to drink it twice a day.  If that does not work in 2 days he is to add Linzess which I also gave him samples of the 72 mg once a day.  He can discontinue the stool softener and Ex-Lax as this has not been working.  His daughter voiced understanding.

## 2019-06-01 DIAGNOSIS — N186 End stage renal disease: Secondary | ICD-10-CM | POA: Diagnosis not present

## 2019-06-01 DIAGNOSIS — N2581 Secondary hyperparathyroidism of renal origin: Secondary | ICD-10-CM | POA: Diagnosis not present

## 2019-06-01 DIAGNOSIS — Z992 Dependence on renal dialysis: Secondary | ICD-10-CM | POA: Diagnosis not present

## 2019-06-03 DIAGNOSIS — Z992 Dependence on renal dialysis: Secondary | ICD-10-CM | POA: Diagnosis not present

## 2019-06-03 DIAGNOSIS — N186 End stage renal disease: Secondary | ICD-10-CM | POA: Diagnosis not present

## 2019-06-03 DIAGNOSIS — N2581 Secondary hyperparathyroidism of renal origin: Secondary | ICD-10-CM | POA: Diagnosis not present

## 2019-06-04 ENCOUNTER — Telehealth: Payer: Self-pay | Admitting: Internal Medicine

## 2019-06-04 NOTE — Telephone Encounter (Signed)
Patient seen by PCP    Reviewed for afib    He was not symptomatic when I saw him    I would not recomm a monitor UNLESS he starts complaining of palpitaotins

## 2019-06-05 NOTE — Telephone Encounter (Signed)
There is current or past appointment or recent order for monitor in Epic.  Attempted to reach patient by phone to verify but line either would not connect or was busy.  Did not speak with patient.

## 2019-06-06 DIAGNOSIS — Z992 Dependence on renal dialysis: Secondary | ICD-10-CM | POA: Diagnosis not present

## 2019-06-06 DIAGNOSIS — N2581 Secondary hyperparathyroidism of renal origin: Secondary | ICD-10-CM | POA: Diagnosis not present

## 2019-06-06 DIAGNOSIS — N186 End stage renal disease: Secondary | ICD-10-CM | POA: Diagnosis not present

## 2019-06-08 DIAGNOSIS — N186 End stage renal disease: Secondary | ICD-10-CM | POA: Diagnosis not present

## 2019-06-08 DIAGNOSIS — Z992 Dependence on renal dialysis: Secondary | ICD-10-CM | POA: Diagnosis not present

## 2019-06-08 DIAGNOSIS — N2581 Secondary hyperparathyroidism of renal origin: Secondary | ICD-10-CM | POA: Diagnosis not present

## 2019-06-09 ENCOUNTER — Ambulatory Visit (INDEPENDENT_AMBULATORY_CARE_PROVIDER_SITE_OTHER): Payer: Medicare HMO | Admitting: Family Medicine

## 2019-06-09 ENCOUNTER — Other Ambulatory Visit: Payer: Self-pay

## 2019-06-09 ENCOUNTER — Encounter: Payer: Self-pay | Admitting: Family Medicine

## 2019-06-09 VITALS — BP 136/78 | HR 104 | Temp 96.5°F | Resp 18 | Ht 70.0 in | Wt 161.0 lb

## 2019-06-09 DIAGNOSIS — R630 Anorexia: Secondary | ICD-10-CM | POA: Diagnosis not present

## 2019-06-09 DIAGNOSIS — R1031 Right lower quadrant pain: Secondary | ICD-10-CM | POA: Diagnosis not present

## 2019-06-09 MED ORDER — HYDROMORPHONE HCL 4 MG PO TABS: 4.0000 mg | ORAL_TABLET | ORAL | 0 refills | Status: DC | PRN

## 2019-06-09 NOTE — Progress Notes (Signed)
Subjective:    Patient ID: [name]., male    DOB: October 19, 1945, 73 y.o.   MRN: [id number]  HPI  07/19/18 At my last visit, the patient was having symptoms from her concerning for claudication.  Initially her work-up included evaluation of his peripheral vascular disease.  However it turned out the patient was having neurogenic claudication secondary to thoracic myelopathy.  Recently admitted to the hospital for back surgery.  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  Postop [name] doing very well.  Preoperative back and lower extremity pain much improved.  Standing and walking much better.  Continent of urine.  Patient feels much better and ready for discharge home.  07/19/18 Here today for follow-up.  I reviewed labs from June 02, 2018.  At that time he had a BMP which showed a creatinine of 0.99 as well as a blood sugar of 109.  Hemoglobin A1c was obtained at that point and showed excellent glycemic control at 5.3.  A CBC was obtained which was significant for a normal white blood cell count, normal hemoglobin however mild thrombocytopenia with platelet count of 106.  Patient is overdue for a fasting lipid panel.  He is taking Lipitor 80 mg a day as well as Zetia 10 mg a day.  Ideally his LDL cholesterol be less than 70.  Given his extensive cardiovascular history including significant peripheral vascular disease as well as coronary artery disease with history of stenting of his right coronary artery, patient is at high risk for congestive heart failure.  Patient states that he is doing much  better after the surgery.  The pain has improved in his legs with ambulation.  He is no longer having to use a cane or walker to get around.  However he reports severe fatigue.  He states that he sleeps sometimes 12 to 15 hours a day.  However he still feels extremely tired.  He can easily fall asleep despite getting a good night's rest the night before.  He never feels rested.  He is always sleepy.  He denies any chest pain however he does have chronic shortness of breath with exertion.  He denies any angina.  He denies any weight loss or fevers.  At that time, my plan was: Given the patient's extensive past medical history, the differential diagnosis for his fatigue and hypersomnolence is large.  The biggest concern I have is for obstructive sleep apnea.  He was scheduled for a sleep study in 2017 however I do not see where that ever occurred.  Therefore I recommended a referral for a sleep study to evaluate for obstructive sleep apnea.  This may also explain his elevated blood pressure as well.  Also given his history, I would be concerned about congestive heart failure and therefore I would recommend an echocardiogram.  The patient does have congestive heart failure, in addition to tailoring his medication, I will switch him from metformin to [place] for his diabetes.  His blood pressure is elevated today however I am concerned that his medication list is incorrect.  The patient is not certain exactly what he is taking.  Therefore of asked the patient to  go home and call us back immediately and talk to my nurse and give [place] an updated medicine list with exactly what medication he is taking we can adjust his medication appropriately to achieve control his blood pressure.  I would also check a CBC, CMP, TSH, B12.    09/23/18 My recommendations based on his labs were: Labs show mild anemia but I do not believe this is causing his fatigue. Recommend sleep study. Wt Readings from Last 3 Encounters:  05/31/19  163 lb (73.9 kg)  04/28/19 165 lb (74.8 kg)  04/07/19 170 lb (77.1 kg)   Since the patient's visit in January, he has lost 12 pounds.  Over the last few weeks he has developed epigastric pain.  It is worse as soon as he eats.  He reports nausea and vomiting.  He denies any melena or hematochezia.  He denies any bright red blood per rectum.  Pain is located in the epigastric area.  However his abdomen today is soft nondistended with normal bowel sounds and no masses other than the abdominal bruit secondary to his bypass.  Patient states however he developed severe epigastric pain as soon as he eats.  Even the thought of food makes him sick.  He denies any chest pain or shortness of breath.  He denies any jaundice.  Patient still has his gallbladder.  CT scan was performed of the abdomen and pelvis in November.  Results are dictated below: FINDINGS: Lower chest: Heart size is normal. There is no significant pericardial fluid, thickening or pericardial calcification. Calcifications of the aortic valve. There is aortic atherosclerosis, as well as atherosclerosis of the coronary arteries, including calcified atherosclerotic plaque in the left main, left anterior descending, left circumflex and right coronary arteries.  Hepatobiliary: Several small calcified granulomas are noted in the liver, including an irregular cluster of calcified granulomas in the right lobe near the dome, which appeared suspicious for potential lesion on prior examinations. However, no discrete suspicious cystic or solid hepatic lesions are identified on today's examination. No intra or extrahepatic biliary ductal dilatation. Gallbladder is normal in appearance.  Pancreas: No pancreatic mass. No pancreatic ductal dilatation. No pancreatic or peripancreatic fluid or inflammatory changes.  Spleen: Unremarkable.  Adrenals/Urinary Tract: 3 mm nonobstructive calculus in the lower pole collecting system of the left kidney.  Multiple subcentimeter low-attenuation lesions are noted in both kidneys, too small to definitively characterize, but statistically likely to represent tiny cysts. No hydroureteronephrosis in the visualized portions of the abdomen. Bilateral adrenal glands are normal in appearance.  Stomach/Bowel: Normal appearance of the stomach. No pathologic dilatation of visualized portions of small bowel or colon. Numerous colonic diverticulae are noted, without surrounding inflammatory changes to suggest an acute diverticulitis at this time.  Vascular/Lymphatic: Aortic atherosclerosis with complete occlusion of the infrarenal abdominal aorta. Reconstitution of flow in the pelvic vasculature, presumably related to the patient's partially visualized bypass graft overlying the left lower hemithorax and left anterior abdominal wall, presumably an axillary femoral bypass graft. No lymphadenopathy noted in the abdomen or pelvis.  Other: No significant volume of ascites and no pneumoperitoneum in the visualized portions of the abdomen.  Musculoskeletal: There are no aggressive appearing lytic or blastic lesions noted in the visualized portions of the skeleton.  IMPRESSION: 1. No suspicious liver lesions. The findings on the prior study appear to represent an irregular cluster of calcified granulomas. This is a benign finding. 2. Aortic atherosclerosis, in addition to left main and 3 vessel coronary artery disease. In  addition, there is complete occlusion of the infrarenal abdominal aorta. Reconstitution of flow in the pelvis presumably from the patient's left-sided bypass graft. 3. 3 mm nonobstructive calculus in the lower pole collecting system of left kidney. Although not a definitive study for the gallbladder, there was no mention of any gallstones.  At that time, my plan was: Differential diagnosis includes pancreatitis versus pancreatic neoplasm, biliary colic, intestinal ischemia,  peptic ulcer disease, gastric cancer, or medication reaction to metformin.  Patient would like to stop metformin and losartan because he believes the symptoms started right when he started these medications.  I will evaluate for pancreatic inflammation with a lipase as well as a CBC and a CMP.  I am very concerned about intestinal ischemia given his past medical history.  However I also believe he needs a GI consultation for EGD to rule out gastric cancer or peptic ulcer disease.  Obtain CT angiogram to evaluate for intestinal ischemia of the abdomen.  Also, patient has had numerous abdominal surgeries so intestinal adhesions possibly: Partial bowel obstruction would be also on the differential however I believe this is less likely given the fact his abdomen is soft and nondistended today  09/26/18 Patient's lab work returned markedly abnormal this morning.  Creatinine had risen to 4.67.  Hemoglobin had dropped to 9.8.  Given the acute renal failure, I recommended possibly going to the emergency room.  However when we called the patient, he stated that he felt better after discontinuing his medication.  At his last office visit I discontinued losartan and metformin as described above.  He states that he felt much better and was not vomiting.  Therefore we elected to try to keep the patient on the hospital and reevaluate him here today for his acute renal failure.  As discussed above, the patient states that he was unable to eat hardly anything for the last 3 weeks.  He was drinking a lot of water to try to maintain his hydration but he was not able to keep down hardly any solid food.  The patient states that he felt like he was dying last week.  However since discontinuing the metformin and the losartan, he states that the nausea and vomiting has completely stopped.  He is now able to eat and drink better.  Patient states that he is about 50% better.  His only lingering symptom is fatigue and weakness.  He states  that he just has very little energy.  At that time, my plan was: Patient wants to avoid going to the hospital especially during the coronavirus pandemic.  Therefore we will institute a work-up for acute renal failure as an outpatient as long as it is reasonable.  At the present time he has no urgent sign for dialysis.  He is not acidotic.  He is not fluid overloaded.  He has no electrolyte disturbances, he is not uremic.  Therefore the present time he is still capable of being worked up as an outpatient.  I will obtain a renal ultrasound to evaluate for any evidence of obstructive nephropathy, renal masses, etc.  I will obtain a renal artery ultrasound given the fact his acute renal failure occurred while taking losartan to rule out renal artery stenosis particular given his underlying vascular history.  I will obtain a urinalysis to evaluate for any evidence of glomerulonephritis.  I will check an SPEP to evaluate for any evidence of multiple myeloma.  However my suspicion to date as I discussed with the patient  is I believe this was multifactorial and primarily due to prerenal azotemia.  I believe his nausea and diarrhea was triggered by the metformin.  I believe this led to dehydration that caused acute renal insufficiency.  I believe then this was exacerbated by his angiotensin receptor blocker which worsened his renal insufficiency via acute tubular necrosis.  I then suspect that the patient may have developed lactic acidosis due to the metformin causing his abdominal pain.  Simply discontinuing his medications have improved his symptoms dramatically.  Therefore I will obtain a BMP to monitor his electrolytes and creatinine today.  Patient will be rehydrated with 1 L of normal saline.  Begin the work-up and recheck the patient later this week and encourage patient to push fluids over the week to improve hydration.  I plan to have the patient come in for lab work on Wednesday to repeat his BMP and then I  would like to see him back in the office on Thursday to see how he is doing.  Urinalysis today shows +3 blood +3 protein.  Is negative nitrites.  Negative leukocyte esterase.  Microscopic analysis shows 0-5 white blood cells 40-60 red blood cells 0-5 squames few bacteria no crystals no casts no yeast.  I believe this is more likely suggestive of ATN  09/27/18 Patient is here today for follow-up as planned.  He had his renal ultrasound this morning.  The results are dictated below however no hydronephrosis or obstructive nephropathy is seen: Right Kidney:  Renal measurements: 12.4 x 6.5 x 5.4 cm = volume: 229 mL . Echogenicity within normal limits. No mass or hydronephrosis visualized.  Left Kidney:  Renal measurements: 14.5 x 5.7 x 6.5 cm = volume: 279 mL. Small cysts in the left kidney, the largest 1.2 cm. Normal echotexture. No hydronephrosis.  Bladder:  Appears normal for degree of bladder distention.  Unfortunately, patient's creatinine has worsened further.  It is now up to 6.02.  More concerning for that is now the patient is demonstrating acidosis as his bicarb is dropped to 15.  He is also demonstrating possible symptoms of uremia.  He reports increasing fatigue.  His weakness has worsened since yesterday despite receiving IV fluids.  He also reports mild confusion.  For instance this morning, when he went to pay to do the ultrasound, he gave the radiologist his debit card instead of his credit card.  When they asked for his credit card, he gave him his driver's license.  Patient states that he feels more lethargic and weak today than he did yesterday.  At that time, my plan was: Unfortunately, the patient's acute renal failure seems to be worsening despite receiving IV fluids and stopping his medications.  I suspect acute tubular necrosis based on his urinalysis results.  Unfortunately he is failing outpatient conservative therapy and therefore I believe he needs admission with IV  fluids and nephrology consultation.  I do not feel this can safely be performed as an outpatient as the patient is starting to demonstrate symptoms of uremia with his mild confusion this morning and his worsening fatigue.  His lab work also suggest mild metabolic acidosis which may necessitate dialysis if he continues down this pathway.  Therefore he will go directly to the emergency room today.  We will call and notify them of his impending arrival  10/24/18 Patient was admitted to the hospital and renal was consulted ultimately the patient underwent a renal biopsy which revealed glomerulonephritis.  I have copied relevant portions of the  discharge summary and included them below for my reference:  Admit date: 09/27/2018 Discharge date: 10/20/2018  Brief/Interim Summary: 73 year old male with past medical history of diabetes mellitus, hypertension and COPD plus history of bladder cancer admitted on 3/31 after coming in with worsening renal failure after being sent over from his primary care doctor's office. Patient at that time related bouts of diarrhea for the past 3 weeks and also found to have a urinalysis with 3+ proteinuria and hematuria. Nephrology consulted and patient went renal biopsy on 10/10/18. 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 Uro was consulted and s/pCystoscopy and cleared for Rituxan infusiondue to history of recurrent bladder cancer. he has been started on dialysis on 4/10/20w Rt IJ HD cath.  4/22: OP HD has been set [place]. At this point patient has been stabilized medically, is on regular scheduled dialysis which is set up for TTS.  We will continue on steroids, prophylactic Bactrim, follow-up with nephrology for outpatient lab monitoring and rituximab infusion and renal function  monitoring.  Discharge Diagnoses:   Acute renal failure:Status post renal biopsy 4/13 with pauci immune GNand minimal scarring,follow-up started on systemic steroid to be tapered off over 8 to 12 weeks, to cont at 60 mg/day for now, s/p rituximab IV x1 then in 24 day.on HD since 4/10,last HD 4/23. Has a right IJ dialysis catheter and is set up for outpatient dialysis TTS at [facility] starting 4/25 at 11 AM.Continue low K diet.  Discussed with the nephrologist this morning and he stable for discharge home today after dialysis.  Acute hypoxic respiratory failure from fluid overload due to renal failure.  Improved with dialysis.EF showed stable.  Pauci immuneGN:See #1.Rituximab as above, steroid and BactrimSSprophylaxis 3 times a week as per nephrology. TOC will arrange meds prior to d/c.  History of recurrent bladder cancer, seen by urology and underwent bedside cystoscopyand has noRecurrence.  Type 2 diabetes mellitus without complication,hba1c 5.3 in 05/2018. Stable mostlybut was hypoglycemic at times. Advised close monitoring of blood sugar at home  and use modified sliding scale insulin only for high blood sugar >180, since patient is on high-dose steroids and will be continued on it for some time, he is at risk for uncontrolled hyperglycemia and its complication. Patient reports that his daughter is diabetic and knows how to do that.  Patient agreed to continue on the insulin and is educated. Pt educated on hypoglycemia how to recognize and treat.    CAD/HTN/HLD:Stable with no chest pain. Decreased metoprolol to 25 given bradycardia and decrease [place].  Continue Zetia Lipitor and home Plavix  Anemia:hemoglobin stable, status post IV iron infusion, continue ESA per has a right IJ dialysis catheter  Tobacco abusecontinue nicotine patch  Patient is here today for follow-up.  Patient is accompanied by his daughter.  He is currently on Tuesday Thursday  Saturday dialysis.  He has a central line but they are currently using.  He was discharged on rapid acting insulin via sliding scale however he has not been using it as he was confused as to how to administer the insulin and when to administer the insulin.  He is also confused as to the reason his kidneys have shut down and exactly what his disease is.  Both he and his daughter have several questions.  My concern is on physical exam, the patient is tachypneic and seems to be breathing heavily.  He states  that he is always short of breath however it has worsened since he was in the hospital.  He has +1 bipedal edema in his feet and his ankles but this stops just above his ankles.  His lungs are actually clear to auscultation bilaterally today.  I appreciate no evidence of pneumonia or pulmonary edema.  He is not wheezing.  He denies any fever or chest pain or pleurisy.  However he does report shortness of breath with minimal activity.  At that time, my plan was: I spent approximately 30 minutes today with the patient and his daughter explaining his condition.  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  I explained that without his kidneys he will die.  Therefore he will be on dialysis until his kidney function hopefully improves.  I explained that the reason he is taking high-dose prednisone as well as the Rituxan is to suppress his immune system with the hopes that the kidney function will recover.  Unfortunately the prednisone will cause hypoglycemia.  Therefore of asked him to check his blood sugar before meals and before bedtime.  I wrote the patient a sliding scale direction.  He will give 0 units sugars less than 150, 2 units from 1 51-200, 4 units from 201 to 250, 6 units from 2 51-300, 8 units from 301-400, and 10 units greater than 400.  We will recheck via telephone and calculate his total insulin requirement and then make adjustments in 1  week.  He will follow-up as planned with nephrology.  Given his shortness of breath I will check a d-dimer today and if elevated will send the patient for a VQ scan to evaluate for pulmonary embolism given his prolonged immobilization and tachypnea and shortness of breath.  Addendum from 10/25/2018  Patient's d-dimer returned positive at greater than 4.  However this could possibly be due to his underlying posse immune glomerulonephritis.  Initially I wanted to get a VQ scan to evaluate for pulmonary embolism.  This was ordered stat this morning.  However according to radiology because of the COVID-19 restrictions they are not allowed to perform the ventilation portion of the VQ scan making this test and effective in evaluating for pulmonary embolism.  My pretest probability is intermediate and I am unwilling to risk his renal function with a CT angiogram of the chest.  Therefore I made the decision to obtain stat ultrasounds of his lower extremities to evaluate for any evidence of DVT.  My rationale is that if the patient has a pulmonary embolism it would come from a DVT in his legs and we should see residual clot burden in his legs.  This would not affect his kidney function and we can get this test ordered stat.  I will also perform a chest x-ray to evaluate for any evidence of pneumonia or underlying pulmonary issues.  I suspect that his dyspnea is multifactorial due to his anemia, underlying cardiovascular disease.  Although not ideal, I feel that this is the safest way to rule out pulmonary embolism in an intermediate risk patient if the patient develops chest pain however I would insist on a CT angiogram.  Patient was confirmed to have a chronic DVT in his right leg and given his shortness of breath it was recommended that he go to the hospital for heparin crossover given his chronic kidney disease and hemodialysis dependency until Coumadin reached therapeutic levels.  I have copied relevant portions of  his discharge summary below for my reference: Admit date: 10/26/2018 Discharge date: 10/27/2018  Time spent: 45 minutes  Recommendations for Outpatient Follow-up:  1. Follow up with PCP 1-2 weeks for evaluation of symptoms. Recommend cbc to track Hg and platelets 2. Dialysis per schedule   Discharge Diagnoses:  Principal Problem:   DVT (deep venous thrombosis) ([name]) Active Problems:   ESRD on dialysis ([place])   Elevated troponin   Hyperlipidemia   Essential hypertension   COPD (chronic obstructive pulmonary disease) ([name])   Type II diabetes mellitus with renal manifestations ([name])   CAD (coronary artery disease)   Pancytopenia ([name])   Hypomagnesemia   TOBACCO ABUSE   GERD (gastroesophageal reflux disease)   History of present illness:  Patient presented 4/29 with shortness of breath for more than 1 week and bilateral legpain andswelling for more than 3 weeks.He stated his right leg worse than the left.Patient denied chest pain, cough, fever or chills. He was seen by PCP 4/29, who suspected DVT and PE. He had LE doppler today showing chronic DVT.V/Q scanswas notdone due to COVID issues.CT angio of lungswas not doneas the hope is that hemay have chance torecover renal function (recently hospitalized with AKI secondary to pauci immune GN on dialysis).Patient denied any nausea vomiting, diarrhea or abdominal pain. Denied dark stool or rectal bleeding. Denied symptoms of UTI or unilateral weakness  Hospital Course:  DVT (deep venous thrombosis) ([employer]): LE doppler in PCP's office showedchronic deep vein thrombosis involving the right femoral vein, and right popliteal vein. Since pt also has SOB, cannot completely rule out possibility for PE, therefore he was started on a blood thinner. Pt also with pancytopenia.His baseline hemoglobin is about 8.5 at discharge Hg 8.3. Home meds include plavix prescribed by cardiology 2012. Patient denied dark stool or rectal  bleeding. Treatment options discuss with nephrology vascular and cardiology. Nephrology ok's eliquis 1m. Vascular ok's stopping plavix from their standpoint. Cardiology stated given high risk of bleeding an antiplatelet is preferred. Recommended asa 862mwith asa and close monitoring of Hg.   Hyperlipidemia: continue lipitor andfenofibrate  Tobacco abuse: Did counseling about importance of quitting smoking  HTN: fair control. Continue home medications:Metoprolol  COPD (chronic obstructive pulmonary disease) ([facility] -dulera inhaler and prn albuterol inhaler  Type II diabetes mellitus with renal manifestations ([name]):Last A1c5.3 on 92/5/19, well controled. Patient is taking novologat home  CADand elevated troponin: :no CP, but trop slightly elevated but flat. In setting of ESRD, possibly due to decreased clearance. Ekg without acute changes.   GERD (gastroesophageal reflux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

## 2019-06-10 DIAGNOSIS — N186 End stage renal disease: Secondary | ICD-10-CM | POA: Diagnosis not present

## 2019-06-10 DIAGNOSIS — N2581 Secondary hyperparathyroidism of renal origin: Secondary | ICD-10-CM | POA: Diagnosis not present

## 2019-06-10 DIAGNOSIS — Z992 Dependence on renal dialysis: Secondary | ICD-10-CM | POA: Diagnosis not present

## 2019-06-11 DIAGNOSIS — M1990 Unspecified osteoarthritis, unspecified site: Secondary | ICD-10-CM | POA: Diagnosis not present

## 2019-06-11 DIAGNOSIS — J449 Chronic obstructive pulmonary disease, unspecified: Secondary | ICD-10-CM | POA: Diagnosis not present

## 2019-06-11 DIAGNOSIS — I739 Peripheral vascular disease, unspecified: Secondary | ICD-10-CM | POA: Diagnosis not present

## 2019-06-11 DIAGNOSIS — N186 End stage renal disease: Secondary | ICD-10-CM | POA: Diagnosis not present

## 2019-06-13 DIAGNOSIS — N186 End stage renal disease: Secondary | ICD-10-CM | POA: Diagnosis not present

## 2019-06-13 DIAGNOSIS — N2581 Secondary hyperparathyroidism of renal origin: Secondary | ICD-10-CM | POA: Diagnosis not present

## 2019-06-13 DIAGNOSIS — Z992 Dependence on renal dialysis: Secondary | ICD-10-CM | POA: Diagnosis not present

## 2019-06-14 ENCOUNTER — Ambulatory Visit: Payer: Medicare HMO | Admitting: Podiatry

## 2019-06-14 ENCOUNTER — Telehealth: Payer: Self-pay | Admitting: Family Medicine

## 2019-06-14 DIAGNOSIS — R1031 Right lower quadrant pain: Secondary | ICD-10-CM

## 2019-06-14 NOTE — Telephone Encounter (Addendum)
Pt called and states that the Dilaudid is no help at all. It makes him feel drunk and he does not want to take it more. He would like a refill on his oxycodone if possible? He would also like a referral to heart and vascular for his AAA?

## 2019-06-15 ENCOUNTER — Other Ambulatory Visit: Payer: Self-pay | Admitting: Family Medicine

## 2019-06-15 DIAGNOSIS — N2581 Secondary hyperparathyroidism of renal origin: Secondary | ICD-10-CM | POA: Diagnosis not present

## 2019-06-15 DIAGNOSIS — Z992 Dependence on renal dialysis: Secondary | ICD-10-CM | POA: Diagnosis not present

## 2019-06-15 DIAGNOSIS — N186 End stage renal disease: Secondary | ICD-10-CM | POA: Diagnosis not present

## 2019-06-15 MED ORDER — OXYCODONE-ACETAMINOPHEN 10-325 MG PO TABS: 1.0000 | ORAL_TABLET | ORAL | 0 refills | Status: DC | PRN

## 2019-06-15 NOTE — Telephone Encounter (Signed)
I refilled the patient's Percocet.  Tell him to discontinue his Dilaudid.  He wants a referral to vascular surgery due to pain that he is having at his axillary femoral bypass graft.  He is extremely tender to palpation along the graft in his upper pelvis.  I am not sure if there is anything they can do to help with this because I believe it scar tissue around the graft however I am fine with a consultation to vascular surgery for a second opinion.

## 2019-06-15 NOTE — Telephone Encounter (Signed)
Pt's daughter aware and referral placed

## 2019-06-17 DIAGNOSIS — Z992 Dependence on renal dialysis: Secondary | ICD-10-CM | POA: Diagnosis not present

## 2019-06-17 DIAGNOSIS — N186 End stage renal disease: Secondary | ICD-10-CM | POA: Diagnosis not present

## 2019-06-17 DIAGNOSIS — N2581 Secondary hyperparathyroidism of renal origin: Secondary | ICD-10-CM | POA: Diagnosis not present

## 2019-06-20 DIAGNOSIS — N2581 Secondary hyperparathyroidism of renal origin: Secondary | ICD-10-CM | POA: Diagnosis not present

## 2019-06-20 DIAGNOSIS — N186 End stage renal disease: Secondary | ICD-10-CM | POA: Diagnosis not present

## 2019-06-20 DIAGNOSIS — Z992 Dependence on renal dialysis: Secondary | ICD-10-CM | POA: Diagnosis not present

## 2019-06-21 ENCOUNTER — Other Ambulatory Visit: Payer: Self-pay | Admitting: Family Medicine

## 2019-06-21 ENCOUNTER — Telehealth: Payer: Self-pay | Admitting: Family Medicine

## 2019-06-21 NOTE — Telephone Encounter (Signed)
Patient's Daughter called today stating that patient is still in severe pain in his RLQ. I called and checked on his referral with Vascular and it is still in review. She states that she also called and was told that it could be 7-10 days before they here back on this referral which it was sent on the 12/18. Patient has told her he would call the EMS if pain continued. She is trying to prevent him from going to ER and wants to know if you recommend anything else for pain? I did inform her that ER is probably the best choice is pain is severe but she is wanting him to avoid due to COVID. Please advise?

## 2019-06-21 NOTE — Telephone Encounter (Signed)
Needs to go to er, I do not know why the pain from scar tissue would be severe, may have an obstruction or ischemic bowel.  Go to er.

## 2019-06-21 NOTE — Telephone Encounter (Signed)
Pt's daughter aware.

## 2019-06-22 DIAGNOSIS — Z992 Dependence on renal dialysis: Secondary | ICD-10-CM | POA: Diagnosis not present

## 2019-06-22 DIAGNOSIS — N2581 Secondary hyperparathyroidism of renal origin: Secondary | ICD-10-CM | POA: Diagnosis not present

## 2019-06-22 DIAGNOSIS — N186 End stage renal disease: Secondary | ICD-10-CM | POA: Diagnosis not present

## 2019-06-25 DIAGNOSIS — N186 End stage renal disease: Secondary | ICD-10-CM | POA: Diagnosis not present

## 2019-06-25 DIAGNOSIS — Z992 Dependence on renal dialysis: Secondary | ICD-10-CM | POA: Diagnosis not present

## 2019-06-25 DIAGNOSIS — N2581 Secondary hyperparathyroidism of renal origin: Secondary | ICD-10-CM | POA: Diagnosis not present

## 2019-06-27 DIAGNOSIS — N2581 Secondary hyperparathyroidism of renal origin: Secondary | ICD-10-CM | POA: Diagnosis not present

## 2019-06-27 DIAGNOSIS — Z992 Dependence on renal dialysis: Secondary | ICD-10-CM | POA: Diagnosis not present

## 2019-06-27 DIAGNOSIS — N186 End stage renal disease: Secondary | ICD-10-CM | POA: Diagnosis not present

## 2019-06-29 DIAGNOSIS — N186 End stage renal disease: Secondary | ICD-10-CM | POA: Diagnosis not present

## 2019-06-29 DIAGNOSIS — Z992 Dependence on renal dialysis: Secondary | ICD-10-CM | POA: Diagnosis not present

## 2019-06-29 DIAGNOSIS — E1122 Type 2 diabetes mellitus with diabetic chronic kidney disease: Secondary | ICD-10-CM | POA: Diagnosis not present

## 2019-07-03 ENCOUNTER — Ambulatory Visit: Payer: Medicare HMO

## 2019-07-03 DIAGNOSIS — I871 Compression of vein: Secondary | ICD-10-CM | POA: Diagnosis not present

## 2019-07-03 DIAGNOSIS — Z992 Dependence on renal dialysis: Secondary | ICD-10-CM | POA: Diagnosis not present

## 2019-07-03 DIAGNOSIS — N186 End stage renal disease: Secondary | ICD-10-CM | POA: Diagnosis not present

## 2019-07-03 DIAGNOSIS — T82858A Stenosis of vascular prosthetic devices, implants and grafts, initial encounter: Secondary | ICD-10-CM | POA: Diagnosis not present

## 2019-07-04 DIAGNOSIS — N2581 Secondary hyperparathyroidism of renal origin: Secondary | ICD-10-CM | POA: Diagnosis not present

## 2019-07-04 DIAGNOSIS — Z992 Dependence on renal dialysis: Secondary | ICD-10-CM | POA: Diagnosis not present

## 2019-07-04 DIAGNOSIS — N186 End stage renal disease: Secondary | ICD-10-CM | POA: Diagnosis not present

## 2019-07-06 ENCOUNTER — Telehealth (HOSPITAL_COMMUNITY): Payer: Self-pay

## 2019-07-06 DIAGNOSIS — N2581 Secondary hyperparathyroidism of renal origin: Secondary | ICD-10-CM | POA: Diagnosis not present

## 2019-07-06 DIAGNOSIS — Z992 Dependence on renal dialysis: Secondary | ICD-10-CM | POA: Diagnosis not present

## 2019-07-06 DIAGNOSIS — N186 End stage renal disease: Secondary | ICD-10-CM | POA: Diagnosis not present

## 2019-07-06 NOTE — Telephone Encounter (Signed)

## 2019-07-07 ENCOUNTER — Emergency Department (HOSPITAL_COMMUNITY): Payer: Medicare HMO

## 2019-07-07 ENCOUNTER — Ambulatory Visit (INDEPENDENT_AMBULATORY_CARE_PROVIDER_SITE_OTHER): Payer: Medicare HMO | Admitting: Family

## 2019-07-07 ENCOUNTER — Encounter: Payer: Self-pay | Admitting: Family

## 2019-07-07 ENCOUNTER — Other Ambulatory Visit: Payer: Self-pay

## 2019-07-07 ENCOUNTER — Encounter (HOSPITAL_COMMUNITY): Payer: Self-pay

## 2019-07-07 ENCOUNTER — Emergency Department (HOSPITAL_COMMUNITY)
Admission: EM | Admit: 2019-07-07 | Discharge: 2019-07-07 | Disposition: A | Discharge status: Home / Self Care | Payer: Medicare HMO | Attending: Emergency Medicine | Admitting: Emergency Medicine

## 2019-07-07 VITALS — BP 135/75 | HR 94 | Temp 97.4°F | Resp 18 | Ht 70.5 in | Wt 160.0 lb

## 2019-07-07 DIAGNOSIS — K5730 Diverticulosis of large intestine without perforation or abscess without bleeding: Secondary | ICD-10-CM | POA: Diagnosis not present

## 2019-07-07 DIAGNOSIS — N186 End stage renal disease: Secondary | ICD-10-CM | POA: Diagnosis not present

## 2019-07-07 DIAGNOSIS — I2510 Atherosclerotic heart disease of native coronary artery without angina pectoris: Secondary | ICD-10-CM | POA: Diagnosis not present

## 2019-07-07 DIAGNOSIS — I779 Disorder of arteries and arterioles, unspecified: Secondary | ICD-10-CM | POA: Diagnosis not present

## 2019-07-07 DIAGNOSIS — E119 Type 2 diabetes mellitus without complications: Secondary | ICD-10-CM | POA: Insufficient documentation

## 2019-07-07 DIAGNOSIS — F17200 Nicotine dependence, unspecified, uncomplicated: Secondary | ICD-10-CM | POA: Diagnosis not present

## 2019-07-07 DIAGNOSIS — Z992 Dependence on renal dialysis: Secondary | ICD-10-CM | POA: Diagnosis not present

## 2019-07-07 DIAGNOSIS — Z86718 Personal history of other venous thrombosis and embolism: Secondary | ICD-10-CM | POA: Insufficient documentation

## 2019-07-07 DIAGNOSIS — Z7901 Long term (current) use of anticoagulants: Secondary | ICD-10-CM | POA: Diagnosis not present

## 2019-07-07 DIAGNOSIS — I739 Peripheral vascular disease, unspecified: Secondary | ICD-10-CM | POA: Diagnosis not present

## 2019-07-07 DIAGNOSIS — R1031 Right lower quadrant pain: Secondary | ICD-10-CM

## 2019-07-07 DIAGNOSIS — K4030 Unilateral inguinal hernia, with obstruction, without gangrene, not specified as recurrent: Secondary | ICD-10-CM | POA: Diagnosis not present

## 2019-07-07 DIAGNOSIS — Z794 Long term (current) use of insulin: Secondary | ICD-10-CM | POA: Diagnosis not present

## 2019-07-07 DIAGNOSIS — I1311 Hypertensive heart and chronic kidney disease without heart failure, with stage 5 chronic kidney disease, or end stage renal disease: Secondary | ICD-10-CM | POA: Diagnosis not present

## 2019-07-07 DIAGNOSIS — F17210 Nicotine dependence, cigarettes, uncomplicated: Secondary | ICD-10-CM | POA: Insufficient documentation

## 2019-07-07 LAB — COMPREHENSIVE METABOLIC PANEL
ALT: 11 U/L (ref 0–44)
AST: 23 U/L (ref 15–41)
Albumin: 2 g/dL — ABNORMAL LOW (ref 3.5–5.0)
Alkaline Phosphatase: 74 U/L (ref 38–126)
Anion gap: 14 (ref 5–15)
BUN: 30 mg/dL — ABNORMAL HIGH (ref 8–23)
CO2: 27 mmol/L (ref 22–32)
Calcium: 7.9 mg/dL — ABNORMAL LOW (ref 8.9–10.3)
Chloride: 95 mmol/L — ABNORMAL LOW (ref 98–111)
Creatinine, Ser: 6.12 mg/dL — ABNORMAL HIGH (ref 0.61–1.24)
GFR calc Af Amer: 10 mL/min — ABNORMAL LOW (ref >60)
GFR calc non Af Amer: 8 mL/min — ABNORMAL LOW (ref >60)
Glucose, Bld: 99 mg/dL (ref 70–99)
Potassium: 3.9 mmol/L (ref 3.5–5.1)
Sodium: 136 mmol/L (ref 135–145)
Total Bilirubin: 0.9 mg/dL (ref 0.3–1.2)
Total Protein: 6.7 g/dL (ref 6.5–8.1)

## 2019-07-07 LAB — CBC
HCT: 31.2 % — ABNORMAL LOW (ref 39.0–52.0)
Hemoglobin: 9.6 g/dL — ABNORMAL LOW (ref 13.0–17.0)
MCH: 25.4 pg — ABNORMAL LOW (ref 26.0–34.0)
MCHC: 30.8 g/dL (ref 30.0–36.0)
MCV: 82.5 fL (ref 80.0–100.0)
Platelets: 172 10*3/uL (ref 150–400)
RBC: 3.78 MIL/uL — ABNORMAL LOW (ref 4.22–5.81)
RDW: 20.7 % — ABNORMAL HIGH (ref 11.5–15.5)
WBC: 7.1 10*3/uL (ref 4.0–10.5)
nRBC: 0 % (ref 0.0–0.2)

## 2019-07-07 LAB — LIPASE, BLOOD: Lipase: 48 U/L (ref 11–51)

## 2019-07-07 LAB — LACTIC ACID, PLASMA: Lactic Acid, Venous: 1.6 mmol/L (ref 0.5–1.9)

## 2019-07-07 MED ORDER — FENTANYL CITRATE (PF) 100 MCG/2ML IJ SOLN
25.0000 ug | Freq: Once | INTRAMUSCULAR | Status: AC
  Administered 2019-07-07: 25 ug via INTRAVENOUS
  Filled 2019-07-07: qty 2

## 2019-07-07 MED ORDER — IOHEXOL 350 MG/ML SOLN
100.0000 mL | Freq: Once | INTRAVENOUS | Status: AC | PRN
  Administered 2019-07-07: 20:00:00 100 mL via INTRAVENOUS

## 2019-07-07 MED ORDER — FENTANYL CITRATE (PF) 100 MCG/2ML IJ SOLN
50.0000 ug | Freq: Once | INTRAMUSCULAR | Status: AC
  Administered 2019-07-07: 50 ug via INTRAVENOUS
  Filled 2019-07-07: qty 2

## 2019-07-07 MED ORDER — LIDOCAINE 5 % EX PTCH
1.0000 | MEDICATED_PATCH | CUTANEOUS | Status: DC
  Administered 2019-07-07: 1 via TRANSDERMAL
  Filled 2019-07-07: qty 1

## 2019-07-07 NOTE — ED Triage Notes (Signed)
Pt sent here from vascular specialist for further evaluation of a hernia. Pt states it has been there a while but has started to hurt more. Denies n/v. Pt receives dialysis T/TH/SAT, last treatment yesterday. Pt a.o

## 2019-07-07 NOTE — Patient Instructions (Signed)
Tobacco Use Disorder Tobacco use disorder (TUD) occurs when a person craves, seeks, and uses tobacco, regardless of the consequences. This disorder can cause problems with mental and physical health. It can affect your ability to have healthy relationships, and it can keep you from meeting your responsibilities at work, home, or school. Tobacco may be:  Smoked as a cigarette or cigar.  Inhaled using e-cigarettes.  Smoked in a pipe or hookah.  Chewed as smokeless tobacco.  Inhaled into the nostrils as snuff. Tobacco products contain a dangerous chemical called nicotine, which is very addictive. Nicotine triggers hormones that make the body feel stimulated and works on areas of the brain that make you feel good. These effects can make it hard for people to quit nicotine. Tobacco contains many other unsafe chemicals that can damage almost every organ in the body. Smoking tobacco also puts others in danger due to fire risk and possible health problems caused by breathing in secondhand smoke. What are the signs or symptoms? Symptoms of TUD may include:  Being unable to slow down or stop your tobacco use.  Spending an abnormal amount of time getting or using tobacco.  Craving tobacco. Cravings may last for up to 6 months after quitting.  Tobacco use that: ? Interferes with your work, school, or home life. ? Interferes with your personal and social relationships. ? Makes you give up activities that you once enjoyed or found important.  Using tobacco even though you know that it is: ? Dangerous or bad for your health or someone else's health. ? Causing problems in your life.  Needing more and more of the substance to get the same effect (developing tolerance).  Experiencing unpleasant symptoms if you do not use the substance (withdrawal). Withdrawal symptoms may include: ? Depressed, anxious, or irritable mood. ? Difficulty concentrating. ? Increased appetite. ? Restlessness or trouble  sleeping.  Using the substance to avoid withdrawal. How is this diagnosed? This condition may be diagnosed based on:  Your current and past tobacco use. Your health care provider may ask questions about how your tobacco use affects your life.  A physical exam. You may be diagnosed with TUD if you have at least two symptoms within a 12-month period. How is this treated? This condition is treated by stopping tobacco use. Many people are unable to quit on their own and need help. Treatment may include:  Nicotine replacement therapy (NRT). NRT provides nicotine without the other harmful chemicals in tobacco. NRT gradually lowers the dosage of nicotine in the body and reduces withdrawal symptoms. NRT is available as: ? Over-the-counter gums, lozenges, and skin patches. ? Prescription mouth inhalers and nasal sprays.  Medicine that acts on the brain to reduce cravings and withdrawal symptoms.  A type of talk therapy that examines your triggers for tobacco use, how to avoid them, and how to cope with cravings (behavioral therapy).  Hypnosis. This may help with withdrawal symptoms.  Joining a support group for others coping with TUD. The best treatment for TUD is usually a combination of medicine, talk therapy, and support groups. Recovery can be a long process. Many people start using tobacco again after stopping (relapse). If you relapse, it does not mean that treatment will not work. Follow these instructions at home:  Lifestyle  Do not use any products that contain nicotine or tobacco, such as cigarettes and e-cigarettes.  Avoid things that trigger tobacco use as much as you can. Triggers include people and situations that usually cause you   to use tobacco.  Avoid drinks that contain caffeine, including coffee. These may worsen some withdrawal symptoms.  Find ways to manage stress. Wanting to smoke may cause stress, and stress can make you want to smoke. Relaxation techniques such as  deep breathing, meditation, and yoga may help.  Attend support groups as needed. These groups are an important part of long-term recovery for many people. General instructions  Take over-the-counter and prescription medicines only as told by your health care provider.  Check with your health care provider before taking any new prescription or over-the-counter medicines.  Decide on a friend, family member, or smoking quit-line (such as 1-800-QUIT-NOW in the [place] that you can call or text when you feel the urge to smoke or when you need help coping with cravings.  Keep all follow-up visits as told by your health care provider and therapist. This is important. Contact a health care provider if:  You are not able to take your medicines as prescribed.  Your symptoms get worse, even with treatment. Summary  Tobacco use disorder (TUD) occurs when a person craves, seeks, and uses tobacco regardless of the consequences.  This condition may be diagnosed based on your current and past tobacco use and a physical exam.  Many people are unable to quit on their own and need help. Recovery can be a long process.  The most effective treatment for TUD is usually a combination of medicine, talk therapy, and support groups. This information is not intended to replace advice given to you by your health care provider. Make sure you discuss any questions you have with your health care provider. Document Revised: 06/02/2017 Document Reviewed: 06/02/2017 Elsevier Patient Education  2020 [employer].     Steps to Quit Smoking Smoking tobacco is the leading cause of preventable death. It can affect almost every organ in the body. Smoking puts you and people around you at risk for many serious, long-lasting (chronic) diseases. Quitting smoking can be hard, but it is one of the best things that you can do for your health. It is never too late to quit. How do I get ready to quit? When you decide to quit  smoking, make a plan to help you succeed. Before you quit:  Pick a date to quit. Set a date within the next 2 weeks to give you time to prepare.  Write down the reasons why you are quitting. Keep this list in places where you will see it often.  Tell your family, friends, and co-workers that you are quitting. Their support is important.  Talk with your doctor about the choices that may help you quit.  Find out if your health insurance will pay for these treatments.  Know the people, places, things, and activities that make you want to smoke (triggers). Avoid them. What first steps can I take to quit smoking?  Throw away all cigarettes at home, at work, and in your car.  Throw away the things that you use when you smoke, such as ashtrays and lighters.  Clean your car. Make sure to empty the ashtray.  Clean your home, including curtains and carpets. What can I do to help me quit smoking? Talk with your doctor about taking medicines and seeing a counselor at the same time. You are more likely to succeed when you do both.  If you are pregnant or breastfeeding, talk with your doctor about counseling or other ways to quit smoking. Do not take medicine to help you quit smoking  unless your doctor tells you to do so. To quit smoking: Quit right away  Quit smoking totally, instead of slowly cutting back on how much you smoke over a period of time.  Go to counseling. You are more likely to quit if you go to counseling sessions regularly. Take medicine You may take medicines to help you quit. Some medicines need a prescription, and some you can buy over-the-counter. Some medicines may contain a drug called nicotine to replace the nicotine in cigarettes. Medicines may:  Help you to stop having the desire to smoke (cravings).  Help to stop the problems that come when you stop smoking (withdrawal symptoms). Your doctor may ask you to use:  Nicotine patches, gum, or lozenges.  Nicotine  inhalers or sprays.  Non-nicotine medicine that is taken by mouth. Find resources Find resources and other ways to help you quit smoking and remain smoke-free after you quit. These resources are most helpful when you use them often. They include:  Online chats with a Social worker.  Phone quitlines.  Printed Furniture conservator/restorer.  Support groups or group counseling.  Text messaging programs.  Mobile phone apps. Use apps on your mobile phone or tablet that can help you stick to your quit plan. There are many free apps for mobile phones and tablets as well as websites. Examples include Quit Guide from the [employer] and smokefree.gov  What things can I do to make it easier to quit?   Talk to your family and friends. Ask them to support and encourage you.  Call a phone quitline (1-800-QUIT-NOW), reach out to support groups, or work with a Social worker.  Ask people who smoke to not smoke around you.  Avoid places that make you want to smoke, such as: ? Bars. ? Parties. ? Smoke-break areas at work.  Spend time with people who do not smoke.  Lower the stress in your life. Stress can make you want to smoke. Try these things to help your stress: ? Getting regular exercise. ? Doing deep-breathing exercises. ? Doing yoga. ? Meditating. ? Doing a body scan. To do this, close your eyes, focus on one area of your body at a time from head to toe. Notice which parts of your body are tense. Try to relax the muscles in those areas. How will I feel when I quit smoking? Day 1 to 3 weeks Within the first 24 hours, you may start to have some problems that come from quitting tobacco. These problems are very bad 2-3 days after you quit, but they do not often last for more than 2-3 weeks. You may get these symptoms:  Mood swings.  Feeling restless, nervous, angry, or annoyed.  Trouble concentrating.  Dizziness.  Strong desire for high-sugar foods and nicotine.  Weight gain.  Trouble pooping  (constipation).  Feeling like you may vomit (nausea).  Coughing or a sore throat.  Changes in how the medicines that you take for other issues work in your body.  Depression.  Trouble sleeping (insomnia). Week 3 and afterward After the first 2-3 weeks of quitting, you may start to notice more positive results, such as:  Better sense of smell and taste.  Less coughing and sore throat.  Slower heart rate.  Lower blood pressure.  Clearer skin.  Better breathing.  Fewer sick days. Quitting smoking can be hard. Do not give up if you fail the first time. Some people need to try a few times before they succeed. Do your best to stick to your  quit plan, and talk with your doctor if you have any questions or concerns. Summary  Smoking tobacco is the leading cause of preventable death. Quitting smoking can be hard, but it is one of the best things that you can do for your health.  When you decide to quit smoking, make a plan to help you succeed.  Quit smoking right away, not slowly over a period of time.  When you start quitting, seek help from your doctor, family, or friends. This information is not intended to replace advice given to you by your health care provider. Make sure you discuss any questions you have with your health care provider. Document Revised: 03/10/2019 Document Reviewed: 09/03/2018 Elsevier Patient Education  [place].     Peripheral Vascular Disease  Peripheral vascular disease (PVD) is a disease of the blood vessels that are not part of your heart and brain. A simple term for PVD is poor circulation. In most cases, PVD narrows the blood vessels that carry blood from your heart to the rest of your body. This can reduce the supply of blood to your arms, legs, and internal organs, like your stomach or kidneys. However, PVD most often affects a person's lower legs and feet. Without treatment, PVD tends to get worse. PVD can also lead to acute ischemic  limb. This is when an arm or leg suddenly cannot get enough blood. This is a medical emergency. Follow these instructions at home: Lifestyle  Do not use any products that contain nicotine or tobacco, such as cigarettes and e-cigarettes. If you need help quitting, ask your doctor.  Lose weight if you are overweight. Or, stay at a healthy weight as told by your doctor.  Eat a diet that is low in fat and cholesterol. If you need help, ask your doctor.  Exercise regularly. Ask your doctor for activities that are right for you. General instructions  Take over-the-counter and prescription medicines only as told by your doctor.  Take good care of your feet: ? Wear comfortable shoes that fit well. ? Check your feet often for any cuts or sores.  Keep all follow-up visits as told by your doctor This is important. Contact a doctor if:  You have cramps in your legs when you walk.  You have leg pain when you are at rest.  You have coldness in a leg or foot.  Your skin changes.  You are unable to get or have an erection (erectile dysfunction).  You have cuts or sores on your feet that do not heal. Get help right away if:  Your arm or leg turns cold, numb, and blue.  Your arms or legs become red, warm, swollen, painful, or numb.  You have chest pain.  You have trouble breathing.  You suddenly have weakness in your face, arm, or leg.  You become very confused or you cannot speak.  You suddenly have a very bad headache.  You suddenly cannot see. Summary  Peripheral vascular disease (PVD) is a disease of the blood vessels.  A simple term for PVD is poor circulation. Without treatment, PVD tends to get worse.  Treatment may include exercise, low fat and low cholesterol diet, and quitting smoking. This information is not intended to replace advice given to you by your health care provider. Make sure you discuss any questions you have with your health care provider. Document  Revised: 05/28/2017 Document Reviewed: 07/23/2016 Elsevier Patient Education  2020 [employer].

## 2019-07-07 NOTE — ED Notes (Signed)
Per PA Caccavale, second Lactic Acid does not need to be collected.

## 2019-07-07 NOTE — ED Notes (Signed)
Pt returned from CT °

## 2019-07-07 NOTE — Discharge Instructions (Addendum)
Continue taking home medications as prescribed.  Follow up with your primary care doctor regarding your continued symptoms.  Return to the ER with any new, worsening, or concerning symptoms.
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
## 2019-07-08 DIAGNOSIS — Z992 Dependence on renal dialysis: Secondary | ICD-10-CM | POA: Diagnosis not present

## 2019-07-08 DIAGNOSIS — N2581 Secondary hyperparathyroidism of renal origin: Secondary | ICD-10-CM | POA: Diagnosis not present

## 2019-07-08 DIAGNOSIS — N186 End stage renal disease: Secondary | ICD-10-CM | POA: Diagnosis not present

## 2019-07-08 NOTE — ED Notes (Signed)
Patient verbalizes understanding of discharge instructions. Opportunity for questioning and answers were provided. Armband removed by staff, pt discharged from ED in wheelchair to home.   

## 2019-07-11 DIAGNOSIS — N186 End stage renal disease: Secondary | ICD-10-CM | POA: Diagnosis not present

## 2019-07-11 DIAGNOSIS — Z992 Dependence on renal dialysis: Secondary | ICD-10-CM | POA: Diagnosis not present

## 2019-07-11 DIAGNOSIS — N2581 Secondary hyperparathyroidism of renal origin: Secondary | ICD-10-CM | POA: Diagnosis not present

## 2019-07-12 DIAGNOSIS — N186 End stage renal disease: Secondary | ICD-10-CM | POA: Diagnosis not present

## 2019-07-12 DIAGNOSIS — M1990 Unspecified osteoarthritis, unspecified site: Secondary | ICD-10-CM | POA: Diagnosis not present

## 2019-07-12 DIAGNOSIS — J449 Chronic obstructive pulmonary disease, unspecified: Secondary | ICD-10-CM | POA: Diagnosis not present

## 2019-07-12 DIAGNOSIS — I739 Peripheral vascular disease, unspecified: Secondary | ICD-10-CM | POA: Diagnosis not present

## 2019-07-13 DIAGNOSIS — N186 End stage renal disease: Secondary | ICD-10-CM | POA: Diagnosis not present

## 2019-07-13 DIAGNOSIS — N2581 Secondary hyperparathyroidism of renal origin: Secondary | ICD-10-CM | POA: Diagnosis not present

## 2019-07-13 DIAGNOSIS — Z992 Dependence on renal dialysis: Secondary | ICD-10-CM | POA: Diagnosis not present

## 2019-07-14 ENCOUNTER — Other Ambulatory Visit: Payer: Self-pay

## 2019-07-14 ENCOUNTER — Encounter: Payer: Self-pay | Admitting: Family Medicine

## 2019-07-14 ENCOUNTER — Ambulatory Visit (INDEPENDENT_AMBULATORY_CARE_PROVIDER_SITE_OTHER): Payer: Medicare HMO | Admitting: Family Medicine

## 2019-07-14 VITALS — BP 130/70 | HR 92 | Temp 97.2°F | Resp 20 | Ht 70.0 in | Wt 165.0 lb

## 2019-07-14 DIAGNOSIS — R1031 Right lower quadrant pain: Secondary | ICD-10-CM | POA: Diagnosis not present

## 2019-07-14 MED ORDER — PREDNISONE 20 MG PO TABS: 40.0000 mg | ORAL_TABLET | Freq: Every day | ORAL | 0 refills | Status: DC
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
## 2019-07-15 DIAGNOSIS — N2581 Secondary hyperparathyroidism of renal origin: Secondary | ICD-10-CM | POA: Diagnosis not present

## 2019-07-15 DIAGNOSIS — N186 End stage renal disease: Secondary | ICD-10-CM | POA: Diagnosis not present

## 2019-07-15 DIAGNOSIS — Z992 Dependence on renal dialysis: Secondary | ICD-10-CM | POA: Diagnosis not present

## 2019-07-16 ENCOUNTER — Emergency Department (HOSPITAL_COMMUNITY): Payer: Medicare HMO

## 2019-07-16 ENCOUNTER — Inpatient Hospital Stay (HOSPITAL_COMMUNITY)
Admission: EM | Admit: 2019-07-16 | Discharge: 2019-07-18 | DRG: 640 | Discharge status: Against Medical Advice | Payer: Medicare HMO | Attending: Internal Medicine | Admitting: Internal Medicine

## 2019-07-16 ENCOUNTER — Encounter (HOSPITAL_COMMUNITY): Payer: Self-pay | Admitting: Emergency Medicine

## 2019-07-16 ENCOUNTER — Observation Stay (HOSPITAL_COMMUNITY): Payer: Medicare HMO

## 2019-07-16 DIAGNOSIS — Z5329 Procedure and treatment not carried out because of patient's decision for other reasons: Secondary | ICD-10-CM | POA: Diagnosis present

## 2019-07-16 DIAGNOSIS — R0600 Dyspnea, unspecified: Secondary | ICD-10-CM

## 2019-07-16 DIAGNOSIS — I4891 Unspecified atrial fibrillation: Secondary | ICD-10-CM | POA: Diagnosis present

## 2019-07-16 DIAGNOSIS — E8770 Fluid overload, unspecified: Principal | ICD-10-CM | POA: Diagnosis present

## 2019-07-16 DIAGNOSIS — E1122 Type 2 diabetes mellitus with diabetic chronic kidney disease: Secondary | ICD-10-CM | POA: Diagnosis present

## 2019-07-16 DIAGNOSIS — R1031 Right lower quadrant pain: Secondary | ICD-10-CM | POA: Diagnosis not present

## 2019-07-16 DIAGNOSIS — I132 Hypertensive heart and chronic kidney disease with heart failure and with stage 5 chronic kidney disease, or end stage renal disease: Secondary | ICD-10-CM | POA: Diagnosis present

## 2019-07-16 DIAGNOSIS — E785 Hyperlipidemia, unspecified: Secondary | ICD-10-CM | POA: Diagnosis present

## 2019-07-16 DIAGNOSIS — R079 Chest pain, unspecified: Secondary | ICD-10-CM | POA: Diagnosis not present

## 2019-07-16 DIAGNOSIS — J9621 Acute and chronic respiratory failure with hypoxia: Secondary | ICD-10-CM | POA: Diagnosis present

## 2019-07-16 DIAGNOSIS — F17210 Nicotine dependence, cigarettes, uncomplicated: Secondary | ICD-10-CM | POA: Diagnosis present

## 2019-07-16 DIAGNOSIS — I509 Heart failure, unspecified: Secondary | ICD-10-CM | POA: Diagnosis present

## 2019-07-16 DIAGNOSIS — K4090 Unilateral inguinal hernia, without obstruction or gangrene, not specified as recurrent: Secondary | ICD-10-CM | POA: Diagnosis present

## 2019-07-16 DIAGNOSIS — R0689 Other abnormalities of breathing: Secondary | ICD-10-CM | POA: Diagnosis not present

## 2019-07-16 DIAGNOSIS — M549 Dorsalgia, unspecified: Secondary | ICD-10-CM | POA: Diagnosis present

## 2019-07-16 DIAGNOSIS — Z91013 Allergy to seafood: Secondary | ICD-10-CM | POA: Diagnosis not present

## 2019-07-16 DIAGNOSIS — N186 End stage renal disease: Secondary | ICD-10-CM | POA: Diagnosis present

## 2019-07-16 DIAGNOSIS — E1151 Type 2 diabetes mellitus with diabetic peripheral angiopathy without gangrene: Secondary | ICD-10-CM | POA: Diagnosis present

## 2019-07-16 DIAGNOSIS — J449 Chronic obstructive pulmonary disease, unspecified: Secondary | ICD-10-CM | POA: Diagnosis present

## 2019-07-16 DIAGNOSIS — Z8249 Family history of ischemic heart disease and other diseases of the circulatory system: Secondary | ICD-10-CM

## 2019-07-16 DIAGNOSIS — Z91018 Allergy to other foods: Secondary | ICD-10-CM | POA: Diagnosis not present

## 2019-07-16 DIAGNOSIS — Z885 Allergy status to narcotic agent status: Secondary | ICD-10-CM | POA: Diagnosis not present

## 2019-07-16 DIAGNOSIS — J9600 Acute respiratory failure, unspecified whether with hypoxia or hypercapnia: Secondary | ICD-10-CM | POA: Diagnosis not present

## 2019-07-16 DIAGNOSIS — Z9841 Cataract extraction status, right eye: Secondary | ICD-10-CM

## 2019-07-16 DIAGNOSIS — Z86010 Personal history of colonic polyps: Secondary | ICD-10-CM | POA: Diagnosis not present

## 2019-07-16 DIAGNOSIS — I2510 Atherosclerotic heart disease of native coronary artery without angina pectoris: Secondary | ICD-10-CM | POA: Diagnosis present

## 2019-07-16 DIAGNOSIS — H9190 Unspecified hearing loss, unspecified ear: Secondary | ICD-10-CM | POA: Diagnosis present

## 2019-07-16 DIAGNOSIS — Z833 Family history of diabetes mellitus: Secondary | ICD-10-CM

## 2019-07-16 DIAGNOSIS — G8929 Other chronic pain: Secondary | ICD-10-CM | POA: Diagnosis present

## 2019-07-16 DIAGNOSIS — R58 Hemorrhage, not elsewhere classified: Secondary | ICD-10-CM | POA: Diagnosis not present

## 2019-07-16 DIAGNOSIS — Z888 Allergy status to other drugs, medicaments and biological substances status: Secondary | ICD-10-CM | POA: Diagnosis not present

## 2019-07-16 DIAGNOSIS — F17200 Nicotine dependence, unspecified, uncomplicated: Secondary | ICD-10-CM | POA: Diagnosis present

## 2019-07-16 DIAGNOSIS — R0902 Hypoxemia: Secondary | ICD-10-CM | POA: Diagnosis not present

## 2019-07-16 DIAGNOSIS — Z808 Family history of malignant neoplasm of other organs or systems: Secondary | ICD-10-CM

## 2019-07-16 DIAGNOSIS — E119 Type 2 diabetes mellitus without complications: Secondary | ICD-10-CM | POA: Diagnosis not present

## 2019-07-16 DIAGNOSIS — Z992 Dependence on renal dialysis: Secondary | ICD-10-CM

## 2019-07-16 DIAGNOSIS — R0602 Shortness of breath: Secondary | ICD-10-CM | POA: Diagnosis not present

## 2019-07-16 DIAGNOSIS — R0789 Other chest pain: Secondary | ICD-10-CM | POA: Diagnosis not present

## 2019-07-16 DIAGNOSIS — I120 Hypertensive chronic kidney disease with stage 5 chronic kidney disease or end stage renal disease: Secondary | ICD-10-CM | POA: Diagnosis not present

## 2019-07-16 DIAGNOSIS — Z20822 Contact with and (suspected) exposure to covid-19: Secondary | ICD-10-CM | POA: Diagnosis present

## 2019-07-16 DIAGNOSIS — E8889 Other specified metabolic disorders: Secondary | ICD-10-CM | POA: Diagnosis present

## 2019-07-16 DIAGNOSIS — E1129 Type 2 diabetes mellitus with other diabetic kidney complication: Secondary | ICD-10-CM | POA: Diagnosis present

## 2019-07-16 DIAGNOSIS — I10 Essential (primary) hypertension: Secondary | ICD-10-CM | POA: Diagnosis present

## 2019-07-16 DIAGNOSIS — Z7901 Long term (current) use of anticoagulants: Secondary | ICD-10-CM

## 2019-07-16 DIAGNOSIS — R000 Tachycardia, unspecified: Secondary | ICD-10-CM | POA: Diagnosis not present

## 2019-07-16 DIAGNOSIS — Z8551 Personal history of malignant neoplasm of bladder: Secondary | ICD-10-CM | POA: Diagnosis not present

## 2019-07-16 DIAGNOSIS — D638 Anemia in other chronic diseases classified elsewhere: Secondary | ICD-10-CM | POA: Diagnosis present

## 2019-07-16 HISTORY — DX: Acute and chronic respiratory failure with hypoxia: J96.21

## 2019-07-16 LAB — BRAIN NATRIURETIC PEPTIDE: B Natriuretic Peptide: 3017.3 pg/mL — ABNORMAL HIGH (ref 0.0–100.0)

## 2019-07-16 LAB — CBC WITH DIFFERENTIAL/PLATELET
Abs Immature Granulocytes: 0.09 10*3/uL — ABNORMAL HIGH (ref 0.00–0.07)
Basophils Absolute: 0 10*3/uL (ref 0.0–0.1)
Basophils Relative: 0 %
Eosinophils Absolute: 0 10*3/uL (ref 0.0–0.5)
Eosinophils Relative: 0 %
HCT: 35.5 % — ABNORMAL LOW (ref 39.0–52.0)
Hemoglobin: 10.8 g/dL — ABNORMAL LOW (ref 13.0–17.0)
Immature Granulocytes: 1 %
Lymphocytes Relative: 21 %
Lymphs Abs: 3 10*3/uL (ref 0.7–4.0)
MCH: 26 pg (ref 26.0–34.0)
MCHC: 30.4 g/dL (ref 30.0–36.0)
MCV: 85.5 fL (ref 80.0–100.0)
Monocytes Absolute: 0.5 10*3/uL (ref 0.1–1.0)
Monocytes Relative: 3 %
Neutro Abs: 11.1 10*3/uL — ABNORMAL HIGH (ref 1.7–7.7)
Neutrophils Relative %: 75 %
Platelets: 280 10*3/uL (ref 150–400)
RBC: 4.15 MIL/uL — ABNORMAL LOW (ref 4.22–5.81)
RDW: 21.3 % — ABNORMAL HIGH (ref 11.5–15.5)
WBC: 14.7 10*3/uL — ABNORMAL HIGH (ref 4.0–10.5)
nRBC: 0 % (ref 0.0–0.2)

## 2019-07-16 LAB — BASIC METABOLIC PANEL
Anion gap: 12 (ref 5–15)
BUN: 27 mg/dL — ABNORMAL HIGH (ref 8–23)
CO2: 30 mmol/L (ref 22–32)
Calcium: 8.4 mg/dL — ABNORMAL LOW (ref 8.9–10.3)
Chloride: 98 mmol/L (ref 98–111)
Creatinine, Ser: 3.92 mg/dL — ABNORMAL HIGH (ref 0.61–1.24)
GFR calc Af Amer: 16 mL/min — ABNORMAL LOW (ref >60)
GFR calc non Af Amer: 14 mL/min — ABNORMAL LOW (ref >60)
Glucose, Bld: 159 mg/dL — ABNORMAL HIGH (ref 70–99)
Potassium: 3.5 mmol/L (ref 3.5–5.1)
Sodium: 140 mmol/L (ref 135–145)

## 2019-07-16 LAB — GLUCOSE, CAPILLARY
Glucose-Capillary: 170 mg/dL — ABNORMAL HIGH (ref 70–99)
Glucose-Capillary: 156 mg/dL — ABNORMAL HIGH (ref 70–99)

## 2019-07-16 LAB — HEPATITIS B SURFACE ANTIGEN: Hepatitis B Surface Ag: NONREACTIVE

## 2019-07-16 LAB — RESPIRATORY PANEL BY RT PCR (FLU A&B, COVID)
Influenza A by PCR: NEGATIVE
Influenza B by PCR: NEGATIVE
SARS Coronavirus 2 by RT PCR: NEGATIVE

## 2019-07-16 LAB — POC SARS CORONAVIRUS 2 AG -  ED: SARS Coronavirus 2 Ag: NEGATIVE

## 2019-07-16 MED ORDER — SODIUM CHLORIDE 0.9% FLUSH
3.0000 mL | Freq: Two times a day (BID) | INTRAVENOUS | Status: DC
  Administered 2019-07-16 – 2019-07-18 (×4): 3 mL via INTRAVENOUS

## 2019-07-16 MED ORDER — ALBUTEROL SULFATE HFA 108 (90 BASE) MCG/ACT IN AERS
8.0000 | INHALATION_SPRAY | Freq: Once | RESPIRATORY_TRACT | Status: AC
  Administered 2019-07-16: 8 via RESPIRATORY_TRACT

## 2019-07-16 MED ORDER — OXYCODONE-ACETAMINOPHEN 10-325 MG PO TABS: 1.0000 | ORAL_TABLET | ORAL | Status: DC | PRN

## 2019-07-16 MED ORDER — DOCUSATE SODIUM 283 MG RE ENEM
1.0000 | ENEMA | RECTAL | Status: DC | PRN
  Filled 2019-07-16: qty 1

## 2019-07-16 MED ORDER — OXYCODONE-ACETAMINOPHEN 5-325 MG PO TABS
1.0000 | ORAL_TABLET | ORAL | Status: DC | PRN
  Administered 2019-07-16 – 2019-07-17 (×2): 1 via ORAL
  Filled 2019-07-16 (×2): qty 1

## 2019-07-16 MED ORDER — NEPRO/CARBSTEADY PO LIQD
237.0000 mL | Freq: Three times a day (TID) | ORAL | Status: DC | PRN
  Filled 2019-07-16: qty 237

## 2019-07-16 MED ORDER — OXYCODONE HCL 5 MG PO TABS: 5.0000 mg | ORAL_TABLET | ORAL | Status: DC | PRN

## 2019-07-16 MED ORDER — LINACLOTIDE 72 MCG PO CAPS: 72.0000 ug | ORAL_CAPSULE | Freq: Every day | ORAL | Status: DC

## 2019-07-16 MED ORDER — ALBUMIN HUMAN 25 % IV SOLN
25.0000 g | Freq: Once | INTRAVENOUS | Status: AC
  Administered 2019-07-16: 25 g via INTRAVENOUS

## 2019-07-16 MED ORDER — HYDROXYZINE HCL 25 MG PO TABS: 25.0000 mg | ORAL_TABLET | Freq: Three times a day (TID) | ORAL | Status: DC | PRN

## 2019-07-16 MED ORDER — INSULIN ASPART 100 UNIT/ML ~~LOC~~ SOLN
0.0000 [IU] | Freq: Three times a day (TID) | SUBCUTANEOUS | Status: DC
  Administered 2019-07-16 – 2019-07-17 (×2): 1 [IU] via SUBCUTANEOUS

## 2019-07-16 MED ORDER — ZOLPIDEM TARTRATE 5 MG PO TABS: 5.0000 mg | ORAL_TABLET | Freq: Every evening | ORAL | Status: DC | PRN

## 2019-07-16 MED ORDER — ALBUTEROL SULFATE HFA 108 (90 BASE) MCG/ACT IN AERS: 8.0000 | INHALATION_SPRAY | Freq: Once | RESPIRATORY_TRACT | Status: DC

## 2019-07-16 MED ORDER — SODIUM CHLORIDE 0.9 % IV SOLN
500.0000 mg | Freq: Once | INTRAVENOUS | Status: AC
  Administered 2019-07-16: 500 mg via INTRAVENOUS
  Filled 2019-07-16: qty 500

## 2019-07-16 MED ORDER — ACETAMINOPHEN 650 MG RE SUPP: 650.0000 mg | Freq: Four times a day (QID) | RECTAL | Status: DC | PRN

## 2019-07-16 MED ORDER — DOCUSATE SODIUM 100 MG PO CAPS
100.0000 mg | ORAL_CAPSULE | Freq: Every day | ORAL | Status: DC
  Administered 2019-07-16 – 2019-07-18 (×3): 100 mg via ORAL
  Filled 2019-07-16 (×3): qty 1

## 2019-07-16 MED ORDER — ONDANSETRON HCL 4 MG PO TABS: 4.0000 mg | ORAL_TABLET | Freq: Four times a day (QID) | ORAL | Status: DC | PRN

## 2019-07-16 MED ORDER — SODIUM CHLORIDE 0.9 % IV SOLN
1.0000 g | Freq: Once | INTRAVENOUS | Status: AC
  Administered 2019-07-16: 1 g via INTRAVENOUS
  Filled 2019-07-16: qty 10

## 2019-07-16 MED ORDER — CALCIUM CARBONATE ANTACID 1250 MG/5ML PO SUSP
500.0000 mg | Freq: Four times a day (QID) | ORAL | Status: DC | PRN
  Filled 2019-07-16: qty 5

## 2019-07-16 MED ORDER — APIXABAN 5 MG PO TABS
5.0000 mg | ORAL_TABLET | Freq: Two times a day (BID) | ORAL | Status: DC
  Administered 2019-07-16 – 2019-07-18 (×5): 5 mg via ORAL
  Filled 2019-07-16 (×7): qty 1

## 2019-07-16 MED ORDER — POLYETHYLENE GLYCOL 3350 17 GM/SCOOP PO POWD
17.0000 g | Freq: Two times a day (BID) | ORAL | Status: DC | PRN
  Filled 2019-07-16: qty 255

## 2019-07-16 MED ORDER — ACETAMINOPHEN 325 MG PO TABS: 650.0000 mg | ORAL_TABLET | Freq: Four times a day (QID) | ORAL | Status: DC | PRN

## 2019-07-16 MED ORDER — ATORVASTATIN CALCIUM 80 MG PO TABS
80.0000 mg | ORAL_TABLET | Freq: Every day | ORAL | Status: DC
  Administered 2019-07-16 – 2019-07-17 (×2): 80 mg via ORAL
  Filled 2019-07-16 (×2): qty 1

## 2019-07-16 MED ORDER — SODIUM CHLORIDE 0.9% FLUSH: 3.0000 mL | INTRAVENOUS | Status: DC | PRN

## 2019-07-16 MED ORDER — ONDANSETRON HCL 4 MG/2ML IJ SOLN: 4.0000 mg | Freq: Four times a day (QID) | INTRAMUSCULAR | Status: DC | PRN

## 2019-07-16 MED ORDER — EZETIMIBE 10 MG PO TABS
10.0000 mg | ORAL_TABLET | Freq: Every day | ORAL | Status: DC
  Administered 2019-07-16 – 2019-07-18 (×3): 10 mg via ORAL
  Filled 2019-07-16 (×4): qty 1

## 2019-07-16 MED ORDER — PANTOPRAZOLE SODIUM 40 MG PO TBEC
40.0000 mg | DELAYED_RELEASE_TABLET | Freq: Every day | ORAL | Status: DC
  Administered 2019-07-16 – 2019-07-18 (×3): 40 mg via ORAL
  Filled 2019-07-16 (×3): qty 1

## 2019-07-16 MED ORDER — ALBUMIN HUMAN 25 % IV SOLN
INTRAVENOUS | Status: AC
  Filled 2019-07-16: qty 100

## 2019-07-16 MED ORDER — SORBITOL 70 % SOLN
30.0000 mL | Status: DC | PRN
  Filled 2019-07-16: qty 30

## 2019-07-16 MED ORDER — CHLORHEXIDINE GLUCONATE CLOTH 2 % EX PADS
6.0000 | MEDICATED_PAD | Freq: Every day | CUTANEOUS | Status: DC
  Administered 2019-07-17: 6 via TOPICAL

## 2019-07-16 MED ORDER — ALBUTEROL SULFATE (2.5 MG/3ML) 0.083% IN NEBU
5.0000 mg | INHALATION_SOLUTION | Freq: Once | RESPIRATORY_TRACT | Status: AC
  Administered 2019-07-16: 5 mg via RESPIRATORY_TRACT

## 2019-07-16 MED ORDER — ALBUMIN HUMAN 25 % IV SOLN: 25.0000 g | Freq: Once | INTRAVENOUS | Status: AC

## 2019-07-16 MED ORDER — TIZANIDINE HCL 4 MG PO TABS: 2.0000 mg | ORAL_TABLET | Freq: Three times a day (TID) | ORAL | Status: DC | PRN

## 2019-07-16 MED ORDER — SODIUM CHLORIDE 0.9 % IV SOLN: 250.0000 mL | INTRAVENOUS | Status: DC | PRN

## 2019-07-16 MED ORDER — ALBUTEROL (5 MG/ML) CONTINUOUS INHALATION SOLN: 5.0000 mg/h | INHALATION_SOLUTION | Freq: Once | RESPIRATORY_TRACT | Status: DC

## 2019-07-16 MED ORDER — POLYETHYLENE GLYCOL 3350 17 G PO PACK: 17.0000 g | PACK | Freq: Two times a day (BID) | ORAL | Status: DC | PRN

## 2019-07-16 MED ORDER — METOPROLOL SUCCINATE ER 25 MG PO TB24
25.0000 mg | ORAL_TABLET | Freq: Every day | ORAL | Status: DC
  Administered 2019-07-16 – 2019-07-18 (×3): 25 mg via ORAL
  Filled 2019-07-16 (×3): qty 1

## 2019-07-16 MED ORDER — ERYTHROMYCIN 5 MG/GM OP OINT
1.0000 "application " | TOPICAL_OINTMENT | Freq: Every day | OPHTHALMIC | Status: DC | PRN
  Filled 2019-07-16: qty 3.5

## 2019-07-16 MED ORDER — NICOTINE 14 MG/24HR TD PT24
14.0000 mg | MEDICATED_PATCH | Freq: Every day | TRANSDERMAL | Status: DC
  Administered 2019-07-16 – 2019-07-18 (×3): 14 mg via TRANSDERMAL
  Filled 2019-07-16 (×3): qty 1

## 2019-07-16 MED ORDER — CAMPHOR-MENTHOL 0.5-0.5 % EX LOTN
1.0000 "application " | TOPICAL_LOTION | Freq: Three times a day (TID) | CUTANEOUS | Status: DC | PRN
  Filled 2019-07-16: qty 222

## 2019-07-16 MED ORDER — ALBUMIN HUMAN 25 % IV SOLN
INTRAVENOUS | Status: AC
  Administered 2019-07-16: 25 g via INTRAVENOUS
  Filled 2019-07-16: qty 100

## 2019-07-16 NOTE — ED Notes (Signed)
ED Provider at bedside. 
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
## 2019-07-16 NOTE — ED Provider Notes (Addendum)
Medical screening examination/treatment/procedure(s) were conducted as a shared visit with non-physician practitioner(s) and myself.  I personally evaluated the patient during the encounter. Briefly, the patient is a 74 y.o. male with history of hypertension, COPD, high cholesterol, end-stage renal disease on hemodialysis with last session yesterday.  Patient comes in with shortness of breath.  Was on BiPAP with EMS but switched to nonrebreather upon initial arrival.  Patient had received breathing treatments, steroids, magnesium with EMS.  He has wheezing on exam.  I reevaluated the patient about an hour after he arrived and was able to titrate him down to 4 L of oxygen.  He feels better after breathing treatments.  He still has wheezing on exam and some mild tachypnea.  Covid test is negative.  Believe that BiPAP would be beneficial at this time until steroids kick in.  Could be a component of volume overload as well.  Patient does not specifically say has had a cough, no fever.  Will give a dose of antibiotics in case this is infectious process.  However appears may be more consistent with a combination of COPD and volume overload.  Patient does not make much urine at all.  We will get a blood gas for further evaluation to see if patient has any CO2 retention.  His mentation is good but he does have some mild tachypnea.  Anticipate admission for further respiratory care likely from COPD and volume overload and likely needs more dialysis.  This chart was dictated using voice recognition software.  Despite best efforts to proofread,  errors can occur which can change the documentation meaning.     EKG Interpretation  Date/Time:  Sunday July 16 2019 07:16:22 EST Ventricular Rate:  110 PR Interval:    QRS Duration: 119 QT Interval:  363 QTC Calculation: 492 R Axis:   58 Text Interpretation: Sinus tachycardia Atrial premature complex Nonspecific intraventricular conduction delay Minimal ST depression,  inferior leads Artifact in lead(s) I II aVR Confirmed by Lennice Sites [phone]) on 07/16/2019 7:21:54 AM          Lennice Sites, DO 07/16/19 0818    Ronnald Nian, [place], DO 07/16/19 8299
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
## 2019-07-16 NOTE — Progress Notes (Signed)
Pt taken off Bipap after HD tx. Pt tolerating well & in no distress at this time. MD aware

## 2019-07-16 NOTE — ED Provider Notes (Signed)
Patient seen/examined in the Emergency Department in conjunction with Advanced Practice Provider [place] Patient reports shortness of breath, sudden onset Patient with history of ESRD/COPD Exam : Awake alert, respiratory distress noted, wheezing bilaterally Plan: Patient will be transitioned to BiPAP and monitor closely.  Patient is critically ill    Ripley Fraise, MD 07/16/19 [phone]
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
## 2019-07-16 NOTE — ED Triage Notes (Addendum)
Pt arrives via [facility] from home with sudden onset of sob around 0300 this morning, had dialysis yesterday, received solumedrol and mag via ems, pt was on cpap prior to arrival with sats around 92%. Pt denies cp at this time, rales heard in all fields after meds given, per ems. resp labored, pt tachypneic and tachycardic upon arrival. NRB on currently with sats at 99%, pt a/o, talking.

## 2019-07-16 NOTE — Procedures (Signed)
   I was present at this dialysis session, have reviewed the session itself and made  appropriate changes Kelly Splinter MD [place] pager [phone]   07/16/2019, 1:29 PM

## 2019-07-16 NOTE — Progress Notes (Signed)
Patient arrive to unit approx 1520 on 2L [place].  Patient left unit for [place] at 1851.
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
## 2019-07-17 ENCOUNTER — Other Ambulatory Visit: Payer: Self-pay

## 2019-07-17 ENCOUNTER — Other Ambulatory Visit: Payer: Self-pay | Admitting: Family Medicine

## 2019-07-17 DIAGNOSIS — I2510 Atherosclerotic heart disease of native coronary artery without angina pectoris: Secondary | ICD-10-CM | POA: Diagnosis present

## 2019-07-17 DIAGNOSIS — Z91013 Allergy to seafood: Secondary | ICD-10-CM | POA: Diagnosis not present

## 2019-07-17 DIAGNOSIS — I132 Hypertensive heart and chronic kidney disease with heart failure and with stage 5 chronic kidney disease, or end stage renal disease: Secondary | ICD-10-CM | POA: Diagnosis present

## 2019-07-17 DIAGNOSIS — I4891 Unspecified atrial fibrillation: Secondary | ICD-10-CM | POA: Diagnosis present

## 2019-07-17 DIAGNOSIS — J449 Chronic obstructive pulmonary disease, unspecified: Secondary | ICD-10-CM | POA: Diagnosis present

## 2019-07-17 DIAGNOSIS — Z888 Allergy status to other drugs, medicaments and biological substances status: Secondary | ICD-10-CM | POA: Diagnosis not present

## 2019-07-17 DIAGNOSIS — E1122 Type 2 diabetes mellitus with diabetic chronic kidney disease: Secondary | ICD-10-CM | POA: Diagnosis present

## 2019-07-17 DIAGNOSIS — E8770 Fluid overload, unspecified: Secondary | ICD-10-CM | POA: Diagnosis present

## 2019-07-17 DIAGNOSIS — H9190 Unspecified hearing loss, unspecified ear: Secondary | ICD-10-CM | POA: Diagnosis present

## 2019-07-17 DIAGNOSIS — E785 Hyperlipidemia, unspecified: Secondary | ICD-10-CM | POA: Diagnosis present

## 2019-07-17 DIAGNOSIS — Z86010 Personal history of colonic polyps: Secondary | ICD-10-CM | POA: Diagnosis not present

## 2019-07-17 DIAGNOSIS — Z91018 Allergy to other foods: Secondary | ICD-10-CM | POA: Diagnosis not present

## 2019-07-17 DIAGNOSIS — G8929 Other chronic pain: Secondary | ICD-10-CM | POA: Diagnosis present

## 2019-07-17 DIAGNOSIS — Z20822 Contact with and (suspected) exposure to covid-19: Secondary | ICD-10-CM | POA: Diagnosis present

## 2019-07-17 DIAGNOSIS — J9621 Acute and chronic respiratory failure with hypoxia: Secondary | ICD-10-CM | POA: Diagnosis present

## 2019-07-17 DIAGNOSIS — Z5329 Procedure and treatment not carried out because of patient's decision for other reasons: Secondary | ICD-10-CM | POA: Diagnosis present

## 2019-07-17 DIAGNOSIS — Z885 Allergy status to narcotic agent status: Secondary | ICD-10-CM | POA: Diagnosis not present

## 2019-07-17 DIAGNOSIS — Z992 Dependence on renal dialysis: Secondary | ICD-10-CM | POA: Diagnosis not present

## 2019-07-17 DIAGNOSIS — M549 Dorsalgia, unspecified: Secondary | ICD-10-CM | POA: Diagnosis present

## 2019-07-17 DIAGNOSIS — Z8551 Personal history of malignant neoplasm of bladder: Secondary | ICD-10-CM | POA: Diagnosis not present

## 2019-07-17 DIAGNOSIS — K4090 Unilateral inguinal hernia, without obstruction or gangrene, not specified as recurrent: Secondary | ICD-10-CM | POA: Diagnosis present

## 2019-07-17 DIAGNOSIS — N186 End stage renal disease: Secondary | ICD-10-CM | POA: Diagnosis present

## 2019-07-17 DIAGNOSIS — I509 Heart failure, unspecified: Secondary | ICD-10-CM | POA: Diagnosis present

## 2019-07-17 DIAGNOSIS — E1151 Type 2 diabetes mellitus with diabetic peripheral angiopathy without gangrene: Secondary | ICD-10-CM | POA: Diagnosis present

## 2019-07-17 LAB — CBC WITH DIFFERENTIAL/PLATELET
Abs Immature Granulocytes: 0.03 10*3/uL (ref 0.00–0.07)
Basophils Absolute: 0 10*3/uL (ref 0.0–0.1)
Basophils Relative: 0 %
Eosinophils Absolute: 0 10*3/uL (ref 0.0–0.5)
Eosinophils Relative: 0 %
HCT: 22.5 % — ABNORMAL LOW (ref 39.0–52.0)
Hemoglobin: 7.2 g/dL — ABNORMAL LOW (ref 13.0–17.0)
Immature Granulocytes: 1 %
Lymphocytes Relative: 17 %
Lymphs Abs: 0.8 10*3/uL (ref 0.7–4.0)
MCH: 26.6 pg (ref 26.0–34.0)
MCHC: 32 g/dL (ref 30.0–36.0)
MCV: 83 fL (ref 80.0–100.0)
Monocytes Absolute: 0.2 10*3/uL (ref 0.1–1.0)
Monocytes Relative: 4 %
Neutro Abs: 3.7 10*3/uL (ref 1.7–7.7)
Neutrophils Relative %: 78 %
Platelets: 144 10*3/uL — ABNORMAL LOW (ref 150–400)
RBC: 2.71 MIL/uL — ABNORMAL LOW (ref 4.22–5.81)
RDW: 21 % — ABNORMAL HIGH (ref 11.5–15.5)
WBC: 4.7 10*3/uL (ref 4.0–10.5)
nRBC: 0 % (ref 0.0–0.2)

## 2019-07-17 LAB — GLUCOSE, CAPILLARY
Glucose-Capillary: 117 mg/dL — ABNORMAL HIGH (ref 70–99)
Glucose-Capillary: 117 mg/dL — ABNORMAL HIGH (ref 70–99)
Glucose-Capillary: 166 mg/dL — ABNORMAL HIGH (ref 70–99)
Glucose-Capillary: 101 mg/dL — ABNORMAL HIGH (ref 70–99)

## 2019-07-17 LAB — BASIC METABOLIC PANEL
Anion gap: 10 (ref 5–15)
BUN: 26 mg/dL — ABNORMAL HIGH (ref 8–23)
CO2: 27 mmol/L (ref 22–32)
Calcium: 8 mg/dL — ABNORMAL LOW (ref 8.9–10.3)
Chloride: 100 mmol/L (ref 98–111)
Creatinine, Ser: 3.2 mg/dL — ABNORMAL HIGH (ref 0.61–1.24)
GFR calc Af Amer: 21 mL/min — ABNORMAL LOW (ref >60)
GFR calc non Af Amer: 18 mL/min — ABNORMAL LOW (ref >60)
Glucose, Bld: 138 mg/dL — ABNORMAL HIGH (ref 70–99)
Potassium: 4.4 mmol/L (ref 3.5–5.1)
Sodium: 137 mmol/L (ref 135–145)

## 2019-07-17 MED ORDER — DARBEPOETIN ALFA 60 MCG/0.3ML IJ SOSY
60.0000 ug | PREFILLED_SYRINGE | INTRAMUSCULAR | Status: DC
  Filled 2019-07-17: qty 0.3

## 2019-07-17 MED ORDER — IPRATROPIUM-ALBUTEROL 0.5-2.5 (3) MG/3ML IN SOLN
3.0000 mL | Freq: Once | RESPIRATORY_TRACT | Status: AC
  Administered 2019-07-17: 3 mL via RESPIRATORY_TRACT
  Filled 2019-07-17: qty 3

## 2019-07-17 MED ORDER — OXYCODONE-ACETAMINOPHEN 10-325 MG PO TABS: 1.0000 | ORAL_TABLET | ORAL | 0 refills | Status: DC | PRN

## 2019-07-17 MED ORDER — CHLORHEXIDINE GLUCONATE CLOTH 2 % EX PADS: 6.0000 | MEDICATED_PAD | Freq: Every day | CUTANEOUS | Status: DC

## 2019-07-17 NOTE — Progress Notes (Addendum)
I have seen and examined this patient and agree with the plan of care. TTS schedule  Plan next HD 1/19  Some volume overload  In setting of pulmonary edema and COPD exacerbation     sats appear 96% room air. Hb 7.2  Work up for iron deficiency . Chest X Ray  Lower lobe pumonary edema 1/17  Continues with RLQ pain secondary to inguinal hernia    Sherril Croon 07/17/2019, 10:22 PM  [facility] Progress Note   Subjective:   Patient seen and examined at bedside. Reports breathing is improved but still feeling a little short of breath.  No new complaints.  Denies CP, n/v/d, abdominal pain, dizziness, weakness and fatigue.   Objective Vitals:   07/17/19 0510 07/17/19 0849 07/17/19 1133 07/17/19 1155  BP: 119/71 120/64  122/77  Pulse: 92 91  89  Resp: 20 19  19   Temp: (!) 97.3 F (36.3 C)   98.6 F (37 C)  TempSrc: Oral     SpO2: 97% 93% 90% 92%  Weight:      Height:       Physical Exam General:NAD, chronically ill appearing male Heart:RRR Lungs:+crackles b/l bases through mid lung, normal WOB Abdomen:soft, NTND Extremities:no LE edema Dialysis Access: LU AVF +b   Filed Weights   07/16/19 1535 07/17/19 0045  Weight: 66.5 kg 69.9 kg    Intake/Output Summary (Last 24 hours) at 07/17/2019 1505 Last data filed at 07/17/2019 1300 Gross per 24 hour  Intake 1030 ml  Output --  Net 1030 ml    Additional Objective Labs: Basic Metabolic Panel: Recent Labs  Lab 07/16/19 0700 07/17/19 0011  NA 140 137  K 3.5 4.4  CL 98 100  CO2 30 27  GLUCOSE 159* 138*  BUN 27* 26*  CREATININE 3.92* 3.20*  CALCIUM 8.4* 8.0*    CBC: Recent Labs  Lab 07/16/19 0700 07/16/19 2356  WBC 14.7* 4.7  NEUTROABS 11.1* 3.7  HGB 10.8* 7.2*  HCT 35.5* 22.5*  MCV 85.5 83.0  PLT 280 144*   Blood Culture    Component Value Date/Time   SDES URINE, RANDOM 11/05/2018 0602   SPECREQUEST NONE 11/05/2018 0602   CULT  11/05/2018 0602    NO GROWTH Performed at [facility],  [place] [address] [place], [place] [address]    REPTSTATUS 11/06/2018 FINAL 11/05/2018 0602    CBG: Recent Labs  Lab 07/16/19 1642 07/16/19 2129 07/17/19 0625 07/17/19 1131  GLUCAP 170* 156* 117* 117*    US PELVIS LIMITED (TRANSABDOMINAL ONLY)  Result Date: 07/16/2019 CLINICAL DATA:  Right groin pain with adenopathy the noted on recent CT. EXAM: LIMITED ULTRASOUND OF PELVIS TECHNIQUE: Limited transabdominal ultrasound examination of the pelvis was performed. COMPARISON:  July 07, 2019 FINDINGS: There are several prominent right inguinal lymph nodes. For example there is a 1.8 x 0.6 x 1 cm lymph node in addition to a 2.2 x 1 x 1.3 cm lymph node. These lymph nodes appear to demonstrate a normal fatty hilum and normal morphology. There is a bowel containing right inguinal hernia. IMPRESSION: 1. Bowel containing right inguinal hernia. 2. There are mildly enlarged but morphologically normal right inguinal lymph nodes. Electronically Signed   By: Constance Holster M.D.   On: 07/16/2019 19:17   DG Chest Port 1 View  Result Date: 07/16/2019 CLINICAL DATA:  Sudden onset short of breath.  Dialysis patient. EXAM: PORTABLE CHEST 1 VIEW COMPARISON:  Radiograph 11/04/2018 FINDINGS: Normal mediastinum and cardiac silhouette.  There is diffuse lower lobe airspace disease. Low lung volumes. No pneumothorax. IMPRESSION: Lower lobe pulmonary edema versus multifocal pneumonia. Electronically Signed   By: Suzy Bouchard M.D.   On: 07/16/2019 07:47    Medications: . sodium chloride     . apixaban  5 mg Oral BID  . atorvastatin  80 mg Oral QHS  . Chlorhexidine Gluconate Cloth  6 each Topical Q0600  . docusate sodium  100 mg Oral Daily  . ezetimibe  10 mg Oral Daily  . insulin aspart  0-6 Units Subcutaneous TID WC  . metoprolol succinate  25 mg Oral Daily  . nicotine  14 mg Transdermal Daily  . pantoprazole  40 mg Oral Daily  . sodium chloride flush  3 mL Intravenous Q12H    Dialysis Orders: [facility] on TTS. Time: 3hr 30 min, 180NRe, BFR 350, DFR Auto 1.5, EDW 73.5kg, 2K/ 2.5Ca, UF profile 2, LUE AVF No heparin Venofer 100 mg IV q HD- received 4/5 doses  Assessment/Plan: 1.  Acute on chronic respiratory failure: improving.  Mostly due to pulmonary edema, likely losing weight. Needs lower dry. Continues to have crackles on exam. Net UF removed yesterday 2.5L. Plan for treatment tomorrow with net UF 3L.  2. COPD: Possibly contributing to SOB. Treated empirically for possible exacerbation. Interested in stopping smoking, which is encouraged.  3. R inguinal hernia: Has had multiple clinic/ED visits and continues to complain of pain today. Surgery consulting.  To assess as OP.  4.  ESRD:  TTS schedule, compliant with dialysis. K+ 4.4. HD tomorrow per regular schedule.    5.  Hypertension: BP well controlled.  6.  Anemia: Hgb 10.8>7.2.  Will start esa, aranesp 59mcg qwk.  7.  Metabolic bone disease: Calcium 8.0. Not on outpatient binder or VRDA.  8.  Nutrition:  Renal diet/fluid restrictions 9. Afib: On toprol XL and eliquis. Per primary.  10. DM: on SSI, per primary.   Jen Mow, PA-C [facility] Pager: [phone] 07/17/2019,3:05 PM  LOS: 0 days

## 2019-07-17 NOTE — TOC Initial Note (Addendum)
Transition of Care (TOC) - Initial/Assessment Note    Patient Details  Name: [name]. MRN: [id number] Date of Birth: 10-24-1945  Transition of Care [facility]) CM/SW Contact:    Alberteen Sam, [place] Phone Number: [phone] 07/17/2019, 4:47 PM  Clinical Narrative:                  CSW spoke with patient's daughter [name] who reports she provides support at home for patient and that he's been set up with [facility] in the past and that would be preference.   CSW reached out to [place] with [facility] who reports they had patient last August and would be able to accept again for home health PT, OT, RN, social work and aide to maximize home health services.   Expected Discharge Plan: [place] Barriers to Discharge: Continued Medical Work up   Patient Goals and CMS Choice   CMS Medicare.gov Compare Post Acute Care list provided to:: Patient Represenative (must comment)(Brenda (daughter) [phone]) Choice offered to / list presented to : Adult Children  Expected Discharge Plan and Services Expected Discharge Plan: [place] Choice: [place] arrangements for the past 2 months: Single Family Home                           HH Arranged: PT, OT, RN, Nurse's Aide [place] Agency: [facility] [employer] [facility] [employer] Date [place]: 07/17/19 Time [name]: 1646 Representative spoke with at [place]: [place] Arrangements/Services Living arrangements for the past 2 months: [place] with:: Self Patient language and need for interpreter reviewed:: Yes Do you feel safe going back to the place where you live?: Yes      Need for Family Participation in Patient Care: Yes (Comment) Care giver support system in place?: Yes (comment)   Criminal Activity/Legal Involvement Pertinent to Current Situation/Hospitalization: No - Comment as  needed  Activities of Daily Living   ADL Screening (condition at time of admission) Patient's cognitive ability adequate to safely complete daily activities?: Yes Is the patient deaf or have difficulty hearing?: No Does the patient have difficulty seeing, even when wearing glasses/contacts?: No Patient able to express need for assistance with ADLs?: Yes Independently performs ADLs?: Yes (appropriate for developmental age) Does the patient have difficulty walking or climbing stairs?: No Weakness of Legs: None Weakness of Arms/Hands: None  Permission Sought/Granted Permission sought to share information with : Case Manager, Customer service manager, Family Supports Permission granted to share information with : Yes, Verbal Permission Granted  Share Information with NAME: [name]  Permission granted to share info w AGENCY: [place] granted to share info w Relationship: daughter  Permission granted to share info w Contact Information: [phone]  Emotional Assessment Appearance:: Appears stated age Attitude/Demeanor/Rapport: Unable to Assess Affect (typically observed): Unable to Assess Orientation: : Oriented to Self, Oriented to Place, Oriented to  Time, Oriented to Situation Alcohol / Substance Use: Not Applicable Psych Involvement: No (comment)  Admission diagnosis:  Volume overload [E87.70] Dyspnea, unspecified type [R06.00] Patient Active Problem List   Diagnosis Date Noted  . Volume overload 07/16/2019  . Reducible right inguinal hernia 07/16/2019  . Acute on chronic respiratory failure with hypoxia ([place]) 07/16/2019  . Constipation 05/31/2019  . Plantar fasciitis, bilateral 03/15/2019  . Atrial fibrillation ([place])   .  Normocytic anemia 11/23/2018  . Hemoptysis   . ILD (interstitial lung disease) ([place])   . GIB (gastrointestinal bleeding) 11/05/2018  . Symptomatic anemia 11/04/2018  . DVT (deep venous thrombosis) ([place]) 10/26/2018  . GERD  (gastroesophageal reflux disease) 10/26/2018  . ESRD on dialysis ([place]) 10/26/2018  . Pancytopenia ([place]) 10/26/2018  . Elevated troponin 10/26/2018  . Hypomagnesemia 10/26/2018  . Uremia 10/08/2018  . History of bladder cancer 10/06/2018  . Type II diabetes mellitus with renal manifestations ([place]) 10/06/2018  . CAD (coronary artery disease) 10/06/2018  . ATN (acute tubular necrosis) ([place]) 10/05/2018  . 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 Thoracic disc disease with myelopathy   . Allergic reaction 02/21/2018  . Perennial and seasonal allergic rhinitis 02/21/2018  . Angioedema 02/21/2018  . COPD (chronic obstructive pulmonary disease) ([place]) 02/21/2018  . Bradycardia 01/27/2011  . Carotid artery stenosis 10/31/2009  . ERECTILE DYSFUNCTION, ORGANIC 01/24/2009  . Hyperlipidemia 10/08/2008  . TOBACCO ABUSE 10/08/2008  . HYPERTENSION, BENIGN 10/08/2008  . CAD, NATIVE VESSEL 10/08/2008  . PVD 10/08/2008  . BRUIT 10/08/2008  . Essential hypertension 10/05/2008  . Chronic back pain 10/05/2008   PCP:  [name], MD Pharmacy:   [id number] - [place], [place] [address] [place] [address] Phone: [phone] Fax: [phone]     Social Determinants of Health (SDOH) Interventions    Readmission Risk Interventions Readmission Risk Prevention Plan 10/20/2018  Transportation Screening Complete  Medication Review (RN Care Manager) Complete  PCP or Specialist appointment within 3-5 days of discharge Not Complete  PCP/Specialist Appt Not Complete comments office to call and schedule  HRI or [place] Complete  SW Recovery Care/Counseling Consult Complete  Palliative Care Screening Not [place] Not Applicable  Some recent data might be hidden

## 2019-07-17 NOTE — Discharge Instructions (Signed)
Inguinal Hernia, Adult An inguinal hernia develops when fat or the intestines push through a weak spot in a muscle where your leg meets your lower abdomen (groin). This creates a bulge. This kind of hernia could also be:  In your scrotum, if you are male.  In folds of skin around your vagina, if you are male. There are three types of inguinal hernias:  Hernias that can be pushed back into the abdomen (are reducible). This type rarely causes pain.  Hernias that are not reducible (are incarcerated).  Hernias that are not reducible and lose their blood supply (are strangulated). This type of hernia requires emergency surgery. What are the causes? This condition is caused by having a weak spot in the muscles or tissues in the groin. This weak spot develops over time. The hernia may poke through the weak spot when you suddenly strain your lower abdominal muscles, such as when you:  Lift a heavy object.  Strain to have a bowel movement. Constipation can lead to straining.  Cough. What increases the risk? This condition is more likely to develop in:  Men.  Pregnant women.  People who: ? Are overweight. ? Work in jobs that require long periods of standing or heavy lifting. ? Have had an inguinal hernia before. ? Smoke or have lung disease. These factors can lead to long-lasting (chronic) coughing. What are the signs or symptoms? Symptoms may depend on the size of the hernia. Often, a small inguinal hernia has no symptoms. Symptoms of a larger hernia may include:  A lump in the groin area. This is easier to see when standing. It might not be visible when lying down.  Pain or burning in the groin. This may get worse when lifting, straining, or coughing.  A dull ache or a feeling of pressure in the groin.  In men, an unusual lump in the scrotum. Symptoms of a strangulated inguinal hernia may include:  A bulge in your groin that is very painful and tender to the touch.  A bulge  that turns red or purple.  Fever, nausea, and vomiting.  Inability to have a bowel movement or to pass gas. How is this diagnosed? This condition is diagnosed based on your symptoms, your medical history, and a physical exam. Your health care provider may feel your groin area and ask you to cough. How is this treated? Treatment depends on the size of your hernia and whether you have symptoms. If you do not have symptoms, your health care provider may have you watch your hernia carefully and have you come in for follow-up visits. If your hernia is large or if you have symptoms, you may need surgery to repair the hernia. Follow these instructions at home: Lifestyle  Avoid lifting heavy objects.  Avoid standing for long periods of time.  Do not use any products that contain nicotine or tobacco, such as cigarettes and e-cigarettes. If you need help quitting, ask your health care provider.  Maintain a healthy weight. Preventing constipation  Take actions to prevent constipation. Constipation leads to straining with bowel movements, which can make a hernia worse or cause a hernia repair to break down. Your health care provider may recommend that you: ? Drink enough fluid to keep your urine pale yellow. ? Eat foods that are high in fiber, such as fresh fruits and vegetables, whole grains, and beans. ? Limit foods that are high in fat and processed sugars, such as fried or sweet foods. ? Take an over-the-counter   or prescription medicine for constipation. General instructions  You may try to push the hernia back in place by very gently pressing on it while lying down. Do not try to force the bulge back in if it will not push in easily.  Watch your hernia for any changes in shape, size, or color. Get help right away if you notice any changes.  Take over-the-counter and prescription medicines only as told by your health care provider.  Keep all follow-up visits as told by your health care  provider. This is important. Contact a health care provider if:  You have a fever.  You develop new symptoms.  Your symptoms get worse. Get help right away if:  You have pain in your groin that suddenly gets worse.  You have a bulge in your groin that: ? Suddenly gets bigger and does not get smaller. ? Becomes red or purple or painful to the touch.  You are a man and you have a sudden pain in your scrotum, or the size of your scrotum suddenly changes.  You cannot push the hernia back in place by very gently pressing on it when you are lying down. Do not try to force the bulge back in if it will not push in easily.  You have nausea or vomiting that does not go away.  You have a fast heartbeat.  You cannot have a bowel movement or pass gas. These symptoms may represent a serious problem that is an emergency. Do not wait to see if the symptoms will go away. Get medical help right away. Call your local emergency services (911 in the [place] Summary  An inguinal hernia develops when fat or the intestines push through a weak spot in a muscle where your leg meets your lower abdomen (groin).  This condition is caused by having a weak spot in muscles or tissue in your groin.  Symptoms may depend on the size of the hernia, and they may include pain or swelling in your groin. A small inguinal hernia often has no symptoms.  Treatment may not be needed if you do not have symptoms. If you have symptoms or a large hernia, you may need surgery to repair the hernia.  Avoid lifting heavy objects. Also avoid standing for long amounts of time. This information is not intended to replace advice given to you by your health care provider. Make sure you discuss any questions you have with your health care provider. Document Revised: 07/17/2017 Document Reviewed: 03/17/2017 Elsevier Patient Education  [place] [employer] on my medicine - ELIQUIS (apixaban)  Why was Eliquis prescribed  for you? Eliquis was prescribed for you to reduce the risk of a blood clot forming that can cause a stroke if you have a medical condition called atrial fibrillation (a type of irregular heartbeat).  What do You need to know about Eliquis ? Take your Eliquis TWICE DAILY - one tablet in the morning and one tablet in the evening with or without food. If you have difficulty swallowing the tablet whole please discuss with your pharmacist how to take the medication safely.  Take Eliquis exactly as prescribed by your doctor and DO NOT stop taking Eliquis without talking to the doctor who prescribed the medication.  Stopping may increase your risk of developing a stroke.  Refill your prescription before you run out.  After discharge, you should have regular check-up appointments with your healthcare provider that is prescribing your Eliquis.  In the future your dose  may need to be changed if your kidney function or weight changes by a significant amount or as you get older.  What do you do if you miss a dose? If you miss a dose, take it as soon as you remember on the same day and resume taking twice daily.  Do not take more than one dose of ELIQUIS at the same time to make up a missed dose.  Important Safety Information A possible side effect of Eliquis is bleeding. You should call your healthcare provider right away if you experience any of the following: ? Bleeding from an injury or your nose that does not stop. ? Unusual colored urine (red or dark brown) or unusual colored stools (red or black). ? Unusual bruising for unknown reasons. ? A serious fall or if you hit your head (even if there is no bleeding).  Some medicines may interact with Eliquis and might increase your risk of bleeding or clotting while on Eliquis. To help avoid this, consult your healthcare provider or pharmacist prior to using any new prescription or non-prescription medications, including herbals, vitamins,  non-steroidal anti-inflammatory drugs (NSAIDs) and supplements.  This website has more information on Eliquis (apixaban): http://www.eliquis.com/eliquis/home

## 2019-07-17 NOTE — Progress Notes (Addendum)
Subjective: CC: SOB Patient feels congestion in his chest this morning. Has audible wheezing. Reports that his hernia appears whenever he gets out of bed. He transferred from the chair to the bed to show me this. He reports he is tolerating a diet without any n/v. He is passing flatus and had a normal BM this am.   Objective: Vital signs in last 24 hours: Temp:  [97.1 F (36.2 C)-97.6 F (36.4 C)] 97.3 F (36.3 C) (01/18 0510) Pulse Rate:  [86-97] 91 (01/18 0849) Resp:  [18-27] 19 (01/18 0849) BP: (76-130)/(50-79) 120/64 (01/18 0849) SpO2:  [93 %-100 %] 93 % (01/18 0849) FiO2 (%):  [40 %] 40 % (01/17 1105) Weight:  [66.5 kg-69.9 kg] 69.9 kg (01/18 0045) Last BM Date: 07/16/19  Intake/Output from previous day: 01/17 0701 - 01/18 0700 In: 950 [P.O.:600; IV Piggyback:350] Out: 2580  Intake/Output this shift: Total I/O In: 220 [P.O.:220] Out: -   PE: Gen:  Alert, NAD, pleasant HEENT: EOM's intact, pupils equal and round Card:  RRR, no M/G/R heard Pulm:  Expiratory wheezing throughout  Abd: Soft, NT/ND, +BS GU: Patients RN present as Chaperone. Right inguinal hernia palpable. This was reducible when patient was placed in trendelenburg.  Ext:  No significant LE edema  Psych: A&Ox3 Skin: no rashes noted, warm and dry  Lab Results:  Recent Labs    07/16/19 0700 07/16/19 2356  WBC 14.7* 4.7  HGB 10.8* 7.2*  HCT 35.5* 22.5*  PLT 280 144*   BMET Recent Labs    07/16/19 0700 07/17/19 0011  NA 140 137  K 3.5 4.4  CL 98 100  CO2 30 27  GLUCOSE 159* 138*  BUN 27* 26*  CREATININE 3.92* 3.20*  CALCIUM 8.4* 8.0*   PT/INR No results for input(s): LABPROT, INR in the last 72 hours. CMP     Component Value Date/Time   NA 137 07/17/2019 0011   NA 139 07/20/2016 1609   K 4.4 07/17/2019 0011   CL 100 07/17/2019 0011   CO2 27 07/17/2019 0011   GLUCOSE 138 (H) 07/17/2019 0011   BUN 26 (H) 07/17/2019 0011   BUN 23 07/20/2016 1609   CREATININE 3.20 (H)  07/17/2019 0011   CREATININE 3.25 (HH) 11/23/2018 0958   CREATININE 4.49 (H) 11/14/2018 1520   CALCIUM 8.0 (L) 07/17/2019 0011   PROT 6.7 07/07/2019 1526   ALBUMIN 2.0 (L) 07/07/2019 1526   AST 23 07/07/2019 1526   AST 22 11/23/2018 0958   ALT 11 07/07/2019 1526   ALT 24 11/23/2018 0958   ALKPHOS 74 07/07/2019 1526   BILITOT 0.9 07/07/2019 1526   BILITOT 0.6 11/23/2018 0958   GFRNONAA 18 (L) 07/17/2019 0011   GFRNONAA 18 (L) 11/23/2018 0958   GFRNONAA 12 (L) 11/14/2018 1520   GFRAA 21 (L) 07/17/2019 0011   GFRAA 21 (L) 11/23/2018 0958   GFRAA 14 (L) 11/14/2018 1520   Lipase     Component Value Date/Time   LIPASE 48 07/07/2019 1526       Studies/Results: US PELVIS LIMITED (TRANSABDOMINAL ONLY)  Result Date: 07/16/2019 CLINICAL DATA:  Right groin pain with adenopathy the noted on recent CT. EXAM: LIMITED ULTRASOUND OF PELVIS TECHNIQUE: Limited transabdominal ultrasound examination of the pelvis was performed. COMPARISON:  July 07, 2019 FINDINGS: There are several prominent right inguinal lymph nodes. For example there is a 1.8 x 0.6 x 1 cm lymph node in addition to a 2.2 x 1 x 1.3 cm lymph  node. These lymph nodes appear to demonstrate a normal fatty hilum and normal morphology. There is a bowel containing right inguinal hernia. IMPRESSION: 1. Bowel containing right inguinal hernia. 2. There are mildly enlarged but morphologically normal right inguinal lymph nodes. Electronically Signed   By: Constance Holster M.D.   On: 07/16/2019 19:17   DG Chest Port 1 View  Result Date: 07/16/2019 CLINICAL DATA:  Sudden onset short of breath.  Dialysis patient. EXAM: PORTABLE CHEST 1 VIEW COMPARISON:  Radiograph 11/04/2018 FINDINGS: Normal mediastinum and cardiac silhouette. There is diffuse lower lobe airspace disease. Low lung volumes. No pneumothorax. IMPRESSION: Lower lobe pulmonary edema versus multifocal pneumonia. Electronically Signed   By: Suzy Bouchard M.D.   On: 07/16/2019 07:47      Anti-infectives: Anti-infectives (From admission, onward)   Start     Dose/Rate Route Frequency Ordered Stop   07/16/19 0830  cefTRIAXone (ROCEPHIN) 1 g in sodium chloride 0.9 % 100 mL IVPB     1 g 200 mL/hr over 30 Minutes Intravenous  Once 07/16/19 0819 07/16/19 0936   07/16/19 0830  azithromycin (ZITHROMAX) 500 mg in sodium chloride 0.9 % 250 mL IVPB     500 mg 250 mL/hr over 60 Minutes Intravenous  Once 07/16/19 0819 07/16/19 0957       Assessment/Plan HTN HLD DM COPD - I ordered a duoneb this am  PAD CAD ESRD Anemia of chronic disease - Hgb 7.2 from 10.8   Right inguinal hernia  Hx of ax-fem, fem-fem and inguinodynia - [place] w/ bowel containing right inguinal hernia - Reducible on exam. No evidence of obstruction (tol diet, having bowel function). - Given hx of ax-fem, fem-fem and inguinodynia, and several cardiac risk factors for gen anesthesia, will discuss with MD about plan. However, currently there is no evidence of obstruction, incarceration, or strangulation to require urgent surgery and this can likely be managed as an outpatient   FEN - Renal VTE - SCDs ID - None currently   Addendum: After discussion with MD, will sign off. Patients hernia is reducible. He is tolerating a diet and having bowel function. There is no evidence of obstruction, incarceration, or strangulation to require urgent surgery. Will have him follow up with [place] in the clinic to discuss operative repair as an outpatient.    LOS: 0 days    Jillyn Ledger , [facility] 07/17/2019, 10:39 AM Please see Amion for pager number during day hours 7:00am-4:30pm

## 2019-07-17 NOTE — Telephone Encounter (Signed)
Patient requesting a refill on Oxycodone     LOV:  07/14/19  LRF:   06/15/19

## 2019-07-17 NOTE — Evaluation (Signed)
Physical Therapy Evaluation Patient Details Name: [name]. MRN: [id number] DOB: 13-Sep-1945 Today's Date: 07/17/2019   History of Present Illness  Patient is a 74 y/o male who presents with SOB. Admitted with acute on chronic respiratory failure with hypoxia. PMH includes cancer, chronic back pain, COPD, ESRD on HD, CHF, CAD, DM, hx of enucleation left eyeball, HTN, thoracic disc disease with myelopathy, multiple back surgeries.  Clinical Impression  Patient presents with chronic back pain, groin pain (hernia), dyspnea on exertion, decreased activity tolerance and impaired mobility s/p above. Pt lives alone and reports using RW vs hover round for mobility. Daughter assists with taking him to dialysis and with meals when able. Pt reports difficulty caring for self recently- unable to stand for long periods to wash dishes and has not been able to cook. Today, pt tolerated bed mobility, transfers and gait training with close Min guard for balance/safety. Distance limited due to worsening groin pain and protrusion of hernia with mobility. Sp02 85% on RA at rest during session; donned 2L and SP02 remained >93%. 2-3/4 DOE noted with mobility. Pt reports feeling overwhelmed about current health condition. Will follow acutely to maximize independence and mobility prior to return home.    Follow Up Recommendations Home health PT;Supervision - Intermittent    Equipment Recommendations  None recommended by PT    Recommendations for Other Services       Precautions / Restrictions Precautions Precautions: Fall Precaution Comments: watch 02 Restrictions Weight Bearing Restrictions: No      Mobility  Bed Mobility Overal bed mobility: Needs Assistance Bed Mobility: Rolling;Sidelying to Sit;Sit to Supine Rolling: Supervision Sidelying to sit: Supervision;HOB elevated   Sit to supine: Supervision;HOB elevated   General bed mobility comments: Use of rail, no assist  needed.  Transfers Overall transfer level: Needs assistance Equipment used: Rolling walker (2 wheeled) Transfers: Sit to/from Stand Sit to Stand: Min guard         General transfer comment: Min guard for safety. Stood from [employer] with increased effort. Reports diffculty standing from shower chair at home.  Ambulation/Gait Ambulation/Gait assistance: Min guard Gait Distance (Feet): 40 Feet Assistive device: Rolling walker (2 wheeled) Gait Pattern/deviations: Step-through pattern;Decreased stride length;Trunk flexed Gait velocity: decreased   General Gait Details: Slow, mostly steady gait with RW for support; distance limited due to worsening hernia with movement and pain. 2-3/4 DOE. Sp02 85% on RA, donned 2L/min 02 and SP02 remained >93%. Dizziness reported at end of ambulation.  Stairs            Wheelchair Mobility    Modified Rankin (Stroke Patients Only)       Balance Overall balance assessment: Needs assistance Sitting-balance support: Feet supported;No upper extremity supported Sitting balance-Leahy Scale: Good     Standing balance support: During functional activity Standing balance-Leahy Scale: Poor Standing balance comment: Requires UE support in standing.                             Pertinent Vitals/Pain Pain Assessment: Faces Faces Pain Scale: Hurts little more Pain Location: chronic back pain Pain Descriptors / Indicators: Constant;Discomfort Pain Intervention(s): Repositioned;Monitored during session    Home Living Family/patient expects to be discharged to:: Private residence Living Arrangements: Alone Available Help at Discharge: Family;Available PRN/intermittently(daughter) Type of Home: Mobile home Home Access: Ramped entrance     Home Layout: One level Home Equipment: Cane - single point;Grab bars - tub/shower;Other (comment);Walker - 2 wheels Additional  Comments: hover round scooter    Prior Function Level of  Independence: Independent with assistive device(s)         Comments: Reports difficulty with cooking and standing for long periods due to back pain. Daughter takes pt to dialysis and helps with meals. Uses RW vs hover round scooter. Has dogs     Hand Dominance        Extremity/Trunk Assessment   Upper Extremity Assessment Upper Extremity Assessment: Defer to OT evaluation    Lower Extremity Assessment Lower Extremity Assessment: Generalized weakness("feels like my legs are asleep")    Cervical / Trunk Assessment Cervical / Trunk Assessment: Kyphotic  Communication   Communication: HOH  Cognition Arousal/Alertness: Awake/alert Behavior During Therapy: WFL for tasks assessed/performed Overall Cognitive Status: Within Functional Limits for tasks assessed                                 General Comments: Pt reports feeling overwhelmed about current health situation/status.      General Comments General comments (skin integrity, edema, etc.): Sp02 85% on RA, donned 2L/min 02 [place] and Sp02 remained >93% with activity.    Exercises     Assessment/Plan    PT Assessment Patient needs continued PT services  PT Problem List Decreased strength;Decreased mobility;Pain;Decreased balance;Decreased activity tolerance;Cardiopulmonary status limiting activity       PT Treatment Interventions Therapeutic activities;Therapeutic exercise;Gait training;Balance training;Functional mobility training;Patient/family education    PT Goals (Current goals can be found in the Care Plan section)  Acute Rehab PT Goals Patient Stated Goal: to get better PT Goal Formulation: With patient Time For Goal Achievement: 07/31/19 Potential to Achieve Goals: Fair    Frequency Min 3X/week   Barriers to discharge Decreased caregiver support lives alone, reports his sister will be coming for a few weeks to stay with him at the end of January (from [place]).    Co-evaluation                AM-PAC PT "6 Clicks" Mobility  Outcome Measure Help needed turning from your back to your side while in a flat bed without using bedrails?: None Help needed moving from lying on your back to sitting on the side of a flat bed without using bedrails?: A Little Help needed moving to and from a bed to a chair (including a wheelchair)?: A Little Help needed standing up from a chair using your arms (e.g., wheelchair or bedside chair)?: A Little Help needed to walk in hospital room?: A Little Help needed climbing 3-5 steps with a railing? : A Little 6 Click Score: 19    End of Session Equipment Utilized During Treatment: Gait belt;Oxygen Activity Tolerance: Patient tolerated treatment well;Patient limited by pain Patient left: in bed;with call bell/phone within reach;with bed alarm set Nurse Communication: Mobility status PT Visit Diagnosis: Pain;Muscle weakness (generalized) (M62.81);Difficulty in walking, not elsewhere classified (R26.2) Pain - Right/Left: Right Pain - part of body: (groin)    Time: 4010-2725 PT Time Calculation (min) (ACUTE ONLY): 27 min   Charges:   PT Evaluation $PT Eval Moderate Complexity: 1 Mod PT Treatments $Gait Training: 8-22 mins        Marisa Severin, PT, DPT Acute Rehabilitation Services Pager [phone] Office [place] 07/17/2019, 2:32 PM

## 2019-07-17 NOTE — Progress Notes (Signed)
PROGRESS NOTE    [name].  IEP:[id number] DOB: 1946-01-05 DOA: 07/16/2019 PCP: [name], MD     Brief Narrative:  [name]. is a 74 y.o. male with medical history significant of PAD; HTN; HLD;  L eye enucleation; DM; CAD; COPD; ESRD on TTS HD; bladder cancer (2009); and afib presenting with SOB.   He has had RLQ pain for which he had a negative CT scan.  He was seen by his PCP on 12/11 for this issue and the thought was that this was most likely associated with scar tissue and adhesions associated with his prior bifemoral bypass graft.  He was referred to vascular surgery and was seen on 1/8.  At that visit, he had a large, raised, hard 8x8 cm mass in the R groin that was very TTP.  They were concerned about this very associated with a right inguinal hernia and possibly a strangulated hernia and he was encouraged to go to the ER.  CTA was performed and negative other than 50% luminal narrowing of the abdominal aorta that vascular surgery did not think this was contributing to his pain.  He followed back up with his PCP on 1/15 and the thought was that since he would be such a poor surgical candidate that he should proceed with symptomatic management.  The patient had tried prednisone for a separate issue with apparent improvement and so was started on this.  The plan was to transition to fentanyl if the prednisone was unsuccessful, and he was also going to be referred to surgery.  He describes the RLQ pain as a stabbing pain that starts in the inguinal canal and radiates across to the suprapubic region.  He did attend his full session of HD yesterday.  He took the 2 doses of prednisone so far and this AM awoke overnight with severe SOB.  He wonders if the prednisone caused this episode to happen.  He is currently on BIPAP and feeling much better. He was admitted due to respiratory failure secondary to volume overload as well as right inguinal hernia.  New events last 24 hours /  Subjective: States breathing has improved after HD yesterday. Continues to have RLQ pain.   Assessment & Plan:   Principal Problem:   Acute on chronic respiratory failure with hypoxia ([name]) Active Problems:   Hyperlipidemia   TOBACCO ABUSE   Essential hypertension   Chronic back pain   COPD (chronic obstructive pulmonary disease) ([name])   Type II diabetes mellitus with renal manifestations ([name])   ESRD on dialysis ([name])   Atrial fibrillation ([name])   Volume overload   Reducible right inguinal hernia   Acute on chronic respiratory failure with hypoxia -Required BiPAP on admission. Currently on 2L [place] O2 without distress, continue to wean to room air   Volume overload in an ESRD on HD patient -Nephrology following. Underwent HD yesterday with clinical improvement   COPD -Without wheezing. Monitor.   R inguinal hernia -Pelvic US: Bowel containing right inguinal hernia -General surgery following: no acute need for repair. Follow up with advanced laparoscopic surgeon for follow up   HTN -Continue Toprol XL  Chronic pain -Pain control   Afib -Rate controlled on Toprol XL -Continue Eliquis  HLD  -Continue Lipitor, Zetia  DM -SSI   Tobacco dependence -Nicotine patch     DVT prophylaxis: Eliquis Code Status: Full Family Communication: None at bedside Disposition Plan: Pending clinical improvement, general surgery and nephrology evaluations   Consultants:  General surgery  Nephrology   Antimicrobials:  Anti-infectives (From admission, onward)   Start     Dose/Rate Route Frequency Ordered Stop   07/16/19 0830  cefTRIAXone (ROCEPHIN) 1 g in sodium chloride 0.9 % 100 mL IVPB     1 g 200 mL/hr over 30 Minutes Intravenous  Once 07/16/19 0819 07/16/19 0936   07/16/19 0830  azithromycin (ZITHROMAX) 500 mg in sodium chloride 0.9 % 250 mL IVPB     500 mg 250 mL/hr over 60 Minutes Intravenous  Once 07/16/19 0819 07/16/19 0957         Objective: Vitals:   07/17/19 0510 07/17/19 0849 07/17/19 1133 07/17/19 1155  BP: 119/71 120/64  122/77  Pulse: 92 91  89  Resp: 20 19  19   Temp: (!) 97.3 F (36.3 C)   98.6 F (37 C)  TempSrc: Oral     SpO2: 97% 93% 90% 92%  Weight:      Height:        Intake/Output Summary (Last 24 hours) at 07/17/2019 1552 Last data filed at 07/17/2019 1300 Gross per 24 hour  Intake 1030 ml  Output --  Net 1030 ml   Filed Weights   07/16/19 1535 07/17/19 0045  Weight: 66.5 kg 69.9 kg    Examination:  General exam: Appears calm and comfortable  Respiratory system: Clear to auscultation. Respiratory effort normal. No respiratory distress. No conversational dyspnea. On 2L [place] O2  Cardiovascular system: S1 & S2 heard. No murmurs. No pedal edema. Gastrointestinal system: Abdomen is nondistended, soft and nontender. Normal bowel sounds heard. Central nervous system: Alert and oriented. No focal neurological deficits. Speech clear.  Extremities: Symmetric in appearance  Skin: No rashes, lesions or ulcers on exposed skin  Psychiatry: Judgement and insight appear normal. Mood & affect appropriate.   Data Reviewed: I have personally reviewed following labs and imaging studies  CBC: Recent Labs  Lab 07/16/19 0700 07/16/19 2356  WBC 14.7* 4.7  NEUTROABS 11.1* 3.7  HGB 10.8* 7.2*  HCT 35.5* 22.5*  MCV 85.5 83.0  PLT 280 951*   Basic Metabolic Panel: Recent Labs  Lab 07/16/19 0700 07/17/19 0011  NA 140 137  K 3.5 4.4  CL 98 100  CO2 30 27  GLUCOSE 159* 138*  BUN 27* 26*  CREATININE 3.92* 3.20*  CALCIUM 8.4* 8.0*   GFR: Estimated Creatinine Clearance: 19.7 mL/min (A) (by C-G formula based on SCr of 3.2 mg/dL (H)). Liver Function Tests: No results for input(s): AST, ALT, ALKPHOS, BILITOT, PROT, ALBUMIN in the last 168 hours. No results for input(s): LIPASE, AMYLASE in the last 168 hours. No results for input(s): AMMONIA in the last 168 hours. Coagulation Profile: No  results for input(s): INR, PROTIME in the last 168 hours. Cardiac Enzymes: No results for input(s): CKTOTAL, CKMB, CKMBINDEX, TROPONINI in the last 168 hours. BNP (last 3 results) No results for input(s): PROBNP in the last 8760 hours. HbA1C: No results for input(s): HGBA1C in the last 72 hours. CBG: Recent Labs  Lab 07/16/19 1642 07/16/19 2129 07/17/19 0625 07/17/19 1131  GLUCAP 170* 156* 117* 117*   Lipid Profile: No results for input(s): CHOL, HDL, LDLCALC, TRIG, CHOLHDL, LDLDIRECT in the last 72 hours. Thyroid Function Tests: No results for input(s): TSH, T4TOTAL, FREET4, T3FREE, THYROIDAB in the last 72 hours. Anemia Panel: No results for input(s): VITAMINB12, FOLATE, FERRITIN, TIBC, IRON, RETICCTPCT in the last 72 hours. Sepsis Labs: No results for input(s): PROCALCITON, LATICACIDVEN in the last 168 hours.  Recent  Results (from the past 240 hour(s))  Respiratory Panel by RT PCR (Flu A&B, Covid) - Nasopharyngeal Swab     Status: None   Collection Time: 07/16/19  7:15 AM   Specimen: Nasopharyngeal Swab  Result Value Ref Range Status   SARS Coronavirus 2 by RT PCR NEGATIVE NEGATIVE Final    Comment: (NOTE) SARS-CoV-2 target nucleic acids are NOT DETECTED. The SARS-CoV-2 RNA is generally detectable in upper respiratoy specimens during the acute phase of infection. The lowest concentration of SARS-CoV-2 viral copies this assay can detect is 131 copies/mL. A negative result does not preclude SARS-Cov-2 infection and should not be used as the sole basis for treatment or other patient management decisions. A negative result may occur with  improper specimen collection/handling, submission of specimen other than nasopharyngeal swab, presence of viral mutation(s) within the areas targeted by this assay, and inadequate number of viral copies (<131 copies/mL). A negative result must be combined with clinical observations, patient history, and epidemiological information.  The expected result is Negative. Fact Sheet for Patients:  PinkCheek.be Fact Sheet for Healthcare Providers:  GravelBags.it This test is not yet ap proved or cleared by the [place] FDA and  has been authorized for detection and/or diagnosis of SARS-CoV-2 by FDA under an Emergency Use Authorization (EUA). This EUA will remain  in effect (meaning this test can be used) for the duration of the COVID-19 declaration under Section 564(b)(1) of the Act, 21 U.S.C. section 360bbb-3(b)(1), unless the authorization is terminated or revoked sooner.    Influenza A by PCR NEGATIVE NEGATIVE Final   Influenza B by PCR NEGATIVE NEGATIVE Final    Comment: (NOTE) The Xpert Xpress SARS-CoV-2/FLU/RSV assay is intended as an aid in  the diagnosis of influenza from Nasopharyngeal swab specimens and  should not be used as a sole basis for treatment. Nasal washings and  aspirates are unacceptable for Xpert Xpress SARS-CoV-2/FLU/RSV  testing. Fact Sheet for Patients: PinkCheek.be Fact Sheet for Healthcare Providers: GravelBags.it This test is not yet approved or cleared by the [place] FDA and  has been authorized for detection and/or diagnosis of SARS-CoV-2 by  FDA under an Emergency Use Authorization (EUA). This EUA will remain  in effect (meaning this test can be used) for the duration of the  Covid-19 declaration under Section 564(b)(1) of the Act, 21  U.S.C. section 360bbb-3(b)(1), unless the authorization is  terminated or revoked. Performed at [facility], [place] [address], [place], [place] [address]       Radiology Studies: US PELVIS LIMITED (TRANSABDOMINAL ONLY)  Result Date: 07/16/2019 CLINICAL DATA:  Right groin pain with adenopathy the noted on recent CT. EXAM: LIMITED ULTRASOUND OF PELVIS TECHNIQUE: Limited transabdominal ultrasound examination of the pelvis  was performed. COMPARISON:  July 07, 2019 FINDINGS: There are several prominent right inguinal lymph nodes. For example there is a 1.8 x 0.6 x 1 cm lymph node in addition to a 2.2 x 1 x 1.3 cm lymph node. These lymph nodes appear to demonstrate a normal fatty hilum and normal morphology. There is a bowel containing right inguinal hernia. IMPRESSION: 1. Bowel containing right inguinal hernia. 2. There are mildly enlarged but morphologically normal right inguinal lymph nodes. Electronically Signed   By: Constance Holster M.D.   On: 07/16/2019 19:17   DG Chest Port 1 View  Result Date: 07/16/2019 CLINICAL DATA:  Sudden onset short of breath.  Dialysis patient. EXAM: PORTABLE CHEST 1 VIEW COMPARISON:  Radiograph 11/04/2018 FINDINGS: Normal mediastinum and  cardiac silhouette. There is diffuse lower lobe airspace disease. Low lung volumes. No pneumothorax. IMPRESSION: Lower lobe pulmonary edema versus multifocal pneumonia. Electronically Signed   By: Suzy Bouchard M.D.   On: 07/16/2019 07:47      Scheduled Meds: . apixaban  5 mg Oral BID  . atorvastatin  80 mg Oral QHS  . Chlorhexidine Gluconate Cloth  6 each Topical Q0600  . docusate sodium  100 mg Oral Daily  . ezetimibe  10 mg Oral Daily  . insulin aspart  0-6 Units Subcutaneous TID WC  . metoprolol succinate  25 mg Oral Daily  . nicotine  14 mg Transdermal Daily  . pantoprazole  40 mg Oral Daily  . sodium chloride flush  3 mL Intravenous Q12H   Continuous Infusions: . sodium chloride       LOS: 0 days      Time spent: 40 minutes   Dessa Phi, DO [facility] 07/17/2019, 3:52 PM   Available via Epic secure chat 7am-7pm After these hours, please refer to coverage provider listed on [email or web address]

## 2019-07-18 LAB — CBC
HCT: 25.5 % — ABNORMAL LOW (ref 39.0–52.0)
Hemoglobin: 8.1 g/dL — ABNORMAL LOW (ref 13.0–17.0)
MCH: 27 pg (ref 26.0–34.0)
MCHC: 31.8 g/dL (ref 30.0–36.0)
MCV: 85 fL (ref 80.0–100.0)
Platelets: 201 10*3/uL (ref 150–400)
RBC: 3 MIL/uL — ABNORMAL LOW (ref 4.22–5.81)
RDW: 21.2 % — ABNORMAL HIGH (ref 11.5–15.5)
WBC: 8.5 10*3/uL (ref 4.0–10.5)
nRBC: 0.2 % (ref 0.0–0.2)

## 2019-07-18 LAB — RENAL FUNCTION PANEL
Albumin: 2.4 g/dL — ABNORMAL LOW (ref 3.5–5.0)
Anion gap: 16 — ABNORMAL HIGH (ref 5–15)
BUN: 60 mg/dL — ABNORMAL HIGH (ref 8–23)
CO2: 24 mmol/L (ref 22–32)
Calcium: 8.2 mg/dL — ABNORMAL LOW (ref 8.9–10.3)
Chloride: 97 mmol/L — ABNORMAL LOW (ref 98–111)
Creatinine, Ser: 5.68 mg/dL — ABNORMAL HIGH (ref 0.61–1.24)
GFR calc Af Amer: 11 mL/min — ABNORMAL LOW (ref >60)
GFR calc non Af Amer: 9 mL/min — ABNORMAL LOW (ref >60)
Glucose, Bld: 103 mg/dL — ABNORMAL HIGH (ref 70–99)
Phosphorus: 3.7 mg/dL (ref 2.5–4.6)
Potassium: 4 mmol/L (ref 3.5–5.1)
Sodium: 137 mmol/L (ref 135–145)

## 2019-07-18 LAB — GLUCOSE, CAPILLARY
Glucose-Capillary: 113 mg/dL — ABNORMAL HIGH (ref 70–99)
Glucose-Capillary: 78 mg/dL (ref 70–99)
Glucose-Capillary: 90 mg/dL (ref 70–99)

## 2019-07-18 MED ORDER — DARBEPOETIN ALFA 60 MCG/0.3ML IJ SOSY
PREFILLED_SYRINGE | INTRAMUSCULAR | Status: AC
  Administered 2019-07-18: 60 ug via INTRAVENOUS
  Filled 2019-07-18: qty 0.3

## 2019-07-18 MED ORDER — ALBUMIN HUMAN 25 % IV SOLN
12.5000 g | Freq: Two times a day (BID) | INTRAVENOUS | Status: DC | PRN
  Filled 2019-07-18: qty 50

## 2019-07-18 MED ORDER — ALBUMIN HUMAN 25 % IV SOLN
INTRAVENOUS | Status: AC
  Administered 2019-07-18: 25 g via INTRAVENOUS
  Filled 2019-07-18: qty 100

## 2019-07-18 MED ORDER — FUROSEMIDE 10 MG/ML IJ SOLN: 20.0000 mg | Freq: Once | INTRAMUSCULAR | Status: DC

## 2019-07-18 MED ORDER — SODIUM CHLORIDE 0.9 % IV SOLN: 100.0000 mL | INTRAVENOUS | Status: DC | PRN

## 2019-07-18 MED ORDER — LIDOCAINE-PRILOCAINE 2.5-2.5 % EX CREA: 1.0000 "application " | TOPICAL_CREAM | CUTANEOUS | Status: DC | PRN

## 2019-07-18 MED ORDER — LIDOCAINE HCL (PF) 1 % IJ SOLN: 5.0000 mL | INTRAMUSCULAR | Status: DC | PRN

## 2019-07-18 MED ORDER — ALTEPLASE 2 MG IJ SOLR: 2.0000 mg | Freq: Once | INTRAMUSCULAR | Status: DC | PRN

## 2019-07-18 MED ORDER — HEPARIN SODIUM (PORCINE) 1000 UNIT/ML DIALYSIS: 1000.0000 [IU] | INTRAMUSCULAR | Status: DC | PRN

## 2019-07-18 MED ORDER — ALBUMIN HUMAN 25 % IV SOLN: 25.0000 g | Freq: Once | INTRAVENOUS | Status: AC

## 2019-07-18 MED ORDER — PENTAFLUOROPROP-TETRAFLUOROETH EX AERO: 1.0000 "application " | INHALATION_SPRAY | CUTANEOUS | Status: DC | PRN

## 2019-07-18 NOTE — Plan of Care (Signed)
  Problem: Education: Goal: Knowledge of General Education information will improve Description: Including pain rating scale, medication(s)/side effects and non-pharmacologic comfort measures Outcome: Progressing   Problem: Health Behavior/Discharge Planning: Goal: Ability to manage health-related needs will improve Outcome: Progressing   Problem: Clinical Measurements: Goal: Will remain free from infection Outcome: Progressing Goal: Diagnostic test results will improve Outcome: Progressing Goal: Cardiovascular complication will be avoided Outcome: Progressing   Problem: Activity: Goal: Risk for activity intolerance will decrease Outcome: Progressing   Problem: Nutrition: Goal: Adequate nutrition will be maintained Outcome: Progressing   Problem: Coping: Goal: Level of anxiety will decrease Outcome: Progressing   Problem: Elimination: Goal: Will not experience complications related to bowel motility Outcome: Progressing Goal: Will not experience complications related to urinary retention Outcome: Progressing   Problem: Pain Managment: Goal: General experience of comfort will improve Outcome: Progressing   Problem: Safety: Goal: Ability to remain free from injury will improve Outcome: Progressing   Problem: Skin Integrity: Goal: Risk for impaired skin integrity will decrease Outcome: Progressing

## 2019-07-18 NOTE — Progress Notes (Signed)
PROGRESS NOTE    [name].  ZOX:[id number] DOB: [birth year] DOA: 07/16/2019 PCP: [name], MD     Brief Narrative:  [name] Benzel. is a 74 y.o. male with medical history significant of PAD; HTN; HLD;  L eye enucleation; DM; CAD; COPD; ESRD on TTS HD; bladder cancer (2009); and afib presenting with SOB.   He has had RLQ pain for which he had a negative CT scan.  He was seen by his PCP on 12/11 for this issue and the thought was that this was most likely associated with scar tissue and adhesions associated with his prior bifemoral bypass graft.  He was referred to vascular surgery and was seen on 1/8.  At that visit, he had a large, raised, hard 8x8 cm mass in the R groin that was very TTP.  They were concerned about this very associated with a right inguinal hernia and possibly a strangulated hernia and he was encouraged to go to the ER.  CTA was performed and negative other than 50% luminal narrowing of the abdominal aorta that vascular surgery did not think this was contributing to his pain.  He followed back up with his PCP on 1/15 and the thought was that since he would be such a poor surgical candidate that he should proceed with symptomatic management.  The patient had tried prednisone for a separate issue with apparent improvement and so was started on this.  The plan was to transition to fentanyl if the prednisone was unsuccessful, and he was also going to be referred to surgery.  He describes the RLQ pain as a stabbing pain that starts in the inguinal canal and radiates across to the suprapubic region.  He did attend his full session of HD yesterday.  He took the 2 doses of prednisone so far and this AM awoke overnight with severe SOB.  He wonders if the prednisone caused this episode to happen.  He is currently on BIPAP and feeling much better. He was admitted due to respiratory failure secondary to volume overload as well as right inguinal hernia.  New events last 24 hours /  Subjective: Had an episode of dyspnea, diaphoresis, agitation this morning.  Patient seen and examined in hemodialysis.  He states that he feels much better since being in dialysis, denies shortness of breath on my examination.  Assessment & Plan:   Principal Problem:   Acute on chronic respiratory failure with hypoxia ([name]) Active Problems:   Hyperlipidemia   TOBACCO ABUSE   Essential hypertension   Chronic back pain   COPD (chronic obstructive pulmonary disease) ([name])   Type II diabetes mellitus with renal manifestations ([name])   ESRD on dialysis ([name])   Atrial fibrillation ([name])   Volume overload   Reducible right inguinal hernia   Acute on chronic respiratory failure with hypoxia -Required BiPAP on admission. Currently on 3L [place] O2 without distress, continue to wean to room air    Volume overload in an ESRD on HD patient -Nephrology following.  Undergoing dialysis today  COPD -Without wheezing. Monitor.   R inguinal hernia -Pelvic US: Bowel containing right inguinal hernia -General surgery following: no acute need for repair. Follow up with advanced laparoscopic surgeon for follow up   HTN -Continue Toprol XL  Chronic pain -Pain control   Afib -Rate controlled on Toprol XL -Continue Eliquis  HLD  -Continue Lipitor, Zetia  DM -SSI   Tobacco dependence -Nicotine patch     DVT prophylaxis: Eliquis Code Status:  Full Family Communication: None at bedside Disposition Plan: Pending clinical improvement and weaning off oxygen   Consultants:   General surgery  Nephrology   Antimicrobials:  Anti-infectives (From admission, onward)   Start     Dose/Rate Route Frequency Ordered Stop   07/16/19 0830  cefTRIAXone (ROCEPHIN) 1 g in sodium chloride 0.9 % 100 mL IVPB     1 g 200 mL/hr over 30 Minutes Intravenous  Once 07/16/19 0819 07/16/19 0936   07/16/19 0830  azithromycin (ZITHROMAX) 500 mg in sodium chloride 0.9 % 250 mL IVPB     500 mg 250  mL/hr over 60 Minutes Intravenous  Once 07/16/19 0819 07/16/19 0957       Objective: Vitals:   07/18/19 0830 07/18/19 0900 07/18/19 0915 07/18/19 0930  BP: 120/64 (!) 101/58 (!) 81/49 108/65  Pulse: 86 78 78 78  Resp:      Temp:      TempSrc:      SpO2:      Weight:      Height:        Intake/Output Summary (Last 24 hours) at 07/18/2019 1023 Last data filed at 07/18/2019 0317 Gross per 24 hour  Intake 693 ml  Output --  Net 693 ml   Filed Weights   07/17/19 0045 07/18/19 0438 07/18/19 0700  Weight: 69.9 kg 70.8 kg 71.5 kg    Examination: General exam: Appears calm and comfortable  Respiratory system: Clear to auscultation anteriorly. Respiratory effort normal.  On nasal cannula O2  Cardiovascular system: S1 & S2 heard, RRR. No pedal edema. Gastrointestinal system: Abdomen is nondistended, soft. Normal bowel sounds heard. Central nervous system: Alert and oriented. Non focal exam. Speech clear  Extremities: Symmetric in appearance bilaterally  Skin: No rashes, lesions or ulcers on exposed skin  Psychiatry: Judgement and insight appear stable. Mood & affect appropriate.      Data Reviewed: I have personally reviewed following labs and imaging studies  CBC: Recent Labs  Lab 07/16/19 0700 07/16/19 2356 07/18/19 0856  WBC 14.7* 4.7 8.5  NEUTROABS 11.1* 3.7  --   HGB 10.8* 7.2* 8.1*  HCT 35.5* 22.5* 25.5*  MCV 85.5 83.0 85.0  PLT 280 144* 161   Basic Metabolic Panel: Recent Labs  Lab 07/16/19 0700 07/17/19 0011 07/18/19 0856  NA 140 137 137  K 3.5 4.4 4.0  CL 98 100 97*  CO2 30 27 24   GLUCOSE 159* 138* 103*  BUN 27* 26* 60*  CREATININE 3.92* 3.20* 5.68*  CALCIUM 8.4* 8.0* 8.2*  PHOS  --   --  3.7   GFR: Estimated Creatinine Clearance: 11.4 mL/min (A) (by C-G formula based on SCr of 5.68 mg/dL (H)). Liver Function Tests: Recent Labs  Lab 07/18/19 0856  ALBUMIN 2.4*   No results for input(s): LIPASE, AMYLASE in the last 168 hours. No results  for input(s): AMMONIA in the last 168 hours. Coagulation Profile: No results for input(s): INR, PROTIME in the last 168 hours. Cardiac Enzymes: No results for input(s): CKTOTAL, CKMB, CKMBINDEX, TROPONINI in the last 168 hours. BNP (last 3 results) No results for input(s): PROBNP in the last 8760 hours. HbA1C: No results for input(s): HGBA1C in the last 72 hours. CBG: Recent Labs  Lab 07/17/19 0625 07/17/19 1131 07/17/19 1709 07/17/19 2123 07/18/19 0640  GLUCAP 117* 117* 166* 101* 113*   Lipid Profile: No results for input(s): CHOL, HDL, LDLCALC, TRIG, CHOLHDL, LDLDIRECT in the last 72 hours. Thyroid Function Tests: No results for input(s): TSH,  T4TOTAL, FREET4, T3FREE, THYROIDAB in the last 72 hours. Anemia Panel: No results for input(s): VITAMINB12, FOLATE, FERRITIN, TIBC, IRON, RETICCTPCT in the last 72 hours. Sepsis Labs: No results for input(s): PROCALCITON, LATICACIDVEN in the last 168 hours.  Recent Results (from the past 240 hour(s))  Respiratory Panel by RT PCR (Flu A&B, Covid) - Nasopharyngeal Swab     Status: None   Collection Time: 07/16/19  7:15 AM   Specimen: Nasopharyngeal Swab  Result Value Ref Range Status   SARS Coronavirus 2 by RT PCR NEGATIVE NEGATIVE Final    Comment: (NOTE) SARS-CoV-2 target nucleic acids are NOT DETECTED. The SARS-CoV-2 RNA is generally detectable in upper respiratoy specimens during the acute phase of infection. The lowest concentration of SARS-CoV-2 viral copies this assay can detect is 131 copies/mL. A negative result does not preclude SARS-Cov-2 infection and should not be used as the sole basis for treatment or other patient management decisions. A negative result may occur with  improper specimen collection/handling, submission of specimen other than nasopharyngeal swab, presence of viral mutation(s) within the areas targeted by this assay, and inadequate number of viral copies (<131 copies/mL). A negative result must be  combined with clinical observations, patient history, and epidemiological information. The expected result is Negative. Fact Sheet for Patients:  PinkCheek.be Fact Sheet for Healthcare Providers:  GravelBags.it This test is not yet ap proved or cleared by the [place] FDA and  has been authorized for detection and/or diagnosis of SARS-CoV-2 by FDA under an Emergency Use Authorization (EUA). This EUA will remain  in effect (meaning this test can be used) for the duration of the COVID-19 declaration under Section 564(b)(1) of the Act, 21 U.S.C. section 360bbb-3(b)(1), unless the authorization is terminated or revoked sooner.    Influenza A by PCR NEGATIVE NEGATIVE Final   Influenza B by PCR NEGATIVE NEGATIVE Final    Comment: (NOTE) The Xpert Xpress SARS-CoV-2/FLU/RSV assay is intended as an aid in  the diagnosis of influenza from Nasopharyngeal swab specimens and  should not be used as a sole basis for treatment. Nasal washings and  aspirates are unacceptable for Xpert Xpress SARS-CoV-2/FLU/RSV  testing. Fact Sheet for Patients: PinkCheek.be Fact Sheet for Healthcare Providers: GravelBags.it This test is not yet approved or cleared by the [place] FDA and  has been authorized for detection and/or diagnosis of SARS-CoV-2 by  FDA under an Emergency Use Authorization (EUA). This EUA will remain  in effect (meaning this test can be used) for the duration of the  Covid-19 declaration under Section 564(b)(1) of the Act, 21  U.S.C. section 360bbb-3(b)(1), unless the authorization is  terminated or revoked. Performed at [facility], [place] [address] [place], [place] [address]       Radiology Studies: US PELVIS LIMITED (TRANSABDOMINAL ONLY)  Result Date: 07/16/2019 CLINICAL DATA:  Right groin pain with adenopathy the noted on recent CT. EXAM: LIMITED  ULTRASOUND OF PELVIS TECHNIQUE: Limited transabdominal ultrasound examination of the pelvis was performed. COMPARISON:  July 07, 2019 FINDINGS: There are several prominent right inguinal lymph nodes. For example there is a 1.8 x 0.6 x 1 cm lymph node in addition to a 2.2 x 1 x 1.3 cm lymph node. These lymph nodes appear to demonstrate a normal fatty hilum and normal morphology. There is a bowel containing right inguinal hernia. IMPRESSION: 1. Bowel containing right inguinal hernia. 2. There are mildly enlarged but morphologically normal right inguinal lymph nodes. Electronically Signed   By: Constance Holster  M.D.   On: 07/16/2019 19:17      Scheduled Meds: . apixaban  5 mg Oral BID  . atorvastatin  80 mg Oral QHS  . Chlorhexidine Gluconate Cloth  6 each Topical Q0600  . darbepoetin (ARANESP) injection - DIALYSIS  60 mcg Intravenous Q Tue-HD  . docusate sodium  100 mg Oral Daily  . ezetimibe  10 mg Oral Daily  . furosemide  20 mg Intravenous Once  . insulin aspart  0-6 Units Subcutaneous TID WC  . metoprolol succinate  25 mg Oral Daily  . nicotine  14 mg Transdermal Daily  . pantoprazole  40 mg Oral Daily  . sodium chloride flush  3 mL Intravenous Q12H   Continuous Infusions: . sodium chloride    . sodium chloride    . sodium chloride    . albumin human       LOS: 1 day      Time spent: 25 minutes   Dessa Phi, DO [facility] 07/18/2019, 10:23 AM   Available via Epic secure chat 7am-7pm After these hours, please refer to coverage provider listed on [email or web address]

## 2019-07-18 NOTE — Progress Notes (Signed)
Patient complains of dyspnea and headache.  Pt diaphoretic, agitated. O2Sat 90% Room air.  Started 3 L via nasal cannula.  Will page provider.

## 2019-07-18 NOTE — Progress Notes (Signed)
Pt. Stated he was leaving the hospital to go home. RN informed patient that he was currently being treated and that it was important for him to stay at the hospital. Pt. Stated he felt better and was going home. Pt. Refused to sign AMA consent or let staff assist him with changing into his clothes, still in gown. Pt. Also refused to let staff remove telemetry monitor, however, staff was able to remove monitor with assistance of NT.  PIV in place as he became combative while getting on elevator. Pt. Refused to let RN remove IV. pt. Left the unit. Sandy and on call for [facility] paged to make aware. Pt. Was found by security on first floor. Pt. Aggressive and hostile at the time. Wheel chair provided for patient and transported back to [place] NP paged to make aware of pts. Behavior and request to leave AMA. NP to floor to speak with patient. NP able to obtain pt. Signature for [facility] consent. NP also able to remove PIV. Pt. Discharged from unit in stable condition.

## 2019-07-18 NOTE — Progress Notes (Signed)
[facility] Progress Note   [facility]: 3hr 30 min, 180NRe, BFR 350, DFR Auto 1.5, EDW 73.5kg, 2K/ 2.5Ca, UF profile 2, LUE AVF No heparin Venofer 100 mg IV q HD- received 4/5 doses  Assessment/ Plan:   1. Acute on chronic respiratory failure:improving.  Mostly due to pulmonary edema, likely losing weight. Needs lower dry. Net UF removed 1/17 2.6   2. COPD:Possibly contributing to SOB. Treated empirically for possible exacerbation. Interested in stopping smoking, which is encouraged.  3. R inguinal hernia:Has had multiple clinic/ED visits and continues to complain of pain today. Surgery consulting.  To assess as OP.  4. ESRD:TTS schedule, compliant with dialysis. K+ 4.4.   Seen on HD today 118/57 3k  4.5L (goal net 4L)  Already well below EDW; need to give albumin with HD 1-2x/tx   5. Hypertension: BP well controlled.  6. Anemia:Hgb 10.8>7.2.  Will start esa, aranesp 44mcg qwk.  7. Metabolic bone disease:Calcium 8.0. Not on outpatient binder or VRDA. 8. Nutrition:Renal diet/fluid restrictions 9. Afib: On toprol XL and eliquis. Per primary.  10. DM:on SSI, per primary.   Subjective:   Dizzy last night and this AM; need to weigh on floor scale with assistance and record.   Objective:   BP 108/65   Pulse 78   Temp 97.7 F (36.5 C) (Oral)   Resp 16   Ht 5\' 11"  (1.803 m)   Wt 71.5 kg   SpO2 98%   BMI 21.98 kg/m   Intake/Output Summary (Last 24 hours) at 07/18/2019 0941 Last data filed at 07/18/2019 3500 Gross per 24 hour  Intake 693 ml  Output --  Net 693 ml   Weight change: 4.218 kg  Physical Exam: General:NAD, chronically ill appearing male Heart:RRR Lungs:+crackles b/l bases through mid lung, normal WOB Abdomen:soft, NTND Extremities:no LE edema Dialysis Access: LU AVF +b   Imaging: US PELVIS LIMITED (TRANSABDOMINAL ONLY)  Result Date: 07/16/2019 CLINICAL DATA:  Right groin pain  with adenopathy the noted on recent CT. EXAM: LIMITED ULTRASOUND OF PELVIS TECHNIQUE: Limited transabdominal ultrasound examination of the pelvis was performed. COMPARISON:  July 07, 2019 FINDINGS: There are several prominent right inguinal lymph nodes. For example there is a 1.8 x 0.6 x 1 cm lymph node in addition to a 2.2 x 1 x 1.3 cm lymph node. These lymph nodes appear to demonstrate a normal fatty hilum and normal morphology. There is a bowel containing right inguinal hernia. IMPRESSION: 1. Bowel containing right inguinal hernia. 2. There are mildly enlarged but morphologically normal right inguinal lymph nodes. Electronically Signed   By: Constance Holster M.D.   On: 07/16/2019 19:17    Labs: BMET Recent Labs  Lab 07/16/19 0700 07/17/19 0011 07/18/19 0856  NA 140 137 137  K 3.5 4.4 4.0  CL 98 100 97*  CO2 30 27 24   GLUCOSE 159* 138* 103*  BUN 27* 26* 60*  CREATININE 3.92* 3.20* 5.68*  CALCIUM 8.4* 8.0* 8.2*  PHOS  --   --  3.7   CBC Recent Labs  Lab 07/16/19 0700 07/16/19 2356  WBC 14.7* 4.7  NEUTROABS 11.1* 3.7  HGB 10.8* 7.2*  HCT 35.5* 22.5*  MCV 85.5 83.0  PLT 280 144*    Medications:    . apixaban  5 mg Oral BID  . atorvastatin  80 mg Oral QHS  . Chlorhexidine Gluconate Cloth  6 each Topical Q0600  . darbepoetin (ARANESP) injection - DIALYSIS  60 mcg Intravenous  Q Tue-HD  . docusate sodium  100 mg Oral Daily  . ezetimibe  10 mg Oral Daily  . furosemide  20 mg Intravenous Once  . insulin aspart  0-6 Units Subcutaneous TID WC  . metoprolol succinate  25 mg Oral Daily  . nicotine  14 mg Transdermal Daily  . pantoprazole  40 mg Oral Daily  . sodium chloride flush  3 mL Intravenous Q12H      Otelia Santee, MD 07/18/2019, 9:41 AM

## 2019-07-18 NOTE — Progress Notes (Signed)
PT Cancellation Note  Patient Details Name: [name]. MRN: [id number] DOB: 05-10-1946   Cancelled Treatment:      Pt off the unit for hemodialysis. Will attempt to see patient at later time/date as schedule allows.    Netta Corrigan, PT, DPT, CSRS 07/18/2019, 9:45 AM

## 2019-07-18 NOTE — Progress Notes (Addendum)
RN paged me that pt wanted to leave AMA. He came to the nurse's desk and said he was going to leave. He was bleeding from IV site at that time, and before RN could remove IV or this NP could talk with him, he became combative and eloped from the floor.  Security was called by Therapist, sports.  KJKG, NP [facility] Update: Security found pt and escorted him back to his room. He was belligerent. NP went to room to talk to pt. NP asked everyone in the room to leave because the crowd was worse for the pt. At that point, pt was very calm and cooperative. NP had a long discussion with pt that we all just want what is best for him and we would like him to stay so that we can take care of him. Pt stated nothing was particularly wrong but he was mad about "being detained". NP assessed pt's orientation and he is alert and oriented x 4. He could tell Probation officer the street names surrounding [facility]. He could tell Probation officer why he was originally was brought to the hospital. He could tell Probation officer what might happen to him if he left (worse breathing or death). He denied any suicidal or homicidal ideations. He stated he had a friend a block from the hospital and that is where he is going.  After an extensive conversation with pt and examining him for orientation and insight with his disease, it was determined that we can not detain him. He allowed his IV to be removed and a dressing placed. He signed AMA form after this NP explained what it is. He left the unit quietly.  KJKG, NP [facility]

## 2019-07-18 NOTE — Plan of Care (Signed)
  Problem: Clinical Measurements: Goal: Will remain free from infection Outcome: Progressing Note: Pt has shown no new signs of infection during my care.    Problem: Pain Managment: Goal: General experience of comfort will improve Outcome: Progressing Note: Pt complaining of neck and back pain but refuses medication at this time.    Problem: Safety: Goal: Ability to remain free from injury will improve Outcome: Progressing Note: Bed alarm activated and pt reminded to call for assistance with adls during my care.    Problem: Coping: Goal: Level of anxiety will decrease Outcome: Not Progressing Note: Pt has become confused during a previous shift. Pt has positive delerium screen and MD made aware. Pt is easily redirectable, but has just pulled off his tele and refuses to put it back on. MD aware. Will re-attempt at a later time.

## 2019-07-19 ENCOUNTER — Telehealth: Payer: Self-pay | Admitting: Nephrology

## 2019-07-19 NOTE — Telephone Encounter (Signed)
Transition of Care Contact from [place]   Date of Discharge: 07/18/2019 Date of Contact: 07/19/2019 Method of contact: phone Talked to patient daughter   Patient contacted to discuss transition of care form recent hospitaliztion. Patient was admitted to [facility] from 1/17 to 07/18/19 with the discharge diagnosis of acute on chronic respiratory failure 2/2 pulmonary edema.     No medications changes made due to patient signing out AMA.  Patient will follow up with is outpatient dialysis center 07/20/19.   Other follow up needs include: appointment with [facility] [place] surgery for OP work up of hernia repair.    Jen Mow, PA-C [facility] Pager: [phone]

## 2019-07-20 ENCOUNTER — Telehealth: Payer: Self-pay | Admitting: Family Medicine

## 2019-07-20 DIAGNOSIS — Z992 Dependence on renal dialysis: Secondary | ICD-10-CM | POA: Diagnosis not present

## 2019-07-20 DIAGNOSIS — N2581 Secondary hyperparathyroidism of renal origin: Secondary | ICD-10-CM | POA: Diagnosis not present

## 2019-07-20 DIAGNOSIS — N186 End stage renal disease: Secondary | ICD-10-CM | POA: Diagnosis not present

## 2019-07-20 NOTE — Telephone Encounter (Signed)
Called and left message for patient to follow up on his recent hospital discharge from 07/18/2019.

## 2019-07-20 NOTE — Telephone Encounter (Signed)
Spoke with patient's daughter and she informed me that patient is doing better. States he had a ruff day on yesterday as he was having some issues with confusion. States after going to dialysis today he seems to be doing much better and feels like he is breathing better. Patient has an appointment with Dr. Dennard Schaumann tomorrow. Advised to keep that apppointment.

## 2019-07-21 ENCOUNTER — Encounter: Payer: Self-pay | Admitting: Family Medicine

## 2019-07-21 ENCOUNTER — Ambulatory Visit (INDEPENDENT_AMBULATORY_CARE_PROVIDER_SITE_OTHER): Payer: Medicare HMO | Admitting: Family Medicine

## 2019-07-21 ENCOUNTER — Other Ambulatory Visit: Payer: Self-pay

## 2019-07-21 VITALS — BP 164/90 | HR 90 | Temp 96.7°F | Ht 70.0 in | Wt 155.0 lb

## 2019-07-21 DIAGNOSIS — R1031 Right lower quadrant pain: Secondary | ICD-10-CM

## 2019-07-21 DIAGNOSIS — I4891 Unspecified atrial fibrillation: Secondary | ICD-10-CM | POA: Diagnosis not present

## 2019-07-21 DIAGNOSIS — K4090 Unilateral inguinal hernia, without obstruction or gangrene, not specified as recurrent: Secondary | ICD-10-CM | POA: Diagnosis not present

## 2019-07-21 MED ORDER — PREDNISONE 20 MG PO TABS: 20.0000 mg | ORAL_TABLET | Freq: Every day | ORAL | 0 refills | Status: DC
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
## 2019-07-22 DIAGNOSIS — Z992 Dependence on renal dialysis: Secondary | ICD-10-CM | POA: Diagnosis not present

## 2019-07-22 DIAGNOSIS — N2581 Secondary hyperparathyroidism of renal origin: Secondary | ICD-10-CM | POA: Diagnosis not present

## 2019-07-22 DIAGNOSIS — N186 End stage renal disease: Secondary | ICD-10-CM | POA: Diagnosis not present

## 2019-07-25 DIAGNOSIS — N2581 Secondary hyperparathyroidism of renal origin: Secondary | ICD-10-CM | POA: Diagnosis not present

## 2019-07-25 DIAGNOSIS — Z992 Dependence on renal dialysis: Secondary | ICD-10-CM | POA: Diagnosis not present

## 2019-07-25 DIAGNOSIS — N186 End stage renal disease: Secondary | ICD-10-CM | POA: Diagnosis not present

## 2019-07-26 NOTE — Discharge Summary (Signed)
Physician Discharge Summary  [name]. [id number] DOB: 1946-01-16 DOA: 07/16/2019  PCP: [name], MD  Admit date: 07/16/2019 Discharge date: 07/26/2019  Disposition:  SIGNED OUT AMA ON 07/18/2019  Brief/Interim Summary: [name] Suarezis a 74 y.o.malewith medical history significant ofPAD; HTN; HLD; L eye enucleation; DM; CAD; COPD; ESRD on TTS HD; bladder cancer (2009); and afib presenting with SOB. He has had RLQ pain for which he had a negative CT scan. He was seen by his PCP on 12/11 for this issue and the thought was that this was most likely associated with scar tissue and adhesions associated with his prior bifemoral bypass graft. He was referred to vascular surgery and was seen on 1/8. At that visit, he had a large, raised, hard 8x8 cm mass in the R groin that was very TTP. They were concerned about this very associated with a right inguinal hernia and possibly a strangulated hernia and he was encouraged to go to the ER. CTA was performed and negative other than 50% luminal narrowing of the abdominal aorta that vascular surgery did not think this was contributing to his pain. He followed back up with his PCP on 1/15 and the thought was that since he would be such a poor surgical candidate that he should proceed with symptomatic management. The patient had tried prednisone for a separate issue with apparent improvement and so was started on this. The plan was to transition to fentanyl if the prednisone was unsuccessful, and he was also going to be referred to surgery.  He describes the RLQ pain as a stabbing pain that starts in the inguinal canal and radiates across to the suprapubic region. He did attend his full session of HD yesterday. He took the 2 doses of prednisone so far and this AM awoke overnight with severe SOB. He wonders if the prednisone caused this episode to happen. He is currently on BIPAP and feeling much better. He was admitted due to  respiratory failure secondary to volume overload as well as right inguinal hernia.  He underwent HD with improvement in respiratory status. He was also evaluated by general surgery and recommended for outpatient follow up for inguinal hernia. Patient signed out [facility] 1/21.    Discharge Diagnoses:  Principal Problem:   Acute on chronic respiratory failure with hypoxia ([name]) Active Problems:   Hyperlipidemia   TOBACCO ABUSE   Essential hypertension   Chronic back pain   COPD (chronic obstructive pulmonary disease) ([name])   Type II diabetes mellitus with renal manifestations ([name])   ESRD on dialysis [facility])   Atrial fibrillation ([name])   Volume overload   Reducible right inguinal hernia    Discharge Instructions   Allergies as of 07/18/2019      Reactions   Gelatin Other (See Comments)   ALPHA-GAL DANGER   Meat [alpha-gal] Other (See Comments)   REACTION TO HOOVED ANIMALS PARTICULARLY RED MEAT   Pork-derived Products Other (See Comments)   ALPHA-GAL DANGER   Shellfish Allergy Shortness Of Breath   Chicken Allergy Nausea And Vomiting   Ramipril Swelling   Tongue and throat swelling   Betaine Itching   Dextromethorphan-guaifenesin Swelling, Nausea And Vomiting   Other Other (See Comments)   Codeine Nausea And Vomiting   Morphine Itching     Allergies  Allergen Reactions  . Gelatin Other (See Comments)    ALPHA-GAL DANGER  . Meat [Alpha-Gal] Other (See Comments)    REACTION TO HOOVED ANIMALS PARTICULARLY RED MEAT  . Pork-Derived  Products Other (See Comments)    ALPHA-GAL DANGER  . Shellfish Allergy Shortness Of Breath  . Chicken Allergy Nausea And Vomiting  . Ramipril Swelling    Tongue and throat swelling  . Betaine Itching  . Dextromethorphan-Guaifenesin Swelling and Nausea And Vomiting  . Other Other (See Comments)  . Codeine Nausea And Vomiting  . Morphine Itching     Procedures/Studies: US PELVIS LIMITED (TRANSABDOMINAL ONLY)  Result Date:  07/16/2019 CLINICAL DATA:  Right groin pain with adenopathy the noted on recent CT. EXAM: LIMITED ULTRASOUND OF PELVIS TECHNIQUE: Limited transabdominal ultrasound examination of the pelvis was performed. COMPARISON:  July 07, 2019 FINDINGS: There are several prominent right inguinal lymph nodes. For example there is a 1.8 x 0.6 x 1 cm lymph node in addition to a 2.2 x 1 x 1.3 cm lymph node. These lymph nodes appear to demonstrate a normal fatty hilum and normal morphology. There is a bowel containing right inguinal hernia. IMPRESSION: 1. Bowel containing right inguinal hernia. 2. There are mildly enlarged but morphologically normal right inguinal lymph nodes. Electronically Signed   By: Constance Holster M.D.   On: 07/16/2019 19:17   DG Chest Port 1 View  Result Date: 07/16/2019 CLINICAL DATA:  Sudden onset short of breath.  Dialysis patient. EXAM: PORTABLE CHEST 1 VIEW COMPARISON:  Radiograph 11/04/2018 FINDINGS: Normal mediastinum and cardiac silhouette. There is diffuse lower lobe airspace disease. Low lung volumes. No pneumothorax. IMPRESSION: Lower lobe pulmonary edema versus multifocal pneumonia. Electronically Signed   By: Suzy Bouchard M.D.   On: 07/16/2019 07:47   CT Angio Abd/Pel w/ and/or w/o  Result Date: 07/07/2019 CLINICAL DATA:  74 year old male with abdominal pain. Evaluate for bowel obstruction or vascular obstruction. EXAM: CTA ABDOMEN AND PELVIS WITHOUT AND WITH CONTRAST TECHNIQUE: Multidetector CT imaging of the abdomen and pelvis was performed using the standard protocol during bolus administration of intravenous contrast. Multiplanar reconstructed images and MIPs were obtained and reviewed to evaluate the vascular anatomy. CONTRAST:  154mL OMNIPAQUE IOHEXOL 350 MG/ML SOLN COMPARISON:  CT of the abdomen pelvis dated 03/17/2019. FINDINGS: Evaluation is limited due to streak artifact caused by patient's arms. VASCULAR Aorta: There is advanced calcified and noncalcified plaque of the  abdominal aorta. There is approximately 50% luminal narrowing of the abdominal aorta at the level of the renal arteries due to plaque. There has been significant interval progression of the plaque and narrowing of the abdominal aorta at the level of the renal arteries since the study of 03/17/2019. There is complete occlusion of the infrarenal abdominal aorta similar to prior CT. Celiac: There is focal narrowing of the origin of the celiac axis with mild poststenotic dilatation measuring up to 8 mm similar to prior CT. The celiac axis and its major branches remain patent. SMA: The SMA is patent. There is enlargement of the jejunal branch arising from the SMA with complication with a more distal branch, possibly with the IMA. This is similar to prior CT. Renals: There is high-grade occlusion of the origin of the right renal artery by the intraluminal aortic thrombus. The renal arteries however remain patent. IMA: High-grade narrowing of the origin of the IMA. There is lower density contrast within the IMA, likely from communicating jejunal branch arising from the SMA. This is similar to prior CT. Inflow: The left iliac arteries are completely occluded. The right common iliac artery is occluded. There is reconstitution of the flow in the right internal and external iliac arteries similar to prior CT. Proximal  Outflow: A femoral femoral bypass graft is noted and appears patent. There is flow within the visualized femoral arteries. A bypass graft extends along the left chest and left abdominal wall, presumably and axillary femoral bypass and appears patent. Veins: The IVC is grossly unremarkable. No portal venous gas. The SMV, splenic vein, and main portal vein are patent. Review of the MIP images confirms the above findings. NON-VASCULAR Lower chest: Partially visualized small bilateral pleural effusions with bibasilar subsegmental atelectasis. Pneumonia is not excluded. Clinical correlation is recommended. There is  background of emphysema and subpleural interstitial coarsening and reticulation. A 13 mm nodular density in the right lower lobe (series 7, image 17) is new since the prior CT and may represent an infectious process. Attention on follow-up imaging recommended to document resolution. There is no intra-abdominal free air or free fluid. Hepatobiliary: Calcified linear density in the dome of the liver similar to prior CT. No intrahepatic biliary ductal dilatation. The gallbladder is unremarkable. Pancreas: Unremarkable. No pancreatic ductal dilatation or surrounding inflammatory changes. Spleen: Normal in size without focal abnormality. Adrenals/Urinary Tract: The adrenal glands are unremarkable. There is no hydronephrosis on either side. There is symmetric enhancement of the kidneys. Several small bilateral renal hypodense lesions are not well characterized. The visualized ureters appear unremarkable. The urinary bladder is collapsed. Stomach/Bowel: There is moderate amount of stool throughout the colon. There is scattered colonic diverticulosis without active inflammatory changes. There is no bowel obstruction or active inflammation. The appendix is not visualized with certainty. No inflammatory changes identified in the right lower quadrant. Lymphatic: No adenopathy. Reproductive: The prostate and seminal vesicles are grossly unremarkable. Other: None Musculoskeletal: Osteopenia with severe degenerative changes of the spine. No acute osseous pathology. Bilateral L5 pars defects with grade 2 L5-S1 anterolisthesis. IMPRESSION: 1. Interval development of intraluminal plaque within the abdominal aorta at the level of the renal arteries with approximately 50% luminal narrowing, new since the prior CT. 2. Complete occlusion of the infrarenal abdominal aorta similar to prior CT. The remainder of the vascular structures appears similar to prior CT. 3. Femoral femoral bypass graft as well as a bypass graft along the left  abdominal wall appear patent. 4. Small bilateral pleural effusions with bibasilar subsegmental atelectasis versus infiltrate. A 13 mm nodular density at the right lung base, new since the prior CT and possibly infectious in etiology. Follow-up to resolution recommended. 5. Colonic diverticulosis. No bowel obstruction or active inflammation. 6. Emphysema (ICD10-J43.9). Electronically Signed   By: Anner Crete M.D.   On: 07/07/2019 21:06      Discharge Exam: Vitals:   07/18/19 1036 07/18/19 2025  BP: 131/72 140/73  Pulse: 86 82  Resp: 19 19  Temp: 97.8 F (36.6 C) (!) 97.5 F (36.4 C)  SpO2: 95% 95%     The results of significant diagnostics from this hospitalization (including imaging, microbiology, ancillary and laboratory) are listed below for reference.     Microbiology: No results found for this or any previous visit (from the past 240 hour(s)).   Labs: BNP (last 3 results) Recent Labs    10/26/18 1641 07/16/19 0700  BNP 859.8* 3,419.6*   Basic Metabolic Panel: No results for input(s): NA, K, CL, CO2, GLUCOSE, BUN, CREATININE, CALCIUM, MG, PHOS in the last 168 hours. Liver Function Tests: No results for input(s): AST, ALT, ALKPHOS, BILITOT, PROT, ALBUMIN in the last 168 hours. No results for input(s): LIPASE, AMYLASE in the last 168 hours. No results for input(s): AMMONIA in the last 168  hours. CBC: No results for input(s): WBC, NEUTROABS, HGB, HCT, MCV, PLT in the last 168 hours. Cardiac Enzymes: No results for input(s): CKTOTAL, CKMB, CKMBINDEX, TROPONINI in the last 168 hours. BNP: Invalid input(s): POCBNP CBG: No results for input(s): GLUCAP in the last 168 hours. D-Dimer No results for input(s): DDIMER in the last 72 hours. Hgb A1c No results for input(s): HGBA1C in the last 72 hours. Lipid Profile No results for input(s): CHOL, HDL, LDLCALC, TRIG, CHOLHDL, LDLDIRECT in the last 72 hours. Thyroid function studies No results for input(s): TSH, T4TOTAL,  T3FREE, THYROIDAB in the last 72 hours.  Invalid input(s): FREET3 Anemia work up No results for input(s): VITAMINB12, FOLATE, FERRITIN, TIBC, IRON, RETICCTPCT in the last 72 hours. Urinalysis    Component Value Date/Time   COLORURINE YELLOW 11/05/2018 0601   APPEARANCEUR CLEAR 11/05/2018 0601   LABSPEC 1.012 11/05/2018 0601   PHURINE 7.0 11/05/2018 0601   GLUCOSEU NEGATIVE 11/05/2018 0601   HGBUR LARGE (A) 11/05/2018 0601   BILIRUBINUR NEGATIVE 11/05/2018 0601   KETONESUR NEGATIVE 11/05/2018 0601   PROTEINUR 100 (A) 11/05/2018 0601   NITRITE NEGATIVE 11/05/2018 0601   LEUKOCYTESUR NEGATIVE 11/05/2018 0601   Sepsis Labs Invalid input(s): PROCALCITONIN,  WBC,  LACTICIDVEN Microbiology No results found for this or any previous visit (from the past 240 hour(s)).   SIGNED:  Dessa Phi, DO [facility] 07/26/2019, 7:44 AM

## 2019-07-27 DIAGNOSIS — Z992 Dependence on renal dialysis: Secondary | ICD-10-CM | POA: Diagnosis not present

## 2019-07-27 DIAGNOSIS — N186 End stage renal disease: Secondary | ICD-10-CM | POA: Diagnosis not present

## 2019-07-27 DIAGNOSIS — N2581 Secondary hyperparathyroidism of renal origin: Secondary | ICD-10-CM | POA: Diagnosis not present

## 2019-07-29 DIAGNOSIS — Z992 Dependence on renal dialysis: Secondary | ICD-10-CM | POA: Diagnosis not present

## 2019-07-29 DIAGNOSIS — N186 End stage renal disease: Secondary | ICD-10-CM | POA: Diagnosis not present

## 2019-07-29 DIAGNOSIS — N2581 Secondary hyperparathyroidism of renal origin: Secondary | ICD-10-CM | POA: Diagnosis not present

## 2019-07-30 DIAGNOSIS — Z992 Dependence on renal dialysis: Secondary | ICD-10-CM | POA: Diagnosis not present

## 2019-07-30 DIAGNOSIS — E1122 Type 2 diabetes mellitus with diabetic chronic kidney disease: Secondary | ICD-10-CM | POA: Diagnosis not present

## 2019-07-30 DIAGNOSIS — N186 End stage renal disease: Secondary | ICD-10-CM | POA: Diagnosis not present

## 2019-08-01 DIAGNOSIS — Z992 Dependence on renal dialysis: Secondary | ICD-10-CM | POA: Diagnosis not present

## 2019-08-01 DIAGNOSIS — N186 End stage renal disease: Secondary | ICD-10-CM | POA: Diagnosis not present

## 2019-08-01 DIAGNOSIS — N2581 Secondary hyperparathyroidism of renal origin: Secondary | ICD-10-CM | POA: Diagnosis not present

## 2019-08-03 DIAGNOSIS — N186 End stage renal disease: Secondary | ICD-10-CM | POA: Diagnosis not present

## 2019-08-03 DIAGNOSIS — N2581 Secondary hyperparathyroidism of renal origin: Secondary | ICD-10-CM | POA: Diagnosis not present

## 2019-08-03 DIAGNOSIS — Z992 Dependence on renal dialysis: Secondary | ICD-10-CM | POA: Diagnosis not present

## 2019-08-05 DIAGNOSIS — N186 End stage renal disease: Secondary | ICD-10-CM | POA: Diagnosis not present

## 2019-08-05 DIAGNOSIS — Z992 Dependence on renal dialysis: Secondary | ICD-10-CM | POA: Diagnosis not present

## 2019-08-05 DIAGNOSIS — N2581 Secondary hyperparathyroidism of renal origin: Secondary | ICD-10-CM | POA: Diagnosis not present

## 2019-08-06 DIAGNOSIS — N186 End stage renal disease: Secondary | ICD-10-CM | POA: Diagnosis not present

## 2019-08-08 DIAGNOSIS — Z992 Dependence on renal dialysis: Secondary | ICD-10-CM | POA: Diagnosis not present

## 2019-08-08 DIAGNOSIS — N2581 Secondary hyperparathyroidism of renal origin: Secondary | ICD-10-CM | POA: Diagnosis not present

## 2019-08-08 DIAGNOSIS — N186 End stage renal disease: Secondary | ICD-10-CM | POA: Diagnosis not present

## 2019-08-10 DIAGNOSIS — N2581 Secondary hyperparathyroidism of renal origin: Secondary | ICD-10-CM | POA: Diagnosis not present

## 2019-08-10 DIAGNOSIS — N186 End stage renal disease: Secondary | ICD-10-CM | POA: Diagnosis not present

## 2019-08-10 DIAGNOSIS — Z992 Dependence on renal dialysis: Secondary | ICD-10-CM | POA: Diagnosis not present

## 2019-08-11 ENCOUNTER — Other Ambulatory Visit (HOSPITAL_COMMUNITY): Payer: Self-pay | Admitting: Nephrology

## 2019-08-11 DIAGNOSIS — N186 End stage renal disease: Secondary | ICD-10-CM

## 2019-08-11 DIAGNOSIS — Z992 Dependence on renal dialysis: Secondary | ICD-10-CM

## 2019-08-12 DIAGNOSIS — J449 Chronic obstructive pulmonary disease, unspecified: Secondary | ICD-10-CM | POA: Diagnosis not present

## 2019-08-12 DIAGNOSIS — M1990 Unspecified osteoarthritis, unspecified site: Secondary | ICD-10-CM | POA: Diagnosis not present

## 2019-08-12 DIAGNOSIS — I739 Peripheral vascular disease, unspecified: Secondary | ICD-10-CM | POA: Diagnosis not present

## 2019-08-12 DIAGNOSIS — N186 End stage renal disease: Secondary | ICD-10-CM | POA: Diagnosis not present

## 2019-08-13 ENCOUNTER — Other Ambulatory Visit: Payer: Self-pay | Admitting: Family Medicine

## 2019-08-13 DIAGNOSIS — N186 End stage renal disease: Secondary | ICD-10-CM | POA: Diagnosis not present

## 2019-08-13 DIAGNOSIS — N2581 Secondary hyperparathyroidism of renal origin: Secondary | ICD-10-CM | POA: Diagnosis not present

## 2019-08-13 DIAGNOSIS — Z992 Dependence on renal dialysis: Secondary | ICD-10-CM | POA: Diagnosis not present

## 2019-08-14 ENCOUNTER — Other Ambulatory Visit: Payer: Self-pay | Admitting: Radiology

## 2019-08-15 DIAGNOSIS — N2581 Secondary hyperparathyroidism of renal origin: Secondary | ICD-10-CM | POA: Diagnosis not present

## 2019-08-15 DIAGNOSIS — N186 End stage renal disease: Secondary | ICD-10-CM | POA: Diagnosis not present

## 2019-08-15 DIAGNOSIS — Z992 Dependence on renal dialysis: Secondary | ICD-10-CM | POA: Diagnosis not present

## 2019-08-16 ENCOUNTER — Ambulatory Visit (HOSPITAL_COMMUNITY): Payer: Medicare HMO

## 2019-08-16 ENCOUNTER — Encounter (HOSPITAL_COMMUNITY): Payer: Self-pay

## 2019-08-16 DIAGNOSIS — K4030 Unilateral inguinal hernia, with obstruction, without gangrene, not specified as recurrent: Secondary | ICD-10-CM | POA: Diagnosis not present

## 2019-08-17 DIAGNOSIS — Z992 Dependence on renal dialysis: Secondary | ICD-10-CM | POA: Diagnosis not present

## 2019-08-17 DIAGNOSIS — N2581 Secondary hyperparathyroidism of renal origin: Secondary | ICD-10-CM | POA: Diagnosis not present

## 2019-08-17 DIAGNOSIS — N186 End stage renal disease: Secondary | ICD-10-CM | POA: Diagnosis not present

## 2019-08-18 ENCOUNTER — Other Ambulatory Visit: Payer: Self-pay | Admitting: Family Medicine

## 2019-08-18 MED ORDER — OXYCODONE-ACETAMINOPHEN 10-325 MG PO TABS: 1.0000 | ORAL_TABLET | ORAL | 0 refills | Status: DC | PRN

## 2019-08-18 NOTE — Telephone Encounter (Signed)
Patient requesting a refill on Oxycodone     LOV:  07/21/2019  LRF:   07/17/19

## 2019-08-19 DIAGNOSIS — Z992 Dependence on renal dialysis: Secondary | ICD-10-CM | POA: Diagnosis not present

## 2019-08-19 DIAGNOSIS — N2581 Secondary hyperparathyroidism of renal origin: Secondary | ICD-10-CM | POA: Diagnosis not present

## 2019-08-19 DIAGNOSIS — N186 End stage renal disease: Secondary | ICD-10-CM | POA: Diagnosis not present

## 2019-08-22 DIAGNOSIS — N2581 Secondary hyperparathyroidism of renal origin: Secondary | ICD-10-CM | POA: Diagnosis not present

## 2019-08-22 DIAGNOSIS — Z992 Dependence on renal dialysis: Secondary | ICD-10-CM | POA: Diagnosis not present

## 2019-08-22 DIAGNOSIS — N186 End stage renal disease: Secondary | ICD-10-CM | POA: Diagnosis not present

## 2019-08-23 ENCOUNTER — Other Ambulatory Visit: Payer: Self-pay | Admitting: Family Medicine

## 2019-08-23 NOTE — Telephone Encounter (Signed)
Ok to refill 
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
## 2019-08-24 DIAGNOSIS — N2581 Secondary hyperparathyroidism of renal origin: Secondary | ICD-10-CM | POA: Diagnosis not present

## 2019-08-24 DIAGNOSIS — Z992 Dependence on renal dialysis: Secondary | ICD-10-CM | POA: Diagnosis not present

## 2019-08-24 DIAGNOSIS — N186 End stage renal disease: Secondary | ICD-10-CM | POA: Diagnosis not present

## 2019-08-25 ENCOUNTER — Ambulatory Visit: Payer: Medicare HMO | Admitting: Internal Medicine

## 2019-08-25 ENCOUNTER — Telehealth: Payer: Self-pay | Admitting: Internal Medicine

## 2019-08-25 NOTE — Telephone Encounter (Signed)
  Disregard request, [name] cb and informed pt couldn't make to the appt today. appt was cancelled

## 2019-08-25 NOTE — Telephone Encounter (Signed)
° °  Pt's daughter called requesting if she can come in with the pt's appointment today 08/25/19. She said pt's is in a wheelchair and would like to be with him.  Please advise

## 2019-08-25 NOTE — Telephone Encounter (Signed)
I spoke to the patient's daughter, she will call back to reschedule.  He is not feeling well and she going to check on him.   She can accompany him to his future appointment.

## 2019-08-26 DIAGNOSIS — Z992 Dependence on renal dialysis: Secondary | ICD-10-CM | POA: Diagnosis not present

## 2019-08-26 DIAGNOSIS — N2581 Secondary hyperparathyroidism of renal origin: Secondary | ICD-10-CM | POA: Diagnosis not present

## 2019-08-26 DIAGNOSIS — N186 End stage renal disease: Secondary | ICD-10-CM | POA: Diagnosis not present

## 2019-08-27 DIAGNOSIS — E1122 Type 2 diabetes mellitus with diabetic chronic kidney disease: Secondary | ICD-10-CM | POA: Diagnosis not present

## 2019-08-27 DIAGNOSIS — N186 End stage renal disease: Secondary | ICD-10-CM | POA: Diagnosis not present

## 2019-08-27 DIAGNOSIS — Z992 Dependence on renal dialysis: Secondary | ICD-10-CM | POA: Diagnosis not present

## 2019-08-29 DIAGNOSIS — Z992 Dependence on renal dialysis: Secondary | ICD-10-CM | POA: Diagnosis not present

## 2019-08-29 DIAGNOSIS — N2581 Secondary hyperparathyroidism of renal origin: Secondary | ICD-10-CM | POA: Diagnosis not present

## 2019-08-29 DIAGNOSIS — N186 End stage renal disease: Secondary | ICD-10-CM | POA: Diagnosis not present

## 2019-08-30 DIAGNOSIS — Z992 Dependence on renal dialysis: Secondary | ICD-10-CM | POA: Diagnosis not present

## 2019-08-30 DIAGNOSIS — K4030 Unilateral inguinal hernia, with obstruction, without gangrene, not specified as recurrent: Secondary | ICD-10-CM | POA: Diagnosis not present

## 2019-08-30 DIAGNOSIS — I871 Compression of vein: Secondary | ICD-10-CM | POA: Diagnosis not present

## 2019-08-30 DIAGNOSIS — N186 End stage renal disease: Secondary | ICD-10-CM | POA: Diagnosis not present

## 2019-08-30 DIAGNOSIS — J449 Chronic obstructive pulmonary disease, unspecified: Secondary | ICD-10-CM | POA: Diagnosis not present

## 2019-08-31 DIAGNOSIS — Z992 Dependence on renal dialysis: Secondary | ICD-10-CM | POA: Diagnosis not present

## 2019-08-31 DIAGNOSIS — N186 End stage renal disease: Secondary | ICD-10-CM | POA: Diagnosis not present

## 2019-08-31 DIAGNOSIS — N2581 Secondary hyperparathyroidism of renal origin: Secondary | ICD-10-CM | POA: Diagnosis not present

## 2019-09-01 ENCOUNTER — Telehealth: Payer: Self-pay

## 2019-09-01 ENCOUNTER — Inpatient Hospital Stay (HOSPITAL_COMMUNITY): Payer: Medicare HMO

## 2019-09-01 ENCOUNTER — Emergency Department (HOSPITAL_COMMUNITY): Payer: Medicare HMO

## 2019-09-01 ENCOUNTER — Encounter (HOSPITAL_COMMUNITY): Payer: Self-pay

## 2019-09-01 ENCOUNTER — Other Ambulatory Visit: Payer: Self-pay

## 2019-09-01 ENCOUNTER — Inpatient Hospital Stay (HOSPITAL_COMMUNITY)
Admission: EM | Admit: 2019-09-01 | Discharge: 2019-09-07 | DRG: 246 | Disposition: A | Discharge status: Home Health | Payer: Medicare HMO | Attending: Internal Medicine | Admitting: Internal Medicine

## 2019-09-01 DIAGNOSIS — J449 Chronic obstructive pulmonary disease, unspecified: Secondary | ICD-10-CM | POA: Diagnosis not present

## 2019-09-01 DIAGNOSIS — Z8249 Family history of ischemic heart disease and other diseases of the circulatory system: Secondary | ICD-10-CM

## 2019-09-01 DIAGNOSIS — I739 Peripheral vascular disease, unspecified: Secondary | ICD-10-CM | POA: Diagnosis present

## 2019-09-01 DIAGNOSIS — D631 Anemia in chronic kidney disease: Secondary | ICD-10-CM | POA: Diagnosis present

## 2019-09-01 DIAGNOSIS — N186 End stage renal disease: Secondary | ICD-10-CM | POA: Diagnosis present

## 2019-09-01 DIAGNOSIS — E785 Hyperlipidemia, unspecified: Secondary | ICD-10-CM | POA: Diagnosis present

## 2019-09-01 DIAGNOSIS — E8889 Other specified metabolic disorders: Secondary | ICD-10-CM | POA: Diagnosis present

## 2019-09-01 DIAGNOSIS — I4891 Unspecified atrial fibrillation: Secondary | ICD-10-CM | POA: Diagnosis present

## 2019-09-01 DIAGNOSIS — Z86718 Personal history of other venous thrombosis and embolism: Secondary | ICD-10-CM

## 2019-09-01 DIAGNOSIS — E875 Hyperkalemia: Secondary | ICD-10-CM | POA: Diagnosis not present

## 2019-09-01 DIAGNOSIS — R0602 Shortness of breath: Secondary | ICD-10-CM | POA: Diagnosis not present

## 2019-09-01 DIAGNOSIS — N250 Renal osteodystrophy: Secondary | ICD-10-CM | POA: Diagnosis not present

## 2019-09-01 DIAGNOSIS — E1136 Type 2 diabetes mellitus with diabetic cataract: Secondary | ICD-10-CM | POA: Diagnosis present

## 2019-09-01 DIAGNOSIS — I953 Hypotension of hemodialysis: Secondary | ICD-10-CM | POA: Diagnosis not present

## 2019-09-01 DIAGNOSIS — Z808 Family history of malignant neoplasm of other organs or systems: Secondary | ICD-10-CM

## 2019-09-01 DIAGNOSIS — I214 Non-ST elevation (NSTEMI) myocardial infarction: Secondary | ICD-10-CM | POA: Diagnosis not present

## 2019-09-01 DIAGNOSIS — Z888 Allergy status to other drugs, medicaments and biological substances status: Secondary | ICD-10-CM

## 2019-09-01 DIAGNOSIS — F17210 Nicotine dependence, cigarettes, uncomplicated: Secondary | ICD-10-CM | POA: Diagnosis present

## 2019-09-01 DIAGNOSIS — Z91018 Allergy to other foods: Secondary | ICD-10-CM | POA: Diagnosis not present

## 2019-09-01 DIAGNOSIS — G8929 Other chronic pain: Secondary | ICD-10-CM | POA: Diagnosis present

## 2019-09-01 DIAGNOSIS — R911 Solitary pulmonary nodule: Secondary | ICD-10-CM | POA: Diagnosis present

## 2019-09-01 DIAGNOSIS — I480 Paroxysmal atrial fibrillation: Secondary | ICD-10-CM | POA: Diagnosis present

## 2019-09-01 DIAGNOSIS — I2510 Atherosclerotic heart disease of native coronary artery without angina pectoris: Secondary | ICD-10-CM | POA: Diagnosis present

## 2019-09-01 DIAGNOSIS — Z955 Presence of coronary angioplasty implant and graft: Secondary | ICD-10-CM | POA: Diagnosis not present

## 2019-09-01 DIAGNOSIS — Z79899 Other long term (current) drug therapy: Secondary | ICD-10-CM

## 2019-09-01 DIAGNOSIS — Z794 Long term (current) use of insulin: Secondary | ICD-10-CM | POA: Diagnosis not present

## 2019-09-01 DIAGNOSIS — I132 Hypertensive heart and chronic kidney disease with heart failure and with stage 5 chronic kidney disease, or end stage renal disease: Secondary | ICD-10-CM | POA: Diagnosis not present

## 2019-09-01 DIAGNOSIS — I340 Nonrheumatic mitral (valve) insufficiency: Secondary | ICD-10-CM | POA: Diagnosis not present

## 2019-09-01 DIAGNOSIS — I509 Heart failure, unspecified: Secondary | ICD-10-CM | POA: Diagnosis not present

## 2019-09-01 DIAGNOSIS — D649 Anemia, unspecified: Secondary | ICD-10-CM | POA: Diagnosis present

## 2019-09-01 DIAGNOSIS — Z992 Dependence on renal dialysis: Secondary | ICD-10-CM | POA: Diagnosis not present

## 2019-09-01 DIAGNOSIS — Z20822 Contact with and (suspected) exposure to covid-19: Secondary | ICD-10-CM | POA: Diagnosis present

## 2019-09-01 DIAGNOSIS — E1129 Type 2 diabetes mellitus with other diabetic kidney complication: Secondary | ICD-10-CM | POA: Diagnosis present

## 2019-09-01 DIAGNOSIS — E8770 Fluid overload, unspecified: Secondary | ICD-10-CM | POA: Diagnosis present

## 2019-09-01 DIAGNOSIS — R778 Other specified abnormalities of plasma proteins: Secondary | ICD-10-CM | POA: Diagnosis present

## 2019-09-01 DIAGNOSIS — E1151 Type 2 diabetes mellitus with diabetic peripheral angiopathy without gangrene: Secondary | ICD-10-CM | POA: Diagnosis present

## 2019-09-01 DIAGNOSIS — Z885 Allergy status to narcotic agent status: Secondary | ICD-10-CM | POA: Diagnosis not present

## 2019-09-01 DIAGNOSIS — M7989 Other specified soft tissue disorders: Secondary | ICD-10-CM | POA: Diagnosis not present

## 2019-09-01 DIAGNOSIS — I5032 Chronic diastolic (congestive) heart failure: Secondary | ICD-10-CM | POA: Diagnosis not present

## 2019-09-01 DIAGNOSIS — E1122 Type 2 diabetes mellitus with diabetic chronic kidney disease: Secondary | ICD-10-CM | POA: Diagnosis present

## 2019-09-01 DIAGNOSIS — I10 Essential (primary) hypertension: Secondary | ICD-10-CM | POA: Diagnosis not present

## 2019-09-01 DIAGNOSIS — J9621 Acute and chronic respiratory failure with hypoxia: Secondary | ICD-10-CM | POA: Diagnosis not present

## 2019-09-01 DIAGNOSIS — E11649 Type 2 diabetes mellitus with hypoglycemia without coma: Secondary | ICD-10-CM | POA: Diagnosis present

## 2019-09-01 DIAGNOSIS — I361 Nonrheumatic tricuspid (valve) insufficiency: Secondary | ICD-10-CM | POA: Diagnosis not present

## 2019-09-01 DIAGNOSIS — R0600 Dyspnea, unspecified: Secondary | ICD-10-CM | POA: Diagnosis not present

## 2019-09-01 DIAGNOSIS — Z91013 Allergy to seafood: Secondary | ICD-10-CM

## 2019-09-01 DIAGNOSIS — I25110 Atherosclerotic heart disease of native coronary artery with unstable angina pectoris: Secondary | ICD-10-CM | POA: Diagnosis not present

## 2019-09-01 DIAGNOSIS — J849 Interstitial pulmonary disease, unspecified: Secondary | ICD-10-CM | POA: Diagnosis not present

## 2019-09-01 DIAGNOSIS — I25119 Atherosclerotic heart disease of native coronary artery with unspecified angina pectoris: Principal | ICD-10-CM | POA: Diagnosis present

## 2019-09-01 DIAGNOSIS — J431 Panlobular emphysema: Secondary | ICD-10-CM | POA: Diagnosis not present

## 2019-09-01 DIAGNOSIS — Z9981 Dependence on supplemental oxygen: Secondary | ICD-10-CM

## 2019-09-01 DIAGNOSIS — Z7901 Long term (current) use of anticoagulants: Secondary | ICD-10-CM

## 2019-09-01 DIAGNOSIS — E1121 Type 2 diabetes mellitus with diabetic nephropathy: Secondary | ICD-10-CM | POA: Diagnosis not present

## 2019-09-01 DIAGNOSIS — Z7952 Long term (current) use of systemic steroids: Secondary | ICD-10-CM

## 2019-09-01 DIAGNOSIS — I2584 Coronary atherosclerosis due to calcified coronary lesion: Secondary | ICD-10-CM | POA: Diagnosis present

## 2019-09-01 DIAGNOSIS — Z833 Family history of diabetes mellitus: Secondary | ICD-10-CM

## 2019-09-01 HISTORY — DX: Chronic diastolic (congestive) heart failure: I50.32

## 2019-09-01 HISTORY — DX: End stage renal disease: N18.6

## 2019-09-01 HISTORY — DX: Disorder of arteries and arterioles, unspecified: I77.9

## 2019-09-01 HISTORY — DX: Solitary pulmonary nodule: R91.1

## 2019-09-01 HISTORY — DX: Personal history of other venous thrombosis and embolism: Z86.718

## 2019-09-01 HISTORY — DX: Malignant neoplasm of bladder, unspecified: C67.9

## 2019-09-01 HISTORY — DX: Other persistent atrial fibrillation: I48.19

## 2019-09-01 LAB — COMPREHENSIVE METABOLIC PANEL
ALT: 12 U/L (ref 0–44)
AST: 36 U/L (ref 15–41)
Albumin: 2.5 g/dL — ABNORMAL LOW (ref 3.5–5.0)
Alkaline Phosphatase: 60 U/L (ref 38–126)
Anion gap: 10 (ref 5–15)
BUN: 26 mg/dL — ABNORMAL HIGH (ref 8–23)
CO2: 30 mmol/L (ref 22–32)
Calcium: 8.2 mg/dL — ABNORMAL LOW (ref 8.9–10.3)
Chloride: 99 mmol/L (ref 98–111)
Creatinine, Ser: 4.97 mg/dL — ABNORMAL HIGH (ref 0.61–1.24)
GFR calc Af Amer: 12 mL/min — ABNORMAL LOW (ref >60)
GFR calc non Af Amer: 11 mL/min — ABNORMAL LOW (ref >60)
Glucose, Bld: 103 mg/dL — ABNORMAL HIGH (ref 70–99)
Potassium: 4.5 mmol/L (ref 3.5–5.1)
Sodium: 139 mmol/L (ref 135–145)
Total Bilirubin: 0.7 mg/dL (ref 0.3–1.2)
Total Protein: 6 g/dL — ABNORMAL LOW (ref 6.5–8.1)

## 2019-09-01 LAB — CBC WITH DIFFERENTIAL/PLATELET
Abs Immature Granulocytes: 0.03 10*3/uL (ref 0.00–0.07)
Basophils Absolute: 0 10*3/uL (ref 0.0–0.1)
Basophils Relative: 0 %
Eosinophils Absolute: 0.1 10*3/uL (ref 0.0–0.5)
Eosinophils Relative: 1 %
HCT: 22.3 % — ABNORMAL LOW (ref 39.0–52.0)
Hemoglobin: 6.9 g/dL — CL (ref 13.0–17.0)
Immature Granulocytes: 1 %
Lymphocytes Relative: 25 %
Lymphs Abs: 1.5 10*3/uL (ref 0.7–4.0)
MCH: 29.2 pg (ref 26.0–34.0)
MCHC: 30.9 g/dL (ref 30.0–36.0)
MCV: 94.5 fL (ref 80.0–100.0)
Monocytes Absolute: 0.4 10*3/uL (ref 0.1–1.0)
Monocytes Relative: 6 %
Neutro Abs: 4.1 10*3/uL (ref 1.7–7.7)
Neutrophils Relative %: 67 %
Platelets: 154 10*3/uL (ref 150–400)
RBC: 2.36 MIL/uL — ABNORMAL LOW (ref 4.22–5.81)
RDW: 18.4 % — ABNORMAL HIGH (ref 11.5–15.5)
WBC: 6.1 10*3/uL (ref 4.0–10.5)
nRBC: 0 % (ref 0.0–0.2)

## 2019-09-01 LAB — TROPONIN I (HIGH SENSITIVITY)
Troponin I (High Sensitivity): 2381 ng/L (ref <18)
Troponin I (High Sensitivity): 2709 ng/L (ref <18)

## 2019-09-01 LAB — POC OCCULT BLOOD, ED: Fecal Occult Bld: NEGATIVE

## 2019-09-01 LAB — IRON AND TIBC
Iron: 30 ug/dL — ABNORMAL LOW (ref 45–182)
Saturation Ratios: 13 % — ABNORMAL LOW (ref 17.9–39.5)
TIBC: 230 ug/dL — ABNORMAL LOW (ref 250–450)
UIBC: 200 ug/dL

## 2019-09-01 LAB — RESPIRATORY PANEL BY RT PCR (FLU A&B, COVID)
Influenza A by PCR: NEGATIVE
Influenza B by PCR: NEGATIVE
SARS Coronavirus 2 by RT PCR: NEGATIVE

## 2019-09-01 LAB — VITAMIN B12: Vitamin B-12: 297 pg/mL (ref 180–914)

## 2019-09-01 LAB — RETICULOCYTES
Immature Retic Fract: 32.7 % — ABNORMAL HIGH (ref 2.3–15.9)
RBC.: 2.38 MIL/uL — ABNORMAL LOW (ref 4.22–5.81)
Retic Count, Absolute: 67.1 10*3/uL (ref 19.0–186.0)
Retic Ct Pct: 2.8 % (ref 0.4–3.1)

## 2019-09-01 LAB — GLUCOSE, CAPILLARY
Glucose-Capillary: 69 mg/dL — ABNORMAL LOW (ref 70–99)
Glucose-Capillary: 72 mg/dL (ref 70–99)

## 2019-09-01 LAB — FERRITIN: Ferritin: 153 ng/mL (ref 24–336)

## 2019-09-01 LAB — FOLATE: Folate: 9.1 ng/mL (ref >5.9)

## 2019-09-01 MED ORDER — OXYCODONE-ACETAMINOPHEN 5-325 MG PO TABS
1.0000 | ORAL_TABLET | ORAL | Status: DC | PRN
  Administered 2019-09-05 – 2019-09-07 (×3): 1 via ORAL
  Filled 2019-09-01 (×3): qty 1

## 2019-09-01 MED ORDER — SODIUM CHLORIDE 0.9% FLUSH
3.0000 mL | Freq: Two times a day (BID) | INTRAVENOUS | Status: DC
  Administered 2019-09-02 – 2019-09-07 (×9): 3 mL via INTRAVENOUS

## 2019-09-01 MED ORDER — POLYETHYLENE GLYCOL 3350 17 G PO PACK
17.0000 g | PACK | Freq: Two times a day (BID) | ORAL | Status: DC | PRN
  Administered 2019-09-07: 17 g via ORAL
  Filled 2019-09-01: qty 1

## 2019-09-01 MED ORDER — RENA-VITE PO TABS
1.0000 | ORAL_TABLET | Freq: Every day | ORAL | Status: DC
  Administered 2019-09-02 – 2019-09-03 (×2): 1 via ORAL
  Filled 2019-09-01 (×2): qty 1

## 2019-09-01 MED ORDER — ONDANSETRON HCL 4 MG PO TABS
4.0000 mg | ORAL_TABLET | Freq: Three times a day (TID) | ORAL | Status: DC | PRN
  Administered 2019-09-03: 4 mg via ORAL
  Filled 2019-09-01: qty 1

## 2019-09-01 MED ORDER — ATORVASTATIN CALCIUM 80 MG PO TABS
80.0000 mg | ORAL_TABLET | Freq: Every day | ORAL | Status: DC
  Administered 2019-09-01 – 2019-09-06 (×6): 80 mg via ORAL
  Filled 2019-09-01 (×6): qty 1

## 2019-09-01 MED ORDER — TIZANIDINE HCL 2 MG PO TABS
2.0000 mg | ORAL_TABLET | Freq: Every day | ORAL | Status: DC
  Administered 2019-09-01 – 2019-09-06 (×6): 2 mg via ORAL
  Filled 2019-09-01 (×7): qty 1

## 2019-09-01 MED ORDER — ASPIRIN 81 MG PO CHEW
324.0000 mg | CHEWABLE_TABLET | Freq: Once | ORAL | Status: AC
  Administered 2019-09-01: 324 mg via ORAL
  Filled 2019-09-01: qty 4

## 2019-09-01 MED ORDER — ERYTHROMYCIN 5 MG/GM OP OINT: 1.0000 "application " | TOPICAL_OINTMENT | Freq: Every day | OPHTHALMIC | Status: DC | PRN

## 2019-09-01 MED ORDER — METOPROLOL SUCCINATE ER 25 MG PO TB24: 25.0000 mg | ORAL_TABLET | Freq: Every day | ORAL | Status: DC

## 2019-09-01 MED ORDER — EZETIMIBE 10 MG PO TABS
10.0000 mg | ORAL_TABLET | Freq: Every day | ORAL | Status: DC
  Administered 2019-09-02 – 2019-09-07 (×5): 10 mg via ORAL
  Filled 2019-09-01 (×6): qty 1

## 2019-09-01 MED ORDER — TRAZODONE HCL 50 MG PO TABS
50.0000 mg | ORAL_TABLET | Freq: Every evening | ORAL | Status: DC | PRN
  Administered 2019-09-02 – 2019-09-06 (×5): 50 mg via ORAL
  Filled 2019-09-01 (×5): qty 1

## 2019-09-01 MED ORDER — SODIUM CHLORIDE 0.9 % IV SOLN: 250.0000 mL | INTRAVENOUS | Status: DC | PRN

## 2019-09-01 MED ORDER — APIXABAN 5 MG PO TABS
5.0000 mg | ORAL_TABLET | Freq: Two times a day (BID) | ORAL | Status: DC
  Administered 2019-09-02: 5 mg via ORAL
  Filled 2019-09-01: qty 1

## 2019-09-01 MED ORDER — VITAMIN E 180 MG (400 UNIT) PO CAPS
400.0000 [IU] | ORAL_CAPSULE | Freq: Every day | ORAL | Status: DC
  Administered 2019-09-02 – 2019-09-07 (×5): 400 [IU] via ORAL
  Filled 2019-09-01 (×6): qty 1

## 2019-09-01 MED ORDER — SODIUM CHLORIDE 0.9% FLUSH: 3.0000 mL | INTRAVENOUS | Status: DC | PRN

## 2019-09-01 MED ORDER — OXYCODONE-ACETAMINOPHEN 10-325 MG PO TABS: 1.0000 | ORAL_TABLET | ORAL | Status: DC | PRN

## 2019-09-01 MED ORDER — GLUCOSE 40 % PO GEL
1.0000 | ORAL | Status: AC
  Administered 2019-09-01: 37.5 g via ORAL
  Filled 2019-09-01: qty 1

## 2019-09-01 MED ORDER — IOHEXOL 350 MG/ML SOLN
100.0000 mL | Freq: Once | INTRAVENOUS | Status: AC | PRN
  Administered 2019-09-01: 100 mL via INTRAVENOUS

## 2019-09-01 MED ORDER — METOPROLOL TARTRATE 12.5 MG HALF TABLET
12.5000 mg | ORAL_TABLET | Freq: Two times a day (BID) | ORAL | Status: DC
  Administered 2019-09-01 – 2019-09-04 (×4): 12.5 mg via ORAL
  Filled 2019-09-01 (×8): qty 1

## 2019-09-01 MED ORDER — ACETAMINOPHEN 325 MG PO TABS
650.0000 mg | ORAL_TABLET | Freq: Four times a day (QID) | ORAL | Status: DC | PRN
  Administered 2019-09-03: 650 mg via ORAL
  Filled 2019-09-01: qty 2

## 2019-09-01 MED ORDER — ACETAMINOPHEN 650 MG RE SUPP: 650.0000 mg | Freq: Four times a day (QID) | RECTAL | Status: DC | PRN

## 2019-09-01 MED ORDER — OXYCODONE HCL 5 MG PO TABS
5.0000 mg | ORAL_TABLET | ORAL | Status: DC | PRN
  Administered 2019-09-01 – 2019-09-07 (×8): 5 mg via ORAL
  Filled 2019-09-01 (×9): qty 1

## 2019-09-01 MED ORDER — INSULIN ASPART 100 UNIT/ML ~~LOC~~ SOLN: 0.0000 [IU] | Freq: Three times a day (TID) | SUBCUTANEOUS | Status: DC

## 2019-09-01 MED ORDER — INSULIN ASPART 100 UNIT/ML ~~LOC~~ SOLN: 0.0000 [IU] | Freq: Every day | SUBCUTANEOUS | Status: DC

## 2019-09-01 MED ORDER — NICOTINE 7 MG/24HR TD PT24
7.0000 mg | MEDICATED_PATCH | Freq: Every day | TRANSDERMAL | Status: DC
  Administered 2019-09-01 – 2019-09-05 (×5): 7 mg via TRANSDERMAL
  Filled 2019-09-01 (×7): qty 1
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
## 2019-09-01 NOTE — ED Notes (Signed)
Date and time results received: 09/01/19 1:24 PM  (use smartphrase ".now" to insert current time)  Test: hemoglobin Critical Value: 6.9  Name of Provider Notified: Aldona Bar PA  Orders Received? Or Actions Taken?:na

## 2019-09-01 NOTE — Telephone Encounter (Signed)
Pt called to report that he is having sob and it was bad on 03/04 pm and 03/05 am. Pt states he would like an order for O2. I advised pt to go to ED. Pt also states that it just leveled off and he would wait to go to ED. Please advise.

## 2019-09-01 NOTE — ED Notes (Signed)
Date and time results received: 09/01/19 1:54 PM   (use smartphrase ".now" to insert current time)  Test: troponin Critical Value: 2381  Name of Provider Notified: Aldona Bar PA Orders Received? Or Actions Taken?: na

## 2019-09-01 NOTE — ED Notes (Signed)
Abby, RN, [facility] made aware of request for sandwich by pt-will bring on way down to ED shortly

## 2019-09-01 NOTE — Telephone Encounter (Signed)
If patient is sob he needs to be seen.  We have no spots so urgent care or ed is recommended.

## 2019-09-01 NOTE — Progress Notes (Signed)
[facility] called patient on his way. No report given at this moment from ER RN.

## 2019-09-01 NOTE — ED Notes (Signed)
Pt repositioned in bed for comfort. Pt given ice water per request. Pt requesting food- will check MD orders. Pt updated on plan of care. Bed at [facility] is ready, [facility] notified for transport.

## 2019-09-01 NOTE — ED Triage Notes (Signed)
Pt has been having SOB for the last 2 days. Pt is on dialysis and had dialysis yesterday. Had surgery on his left arm yesterday due to a problem in his fistula. States he has a history of blood clots in his lungs. Has been SOB since surgery.
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
## 2019-09-01 NOTE — ED Notes (Signed)
All belongings including hernia brace placed in bag and sent with pt

## 2019-09-02 ENCOUNTER — Encounter (HOSPITAL_COMMUNITY): Payer: Self-pay | Admitting: Family Medicine

## 2019-09-02 ENCOUNTER — Inpatient Hospital Stay (HOSPITAL_COMMUNITY): Payer: Medicare HMO

## 2019-09-02 DIAGNOSIS — I361 Nonrheumatic tricuspid (valve) insufficiency: Secondary | ICD-10-CM

## 2019-09-02 DIAGNOSIS — Z794 Long term (current) use of insulin: Secondary | ICD-10-CM

## 2019-09-02 DIAGNOSIS — Z992 Dependence on renal dialysis: Secondary | ICD-10-CM

## 2019-09-02 DIAGNOSIS — I340 Nonrheumatic mitral (valve) insufficiency: Secondary | ICD-10-CM

## 2019-09-02 DIAGNOSIS — N186 End stage renal disease: Secondary | ICD-10-CM

## 2019-09-02 DIAGNOSIS — E1121 Type 2 diabetes mellitus with diabetic nephropathy: Secondary | ICD-10-CM

## 2019-09-02 DIAGNOSIS — R778 Other specified abnormalities of plasma proteins: Secondary | ICD-10-CM

## 2019-09-02 LAB — CBC
HCT: 22.6 % — ABNORMAL LOW (ref 39.0–52.0)
Hemoglobin: 7 g/dL — ABNORMAL LOW (ref 13.0–17.0)
MCH: 29 pg (ref 26.0–34.0)
MCHC: 31 g/dL (ref 30.0–36.0)
MCV: 93.8 fL (ref 80.0–100.0)
Platelets: 187 10*3/uL (ref 150–400)
RBC: 2.41 MIL/uL — ABNORMAL LOW (ref 4.22–5.81)
RDW: 18.7 % — ABNORMAL HIGH (ref 11.5–15.5)
WBC: 6.8 10*3/uL (ref 4.0–10.5)
nRBC: 0.3 % — ABNORMAL HIGH (ref 0.0–0.2)

## 2019-09-02 LAB — BASIC METABOLIC PANEL
Anion gap: 12 (ref 5–15)
BUN: 30 mg/dL — ABNORMAL HIGH (ref 8–23)
CO2: 27 mmol/L (ref 22–32)
Calcium: 8.1 mg/dL — ABNORMAL LOW (ref 8.9–10.3)
Chloride: 97 mmol/L — ABNORMAL LOW (ref 98–111)
Creatinine, Ser: 6.01 mg/dL — ABNORMAL HIGH (ref 0.61–1.24)
GFR calc Af Amer: 10 mL/min — ABNORMAL LOW (ref >60)
GFR calc non Af Amer: 8 mL/min — ABNORMAL LOW (ref >60)
Glucose, Bld: 92 mg/dL (ref 70–99)
Potassium: 4.7 mmol/L (ref 3.5–5.1)
Sodium: 136 mmol/L (ref 135–145)

## 2019-09-02 LAB — MRSA PCR SCREENING: MRSA by PCR: NEGATIVE

## 2019-09-02 LAB — PREPARE RBC (CROSSMATCH)

## 2019-09-02 LAB — GLUCOSE, CAPILLARY
Glucose-Capillary: 57 mg/dL — ABNORMAL LOW (ref 70–99)
Glucose-Capillary: 113 mg/dL — ABNORMAL HIGH (ref 70–99)
Glucose-Capillary: 94 mg/dL (ref 70–99)
Glucose-Capillary: 62 mg/dL — ABNORMAL LOW (ref 70–99)
Glucose-Capillary: 108 mg/dL — ABNORMAL HIGH (ref 70–99)
Glucose-Capillary: 110 mg/dL — ABNORMAL HIGH (ref 70–99)

## 2019-09-02 LAB — HEMOGLOBIN AND HEMATOCRIT, BLOOD
HCT: 27.6 % — ABNORMAL LOW (ref 39.0–52.0)
Hemoglobin: 8.9 g/dL — ABNORMAL LOW (ref 13.0–17.0)

## 2019-09-02 LAB — HEMOGLOBIN A1C
Hgb A1c MFr Bld: 4.4 % — ABNORMAL LOW (ref 4.8–5.6)
Mean Plasma Glucose: 79.58 mg/dL

## 2019-09-02 LAB — ECHOCARDIOGRAM COMPLETE
Height: 70.5 in
Weight: 2511.48 oz

## 2019-09-02 MED ORDER — MIDODRINE HCL 5 MG PO TABS: 15.0000 mg | ORAL_TABLET | Freq: Once | ORAL | Status: AC

## 2019-09-02 MED ORDER — SODIUM CHLORIDE 0.9% IV SOLUTION: Freq: Once | INTRAVENOUS | Status: DC

## 2019-09-02 MED ORDER — CHLORHEXIDINE GLUCONATE CLOTH 2 % EX PADS
6.0000 | MEDICATED_PAD | Freq: Every day | CUTANEOUS | Status: DC
  Administered 2019-09-02 – 2019-09-03 (×2): 6 via TOPICAL

## 2019-09-02 MED ORDER — MIDODRINE HCL 5 MG PO TABS
ORAL_TABLET | ORAL | Status: AC
  Administered 2019-09-02: 15 mg via ORAL
  Filled 2019-09-02: qty 3

## 2019-09-02 MED ORDER — ONDANSETRON HCL 4 MG/2ML IJ SOLN
4.0000 mg | Freq: Once | INTRAMUSCULAR | Status: AC
  Administered 2019-09-02: 4 mg via INTRAVENOUS
  Filled 2019-09-02: qty 2
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
## 2019-09-02 NOTE — Progress Notes (Signed)
New Admission Note: ? Arrival Method: Stretcher Mental Orientation: Alert and Oriented x 4 Telemetry: Box 15 Assessment: Completed Skin: Refer to flowsheet IV: No IV access Pain: 8/10 pain medication given Tubes: None Safety Measures: Safety Fall Prevention Plan discussed with patient. Admission: Completed 5 [place] Orientation: Patient has been orientated to the room, unit and the staff. Family: None Orders have been reviewed and are being implemented. Will continue to monitor the patient. Call light has been placed within reach and bed alarm has been activated.  ? Milagros Loll, RN  Phone Number: [phone]

## 2019-09-02 NOTE — Progress Notes (Signed)
  Echocardiogram 2D Echocardiogram has been performed.  [name] 09/02/2019, 9:05 AM

## 2019-09-02 NOTE — Plan of Care (Signed)
  Problem: Education: Goal: Knowledge of General Education information will improve Description: Including pain rating scale, medication(s)/side effects and non-pharmacologic comfort measures Outcome: Progressing   Problem: Pain Managment: Goal: General experience of comfort will improve Outcome: Progressing   Problem: Safety: Goal: Ability to remain free from injury will improve Outcome: Progressing   

## 2019-09-02 NOTE — Progress Notes (Signed)
PROGRESS NOTE  [name]. YTK:[id number] DOB: [birth year] DOA: 09/01/2019 PCP: [name], MD  HPI/Recap of past 24 hours: HPI done by [name]  is a 74 y.o. male with a history of ESRD on dialysisT/Th/Sat,HTN, CAD, COPD, interstitial lung disease, bladder cancer (2009), T2DM, L eye enucleation; afib,&prior VTE who underwent Lt UE AV fistula repair on 08/30/2019. Last HD session was 08/31/19 apparently took at least 4 attempts to cannulate his AV fistula during HD session---now presenting with increased left upper arm swelling and bruising, shortness of breath and found to have hemoglobin of 6.9 from a baseline around 8 back in January 2021, stool Hemoccult negative. In the ED, pt noted to have elevated troponins above 2700 without acute chest pains or acute EKG changes. EDP discussed case with cardiology at [facility], Cardiologist requested transfer to [facility] for further evaluation as there is no cardiology coverage at [facility] over the weekend.     Today, patient reported having substernal chest pain prior to arrival in the hospital, but currently denies any chest pain, still with shortness of breath, noted left upper extremity swelling, denies any fever/chills, cough, abdominal pain, nausea/vomiting.   Assessment/Plan: Principal Problem:   Symptomatic anemia Active Problems:   Essential hypertension   CAD, NATIVE VESSEL   PVD   COPD (chronic obstructive pulmonary disease) ([name])   Type II diabetes mellitus with renal manifestations ([name])   CAD (coronary artery disease)   ESRD on dialysis ([name])   Elevated troponin   ILD (interstitial lung disease) ([name])   Normocytic anemia   Atrial fibrillation ([name])   H/o CAD with elevated troponin Currently chest pain-free Troponin 2,381-->2,709 (in the setting of ESRD) EKG with no acute ST changes Echo done 06/2018 showed EF of 60 to 65%, repeat Echo pending Cardiology on board, appreciate  recs Patient is not on aspirin as he is on Eliquis, currently held Continue statin, metoprolol Telemetry  Acute on chronic anemia of CKD Hemoglobin 6.9 on presentation, baseline hemoglobin usually above 8 Stool Hemoccult is negative Anemia panel showed iron 30, TIBC 230, sats 13, ferritin 153, folate 9.1 Type and screen done, plan is to transfuse 1 unit of PRBC during HD Nephrology on board Eliquis held by cardiology Daily CBC  Left upper extremity swelling/hematoma Pt underwent Lt UE AV fistula repair on 08/30/2019 Last HD session was 08/31/19 apparently took at least 4 attempts to cannulate his AV fistula [name]. [name] from vascular surgery on board, advised fistula can still be used by sticking above the hematoma, if it does not work, will need TDC and fistula rest Elevate arm, monitor closely  Volume overload in an ESRD patient Patient on chronic TTS HD, last HD session was 08/31/19  Nephrology on board, plan for HD on 09/02/2019 Daily renal panel  Acute on chronic respiratory failure with hypoxia Likely 2/2 above CTA chest without PE, noted bilateral effusion DuoNebs as needed Continue supplemental oxygen as needed  Paroxysmal Afib Currently rate controlled, in sinus rhythm Continue metoprolol Eliquis held by cardiology due to anemia in the setting of possible ischemia Telemetry  Hypertension BP soft Monitor closely  DM type 2 A1c 4.4, in the setting of ESRD  Very sensitive SSI, Accu-Cheks, hypoglycemic protocol Hold home [place]  COPD Continue duo nebs as needed  Rt Lung Nodule 1.3 cm x 1.1 cm noncalcified lung nodule within the posterolateral aspect of the right middle lobe Patient advised to follow-up for outpatient PET CT within the  next month  Chronic pain Continue home opiate regimen--percocets  and Zanaflex            Malnutrition Type:      Malnutrition Characteristics:      Nutrition Interventions:       Estimated  body mass index is 22.2 kg/m as calculated from the following:   Height as of this encounter: 5' 10.5" (1.791 m).   Weight as of this encounter: 71.2 kg.     Code Status: Discussed extensively with patient on 09/02/2019, apparently has been referred to hospice of which patient declines.  Patient stated he would not like to be intubated and put on a mechanical ventilator, but would want every other life-saving measures.  Switch to partial code/DNI  Family Communication: Plan to discuss with daughter  Disposition Plan: Pending consultant signing off, clinical improvement, PT/OT   Consultants: Nephrology Cardiology Vascular surgery  Procedures:  None  Antimicrobials:  None  DVT prophylaxis: SCD, Eliquis held due to anemia   Objective: Vitals:   09/01/19 2128 09/01/19 2255 09/02/19 0500 09/02/19 0941  BP: 124/67 (!) 124/59 (!) 90/53 (!) 90/51  Pulse: (!) 110 (!) 107 78 92  Resp: (!) 21 20 18 18   Temp:  (!) 97.3 F (36.3 C) (!) 97.5 F (36.4 C) 97.8 F (36.6 C)  TempSrc:  Oral Oral Oral  SpO2: 100% 100% 99% 100%  Weight:  71.2 kg    Height:  5' 10.5" (1.791 m)      Intake/Output Summary (Last 24 hours) at 09/02/2019 1108 Last data filed at 09/02/2019 0800 Gross per 24 hour  Intake 140 ml  Output 0 ml  Net 140 ml   Filed Weights   09/01/19 1128 09/01/19 2255  Weight: 72.6 kg 71.2 kg    Exam:  General: NAD, HOH  Cardiovascular: S1, S2 present  Respiratory:  Diminished breath sounds bilaterally, mild bibasilar crackles noted  Abdomen: Soft, nontender, nondistended, bowel sounds present  Musculoskeletal: Trace bilateral pedal edema noted, left upper extremity with fistula noted with positive bruit, significant swelling, tenderness, bruising noted  Skin:  As above  Psychiatry: Normal mood   Data Reviewed: CBC: Recent Labs  Lab 09/01/19 1308 09/02/19 0441  WBC 6.1 6.8  NEUTROABS 4.1  --   HGB 6.9* 7.0*  HCT 22.3* 22.6*  MCV 94.5 93.8  PLT 154 993    Basic Metabolic Panel: Recent Labs  Lab 09/01/19 1308 09/02/19 0441  NA 139 136  K 4.5 4.7  CL 99 97*  CO2 30 27  GLUCOSE 103* 92  BUN 26* 30*  CREATININE 4.97* 6.01*  CALCIUM 8.2* 8.1*   GFR: Estimated Creatinine Clearance: 10.7 mL/min (A) (by C-G formula based on SCr of 6.01 mg/dL (H)). Liver Function Tests: Recent Labs  Lab 09/01/19 1308  AST 36  ALT 12  ALKPHOS 60  BILITOT 0.7  PROT 6.0*  ALBUMIN 2.5*   No results for input(s): LIPASE, AMYLASE in the last 168 hours. No results for input(s): AMMONIA in the last 168 hours. Coagulation Profile: No results for input(s): INR, PROTIME in the last 168 hours. Cardiac Enzymes: No results for input(s): CKTOTAL, CKMB, CKMBINDEX, TROPONINI in the last 168 hours. BNP (last 3 results) No results for input(s): PROBNP in the last 8760 hours. HbA1C: No results for input(s): HGBA1C in the last 72 hours. CBG: Recent Labs  Lab 09/01/19 2248 09/01/19 2320 09/02/19 0727 09/02/19 0758  GLUCAP 69* 72 57* 113*   Lipid Profile: No results for input(s): CHOL, HDL,  LDLCALC, TRIG, CHOLHDL, LDLDIRECT in the last 72 hours. Thyroid Function Tests: No results for input(s): TSH, T4TOTAL, FREET4, T3FREE, THYROIDAB in the last 72 hours. Anemia Panel: Recent Labs    09/01/19 1308  VITAMINB12 297  FOLATE 9.1  FERRITIN 153  TIBC 230*  IRON 30*  RETICCTPCT 2.8   Urine analysis:    Component Value Date/Time   COLORURINE YELLOW 11/05/2018 0601   APPEARANCEUR CLEAR 11/05/2018 0601   LABSPEC 1.012 11/05/2018 0601   PHURINE 7.0 11/05/2018 0601   GLUCOSEU NEGATIVE 11/05/2018 0601   HGBUR LARGE (A) 11/05/2018 0601   BILIRUBINUR NEGATIVE 11/05/2018 0601   KETONESUR NEGATIVE 11/05/2018 0601   PROTEINUR 100 (A) 11/05/2018 0601   NITRITE NEGATIVE 11/05/2018 0601   LEUKOCYTESUR NEGATIVE 11/05/2018 0601   Sepsis Labs: @LABRCNTIP (procalcitonin:4,lacticidven:4)  ) Recent Results (from the past 240 hour(s))  Respiratory Panel by RT  PCR (Flu A&B, Covid) - Nasopharyngeal Swab     Status: None   Collection Time: 09/01/19  5:16 PM   Specimen: Nasopharyngeal Swab  Result Value Ref Range Status   SARS Coronavirus 2 by RT PCR NEGATIVE NEGATIVE Final    Comment: (NOTE) SARS-CoV-2 target nucleic acids are NOT DETECTED. The SARS-CoV-2 RNA is generally detectable in upper respiratoy specimens during the acute phase of infection. The lowest concentration of SARS-CoV-2 viral copies this assay can detect is 131 copies/mL. A negative result does not preclude SARS-Cov-2 infection and should not be used as the sole basis for treatment or other patient management decisions. A negative result may occur with  improper specimen collection/handling, submission of specimen other than nasopharyngeal swab, presence of viral mutation(s) within the areas targeted by this assay, and inadequate number of viral copies (<131 copies/mL). A negative result must be combined with clinical observations, patient history, and epidemiological information. The expected result is Negative. Fact Sheet for Patients:  PinkCheek.be Fact Sheet for Healthcare Providers:  GravelBags.it This test is not yet ap proved or cleared by the [place] FDA and  has been authorized for detection and/or diagnosis of SARS-CoV-2 by FDA under an Emergency Use Authorization (EUA). This EUA will remain  in effect (meaning this test can be used) for the duration of the COVID-19 declaration under Section 564(b)(1) of the Act, 21 U.S.C. section 360bbb-3(b)(1), unless the authorization is terminated or revoked sooner.    Influenza A by PCR NEGATIVE NEGATIVE Final   Influenza B by PCR NEGATIVE NEGATIVE Final    Comment: (NOTE) The Xpert Xpress SARS-CoV-2/FLU/RSV assay is intended as an aid in  the diagnosis of influenza from Nasopharyngeal swab specimens and  should not be used as a sole basis for treatment. Nasal  washings and  aspirates are unacceptable for Xpert Xpress SARS-CoV-2/FLU/RSV  testing. Fact Sheet for Patients: PinkCheek.be Fact Sheet for Healthcare Providers: GravelBags.it This test is not yet approved or cleared by the [place] FDA and  has been authorized for detection and/or diagnosis of SARS-CoV-2 by  FDA under an Emergency Use Authorization (EUA). This EUA will remain  in effect (meaning this test can be used) for the duration of the  Covid-19 declaration under Section 564(b)(1) of the Act, 21  U.S.C. section 360bbb-3(b)(1), unless the authorization is  terminated or revoked. Performed at [facility], [address] [place], [place] [address]   MRSA PCR Screening     Status: None   Collection Time: 09/01/19 11:10 PM   Specimen: Nasal Mucosa; Nasopharyngeal  Result Value Ref Range Status   MRSA by PCR NEGATIVE  NEGATIVE Final    Comment:        The GeneXpert MRSA Assay (FDA approved for NASAL specimens only), is one component of a comprehensive MRSA colonization surveillance program. It is not intended to diagnose MRSA infection nor to guide or monitor treatment for MRSA infections. Performed at [facility], [place] [address], [place], [place] [address]       Studies: CT Angio Chest PE W/Cm &/Or Wo Cm  Result Date: 09/01/2019 CLINICAL DATA:  Shortness of breath x2 days. EXAM: CT ANGIOGRAPHY CHEST WITH CONTRAST TECHNIQUE: Multidetector CT imaging of the chest was performed using the standard protocol during bolus administration of intravenous contrast. Multiplanar CT image reconstructions and MIPs were obtained to evaluate the vascular anatomy. CONTRAST:  198mL OMNIPAQUE IOHEXOL 350 MG/ML SOLN COMPARISON:  Nov 08, 2018 FINDINGS: Cardiovascular: There is marked severity calcification of the thoracic aorta. The subsegmental pulmonary arteries are limited in evaluation secondary to suboptimal opacification  with intravenous contrast. This is most notable within the posterior aspect of the bilateral lung bases. No evidence of pulmonary embolism. Normal heart size. No pericardial effusion. Marked severity coronary artery calcification is seen. A left axillary/femoral bypass graft is seen. Mediastinum/Nodes: No enlarged mediastinal, hilar, or axillary lymph nodes. Thyroid gland, trachea, and esophagus demonstrate no significant findings. Lungs/Pleura: A stable 5 mm calcified lung nodule is seen within the left lower lobe. A stable 0.9 cm x 0.9 cm focus of parenchymal low attenuation is seen within the posterior aspect of the right upper lobe (axial CT image 31, CT series number 6). A 1.3 cm x 1.1 cm noncalcified lung nodule is seen within the posterolateral aspect of the right middle lobe (axial CT image 78, CT series number 6). This represents a new finding when compared to the prior exam. Mild, stable areas of atelectasis and/or infiltrate are seen within the posterior aspect of the bilateral lung bases. There are stable small to moderate size bilateral pleural effusions. No pneumothorax is identified. Upper Abdomen: No acute abnormality. Musculoskeletal: Multilevel degenerative changes seen throughout the thoracic spine. A chronic fracture of the posterior ninth right rib is noted. Two metallic density surgical pins are seen within the right humeral head. Review of the MIP images confirms the above findings. IMPRESSION: 1. No evidence of acute pulmonary embolism. 2. Stable small to moderate size bilateral pleural effusions. 3. Stable 0.9 cm x 0.9 cm focus of parenchymal low attenuation within the posterior aspect of the right upper lobe. 4. Interval development of a 1.3 cm x 1.1 cm noncalcified lung nodule within the posterolateral aspect of the right middle lobe. Given the rapid interval development of this nodule, a neoplastic process cannot be excluded. Recommend short-term interval follow-up and PET-CT for further  evaluation. 5. Stable areas of atelectasis and/or infiltrate within the posterior aspect of the bilateral lung bases. 6. Severe coronary artery calcification. 7. Stable appearance of a left axillary/femoral bypass graft. Aortic Atherosclerosis (ICD10-I70.0). Electronically Signed   By: Virgina Norfolk M.D.   On: 09/01/2019 15:59   DG Chest Portable 1 View  Result Date: 09/01/2019 CLINICAL DATA:  Dyspnea. EXAM: PORTABLE CHEST 1 VIEW COMPARISON:  July 16, 2019. FINDINGS: Stable cardiomediastinal silhouette. No pneumothorax or pleural effusion is noted. Slightly decreased interstitial densities are noted throughout both lungs suggesting improving edema or inflammation. Bony thorax is unremarkable. IMPRESSION: Slightly decreased interstitial densities are noted throughout both lungs suggesting improving edema or inflammation. Electronically Signed   By: Marijo Conception M.D.   On:  09/01/2019 13:17   [place] LT UPPER EXTREM LTD SOFT TISSUE NON VASCULAR  Result Date: 09/02/2019 CLINICAL DATA:  Left arm swelling. Technologist notes state question hematoma in left antecubital fossa. Patient has AV fistula in the left upper extremity. EXAM: ULTRASOUND LEFT UPPER EXTREMITY LIMITED TECHNIQUE: Ultrasound examination of the upper extremity soft tissues was performed in the area of clinical concern. COMPARISON:  None. FINDINGS: Within the area of clinical concern in the antecubital fossa is a complex 4.1 x 3.0 x 3.8 cm rounded lesion that is hypoechoic to surrounding subcutaneous tissues. This is adjacent to a prominent subcutaneous vessel but no internal blood flow. Adjacent vessel demonstrates flow but was not directly evaluated. There is subcutaneous edema adjacent to this structure. IMPRESSION: Heterogeneous hypoechoic 4.1 cm lesion adjacent to a prominent vessel in the left antecubital fossa in the area of clinical concern. There is no internal blood flow. Findings may represent hematoma in the appropriate clinical  setting. Continued clinical follow-up is recommended. Completely thrombosed aneurysm is not entirely excluded, but felt less likely. Electronically Signed   By: Keith Rake M.D.   On: 09/02/2019 00:11    Scheduled Meds: . sodium chloride   Intravenous Once  . apixaban  5 mg Oral BID  . atorvastatin  80 mg Oral QHS  . Chlorhexidine Gluconate Cloth  6 each Topical Q0600  . ezetimibe  10 mg Oral Daily  . insulin aspart  0-5 Units Subcutaneous QHS  . insulin aspart  0-6 Units Subcutaneous TID WC  . metoprolol tartrate  12.5 mg Oral BID  . multivitamin  1 tablet Oral Daily  . nicotine  7 mg Transdermal Daily  . sodium chloride flush  3 mL Intravenous Q12H  . tiZANidine  2 mg Oral QHS  . vitamin E  400 Units Oral Daily    Continuous Infusions: . sodium chloride       LOS: 1 day     Alma Friendly, MD [facility]  If 7PM-7AM, please contact night-coverage [email or web address] 09/02/2019, 11:08 AM
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
## 2019-09-02 NOTE — Plan of Care (Signed)
  Problem: Education: Goal: Knowledge of General Education information will improve Description: Including pain rating scale, medication(s)/side effects and non-pharmacologic comfort measures Outcome: Progressing   Problem: Clinical Measurements: Goal: Diagnostic test results will improve Outcome: Progressing   Problem: Nutrition: Goal: Adequate nutrition will be maintained Outcome: Progressing   
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
## 2019-09-03 LAB — RENAL FUNCTION PANEL
Albumin: 2 g/dL — ABNORMAL LOW (ref 3.5–5.0)
Anion gap: 10 (ref 5–15)
BUN: 17 mg/dL (ref 8–23)
CO2: 30 mmol/L (ref 22–32)
Calcium: 7.6 mg/dL — ABNORMAL LOW (ref 8.9–10.3)
Chloride: 98 mmol/L (ref 98–111)
Creatinine, Ser: 4.16 mg/dL — ABNORMAL HIGH (ref 0.61–1.24)
GFR calc Af Amer: 15 mL/min — ABNORMAL LOW (ref >60)
GFR calc non Af Amer: 13 mL/min — ABNORMAL LOW (ref >60)
Glucose, Bld: 91 mg/dL (ref 70–99)
Phosphorus: 4.9 mg/dL — ABNORMAL HIGH (ref 2.5–4.6)
Potassium: 4.4 mmol/L (ref 3.5–5.1)
Sodium: 138 mmol/L (ref 135–145)

## 2019-09-03 LAB — CBC WITH DIFFERENTIAL/PLATELET
Abs Immature Granulocytes: 0.02 10*3/uL (ref 0.00–0.07)
Basophils Absolute: 0 10*3/uL (ref 0.0–0.1)
Basophils Relative: 1 %
Eosinophils Absolute: 0.2 10*3/uL (ref 0.0–0.5)
Eosinophils Relative: 3 %
HCT: 27.1 % — ABNORMAL LOW (ref 39.0–52.0)
Hemoglobin: 8.7 g/dL — ABNORMAL LOW (ref 13.0–17.0)
Immature Granulocytes: 0 %
Lymphocytes Relative: 35 %
Lymphs Abs: 2.2 10*3/uL (ref 0.7–4.0)
MCH: 29.2 pg (ref 26.0–34.0)
MCHC: 32.1 g/dL (ref 30.0–36.0)
MCV: 90.9 fL (ref 80.0–100.0)
Monocytes Absolute: 0.5 10*3/uL (ref 0.1–1.0)
Monocytes Relative: 7 %
Neutro Abs: 3.4 10*3/uL (ref 1.7–7.7)
Neutrophils Relative %: 54 %
Platelets: 158 10*3/uL (ref 150–400)
RBC: 2.98 MIL/uL — ABNORMAL LOW (ref 4.22–5.81)
RDW: 18.7 % — ABNORMAL HIGH (ref 11.5–15.5)
WBC: 6.3 10*3/uL (ref 4.0–10.5)
nRBC: 0 % (ref 0.0–0.2)

## 2019-09-03 LAB — TROPONIN I (HIGH SENSITIVITY): Troponin I (High Sensitivity): 2321 ng/L (ref <18)

## 2019-09-03 LAB — GLUCOSE, CAPILLARY
Glucose-Capillary: 73 mg/dL (ref 70–99)
Glucose-Capillary: 97 mg/dL (ref 70–99)
Glucose-Capillary: 108 mg/dL — ABNORMAL HIGH (ref 70–99)
Glucose-Capillary: 137 mg/dL — ABNORMAL HIGH (ref 70–99)

## 2019-09-03 MED ORDER — MIDODRINE HCL 5 MG PO TABS
10.0000 mg | ORAL_TABLET | Freq: Three times a day (TID) | ORAL | Status: DC
  Administered 2019-09-03 – 2019-09-07 (×10): 10 mg via ORAL
  Filled 2019-09-03 (×10): qty 2

## 2019-09-03 MED ORDER — ASPIRIN EC 81 MG PO TBEC
81.0000 mg | DELAYED_RELEASE_TABLET | Freq: Every day | ORAL | Status: DC
  Administered 2019-09-03 – 2019-09-07 (×4): 81 mg via ORAL
  Filled 2019-09-03 (×5): qty 1

## 2019-09-03 MED ORDER — RENA-VITE PO TABS
1.0000 | ORAL_TABLET | Freq: Every day | ORAL | Status: DC
  Administered 2019-09-04 – 2019-09-06 (×3): 1 via ORAL
  Filled 2019-09-03 (×3): qty 1

## 2019-09-03 MED ORDER — CALCITRIOL 0.25 MCG PO CAPS
0.2500 ug | ORAL_CAPSULE | ORAL | Status: DC
  Administered 2019-09-07 (×2): 0.25 ug via ORAL
  Filled 2019-09-03 (×2): qty 1

## 2019-09-03 NOTE — Progress Notes (Signed)
Critical lab value of Troponin 2321 relayed to Attending MD and [facility] Cardiology/STEMI weekend call.

## 2019-09-03 NOTE — Evaluation (Signed)
Physical Therapy Evaluation Patient Details Name: [name]. MRN: [id number] DOB: [birth year] Today's Date: 09/03/2019   History of Present Illness  [name]  is a 74 y.o. male  with a history of ESRD on dialysis T/Th/Sat,HTN, CAD, COPD, interstitial lung disease, bladder cancer (2009), T2DM,  L eye enucleation; afib, & prior VTE who underwent Lt UE AV fistula repair on 08/30/2019. Last HD session was 08/31/19 apparently took at least 4 attempts to cannulate his AV fistula during HD session---now presenting with increased left upper arm swelling and bruising, shortness of breath and found to have hemoglobin of 6.9 from a baseline around 8 back in January 2021; Elevated troponins, per Cardiology Etiology of elevated Troponin is not entirely clear.  The elevation is fairly flat.  Troponin I has been elevated in the past.  This may be his baseline in setting of ESRD.  There may also be some demand ischemia due to anemia in the setting coronary artery disease, congestive heart failure, etc.; noteworthy that he has an inoperable hernia which limits his standing tolerance  Clinical Impression   Pt admitted with above diagnosis. Comes from home, where he was managing basic mobility independently with hoveround, and other DME; Presents with decr activity tolerance, decr functional mobility, decr functional capacity; O2 sats dropped with activity -- recommend supplemental O2 for home;  Pt currently with functional limitations due to the deficits listed below (see PT Problem List). Pt will benefit from skilled PT to increase their independence and safety with mobility to allow discharge to the venue listed below.       Follow Up Recommendations Home health PT    Equipment Recommendations  Other (comment)(Rollator RW; supplemental O2)    Recommendations for Other Services       Precautions / Restrictions Precautions Precautions: Fall      Mobility  Bed Mobility Overal bed mobility: Modified  Independent             General bed mobility comments: Slow moving and use of bed features, but no physical assist needed  Transfers Overall transfer level: Needs assistance Equipment used: Rolling walker (2 wheeled) Transfers: Sit to/from Stand Sit to Stand: Min guard         General transfer comment: Minguard for safety; Good rise -- tending to reach to RW for UE support  Ambulation/Gait Ambulation/Gait assistance: Min guard;+2 safety/equipment(second person helpful for O2 and vitals machine ) Gait Distance (Feet): 75 Feet Assistive device: Rolling walker (2 wheeled) Gait Pattern/deviations: Step-through pattern;Decreased step length - right;Decreased step length - left;Trunk flexed Gait velocity: slowed   General Gait Details: Cues to self-monitor for activity tolerance; see also Other PT note of this date for details re: supplemental O2 needs  Stairs            Wheelchair Mobility    Modified Rankin (Stroke Patients Only)       Balance Overall balance assessment: Needs assistance   Sitting balance-Leahy Scale: Fair       Standing balance-Leahy Scale: Poor(approaching Fair)                               Pertinent Vitals/Pain Pain Assessment: Faces Faces Pain Scale: Hurts a little bit Pain Location: Generalized; "I always have chronic pain" Pain Descriptors / Indicators: Aching;Discomfort Pain Intervention(s): Monitored during session    Home Living Family/patient expects to be discharged to:: Private residence Living Arrangements: Alone Available Help at Discharge: Family;Available  PRN/intermittently(step daughter, grandson) Type of Home: Mobile home Home Access: Ramped entrance     Home Layout: One level Home Equipment: Cane - single point;Grab bars - tub/shower;Other (comment);Walker - 2 wheels;Shower seat Additional Comments: hover round scooter; uses tub shower, sits on chair    Prior Function Level of Independence:  Independent with assistive device(s)         Comments: reports he cannot stand long due to his hernia and that it will dislodge if he stands too long. Uses SpO2 during dialysis, states he is "too lazy" to get the MD to get him some for home. Does not drive. Step daughter and grandson help with meds, meals, and getting to/from HD     Hand Dominance   Dominant Hand: Right    Extremity/Trunk Assessment   Upper Extremity Assessment Upper Extremity Assessment: Defer to OT evaluation    Lower Extremity Assessment Lower Extremity Assessment: Generalized weakness       Communication   Communication: HOH  Cognition Arousal/Alertness: Awake/alert Behavior During Therapy: WFL for tasks assessed/performed Overall Cognitive Status: Within Functional Limits for tasks assessed                                        General Comments      Exercises     Assessment/Plan    PT Assessment Patient needs continued PT services  PT Problem List Decreased strength;Decreased range of motion;Decreased activity tolerance;Decreased balance;Decreased mobility;Decreased coordination;Decreased knowledge of use of DME;Decreased knowledge of precautions;Cardiopulmonary status limiting activity;Pain       PT Treatment Interventions DME instruction;Gait training;Stair training;Functional mobility training;Therapeutic activities;Therapeutic exercise;Balance training;Patient/family education    PT Goals (Current goals can be found in the Care Plan section)  Acute Rehab PT Goals Patient Stated Goal: Did not specifically state, but agreeing to walk PT Goal Formulation: With patient Time For Goal Achievement: 09/17/19 Potential to Achieve Goals: Good    Frequency Min 3X/week   Barriers to discharge        Co-evaluation PT/OT/SLP Co-Evaluation/Treatment: Yes Reason for Co-Treatment: For patient/therapist safety;To address functional/ADL transfers PT goals addressed during session:  Mobility/safety with mobility         AM-PAC PT "6 Clicks" Mobility  Outcome Measure Help needed turning from your back to your side while in a flat bed without using bedrails?: None Help needed moving from lying on your back to sitting on the side of a flat bed without using bedrails?: None Help needed moving to and from a bed to a chair (including a wheelchair)?: A Little Help needed standing up from a chair using your arms (e.g., wheelchair or bedside chair)?: A Little Help needed to walk in hospital room?: A Little Help needed climbing 3-5 steps with a railing? : A Little 6 Click Score: 20    End of Session   Activity Tolerance: Patient tolerated treatment well Patient left: in chair;with call bell/phone within reach Nurse Communication: Mobility status PT Visit Diagnosis: Unsteadiness on feet (R26.81);Other abnormalities of gait and mobility (R26.89);Muscle weakness (generalized) (M62.81)    Time: 1610-9604 PT Time Calculation (min) (ACUTE ONLY): 45 min   Charges:   PT Evaluation $PT Eval Moderate Complexity: 1 Mod PT Treatments $Gait Training: 8-22 mins        Roney Marion, PT  Acute Rehabilitation Services Pager [phone] Office [phone]   [place] 09/03/2019, 3:51 PM

## 2019-09-03 NOTE — Progress Notes (Signed)
Physical Therapy  Note  SATURATION QUALIFICATIONS: (This note is used to comply with regulatory documentation for home oxygen)  Patient Saturations on Room Air at Rest = 90%  Patient Saturations on Room Air while Ambulating = 81%  Patient Saturations on 2-3 Liters of oxygen while Ambulating = 87-94%  Please briefly explain why patient needs home oxygen: Patient requires supplemental oxygen to maintain oxygen saturations at acceptable, safe levels with physical activity.  (see also Full PT/OT evals for other details)  Roney Marion, PT  Acute Rehabilitation Services Pager [phone] Office [phone]

## 2019-09-03 NOTE — Progress Notes (Signed)
Progress Note  Patient Name: [name]. Date of Encounter: 09/03/2019  Primary Cardiologist: Dorris Carnes, MD   Subjective   No complaints  Inpatient Medications    Scheduled Meds: . sodium chloride   Intravenous Once  . sodium chloride   Intravenous Once  . atorvastatin  80 mg Oral QHS  . [START ON 09/05/2019] calcitRIOL  0.25 mcg Oral Q T,Th,Sa-HD  . Chlorhexidine Gluconate Cloth  6 each Topical Q0600  . ezetimibe  10 mg Oral Daily  . insulin aspart  0-5 Units Subcutaneous QHS  . insulin aspart  0-6 Units Subcutaneous TID WC  . metoprolol tartrate  12.5 mg Oral BID  . multivitamin  1 tablet Oral Daily  . nicotine  7 mg Transdermal Daily  . sodium chloride flush  3 mL Intravenous Q12H  . tiZANidine  2 mg Oral QHS  . vitamin E  400 Units Oral Daily   Continuous Infusions: . sodium chloride     PRN Meds: sodium chloride, acetaminophen **OR** acetaminophen, erythromycin, ondansetron, oxyCODONE-acetaminophen **AND** oxyCODONE, polyethylene glycol, sodium chloride flush, traZODone   Vital Signs    Vitals:   09/02/19 2033 09/02/19 2202 09/03/19 0451 09/03/19 0911  BP: (!) 95/54 (!) 100/56 104/65 (!) 87/50  Pulse: 76 75 79 75  Resp: 16  19   Temp: 98.4 F (36.9 C)  98.6 F (37 C)   TempSrc:      SpO2: 96% 98% 95%   Weight: 74.1 kg     Height:        Intake/Output Summary (Last 24 hours) at 09/03/2019 0958 Last data filed at 09/03/2019 9163 Gross per 24 hour  Intake 1115 ml  Output 1500 ml  Net -385 ml   Last 3 Weights 09/02/2019 09/02/2019 09/02/2019  Weight (lbs) 163 lb 5.8 oz 163 lb 5.8 oz 169 lb 12.1 oz  Weight (kg) 74.1 kg 74.1 kg 77 kg      Telemetry    NSR - Personally Reviewed  ECG    n/a - Personally Reviewed  Physical Exam   GEN: No acute distress.   Neck: No JVD Cardiac: RRR, no murmurs, rubs, or gallops.  Respiratory: Clear to auscultation bilaterally. GI: Soft, nontender, non-distended  MS: No edema; No deformity. Neuro:  Nonfocal    Psych: Normal affect   Labs    High Sensitivity Troponin:   Recent Labs  Lab 09/01/19 1308 09/01/19 1428  TROPONINIHS 2,381* 2,709*      Chemistry Recent Labs  Lab 09/01/19 1308 09/02/19 0441 09/03/19 0520  NA 139 136 138  K 4.5 4.7 4.4  CL 99 97* 98  CO2 30 27 30   GLUCOSE 103* 92 91  BUN 26* 30* 17  CREATININE 4.97* 6.01* 4.16*  CALCIUM 8.2* 8.1* 7.6*  PROT 6.0*  --   --   ALBUMIN 2.5*  --  2.0*  AST 36  --   --   ALT 12  --   --   ALKPHOS 60  --   --   BILITOT 0.7  --   --   GFRNONAA 11* 8* 13*  GFRAA 12* 10* 15*  ANIONGAP 10 12 10      Hematology Recent Labs  Lab 09/01/19 1308 09/01/19 1308 09/02/19 0441 09/02/19 1839 09/03/19 0520  WBC 6.1  --  6.8  --  6.3  RBC 2.36*  2.38*  --  2.41*  --  2.98*  HGB 6.9*   < > 7.0* 8.9* 8.7*  HCT 22.3*   < >  22.6* 27.6* 27.1*  MCV 94.5  --  93.8  --  90.9  MCH 29.2  --  29.0  --  29.2  MCHC 30.9  --  31.0  --  32.1  RDW 18.4*  --  18.7*  --  18.7*  PLT 154  --  187  --  158   < > = values in this interval not displayed.    BNPNo results for input(s): BNP, PROBNP in the last 168 hours.   DDimer No results for input(s): DDIMER in the last 168 hours.   Radiology    CT Angio Chest PE W/Cm &/Or Wo Cm  Result Date: 09/01/2019 CLINICAL DATA:  Shortness of breath x2 days. EXAM: CT ANGIOGRAPHY CHEST WITH CONTRAST TECHNIQUE: Multidetector CT imaging of the chest was performed using the standard protocol during bolus administration of intravenous contrast. Multiplanar CT image reconstructions and MIPs were obtained to evaluate the vascular anatomy. CONTRAST:  141mL OMNIPAQUE IOHEXOL 350 MG/ML SOLN COMPARISON:  Nov 08, 2018 FINDINGS: Cardiovascular: There is marked severity calcification of the thoracic aorta. The subsegmental pulmonary arteries are limited in evaluation secondary to suboptimal opacification with intravenous contrast. This is most notable within the posterior aspect of the bilateral lung bases. No evidence  of pulmonary embolism. Normal heart size. No pericardial effusion. Marked severity coronary artery calcification is seen. A left axillary/femoral bypass graft is seen. Mediastinum/Nodes: No enlarged mediastinal, hilar, or axillary lymph nodes. Thyroid gland, trachea, and esophagus demonstrate no significant findings. Lungs/Pleura: A stable 5 mm calcified lung nodule is seen within the left lower lobe. A stable 0.9 cm x 0.9 cm focus of parenchymal low attenuation is seen within the posterior aspect of the right upper lobe (axial CT image 31, CT series number 6). A 1.3 cm x 1.1 cm noncalcified lung nodule is seen within the posterolateral aspect of the right middle lobe (axial CT image 78, CT series number 6). This represents a new finding when compared to the prior exam. Mild, stable areas of atelectasis and/or infiltrate are seen within the posterior aspect of the bilateral lung bases. There are stable small to moderate size bilateral pleural effusions. No pneumothorax is identified. Upper Abdomen: No acute abnormality. Musculoskeletal: Multilevel degenerative changes seen throughout the thoracic spine. A chronic fracture of the posterior ninth right rib is noted. Two metallic density surgical pins are seen within the right humeral head. Review of the MIP images confirms the above findings. IMPRESSION: 1. No evidence of acute pulmonary embolism. 2. Stable small to moderate size bilateral pleural effusions. 3. Stable 0.9 cm x 0.9 cm focus of parenchymal low attenuation within the posterior aspect of the right upper lobe. 4. Interval development of a 1.3 cm x 1.1 cm noncalcified lung nodule within the posterolateral aspect of the right middle lobe. Given the rapid interval development of this nodule, a neoplastic process cannot be excluded. Recommend short-term interval follow-up and PET-CT for further evaluation. 5. Stable areas of atelectasis and/or infiltrate within the posterior aspect of the bilateral lung bases.  6. Severe coronary artery calcification. 7. Stable appearance of a left axillary/femoral bypass graft. Aortic Atherosclerosis (ICD10-I70.0). Electronically Signed   By: Virgina Norfolk M.D.   On: 09/01/2019 15:59   DG Chest Portable 1 View  Result Date: 09/01/2019 CLINICAL DATA:  Dyspnea. EXAM: PORTABLE CHEST 1 VIEW COMPARISON:  July 16, 2019. FINDINGS: Stable cardiomediastinal silhouette. No pneumothorax or pleural effusion is noted. Slightly decreased interstitial densities are noted throughout both lungs suggesting improving edema or inflammation.  Bony thorax is unremarkable. IMPRESSION: Slightly decreased interstitial densities are noted throughout both lungs suggesting improving edema or inflammation. Electronically Signed   By: Marijo Conception M.D.   On: 09/01/2019 13:17   ECHOCARDIOGRAM COMPLETE  Result Date: 09/02/2019    ECHOCARDIOGRAM REPORT   Patient Name:   [name]. Date of Exam: 09/02/2019 Medical Rec #:  [id number]         Height:       70.5 in Accession #:    [id number]        Weight:       157.0 lb Date of Birth:  June 04, 1946          BSA:          1.893 m Patient Age:    47 years          BP:           90/53 mmHg Patient Gender: M                 HR:           78 bpm. Exam Location:  Inpatient Procedure: 2D Echo, Cardiac Doppler and Color Doppler Indications:    Dyspnea 786.09/R06.00  History:        Patient has prior history of Echocardiogram examinations, most                 recent 07/20/2018. CAD, COPD, Arrythmias:Atrial Fibrillation;                 Risk Factors:Hypertension, Diabetes, Dyslipidemia and Current                 Smoker. ESRD.  Sonographer:    Clayton Lefort RDCS (AE) Referring Phys: [place]  1. Akinesis of the inferolateral wall with overall low normal LV function; Grade 2 diastolic dysfunction; mild LVH; mild AI; moderate MR; severe LAE; mild TR.  2. Left ventricular ejection fraction, by estimation, is 50 to 55%. The left ventricle has low  normal function. The left ventricle demonstrates regional wall motion abnormalities (see scoring diagram/findings for description). There is mild left ventricular hypertrophy. Left ventricular diastolic parameters are consistent with Grade II diastolic dysfunction (pseudonormalization).  3. Right ventricular systolic function is normal. The right ventricular size is normal. There is mildly elevated pulmonary artery systolic pressure.  4. Left atrial size was severely dilated.  5. The mitral valve is normal in structure and function. Moderate mitral valve regurgitation. No evidence of mitral stenosis.  6. The aortic valve is tricuspid. Aortic valve regurgitation is mild. Mild aortic valve sclerosis is present, with no evidence of aortic valve stenosis.  7. The inferior vena cava is normal in size with greater than 50% respiratory variability, suggesting right atrial pressure of 3 mmHg. FINDINGS  Left Ventricle: Left ventricular ejection fraction, by estimation, is 50 to 55%. The left ventricle has low normal function. The left ventricle demonstrates regional wall motion abnormalities. The left ventricular internal cavity size was normal in size. There is mild left ventricular hypertrophy. Left ventricular diastolic parameters are consistent with Grade II diastolic dysfunction (pseudonormalization). Right Ventricle: The right ventricular size is normal.Right ventricular systolic function is normal. There is mildly elevated pulmonary artery systolic pressure. The tricuspid regurgitant velocity is 2.89 m/s, and with an assumed right atrial pressure of  3 mmHg, the estimated right ventricular systolic pressure is 86.5 mmHg. Left Atrium: Left atrial size was severely dilated. Right Atrium: Right atrial size was normal in  size. Pericardium: There is no evidence of pericardial effusion. Mitral Valve: The mitral valve is normal in structure and function. There is mild thickening of the mitral valve leaflet(s). Normal  mobility of the mitral valve leaflets. Moderate mitral valve regurgitation. No evidence of mitral valve stenosis. Tricuspid Valve: The tricuspid valve is normal in structure. Tricuspid valve regurgitation is mild . No evidence of tricuspid stenosis. Aortic Valve: The aortic valve is tricuspid. Aortic valve regurgitation is mild. Aortic regurgitation PHT measures 316 msec. Mild aortic valve sclerosis is present, with no evidence of aortic valve stenosis. Aortic valve mean gradient measures 6.0 mmHg. Aortic valve peak gradient measures 12.1 mmHg. Aortic valve area, by VTI measures 1.88 cm. Pulmonic Valve: The pulmonic valve was not well visualized. Pulmonic valve regurgitation is not visualized. No evidence of pulmonic stenosis. Aorta: The aortic root is normal in size and structure. Venous: The inferior vena cava is normal in size with greater than 50% respiratory variability, suggesting right atrial pressure of 3 mmHg. IAS/Shunts: No atrial level shunt detected by color flow Doppler. Additional Comments: Akinesis of the inferolateral wall with overall low normal LV function; Grade 2 diastolic dysfunction; mild LVH; mild AI; moderate MR; severe LAE; mild TR.  LEFT VENTRICLE PLAX 2D LVIDd:         4.90 cm  Diastology LVIDs:         3.50 cm  LV e' lateral:   12.60 cm/s LV PW:         1.40 cm  LV E/e' lateral: 9.0 LV IVS:        1.20 cm  LV e' medial:    7.07 cm/s LVOT diam:     2.00 cm  LV E/e' medial:  16.0 LV SV:         57 LV SV Index:   30 LVOT Area:     3.14 cm  RIGHT VENTRICLE             IVC RV Basal diam:  3.00 cm     IVC diam: 2.20 cm RV S prime:     12.60 cm/s TAPSE (M-mode): 1.8 cm LEFT ATRIUM             Index       RIGHT ATRIUM           Index LA diam:        4.70 cm 2.48 cm/m  RA Area:     17.40 cm LA Vol (A2C):   91.8 ml 48.49 ml/m RA Volume:   43.30 ml  22.87 ml/m LA Vol (A4C):   96.2 ml 50.82 ml/m LA Biplane Vol: 95.0 ml 50.18 ml/m  AORTIC VALVE AV Area (Vmax):    1.79 cm AV Area (Vmean):    1.60 cm AV Area (VTI):     1.88 cm AV Vmax:           174.00 cm/s AV Vmean:          116.000 cm/s AV VTI:            0.306 m AV Peak Grad:      12.1 mmHg AV Mean Grad:      6.0 mmHg LVOT Vmax:         99.40 cm/s LVOT Vmean:        58.900 cm/s LVOT VTI:          0.183 m LVOT/AV VTI ratio: 0.60 AI PHT:            316 msec  AORTA Ao Root diam: 3.00 cm MITRAL VALVE                 TRICUSPID VALVE MV Area (PHT): 4.17 cm      TR Peak grad:   33.4 mmHg MV Decel Time: 182 msec      TR Vmax:        289.00 cm/s MR Peak grad:    65.6 mmHg MR Mean grad:    42.0 mmHg   SHUNTS MR Vmax:         405.00 cm/s Systemic VTI:  0.18 m MR Vmean:        305.0 cm/s  Systemic Diam: 2.00 cm MR PISA:         3.08 cm MR PISA Eff ROA: 23 mm MR PISA Radius:  0.70 cm MV E velocity: 113.00 cm/s MV A velocity: 90.60 cm/s MV E/A ratio:  1.25 Kirk Ruths MD Electronically signed by Kirk Ruths MD Signature Date/Time: 09/02/2019/1:05:17 PM    Final    [place] LT UPPER EXTREM LTD SOFT TISSUE NON VASCULAR  Result Date: 09/02/2019 CLINICAL DATA:  Left arm swelling. Technologist notes state question hematoma in left antecubital fossa. Patient has AV fistula in the left upper extremity. EXAM: ULTRASOUND LEFT UPPER EXTREMITY LIMITED TECHNIQUE: Ultrasound examination of the upper extremity soft tissues was performed in the area of clinical concern. COMPARISON:  None. FINDINGS: Within the area of clinical concern in the antecubital fossa is a complex 4.1 x 3.0 x 3.8 cm rounded lesion that is hypoechoic to surrounding subcutaneous tissues. This is adjacent to a prominent subcutaneous vessel but no internal blood flow. Adjacent vessel demonstrates flow but was not directly evaluated. There is subcutaneous edema adjacent to this structure. IMPRESSION: Heterogeneous hypoechoic 4.1 cm lesion adjacent to a prominent vessel in the left antecubital fossa in the area of clinical concern. There is no internal blood flow. Findings may represent hematoma in the  appropriate clinical setting. Continued clinical follow-up is recommended. Completely thrombosed aneurysm is not entirely excluded, but felt less likely. Electronically Signed   By: Keith Rake M.D.   On: 09/02/2019 00:11    Cardiac Studies    Patient Profile     [name]. is a 74 y.o. male with a hx of coronary artery disease s/p BMS to RCA in 2007 (negative Myoview in 2010), PVD (occluded abd Aorta >> s/p Ax-Fem bypass), carotid artery disease, ESRD on dialysis, prior UGI bleed, prior DVT, atrial fibrillation, chronic anticoagulation with Apixaban, COPD, hypertension, hyperlipidemia, diastolic CHF who is being seen today for the evaluation of an elevated Troponin at the request of Dr. Denton Brick.  Assessment & Plan    1. Elevated troponin/History of CAD with prior BMS to RCA in 2017 - presented with progressing SOB, no chest pain - up to 2700 without clear peak. - admitted with significant anemia, Hgb 6.9. - echo this admit LVEF 50-55%, akinesis inferolateral wall. Grade II DDX. Mod MR, mild AI - Jan 2020 echo 60-65%, normal WM  - limited management options in setting of severe anemia - would work to optimize Hgb to 9-10 in setting of ischemia - no anticoag. He is on atorva 80, metoprolol 12.5mg  bid,   - elevated troponin with relative drop in LVEF and new WMA (prior  studies compared, significant change most notable in apical long axis view). Management complicated by significant anemia, though no signs of active bleeding. May have been gradual decline related to his chronic anemia, he was 8.1 in  Jan 2021.  - if H&H remains stable would consider cath tomorrow.    2. Anemia - baselien Hgb 8-9, admitted at 6.9 - FOBT is negative. There has not been active signs of bleeding - given 1 unit pRBCs, Hgb up to 8.9 - follow trend.     3. PAF - eliquis on hold in setting of severe anemia - hold for now and follow H&H and cath eligibility.    4. ESRD - HD per  nephrology   For questions or updates, please contact [place] Please consult [email or web address] for contact info under        Signed, Carlyle Dolly, MD  09/03/2019, 9:58 AM

## 2019-09-03 NOTE — Progress Notes (Addendum)
[facility] Progress Note   Subjective:   Patient seen in room. Reports he is feeling much better this AM. Breathing is improved after HD. Denies CP, palpitations and dizziness. Arm edema improved, pain resolved. Reports some numbness in hand after arm was elevated, resolved with repositioning.  Objective Vitals:   09/02/19 1721 09/02/19 2033 09/02/19 2202 09/03/19 0451  BP: (!) 109/57 (!) 95/54 (!) 100/56 104/65  Pulse: 87 76 75 79  Resp: 18 16  19   Temp:  98.4 F (36.9 C)  98.6 F (37 C)  TempSrc:      SpO2: 93% 96% 98% 95%  Weight:  74.1 kg    Height:       Physical Exam General: Well developed, chronically ill appearing male. Alert and in NAD Heart: RRR, no murmurs, rubs or gallops Lungs: CTA bilaterally without wheezing, rhonchi or rales. O2 via nasal cannula Abdomen: Soft, non-tender, non-distended. +BS Extremities: No edema b/l lower extremities Dialysis Access: LUE Localized edema antecubital region, soft, AVF + thrill/bruit  Additional Objective Labs: Basic Metabolic Panel: Recent Labs  Lab 09/01/19 1308 09/02/19 0441 09/03/19 0520  NA 139 136 138  K 4.5 4.7 4.4  CL 99 97* 98  CO2 30 27 30   GLUCOSE 103* 92 91  BUN 26* 30* 17  CREATININE 4.97* 6.01* 4.16*  CALCIUM 8.2* 8.1* 7.6*  PHOS  --   --  4.9*   Liver Function Tests: Recent Labs  Lab 09/01/19 1308 09/03/19 0520  AST 36  --   ALT 12  --   ALKPHOS 60  --   BILITOT 0.7  --   PROT 6.0*  --   ALBUMIN 2.5* 2.0*   CBC: Recent Labs  Lab 09/01/19 1308 09/01/19 1308 09/02/19 0441 09/02/19 1839 09/03/19 0520  WBC 6.1  --  6.8  --  6.3  NEUTROABS 4.1  --   --   --  3.4  HGB 6.9*   < > 7.0* 8.9* 8.7*  HCT 22.3*   < > 22.6* 27.6* 27.1*  MCV 94.5  --  93.8  --  90.9  PLT 154  --  187  --  158   < > = values in this interval not displayed.   Blood Culture    Component Value Date/Time   SDES URINE, RANDOM 11/05/2018 0602   SPECREQUEST NONE 11/05/2018 0602   CULT  11/05/2018  0602    NO GROWTH Performed at [facility], [place] [address] [place], [place] [address]    REPTSTATUS 11/06/2018 FINAL 11/05/2018 0602   CBG: Recent Labs  Lab 09/02/19 1126 09/02/19 1719 09/02/19 1815 09/02/19 2033 09/03/19 0651  GLUCAP 94 62* 108* 110* 73   Iron Studies:  Recent Labs    09/01/19 1308  IRON 30*  TIBC 230*  FERRITIN 153    Studies/Results: CT Angio Chest PE W/Cm &/Or Wo Cm  Result Date: 09/01/2019 CLINICAL DATA:  Shortness of breath x2 days. EXAM: CT ANGIOGRAPHY CHEST WITH CONTRAST TECHNIQUE: Multidetector CT imaging of the chest was performed using the standard protocol during bolus administration of intravenous contrast. Multiplanar CT image reconstructions and MIPs were obtained to evaluate the vascular anatomy. CONTRAST:  139mL OMNIPAQUE IOHEXOL 350 MG/ML SOLN COMPARISON:  Nov 08, 2018 FINDINGS: Cardiovascular: There is marked severity calcification of the thoracic aorta. The subsegmental pulmonary arteries are limited in evaluation secondary to suboptimal opacification with intravenous contrast. This is most notable within the posterior aspect of the bilateral lung bases. No evidence  of pulmonary embolism. Normal heart size. No pericardial effusion. Marked severity coronary artery calcification is seen. A left axillary/femoral bypass graft is seen. Mediastinum/Nodes: No enlarged mediastinal, hilar, or axillary lymph nodes. Thyroid gland, trachea, and esophagus demonstrate no significant findings. Lungs/Pleura: A stable 5 mm calcified lung nodule is seen within the left lower lobe. A stable 0.9 cm x 0.9 cm focus of parenchymal low attenuation is seen within the posterior aspect of the right upper lobe (axial CT image 31, CT series number 6). A 1.3 cm x 1.1 cm noncalcified lung nodule is seen within the posterolateral aspect of the right middle lobe (axial CT image 78, CT series number 6). This represents a new finding when compared to the prior exam. Mild, stable  areas of atelectasis and/or infiltrate are seen within the posterior aspect of the bilateral lung bases. There are stable small to moderate size bilateral pleural effusions. No pneumothorax is identified. Upper Abdomen: No acute abnormality. Musculoskeletal: Multilevel degenerative changes seen throughout the thoracic spine. A chronic fracture of the posterior ninth right rib is noted. Two metallic density surgical pins are seen within the right humeral head. Review of the MIP images confirms the above findings. IMPRESSION: 1. No evidence of acute pulmonary embolism. 2. Stable small to moderate size bilateral pleural effusions. 3. Stable 0.9 cm x 0.9 cm focus of parenchymal low attenuation within the posterior aspect of the right upper lobe. 4. Interval development of a 1.3 cm x 1.1 cm noncalcified lung nodule within the posterolateral aspect of the right middle lobe. Given the rapid interval development of this nodule, a neoplastic process cannot be excluded. Recommend short-term interval follow-up and PET-CT for further evaluation. 5. Stable areas of atelectasis and/or infiltrate within the posterior aspect of the bilateral lung bases. 6. Severe coronary artery calcification. 7. Stable appearance of a left axillary/femoral bypass graft. Aortic Atherosclerosis (ICD10-I70.0). Electronically Signed   By: Virgina Norfolk M.D.   On: 09/01/2019 15:59   DG Chest Portable 1 View  Result Date: 09/01/2019 CLINICAL DATA:  Dyspnea. EXAM: PORTABLE CHEST 1 VIEW COMPARISON:  July 16, 2019. FINDINGS: Stable cardiomediastinal silhouette. No pneumothorax or pleural effusion is noted. Slightly decreased interstitial densities are noted throughout both lungs suggesting improving edema or inflammation. Bony thorax is unremarkable. IMPRESSION: Slightly decreased interstitial densities are noted throughout both lungs suggesting improving edema or inflammation. Electronically Signed   By: Marijo Conception M.D.   On: 09/01/2019  13:17   ECHOCARDIOGRAM COMPLETE  Result Date: 09/02/2019    ECHOCARDIOGRAM REPORT   Patient Name:   [name]. Date of Exam: 09/02/2019 Medical Rec #:  [id number]         Height:       70.5 in Accession #:    [id number]        Weight:       157.0 lb Date of Birth:  1946/01/03          BSA:          1.893 m Patient Age:    59 years          BP:           90/53 mmHg Patient Gender: M                 HR:           78 bpm. Exam Location:  Inpatient Procedure: 2D Echo, Cardiac Doppler and Color Doppler Indications:    Dyspnea 786.09/R06.00  History:  Patient has prior history of Echocardiogram examinations, most                 recent 07/20/2018. CAD, COPD, Arrythmias:Atrial Fibrillation;                 Risk Factors:Hypertension, Diabetes, Dyslipidemia and Current                 Smoker. ESRD.  Sonographer:    Clayton Lefort RDCS (AE) Referring Phys: [place]  1. Akinesis of the inferolateral wall with overall low normal LV function; Grade 2 diastolic dysfunction; mild LVH; mild AI; moderate MR; severe LAE; mild TR.  2. Left ventricular ejection fraction, by estimation, is 50 to 55%. The left ventricle has low normal function. The left ventricle demonstrates regional wall motion abnormalities (see scoring diagram/findings for description). There is mild left ventricular hypertrophy. Left ventricular diastolic parameters are consistent with Grade II diastolic dysfunction (pseudonormalization).  3. Right ventricular systolic function is normal. The right ventricular size is normal. There is mildly elevated pulmonary artery systolic pressure.  4. Left atrial size was severely dilated.  5. The mitral valve is normal in structure and function. Moderate mitral valve regurgitation. No evidence of mitral stenosis.  6. The aortic valve is tricuspid. Aortic valve regurgitation is mild. Mild aortic valve sclerosis is present, with no evidence of aortic valve stenosis.  7. The inferior vena cava is  normal in size with greater than 50% respiratory variability, suggesting right atrial pressure of 3 mmHg. FINDINGS  Left Ventricle: Left ventricular ejection fraction, by estimation, is 50 to 55%. The left ventricle has low normal function. The left ventricle demonstrates regional wall motion abnormalities. The left ventricular internal cavity size was normal in size. There is mild left ventricular hypertrophy. Left ventricular diastolic parameters are consistent with Grade II diastolic dysfunction (pseudonormalization). Right Ventricle: The right ventricular size is normal.Right ventricular systolic function is normal. There is mildly elevated pulmonary artery systolic pressure. The tricuspid regurgitant velocity is 2.89 m/s, and with an assumed right atrial pressure of  3 mmHg, the estimated right ventricular systolic pressure is 03.4 mmHg. Left Atrium: Left atrial size was severely dilated. Right Atrium: Right atrial size was normal in size. Pericardium: There is no evidence of pericardial effusion. Mitral Valve: The mitral valve is normal in structure and function. There is mild thickening of the mitral valve leaflet(s). Normal mobility of the mitral valve leaflets. Moderate mitral valve regurgitation. No evidence of mitral valve stenosis. Tricuspid Valve: The tricuspid valve is normal in structure. Tricuspid valve regurgitation is mild . No evidence of tricuspid stenosis. Aortic Valve: The aortic valve is tricuspid. Aortic valve regurgitation is mild. Aortic regurgitation PHT measures 316 msec. Mild aortic valve sclerosis is present, with no evidence of aortic valve stenosis. Aortic valve mean gradient measures 6.0 mmHg. Aortic valve peak gradient measures 12.1 mmHg. Aortic valve area, by VTI measures 1.88 cm. Pulmonic Valve: The pulmonic valve was not well visualized. Pulmonic valve regurgitation is not visualized. No evidence of pulmonic stenosis. Aorta: The aortic root is normal in size and structure.  Venous: The inferior vena cava is normal in size with greater than 50% respiratory variability, suggesting right atrial pressure of 3 mmHg. IAS/Shunts: No atrial level shunt detected by color flow Doppler. Additional Comments: Akinesis of the inferolateral wall with overall low normal LV function; Grade 2 diastolic dysfunction; mild LVH; mild AI; moderate MR; severe LAE; mild TR.  LEFT VENTRICLE PLAX 2D LVIDd:  4.90 cm  Diastology LVIDs:         3.50 cm  LV e' lateral:   12.60 cm/s LV PW:         1.40 cm  LV E/e' lateral: 9.0 LV IVS:        1.20 cm  LV e' medial:    7.07 cm/s LVOT diam:     2.00 cm  LV E/e' medial:  16.0 LV SV:         57 LV SV Index:   30 LVOT Area:     3.14 cm  RIGHT VENTRICLE             IVC RV Basal diam:  3.00 cm     IVC diam: 2.20 cm RV S prime:     12.60 cm/s TAPSE (M-mode): 1.8 cm LEFT ATRIUM             Index       RIGHT ATRIUM           Index LA diam:        4.70 cm 2.48 cm/m  RA Area:     17.40 cm LA Vol (A2C):   91.8 ml 48.49 ml/m RA Volume:   43.30 ml  22.87 ml/m LA Vol (A4C):   96.2 ml 50.82 ml/m LA Biplane Vol: 95.0 ml 50.18 ml/m  AORTIC VALVE AV Area (Vmax):    1.79 cm AV Area (Vmean):   1.60 cm AV Area (VTI):     1.88 cm AV Vmax:           174.00 cm/s AV Vmean:          116.000 cm/s AV VTI:            0.306 m AV Peak Grad:      12.1 mmHg AV Mean Grad:      6.0 mmHg LVOT Vmax:         99.40 cm/s LVOT Vmean:        58.900 cm/s LVOT VTI:          0.183 m LVOT/AV VTI ratio: 0.60 AI PHT:            316 msec  AORTA Ao Root diam: 3.00 cm MITRAL VALVE                 TRICUSPID VALVE MV Area (PHT): 4.17 cm      TR Peak grad:   33.4 mmHg MV Decel Time: 182 msec      TR Vmax:        289.00 cm/s MR Peak grad:    65.6 mmHg MR Mean grad:    42.0 mmHg   SHUNTS MR Vmax:         405.00 cm/s Systemic VTI:  0.18 m MR Vmean:        305.0 cm/s  Systemic Diam: 2.00 cm MR PISA:         3.08 cm MR PISA Eff ROA: 23 mm MR PISA Radius:  0.70 cm MV E velocity: 113.00 cm/s MV A velocity:  90.60 cm/s MV E/A ratio:  1.25 Kirk Ruths MD Electronically signed by Kirk Ruths MD Signature Date/Time: 09/02/2019/1:05:17 PM    Final    [place] LT UPPER EXTREM LTD SOFT TISSUE NON VASCULAR  Result Date: 09/02/2019 CLINICAL DATA:  Left arm swelling. Technologist notes state question hematoma in left antecubital fossa. Patient has AV fistula in the left upper extremity. EXAM: ULTRASOUND LEFT UPPER EXTREMITY LIMITED TECHNIQUE: Ultrasound examination of  the upper extremity soft tissues was performed in the area of clinical concern. COMPARISON:  None. FINDINGS: Within the area of clinical concern in the antecubital fossa is a complex 4.1 x 3.0 x 3.8 cm rounded lesion that is hypoechoic to surrounding subcutaneous tissues. This is adjacent to a prominent subcutaneous vessel but no internal blood flow. Adjacent vessel demonstrates flow but was not directly evaluated. There is subcutaneous edema adjacent to this structure. IMPRESSION: Heterogeneous hypoechoic 4.1 cm lesion adjacent to a prominent vessel in the left antecubital fossa in the area of clinical concern. There is no internal blood flow. Findings may represent hematoma in the appropriate clinical setting. Continued clinical follow-up is recommended. Completely thrombosed aneurysm is not entirely excluded, but felt less likely. Electronically Signed   By: Keith Rake M.D.   On: 09/02/2019 00:11   Medications: . sodium chloride     . sodium chloride   Intravenous Once  . sodium chloride   Intravenous Once  . atorvastatin  80 mg Oral QHS  . Chlorhexidine Gluconate Cloth  6 each Topical Q0600  . ezetimibe  10 mg Oral Daily  . insulin aspart  0-5 Units Subcutaneous QHS  . insulin aspart  0-6 Units Subcutaneous TID WC  . metoprolol tartrate  12.5 mg Oral BID  . multivitamin  1 tablet Oral Daily  . nicotine  7 mg Transdermal Daily  . sodium chloride flush  3 mL Intravenous Q12H  . tiZANidine  2 mg Oral QHS  . vitamin E  400 Units Oral Daily     Dialysis Orders: Center: [facility]  on TTS. [address], T 3:30 hr, BFR 400/ DFR Auto 1.5, 2K, 2.5Ca, UF Profile 2, AVF Calcitriol 0.25 mcg PO q HD Mircera 50 mcg IV q 2 weeks- last dose 08/29/19 No binder   Assessment/Plan: 1.  Shortness of breath: CTA negative for PE. Dyspnea likely multifactorial due to anemia and CHF with pleural effusions. Improved after HD. Required midodrine with HD yesterday but tolerated UF 1.5L.  2. CAD: Pt with history of angioplasty and stenting. Troponin > 2700. Transferred from [facility] to [facility] for further cardiology work up. Cards suspect demand ischemia, recommending possible stress testing. Echocardiogram pending. Volume management with HD as above.  3. Left upper extremity edema: Recent LUE AVF repair on 3/3. Had difficulty cannulating 3/4 and patient believes needles infiltrated. LUE US showing hematoma. Vascular surgery recommending arm elevation. We were able to cannulate AVF on 3/6. Edema improved today.  4. ESRD:  TTS schedule, plan for HD 3/9 if volume status remains stable. K+ 4.7.  5.  Hypertension/volume: BP soft, required midodrine with HD as above. Pleural effusions noted on CTA. On metoprolol for a.fib/ CHF. Consider starting midodrine PO before HD.  6.  Anemia: Hgb 6.9 > 7.0 > 8.7 s/p 1 unit PRBC on 3/6. FOBT negative. ESA recently dosed.  7.  Metabolic bone disease: Calcium and phos controlled. Continue calcitriol. Not on phos binder as outpatient. 8.  Nutrition:  Renal diet with fluid restrictions  Anice Paganini, PA-C 09/03/2019, 9:07 AM  [facility] Pager: [phone]  Nephrology attending: Patient was seen and examined at bedside.  Chart reviewed.  I agree with assessment and plan as outlined above.  ESRD on HD admitted with symptomatic anemia, fluid overload.  He has left upper extremity AV fistula site hematoma.  Able to access during dialysis yesterday without any problem.  Seen by vascular surgeon  recommended conservative management including arm elevation and  rest.  Patient feels much better after blood product and HD.  Plan for next dialysis on 3/9.  Katheran James, MD [facility].

## 2019-09-03 NOTE — Plan of Care (Signed)
  Problem: Education: Goal: Knowledge of General Education information will improve Description Including pain rating scale, medication(s)/side effects and non-pharmacologic comfort measures Outcome: Progressing   Problem: Clinical Measurements: Goal: Ability to maintain clinical measurements within normal limits will improve Outcome: Progressing   Problem: Clinical Measurements: Goal: Diagnostic test results will improve Outcome: Progressing   Problem: Coping: Goal: Level of anxiety will decrease Outcome: Progressing   Problem: Pain Managment: Goal: General experience of comfort will improve Outcome: Progressing   Problem: Safety: Goal: Ability to remain free from injury will improve Outcome: Progressing   

## 2019-09-03 NOTE — Progress Notes (Signed)
PROGRESS NOTE  [name]. OVF:[id number] DOB: 1945-10-13 DOA: 09/01/2019 PCP: [name] Frizzle, MD  HPI/Recap of past 24 hours: HPI done by Dr [name]  is a 74 y.o. male with a history of ESRD on dialysisT/Th/Sat,HTN, CAD, COPD, interstitial lung disease, bladder cancer (2009), T2DM, L eye enucleation; afib,&prior VTE who underwent Lt UE AV fistula repair on 08/30/2019. Last HD session was 08/31/19 apparently took at least 4 attempts to cannulate his AV fistula during HD session---now presenting with increased left upper arm swelling and bruising, shortness of breath and found to have hemoglobin of 6.9 from a baseline around 8 back in January 2021, stool Hemoccult negative. In the ED, pt noted to have elevated troponins above 2700 without acute chest pains or acute EKG changes. EDP discussed case with cardiology at [facility], Cardiologist requested transfer to [facility] for further evaluation as there is no cardiology coverage at [facility] over the weekend.     Today, patient denies any new complaints, denies any chest pain, worsening SOB, abdominal pain, N/V, fever/chills.   Assessment/Plan: Principal Problem:   Symptomatic anemia Active Problems:   Essential hypertension   CAD, NATIVE VESSEL   PVD   COPD (chronic obstructive pulmonary disease) ([name])   Type II diabetes mellitus with renal manifestations ([name])   CAD (coronary artery disease)   ESRD on dialysis ([name])   Elevated troponin   ILD (interstitial lung disease) ([name])   Normocytic anemia   Atrial fibrillation ([name])   H/o CAD with elevated troponin Currently chest pain-free Troponin 2,381-->2,709-->2,321 (in the setting of ESRD) EKG with no acute ST changes Echo done 06/2018 showed EF of 60 to 65%, repeat Echo showed akinesis of the inferolateral wall, EF 50-55%, RWMA, grade II DD Cardiology on board, plan for cath if hgb remains stable, continue to hold eliquis Patient is not on aspirin as  he is on Eliquis Continue statin, metoprolol Telemetry  Acute on chronic anemia of CKD Hemoglobin 6.9 on presentation, baseline hemoglobin usually above 8 Stool Hemoccult is negative Anemia panel showed iron 30, TIBC 230, sats 13, ferritin 153, folate 9.1 Transfused 1 unit of PRBC during HD on 09/02/19 Nephrology on board Eliquis held by cardiology Daily CBC  Left upper extremity swelling/hematoma Pt underwent Lt UE AV fistula repair on 08/30/2019 Last HD session was 08/31/19 apparently took at least 4 attempts to cannulate his AV fistula Dr. [name] from vascular surgery on board, advised fistula can still be used by sticking above the hematoma, conservative measure such as elevation, resting recommended Elevate arm, monitor closely  Volume overload in an ESRD patient Patient on chronic TTS HD, last HD session was 08/31/19  Nephrology on board, s/p HD on 09/02/2019 next 09/05/19 Daily renal panel  Acute on chronic respiratory failure with hypoxia Likely 2/2 above CTA chest without PE, noted bilateral effusion DuoNebs as needed Continue supplemental oxygen as needed  Paroxysmal Afib Currently rate controlled, in sinus rhythm Continue metoprolol Eliquis held by cardiology due to anemia in the setting of possible ischemia Telemetry  Hypertension BP soft Start midodrine as per nephrology Dr Carolin Sicks  Monitor closely  DM type 2 A1c 4.4, in the setting of ESRD  Very sensitive SSI, Accu-Cheks, hypoglycemic protocol Hold home [place]  COPD Continue duo nebs as needed  Rt Lung Nodule 1.3 cm x 1.1 cm noncalcified lung nodule within the posterolateral aspect of the right middle lobe Patient advised to follow-up for outpatient PET CT within the next month  Chronic pain Continue home opiate regimen--percocets  and Zanaflex       Malnutrition Type:      Malnutrition Characteristics:      Nutrition Interventions:       Estimated body mass index is 23.11  kg/m as calculated from the following:   Height as of this encounter: 5' 10.5" (1.791 m).   Weight as of this encounter: 74.1 kg.     Code Status: Partial code/DNI  Family Communication: Plan to discuss with daughter once able  Disposition Plan: Pending further work up, possible cath, Astronomer signing off, clinical improvement, PT/OT   Consultants: Nephrology Cardiology Vascular surgery  Procedures:  None  Antimicrobials:  None  DVT prophylaxis: SCD, Eliquis held due to anemia   Objective: Vitals:   09/02/19 2033 09/02/19 2202 09/03/19 0451 09/03/19 0911  BP: (!) 95/54 (!) 100/56 104/65 (!) 87/50  Pulse: 76 75 79 75  Resp: 16  19   Temp: 98.4 F (36.9 C)  98.6 F (37 C)   TempSrc:      SpO2: 96% 98% 95%   Weight: 74.1 kg     Height:        Intake/Output Summary (Last 24 hours) at 09/03/2019 1358 Last data filed at 09/03/2019 1300 Gross per 24 hour  Intake 800 ml  Output 1500 ml  Net -700 ml   Filed Weights   09/02/19 1240 09/02/19 1645 09/02/19 2033  Weight: 77 kg 74.1 kg 74.1 kg    Exam:  General: NAD, HOH  Cardiovascular: S1, S2 present  Respiratory:  Diminished breath sounds bilaterally, mild bibasilar crackles noted  Abdomen: Soft, nontender, nondistended, bowel sounds present  Musculoskeletal: Trace bilateral pedal edema noted, left upper extremity with fistula noted with positive bruit, less swelling/tenderness/bruising noted  Skin:  As above  Psychiatry: Normal mood   Data Reviewed: CBC: Recent Labs  Lab 09/01/19 1308 09/02/19 0441 09/02/19 1839 09/03/19 0520  WBC 6.1 6.8  --  6.3  NEUTROABS 4.1  --   --  3.4  HGB 6.9* 7.0* 8.9* 8.7*  HCT 22.3* 22.6* 27.6* 27.1*  MCV 94.5 93.8  --  90.9  PLT 154 187  --  637   Basic Metabolic Panel: Recent Labs  Lab 09/01/19 1308 09/02/19 0441 09/03/19 0520  NA 139 136 138  K 4.5 4.7 4.4  CL 99 97* 98  CO2 30 27 30   GLUCOSE 103* 92 91  BUN 26* 30* 17  CREATININE 4.97*  6.01* 4.16*  CALCIUM 8.2* 8.1* 7.6*  PHOS  --   --  4.9*   GFR: Estimated Creatinine Clearance: 16.1 mL/min (A) (by C-G formula based on SCr of 4.16 mg/dL (H)). Liver Function Tests: Recent Labs  Lab 09/01/19 1308 09/03/19 0520  AST 36  --   ALT 12  --   ALKPHOS 60  --   BILITOT 0.7  --   PROT 6.0*  --   ALBUMIN 2.5* 2.0*   No results for input(s): LIPASE, AMYLASE in the last 168 hours. No results for input(s): AMMONIA in the last 168 hours. Coagulation Profile: No results for input(s): INR, PROTIME in the last 168 hours. Cardiac Enzymes: No results for input(s): CKTOTAL, CKMB, CKMBINDEX, TROPONINI in the last 168 hours. BNP (last 3 results) No results for input(s): PROBNP in the last 8760 hours. HbA1C: Recent Labs    09/01/19 1308  HGBA1C 4.4*   CBG: Recent Labs  Lab 09/02/19 1719 09/02/19 1815 09/02/19 2033 09/03/19 0651 09/03/19 1135  GLUCAP  62* 108* 110* 73 97   Lipid Profile: No results for input(s): CHOL, HDL, LDLCALC, TRIG, CHOLHDL, LDLDIRECT in the last 72 hours. Thyroid Function Tests: No results for input(s): TSH, T4TOTAL, FREET4, T3FREE, THYROIDAB in the last 72 hours. Anemia Panel: Recent Labs    09/01/19 1308  VITAMINB12 297  FOLATE 9.1  FERRITIN 153  TIBC 230*  IRON 30*  RETICCTPCT 2.8   Urine analysis:    Component Value Date/Time   COLORURINE YELLOW 11/05/2018 0601   APPEARANCEUR CLEAR 11/05/2018 0601   LABSPEC 1.012 11/05/2018 0601   PHURINE 7.0 11/05/2018 0601   GLUCOSEU NEGATIVE 11/05/2018 0601   HGBUR LARGE (A) 11/05/2018 0601   BILIRUBINUR NEGATIVE 11/05/2018 0601   KETONESUR NEGATIVE 11/05/2018 0601   PROTEINUR 100 (A) 11/05/2018 0601   NITRITE NEGATIVE 11/05/2018 0601   LEUKOCYTESUR NEGATIVE 11/05/2018 0601   Sepsis Labs: @LABRCNTIP (procalcitonin:4,lacticidven:4)  ) Recent Results (from the past 240 hour(s))  Respiratory Panel by RT PCR (Flu A&B, Covid) - Nasopharyngeal Swab     Status: None   Collection Time:  09/01/19  5:16 PM   Specimen: Nasopharyngeal Swab  Result Value Ref Range Status   SARS Coronavirus 2 by RT PCR NEGATIVE NEGATIVE Final    Comment: (NOTE) SARS-CoV-2 target nucleic acids are NOT DETECTED. The SARS-CoV-2 RNA is generally detectable in upper respiratoy specimens during the acute phase of infection. The lowest concentration of SARS-CoV-2 viral copies this assay can detect is 131 copies/mL. A negative result does not preclude SARS-Cov-2 infection and should not be used as the sole basis for treatment or other patient management decisions. A negative result may occur with  improper specimen collection/handling, submission of specimen other than nasopharyngeal swab, presence of viral mutation(s) within the areas targeted by this assay, and inadequate number of viral copies (<131 copies/mL). A negative result must be combined with clinical observations, patient history, and epidemiological information. The expected result is Negative. Fact Sheet for Patients:  PinkCheek.be Fact Sheet for Healthcare Providers:  GravelBags.it This test is not yet ap proved or cleared by the [place] FDA and  has been authorized for detection and/or diagnosis of SARS-CoV-2 by FDA under an Emergency Use Authorization (EUA). This EUA will remain  in effect (meaning this test can be used) for the duration of the COVID-19 declaration under Section 564(b)(1) of the Act, 21 U.S.C. section 360bbb-3(b)(1), unless the authorization is terminated or revoked sooner.    Influenza A by PCR NEGATIVE NEGATIVE Final   Influenza B by PCR NEGATIVE NEGATIVE Final    Comment: (NOTE) The Xpert Xpress SARS-CoV-2/FLU/RSV assay is intended as an aid in  the diagnosis of influenza from Nasopharyngeal swab specimens and  should not be used as a sole basis for treatment. Nasal washings and  aspirates are unacceptable for Xpert Xpress SARS-CoV-2/FLU/RSV    testing. Fact Sheet for Patients: PinkCheek.be Fact Sheet for Healthcare Providers: GravelBags.it This test is not yet approved or cleared by the [place] FDA and  has been authorized for detection and/or diagnosis of SARS-CoV-2 by  FDA under an Emergency Use Authorization (EUA). This EUA will remain  in effect (meaning this test can be used) for the duration of the  Covid-19 declaration under Section 564(b)(1) of the Act, 21  U.S.C. section 360bbb-3(b)(1), unless the authorization is  terminated or revoked. Performed at [facility], [address], [place], [place] [address]   MRSA PCR Screening     Status: None   Collection Time: 09/01/19 11:10 PM  Specimen: Nasal Mucosa; Nasopharyngeal  Result Value Ref Range Status   MRSA by PCR NEGATIVE NEGATIVE Final    Comment:        The GeneXpert MRSA Assay (FDA approved for NASAL specimens only), is one component of a comprehensive MRSA colonization surveillance program. It is not intended to diagnose MRSA infection nor to guide or monitor treatment for MRSA infections. Performed at [facility], [place] [address], [place], [place] [address]       Studies: No results found.  Scheduled Meds: . sodium chloride   Intravenous Once  . sodium chloride   Intravenous Once  . aspirin EC  81 mg Oral Daily  . atorvastatin  80 mg Oral QHS  . [START ON 09/05/2019] calcitRIOL  0.25 mcg Oral Q T,Th,Sa-HD  . Chlorhexidine Gluconate Cloth  6 each Topical Q0600  . ezetimibe  10 mg Oral Daily  . insulin aspart  0-5 Units Subcutaneous QHS  . insulin aspart  0-6 Units Subcutaneous TID WC  . metoprolol tartrate  12.5 mg Oral BID  . midodrine  10 mg Oral TID WC  . [START ON 09/04/2019] multivitamin  1 tablet Oral QHS  . nicotine  7 mg Transdermal Daily  . sodium chloride flush  3 mL Intravenous Q12H  . tiZANidine  2 mg Oral QHS  . vitamin E  400 Units Oral Daily    Continuous  Infusions: . sodium chloride       LOS: 2 days     Alma Friendly, MD [facility]  If 7PM-7AM, please contact night-coverage [email or web address] 09/03/2019, 1:58 PM

## 2019-09-03 NOTE — Evaluation (Signed)
Occupational Therapy Evaluation Patient Details Name: [name]. MRN: [id number] DOB: Mar 02, 1946 Today's Date: 09/03/2019    History of Present Illness [name]  is a 74 y.o. male  with a history of ESRD on dialysis T/Th/Sat,HTN, CAD, COPD, interstitial lung disease, inoperable hernia, bladder cancer (2009), T2DM,  L eye enucleation; afib, & prior VTE who underwent Lt UE AV fistula repair on 08/30/2019. Now presenting with increased left upper arm swelling and bruising, shortness of breath and found to have hemoglobin of 6.9 and elevated troponins.   Clinical Impression   PTA pt living at home alone with intermittent IADL assist from family and friends. At time of eval, pt able to complete bed mobility at mod I level and sit <> stands with min guard assist and RW. Pt reports an inoperable hernia at baseline that will dislodge with increased walking- has a hernia belt for this. Pt demonstrates ability to complete LB dressing at mod I level. He completed functional mobility in hallway with RW at min guard-min A level for safety. On RA, pt O2 sats 81% with functional mobility. Pt required 3L [place] to maintain 90 and greater of SpO2. Education given on pursed lip breathing. At this time recommending HHOT at d/c for continued progression of BADL in home environment. Will continue to follow per POC listed below.    Follow Up Recommendations  Home health OT;Supervision - Intermittent    Equipment Recommendations  3 in 1 bedside commode    Recommendations for Other Services       Precautions / Restrictions Precautions Precautions: Fall Precaution Comments: has hernia that will come out with increased standing, has hernia belt in room Restrictions Weight Bearing Restrictions: No      Mobility Bed Mobility Overal bed mobility: Modified Independent             General bed mobility comments: Slow moving and use of bed features, but no physical assist needed  Transfers Overall  transfer level: Needs assistance Equipment used: Rolling walker (2 wheeled) Transfers: Sit to/from Stand Sit to Stand: Min guard         General transfer comment: min guard to rise and steady with cueing for safe hand placement    Balance Overall balance assessment: Needs assistance   Sitting balance-Leahy Scale: Fair     Standing balance support: Bilateral upper extremity supported;During functional activity Standing balance-Leahy Scale: Poor Standing balance comment: reliant on external support to maintain safe standing balance                           ADL either performed or assessed with clinical judgement   ADL Overall ADL's : Needs assistance/impaired Eating/Feeding: Set up;Sitting   Grooming: Set up;Sitting   Upper Body Bathing: Minimal assistance;Sitting   Lower Body Bathing: Set up;Sit to/from stand;Sitting/lateral leans   Upper Body Dressing : Set up;Sitting   Lower Body Dressing: Set up;Sit to/from stand;Sitting/lateral leans Lower Body Dressing Details (indicate cue type and reason): able to don B socks despite limitations from hernia Toilet Transfer: Minimal assistance;Ambulation;RW   Toileting- Clothing Manipulation and Hygiene: Minimal assistance;Sit to/from stand   Tub/ Shower Transfer: Minimal assistance;Shower seat;Ambulation   Functional mobility during ADLs: Min guard;Rolling walker;Cueing for safety General ADL Comments: pt limited by poor activity tolerance for BADL progression     Vision Baseline Vision/History: (L eye blind) Patient Visual Report: No change from baseline Additional Comments: baseline L eye blindness     Perception  Praxis      Pertinent Vitals/Pain Pain Assessment: Faces Faces Pain Scale: Hurts a little bit Pain Location: Generalized; "I always have chronic pain" Pain Descriptors / Indicators: Aching;Discomfort Pain Intervention(s): Monitored during session     Hand Dominance Right    Extremity/Trunk Assessment Upper Extremity Assessment Upper Extremity Assessment: Generalized weakness(Bil rotator cuff injury per pt report, cannot complete AROM past 90s)   Lower Extremity Assessment Lower Extremity Assessment: Defer to PT evaluation       Communication Communication Communication: HOH   Cognition Arousal/Alertness: Awake/alert Behavior During Therapy: WFL for tasks assessed/performed Overall Cognitive Status: Within Functional Limits for tasks assessed                                     General Comments       Exercises     Shoulder Instructions      Home Living Family/patient expects to be discharged to:: Private residence Living Arrangements: Alone Available Help at Discharge: Family;Available PRN/intermittently(step daughter, grandson) Type of Home: Mobile home Home Access: Ramped entrance     Home Layout: One level     Bathroom Shower/Tub: Tub/shower unit;Walk-in shower         Home Equipment: Cane - single point;Grab bars - tub/shower;Other (comment);Walker - 2 wheels;Shower seat   Additional Comments: hover round scooter; uses tub shower, sits on chair      Prior Functioning/Environment Level of Independence: Independent with assistive device(s)        Comments: reports he cannot stand long due to his hernia and that it will dislodge if he stands too long. Uses SpO2 during dialysis, states he is "too lazy" to get the MD to get him some for home. Does not drive. Step daughter and grandson help with meds, meals, and getting to/from HD        OT Problem List: Decreased strength;Decreased knowledge of use of DME or AE;Decreased activity tolerance;Cardiopulmonary status limiting activity;Impaired balance (sitting and/or standing)      OT Treatment/Interventions: Self-care/ADL training;Therapeutic exercise;Patient/family education;Balance training;Energy conservation;Therapeutic activities;DME and/or AE instruction    OT  Goals(Current goals can be found in the care plan section) Acute Rehab OT Goals Patient Stated Goal: return home OT Goal Formulation: With patient Time For Goal Achievement: 09/17/19 Potential to Achieve Goals: Good  OT Frequency: Min 2X/week   Barriers to D/C:            Co-evaluation PT/OT/SLP Co-Evaluation/Treatment: Yes Reason for Co-Treatment: For patient/therapist safety;To address functional/ADL transfers PT goals addressed during session: Mobility/safety with mobility OT goals addressed during session: ADL's and self-care      AM-PAC OT "6 Clicks" Daily Activity     Outcome Measure Help from another person eating meals?: None Help from another person taking care of personal grooming?: None Help from another person toileting, which includes using toliet, bedpan, or urinal?: A Little Help from another person bathing (including washing, rinsing, drying)?: A Little Help from another person to put on and taking off regular upper body clothing?: None Help from another person to put on and taking off regular lower body clothing?: None 6 Click Score: 22   End of Session Equipment Utilized During Treatment: Gait belt;Rolling walker Nurse Communication: Mobility status  Activity Tolerance: Patient tolerated treatment well Patient left: in chair;with call bell/phone within reach;with chair alarm set  OT Visit Diagnosis: Other abnormalities of gait and mobility (R26.89);Muscle weakness (generalized) (M62.81);Unsteadiness on feet (  R26.81)                Time: 1173-5670 OT Time Calculation (min): 43 min Charges:  OT General Charges $OT Visit: 1 Visit OT Evaluation $OT Eval Moderate Complexity: [place], MSOT, OTR/L Acute Rehabilitation Services [facility] Office Number: [phone] Pager: [phone]  Zenovia Jarred 09/03/2019, 4:48 PM

## 2019-09-03 NOTE — Plan of Care (Signed)
  Problem: Activity: Goal: Risk for activity intolerance will decrease Outcome: Progressing   Problem: Skin Integrity: Goal: Risk for impaired skin integrity will decrease Outcome: Progressing   

## 2019-09-03 NOTE — Progress Notes (Signed)
[facility]  Subjective  -states his left arm feels better   Objective 104/65 79 98.6 F (37 C) 19 95%  Intake/Output Summary (Last 24 hours) at 09/03/2019 0849 Last data filed at 09/03/2019 0451 Gross per 24 hour  Intake 875 ml  Output 1500 ml  Net -625 ml    Left brachiocephalic AV fistula with good thrill stable hematoma near the antecubitum that is fairly soft and improved ecchymosis in the forearm  Laboratory Lab Results: Recent Labs    09/02/19 0441 09/02/19 0441 09/02/19 1839 09/03/19 0520  WBC 6.8  --   --  6.3  HGB 7.0*   < > 8.9* 8.7*  HCT 22.6*   < > 27.6* 27.1*  PLT 187  --   --  158   < > = values in this interval not displayed.   BMET Recent Labs    09/02/19 0441 09/03/19 0520  NA 136 138  K 4.7 4.4  CL 97* 98  CO2 27 30  GLUCOSE 92 91  BUN 30* 17  CREATININE 6.01* 4.16*  CALCIUM 8.1* 7.6*    COAG Lab Results  Component Value Date   INR 1.2 10/26/2018   INR 1.5 (H) 10/10/2018   INR 1.4 (H) 10/06/2018   No results found for: PTT  Assessment/Planning:  74 year old male with end-stage renal disease currently dialyzing via left brachiocephalic AV fistula.  We were called to evaluate what appeared to be an infiltration after procedure at [facility] last week.  Hematoma is very focal down by the antecubitum and as noted yesterday there is room to stick above this.  Dialysis successfully accessed this fistula yesterday and got a full session.  Not due for dialysis again until Tuesday.  Vascular will sign off and hopeful that this will just resolve with arm elevation and conservative measures and it appears they have access point for dialysis.  Please call us if anything changes.  Marty Heck 09/03/2019 8:49 AM --

## 2019-09-04 DIAGNOSIS — D649 Anemia, unspecified: Secondary | ICD-10-CM

## 2019-09-04 LAB — CBC WITH DIFFERENTIAL/PLATELET
Abs Immature Granulocytes: 0.02 10*3/uL (ref 0.00–0.07)
Basophils Absolute: 0 10*3/uL (ref 0.0–0.1)
Basophils Relative: 0 %
Eosinophils Absolute: 0.2 10*3/uL (ref 0.0–0.5)
Eosinophils Relative: 3 %
HCT: 28.5 % — ABNORMAL LOW (ref 39.0–52.0)
Hemoglobin: 9.2 g/dL — ABNORMAL LOW (ref 13.0–17.0)
Immature Granulocytes: 0 %
Lymphocytes Relative: 27 %
Lymphs Abs: 1.9 10*3/uL (ref 0.7–4.0)
MCH: 29.9 pg (ref 26.0–34.0)
MCHC: 32.3 g/dL (ref 30.0–36.0)
MCV: 92.5 fL (ref 80.0–100.0)
Monocytes Absolute: 0.5 10*3/uL (ref 0.1–1.0)
Monocytes Relative: 7 %
Neutro Abs: 4.2 10*3/uL (ref 1.7–7.7)
Neutrophils Relative %: 63 %
Platelets: 149 10*3/uL — ABNORMAL LOW (ref 150–400)
RBC: 3.08 MIL/uL — ABNORMAL LOW (ref 4.22–5.81)
RDW: 18.7 % — ABNORMAL HIGH (ref 11.5–15.5)
WBC: 6.8 10*3/uL (ref 4.0–10.5)
nRBC: 0 % (ref 0.0–0.2)

## 2019-09-04 LAB — GLUCOSE, CAPILLARY
Glucose-Capillary: 61 mg/dL — ABNORMAL LOW (ref 70–99)
Glucose-Capillary: 148 mg/dL — ABNORMAL HIGH (ref 70–99)
Glucose-Capillary: 76 mg/dL (ref 70–99)
Glucose-Capillary: 136 mg/dL — ABNORMAL HIGH (ref 70–99)
Glucose-Capillary: 112 mg/dL — ABNORMAL HIGH (ref 70–99)

## 2019-09-04 LAB — RENAL FUNCTION PANEL
Albumin: 2 g/dL — ABNORMAL LOW (ref 3.5–5.0)
Anion gap: 14 (ref 5–15)
BUN: 28 mg/dL — ABNORMAL HIGH (ref 8–23)
CO2: 23 mmol/L (ref 22–32)
Calcium: 7.6 mg/dL — ABNORMAL LOW (ref 8.9–10.3)
Chloride: 98 mmol/L (ref 98–111)
Creatinine, Ser: 5.51 mg/dL — ABNORMAL HIGH (ref 0.61–1.24)
GFR calc Af Amer: 11 mL/min — ABNORMAL LOW (ref >60)
GFR calc non Af Amer: 9 mL/min — ABNORMAL LOW (ref >60)
Glucose, Bld: 82 mg/dL (ref 70–99)
Phosphorus: 6.6 mg/dL — ABNORMAL HIGH (ref 2.5–4.6)
Potassium: 4.5 mmol/L (ref 3.5–5.1)
Sodium: 135 mmol/L (ref 135–145)

## 2019-09-04 MED ORDER — DEXTROSE 50 % IV SOLN
INTRAVENOUS | Status: AC
  Administered 2019-09-04: 50 mL
  Filled 2019-09-04: qty 50

## 2019-09-04 MED ORDER — CHLORHEXIDINE GLUCONATE CLOTH 2 % EX PADS
6.0000 | MEDICATED_PAD | Freq: Every day | CUTANEOUS | Status: DC
  Administered 2019-09-04: 6 via TOPICAL

## 2019-09-04 NOTE — Progress Notes (Addendum)
Progress Note  Patient Name: [name]. Date of Encounter: 09/04/2019  Primary Cardiologist: Dorris Carnes, MD   Subjective   He continues to have on and off SOB.  Inpatient Medications    Scheduled Meds: . sodium chloride   Intravenous Once  . sodium chloride   Intravenous Once  . aspirin EC  81 mg Oral Daily  . atorvastatin  80 mg Oral QHS  . [START ON 09/05/2019] calcitRIOL  0.25 mcg Oral Q T,Th,Sa-HD  . Chlorhexidine Gluconate Cloth  6 each Topical Q0600  . ezetimibe  10 mg Oral Daily  . insulin aspart  0-5 Units Subcutaneous QHS  . insulin aspart  0-6 Units Subcutaneous TID WC  . metoprolol tartrate  12.5 mg Oral BID  . midodrine  10 mg Oral TID WC  . multivitamin  1 tablet Oral QHS  . nicotine  7 mg Transdermal Daily  . sodium chloride flush  3 mL Intravenous Q12H  . tiZANidine  2 mg Oral QHS  . vitamin E  400 Units Oral Daily   Continuous Infusions: . sodium chloride     PRN Meds: sodium chloride, acetaminophen **OR** acetaminophen, erythromycin, ondansetron, oxyCODONE-acetaminophen **AND** oxyCODONE, polyethylene glycol, sodium chloride flush, traZODone   Vital Signs    Vitals:   09/03/19 1622 09/03/19 2055 09/04/19 0454 09/04/19 0912  BP: (!) 99/55 (!) 97/50 101/64 (!) 94/50  Pulse: 81 75 75 76  Resp: 18 17 17 18   Temp: (!) 97.4 F (36.3 C) 98.5 F (36.9 C) 97.6 F (36.4 C) 97.7 F (36.5 C)  TempSrc: Oral  Oral Oral  SpO2: 100% 96% 97% 98%  Weight:  74.1 kg    Height:        Intake/Output Summary (Last 24 hours) at 09/04/2019 0917 Last data filed at 09/04/2019 0600 Gross per 24 hour  Intake 720 ml  Output 0 ml  Net 720 ml   Last 3 Weights 09/03/2019 09/02/2019 09/02/2019  Weight (lbs) 163 lb 6 oz 163 lb 5.8 oz 163 lb 5.8 oz  Weight (kg) 74.105 kg 74.1 kg 74.1 kg      Telemetry    NSR - Personally Reviewed  ECG    n/a - Personally Reviewed  Physical Exam   GEN: No acute distress.   Neck: No JVD Cardiac: RRR, no murmurs, rubs, or  gallops.  Respiratory: Clear to auscultation bilaterally. GI: Soft, nontender, non-distended  MS: No edema; No deformity. Neuro:  Nonfocal  Psych: Normal affect   Labs    High Sensitivity Troponin:   Recent Labs  Lab 09/01/19 1308 09/01/19 1428 09/03/19 1115  TROPONINIHS 2,381* 2,709* 2,321*      Chemistry Recent Labs  Lab 09/01/19 1308 09/01/19 1308 09/02/19 0441 09/03/19 0520 09/04/19 0449  NA 139   < > 136 138 135  K 4.5   < > 4.7 4.4 4.5  CL 99   < > 97* 98 98  CO2 30   < > 27 30 23   GLUCOSE 103*   < > 92 91 82  BUN 26*   < > 30* 17 28*  CREATININE 4.97*   < > 6.01* 4.16* 5.51*  CALCIUM 8.2*   < > 8.1* 7.6* 7.6*  PROT 6.0*  --   --   --   --   ALBUMIN 2.5*  --   --  2.0* 2.0*  AST 36  --   --   --   --   ALT 12  --   --   --   --  ALKPHOS 60  --   --   --   --   BILITOT 0.7  --   --   --   --   GFRNONAA 11*   < > 8* 13* 9*  GFRAA 12*   < > 10* 15* 11*  ANIONGAP 10   < > 12 10 14    < > = values in this interval not displayed.     Hematology Recent Labs  Lab 09/02/19 0441 09/02/19 0441 09/02/19 1839 09/03/19 0520 09/04/19 0449  WBC 6.8  --   --  6.3 6.8  RBC 2.41*  --   --  2.98* 3.08*  HGB 7.0*   < > 8.9* 8.7* 9.2*  HCT 22.6*   < > 27.6* 27.1* 28.5*  MCV 93.8  --   --  90.9 92.5  MCH 29.0  --   --  29.2 29.9  MCHC 31.0  --   --  32.1 32.3  RDW 18.7*  --   --  18.7* 18.7*  PLT 187  --   --  158 149*   < > = values in this interval not displayed.    BNPNo results for input(s): BNP, PROBNP in the last 168 hours.   DDimer No results for input(s): DDIMER in the last 168 hours.   Radiology    No results found.  Cardiac Studies    Patient Profile     Bryston Colocho. is a 74 y.o. male with a hx of coronary artery disease s/p BMS to RCA in 2007 (negative Myoview in 2010), PVD (occluded abd Aorta >> s/p Ax-Fem bypass), carotid artery disease, ESRD on dialysis, prior UGI bleed, prior DVT, atrial fibrillation, chronic anticoagulation with  Apixaban, COPD, hypertension, hyperlipidemia, diastolic CHF who is being seen today for the evaluation of an elevated Troponin at the request of Dr. Denton Brick.  Assessment & Plan    1. Elevated troponin/History of CAD with prior BMS to RCA in 2017 - presented with progressing SOB, no chest pain - up to 2700 without clear peak. - admitted with significant anemia, Hgb 6.9. Today stable Hb at 9.2. - echo this admit LVEF 50-55%, akinesis inferolateral wall. Grade II DDX. Mod MR, mild AI - Jan 2020 echo 60-65%, normal WM  - limited management options in setting of severe anemia - would work to optimize Hgb to 9-10 in setting of ischemia - no anticoag. He is on atorva 80, metoprolol 12.5mg  bid,   - elevated troponin with relative drop in LVEF and new WMA (prior  studies compared, significant change most notable in apical long axis view). Management complicated by significant anemia, though no signs of active bleeding. May have been gradual decline related to his chronic anemia, he was 8.1 in Jan 2021.  - We will arrange for a cardiac cath today.   2. Anemia - baselien Hgb 8-9, admitted at 6.9 - FOBT is negative. There has not been active signs of bleeding - given 1 unit pRBCs, Hgb up to 9.2 - follow trend.   3. PAF - eliquis on hold in setting of severe anemia - hold for now and follow H&H and cath eligibility.   4. ESRD - HD per nephrology  For questions or updates, please contact [place] Please consult [email or web address] for contact info under        Signed, Ena Dawley, MD  09/04/2019, 9:17 AM

## 2019-09-04 NOTE — Progress Notes (Signed)
PROGRESS NOTE  [name]. QPY:[id number] DOB: 10-12-1945 DOA: 09/01/2019 PCP: [name], MD  HPI/Recap of past 24 hours: HPI done by Dr [name] Murdoch  is a 74 y.o. male with a history of ESRD on dialysisT/Th/Sat,HTN, CAD, COPD, interstitial lung disease, bladder cancer (2009), T2DM, L eye enucleation; afib,&prior VTE who underwent Lt UE AV fistula repair on 08/30/2019. Last HD session was 08/31/19 apparently took at least 4 attempts to cannulate his AV fistula during HD session---now presenting with increased left upper arm swelling and bruising, shortness of breath and found to have hemoglobin of 6.9 from a baseline around 8 back in January 2021, stool Hemoccult negative. In the ED, pt noted to have elevated troponins above 2700 without acute chest pains or acute EKG changes. EDP discussed case with cardiology at [facility], Cardiologist requested transfer to [facility] for further evaluation as there is no cardiology coverage at [facility] over the weekend.     Today, patient reports feeling fatigued, still intermittently short of breath, hungry.  Denies any chest pain, abdominal pain, nausea/vomiting, fever/chills.  Plan is to have cardiac cath today.  Noted an episode of hypoglycemia this a.m. as patient was n.p.o. left upper extremity currently less swollen and less tender     Assessment/Plan: Principal Problem:   Symptomatic anemia Active Problems:   Essential hypertension   CAD, NATIVE VESSEL   PVD   COPD (chronic obstructive pulmonary disease) ([name])   Type II diabetes mellitus with renal manifestations ([name])   CAD (coronary artery disease)   ESRD on dialysis ([name])   Elevated troponin   ILD (interstitial lung disease) ([name])   Normocytic anemia   Atrial fibrillation ([name])   H/o CAD with elevated troponin Currently chest pain-free Troponin 2,381-->2,709-->2,321 (in the setting of ESRD) EKG with no acute ST changes Echo done 06/2018 showed EF of  60 to 65%, repeat Echo showed akinesis of the inferolateral wall, EF 50-55%, RWMA, grade II DD Cardiology on board, plan for cath on 09/05/2019 Continue statin, metoprolol Telemetry  Acute on chronic anemia of CKD Hemoglobin 6.9 on presentation, baseline hemoglobin usually above 8 Stool Hemoccult is negative Anemia panel showed iron 30, TIBC 230, sats 13, ferritin 153, folate 9.1 Transfused 1 unit of PRBC during HD on 09/02/19 Nephrology on board Eliquis held by cardiology Daily CBC  Left upper extremity swelling/hematoma Dr. [name] from vascular surgery on board, advised fistula can still be used by sticking above the hematoma, conservative measure such as elevation, resting recommended Elevate arm, monitor closely  Volume overload in an ESRD patient Patient on TTS HD, last HD session was 09/02/19  Nephrology on board Daily renal panel  Acute on chronic respiratory failure with hypoxia Likely 2/2 above CTA chest without PE, noted bilateral effusion DuoNebs as needed Continue supplemental oxygen as needed  Paroxysmal Afib Currently rate controlled, in sinus rhythm Continue metoprolol Eliquis held by cardiology due to anemia in the setting of possible ischemia Telemetry  Hypertension BP soft Start midodrine as per nephrology Dr Carolin Sicks  Monitor closely  DM type 2 A1c 4.4, in the setting of ESRD  Very sensitive SSI, Accu-Cheks, hypoglycemic protocol Hold home [place]  COPD Continue duo nebs as needed  Rt Lung Nodule 1.3 cm x 1.1 cm noncalcified lung nodule within the posterolateral aspect of the right middle lobe Patient advised to follow-up for outpatient PET CT within the next month  Chronic pain Continue home opiate regimen--percocets  and Zanaflex  Malnutrition Type:      Malnutrition Characteristics:      Nutrition Interventions:       Estimated body mass index is 23.11 kg/m as calculated from the following:   Height as of  this encounter: 5' 10.5" (1.791 m).   Weight as of this encounter: 74.1 kg.     Code Status: Partial code/DNI  Family Communication: Attempted to reach daughter [name], no answer  Disposition Plan: Pending further work up, cardiac cath, pending consultant signing off, clinical improvement, PT/OT   Consultants: Nephrology Cardiology Vascular surgery  Procedures:  None  Antimicrobials:  None  DVT prophylaxis: SCD, Eliquis held due to anemia   Objective: Vitals:   09/04/19 0454 09/04/19 0912 09/04/19 1510 09/04/19 1700  BP: 101/64 (!) 94/50 111/60 (!) 100/58  Pulse: 75 76 86 74  Resp: 17 18  16   Temp: 97.6 F (36.4 C) 97.7 F (36.5 C)  (!) 97.5 F (36.4 C)  TempSrc: Oral Oral  Oral  SpO2: 97% 98% 98% 100%  Weight:      Height:        Intake/Output Summary (Last 24 hours) at 09/04/2019 1719 Last data filed at 09/04/2019 1300 Gross per 24 hour  Intake 360 ml  Output 400 ml  Net -40 ml   Filed Weights   09/02/19 1645 09/02/19 2033 09/03/19 2055  Weight: 74.1 kg 74.1 kg 74.1 kg    Exam:  General: NAD, HOH  Cardiovascular: S1, S2 present  Respiratory:  Diminished breath sounds bilaterally, mild bibasilar crackles noted  Abdomen: Soft, nontender, nondistended, bowel sounds present  Musculoskeletal: Trace bilateral pedal edema noted, left upper extremity with fistula noted with positive bruit, less swelling/tenderness/bruising noted  Skin:  As above  Psychiatry: Normal mood   Data Reviewed: CBC: Recent Labs  Lab 09/01/19 1308 09/02/19 0441 09/02/19 1839 09/03/19 0520 09/04/19 0449  WBC 6.1 6.8  --  6.3 6.8  NEUTROABS 4.1  --   --  3.4 4.2  HGB 6.9* 7.0* 8.9* 8.7* 9.2*  HCT 22.3* 22.6* 27.6* 27.1* 28.5*  MCV 94.5 93.8  --  90.9 92.5  PLT 154 187  --  158 852*   Basic Metabolic Panel: Recent Labs  Lab 09/01/19 1308 09/02/19 0441 09/03/19 0520 09/04/19 0449  NA 139 136 138 135  K 4.5 4.7 4.4 4.5  CL 99 97* 98 98  CO2 30 27 30 23     GLUCOSE 103* 92 91 82  BUN 26* 30* 17 28*  CREATININE 4.97* 6.01* 4.16* 5.51*  CALCIUM 8.2* 8.1* 7.6* 7.6*  PHOS  --   --  4.9* 6.6*   GFR: Estimated Creatinine Clearance: 12.1 mL/min (A) (by C-G formula based on SCr of 5.51 mg/dL (H)). Liver Function Tests: Recent Labs  Lab 09/01/19 1308 09/03/19 0520 09/04/19 0449  AST 36  --   --   ALT 12  --   --   ALKPHOS 60  --   --   BILITOT 0.7  --   --   PROT 6.0*  --   --   ALBUMIN 2.5* 2.0* 2.0*   No results for input(s): LIPASE, AMYLASE in the last 168 hours. No results for input(s): AMMONIA in the last 168 hours. Coagulation Profile: No results for input(s): INR, PROTIME in the last 168 hours. Cardiac Enzymes: No results for input(s): CKTOTAL, CKMB, CKMBINDEX, TROPONINI in the last 168 hours. BNP (last 3 results) No results for input(s): PROBNP in the last 8760 hours. HbA1C: No results for input(s):  HGBA1C in the last 72 hours. CBG: Recent Labs  Lab 09/03/19 2056 09/04/19 0711 09/04/19 0754 09/04/19 1122 09/04/19 1630  GLUCAP 137* 61* 148* 76 136*   Lipid Profile: No results for input(s): CHOL, HDL, LDLCALC, TRIG, CHOLHDL, LDLDIRECT in the last 72 hours. Thyroid Function Tests: No results for input(s): TSH, T4TOTAL, FREET4, T3FREE, THYROIDAB in the last 72 hours. Anemia Panel: No results for input(s): VITAMINB12, FOLATE, FERRITIN, TIBC, IRON, RETICCTPCT in the last 72 hours. Urine analysis:    Component Value Date/Time   COLORURINE YELLOW 11/05/2018 0601   APPEARANCEUR CLEAR 11/05/2018 0601   LABSPEC 1.012 11/05/2018 0601   PHURINE 7.0 11/05/2018 0601   GLUCOSEU NEGATIVE 11/05/2018 0601   HGBUR LARGE (A) 11/05/2018 0601   BILIRUBINUR NEGATIVE 11/05/2018 0601   KETONESUR NEGATIVE 11/05/2018 0601   PROTEINUR 100 (A) 11/05/2018 0601   NITRITE NEGATIVE 11/05/2018 0601   LEUKOCYTESUR NEGATIVE 11/05/2018 0601   Sepsis Labs: @LABRCNTIP (procalcitonin:4,lacticidven:4)  ) Recent Results (from the past 240  hour(s))  Respiratory Panel by RT PCR (Flu A&B, Covid) - Nasopharyngeal Swab     Status: None   Collection Time: 09/01/19  5:16 PM   Specimen: Nasopharyngeal Swab  Result Value Ref Range Status   SARS Coronavirus 2 by RT PCR NEGATIVE NEGATIVE Final    Comment: (NOTE) SARS-CoV-2 target nucleic acids are NOT DETECTED. The SARS-CoV-2 RNA is generally detectable in upper respiratoy specimens during the acute phase of infection. The lowest concentration of SARS-CoV-2 viral copies this assay can detect is 131 copies/mL. A negative result does not preclude SARS-Cov-2 infection and should not be used as the sole basis for treatment or other patient management decisions. A negative result may occur with  improper specimen collection/handling, submission of specimen other than nasopharyngeal swab, presence of viral mutation(s) within the areas targeted by this assay, and inadequate number of viral copies (<131 copies/mL). A negative result must be combined with clinical observations, patient history, and epidemiological information. The expected result is Negative. Fact Sheet for Patients:  PinkCheek.be Fact Sheet for Healthcare Providers:  GravelBags.it This test is not yet ap proved or cleared by the [place] FDA and  has been authorized for detection and/or diagnosis of SARS-CoV-2 by FDA under an Emergency Use Authorization (EUA). This EUA will remain  in effect (meaning this test can be used) for the duration of the COVID-19 declaration under Section 564(b)(1) of the Act, 21 U.S.C. section 360bbb-3(b)(1), unless the authorization is terminated or revoked sooner.    Influenza A by PCR NEGATIVE NEGATIVE Final   Influenza B by PCR NEGATIVE NEGATIVE Final    Comment: (NOTE) The Xpert Xpress SARS-CoV-2/FLU/RSV assay is intended as an aid in  the diagnosis of influenza from Nasopharyngeal swab specimens and  should not be used as  a sole basis for treatment. Nasal washings and  aspirates are unacceptable for Xpert Xpress SARS-CoV-2/FLU/RSV  testing. Fact Sheet for Patients: PinkCheek.be Fact Sheet for Healthcare Providers: GravelBags.it This test is not yet approved or cleared by the [place] FDA and  has been authorized for detection and/or diagnosis of SARS-CoV-2 by  FDA under an Emergency Use Authorization (EUA). This EUA will remain  in effect (meaning this test can be used) for the duration of the  Covid-19 declaration under Section 564(b)(1) of the Act, 21  U.S.C. section 360bbb-3(b)(1), unless the authorization is  terminated or revoked. Performed at [facility], [address], [place], [place] [address]   MRSA PCR Screening     Status:  None   Collection Time: 09/01/19 11:10 PM   Specimen: Nasal Mucosa; Nasopharyngeal  Result Value Ref Range Status   MRSA by PCR NEGATIVE NEGATIVE Final    Comment:        The GeneXpert MRSA Assay (FDA approved for NASAL specimens only), is one component of a comprehensive MRSA colonization surveillance program. It is not intended to diagnose MRSA infection nor to guide or monitor treatment for MRSA infections. Performed at [facility], [place] [address], [place], [place] [address]       Studies: No results found.  Scheduled Meds: . aspirin EC  81 mg Oral Daily  . atorvastatin  80 mg Oral QHS  . [START ON 09/05/2019] calcitRIOL  0.25 mcg Oral Q T,Th,Sa-HD  . Chlorhexidine Gluconate Cloth  6 each Topical Q0600  . ezetimibe  10 mg Oral Daily  . insulin aspart  0-5 Units Subcutaneous QHS  . insulin aspart  0-6 Units Subcutaneous TID WC  . metoprolol tartrate  12.5 mg Oral BID  . midodrine  10 mg Oral TID WC  . multivitamin  1 tablet Oral QHS  . nicotine  7 mg Transdermal Daily  . sodium chloride flush  3 mL Intravenous Q12H  . tiZANidine  2 mg Oral QHS  . vitamin E  400 Units Oral Daily     Continuous Infusions: . sodium chloride       LOS: 3 days     Alma Friendly, MD [facility]  If 7PM-7AM, please contact night-coverage [email or web address] 09/04/2019, 5:19 PM

## 2019-09-04 NOTE — Progress Notes (Signed)
[facility] Progress Note   Subjective:   Patient seen in room. Feeling fatigued this AM, still with dyspnea with exertion. Denies CP and palpitations at present. No abdominal pain, N/V/D. Arm edema and numbness improved.   Objective Vitals:   09/03/19 1622 09/03/19 2055 09/04/19 0454 09/04/19 0912  BP: (!) 99/55 (!) 97/50 101/64 (!) 94/50  Pulse: 81 75 75 76  Resp: 18 17 17 18   Temp: (!) 97.4 F (36.3 C) 98.5 F (36.9 C) 97.6 F (36.4 C) 97.7 F (36.5 C)  TempSrc: Oral  Oral Oral  SpO2: 100% 96% 97% 98%  Weight:  74.1 kg    Height:       Physical Exam General: Well developed, chronically ill male in NAD Heart: RRR, no murmurs, rubs or gallops Lungs: Crackles LLL, no wheezing or rales. On O2 via nasal cannula Abdomen: Soft, non-tender, non-distended. +BS Extremities: No edema b/l lower extremities Dialysis Access: LUE Localized edema antecubital region, improving, soft, AVF + thrill/bruit  Additional Objective Labs: Basic Metabolic Panel: Recent Labs  Lab 09/02/19 0441 09/03/19 0520 09/04/19 0449  NA 136 138 135  K 4.7 4.4 4.5  CL 97* 98 98  CO2 27 30 23   GLUCOSE 92 91 82  BUN 30* 17 28*  CREATININE 6.01* 4.16* 5.51*  CALCIUM 8.1* 7.6* 7.6*  PHOS  --  4.9* 6.6*   Liver Function Tests: Recent Labs  Lab 09/01/19 1308 09/03/19 0520 09/04/19 0449  AST 36  --   --   ALT 12  --   --   ALKPHOS 60  --   --   BILITOT 0.7  --   --   PROT 6.0*  --   --   ALBUMIN 2.5* 2.0* 2.0*   CBC: Recent Labs  Lab 09/01/19 1308 09/01/19 1308 09/02/19 0441 09/02/19 0441 09/02/19 1839 09/03/19 0520 09/04/19 0449  WBC 6.1   < > 6.8  --   --  6.3 6.8  NEUTROABS 4.1  --   --   --   --  3.4 4.2  HGB 6.9*   < > 7.0*   < > 8.9* 8.7* 9.2*  HCT 22.3*   < > 22.6*   < > 27.6* 27.1* 28.5*  MCV 94.5  --  93.8  --   --  90.9 92.5  PLT 154   < > 187  --   --  158 149*   < > = values in this interval not displayed.   Blood Culture    Component Value Date/Time   SDES URINE, RANDOM 11/05/2018 0602   SPECREQUEST NONE 11/05/2018 0602   CULT  11/05/2018 0602    NO GROWTH Performed at [facility], [place] [address] [place], [place] [address]    REPTSTATUS 11/06/2018 FINAL 11/05/2018 0602    CBG: Recent Labs  Lab 09/03/19 1135 09/03/19 1621 09/03/19 2056 09/04/19 0711 09/04/19 0754  GLUCAP 97 108* 137* 61* 148*   Iron Studies:  Recent Labs    09/01/19 1308  IRON 30*  TIBC 230*  FERRITIN 153   Medications: . sodium chloride     . sodium chloride   Intravenous Once  . sodium chloride   Intravenous Once  . aspirin EC  81 mg Oral Daily  . atorvastatin  80 mg Oral QHS  . [START ON 09/05/2019] calcitRIOL  0.25 mcg Oral Q T,Th,Sa-HD  . Chlorhexidine Gluconate Cloth  6 each Topical Q0600  . ezetimibe  10 mg Oral Daily  .  insulin aspart  0-5 Units Subcutaneous QHS  . insulin aspart  0-6 Units Subcutaneous TID WC  . metoprolol tartrate  12.5 mg Oral BID  . midodrine  10 mg Oral TID WC  . multivitamin  1 tablet Oral QHS  . nicotine  7 mg Transdermal Daily  . sodium chloride flush  3 mL Intravenous Q12H  . tiZANidine  2 mg Oral QHS  . vitamin E  400 Units Oral Daily    [facility], T 3:30 hr, BFR 400/ DFR Auto 1.5, 2K, 2.5Ca, UF Profile 2, AVF Calcitriol 0.25 mcg PO q HD Mircera 50 mcg IV q 2 weeks- last dose 08/29/19 No binder  Assessment/Plan: 1. Shortness of breath: CTA negative for PE. Dyspnea likely multifactorial due to CAD, anemia and CHF with pleural effusions. Improved after HD. Required midodrine with HD. 2. CAD: Pt with history of angioplasty and stenting. Troponin >2700. Transferred [facility] for further cardiology work up. New relative drop in LVEF and WMA, planned for cardiac cath today.  3. Left upper extremity edema:Recent LUE AVF procedure on 3/3. Had difficulty cannulating 3/4 and patient believes needles infiltrated. LUE US showing hematoma. Vascular  surgery recommending arm elevation. We were able to cannulate AVF on 3/6. Edema improved today. 4. ESRD:TTS schedule, plan for HD 3/9. K+ 4.5.  5. Hypertension/volume:BP soft, required midodrine. Pleural effusions noted on CTA. On metoprolol for a.fib/ CHF.  6. Anemia:Hgb 6.9 >>> 9.2. s/p 1 unit PRBC on 3/6. FOBT negative. ESA recently dosed.  7. Metabolic bone disease:Calcium and phos controlled. Continue calcitriol. Phos up today, not on phos binder as outpatient, follow, may need to start.  8. Nutrition:Renal diet with fluid restrictions  Anice Paganini, PA-C 09/04/2019, 10:59 AM  [facility] Pager: [phone]

## 2019-09-04 NOTE — Interval H&P Note (Signed)
History and Physical Interval Note:  09/04/2019 5:51 PM  [name].  has presented today for surgery, with the diagnosis of n stemi.  The various methods of treatment have been discussed with the patient and family. After consideration of risks, benefits and other options for treatment, the patient has consented to  Procedure(s): LEFT HEART CATH AND CORONARY ANGIOGRAPHY (N/A) as a surgical intervention.  The patient's history has been reviewed, patient examined, no change in status, stable for surgery.  I have reviewed the patient's chart and labs.  Questions were answered to the patient's satisfaction.     Belva Crome III

## 2019-09-04 NOTE — Interval H&P Note (Signed)
Cath Lab Visit (complete for each Cath Lab visit)  Clinical Evaluation Leading to the Procedure:   ACS: No.  Non-ACS:    Anginal Classification: CCS [name]  Anti-ischemic medical therapy: Minimal Therapy (1 class of medications)  Non-Invasive Test Results: No non-invasive testing performed  Prior CABG: No previous CABG    Admitted with Anemia, presumed GI related, and elevated troponinI Has Ax - Fem bypass and totally occluded iliacs.  History and Physical Interval Note:  09/04/2019 5:48 PM  [name].  has presented today for surgery, with the diagnosis of n stemi.  The various methods of treatment have been discussed with the patient and family. After consideration of risks, benefits and other options for treatment, the patient has consented to  Procedure(s): LEFT HEART CATH AND CORONARY ANGIOGRAPHY (N/A) as a surgical intervention.  The patient's history has been reviewed, patient examined, no change in status, stable for surgery.  I have reviewed the patient's chart and labs.  Questions were answered to the patient's satisfaction.     [name]

## 2019-09-04 NOTE — H&P (View-Only) (Signed)
Progress Note  Patient Name: [name]. Date of Encounter: 09/04/2019  Primary Cardiologist: Dorris Carnes, MD   Subjective   He continues to have on and off SOB.  Inpatient Medications    Scheduled Meds: . sodium chloride   Intravenous Once  . sodium chloride   Intravenous Once  . aspirin EC  81 mg Oral Daily  . atorvastatin  80 mg Oral QHS  . [START ON 09/05/2019] calcitRIOL  0.25 mcg Oral Q T,Th,Sa-HD  . Chlorhexidine Gluconate Cloth  6 each Topical Q0600  . ezetimibe  10 mg Oral Daily  . insulin aspart  0-5 Units Subcutaneous QHS  . insulin aspart  0-6 Units Subcutaneous TID WC  . metoprolol tartrate  12.5 mg Oral BID  . midodrine  10 mg Oral TID WC  . multivitamin  1 tablet Oral QHS  . nicotine  7 mg Transdermal Daily  . sodium chloride flush  3 mL Intravenous Q12H  . tiZANidine  2 mg Oral QHS  . vitamin E  400 Units Oral Daily   Continuous Infusions: . sodium chloride     PRN Meds: sodium chloride, acetaminophen **OR** acetaminophen, erythromycin, ondansetron, oxyCODONE-acetaminophen **AND** oxyCODONE, polyethylene glycol, sodium chloride flush, traZODone   Vital Signs    Vitals:   09/03/19 1622 09/03/19 2055 09/04/19 0454 09/04/19 0912  BP: (!) 99/55 (!) 97/50 101/64 (!) 94/50  Pulse: 81 75 75 76  Resp: 18 17 17 18   Temp: (!) 97.4 F (36.3 C) 98.5 F (36.9 C) 97.6 F (36.4 C) 97.7 F (36.5 C)  TempSrc: Oral  Oral Oral  SpO2: 100% 96% 97% 98%  Weight:  74.1 kg    Height:        Intake/Output Summary (Last 24 hours) at 09/04/2019 0917 Last data filed at 09/04/2019 0600 Gross per 24 hour  Intake 720 ml  Output 0 ml  Net 720 ml   Last 3 Weights 09/03/2019 09/02/2019 09/02/2019  Weight (lbs) 163 lb 6 oz 163 lb 5.8 oz 163 lb 5.8 oz  Weight (kg) 74.105 kg 74.1 kg 74.1 kg      Telemetry    NSR - Personally Reviewed  ECG    n/a - Personally Reviewed  Physical Exam   GEN: No acute distress.   Neck: No JVD Cardiac: RRR, no murmurs, rubs, or  gallops.  Respiratory: Clear to auscultation bilaterally. GI: Soft, nontender, non-distended  MS: No edema; No deformity. Neuro:  Nonfocal  Psych: Normal affect   Labs    High Sensitivity Troponin:   Recent Labs  Lab 09/01/19 1308 09/01/19 1428 09/03/19 1115  TROPONINIHS 2,381* 2,709* 2,321*      Chemistry Recent Labs  Lab 09/01/19 1308 09/01/19 1308 09/02/19 0441 09/03/19 0520 09/04/19 0449  NA 139   < > 136 138 135  K 4.5   < > 4.7 4.4 4.5  CL 99   < > 97* 98 98  CO2 30   < > 27 30 23   GLUCOSE 103*   < > 92 91 82  BUN 26*   < > 30* 17 28*  CREATININE 4.97*   < > 6.01* 4.16* 5.51*  CALCIUM 8.2*   < > 8.1* 7.6* 7.6*  PROT 6.0*  --   --   --   --   ALBUMIN 2.5*  --   --  2.0* 2.0*  AST 36  --   --   --   --   ALT 12  --   --   --   --  ALKPHOS 60  --   --   --   --   BILITOT 0.7  --   --   --   --   GFRNONAA 11*   < > 8* 13* 9*  GFRAA 12*   < > 10* 15* 11*  ANIONGAP 10   < > 12 10 14    < > = values in this interval not displayed.     Hematology Recent Labs  Lab 09/02/19 0441 09/02/19 0441 09/02/19 1839 09/03/19 0520 09/04/19 0449  WBC 6.8  --   --  6.3 6.8  RBC 2.41*  --   --  2.98* 3.08*  HGB 7.0*   < > 8.9* 8.7* 9.2*  HCT 22.6*   < > 27.6* 27.1* 28.5*  MCV 93.8  --   --  90.9 92.5  MCH 29.0  --   --  29.2 29.9  MCHC 31.0  --   --  32.1 32.3  RDW 18.7*  --   --  18.7* 18.7*  PLT 187  --   --  158 149*   < > = values in this interval not displayed.    BNPNo results for input(s): BNP, PROBNP in the last 168 hours.   DDimer No results for input(s): DDIMER in the last 168 hours.   Radiology    No results found.  Cardiac Studies    Patient Profile     [name]. is a 74 y.o. male with a hx of coronary artery disease s/p BMS to RCA in 2007 (negative Myoview in 2010), PVD (occluded abd Aorta >> s/p Ax-Fem bypass), carotid artery disease, ESRD on dialysis, prior UGI bleed, prior DVT, atrial fibrillation, chronic anticoagulation with  Apixaban, COPD, hypertension, hyperlipidemia, diastolic CHF who is being seen today for the evaluation of an elevated Troponin at the request of Dr. Denton Brick.  Assessment & Plan    1. Elevated troponin/History of CAD with prior BMS to RCA in 2017 - presented with progressing SOB, no chest pain - up to 2700 without clear peak. - admitted with significant anemia, Hgb 6.9. Today stable Hb at 9.2. - echo this admit LVEF 50-55%, akinesis inferolateral wall. Grade II DDX. Mod MR, mild AI - Jan 2020 echo 60-65%, normal WM  - limited management options in setting of severe anemia - would work to optimize Hgb to 9-10 in setting of ischemia - no anticoag. He is on atorva 80, metoprolol 12.5mg  bid,   - elevated troponin with relative drop in LVEF and new WMA (prior  studies compared, significant change most notable in apical long axis view). Management complicated by significant anemia, though no signs of active bleeding. May have been gradual decline related to his chronic anemia, he was 8.1 in Jan 2021.  - We will arrange for a cardiac cath today.   2. Anemia - baselien Hgb 8-9, admitted at 6.9 - FOBT is negative. There has not been active signs of bleeding - given 1 unit pRBCs, Hgb up to 9.2 - follow trend.   3. PAF - eliquis on hold in setting of severe anemia - hold for now and follow H&H and cath eligibility.   4. ESRD - HD per nephrology  For questions or updates, please contact [place] Please consult [email or web address] for contact info under        Signed, Ena Dawley, MD  09/04/2019, 9:17 AM

## 2019-09-04 NOTE — Progress Notes (Signed)
Patient's CBG 61 gave dextrose and now CBG 148. Patient is NPO

## 2019-09-05 ENCOUNTER — Inpatient Hospital Stay (HOSPITAL_COMMUNITY)
Admission: EM | Disposition: A | Discharge status: Home Health | Payer: Self-pay | Source: Home / Self Care | Attending: Internal Medicine

## 2019-09-05 DIAGNOSIS — I2510 Atherosclerotic heart disease of native coronary artery without angina pectoris: Secondary | ICD-10-CM

## 2019-09-05 DIAGNOSIS — E875 Hyperkalemia: Secondary | ICD-10-CM

## 2019-09-05 DIAGNOSIS — I214 Non-ST elevation (NSTEMI) myocardial infarction: Secondary | ICD-10-CM

## 2019-09-05 DIAGNOSIS — Z992 Dependence on renal dialysis: Secondary | ICD-10-CM

## 2019-09-05 DIAGNOSIS — I10 Essential (primary) hypertension: Secondary | ICD-10-CM

## 2019-09-05 DIAGNOSIS — N186 End stage renal disease: Secondary | ICD-10-CM

## 2019-09-05 HISTORY — PX: LEFT HEART CATH AND CORONARY ANGIOGRAPHY: CATH118249

## 2019-09-05 LAB — RENAL FUNCTION PANEL
Albumin: 2.1 g/dL — ABNORMAL LOW (ref 3.5–5.0)
Anion gap: 15 (ref 5–15)
BUN: 39 mg/dL — ABNORMAL HIGH (ref 8–23)
CO2: 26 mmol/L (ref 22–32)
Calcium: 7.9 mg/dL — ABNORMAL LOW (ref 8.9–10.3)
Chloride: 96 mmol/L — ABNORMAL LOW (ref 98–111)
Creatinine, Ser: 7.42 mg/dL — ABNORMAL HIGH (ref 0.61–1.24)
GFR calc Af Amer: 8 mL/min — ABNORMAL LOW (ref >60)
GFR calc non Af Amer: 6 mL/min — ABNORMAL LOW (ref >60)
Glucose, Bld: 83 mg/dL (ref 70–99)
Phosphorus: 8.2 mg/dL — ABNORMAL HIGH (ref 2.5–4.6)
Potassium: 5.7 mmol/L — ABNORMAL HIGH (ref 3.5–5.1)
Sodium: 137 mmol/L (ref 135–145)

## 2019-09-05 LAB — CBC WITH DIFFERENTIAL/PLATELET
Abs Immature Granulocytes: 0.02 10*3/uL (ref 0.00–0.07)
Basophils Absolute: 0 10*3/uL (ref 0.0–0.1)
Basophils Relative: 0 %
Eosinophils Absolute: 0.2 10*3/uL (ref 0.0–0.5)
Eosinophils Relative: 2 %
HCT: 28.2 % — ABNORMAL LOW (ref 39.0–52.0)
Hemoglobin: 8.9 g/dL — ABNORMAL LOW (ref 13.0–17.0)
Immature Granulocytes: 0 %
Lymphocytes Relative: 34 %
Lymphs Abs: 2.5 10*3/uL (ref 0.7–4.0)
MCH: 28.6 pg (ref 26.0–34.0)
MCHC: 31.6 g/dL (ref 30.0–36.0)
MCV: 90.7 fL (ref 80.0–100.0)
Monocytes Absolute: 0.6 10*3/uL (ref 0.1–1.0)
Monocytes Relative: 7 %
Neutro Abs: 4.3 10*3/uL (ref 1.7–7.7)
Neutrophils Relative %: 57 %
Platelets: 168 10*3/uL (ref 150–400)
RBC: 3.11 MIL/uL — ABNORMAL LOW (ref 4.22–5.81)
RDW: 18.6 % — ABNORMAL HIGH (ref 11.5–15.5)
WBC: 7.6 10*3/uL (ref 4.0–10.5)
nRBC: 0 % (ref 0.0–0.2)

## 2019-09-05 LAB — GLUCOSE, CAPILLARY
Glucose-Capillary: 74 mg/dL (ref 70–99)
Glucose-Capillary: 85 mg/dL (ref 70–99)
Glucose-Capillary: 71 mg/dL (ref 70–99)
Glucose-Capillary: 76 mg/dL (ref 70–99)
Glucose-Capillary: 62 mg/dL — ABNORMAL LOW (ref 70–99)
Glucose-Capillary: 99 mg/dL (ref 70–99)

## 2019-09-05 LAB — POTASSIUM: Potassium: 5.8 mmol/L — ABNORMAL HIGH (ref 3.5–5.1)

## 2019-09-05 SURGERY — LEFT HEART CATH AND CORONARY ANGIOGRAPHY
Anesthesia: LOCAL

## 2019-09-05 MED ORDER — SODIUM CHLORIDE 0.9 % IV SOLN
INTRAVENOUS | Status: DC | PRN
  Administered 2019-09-05: 0.25 mg/kg/h via INTRAVENOUS

## 2019-09-05 MED ORDER — SODIUM CHLORIDE 0.9 % IV SOLN: INTRAVENOUS | Status: DC

## 2019-09-05 MED ORDER — DEXTROSE 50 % IV SOLN
INTRAVENOUS | Status: AC
  Administered 2019-09-05: 25 mL via INTRAVENOUS
  Filled 2019-09-05: qty 50

## 2019-09-05 MED ORDER — SODIUM CHLORIDE 0.9% FLUSH: 3.0000 mL | INTRAVENOUS | Status: DC | PRN

## 2019-09-05 MED ORDER — ASPIRIN 81 MG PO CHEW
CHEWABLE_TABLET | ORAL | Status: AC
  Filled 2019-09-05: qty 1

## 2019-09-05 MED ORDER — IOHEXOL 350 MG/ML SOLN
INTRAVENOUS | Status: DC | PRN
  Administered 2019-09-05: 60 mL via INTRACARDIAC

## 2019-09-05 MED ORDER — SODIUM CHLORIDE 0.9 % IV SOLN
INTRAVENOUS | Status: AC | PRN
  Administered 2019-09-05: 250 mL via INTRAVENOUS
  Administered 2019-09-05: 10 mL/h via INTRAVENOUS

## 2019-09-05 MED ORDER — LIDOCAINE HCL (PF) 1 % IJ SOLN
INTRAMUSCULAR | Status: AC
  Filled 2019-09-05: qty 30

## 2019-09-05 MED ORDER — SODIUM CHLORIDE 0.9% FLUSH
3.0000 mL | Freq: Two times a day (BID) | INTRAVENOUS | Status: DC
  Administered 2019-09-07: 3 mL via INTRAVENOUS

## 2019-09-05 MED ORDER — SODIUM CHLORIDE 0.9 % IV SOLN: 250.0000 mL | INTRAVENOUS | Status: DC | PRN

## 2019-09-05 MED ORDER — CLOPIDOGREL BISULFATE 75 MG PO TABS
75.0000 mg | ORAL_TABLET | Freq: Every day | ORAL | Status: DC
  Administered 2019-09-06 – 2019-09-07 (×2): 75 mg via ORAL
  Filled 2019-09-05 (×2): qty 1

## 2019-09-05 MED ORDER — DEXTROSE 50 % IV SOLN: 12.5000 g | INTRAVENOUS | Status: AC

## 2019-09-05 MED ORDER — ASPIRIN 81 MG PO CHEW: 81.0000 mg | CHEWABLE_TABLET | ORAL | Status: DC

## 2019-09-05 MED ORDER — VERAPAMIL HCL 2.5 MG/ML IV SOLN
INTRAVENOUS | Status: DC | PRN
  Administered 2019-09-05: 10 mL via INTRA_ARTERIAL

## 2019-09-05 MED ORDER — BIVALIRUDIN TRIFLUOROACETATE 250 MG IV SOLR
INTRAVENOUS | Status: AC
  Filled 2019-09-05: qty 250

## 2019-09-05 MED ORDER — LIDOCAINE HCL (PF) 1 % IJ SOLN
INTRAMUSCULAR | Status: DC | PRN
  Administered 2019-09-05: 2 mL

## 2019-09-05 MED ORDER — CLOPIDOGREL BISULFATE 75 MG PO TABS
300.0000 mg | ORAL_TABLET | Freq: Once | ORAL | Status: AC
  Administered 2019-09-05: 300 mg via ORAL
  Filled 2019-09-05: qty 4

## 2019-09-05 MED ORDER — SODIUM CHLORIDE 0.9% FLUSH: 3.0000 mL | Freq: Two times a day (BID) | INTRAVENOUS | Status: DC

## 2019-09-05 MED ORDER — VERAPAMIL HCL 2.5 MG/ML IV SOLN
INTRAVENOUS | Status: AC
  Filled 2019-09-05: qty 2

## 2019-09-05 MED ORDER — BIVALIRUDIN BOLUS VIA INFUSION - CUPID
INTRAVENOUS | Status: DC | PRN
  Administered 2019-09-05: 54.45 mg via INTRAVENOUS

## 2019-09-05 MED ORDER — CALCIUM ACETATE (PHOS BINDER) 667 MG PO CAPS
667.0000 mg | ORAL_CAPSULE | Freq: Three times a day (TID) | ORAL | Status: DC
  Administered 2019-09-06 – 2019-09-07 (×3): 667 mg via ORAL
  Filled 2019-09-05 (×3): qty 1

## 2019-09-05 MED ORDER — DEXTROSE 50 % IV SOLN
INTRAVENOUS | Status: AC
  Administered 2019-09-05: 25 mL
  Filled 2019-09-05: qty 50

## 2019-09-05 MED ORDER — ASPIRIN 81 MG PO CHEW
CHEWABLE_TABLET | ORAL | Status: DC | PRN
  Administered 2019-09-05: 81 mg via ORAL

## 2019-09-05 MED ORDER — HEPARIN (PORCINE) IN NACL 1000-0.9 UT/500ML-% IV SOLN
INTRAVENOUS | Status: AC
  Filled 2019-09-05: qty 1000

## 2019-09-05 MED ORDER — ONDANSETRON HCL 4 MG/2ML IJ SOLN: 4.0000 mg | Freq: Four times a day (QID) | INTRAMUSCULAR | Status: DC | PRN

## 2019-09-05 MED ORDER — ASPIRIN 81 MG PO CHEW
81.0000 mg | CHEWABLE_TABLET | ORAL | Status: AC
  Administered 2019-09-05: 81 mg via ORAL
  Filled 2019-09-05: qty 1

## 2019-09-05 MED ORDER — SODIUM CHLORIDE 0.9 % IV SOLN
INTRAVENOUS | Status: AC | PRN
  Administered 2019-09-05: 10 mL/h via INTRAVENOUS

## 2019-09-05 SURGICAL SUPPLY — 10 items
CATH OPTITORQUE TIG 4.0 5F (CATHETERS) ×1 IMPLANT
DEVICE RAD COMP TR BAND LRG (VASCULAR PRODUCTS) ×1 IMPLANT
GLIDESHEATH SLEND SS 6F .021 (SHEATH) ×1 IMPLANT
GUIDEWIRE INQWIRE 1.5J.035X260 (WIRE) IMPLANT
INQWIRE 1.5J .035X260CM (WIRE) ×2
KIT HEART LEFT (KITS) ×2 IMPLANT
PACK CARDIAC CATHETERIZATION (CUSTOM PROCEDURE TRAY) ×2 IMPLANT
SHEATH PROBE COVER 6X72 (BAG) ×1 IMPLANT
TRANSDUCER W/STOPCOCK (MISCELLANEOUS) ×2 IMPLANT
TUBING CIL FLEX 10 FLL-RA (TUBING) ×2 IMPLANT

## 2019-09-05 NOTE — Progress Notes (Signed)
Patient CBG checked after tx completed, per request of primary nurse. Pt's result-62, pt presently NPO. Dr. Jonnie Finner on unit and made aware. Hypoglycemic protocol initiated, pt given 12.5 g of D50 IV push. Cath lab notified of pt's hypoglycemic episode and reasoning behind delay of transfer to cath lab. Pt sent to cath lab per request of nurse, and will have IV fluids hung with dextrose during procedure. Pt asymptomatic, alert and verbally responsive.

## 2019-09-05 NOTE — Interval H&P Note (Signed)
History and Physical Interval Note:  09/05/2019 4:59 PM  [name].  has presented today for surgery, with the diagnosis of n stemi - + Troponin with reduced LVEF The various methods of treatment have been discussed with the patient and family. After consideration of risks, benefits and other options for treatment, the patient has consented to  Procedure(s): LEFT HEART CATH AND CORONARY ANGIOGRAPHY (N/A) as a surgical intervention.  The patient's history has been reviewed, patient examined, no change in status, stable for surgery.  I have reviewed the patient's chart and labs.  Questions were answered to the patient's satisfaction.     Glenetta Hew

## 2019-09-05 NOTE — Progress Notes (Signed)
Pt demonstrates a tolerance for bed ex's but due to issues with edema and tightness on LUE could not assist with bed mob to scoot up in bed.  Repositioned arm on a pillow and will reassess his mobility next session.   09/05/19 1500  PT Visit Information  Last PT Received On 09/05/19  Assistance Needed +1  History of Present Illness Fallon Howerter  is a 74 y.o. male  with a history of ESRD on dialysis T/Th/Sat,HTN, CAD, COPD, interstitial lung disease, inoperable hernia, bladder cancer (2009), T2DM,  L eye enucleation; afib, & prior VTE who underwent Lt UE AV fistula repair on 08/30/2019. Now presenting with increased left upper arm swelling and bruising, shortness of breath and found to have hemoglobin of 6.9 and elevated troponins.  Subjective Data  Subjective declines OOB due to fatigue  Patient Stated Goal return home  Precautions  Precautions Fall  Precaution Comments has hernia that will come out with increased standing, has hernia belt in room  Restrictions  Weight Bearing Restrictions No  Pain Assessment  Pain Assessment Faces  Faces Pain Scale 4  Pain Location LUE   Pain Descriptors / Indicators Pressure  Pain Intervention(s) Repositioned;Monitored during session  Cognition  Arousal/Alertness Awake/alert  Behavior During Therapy WFL for tasks assessed/performed  Overall Cognitive Status Within Functional Limits for tasks assessed  Bed Mobility  Overal bed mobility Needs Assistance  Bed Mobility  (scooting up in bed)  General bed mobility comments max assist with trendelenburg  Transfers  General transfer comment declined to attempt  Balance  Overall balance assessment Needs assistance  General Comments  General comments (skin integrity, edema, etc.) pt was seen for ther ex as he is too tired for mobility, did ther ex with [facility]  Exercises  Exercises General Lower Extremity  General Exercises - Lower Extremity  Ankle Circles/Pumps AAROM;5 reps  Quad Sets AROM;10 reps   Gluteal Sets AROM;10 reps  Heel Slides AROM;10 reps  Hip ABduction/ADduction AROM;AAROM;10 reps  Straight Leg Raises AAROM;10 reps  Hip Flexion/Marching AAROM;10 reps  PT - End of Session  Activity Tolerance Patient limited by fatigue  Patient left in bed;with call bell/phone within reach;with bed alarm set  Nurse Communication Mobility status   PT - Assessment/Plan  PT Plan Current plan remains appropriate  PT Visit Diagnosis Unsteadiness on feet (R26.81);Other abnormalities of gait and mobility (R26.89);Muscle weakness (generalized) (M62.81)  PT Frequency (ACUTE ONLY) Min 3X/week  Follow Up Recommendations Home health PT  PT equipment Other (comment)  AM-PAC PT "6 Clicks" Mobility Outcome Measure (Version 2)  Help needed turning from your back to your side while in a flat bed without using bedrails? 4  Help needed moving from lying on your back to sitting on the side of a flat bed without using bedrails? 4  Help needed moving to and from a bed to a chair (including a wheelchair)? 3  Help needed standing up from a chair using your arms (e.g., wheelchair or bedside chair)? 3  Help needed to walk in hospital room? 2  Help needed climbing 3-5 steps with a railing?  2  6 Click Score 18  Consider Recommendation of Discharge To: Home with [facility]  PT Time Calculation  PT Start Time (ACUTE ONLY) 1148  PT Stop Time (ACUTE ONLY) 1207  PT Time Calculation (min) (ACUTE ONLY) 19 min  PT General Charges  $$ ACUTE PT VISIT 1 Visit  PT Treatments  $Therapeutic Exercise 8-22 mins    Mee Hives, PT MS Acute  Rehab Dept. Number: [place] and [place]

## 2019-09-05 NOTE — Progress Notes (Signed)
Progress Note  Patient Name: [name]. Date of Encounter: 09/05/2019  Primary Cardiologist: Dorris Carnes, MD   Subjective   He continues to have chest pressure.   Inpatient Medications    Scheduled Meds: . aspirin EC  81 mg Oral Daily  . atorvastatin  80 mg Oral QHS  . calcitRIOL  0.25 mcg Oral Q T,Th,Sa-HD  . Chlorhexidine Gluconate Cloth  6 each Topical Q0600  . ezetimibe  10 mg Oral Daily  . insulin aspart  0-5 Units Subcutaneous QHS  . insulin aspart  0-6 Units Subcutaneous TID WC  . metoprolol tartrate  12.5 mg Oral BID  . midodrine  10 mg Oral TID WC  . multivitamin  1 tablet Oral QHS  . nicotine  7 mg Transdermal Daily  . sodium chloride flush  3 mL Intravenous Q12H  . sodium chloride flush  3 mL Intravenous Q12H  . tiZANidine  2 mg Oral QHS  . vitamin E  400 Units Oral Daily   Continuous Infusions: . sodium chloride    . sodium chloride    . sodium chloride 10 mL/hr at 09/05/19 0629   PRN Meds: sodium chloride, sodium chloride, acetaminophen **OR** acetaminophen, erythromycin, ondansetron, oxyCODONE-acetaminophen **AND** oxyCODONE, polyethylene glycol, sodium chloride flush, sodium chloride flush, traZODone   Vital Signs    Vitals:   09/04/19 2125 09/05/19 0501 09/05/19 0547 09/05/19 0557  BP: (!) 110/58  (!) 85/53 (!) 102/56  Pulse:   66 74  Resp: 17  16   Temp: 97.9 F (36.6 C)  97.8 F (36.6 C)   TempSrc: Oral  Oral   SpO2: 98%  99%   Weight:  76.8 kg    Height:        Intake/Output Summary (Last 24 hours) at 09/05/2019 0840 Last data filed at 09/05/2019 0824 Gross per 24 hour  Intake 360 ml  Output 400 ml  Net -40 ml   Last 3 Weights 09/05/2019 09/03/2019 09/02/2019  Weight (lbs) 169 lb 5 oz 163 lb 6 oz 163 lb 5.8 oz  Weight (kg) 76.8 kg 74.105 kg 74.1 kg     Telemetry    NSR - Personally Reviewed  ECG    n/a - Personally Reviewed  Physical Exam   GEN: No acute distress.   Neck: No JVD Cardiac: RRR, no murmurs, rubs, or gallops.    Respiratory: Clear to auscultation bilaterally. GI: Soft, nontender, non-distended  MS: No edema; No deformity. Neuro:  Nonfocal  Psych: Normal affect   Labs    High Sensitivity Troponin:   Recent Labs  Lab 09/01/19 1308 09/01/19 1428 09/03/19 1115  TROPONINIHS 2,381* 2,709* 2,321*     Chemistry Recent Labs  Lab 09/01/19 1308 09/02/19 0441 09/03/19 0520 09/04/19 0449 09/05/19 0458  NA 139   < > 138 135 137  K 4.5   < > 4.4 4.5 5.7*  CL 99   < > 98 98 96*  CO2 30   < > 30 23 26   GLUCOSE 103*   < > 91 82 83  BUN 26*   < > 17 28* 39*  CREATININE 4.97*   < > 4.16* 5.51* 7.42*  CALCIUM 8.2*   < > 7.6* 7.6* 7.9*  PROT 6.0*  --   --   --   --   ALBUMIN 2.5*  --  2.0* 2.0* 2.1*  AST 36  --   --   --   --   ALT 12  --   --   --   --  ALKPHOS 60  --   --   --   --   BILITOT 0.7  --   --   --   --   GFRNONAA 11*   < > 13* 9* 6*  GFRAA 12*   < > 15* 11* 8*  ANIONGAP 10   < > 10 14 15    < > = values in this interval not displayed.    Hematology Recent Labs  Lab 09/03/19 0520 09/04/19 0449 09/05/19 0458  WBC 6.3 6.8 7.6  RBC 2.98* 3.08* 3.11*  HGB 8.7* 9.2* 8.9*  HCT 27.1* 28.5* 28.2*  MCV 90.9 92.5 90.7  MCH 29.2 29.9 28.6  MCHC 32.1 32.3 31.6  RDW 18.7* 18.7* 18.6*  PLT 158 149* 168   BNPNo results for input(s): BNP, PROBNP in the last 168 hours.   DDimer No results for input(s): DDIMER in the last 168 hours.   Radiology    No results found.  Cardiac Studies    Patient Profile     [name]. is a 74 y.o. male with a hx of coronary artery disease s/p BMS to RCA in 2007 (negative Myoview in 2010), PVD (occluded abd Aorta >> s/p Ax-Fem bypass), carotid artery disease, ESRD on dialysis, prior UGI bleed, prior DVT, atrial fibrillation, chronic anticoagulation with Apixaban, COPD, hypertension, hyperlipidemia, diastolic CHF who is being seen today for the evaluation of an elevated Troponin at the request of Dr. Denton Brick.  Assessment & Plan    1.  Elevated troponin/History of CAD with prior BMS to RCA in 2017 - presented with progressing SOB, no chest pain - up to 2700 without clear peak. - admitted with significant anemia, Hgb 6.9. Today stable Hb at 9.2. - echo this admit LVEF 50-55%, akinesis inferolateral wall. Grade II DDX. Mod MR, mild AI - Jan 2020 echo 60-65%, normal WM  - limited management options in setting of severe anemia - would work to optimize Hgb to 9-10 in setting of ischemia - no anticoag. He is on atorva 80, metoprolol 12.5mg  bid,   - elevated troponin with relative drop in LVEF and new WMA (prior  studies compared, significant change most notable in apical long axis view). Management complicated by significant anemia, though no signs of active bleeding. May have been gradual decline related to his chronic anemia, he was 8.1 in Jan 2021.  - The patient has hyperkalemia 5.7 today, repeat was 5.8, we will arrange for hemodialysis now followed by a cath later today.    2. Anemia - baseline Hgb 8-9, admitted at 6.9 - FOBT is negative. There has not been active signs of bleeding - given 1 unit pRBCs, Hgb up to 8.9 - follow trend.   3. PAF - eliquis on hold in setting of severe anemia - hold for now and follow H&H and cath eligibility.   4. ESRD - HD per nephrology  For questions or updates, please contact [place] Please consult [email or web address] for contact info under     Signed, Ena Dawley, MD  09/05/2019, 8:40 AM

## 2019-09-05 NOTE — Progress Notes (Signed)
OT Cancellation Note  Patient Details Name: [name]. MRN: [id number] DOB: 03/02/1946   Cancelled Treatment:    Reason Eval/Treat Not Completed: Patient at procedure or test/ unavailable. Pt at HD, then going to cath lab. Will try back next available/appropriate time  Britt Bottom 09/05/2019, 12:25 PM

## 2019-09-05 NOTE — Progress Notes (Signed)
Patient's step-daughter [name] was called and updated.

## 2019-09-05 NOTE — Progress Notes (Signed)
PROGRESS NOTE  [name]. FXT:[id number] DOB: 1945/12/14 DOA: 09/01/2019 PCP: [name] Frizzle, MD  HPI/Recap of past 24 hours: HPI done by Dr [name]  is a 74 y.o. male with a history of ESRD on dialysisT/Th/Sat,HTN, CAD, COPD, interstitial lung disease, bladder cancer (2009), T2DM, L eye enucleation; afib,&prior VTE who underwent Lt UE AV fistula repair on 08/30/2019. Last HD session was 08/31/19 apparently took at least 4 attempts to cannulate his AV fistula during HD session---now presenting with increased left upper arm swelling and bruising, shortness of breath and found to have hemoglobin of 6.9 from a baseline around 8 back in January 2021, stool Hemoccult negative. In the ED, pt noted to have elevated troponins above 2700 without acute chest pains or acute EKG changes. EDP discussed case with cardiology at [facility], Cardiologist requested transfer to [facility] for further evaluation as there is no cardiology coverage at [facility] over the weekend.     Today, patient feeling very frustrated, as he has not been able to eat for the past day or 2, reports generalized fatigue, some shortness of breath and chest pressure.  Patient denies any significant abdominal pain, nausea/vomiting, fever/chills.  Plan to have dialysis prior to cardiac cath today.     Assessment/Plan: Principal Problem:   Symptomatic anemia Active Problems:   Essential hypertension   CAD, NATIVE VESSEL   PVD   COPD (chronic obstructive pulmonary disease) ([name])   Type II diabetes mellitus with renal manifestations ([name])   CAD (coronary artery disease)   ESRD on dialysis ([name])   Elevated troponin   ILD (interstitial lung disease) ([name])   Normocytic anemia   Atrial fibrillation ([name])   H/o CAD with elevated troponin Currently chest pain-free Troponin 2,381-->2,709-->2,321 (in the setting of ESRD) EKG with no acute ST changes Echo done 06/2018 showed EF of 60 to 65%, repeat  Echo showed akinesis of the inferolateral wall, EF 50-55%, RWMA, grade II DD Cardiology on board, plan for cath on 09/05/2019 Continue statin, metoprolol Telemetry  Acute on chronic anemia of CKD Hemoglobin 6.9 on presentation, baseline hemoglobin usually above 8 Stool Hemoccult is negative Anemia panel showed iron 30, TIBC 230, sats 13, ferritin 153, folate 9.1 Transfused 1 unit of PRBC during HD on 09/02/19 Nephrology on board Eliquis held by cardiology Daily CBC  Left upper extremity swelling/hematoma Dr. Monica [name] from vascular surgery on board, advised fistula can still be used by sticking above the hematoma, conservative measure such as elevation, resting recommended Elevate arm, monitor closely  Volume overload in an ESRD patient/hyperkalemia Patient on TTS HD, last HD session was 09/05/19  Nephrology on board Daily renal panel  Acute on chronic respiratory failure with hypoxia Likely 2/2 above CTA chest without PE, noted bilateral effusion DuoNebs as needed Continue supplemental oxygen as needed  Paroxysmal Afib Currently rate controlled, in sinus rhythm Continue metoprolol Eliquis held by cardiology due to anemia in the setting of possible ischemia Telemetry  Hypertension BP soft Start midodrine as per nephrology Dr Carolin [name]  Monitor closely  DM type 2 A1c 4.4, in the setting of ESRD  Noted hypoglycemic episodes due to NPO status, D50 prn Very sensitive SSI, Accu-Cheks, hypoglycemic protocol Hold home [place]  COPD Continue duo nebs as needed  Rt Lung Nodule 1.3 cm x 1.1 cm noncalcified lung nodule within the posterolateral aspect of the right middle lobe Patient advised to follow-up for outpatient PET CT within the next month  Chronic pain Continue home  opiate regimen--percocets  and Zanaflex       Malnutrition Type:      Malnutrition Characteristics:      Nutrition Interventions:       Estimated body mass index is 23.95 kg/m  as calculated from the following:   Height as of this encounter: 5' 10.5" (1.791 m).   Weight as of this encounter: 76.8 kg.     Code Status: Partial code/DNI  Family Communication: Attempted to reach daughter [name], no answer  Disposition Plan: Pending further work up, cardiac cath, pending consultant signing off, clinical improvement, PT/OT   Consultants: Nephrology Cardiology Vascular surgery  Procedures:  None  Antimicrobials:  None  DVT prophylaxis: SCD, Eliquis held due to anemia   Objective: Vitals:   09/05/19 1430 09/05/19 1500 09/05/19 1530 09/05/19 1600  BP: (!) 85/44 (!) 95/49 (!) 90/45 126/87  Pulse: 66 (!) 58 67 (!) 54  Resp:      Temp:      TempSrc:      SpO2:      Weight:      Height:        Intake/Output Summary (Last 24 hours) at 09/05/2019 1655 Last data filed at 09/05/2019 1317 Gross per 24 hour  Intake 360 ml  Output 0 ml  Net 360 ml   Filed Weights   09/03/19 2055 09/05/19 0501 09/05/19 1220  Weight: 74.1 kg 76.8 kg 76.8 kg    Exam:  General: NAD, HOH  Cardiovascular: S1, S2 present  Respiratory:  Diminished breath sounds bilaterally, mild bibasilar crackles noted  Abdomen: Soft, nontender, nondistended, bowel sounds present  Musculoskeletal: Trace bilateral pedal edema noted, left upper extremity with fistula noted with positive bruit, less swelling/tenderness/bruising noted  Skin:  As above  Psychiatry: Normal mood   Data Reviewed: CBC: Recent Labs  Lab 09/01/19 1308 09/01/19 1308 09/02/19 0441 09/02/19 1839 09/03/19 0520 09/04/19 0449 09/05/19 0458  WBC 6.1  --  6.8  --  6.3 6.8 7.6  NEUTROABS 4.1  --   --   --  3.4 4.2 4.3  HGB 6.9*   < > 7.0* 8.9* 8.7* 9.2* 8.9*  HCT 22.3*   < > 22.6* 27.6* 27.1* 28.5* 28.2*  MCV 94.5  --  93.8  --  90.9 92.5 90.7  PLT 154  --  187  --  158 149* 168   < > = values in this interval not displayed.   Basic Metabolic Panel: Recent Labs  Lab 09/01/19 1308 09/01/19 1308  09/02/19 0441 09/03/19 0520 09/04/19 0449 09/05/19 0458 09/05/19 0839  NA 139  --  136 138 135 137  --   K 4.5   < > 4.7 4.4 4.5 5.7* 5.8*  CL 99  --  97* 98 98 96*  --   CO2 30  --  27 30 23 26   --   GLUCOSE 103*  --  92 91 82 83  --   BUN 26*  --  30* 17 28* 39*  --   CREATININE 4.97*  --  6.01* 4.16* 5.51* 7.42*  --   CALCIUM 8.2*  --  8.1* 7.6* 7.6* 7.9*  --   PHOS  --   --   --  4.9* 6.6* 8.2*  --    < > = values in this interval not displayed.   GFR: Estimated Creatinine Clearance: 9 mL/min (A) (by C-G formula based on SCr of 7.42 mg/dL (H)). Liver Function Tests: Recent Labs  Lab 09/01/19 1308 09/03/19  2836 09/04/19 0449 09/05/19 0458  AST 36  --   --   --   ALT 12  --   --   --   ALKPHOS 60  --   --   --   BILITOT 0.7  --   --   --   PROT 6.0*  --   --   --   ALBUMIN 2.5* 2.0* 2.0* 2.1*   No results for input(s): LIPASE, AMYLASE in the last 168 hours. No results for input(s): AMMONIA in the last 168 hours. Coagulation Profile: No results for input(s): INR, PROTIME in the last 168 hours. Cardiac Enzymes: No results for input(s): CKTOTAL, CKMB, CKMBINDEX, TROPONINI in the last 168 hours. BNP (last 3 results) No results for input(s): PROBNP in the last 8760 hours. HbA1C: No results for input(s): HGBA1C in the last 72 hours. CBG: Recent Labs  Lab 09/05/19 0558 09/05/19 0700 09/05/19 1052 09/05/19 1426 09/05/19 1628  GLUCAP 74 85 71 76 62*   Lipid Profile: No results for input(s): CHOL, HDL, LDLCALC, TRIG, CHOLHDL, LDLDIRECT in the last 72 hours. Thyroid Function Tests: No results for input(s): TSH, T4TOTAL, FREET4, T3FREE, THYROIDAB in the last 72 hours. Anemia Panel: No results for input(s): VITAMINB12, FOLATE, FERRITIN, TIBC, IRON, RETICCTPCT in the last 72 hours. Urine analysis:    Component Value Date/Time   COLORURINE YELLOW 11/05/2018 0601   APPEARANCEUR CLEAR 11/05/2018 0601   LABSPEC 1.012 11/05/2018 0601   PHURINE 7.0 11/05/2018 0601    GLUCOSEU NEGATIVE 11/05/2018 0601   HGBUR LARGE (A) 11/05/2018 0601   BILIRUBINUR NEGATIVE 11/05/2018 0601   KETONESUR NEGATIVE 11/05/2018 0601   PROTEINUR 100 (A) 11/05/2018 0601   NITRITE NEGATIVE 11/05/2018 0601   LEUKOCYTESUR NEGATIVE 11/05/2018 0601   Sepsis Labs: @LABRCNTIP (procalcitonin:4,lacticidven:4)  ) Recent Results (from the past 240 hour(s))  Respiratory Panel by RT PCR (Flu A&B, Covid) - Nasopharyngeal Swab     Status: None   Collection Time: 09/01/19  5:16 PM   Specimen: Nasopharyngeal Swab  Result Value Ref Range Status   SARS Coronavirus 2 by RT PCR NEGATIVE NEGATIVE Final    Comment: (NOTE) SARS-CoV-2 target nucleic acids are NOT DETECTED. The SARS-CoV-2 RNA is generally detectable in upper respiratoy specimens during the acute phase of infection. The lowest concentration of SARS-CoV-2 viral copies this assay can detect is 131 copies/mL. A negative result does not preclude SARS-Cov-2 infection and should not be used as the sole basis for treatment or other patient management decisions. A negative result may occur with  improper specimen collection/handling, submission of specimen other than nasopharyngeal swab, presence of viral mutation(s) within the areas targeted by this assay, and inadequate number of viral copies (<131 copies/mL). A negative result must be combined with clinical observations, patient history, and epidemiological information. The expected result is Negative. Fact Sheet for Patients:  PinkCheek.be Fact Sheet for Healthcare Providers:  GravelBags.it This test is not yet ap proved or cleared by the [place] FDA and  has been authorized for detection and/or diagnosis of SARS-CoV-2 by FDA under an Emergency Use Authorization (EUA). This EUA will remain  in effect (meaning this test can be used) for the duration of the COVID-19 declaration under Section 564(b)(1) of the Act, 21  U.S.C. section 360bbb-3(b)(1), unless the authorization is terminated or revoked sooner.    Influenza A by PCR NEGATIVE NEGATIVE Final   Influenza B by PCR NEGATIVE NEGATIVE Final    Comment: (NOTE) The Xpert Xpress SARS-CoV-2/FLU/RSV assay is intended as an  aid in  the diagnosis of influenza from Nasopharyngeal swab specimens and  should not be used as a sole basis for treatment. Nasal washings and  aspirates are unacceptable for Xpert Xpress SARS-CoV-2/FLU/RSV  testing. Fact Sheet for Patients: PinkCheek.be Fact Sheet for Healthcare Providers: GravelBags.it This test is not yet approved or cleared by the [place] FDA and  has been authorized for detection and/or diagnosis of SARS-CoV-2 by  FDA under an Emergency Use Authorization (EUA). This EUA will remain  in effect (meaning this test can be used) for the duration of the  Covid-19 declaration under Section 564(b)(1) of the Act, 21  U.S.C. section 360bbb-3(b)(1), unless the authorization is  terminated or revoked. Performed at [facility], [address], [place], [place] [address]   MRSA PCR Screening     Status: None   Collection Time: 09/01/19 11:10 PM   Specimen: Nasal Mucosa; Nasopharyngeal  Result Value Ref Range Status   MRSA by PCR NEGATIVE NEGATIVE Final    Comment:        The GeneXpert MRSA Assay (FDA approved for NASAL specimens only), is one component of a comprehensive MRSA colonization surveillance program. It is not intended to diagnose MRSA infection nor to guide or monitor treatment for MRSA infections. Performed at [facility], [place] [address] [place], [place] [address]       Studies: No results found.  Scheduled Meds: . aspirin EC  81 mg Oral Daily  . atorvastatin  80 mg Oral QHS  . calcitRIOL  0.25 mcg Oral Q T,Th,Sa-HD  . calcium acetate  667 mg Oral TID WC  . Chlorhexidine Gluconate Cloth  6 each Topical Q0600  .  ezetimibe  10 mg Oral Daily  . insulin aspart  0-5 Units Subcutaneous QHS  . insulin aspart  0-6 Units Subcutaneous TID WC  . metoprolol tartrate  12.5 mg Oral BID  . midodrine  10 mg Oral TID WC  . multivitamin  1 tablet Oral QHS  . nicotine  7 mg Transdermal Daily  . sodium chloride flush  3 mL Intravenous Q12H  . sodium chloride flush  3 mL Intravenous Q12H  . tiZANidine  2 mg Oral QHS  . vitamin E  400 Units Oral Daily    Continuous Infusions: . sodium chloride    . sodium chloride    . sodium chloride 10 mL/hr at 09/05/19 0263     LOS: 4 days     Alma Friendly, MD [facility]  If 7PM-7AM, please contact night-coverage [email or web address] 09/05/2019, 4:55 PM

## 2019-09-05 NOTE — Plan of Care (Signed)
  Problem: Activity: Goal: Risk for activity intolerance will decrease Outcome: Progressing   

## 2019-09-05 NOTE — Progress Notes (Signed)
[facility] Progress Note   Subjective:    Patient seen in room, frustrated that he has not been able to eat for the past 2 days. Ongoing chest pressure with exertion. Denies SOB, dizziness, palpitations, N/V/D. Chronic abdominal pain due to hernia, unchanged. Cardiac cath now planned for today but will need HD first due to K+ 5.8>5.8.   Objective Vitals:   09/05/19 0501 09/05/19 0547 09/05/19 0557 09/05/19 0832  BP:  (!) 85/53 (!) 102/56 (!) 103/55  Pulse:  66 74 64  Resp:  16  16  Temp:  97.8 F (36.6 C)  (!) 97.5 F (36.4 C)  TempSrc:  Oral  Oral  SpO2:  99%  100%  Weight: 76.8 kg     Height:       Physical Exam General: Well developed, chronically ill male in NAD Heart: RRR, no murmurs, rubs or gallops Lungs: Lungs CTA without wheezing, rhonchi or rales. On O2 via nasal cannula Abdomen: Soft, non-tender, non-distended. +BS Extremities: No edema b/l lower extremities Dialysis Access: LUE Localized edema/hematoma antecubital region, improving, soft, AVF + thrill/bruit  Additional Objective Labs: Basic Metabolic Panel: Recent Labs  Lab 09/03/19 0520 09/03/19 0520 09/04/19 0449 09/05/19 0458 09/05/19 0839  NA 138  --  135 137  --   K 4.4   < > 4.5 5.7* 5.8*  CL 98  --  98 96*  --   CO2 30  --  23 26  --   GLUCOSE 91  --  82 83  --   BUN 17  --  28* 39*  --   CREATININE 4.16*  --  5.51* 7.42*  --   CALCIUM 7.6*  --  7.6* 7.9*  --   PHOS 4.9*  --  6.6* 8.2*  --    < > = values in this interval not displayed.   Liver Function Tests: Recent Labs  Lab 09/01/19 1308 09/01/19 1308 09/03/19 0520 09/04/19 0449 09/05/19 0458  AST 36  --   --   --   --   ALT 12  --   --   --   --   ALKPHOS 60  --   --   --   --   BILITOT 0.7  --   --   --   --   PROT 6.0*  --   --   --   --   ALBUMIN 2.5*   < > 2.0* 2.0* 2.1*   < > = values in this interval not displayed.   CBC: Recent Labs  Lab 09/01/19 1308 09/01/19 1308 09/02/19 0441 09/02/19 1839  09/03/19 0520 09/04/19 0449 09/05/19 0458  WBC 6.1   < > 6.8  --  6.3 6.8 7.6  NEUTROABS 4.1   < >  --   --  3.4 4.2 4.3  HGB 6.9*   < > 7.0*   < > 8.7* 9.2* 8.9*  HCT 22.3*   < > 22.6*   < > 27.1* 28.5* 28.2*  MCV 94.5  --  93.8  --  90.9 92.5 90.7  PLT 154   < > 187  --  158 149* 168   < > = values in this interval not displayed.   Blood Culture    Component Value Date/Time   SDES URINE, RANDOM 11/05/2018 0602   SPECREQUEST NONE 11/05/2018 0602   CULT  11/05/2018 0602    NO GROWTH Performed at [facility], [place] [address], [place], [place]  [place] 11/06/2018 FINAL 11/05/2018 0602    CBG: Recent Labs  Lab 09/04/19 1630 09/04/19 2129 09/05/19 0558 09/05/19 0700 09/05/19 1052  GLUCAP 136* 112* 74 85 71   Medications: . sodium chloride    . sodium chloride    . sodium chloride 10 mL/hr at 09/05/19 0629   . aspirin EC  81 mg Oral Daily  . atorvastatin  80 mg Oral QHS  . calcitRIOL  0.25 mcg Oral Q T,Th,Sa-HD  . Chlorhexidine Gluconate Cloth  6 each Topical Q0600  . ezetimibe  10 mg Oral Daily  . insulin aspart  0-5 Units Subcutaneous QHS  . insulin aspart  0-6 Units Subcutaneous TID WC  . metoprolol tartrate  12.5 mg Oral BID  . midodrine  10 mg Oral TID WC  . multivitamin  1 tablet Oral QHS  . nicotine  7 mg Transdermal Daily  . sodium chloride flush  3 mL Intravenous Q12H  . sodium chloride flush  3 mL Intravenous Q12H  . tiZANidine  2 mg Oral QHS  . vitamin E  400 Units Oral Daily    [facility], T 3:30 hr, BFR 400/ DFR Auto 1.5, 2K, 2.5Ca, UF Profile 2, AVF Calcitriol 0.25 mcg PO q HD Mircera 50 mcg IV q 2 weeks- last dose 08/29/19 No binder  Assessment/Plan: 1. Shortness of breath: CTA negative for PE. Dyspnea likely multifactorial due to CAD, anemia and CHF with pleural effusions. Improvedafter HD. 2. CAD: Pt with history of angioplasty and stenting. Troponin >2700. Transferred  from [facility] for further cardiology work up.New relative drop in LVEF and WMA, planned for cardiac cath today after HD.  3. Left upper extremity edema:Recent LUE AVF procedure on 3/3. Had difficulty cannulating 3/4 and patient believes needles infiltrated. LUE US showing hematoma. Vascular surgery recommending arm elevation. We were able to cannulate AVF on 3/6. Edema continues to improve daily.  4. ESRD:TTS schedule. K+ 5.7 > 5.8 today, HD next shift. Continue TTS schedule.  5. Hypertension/volume:BP soft,required midodrine. Pleural effusions noted on CTA.On metoprolol for a.fib/ CHF.UF with HD today as BP tolerates.  6. Anemia:Hgb 6.9 >>> 9.2> 8.9. s/p 1 unit PRBC on 3/6.FOBT negative. ESA recently dosed.  7. Metabolic bone disease:Calcium 9.4 corrected, Continue calcitriol.Phos up to 8.2, not on phos binder as outpatient but was on calcium acetate previously, will restart. Follow calcium. 8. Nutrition:Renal diet with fluid restrictions  Anice Paganini, PA-C 09/05/2019, 10:57 AM  [facility] Pager: [phone]

## 2019-09-05 NOTE — H&P (View-Only) (Signed)
Progress Note  Patient Name: [name]. Date of Encounter: 09/05/2019  Primary Cardiologist: Dorris Carnes, MD   Subjective   He continues to have chest pressure.   Inpatient Medications    Scheduled Meds: . aspirin EC  81 mg Oral Daily  . atorvastatin  80 mg Oral QHS  . calcitRIOL  0.25 mcg Oral Q T,Th,Sa-HD  . Chlorhexidine Gluconate Cloth  6 each Topical Q0600  . ezetimibe  10 mg Oral Daily  . insulin aspart  0-5 Units Subcutaneous QHS  . insulin aspart  0-6 Units Subcutaneous TID WC  . metoprolol tartrate  12.5 mg Oral BID  . midodrine  10 mg Oral TID WC  . multivitamin  1 tablet Oral QHS  . nicotine  7 mg Transdermal Daily  . sodium chloride flush  3 mL Intravenous Q12H  . sodium chloride flush  3 mL Intravenous Q12H  . tiZANidine  2 mg Oral QHS  . vitamin E  400 Units Oral Daily   Continuous Infusions: . sodium chloride    . sodium chloride    . sodium chloride 10 mL/hr at 09/05/19 0629   PRN Meds: sodium chloride, sodium chloride, acetaminophen **OR** acetaminophen, erythromycin, ondansetron, oxyCODONE-acetaminophen **AND** oxyCODONE, polyethylene glycol, sodium chloride flush, sodium chloride flush, traZODone   Vital Signs    Vitals:   09/04/19 2125 09/05/19 0501 09/05/19 0547 09/05/19 0557  BP: (!) 110/58  (!) 85/53 (!) 102/56  Pulse:   66 74  Resp: 17  16   Temp: 97.9 F (36.6 C)  97.8 F (36.6 C)   TempSrc: Oral  Oral   SpO2: 98%  99%   Weight:  76.8 kg    Height:        Intake/Output Summary (Last 24 hours) at 09/05/2019 0840 Last data filed at 09/05/2019 0824 Gross per 24 hour  Intake 360 ml  Output 400 ml  Net -40 ml   Last 3 Weights 09/05/2019 09/03/2019 09/02/2019  Weight (lbs) 169 lb 5 oz 163 lb 6 oz 163 lb 5.8 oz  Weight (kg) 76.8 kg 74.105 kg 74.1 kg     Telemetry    NSR - Personally Reviewed  ECG    n/a - Personally Reviewed  Physical Exam   GEN: No acute distress.   Neck: No JVD Cardiac: RRR, no murmurs, rubs, or gallops.    Respiratory: Clear to auscultation bilaterally. GI: Soft, nontender, non-distended  MS: No edema; No deformity. Neuro:  Nonfocal  Psych: Normal affect   Labs    High Sensitivity Troponin:   Recent Labs  Lab 09/01/19 1308 09/01/19 1428 09/03/19 1115  TROPONINIHS 2,381* 2,709* 2,321*     Chemistry Recent Labs  Lab 09/01/19 1308 09/02/19 0441 09/03/19 0520 09/04/19 0449 09/05/19 0458  NA 139   < > 138 135 137  K 4.5   < > 4.4 4.5 5.7*  CL 99   < > 98 98 96*  CO2 30   < > 30 23 26   GLUCOSE 103*   < > 91 82 83  BUN 26*   < > 17 28* 39*  CREATININE 4.97*   < > 4.16* 5.51* 7.42*  CALCIUM 8.2*   < > 7.6* 7.6* 7.9*  PROT 6.0*  --   --   --   --   ALBUMIN 2.5*  --  2.0* 2.0* 2.1*  AST 36  --   --   --   --   ALT 12  --   --   --   --  ALKPHOS 60  --   --   --   --   BILITOT 0.7  --   --   --   --   GFRNONAA 11*   < > 13* 9* 6*  GFRAA 12*   < > 15* 11* 8*  ANIONGAP 10   < > 10 14 15    < > = values in this interval not displayed.    Hematology Recent Labs  Lab 09/03/19 0520 09/04/19 0449 09/05/19 0458  WBC 6.3 6.8 7.6  RBC 2.98* 3.08* 3.11*  HGB 8.7* 9.2* 8.9*  HCT 27.1* 28.5* 28.2*  MCV 90.9 92.5 90.7  MCH 29.2 29.9 28.6  MCHC 32.1 32.3 31.6  RDW 18.7* 18.7* 18.6*  PLT 158 149* 168   BNPNo results for input(s): BNP, PROBNP in the last 168 hours.   DDimer No results for input(s): DDIMER in the last 168 hours.   Radiology    No results found.  Cardiac Studies    Patient Profile     [name]. is a 74 y.o. male with a hx of coronary artery disease s/p BMS to RCA in 2007 (negative Myoview in 2010), PVD (occluded abd Aorta >> s/p Ax-Fem bypass), carotid artery disease, ESRD on dialysis, prior UGI bleed, prior DVT, atrial fibrillation, chronic anticoagulation with Apixaban, COPD, hypertension, hyperlipidemia, diastolic CHF who is being seen today for the evaluation of an elevated Troponin at the request of Dr. Denton Brick.  Assessment & Plan    1.  Elevated troponin/History of CAD with prior BMS to RCA in 2017 - presented with progressing SOB, no chest pain - up to 2700 without clear peak. - admitted with significant anemia, Hgb 6.9. Today stable Hb at 9.2. - echo this admit LVEF 50-55%, akinesis inferolateral wall. Grade II DDX. Mod MR, mild AI - Jan 2020 echo 60-65%, normal WM  - limited management options in setting of severe anemia - would work to optimize Hgb to 9-10 in setting of ischemia - no anticoag. He is on atorva 80, metoprolol 12.5mg  bid,   - elevated troponin with relative drop in LVEF and new WMA (prior  studies compared, significant change most notable in apical long axis view). Management complicated by significant anemia, though no signs of active bleeding. May have been gradual decline related to his chronic anemia, he was 8.1 in Jan 2021.  - The patient has hyperkalemia 5.7 today, repeat was 5.8, we will arrange for hemodialysis now followed by a cath later today.    2. Anemia - baseline Hgb 8-9, admitted at 6.9 - FOBT is negative. There has not been active signs of bleeding - given 1 unit pRBCs, Hgb up to 8.9 - follow trend.   3. PAF - eliquis on hold in setting of severe anemia - hold for now and follow H&H and cath eligibility.   4. ESRD - HD per nephrology  For questions or updates, please contact [place] Please consult [email or web address] for contact info under     Signed, Ena Dawley, MD  09/05/2019, 8:40 AM

## 2019-09-06 ENCOUNTER — Encounter (HOSPITAL_COMMUNITY)
Admission: EM | Disposition: A | Discharge status: Home Health | Payer: Self-pay | Source: Home / Self Care | Attending: Internal Medicine

## 2019-09-06 DIAGNOSIS — I25110 Atherosclerotic heart disease of native coronary artery with unstable angina pectoris: Secondary | ICD-10-CM

## 2019-09-06 DIAGNOSIS — I480 Paroxysmal atrial fibrillation: Secondary | ICD-10-CM

## 2019-09-06 DIAGNOSIS — I214 Non-ST elevation (NSTEMI) myocardial infarction: Secondary | ICD-10-CM

## 2019-09-06 HISTORY — PX: CORONARY ATHERECTOMY: CATH118238

## 2019-09-06 LAB — RENAL FUNCTION PANEL
Albumin: 1.9 g/dL — ABNORMAL LOW (ref 3.5–5.0)
Anion gap: 12 (ref 5–15)
BUN: 20 mg/dL (ref 8–23)
CO2: 28 mmol/L (ref 22–32)
Calcium: 7.7 mg/dL — ABNORMAL LOW (ref 8.9–10.3)
Chloride: 95 mmol/L — ABNORMAL LOW (ref 98–111)
Creatinine, Ser: 4.61 mg/dL — ABNORMAL HIGH (ref 0.61–1.24)
GFR calc Af Amer: 13 mL/min — ABNORMAL LOW (ref >60)
GFR calc non Af Amer: 12 mL/min — ABNORMAL LOW (ref >60)
Glucose, Bld: 100 mg/dL — ABNORMAL HIGH (ref 70–99)
Phosphorus: 5.4 mg/dL — ABNORMAL HIGH (ref 2.5–4.6)
Potassium: 4.7 mmol/L (ref 3.5–5.1)
Sodium: 135 mmol/L (ref 135–145)

## 2019-09-06 LAB — TYPE AND SCREEN
ABO/RH(D): A POS
Antibody Screen: NEGATIVE
Unit division: 0
Unit division: 0
Unit division: 0

## 2019-09-06 LAB — GLUCOSE, CAPILLARY
Glucose-Capillary: 73 mg/dL (ref 70–99)
Glucose-Capillary: 63 mg/dL — ABNORMAL LOW (ref 70–99)
Glucose-Capillary: 97 mg/dL (ref 70–99)
Glucose-Capillary: 112 mg/dL — ABNORMAL HIGH (ref 70–99)

## 2019-09-06 LAB — CBC WITH DIFFERENTIAL/PLATELET
Abs Immature Granulocytes: 0.01 10*3/uL (ref 0.00–0.07)
Basophils Absolute: 0 10*3/uL (ref 0.0–0.1)
Basophils Relative: 0 %
Eosinophils Absolute: 0.1 10*3/uL (ref 0.0–0.5)
Eosinophils Relative: 2 %
HCT: 26.7 % — ABNORMAL LOW (ref 39.0–52.0)
Hemoglobin: 8.5 g/dL — ABNORMAL LOW (ref 13.0–17.0)
Immature Granulocytes: 0 %
Lymphocytes Relative: 35 %
Lymphs Abs: 1.9 10*3/uL (ref 0.7–4.0)
MCH: 28.8 pg (ref 26.0–34.0)
MCHC: 31.8 g/dL (ref 30.0–36.0)
MCV: 90.5 fL (ref 80.0–100.0)
Monocytes Absolute: 0.4 10*3/uL (ref 0.1–1.0)
Monocytes Relative: 8 %
Neutro Abs: 2.9 10*3/uL (ref 1.7–7.7)
Neutrophils Relative %: 55 %
Platelets: 147 10*3/uL — ABNORMAL LOW (ref 150–400)
RBC: 2.95 MIL/uL — ABNORMAL LOW (ref 4.22–5.81)
RDW: 18.4 % — ABNORMAL HIGH (ref 11.5–15.5)
WBC: 5.4 10*3/uL (ref 4.0–10.5)
nRBC: 0 % (ref 0.0–0.2)

## 2019-09-06 LAB — BPAM RBC
Blood Product Expiration Date: 202103292359
Blood Product Expiration Date: 202103302359
Blood Product Expiration Date: 202103302359
ISSUE DATE / TIME: 202103061309
ISSUE DATE / TIME: 202103061309
Unit Type and Rh: 6200
Unit Type and Rh: 6200
Unit Type and Rh: 6200

## 2019-09-06 LAB — POCT ACTIVATED CLOTTING TIME
Activated Clotting Time: 131 seconds
Activated Clotting Time: 136 seconds
Activated Clotting Time: 224 seconds

## 2019-09-06 SURGERY — CORONARY ATHERECTOMY
Anesthesia: LOCAL

## 2019-09-06 MED ORDER — VIPERSLIDE LUBRICANT OPTIME: TOPICAL | Status: DC | PRN

## 2019-09-06 MED ORDER — BIVALIRUDIN BOLUS VIA INFUSION - CUPID
INTRAVENOUS | Status: DC | PRN
  Administered 2019-09-06: 55.275 mg via INTRAVENOUS

## 2019-09-06 MED ORDER — LIDOCAINE HCL (PF) 1 % IJ SOLN
INTRAMUSCULAR | Status: AC
  Filled 2019-09-06: qty 30

## 2019-09-06 MED ORDER — CHLORHEXIDINE GLUCONATE CLOTH 2 % EX PADS: 6.0000 | MEDICATED_PAD | Freq: Every day | CUTANEOUS | Status: DC

## 2019-09-06 MED ORDER — LIDOCAINE HCL (PF) 1 % IJ SOLN
INTRAMUSCULAR | Status: DC | PRN
  Administered 2019-09-06: 2 mL via SUBCUTANEOUS

## 2019-09-06 MED ORDER — VERAPAMIL HCL 2.5 MG/ML IV SOLN
INTRAVENOUS | Status: AC
  Filled 2019-09-06: qty 2

## 2019-09-06 MED ORDER — CLOPIDOGREL BISULFATE 300 MG PO TABS
ORAL_TABLET | ORAL | Status: AC
  Filled 2019-09-06: qty 1

## 2019-09-06 MED ORDER — ASPIRIN 81 MG PO CHEW
CHEWABLE_TABLET | ORAL | Status: AC
  Filled 2019-09-06: qty 1

## 2019-09-06 MED ORDER — BIVALIRUDIN TRIFLUOROACETATE 250 MG IV SOLR
INTRAVENOUS | Status: AC
  Filled 2019-09-06: qty 250

## 2019-09-06 MED ORDER — MIDODRINE HCL 5 MG PO TABS
10.0000 mg | ORAL_TABLET | Freq: Once | ORAL | Status: AC
  Administered 2019-09-06: 10 mg via ORAL
  Filled 2019-09-06: qty 2

## 2019-09-06 MED ORDER — SODIUM CHLORIDE 0.9 % IV SOLN: 250.0000 mL | INTRAVENOUS | Status: DC | PRN

## 2019-09-06 MED ORDER — IOHEXOL 350 MG/ML SOLN
INTRAVENOUS | Status: DC | PRN
  Administered 2019-09-06: 125 mL via INTRA_ARTERIAL

## 2019-09-06 MED ORDER — VERAPAMIL HCL 2.5 MG/ML IV SOLN
INTRAVENOUS | Status: DC | PRN
  Administered 2019-09-06: 10 mL via INTRA_ARTERIAL

## 2019-09-06 MED ORDER — HEPARIN (PORCINE) IN NACL 1000-0.9 UT/500ML-% IV SOLN
INTRAVENOUS | Status: AC
  Filled 2019-09-06: qty 1000

## 2019-09-06 MED ORDER — SODIUM CHLORIDE 0.9% FLUSH: 3.0000 mL | Freq: Two times a day (BID) | INTRAVENOUS | Status: DC

## 2019-09-06 MED ORDER — AMIODARONE HCL 200 MG PO TABS
400.0000 mg | ORAL_TABLET | Freq: Two times a day (BID) | ORAL | Status: DC
  Administered 2019-09-06 – 2019-09-07 (×2): 400 mg via ORAL
  Filled 2019-09-06 (×2): qty 2

## 2019-09-06 MED ORDER — SODIUM CHLORIDE 0.9% FLUSH: 3.0000 mL | INTRAVENOUS | Status: DC | PRN

## 2019-09-06 MED ORDER — CLOPIDOGREL BISULFATE 300 MG PO TABS
ORAL_TABLET | ORAL | Status: DC | PRN
  Administered 2019-09-06: 300 mg via ORAL

## 2019-09-06 MED ORDER — SODIUM CHLORIDE 0.9 % IV SOLN
INTRAVENOUS | Status: DC | PRN
  Administered 2019-09-06: 0.25 mg/kg/h via INTRAVENOUS

## 2019-09-06 MED ORDER — SODIUM CHLORIDE 0.9 % IV BOLUS
250.0000 mL | Freq: Once | INTRAVENOUS | Status: AC
  Administered 2019-09-06: 250 mL via INTRAVENOUS

## 2019-09-06 MED FILL — Heparin Sod (Porcine)-NaCl IV Soln 1000 Unit/500ML-0.9%: INTRAVENOUS | Qty: 1000 | Status: AC

## 2019-09-06 SURGICAL SUPPLY — 25 items
BALLN SAPPHIRE ~~LOC~~ 3.5X15 (BALLOONS) ×1 IMPLANT
BALLN WOLVERINE 2.75X10 (BALLOONS) ×2
BALLOON WOLVERINE 2.75X10 (BALLOONS) IMPLANT
CABLE ADAPT PACING TEMP 12FT (ADAPTER) ×1 IMPLANT
CATH LAUNCHER 6FR AL.75 (CATHETERS) ×1 IMPLANT
CATH LAUNCHER 6FR JR4 (CATHETERS) ×1 IMPLANT
CATH S G BIP PACING (CATHETERS) ×1 IMPLANT
CROWN DIAMONDBACK CLASSIC 1.25 (BURR) ×1 IMPLANT
DEVICE RAD COMP TR BAND LRG (VASCULAR PRODUCTS) ×1 IMPLANT
ELECT DEFIB PAD ADLT CADENCE (PAD) ×1 IMPLANT
GLIDESHEATH SLEND SS 6F .021 (SHEATH) ×1 IMPLANT
GUIDEWIRE INQWIRE 1.5J.035X260 (WIRE) IMPLANT
INQWIRE 1.5J .035X260CM (WIRE) ×2
KIT ENCORE 26 ADVANTAGE (KITS) ×1 IMPLANT
KIT HEART LEFT (KITS) ×2 IMPLANT
KIT MICROPUNCTURE NIT STIFF (SHEATH) ×1 IMPLANT
LUBRICANT VIPERSLIDE CORONARY (MISCELLANEOUS) ×1 IMPLANT
PACK CARDIAC CATHETERIZATION (CUSTOM PROCEDURE TRAY) ×2 IMPLANT
SHEATH PINNACLE 6F 10CM (SHEATH) ×1 IMPLANT
SHEATH PROBE COVER 6X72 (BAG) ×1 IMPLANT
STENT RESOLUTE ONYX 3.5X22 (Permanent Stent) ×1 IMPLANT
TRANSDUCER W/STOPCOCK (MISCELLANEOUS) ×2 IMPLANT
TUBING CIL FLEX 10 FLL-RA (TUBING) ×2 IMPLANT
WIRE RUNTHROUGH .014X180CM (WIRE) ×1 IMPLANT
WIRE VIPERWIRE COR FLEX .012 (WIRE) ×1 IMPLANT

## 2019-09-06 NOTE — Progress Notes (Addendum)
[facility] PROGRESS NOTE    Progress Note  [name].  YOV:[id number] DOB: April 21, 1946 DOA: 09/01/2019 PCP: [name], MD     Brief Narrative:   [name]. is an 74 y.o. male past medical history of end-stage renal disease on hemodialysis Tuesday Thursdays and Saturdays, CAD, essential hypertension interstitial lung disease, bladder cancer in 2009, diabetes mellitus type 2 left high which has been enucleated, A. fib and prior VTE on Eliquis who underwent an left upper extremity fistula repair on 08/30/2019 last session of hemodialysis on 08/31/2019 apparently took like 4 times to cannulate the fistula during HD, presented with left upper extremity swelling and bruising, was found short of breath with hemoglobin of 6.9 (back in January 2020 when he was 6) Hemoccult is negative.  Assessment/Plan:   History of coronary artery disease with elevated troponins: Currently chest pain-free Her troponins are elevated but has remained fairly flat in the setting of end-stage renal disease. EKG showed no concerns for ischemia, 2D echo was done that showed an EF of 60% with akinesia of the inferior lateral wall and grade 2 diastolic heart failure. Cardiology was consulted due to the wall motion abnormalities cardiac cath was performed that showed distally RCA heavily calcified with moderate to severe elevated left ventricular end-diastolic pressure. Cardiology recommended arterectomy followed by PCI on 09/06/2019.  He is currently on Plavix. He will need Plavix and a DOAC upon discharge for a month. Spoke with cardiology will have to stop metoprolol due to hypotension.  Acute on chronic anemia of chronic renal disease: With a hemoglobin of 6.9 baseline usually around 8. Hemoccult is negative. He status post 1 unit of packed red blood cells during HD on 09/02/2019. Eliquis has been on hold, IV iron per nephrology.  Left upper extremity swelling/hematoma: Dr. Carlis Abbott from vascular has  been consulted and advised to fistula can be used thinking about the hematoma and continue conservative measures with elevation of the ER.  Volume overload and end-stage renal disease patient/hyperkalemia: Nephrology consulted continue HD.  Acute on chronic respiratory failure with hypoxia: Likely due to #1, CT angio of the chest negative for PE, he was noted to have bilateral pleural effusions. Continue supplemental oxygen.  Paroxysmal atrial fibrillation: We will have to hold metoprolol, have discussed with cardiology will start amiodarone, Eliquis has been held for cardiac cath on 09/06/2019.  Essential hypertension: We will started on midodrine per nephrology.  Diabetes mellitus type 2: With an A1c of 6.4 in the setting of end-stage renal disease.   DVT prophylaxis: heparin Family Communication:none Disposition Plan/Barrier to D/C:   Code Status:     Code Status Orders  (From admission, onward)         Start     Ordered   09/02/19 1108  Limited resuscitation (code)  Continuous    Question Answer Comment  In the event of cardiac or respiratory ARREST: Initiate Code Blue, Call Rapid Response Yes   In the event of cardiac or respiratory ARREST: Perform CPR Yes   In the event of cardiac or respiratory ARREST: Perform Intubation/Mechanical Ventilation No   In the event of cardiac or respiratory ARREST: Use NIPPV/BiPAp only if indicated Yes   In the event of cardiac or respiratory ARREST: Administer ACLS medications if indicated Yes   In the event of cardiac or respiratory ARREST: Perform Defibrillation or Cardioversion if indicated Yes      09/02/19 1107        Code Status History  Date Active Date Inactive Code Status Order ID Comments User Context   09/01/2019 2250 09/02/2019 1107 Full Code [id number]  Roxan Hockey, MD Inpatient   07/16/2019 1030 07/19/2019 0351 Full Code [id number]  Karmen Bongo, MD ED   11/04/2018 2006 11/09/2018 1745 Full Code [id number]  Elwyn Reach, MD Inpatient   10/26/2018 2004 10/27/2018 2109 Full Code [id number]  Ivor Costa, MD ED   09/27/2018 1521 10/20/2018 2008 Full Code [id number]  Merton Border, MD Inpatient   06/10/2018 1824 06/11/2018 1333 Full Code [id number]  [name], MD Inpatient   12/12/2014 1139 12/13/2014 0324 Full Code [id number]  Nelson Chimes, MD HOV   10/02/2013 1137 10/02/2013 1932 Partial Code [id number]  Sol Passer, MD ED   Advance Care Planning Activity        IV Access:    Peripheral IV   Procedures and diagnostic studies:   CARDIAC CATHETERIZATION  Result Date: 09/05/2019  The left ventricular systolic function is normal. The left ventricular ejection fraction is 50-55% by visual estimate -apparent inferior hypokinesis  LV end diastolic pressure is moderate-severely elevated.  Mid RCA to Dist RCA lesion is 90% stenosed. (Heavily calcified focal lesion)  Prox RCA lesion is 40% stenosed. Prox RCA to Mid RCA lesion is 50% stenosed with 30% stenosed side branch in RV Branch. Mid RCA lesion is 35% stenosed.  Mid LAD lesion is 30% stenosed with 25% stenosed side branch in 2nd Diag.  SUMMARY  Distal RCA heavily calcified 90% lesion with moderate calcified disease upstream.  Minimal LCA disease.  Moderate to severely elevated LVEDP despite systemic hypotension postdialysis. RECOMMENDATIONS  Patient has a severe calcified lesion in the RCA needs to be treated with atherectomy followed by PCI.  We will plan staged atherectomy based PCI by Dr. Fletcher Anon on 09/06/2018 1 in the afternoon.  (Precath orders written)  We will load with 300 mg Plavix tonight and set her milligrams in the morning tomorrow. ->   Plan will be to use Plavix plus DOAC on discharge (potentially aspirin for 1 month)  Continue aggressive GDMT for CAD-already on statin.  Blood pressure likely not able to tolerate beta-blocker. Glenetta Hew, MD    Medical Consultants:    None.  Anti-Infectives:  None  Subjective:    [name].  denies any chest pain.  Objective:    Vitals:   09/06/19 0245 09/06/19 0320 09/06/19 0530 09/06/19 0632  BP: (!) 56/45 (!) 64/49 (!) 71/47 (!) 72/51  Pulse: 66 69  67  Resp: 18 14  (!) 24  Temp:    97.6 F (36.4 C)  TempSrc:    Axillary  SpO2: 99% 100%  98%  Weight:    73.7 kg  Height:       SpO2: 98 % O2 Flow Rate (L/min): 2 L/min   Intake/Output Summary (Last 24 hours) at 09/06/2019 0848 Last data filed at 09/05/2019 1600 Gross per 24 hour  Intake 0 ml  Output 238 ml  Net -238 ml   Filed Weights   09/05/19 1220 09/05/19 1600 09/06/19 0632  Weight: 76.8 kg 72.6 kg 73.7 kg    Exam: General exam: In no acute distress. Respiratory system: Good air movement and clear to auscultation. Cardiovascular system: S1 & S2 heard, RRR. No JVD. Gastrointestinal system: Abdomen is nondistended, soft and nontender.  Extremities: No pedal edema. Skin: No rashes, lesions or ulcers  Data Reviewed:    Labs: Basic Metabolic Panel: Recent Labs  Lab 09/02/19 0441 09/02/19 0441 09/03/19 0520  09/03/19 0520 09/04/19 0449 09/04/19 0449 09/05/19 0458 09/05/19 0458 09/05/19 0839 09/06/19 0330  NA 136  --  138  --  135  --  137  --   --  135  K 4.7   < > 4.4   < > 4.5   < > 5.7*   < > 5.8* 4.7  CL 97*  --  98  --  98  --  96*  --   --  95*  CO2 27  --  30  --  23  --  26  --   --  28  GLUCOSE 92  --  91  --  82  --  83  --   --  100*  BUN 30*  --  17  --  28*  --  39*  --   --  20  CREATININE 6.01*  --  4.16*  --  5.51*  --  7.42*  --   --  4.61*  CALCIUM 8.1*  --  7.6*  --  7.6*  --  7.9*  --   --  7.7*  PHOS  --   --  4.9*  --  6.6*  --  8.2*  --   --  5.4*   < > = values in this interval not displayed.   GFR Estimated Creatinine Clearance: 14.4 mL/min (A) (by C-G formula based on SCr of 4.61 mg/dL (H)). Liver Function Tests: Recent Labs  Lab 09/01/19 1308 09/03/19 0520 09/04/19 0449 09/05/19 0458 09/06/19 0330  AST 36  --   --   --   --   ALT 12  --   --   --   --     ALKPHOS 60  --   --   --   --   BILITOT 0.7  --   --   --   --   PROT 6.0*  --   --   --   --   ALBUMIN 2.5* 2.0* 2.0* 2.1* 1.9*   No results for input(s): LIPASE, AMYLASE in the last 168 hours. No results for input(s): AMMONIA in the last 168 hours. Coagulation profile No results for input(s): INR, PROTIME in the last 168 hours. COVID-19 Labs  No results for input(s): DDIMER, FERRITIN, LDH, CRP in the last 72 hours.  Lab Results  Component Value Date   [place] NEGATIVE 09/01/2019   [place] NEGATIVE 07/16/2019   SARSCOV2NAA NOT DETECTED 04/25/2019   [place] NEGATIVE 01/27/2019    CBC: Recent Labs  Lab 09/01/19 1308 09/01/19 1308 09/02/19 0441 09/02/19 0441 09/02/19 1839 09/03/19 0520 09/04/19 0449 09/05/19 0458 09/06/19 0330  WBC 6.1   < > 6.8  --   --  6.3 6.8 7.6 5.4  NEUTROABS 4.1  --   --   --   --  3.4 4.2 4.3 2.9  HGB 6.9*   < > 7.0*   < > 8.9* 8.7* 9.2* 8.9* 8.5*  HCT 22.3*   < > 22.6*   < > 27.6* 27.1* 28.5* 28.2* 26.7*  MCV 94.5   < > 93.8  --   --  90.9 92.5 90.7 90.5  PLT 154   < > 187  --   --  158 149* 168 147*   < > = values in this interval not displayed.   Cardiac Enzymes: No results for input(s): CKTOTAL, CKMB, CKMBINDEX, TROPONINI in the last 168 hours. BNP (last 3 results) No results for input(s): PROBNP in the last  8760 hours. CBG: Recent Labs  Lab 09/05/19 1052 09/05/19 1426 09/05/19 1628 09/05/19 2205 09/06/19 0743  GLUCAP 71 76 62* 99 73   D-Dimer: No results for input(s): DDIMER in the last 72 hours. Hgb A1c: No results for input(s): HGBA1C in the last 72 hours. Lipid Profile: No results for input(s): CHOL, HDL, LDLCALC, TRIG, CHOLHDL, LDLDIRECT in the last 72 hours. Thyroid function studies: No results for input(s): TSH, T4TOTAL, T3FREE, THYROIDAB in the last 72 hours.  Invalid input(s): FREET3 Anemia work up: No results for input(s): VITAMINB12, FOLATE, FERRITIN, TIBC, IRON, RETICCTPCT in the last 72  hours. Sepsis Labs: Recent Labs  Lab 09/03/19 0520 09/04/19 0449 09/05/19 0458 09/06/19 0330  WBC 6.3 6.8 7.6 5.4   Microbiology Recent Results (from the past 240 hour(s))  Respiratory Panel by RT PCR (Flu A&B, Covid) - Nasopharyngeal Swab     Status: None   Collection Time: 09/01/19  5:16 PM   Specimen: Nasopharyngeal Swab  Result Value Ref Range Status   SARS Coronavirus 2 by RT PCR NEGATIVE NEGATIVE Final    Comment: (NOTE) SARS-CoV-2 target nucleic acids are NOT DETECTED. The SARS-CoV-2 RNA is generally detectable in upper respiratoy specimens during the acute phase of infection. The lowest concentration of SARS-CoV-2 viral copies this assay can detect is 131 copies/mL. A negative result does not preclude SARS-Cov-2 infection and should not be used as the sole basis for treatment or other patient management decisions. A negative result may occur with  improper specimen collection/handling, submission of specimen other than nasopharyngeal swab, presence of viral mutation(s) within the areas targeted by this assay, and inadequate number of viral copies (<131 copies/mL). A negative result must be combined with clinical observations, patient history, and epidemiological information. The expected result is Negative. Fact Sheet for Patients:  PinkCheek.be Fact Sheet for Healthcare Providers:  GravelBags.it This test is not yet ap proved or cleared by the [place] FDA and  has been authorized for detection and/or diagnosis of SARS-CoV-2 by FDA under an Emergency Use Authorization (EUA). This EUA will remain  in effect (meaning this test can be used) for the duration of the COVID-19 declaration under Section 564(b)(1) of the Act, 21 U.S.C. section 360bbb-3(b)(1), unless the authorization is terminated or revoked sooner.    Influenza A by PCR NEGATIVE NEGATIVE Final   Influenza B by PCR NEGATIVE NEGATIVE Final     Comment: (NOTE) The Xpert Xpress SARS-CoV-2/FLU/RSV assay is intended as an aid in  the diagnosis of influenza from Nasopharyngeal swab specimens and  should not be used as a sole basis for treatment. Nasal washings and  aspirates are unacceptable for Xpert Xpress SARS-CoV-2/FLU/RSV  testing. Fact Sheet for Patients: PinkCheek.be Fact Sheet for Healthcare Providers: GravelBags.it This test is not yet approved or cleared by the [place] FDA and  has been authorized for detection and/or diagnosis of SARS-CoV-2 by  FDA under an Emergency Use Authorization (EUA). This EUA will remain  in effect (meaning this test can be used) for the duration of the  Covid-19 declaration under Section 564(b)(1) of the Act, 21  U.S.C. section 360bbb-3(b)(1), unless the authorization is  terminated or revoked. Performed at [facility], [address], [place], [place] [address]   MRSA PCR Screening     Status: None   Collection Time: 09/01/19 11:10 PM   Specimen: Nasal Mucosa; Nasopharyngeal  Result Value Ref Range Status   MRSA by PCR NEGATIVE NEGATIVE Final    Comment:  The GeneXpert MRSA Assay (FDA approved for NASAL specimens only), is one component of a comprehensive MRSA colonization surveillance program. It is not intended to diagnose MRSA infection nor to guide or monitor treatment for MRSA infections. Performed at [facility], [place] [address], [place], [place] [address]      Medications:   . aspirin      . aspirin EC  81 mg Oral Daily  . atorvastatin  80 mg Oral QHS  . calcitRIOL  0.25 mcg Oral Q T,Th,Sa-HD  . calcium acetate  667 mg Oral TID WC  . Chlorhexidine Gluconate Cloth  6 each Topical Q0600  . clopidogrel  75 mg Oral Q breakfast  . ezetimibe  10 mg Oral Daily  . insulin aspart  0-5 Units Subcutaneous QHS  . insulin aspart  0-6 Units Subcutaneous TID WC  . metoprolol tartrate  12.5 mg Oral BID  .  midodrine  10 mg Oral TID WC  . multivitamin  1 tablet Oral QHS  . nicotine  7 mg Transdermal Daily  . sodium chloride flush  3 mL Intravenous Q12H  . sodium chloride flush  3 mL Intravenous Q12H  . sodium chloride flush  3 mL Intravenous Q12H  . tiZANidine  2 mg Oral QHS  . vitamin E  400 Units Oral Daily   Continuous Infusions: . sodium chloride    . sodium chloride    . sodium chloride    . sodium chloride 10 mL/hr at 09/06/19 0803      LOS: 5 days   Charlynne Cousins  [facility]  09/06/2019, 8:48 AM

## 2019-09-06 NOTE — Progress Notes (Signed)
Progress Note  Patient Name: [name]. Date of Encounter: 09/06/2019  Primary Cardiologist: Dorris Carnes, MD   Subjective   The patient is feeling well today.   Inpatient Medications    Scheduled Meds: . aspirin      . [MAR Hold] aspirin EC  81 mg Oral Daily  . [MAR Hold] atorvastatin  80 mg Oral QHS  . [MAR Hold] calcitRIOL  0.25 mcg Oral Q T,Th,Sa-HD  . [MAR Hold] calcium acetate  667 mg Oral TID WC  . [MAR Hold] Chlorhexidine Gluconate Cloth  6 each Topical Q0600  . [MAR Hold] clopidogrel  75 mg Oral Q breakfast  . [MAR Hold] ezetimibe  10 mg Oral Daily  . [MAR Hold] insulin aspart  0-5 Units Subcutaneous QHS  . [MAR Hold] insulin aspart  0-6 Units Subcutaneous TID WC  . [MAR Hold] metoprolol tartrate  12.5 mg Oral BID  . [MAR Hold] midodrine  10 mg Oral TID WC  . [MAR Hold] multivitamin  1 tablet Oral QHS  . [MAR Hold] nicotine  7 mg Transdermal Daily  . [MAR Hold] sodium chloride flush  3 mL Intravenous Q12H  . [MAR Hold] sodium chloride flush  3 mL Intravenous Q12H  . sodium chloride flush  3 mL Intravenous Q12H  . [MAR Hold] tiZANidine  2 mg Oral QHS  . [MAR Hold] vitamin E  400 Units Oral Daily   Continuous Infusions: . [MAR Hold] sodium chloride    . [MAR Hold] sodium chloride    . sodium chloride    . sodium chloride 10 mL/hr at 09/06/19 0803   PRN Meds: [MAR Hold] sodium chloride, [MAR Hold] sodium chloride, sodium chloride, [MAR Hold] acetaminophen **OR** [MAR Hold] acetaminophen, [MAR Hold] erythromycin, [MAR Hold] ondansetron (ZOFRAN) IV, [MAR Hold] ondansetron, [MAR Hold] oxyCODONE-acetaminophen **AND** [MAR Hold] oxyCODONE, [MAR Hold] polyethylene glycol, [MAR Hold] sodium chloride flush, [MAR Hold] sodium chloride flush, sodium chloride flush, [MAR Hold] traZODone   Vital Signs    Vitals:   09/06/19 1012 09/06/19 1017 09/06/19 1022 09/06/19 1024  BP: (!) 94/59 (!) 96/59 (!) 93/57 96/60  Pulse: 79 80 80 91  Resp: (!) 22 (!) 25 (!) 21 16   Temp:      TempSrc:      SpO2: 96% 96% 96% 96%  Weight:      Height:        Intake/Output Summary (Last 24 hours) at 09/06/2019 1049 Last data filed at 09/05/2019 1600 Gross per 24 hour  Intake 0 ml  Output 238 ml  Net -238 ml   Last 3 Weights 09/06/2019 09/05/2019 09/05/2019  Weight (lbs) 162 lb 7.7 oz 160 lb 0.9 oz 169 lb 5 oz  Weight (kg) 73.7 kg 72.6 kg 76.8 kg     Telemetry    NSR - Personally Reviewed  ECG    n/a - Personally Reviewed  Physical Exam   GEN: No acute distress.   Neck: No JVD Cardiac: RRR, no murmurs, rubs, or gallops.  Respiratory: Clear to auscultation bilaterally. GI: Soft, nontender, non-distended  MS: No edema; No deformity. Neuro:  Nonfocal  Psych: Normal affect   Labs    High Sensitivity Troponin:   Recent Labs  Lab 09/01/19 1308 09/01/19 1428 09/03/19 1115  TROPONINIHS 2,381* 2,709* 2,321*     Chemistry Recent Labs  Lab 09/01/19 1308 09/02/19 0441 09/04/19 0449 09/04/19 0449 09/05/19 0458 09/05/19 0839 09/06/19 0330  NA 139   < > 135  --  137  --  135  K 4.5   < > 4.5   < > 5.7* 5.8* 4.7  CL 99   < > 98  --  96*  --  95*  CO2 30   < > 23  --  26  --  28  GLUCOSE 103*   < > 82  --  83  --  100*  BUN 26*   < > 28*  --  39*  --  20  CREATININE 4.97*   < > 5.51*  --  7.42*  --  4.61*  CALCIUM 8.2*   < > 7.6*  --  7.9*  --  7.7*  PROT 6.0*  --   --   --   --   --   --   ALBUMIN 2.5*   < > 2.0*  --  2.1*  --  1.9*  AST 36  --   --   --   --   --   --   ALT 12  --   --   --   --   --   --   ALKPHOS 60  --   --   --   --   --   --   BILITOT 0.7  --   --   --   --   --   --   GFRNONAA 11*   < > 9*  --  6*  --  12*  GFRAA 12*   < > 11*  --  8*  --  13*  ANIONGAP 10   < > 14  --  15  --  12   < > = values in this interval not displayed.    Hematology Recent Labs  Lab 09/04/19 0449 09/05/19 0458 09/06/19 0330  WBC 6.8 7.6 5.4  RBC 3.08* 3.11* 2.95*  HGB 9.2* 8.9* 8.5*  HCT 28.5* 28.2* 26.7*  MCV 92.5 90.7 90.5  MCH 29.9  28.6 28.8  MCHC 32.3 31.6 31.8  RDW 18.7* 18.6* 18.4*  PLT 149* 168 147*   BNPNo results for input(s): BNP, PROBNP in the last 168 hours.   DDimer No results for input(s): DDIMER in the last 168 hours.   Radiology    CARDIAC CATHETERIZATION  Result Date: 09/05/2019  The left ventricular systolic function is normal. The left ventricular ejection fraction is 50-55% by visual estimate -apparent inferior hypokinesis  LV end diastolic pressure is moderate-severely elevated.  Mid RCA to Dist RCA lesion is 90% stenosed. (Heavily calcified focal lesion)  Prox RCA lesion is 40% stenosed. Prox RCA to Mid RCA lesion is 50% stenosed with 30% stenosed side branch in RV Branch. Mid RCA lesion is 35% stenosed.  Mid LAD lesion is 30% stenosed with 25% stenosed side branch in 2nd Diag.  SUMMARY  Distal RCA heavily calcified 90% lesion with moderate calcified disease upstream.  Minimal LCA disease.  Moderate to severely elevated LVEDP despite systemic hypotension postdialysis. RECOMMENDATIONS  Patient has a severe calcified lesion in the RCA needs to be treated with atherectomy followed by PCI.  We will plan staged atherectomy based PCI by Dr. Fletcher Anon on 09/06/2018 1 in the afternoon.  (Precath orders written)  We will load with 300 mg Plavix tonight and set her milligrams in the morning tomorrow. ->   Plan will be to use Plavix plus DOAC on discharge (potentially aspirin for 1 month)  Continue aggressive GDMT for CAD-already on statin.  Blood pressure likely not able to tolerate beta-blocker. Shanon Brow  Ellyn Hack, MD   Cardiac Studies    Patient Profile     [name]. is a 74 y.o. male with a hx of coronary artery disease s/p BMS to RCA in 2007 (negative Myoview in 2010), PVD (occluded abd Aorta >> s/p Ax-Fem bypass), carotid artery disease, ESRD on dialysis, prior UGI bleed, prior DVT, atrial fibrillation, chronic anticoagulation with Apixaban, COPD, hypertension, hyperlipidemia, diastolic CHF who is  being seen today for the evaluation of an elevated Troponin at the request of Dr. Denton Brick.  Assessment & Plan    1. NSTEMI, CAD with prior BMS to RCA in 2017 - troponin to 2700 without clear peak. - admitted with significant anemia, Hgb 6.9. Today stable Hb at 8.5. - echo this admit LVEF 50-55%, akinesis inferolateral wall. Grade II DDX. Mod MR, mild AI - Jan 2020 echo 60-65%, normal WM - limited management options in setting of severe anemia - no anticoag. He is on atorva 80, metoprolol 12.5mg  bid,  -Patient underwent dialysis yesterday for hyperkalemia, he subsequently underwent cardiac cath that showed  Cath today:  Mid LAD lesion is 30% stenosed with 25% stenosed side branch in 2nd Diag.  Mid RCA to Dist RCA lesion is 90% stenosed.  Mid RCA lesion is 35% stenosed.  Prox RCA to Mid RCA lesion is 50% stenosed with 30% stenosed side branch in RV Branch.  Prox RCA lesion is 40% stenosed.  Post intervention, there is a 0% residual stenosis.  Post intervention, there is a 0% residual stenosis.  A drug-eluting stent was successfully placed using a STENT RESOLUTE ONYX 3.5X22.   Successful orbital atherectomy and drug-eluting stent placement to mid/distal right coronary artery.  Recommendations: Given the patient's anemia, I do not think he will tolerate resumption of anticoagulation at the present time.  Recommend treatment with dual antiplatelet therapy with aspirin and clopidogrel for at least 6 months and if the patient's anemia is improved then, resuming anticoagulation can be considered with stopping aspirin. Continue aggressive treatment of risk factors.  We will plan for a discharge tomorrow.  2. Anemia - baseline Hgb 8-9, admitted at 6.9 - FOBT is negative. There has not been active signs of bleeding - given 1 unit pRBCs, Hgb up to 8.9 - follow trend.   3. PAF - eliquis on hold in setting of severe anemia - hold for now and follow H&H and cath eligibility.   4.  ESRD - HD per nephrology  For questions or updates, please contact [place] Please consult [email or web address] for contact info under     Signed, Ena Dawley, MD  09/06/2019, 10:49 AM

## 2019-09-06 NOTE — Progress Notes (Signed)
[facility] Progress Note   Subjective:   Patient seen in room. Tired and hungry, looking forward to lunch. Reports he was SOB when O2 was removed but comfortable at present. Chest is sore. Denies dizziness, abdominal pain, N/V/D. S/p cardiac cath 3/9 and 3/10.   Objective Vitals:   09/06/19 1012 09/06/19 1017 09/06/19 1022 09/06/19 1024  BP: (!) 94/59 (!) 96/59 (!) 93/57 96/60  Pulse: 79 80 80 91  Resp: (!) 22 (!) 25 (!) 21 16  Temp:      TempSrc:      SpO2: 96% 96% 96% 96%  Weight:      Height:       Physical Exam General: Well developed, chronically ill appearing male. Alert and in NAD Heart: RRR, no murmurs, rubs or gallops Lungs: Decreased breath sounds bilateral bases. No wheezing or rales auscultated Abdomen: Soft, non-tender, non-distended, +BS Extremities: No edema b/l lower extremities Dialysis Access:  RUE avf + bruit, improving hematoma and edema  Additional Objective Labs: Basic Metabolic Panel: Recent Labs  Lab 09/04/19 0449 09/04/19 0449 09/05/19 0458 09/05/19 0839 09/06/19 0330  NA 135  --  137  --  135  K 4.5   < > 5.7* 5.8* 4.7  CL 98  --  96*  --  95*  CO2 23  --  26  --  28  GLUCOSE 82  --  83  --  100*  BUN 28*  --  39*  --  20  CREATININE 5.51*  --  7.42*  --  4.61*  CALCIUM 7.6*  --  7.9*  --  7.7*  PHOS 6.6*  --  8.2*  --  5.4*   < > = values in this interval not displayed.   Liver Function Tests: Recent Labs  Lab 09/01/19 1308 09/03/19 0520 09/04/19 0449 09/05/19 0458 09/06/19 0330  AST 36  --   --   --   --   ALT 12  --   --   --   --   ALKPHOS 60  --   --   --   --   BILITOT 0.7  --   --   --   --   PROT 6.0*  --   --   --   --   ALBUMIN 2.5*   < > 2.0* 2.1* 1.9*   < > = values in this interval not displayed.   CBC: Recent Labs  Lab 09/02/19 0441 09/02/19 1839 09/03/19 0520 09/03/19 0520 09/04/19 0449 09/05/19 0458 09/06/19 0330  WBC 6.8  --  6.3   < > 6.8 7.6 5.4  NEUTROABS  --   --  3.4   < > 4.2  4.3 2.9  HGB 7.0*   < > 8.7*   < > 9.2* 8.9* 8.5*  HCT 22.6*   < > 27.1*   < > 28.5* 28.2* 26.7*  MCV 93.8  --  90.9  --  92.5 90.7 90.5  PLT 187  --  158   < > 149* 168 147*   < > = values in this interval not displayed.   Blood Culture    Component Value Date/Time   SDES URINE, RANDOM 11/05/2018 0602   SPECREQUEST NONE 11/05/2018 0602   CULT  11/05/2018 0602    NO GROWTH Performed at [facility], [place] [address], [place], [place] [address]    REPTSTATUS 11/06/2018 FINAL 11/05/2018 0602    CBG: Recent Labs  Lab 09/05/19 1426  09/05/19 1628 09/05/19 2205 09/06/19 0743 09/06/19 1141  GLUCAP 76 62* 99 73 63*    Studies/Results: CARDIAC CATHETERIZATION  Result Date: 09/06/2019  Mid LAD lesion is 30% stenosed with 25% stenosed side branch in 2nd Diag.  Mid RCA to Dist RCA lesion is 90% stenosed.  Mid RCA lesion is 35% stenosed.  Prox RCA to Mid RCA lesion is 50% stenosed with 30% stenosed side branch in RV Branch.  Prox RCA lesion is 40% stenosed.  Post intervention, there is a 0% residual stenosis.  Post intervention, there is a 0% residual stenosis.  A drug-eluting stent was successfully placed using a STENT RESOLUTE ONYX 3.5X22.  Successful orbital atherectomy and drug-eluting stent placement to mid/distal right coronary artery. Recommendations: Given the patient's anemia, I do not think he will tolerate resumption of anticoagulation at the present time.  Recommend treatment with dual antiplatelet therapy with aspirin and clopidogrel for at least 6 months and if the patient's anemia is improved then, resuming anticoagulation can be considered with stopping aspirin. Continue aggressive treatment of risk factors.   CARDIAC CATHETERIZATION  Result Date: 09/05/2019  The left ventricular systolic function is normal. The left ventricular ejection fraction is 50-55% by visual estimate -apparent inferior hypokinesis  LV end diastolic pressure is moderate-severely elevated.   Mid RCA to Dist RCA lesion is 90% stenosed. (Heavily calcified focal lesion)  Prox RCA lesion is 40% stenosed. Prox RCA to Mid RCA lesion is 50% stenosed with 30% stenosed side branch in RV Branch. Mid RCA lesion is 35% stenosed.  Mid LAD lesion is 30% stenosed with 25% stenosed side branch in 2nd Diag.  SUMMARY  Distal RCA heavily calcified 90% lesion with moderate calcified disease upstream.  Minimal LCA disease.  Moderate to severely elevated LVEDP despite systemic hypotension postdialysis. RECOMMENDATIONS  Patient has a severe calcified lesion in the RCA needs to be treated with atherectomy followed by PCI.  We will plan staged atherectomy based PCI by Dr. Fletcher Anon on 09/06/2018 1 in the afternoon.  (Precath orders written)  We will load with 300 mg Plavix tonight and set her milligrams in the morning tomorrow. ->   Plan will be to use Plavix plus DOAC on discharge (potentially aspirin for 1 month)  Continue aggressive GDMT for CAD-already on statin.  Blood pressure likely not able to tolerate beta-blocker. Glenetta Hew, MD  Medications: . sodium chloride    . sodium chloride    . sodium chloride     . aspirin      . aspirin EC  81 mg Oral Daily  . atorvastatin  80 mg Oral QHS  . calcitRIOL  0.25 mcg Oral Q T,Th,Sa-HD  . calcium acetate  667 mg Oral TID WC  . Chlorhexidine Gluconate Cloth  6 each Topical Q0600  . clopidogrel  75 mg Oral Q breakfast  . ezetimibe  10 mg Oral Daily  . insulin aspart  0-5 Units Subcutaneous QHS  . insulin aspart  0-6 Units Subcutaneous TID WC  . metoprolol tartrate  12.5 mg Oral BID  . midodrine  10 mg Oral TID WC  . multivitamin  1 tablet Oral QHS  . nicotine  7 mg Transdermal Daily  . sodium chloride flush  3 mL Intravenous Q12H  . sodium chloride flush  3 mL Intravenous Q12H  . sodium chloride flush  3 mL Intravenous Q12H  . tiZANidine  2 mg Oral QHS  . vitamin E  400 Units Oral Daily    [facility]  NRe, T 3:30 hr, BFR 400/ DFR Auto 1.5, 2K, 2.5Ca, UF Profile 2, AVF Calcitriol 0.25 mcg PO q HD Mircera 50 mcg IV q 2 weeks- last dose 08/29/19 No binder   Assessment/Plan: 1. Shortness of breath: CTA negative for PE. Dyspnea likely multifactorial due toCAD,anemia and CHF with pleural effusions. Volume removal has been limited by hypotension but symptoms improved with HD. LVEDP 25 despite hypotension.  2. CAD: Pt with history of angioplasty and stenting. Troponin >2700. Transferred from [facility] to [facility] for further cardiology work up.New relative drop in LVEF and WMA. Cardiac cath 09/05/19: Distal RCA heavily calcified 90% lesion , underwent successful orbital atherectomy and drug-eluting stent placement to mid/distal right coronary artery on 09/06/19.  3. Left upper extremity edema:Recent LUE AVFprocedureon 3/3. Had difficulty cannulating 3/4 and patient believes needles infiltrated. LUE US showing hematoma. Vascular surgery recommending arm elevation. Able to cannulate, improving daily.  4. ESRD:TTS schedule. Hyperkalemic on 3/9, resolved after HD. Continue TTS schedule.  5. Hypertension/volume:BP soft,requires midodrine. Pleural effusions noted on CTA.On metoprolol for a.fib/ CHF though multiple doses have been held.LVEDP elevated. Continue UF with HD as BP tolerates. 6. Anemia:Hgb 6.9 >>> 9.2> 8.9.s/p 1 unit PRBC on 3/6.FOBT negative. ESA recently dosed.  7. Metabolic bone disease:Calcium 9.4 corrected, Continue calcitriol.Phos up to 8.2, not on phos binder as outpatient but was on calcium acetate previously, restarted. Phos 5.4 today. Follow calcium. 8. Nutrition:Renal diet with fluid restrictions   Anice Paganini, PA-C 09/06/2019, 12:09 PM  [facility] Pager: [phone]

## 2019-09-06 NOTE — Progress Notes (Signed)
Physical Therapy Treatment Patient Details Name: [name]. MRN: [id number] DOB: May 25, 1946 Today's Date: 09/06/2019    History of Present Illness [name]  is a 74 y.o. male  with a history of ESRD on dialysis T/Th/Sat,HTN, CAD, COPD, interstitial lung disease, inoperable hernia, bladder cancer (2009), T2DM,  L eye enucleation; afib, & prior VTE who underwent Lt UE AV fistula repair on 08/30/2019. Now presenting with increased left upper arm swelling and bruising, shortness of breath and found to have hemoglobin of 6.9 and elevated troponins.    PT Comments    Pt made some progress from last tx session, was still c/o weakness and pain prior to tx but with encouragement from nurse and therapist was agreeable to attempting mobility. PT had cath this pm and hence was limited in BUE use during session. Needed mod a for supine <>sit, able to sit edge of bed unsupported but again needed encouragement and cues for purse dlip breathing. Able to stand with HHA and mod a, stood with PLB and cues for approx 1 min  X2, pt c/o feeling dizzy and knees feeling as if would buckle. Pt was abel to ambulate x approx 61ft w/ HHA LUE and mod a. Gait is very unsteady with mix of pattern and multi loses of balance. Pt on 2L/min via [place] w/ sats in functional range. Pt c/o fatigue and pain in left sided chest at end. Nurse in room to assist.      Follow Up Recommendations  Home health PT     Equipment Recommendations  Other (comment)    Recommendations for Other Services       Precautions / Restrictions Precautions Precautions: Fall Precaution Comments: has hernia that will come out with increased standing, has hernia belt in room Restrictions Weight Bearing Restrictions: No    Mobility  Bed Mobility Overal bed mobility: Needs Assistance Bed Mobility: Supine to Sit;Sit to Supine     Supine to sit: Mod assist Sit to supine: Mod assist   General bed mobility comments: states unable to use BUE,  had cath earlier and LUE is swollen  Transfers Overall transfer level: Needs assistance Equipment used: 1 person hand held assist Transfers: Sit to/from [employer] Transfers Sit to Stand: Mod assist Stand pivot transfers: Mod assist       General transfer comment: unable to use RUE for mibility this pm  Ambulation/Gait Ambulation/Gait assistance: Min assist;Mod assist;+2 safety/equipment Gait Distance (Feet): 40 Feet Assistive device: 1 person hand held assist Gait Pattern/deviations: Antalgic;Ataxic;Wide base of support;Drifts right/left;Trunk flexed;Shuffle;Scissoring;Step-through pattern Gait velocity: slowed   General Gait Details: unable to use walker sec to inability to use RUE hence only able to use HHA, pt also c/o dizziness at start of ambulation, gait staggering, on 2L/min via [place] sats WFL but c/o pain in L side chest and knees feeling wobbly   Stairs             Wheelchair Mobility    Modified Rankin (Stroke Patients Only)       Balance Overall balance assessment: Needs assistance Sitting-balance support: Feet supported Sitting balance-Leahy Scale: Fair     Standing balance support: During functional activity;Single extremity supported Standing balance-Leahy Scale: Poor Standing balance comment: in stance noted to be swaying reports knees feel wobbly                            Cognition Arousal/Alertness: Lethargic Behavior During Therapy: WFL for tasks assessed/performed Overall  Cognitive Status: No family/caregiver present to determine baseline cognitive functioning                                        Exercises      General Comments General comments (skin integrity, edema, etc.): still c/o fatigue and dizziness with mobility now with c/o pain in chest, nurse in room at tim eto hear complaint      Pertinent Vitals/Pain Pain Assessment: 0-10 Faces Pain Scale: Hurts even more Pain Location: LUE but now c/o  pain in chest Pain Descriptors / Indicators: Guarding;Grimacing;Discomfort;Moaning Pain Intervention(s): Limited activity within patient's tolerance;Monitored during session    Home Living                      Prior Function            PT Goals (current goals can now be found in the care plan section) Acute Rehab PT Goals Patient Stated Goal: wants to go home PT Goal Formulation: With patient Time For Goal Achievement: 09/17/19 Potential to Achieve Goals: Good Progress towards PT goals: PT to reassess next treatment    Frequency    Min 3X/week      PT Plan Current plan remains appropriate    Co-evaluation              AM-PAC PT "6 Clicks" Mobility   Outcome Measure  Help needed turning from your back to your side while in a flat bed without using bedrails?: A Lot Help needed moving from lying on your back to sitting on the side of a flat bed without using bedrails?: A Lot Help needed moving to and from a bed to a chair (including a wheelchair)?: A Lot Help needed standing up from a chair using your arms (e.g., wheelchair or bedside chair)?: A Lot Help needed to walk in hospital room?: A Lot Help needed climbing 3-5 steps with a railing? : A Lot 6 Click Score: 12    End of Session Equipment Utilized During Treatment: Gait belt;Oxygen Activity Tolerance: Patient limited by fatigue;Patient limited by lethargy;Patient limited by pain;Treatment limited secondary to medical complications (Comment) Patient left: in bed;with call bell/phone within reach;with bed alarm set;with nursing/sitter in room Nurse Communication: Mobility status;Other (comment)(nurse in room for ambulation) PT Visit Diagnosis: Unsteadiness on feet (R26.81);Other abnormalities of gait and mobility (R26.89);Muscle weakness (generalized) (M62.81)     Time: 7124-5809 PT Time Calculation (min) (ACUTE ONLY): 28 min  Charges:  $Gait Training: 8-22 mins $Therapeutic Activity: 8-22 mins                      Horald Chestnut, PT    Delford Field 09/06/2019, 4:12 PM

## 2019-09-06 NOTE — TOC Progression Note (Signed)
Transition of Care (TOC) - Progression Note    Patient Details  Name: [name]. MRN: [id number] Date of Birth: 1946-01-18  Transition of [facility] CM/SW [place], [place] Phone Number: 09/06/2019, 1:36 PM  Clinical Narrative:     CSW spoke with pt's daughter [name] by phone.  Pt's daughter would like for pt to return home with [facility] at discharge if it is deemed appropriate for pt at discharge.  Pt's daughter will discuss with pt.  CSW will continue to follow.       Expected Discharge Plan and Services                                                 Social Determinants of Health (SDOH) Interventions    Readmission Risk Interventions Readmission Risk Prevention Plan 10/20/2018  Transportation Screening Complete  Medication Review ([place]) Complete  PCP or Specialist appointment within 3-5 days of discharge Not Complete  PCP/Specialist Appt Not Complete comments office to call and schedule  HRI or [place] Complete  SW Recovery Care/Counseling Consult Complete  [place] Not Applicable  Some recent data might be hidden

## 2019-09-06 NOTE — Interval H&P Note (Signed)
Cath Lab Visit (complete for each Cath Lab visit)  Clinical Evaluation Leading to the Procedure:   ACS: Yes.    Non-ACS:  n/a   History and Physical Interval Note:  09/06/2019 8:58 AM  [name].  has presented today for surgery, with the diagnosis of Coronary artery disease.  The various methods of treatment have been discussed with the patient and family. After consideration of risks, benefits and other options for treatment, the patient has consented to  Procedure(s): CORONARY ATHERECTOMY (N/A) as a surgical intervention.  The patient's history has been reviewed, patient examined, no change in status, stable for surgery.  I have reviewed the patient's chart and labs.  Questions were answered to the patient's satisfaction.     [name]

## 2019-09-06 NOTE — Progress Notes (Addendum)
Inpatient Diabetes Program Recommendations  AACE/ADA: New Consensus Statement on Inpatient Glycemic Control (2015)  Target Ranges:  Prepandial:   less than 140 mg/dL      Peak postprandial:   less than 180 mg/dL (1-2 hours)      Critically ill patients:  140 - 180 mg/dL   Results for [name], [name] (MRN [id number]) as of 09/06/2019 11:42  Ref. Range 09/05/2019 05:58 09/05/2019 07:00 09/05/2019 10:52 09/05/2019 14:26 09/05/2019 16:28 09/05/2019 22:05 09/06/2019 07:43  Glucose-Capillary Latest Ref Range: 70 - 99 mg/dL 74 85 71 76 62 (L) 99 73  Results for [name], [name] (MRN [id number]) as of 09/06/2019 11:42  Ref. Range 06/02/2018 11:54 10/27/2018 03:35 09/01/2019 13:08  Hemoglobin A1C Latest Ref Range: 4.8 - 5.6 % 5.3 5.0 4.4 (L)   Review of Glycemic Control  Diabetes history: DM2 Outpatient Diabetes medications: Tresiba 7 units daily if CBG >150 mg/dl, Novolog 0-5 units TID with meals Current orders for Inpatient glycemic control: Novolog 0-6 units TID with meals, Novolog 0-5 units QHS  Inpatient Diabetes Program Recommendations:   Insulin-Correction: Please discontinue Novolog correction scale.  HgbA1C: A1C 4.4% on 09/01/19 indicating an average glucose of 80 mg/dl; however, hemoglobin 6.9 g/dL on 09/01/19. Glucose since admitted has ranged from 57-136 mg/d with several episodes of hypoglycemia and no insulin given while inpatient. Would not recommend continuing Tresiba or Novolog as an outpatient and advise patient to follow up with PCP regarding glycemic control.  Thanks, Barnie Alderman, RN, MSN, CDE Diabetes Coordinator Inpatient Diabetes Program [phone] (Team Pager from 8am to 5pm)

## 2019-09-07 ENCOUNTER — Telehealth: Payer: Self-pay | Admitting: Cardiology

## 2019-09-07 DIAGNOSIS — I739 Peripheral vascular disease, unspecified: Secondary | ICD-10-CM

## 2019-09-07 DIAGNOSIS — J431 Panlobular emphysema: Secondary | ICD-10-CM

## 2019-09-07 LAB — RENAL FUNCTION PANEL
Albumin: 2.1 g/dL — ABNORMAL LOW (ref 3.5–5.0)
Albumin: 2.1 g/dL — ABNORMAL LOW (ref 3.5–5.0)
Anion gap: 16 — ABNORMAL HIGH (ref 5–15)
Anion gap: 9 (ref 5–15)
BUN: 31 mg/dL — ABNORMAL HIGH (ref 8–23)
BUN: 10 mg/dL (ref 8–23)
CO2: 21 mmol/L — ABNORMAL LOW (ref 22–32)
CO2: 30 mmol/L (ref 22–32)
Calcium: 8 mg/dL — ABNORMAL LOW (ref 8.9–10.3)
Calcium: 7.8 mg/dL — ABNORMAL LOW (ref 8.9–10.3)
Chloride: 96 mmol/L — ABNORMAL LOW (ref 98–111)
Chloride: 97 mmol/L — ABNORMAL LOW (ref 98–111)
Creatinine, Ser: 6.23 mg/dL — ABNORMAL HIGH (ref 0.61–1.24)
Creatinine, Ser: 3.23 mg/dL — ABNORMAL HIGH (ref 0.61–1.24)
GFR calc Af Amer: 9 mL/min — ABNORMAL LOW (ref >60)
GFR calc Af Amer: 21 mL/min — ABNORMAL LOW (ref >60)
GFR calc non Af Amer: 8 mL/min — ABNORMAL LOW (ref >60)
GFR calc non Af Amer: 18 mL/min — ABNORMAL LOW (ref >60)
Glucose, Bld: 114 mg/dL — ABNORMAL HIGH (ref 70–99)
Glucose, Bld: 90 mg/dL (ref 70–99)
Phosphorus: 7.3 mg/dL — ABNORMAL HIGH (ref 2.5–4.6)
Phosphorus: 3.5 mg/dL (ref 2.5–4.6)
Potassium: 4.3 mmol/L (ref 3.5–5.1)
Potassium: 3.6 mmol/L (ref 3.5–5.1)
Sodium: 133 mmol/L — ABNORMAL LOW (ref 135–145)
Sodium: 136 mmol/L (ref 135–145)

## 2019-09-07 LAB — LIPID PANEL
Cholesterol: 108 mg/dL (ref 0–200)
HDL: 24 mg/dL — ABNORMAL LOW (ref >40)
LDL Cholesterol: 65 mg/dL (ref 0–99)
Total CHOL/HDL Ratio: 4.5 RATIO
Triglycerides: 96 mg/dL (ref <150)
VLDL: 19 mg/dL (ref 0–40)

## 2019-09-07 LAB — CBC WITH DIFFERENTIAL/PLATELET
Abs Immature Granulocytes: 0.01 10*3/uL (ref 0.00–0.07)
Basophils Absolute: 0 10*3/uL (ref 0.0–0.1)
Basophils Relative: 0 %
Eosinophils Absolute: 0.1 10*3/uL (ref 0.0–0.5)
Eosinophils Relative: 2 %
HCT: 29.6 % — ABNORMAL LOW (ref 39.0–52.0)
Hemoglobin: 9.5 g/dL — ABNORMAL LOW (ref 13.0–17.0)
Immature Granulocytes: 0 %
Lymphocytes Relative: 32 %
Lymphs Abs: 1.7 10*3/uL (ref 0.7–4.0)
MCH: 29.4 pg (ref 26.0–34.0)
MCHC: 32.1 g/dL (ref 30.0–36.0)
MCV: 91.6 fL (ref 80.0–100.0)
Monocytes Absolute: 0.3 10*3/uL (ref 0.1–1.0)
Monocytes Relative: 6 %
Neutro Abs: 3.1 10*3/uL (ref 1.7–7.7)
Neutrophils Relative %: 60 %
Platelets: 171 10*3/uL (ref 150–400)
RBC: 3.23 MIL/uL — ABNORMAL LOW (ref 4.22–5.81)
RDW: 18.6 % — ABNORMAL HIGH (ref 11.5–15.5)
WBC: 5.3 10*3/uL (ref 4.0–10.5)
nRBC: 0 % (ref 0.0–0.2)

## 2019-09-07 LAB — CBC
HCT: 31.3 % — ABNORMAL LOW (ref 39.0–52.0)
Hemoglobin: 10 g/dL — ABNORMAL LOW (ref 13.0–17.0)
MCH: 28.6 pg (ref 26.0–34.0)
MCHC: 31.9 g/dL (ref 30.0–36.0)
MCV: 89.4 fL (ref 80.0–100.0)
Platelets: 163 10*3/uL (ref 150–400)
RBC: 3.5 MIL/uL — ABNORMAL LOW (ref 4.22–5.81)
RDW: 18.5 % — ABNORMAL HIGH (ref 11.5–15.5)
WBC: 6 10*3/uL (ref 4.0–10.5)
nRBC: 0 % (ref 0.0–0.2)

## 2019-09-07 LAB — GLUCOSE, CAPILLARY
Glucose-Capillary: 58 mg/dL — ABNORMAL LOW (ref 70–99)
Glucose-Capillary: 63 mg/dL — ABNORMAL LOW (ref 70–99)
Glucose-Capillary: 110 mg/dL — ABNORMAL HIGH (ref 70–99)
Glucose-Capillary: 74 mg/dL (ref 70–99)
Glucose-Capillary: 72 mg/dL (ref 70–99)

## 2019-09-07 LAB — HEPATITIS B CORE ANTIBODY, TOTAL: Hep B Core Total Ab: NONREACTIVE

## 2019-09-07 MED ORDER — AMIODARONE HCL 400 MG PO TABS: 400.0000 mg | ORAL_TABLET | Freq: Two times a day (BID) | ORAL | 0 refills | Status: DC

## 2019-09-07 MED ORDER — CLOPIDOGREL BISULFATE 75 MG PO TABS: 75.0000 mg | ORAL_TABLET | Freq: Every day | ORAL | 3 refills | Status: DC

## 2019-09-07 MED ORDER — POLYETHYLENE GLYCOL 3350 17 G PO PACK: 17.0000 g | PACK | Freq: Two times a day (BID) | ORAL | 0 refills | Status: DC | PRN

## 2019-09-07 MED ORDER — ASPIRIN 81 MG PO TBEC: 81.0000 mg | DELAYED_RELEASE_TABLET | Freq: Every day | ORAL | 3 refills | Status: DC

## 2019-09-07 MED ORDER — MIDODRINE HCL 10 MG PO TABS: 10.0000 mg | ORAL_TABLET | Freq: Three times a day (TID) | ORAL | 3 refills | Status: DC

## 2019-09-07 MED ORDER — CALCIUM ACETATE (PHOS BINDER) 667 MG PO CAPS: 667.0000 mg | ORAL_CAPSULE | Freq: Three times a day (TID) | ORAL | 3 refills | Status: DC

## 2019-09-07 MED ORDER — CALCITRIOL 0.25 MCG PO CAPS
ORAL_CAPSULE | ORAL | Status: AC
  Filled 2019-09-07: qty 1

## 2019-09-07 MED ORDER — MIDODRINE HCL 5 MG PO TABS
ORAL_TABLET | ORAL | Status: AC
  Filled 2019-09-07: qty 2

## 2019-09-07 NOTE — Discharge Instructions (Signed)
Radial Site Care  This sheet gives you information about how to care for yourself after your procedure. Your health care provider may also give you more specific instructions. If you have problems or questions, contact your health care provider. What can I expect after the procedure? After the procedure, it is common to have:  Bruising and tenderness at the catheter insertion area. Follow these instructions at home: Medicines  Take over-the-counter and prescription medicines only as told by your health care provider. Insertion site care  Follow instructions from your health care provider about how to take care of your insertion site. Make sure you: ? Wash your hands with soap and water before you change your bandage (dressing). If soap and water are not available, use hand sanitizer. ? Change your dressing as told by your health care provider. ? Leave stitches (sutures), skin glue, or adhesive strips in place. These skin closures may need to stay in place for 2 weeks or longer. If adhesive strip edges start to loosen and curl up, you may trim the loose edges. Do not remove adhesive strips completely unless your health care provider tells you to do that.  Check your insertion site every day for signs of infection. Check for: ? Redness, swelling, or pain. ? Fluid or blood. ? Pus or a bad smell. ? Warmth.  Do not take baths, swim, or use a hot tub until your health care provider approves.  You may shower 24-48 hours after the procedure, or as directed by your health care provider. ? Remove the dressing and gently wash the site with plain soap and water. ? Pat the area dry with a clean towel. ? Do not rub the site. That could cause bleeding.  Do not apply powder or lotion to the site. Activity   For 24 hours after the procedure, or as directed by your health care provider: ? Do not flex or bend the affected arm. ? Do not push or pull heavy objects with the affected arm. ? Do not  drive yourself home from the hospital or clinic. You may drive 24 hours after the procedure unless your health care provider tells you not to. ? Do not operate machinery or power tools.  Do not lift anything that is heavier than 10 lb (4.5 kg), or the limit that you are told, until your health care provider says that it is safe.  Ask your health care provider when it is okay to: ? Return to work or school. ? Resume usual physical activities or sports. ? Resume sexual activity. General instructions  If the catheter site starts to bleed, raise your arm and put firm pressure on the site. If the bleeding does not stop, get help right away. This is a medical emergency.  If you went home on the same day as your procedure, a responsible adult should be with you for the first 24 hours after you arrive home.  Keep all follow-up visits as told by your health care provider. This is important. Contact a health care provider if:  You have a fever.  You have redness, swelling, or yellow drainage around your insertion site. Get help right away if:  You have unusual pain at the radial site.  The catheter insertion area swells very fast.  The insertion area is bleeding, and the bleeding does not stop when you hold steady pressure on the area.  Your arm or hand becomes pale, cool, tingly, or numb. These symptoms may represent a serious problem   that is an emergency. Do not wait to see if the symptoms will go away. Get medical help right away. Call your local emergency services (911 in the [place] Do not drive yourself to the hospital. Summary  After the procedure, it is common to have bruising and tenderness at the site.  Follow instructions from your health care provider about how to take care of your radial site wound. Check the wound every day for signs of infection.  Do not lift anything that is heavier than 10 lb (4.5 kg), or the limit that you are told, until your health care provider says  that it is safe. This information is not intended to replace advice given to you by your health care provider. Make sure you discuss any questions you have with your health care provider. Document Revised: 07/21/2017 Document Reviewed: 07/21/2017 Elsevier Patient Education  2020 [employer]  

## 2019-09-07 NOTE — Telephone Encounter (Signed)
TOC Patient- Please call Patient- Pt have an appt with Pecolia Ades on 09-18-19

## 2019-09-07 NOTE — Discharge Summary (Addendum)
Physician Discharge Summary  [name]. ZOX:[id number] DOB: March 14, 1946 DOA: 09/01/2019  PCP: [name], MD  Admit date: 09/01/2019 Discharge date: 09/07/2019  Admitted From: Home Disposition:  Home  Recommendations for Outpatient Follow-up:  1. Follow up with PCP in 1-2 weeks 2. Please obtain BMP/CBC in one week   Home Health:Yes Equipment/Devices:None  Discharge Condition:stable CODE STATUS:Full Diet recommendation: Heart Healthy  Brief/Interim Summary: 74 y.o. male past medical history of end-stage renal disease on hemodialysis Tuesday Thursdays and Saturdays, CAD, essential hypertension interstitial lung disease, bladder cancer in 2009, diabetes mellitus type 2 left high which has been enucleated, A. fib and prior VTE on Eliquis who underwent an left upper extremity fistula repair on 08/30/2019 last session of hemodialysis on 08/31/2019 apparently took like 4 times to cannulate the fistula during HD, presented with left upper extremity swelling and bruising, was found short of breath with hemoglobin of 6.9 (back in January 2020 when he was 6) Hemoccult is negative.  Discharge Diagnoses:  Principal Problem:   Symptomatic anemia Active Problems:   Essential hypertension   CAD, NATIVE VESSEL   PVD   COPD (chronic obstructive pulmonary disease) ([name])   Type II diabetes mellitus with renal manifestations ([name])   CAD (coronary artery disease)   ESRD on dialysis ([name])   Elevated troponin   ILD (interstitial lung disease) ([name])   Normocytic anemia   Atrial fibrillation ([name])   Non-ST elevation (NSTEMI) myocardial infarction ([place]) History of coronary artery disease with elevation in troponins: His troponins remain fairly flat in setting of end-stage renal disease, EKG showed no concerns of ischemia with 2D echo was done that showed continue the inferior wall and grade 2 diastolic heart failure. Cardiology was consulted due to the wall motion abnormalities they recommended a  cardiac cath that showed a heavily calcified RCA and moderate to severe left ventricular end-diastolic pressure elevated, the RCA was treated with arterectomy and DES to the mid distal RCA Showed residual disease in the LAD, cardiology recommended dual antiplatelet therapy aspirin and Plavix for at least 6 months and reevaluate as an outpatient to restart Eliquis.  Anemia on chronic anemia of chronic disease: With a hemoglobin of 6.9 on admission he was transfused 1 unit of packed red blood cells Hemoccult remained negative Eliquis was discontinued.   He will go home on aspirin and Plavix for 6 months will have to be reevaluated as an outpatient  Left upper extremity swelling hematoma: Vascular surgery was consulted and related that the fistula could be used above the hematoma.  End-stage renal disease/hyperkalemia/volume overload: Resolved with HD. He was started on midodrine due to his hypotension.  Metoprolol was discontinued.  Acute on chronic respiratory failure with hypoxia: Likely due to #1 CT angio of the chest was negative for PE he was noted to have bilateral pleural effusion he was weaned to room air.  Paroxysmal atrial fibrillation: Due to his hypotension his metoprolol was held he was started on oral amiodarone. Continue amiodarone and as an outpatient, he will also go home on aspirin and Plavix.  He will need further evaluation of his anemia as an outpatient by his PCP.  Essential hypertension: Antihypertensive medications were DC'd he was started on midodrine which she will continue as an outpatient.  Diabetes mellitus type 2 controlled: With an A1c of 6.4, in the setting of end-stage renal disease remained fairly controlled in-house.  Discharge Instructions  Discharge Instructions    AMB Referral to Cardiac Rehabilitation - Phase II  Complete by: As directed    Diagnosis:  Coronary Stents NSTEMI     After initial evaluation and assessments completed: Virtual Based  Care may be provided alone or in conjunction with Phase 2 Cardiac Rehab based on patient barriers.: Yes   Diet - low sodium heart healthy   Complete by: As directed    Increase activity slowly   Complete by: As directed      Allergies as of 09/07/2019      Reactions   Gelatin Other (See Comments)   ALPHA-GAL DANGER   Meat [alpha-gal] Other (See Comments)   REACTION TO HOOVED ANIMALS PARTICULARLY RED MEAT   Pork-derived Products Other (See Comments)   ALPHA-GAL DANGER   Shellfish Allergy Shortness Of Breath   Chicken Allergy Nausea And Vomiting   Ramipril Swelling   Tongue and throat swelling   Betaine Itching   Dextromethorphan-guaifenesin Swelling, Nausea And Vomiting   Other Other (See Comments)   Codeine Nausea And Vomiting   Morphine Itching      Medication List    STOP taking these medications   Eliquis 5 MG Tabs tablet Generic drug: apixaban   EPINEPHrine 0.3 mg/0.3 mL Soaj injection Commonly known as: EPI-PEN   polyethylene glycol powder 17 GM/SCOOP powder Commonly known as: GLYCOLAX/MIRALAX Replaced by: polyethylene glycol 17 g packet   RENA-VITE PO     TAKE these medications   amiodarone 400 MG tablet Commonly known as: PACERONE Take 1 tablet (400 mg total) by mouth 2 (two) times daily.   aspirin 81 MG EC tablet Take 1 tablet (81 mg total) by mouth daily.   atorvastatin 80 MG tablet Commonly known as: LIPITOR TAKE 1 TABLET AT BEDTIME   calcium acetate 667 MG capsule Commonly known as: PHOSLO Take 1 capsule (667 mg total) by mouth 3 (three) times daily with meals.   clopidogrel 75 MG tablet Commonly known as: PLAVIX Take 1 tablet (75 mg total) by mouth daily with breakfast.   docusate sodium 100 MG capsule Commonly known as: COLACE Take 100 mg by mouth daily.   ezetimibe 10 MG tablet Commonly known as: ZETIA TAKE 1 TABLET BY MOUTH EVERY DAY   insulin aspart 100 UNIT/ML injection Commonly known as: novoLOG Inject 0-5 Units into the skin 3  (three) times daily with meals. CBG 181-200:1 unit,CBG 201-250:2 units.CBG 251-300:3 units.CBG 301-350:5 U What changed:   when to take this  additional instructions   lidocaine 5 % Commonly known as: Lidoderm Place 1 patch onto the skin daily. Remove & Discard patch within 12 hours or as directed by MD What changed:   when to take this  reasons to take this   midodrine 10 MG tablet Commonly known as: PROAMATINE Take 1 tablet (10 mg total) by mouth 3 (three) times daily with meals.   ondansetron 4 MG tablet Commonly known as: Zofran Take 1 tablet (4 mg total) by mouth every 8 (eight) hours as needed for nausea or vomiting.   oxyCODONE-acetaminophen 10-325 MG tablet Commonly known as: PERCOCET Take 1 tablet by mouth every 4 (four) hours as needed for pain.   polyethylene glycol 17 g packet Commonly known as: MIRALAX / GLYCOLAX Take 17 g by mouth 2 (two) times daily as needed for moderate constipation. Replaces: polyethylene glycol powder 17 GM/SCOOP powder   tiZANidine 2 MG tablet Commonly known as: ZANAFLEX TAKE 1 TABLET BY MOUTH EVERY 8 HOURS AS NEEDED FOR MUSCLE SPASM What changed: See the new instructions.   Tyler Aas FlexTouch 100 UNIT/ML  FlexTouch Pen Generic drug: insulin degludec Inject 0.07 mLs (7 Units total) into the skin daily. What changed:   when to take this  reasons to take this   vitamin E 180 MG (400 UNITS) capsule Take 400 Units by mouth daily.            Durable Medical Equipment  (From admission, onward)         Start     Ordered   09/07/19 1457  For home use only DME oxygen  Once    Question Answer Comment  Length of Need 6 Months   Mode or (Route) Nasal cannula   Liters per Minute 2   Frequency Continuous (stationary and portable oxygen unit needed)   Oxygen conserving device Yes   Oxygen delivery system Gas      09/07/19 1457         Follow-up Information    Daune Perch, NP Follow up on 09/18/2019.   Specialty:  Cardiology Why: Hospital follow-up March 22nd at 2:15 PM Contact information: [address] [place] [address] [phone]        [facility] Follow up.   Why: Oxygen- for home delivered to the room Contact information: [place] [address] [phone]        [facility] Follow up.   Specialty: Home Health Services Why: Physical and Occupational Therapies, Registered Nurse-office to call with visit times.  Contact information: [address] [place] [address] [phone]          Allergies  Allergen Reactions  . Gelatin Other (See Comments)    ALPHA-GAL DANGER  . Meat [Alpha-Gal] Other (See Comments)    REACTION TO HOOVED ANIMALS PARTICULARLY RED MEAT  . Pork-Derived Products Other (See Comments)    ALPHA-GAL DANGER  . Shellfish Allergy Shortness Of Breath  . Chicken Allergy Nausea And Vomiting  . Ramipril Swelling    Tongue and throat swelling  . Betaine Itching  . Dextromethorphan-Guaifenesin Swelling and Nausea And Vomiting  . Other Other (See Comments)  . Codeine Nausea And Vomiting  . Morphine Itching     Cardiology  Nephrology  Vascular surgery   Procedures/Studies: CT Angio Chest PE W/Cm &/Or Wo Cm  Result Date: 09/01/2019 CLINICAL DATA:  Shortness of breath x2 days. EXAM: CT ANGIOGRAPHY CHEST WITH CONTRAST TECHNIQUE: Multidetector CT imaging of the chest was performed using the standard protocol during bolus administration of intravenous contrast. Multiplanar CT image reconstructions and MIPs were obtained to evaluate the vascular anatomy. CONTRAST:  148mL OMNIPAQUE IOHEXOL 350 MG/ML SOLN COMPARISON:  Nov 08, 2018 FINDINGS: Cardiovascular: There is marked severity calcification of the thoracic aorta. The subsegmental pulmonary arteries are limited in evaluation secondary to suboptimal opacification with intravenous contrast. This is most notable within the posterior aspect of the bilateral  lung bases. No evidence of pulmonary embolism. Normal heart size. No pericardial effusion. Marked severity coronary artery calcification is seen. A left axillary/femoral bypass graft is seen. Mediastinum/Nodes: No enlarged mediastinal, hilar, or axillary lymph nodes. Thyroid gland, trachea, and esophagus demonstrate no significant findings. Lungs/Pleura: A stable 5 mm calcified lung nodule is seen within the left lower lobe. A stable 0.9 cm x 0.9 cm focus of parenchymal low attenuation is seen within the posterior aspect of the right upper lobe (axial CT image 31, CT series number 6). A 1.3 cm x 1.1 cm noncalcified lung nodule is seen within the posterolateral aspect of the right  middle lobe (axial CT image 78, CT series number 6). This represents a new finding when compared to the prior exam. Mild, stable areas of atelectasis and/or infiltrate are seen within the posterior aspect of the bilateral lung bases. There are stable small to moderate size bilateral pleural effusions. No pneumothorax is identified. Upper Abdomen: No acute abnormality. Musculoskeletal: Multilevel degenerative changes seen throughout the thoracic spine. A chronic fracture of the posterior ninth right rib is noted. Two metallic density surgical pins are seen within the right humeral head. Review of the MIP images confirms the above findings. IMPRESSION: 1. No evidence of acute pulmonary embolism. 2. Stable small to moderate size bilateral pleural effusions. 3. Stable 0.9 cm x 0.9 cm focus of parenchymal low attenuation within the posterior aspect of the right upper lobe. 4. Interval development of a 1.3 cm x 1.1 cm noncalcified lung nodule within the posterolateral aspect of the right middle lobe. Given the rapid interval development of this nodule, a neoplastic process cannot be excluded. Recommend short-term interval follow-up and PET-CT for further evaluation. 5. Stable areas of atelectasis and/or infiltrate within the posterior aspect of  the bilateral lung bases. 6. Severe coronary artery calcification. 7. Stable appearance of a left axillary/femoral bypass graft. Aortic Atherosclerosis (ICD10-I70.0). Electronically Signed   By: Virgina Norfolk M.D.   On: 09/01/2019 15:59   CARDIAC CATHETERIZATION  Result Date: 09/06/2019  Mid LAD lesion is 30% stenosed with 25% stenosed side branch in 2nd Diag.  Mid RCA to Dist RCA lesion is 90% stenosed.  Mid RCA lesion is 35% stenosed.  Prox RCA to Mid RCA lesion is 50% stenosed with 30% stenosed side branch in RV Branch.  Prox RCA lesion is 40% stenosed.  Post intervention, there is a 0% residual stenosis.  Post intervention, there is a 0% residual stenosis.  A drug-eluting stent was successfully placed using a STENT RESOLUTE ONYX 3.5X22.  Successful orbital atherectomy and drug-eluting stent placement to mid/distal right coronary artery. Recommendations: Given the patient's anemia, I do not think he will tolerate resumption of anticoagulation at the present time.  Recommend treatment with dual antiplatelet therapy with aspirin and clopidogrel for at least 6 months and if the patient's anemia is improved then, resuming anticoagulation can be considered with stopping aspirin. Continue aggressive treatment of risk factors.   CARDIAC CATHETERIZATION  Result Date: 09/05/2019  The left ventricular systolic function is normal. The left ventricular ejection fraction is 50-55% by visual estimate -apparent inferior hypokinesis  LV end diastolic pressure is moderate-severely elevated.  Mid RCA to Dist RCA lesion is 90% stenosed. (Heavily calcified focal lesion)  Prox RCA lesion is 40% stenosed. Prox RCA to Mid RCA lesion is 50% stenosed with 30% stenosed side branch in RV Branch. Mid RCA lesion is 35% stenosed.  Mid LAD lesion is 30% stenosed with 25% stenosed side branch in 2nd Diag.  SUMMARY  Distal RCA heavily calcified 90% lesion with moderate calcified disease upstream.  Minimal LCA disease.   Moderate to severely elevated LVEDP despite systemic hypotension postdialysis. RECOMMENDATIONS  Patient has a severe calcified lesion in the RCA needs to be treated with atherectomy followed by PCI.  We will plan staged atherectomy based PCI by Dr. Fletcher Anon on 09/06/2018 1 in the afternoon.  (Precath orders written)  We will load with 300 mg Plavix tonight and set her milligrams in the morning tomorrow. ->   Plan will be to use Plavix plus DOAC on discharge (potentially aspirin for 1 month)  Continue aggressive  GDMT for CAD-already on statin.  Blood pressure likely not able to tolerate beta-blocker. Glenetta Hew, MD  DG Chest Portable 1 View  Result Date: 09/01/2019 CLINICAL DATA:  Dyspnea. EXAM: PORTABLE CHEST 1 VIEW COMPARISON:  July 16, 2019. FINDINGS: Stable cardiomediastinal silhouette. No pneumothorax or pleural effusion is noted. Slightly decreased interstitial densities are noted throughout both lungs suggesting improving edema or inflammation. Bony thorax is unremarkable. IMPRESSION: Slightly decreased interstitial densities are noted throughout both lungs suggesting improving edema or inflammation. Electronically Signed   By: Marijo Conception M.D.   On: 09/01/2019 13:17   ECHOCARDIOGRAM COMPLETE  Result Date: 09/02/2019    ECHOCARDIOGRAM REPORT   Patient Name:   [name]. Date of Exam: 09/02/2019 Medical Rec #:  [id number]         Height:       70.5 in Accession #:    [id number]        Weight:       157.0 lb Date of Birth:  06-Oct-1945          BSA:          1.893 m Patient Age:    74 years          BP:           90/53 mmHg Patient Gender: M                 HR:           78 bpm. Exam Location:  Inpatient Procedure: 2D Echo, Cardiac Doppler and Color Doppler Indications:    Dyspnea 786.09/R06.00  History:        Patient has prior history of Echocardiogram examinations, most                 recent 07/20/2018. CAD, COPD, Arrythmias:Atrial Fibrillation;                 Risk Factors:Hypertension,  Diabetes, Dyslipidemia and Current                 Smoker. ESRD.  Sonographer:    Clayton Lefort RDCS (AE) Referring Phys: [place]  1. Akinesis of the inferolateral wall with overall low normal LV function; Grade 2 diastolic dysfunction; mild LVH; mild AI; moderate MR; severe LAE; mild TR.  2. Left ventricular ejection fraction, by estimation, is 50 to 55%. The left ventricle has low normal function. The left ventricle demonstrates regional wall motion abnormalities (see scoring diagram/findings for description). There is mild left ventricular hypertrophy. Left ventricular diastolic parameters are consistent with Grade II diastolic dysfunction (pseudonormalization).  3. Right ventricular systolic function is normal. The right ventricular size is normal. There is mildly elevated pulmonary artery systolic pressure.  4. Left atrial size was severely dilated.  5. The mitral valve is normal in structure and function. Moderate mitral valve regurgitation. No evidence of mitral stenosis.  6. The aortic valve is tricuspid. Aortic valve regurgitation is mild. Mild aortic valve sclerosis is present, with no evidence of aortic valve stenosis.  7. The inferior vena cava is normal in size with greater than 50% respiratory variability, suggesting right atrial pressure of 3 mmHg. FINDINGS  Left Ventricle: Left ventricular ejection fraction, by estimation, is 50 to 55%. The left ventricle has low normal function. The left ventricle demonstrates regional wall motion abnormalities. The left ventricular internal cavity size was normal in size. There is mild left ventricular hypertrophy. Left ventricular diastolic parameters are consistent with Grade II diastolic  dysfunction (pseudonormalization). Right Ventricle: The right ventricular size is normal.Right ventricular systolic function is normal. There is mildly elevated pulmonary artery systolic pressure. The tricuspid regurgitant velocity is 2.89 m/s, and with  an assumed right atrial pressure of  3 mmHg, the estimated right ventricular systolic pressure is 16.1 mmHg. Left Atrium: Left atrial size was severely dilated. Right Atrium: Right atrial size was normal in size. Pericardium: There is no evidence of pericardial effusion. Mitral Valve: The mitral valve is normal in structure and function. There is mild thickening of the mitral valve leaflet(s). Normal mobility of the mitral valve leaflets. Moderate mitral valve regurgitation. No evidence of mitral valve stenosis. Tricuspid Valve: The tricuspid valve is normal in structure. Tricuspid valve regurgitation is mild . No evidence of tricuspid stenosis. Aortic Valve: The aortic valve is tricuspid. Aortic valve regurgitation is mild. Aortic regurgitation PHT measures 316 msec. Mild aortic valve sclerosis is present, with no evidence of aortic valve stenosis. Aortic valve mean gradient measures 6.0 mmHg. Aortic valve peak gradient measures 12.1 mmHg. Aortic valve area, by VTI measures 1.88 cm. Pulmonic Valve: The pulmonic valve was not well visualized. Pulmonic valve regurgitation is not visualized. No evidence of pulmonic stenosis. Aorta: The aortic root is normal in size and structure. Venous: The inferior vena cava is normal in size with greater than 50% respiratory variability, suggesting right atrial pressure of 3 mmHg. IAS/Shunts: No atrial level shunt detected by color flow Doppler. Additional Comments: Akinesis of the inferolateral wall with overall low normal LV function; Grade 2 diastolic dysfunction; mild LVH; mild AI; moderate MR; severe LAE; mild TR.  LEFT VENTRICLE PLAX 2D LVIDd:         4.90 cm  Diastology LVIDs:         3.50 cm  LV e' lateral:   12.60 cm/s LV PW:         1.40 cm  LV E/e' lateral: 9.0 LV IVS:        1.20 cm  LV e' medial:    7.07 cm/s LVOT diam:     2.00 cm  LV E/e' medial:  16.0 LV SV:         57 LV SV Index:   30 LVOT Area:     3.14 cm  RIGHT VENTRICLE             IVC RV Basal diam:  3.00  cm     IVC diam: 2.20 cm RV S prime:     12.60 cm/s TAPSE (M-mode): 1.8 cm LEFT ATRIUM             Index       RIGHT ATRIUM           Index LA diam:        4.70 cm 2.48 cm/m  RA Area:     17.40 cm LA Vol (A2C):   91.8 ml 48.49 ml/m RA Volume:   43.30 ml  22.87 ml/m LA Vol (A4C):   96.2 ml 50.82 ml/m LA Biplane Vol: 95.0 ml 50.18 ml/m  AORTIC VALVE AV Area (Vmax):    1.79 cm AV Area (Vmean):   1.60 cm AV Area (VTI):     1.88 cm AV Vmax:           174.00 cm/s AV Vmean:          116.000 cm/s AV VTI:            0.306 m AV Peak Grad:      12.1  mmHg AV Mean Grad:      6.0 mmHg LVOT Vmax:         99.40 cm/s LVOT Vmean:        58.900 cm/s LVOT VTI:          0.183 m LVOT/AV VTI ratio: 0.60 AI PHT:            316 msec  AORTA Ao Root diam: 3.00 cm MITRAL VALVE                 TRICUSPID VALVE MV Area (PHT): 4.17 cm      TR Peak grad:   33.4 mmHg MV Decel Time: 182 msec      TR Vmax:        289.00 cm/s MR Peak grad:    65.6 mmHg MR Mean grad:    42.0 mmHg   SHUNTS MR Vmax:         405.00 cm/s Systemic VTI:  0.18 m MR Vmean:        305.0 cm/s  Systemic Diam: 2.00 cm MR PISA:         3.08 cm MR PISA Eff ROA: 23 mm MR PISA Radius:  0.70 cm MV E velocity: 113.00 cm/s MV A velocity: 90.60 cm/s MV E/A ratio:  1.25 Kirk Ruths MD Electronically signed by Kirk Ruths MD Signature Date/Time: 09/02/2019/1:05:17 PM    Final    [place] LT UPPER EXTREM LTD SOFT TISSUE NON VASCULAR  Result Date: 09/02/2019 CLINICAL DATA:  Left arm swelling. Technologist notes state question hematoma in left antecubital fossa. Patient has AV fistula in the left upper extremity. EXAM: ULTRASOUND LEFT UPPER EXTREMITY LIMITED TECHNIQUE: Ultrasound examination of the upper extremity soft tissues was performed in the area of clinical concern. COMPARISON:  None. FINDINGS: Within the area of clinical concern in the antecubital fossa is a complex 4.1 x 3.0 x 3.8 cm rounded lesion that is hypoechoic to surrounding subcutaneous tissues. This is adjacent to  a prominent subcutaneous vessel but no internal blood flow. Adjacent vessel demonstrates flow but was not directly evaluated. There is subcutaneous edema adjacent to this structure. IMPRESSION: Heterogeneous hypoechoic 4.1 cm lesion adjacent to a prominent vessel in the left antecubital fossa in the area of clinical concern. There is no internal blood flow. Findings may represent hematoma in the appropriate clinical setting. Continued clinical follow-up is recommended. Completely thrombosed aneurysm is not entirely excluded, but felt less likely. Electronically Signed   By: Keith Rake M.D.   On: 09/02/2019 00:11   2D echo that showed hypokinesia of the inferior wall.  Subjective:  Complaints feels great no further nausea. Discharge Exam: Vitals:   09/07/19 1203 09/07/19 1253  BP: (!) 101/47 (!) 112/47  Pulse: 68 73  Resp: 20   Temp: (!) 96.4 F (35.8 C) 97.6 F (36.4 C)  SpO2: 97% 100%   Vitals:   09/07/19 1139 09/07/19 1200 09/07/19 1203 09/07/19 1253  BP: (!) 93/52 (!) 86/40 (!) 101/47 (!) 112/47  Pulse: 80 68 68 73  Resp:   20   Temp:   (!) 96.4 F (35.8 C) 97.6 F (36.4 C)  TempSrc:   Oral Oral  SpO2:   97% 100%  Weight:   74.1 kg   Height:        General: Pt is alert, awake, not in acute distress Cardiovascular: RRR, S1/S2 +, no rubs, no gallops Respiratory: CTA bilaterally, no wheezing, no rhonchi Abdominal: Soft, NT, ND, bowel sounds + Extremities: no edema,  no cyanosis    The results of significant diagnostics from this hospitalization (including imaging, microbiology, ancillary and laboratory) are listed below for reference.     Microbiology: Recent Results (from the past 240 hour(s))  Respiratory Panel by RT PCR (Flu A&B, Covid) - Nasopharyngeal Swab     Status: None   Collection Time: 09/01/19  5:16 PM   Specimen: Nasopharyngeal Swab  Result Value Ref Range Status   SARS Coronavirus 2 by RT PCR NEGATIVE NEGATIVE Final    Comment: (NOTE) SARS-CoV-2  target nucleic acids are NOT DETECTED. The SARS-CoV-2 RNA is generally detectable in upper respiratoy specimens during the acute phase of infection. The lowest concentration of SARS-CoV-2 viral copies this assay can detect is 131 copies/mL. A negative result does not preclude SARS-Cov-2 infection and should not be used as the sole basis for treatment or other patient management decisions. A negative result may occur with  improper specimen collection/handling, submission of specimen other than nasopharyngeal swab, presence of viral mutation(s) within the areas targeted by this assay, and inadequate number of viral copies (<131 copies/mL). A negative result must be combined with clinical observations, patient history, and epidemiological information. The expected result is Negative. Fact Sheet for Patients:  PinkCheek.be Fact Sheet for Healthcare Providers:  GravelBags.it This test is not yet ap proved or cleared by the [place] FDA and  has been authorized for detection and/or diagnosis of SARS-CoV-2 by FDA under an Emergency Use Authorization (EUA). This EUA will remain  in effect (meaning this test can be used) for the duration of the COVID-19 declaration under Section 564(b)(1) of the Act, 21 U.S.C. section 360bbb-3(b)(1), unless the authorization is terminated or revoked sooner.    Influenza A by PCR NEGATIVE NEGATIVE Final   Influenza B by PCR NEGATIVE NEGATIVE Final    Comment: (NOTE) The Xpert Xpress SARS-CoV-2/FLU/RSV assay is intended as an aid in  the diagnosis of influenza from Nasopharyngeal swab specimens and  should not be used as a sole basis for treatment. Nasal washings and  aspirates are unacceptable for Xpert Xpress SARS-CoV-2/FLU/RSV  testing. Fact Sheet for Patients: PinkCheek.be Fact Sheet for Healthcare Providers: GravelBags.it This test is  not yet approved or cleared by the [place] FDA and  has been authorized for detection and/or diagnosis of SARS-CoV-2 by  FDA under an Emergency Use Authorization (EUA). This EUA will remain  in effect (meaning this test can be used) for the duration of the  Covid-19 declaration under Section 564(b)(1) of the Act, 21  U.S.C. section 360bbb-3(b)(1), unless the authorization is  terminated or revoked. Performed at [facility], [address], [place], [place] [address]   MRSA PCR Screening     Status: None   Collection Time: 09/01/19 11:10 PM   Specimen: Nasal Mucosa; Nasopharyngeal  Result Value Ref Range Status   MRSA by PCR NEGATIVE NEGATIVE Final    Comment:        The GeneXpert MRSA Assay (FDA approved for NASAL specimens only), is one component of a comprehensive MRSA colonization surveillance program. It is not intended to diagnose MRSA infection nor to guide or monitor treatment for MRSA infections. Performed at [facility], [place] [address], [place],  [address]      Labs: BNP (last 3 results) Recent Labs    10/26/18 1641 07/16/19 0700  BNP 859.8* 2,122.4*   Basic Metabolic Panel: Recent Labs  Lab 09/04/19 0449 09/04/19 0449 09/05/19 0458 09/05/19 8250 09/06/19 0330 09/07/19 0370 09/07/19 1354  NA 135  --  137  --  135 133* 136  K 4.5   < > 5.7* 5.8* 4.7 4.3 3.6  CL 98  --  96*  --  95* 96* 97*  CO2 23  --  26  --  28 21* 30  GLUCOSE 82  --  83  --  100* 114* 90  BUN 28*  --  39*  --  20 31* 10  CREATININE 5.51*  --  7.42*  --  4.61* 6.23* 3.23*  CALCIUM 7.6*  --  7.9*  --  7.7* 8.0* 7.8*  PHOS 6.6*  --  8.2*  --  5.4* 7.3* 3.5   < > = values in this interval not displayed.   Liver Function Tests: Recent Labs  Lab 09/01/19 1308 09/03/19 0520 09/04/19 0449 09/05/19 0458 09/06/19 0330 09/07/19 0812 09/07/19 1354  AST 36  --   --   --   --   --   --   ALT 12  --   --   --   --   --   --   ALKPHOS 60  --   --   --   --   --   --    BILITOT 0.7  --   --   --   --   --   --   PROT 6.0*  --   --   --   --   --   --   ALBUMIN 2.5*   < > 2.0* 2.1* 1.9* 2.1* 2.1*   < > = values in this interval not displayed.   No results for input(s): LIPASE, AMYLASE in the last 168 hours. No results for input(s): AMMONIA in the last 168 hours. CBC: Recent Labs  Lab 09/03/19 0520 09/03/19 0520 09/04/19 0449 09/05/19 0458 09/06/19 0330 09/07/19 0811 09/07/19 1354  WBC 6.3   < > 6.8 7.6 5.4 5.3 6.0  NEUTROABS 3.4  --  4.2 4.3 2.9 3.1  --   HGB 8.7*   < > 9.2* 8.9* 8.5* 9.5* 10.0*  HCT 27.1*   < > 28.5* 28.2* 26.7* 29.6* 31.3*  MCV 90.9   < > 92.5 90.7 90.5 91.6 89.4  PLT 158   < > 149* 168 147* 171 163   < > = values in this interval not displayed.   Cardiac Enzymes: No results for input(s): CKTOTAL, CKMB, CKMBINDEX, TROPONINI in the last 168 hours. BNP: Invalid input(s): POCBNP CBG: Recent Labs  Lab 09/07/19 0652 09/07/19 0718 09/07/19 0811 09/07/19 1251 09/07/19 1642  GLUCAP 58* 63* 110* 74 72   D-Dimer No results for input(s): DDIMER in the last 72 hours. Hgb A1c No results for input(s): HGBA1C in the last 72 hours. Lipid Profile Recent Labs    09/07/19 1354  CHOL 108  HDL 24*  LDLCALC 65  TRIG 96  CHOLHDL 4.5   Thyroid function studies No results for input(s): TSH, T4TOTAL, T3FREE, THYROIDAB in the last 72 hours.  Invalid input(s): FREET3 Anemia work up No results for input(s): VITAMINB12, FOLATE, FERRITIN, TIBC, IRON, RETICCTPCT in the last 72 hours. Urinalysis    Component Value Date/Time   COLORURINE YELLOW 11/05/2018 0601   APPEARANCEUR CLEAR 11/05/2018 0601   LABSPEC 1.012 11/05/2018 0601   PHURINE 7.0 11/05/2018 0601   GLUCOSEU NEGATIVE 11/05/2018 0601   HGBUR LARGE (A) 11/05/2018 0601   BILIRUBINUR NEGATIVE 11/05/2018 0601   KETONESUR NEGATIVE 11/05/2018 0601   PROTEINUR 100 (A) 11/05/2018 0601  NITRITE NEGATIVE 11/05/2018 0601   LEUKOCYTESUR NEGATIVE 11/05/2018 0601   Sepsis  Labs Invalid input(s): PROCALCITONIN,  WBC,  LACTICIDVEN Microbiology Recent Results (from the past 240 hour(s))  Respiratory Panel by RT PCR (Flu A&B, Covid) - Nasopharyngeal Swab     Status: None   Collection Time: 09/01/19  5:16 PM   Specimen: Nasopharyngeal Swab  Result Value Ref Range Status   SARS Coronavirus 2 by RT PCR NEGATIVE NEGATIVE Final    Comment: (NOTE) SARS-CoV-2 target nucleic acids are NOT DETECTED. The SARS-CoV-2 RNA is generally detectable in upper respiratoy specimens during the acute phase of infection. The lowest concentration of SARS-CoV-2 viral copies this assay can detect is 131 copies/mL. A negative result does not preclude SARS-Cov-2 infection and should not be used as the sole basis for treatment or other patient management decisions. A negative result may occur with  improper specimen collection/handling, submission of specimen other than nasopharyngeal swab, presence of viral mutation(s) within the areas targeted by this assay, and inadequate number of viral copies (<131 copies/mL). A negative result must be combined with clinical observations, patient history, and epidemiological information. The expected result is Negative. Fact Sheet for Patients:  PinkCheek.be Fact Sheet for Healthcare Providers:  GravelBags.it This test is not yet ap proved or cleared by the [place] FDA and  has been authorized for detection and/or diagnosis of SARS-CoV-2 by FDA under an Emergency Use Authorization (EUA). This EUA will remain  in effect (meaning this test can be used) for the duration of the COVID-19 declaration under Section 564(b)(1) of the Act, 21 U.S.C. section 360bbb-3(b)(1), unless the authorization is terminated or revoked sooner.    Influenza A by PCR NEGATIVE NEGATIVE Final   Influenza B by PCR NEGATIVE NEGATIVE Final    Comment: (NOTE) The Xpert Xpress SARS-CoV-2/FLU/RSV assay is  intended as an aid in  the diagnosis of influenza from Nasopharyngeal swab specimens and  should not be used as a sole basis for treatment. Nasal washings and  aspirates are unacceptable for Xpert Xpress SARS-CoV-2/FLU/RSV  testing. Fact Sheet for Patients: PinkCheek.be Fact Sheet for Healthcare Providers: GravelBags.it This test is not yet approved or cleared by the [place] FDA and  has been authorized for detection and/or diagnosis of SARS-CoV-2 by  FDA under an Emergency Use Authorization (EUA). This EUA will remain  in effect (meaning this test can be used) for the duration of the  Covid-19 declaration under Section 564(b)(1) of the Act, 21  U.S.C. section 360bbb-3(b)(1), unless the authorization is  terminated or revoked. Performed at [facility], [address] [place], [place] [address]   MRSA PCR Screening     Status: None   Collection Time: 09/01/19 11:10 PM   Specimen: Nasal Mucosa; Nasopharyngeal  Result Value Ref Range Status   MRSA by PCR NEGATIVE NEGATIVE Final    Comment:        The GeneXpert MRSA Assay (FDA approved for NASAL specimens only), is one component of a comprehensive MRSA colonization surveillance program. It is not intended to diagnose MRSA infection nor to guide or monitor treatment for MRSA infections. Performed at [facility], [place] [address] [place], [place] [address]      Time coordinating discharge: Over 30 minutes  SIGNED:   Charlynne Cousins, MD  [facility] 09/07/2019, 5:48 PM Pager   If 7PM-7AM, please contact night-coverage [email or web address] Password [email or web address]

## 2019-09-07 NOTE — TOC Initial Note (Signed)
Transition of Care (TOC) - Initial/Assessment Note    Patient Details  Name: [name]. MRN: [id number] Date of Birth: 28-Sep-1945  Transition of Care [facility]) CM/SW Contact:    Bethena Roys, RN Phone Number: 09/07/2019, 4:41 PM  Clinical Narrative: Patient presented for symptomatic anemia- HD TTS outpatient treatment. Patient will have an additional HD treatment on Friday- Renal Navigator scheduled this. Physical Therapy recommendations for home with home health services- Medicare.gov list provided to the patient. Patient gave verbal permission to call daughter regarding home care choice. Case Manager reached ou to daughter [name] and offered choice. [name]. Referral made to liaison to with [facility] and they can accept the patient; however RN needs to be added since patient is new to oxygen. Case Manager messaged the physician to make sure that the order was added. Durable Medical Equipment oxygen ordered via [employer] and delivered to the room. No further needs from Case Manager a t this time.           Expected Discharge Plan: [place] Barriers to Discharge: No Barriers Identified   Patient Goals and CMS Choice Patient states their goals for this hospitalization and ongoing recovery are:: "to return home" CMS Medicare.gov Compare Post Acute Care list provided to:: Patient Choice offered to / list presented to : Patient  Expected Discharge Plan and Services Expected Discharge Plan: [place] In-house Referral: NA Discharge Planning Services: CM Consult Post Acute Care Choice: Home Health, Durable Medical Equipment Living arrangements for the past 2 months: Single Family Home Expected Discharge Date: 09/07/19               DME Arranged: Oxygen DME Agency: [employer] Date DME Agency Contacted: 09/07/19 Time DME Agency Contacted: 1500 Representative spoke with at DME Agency: Thedore Mins HH Arranged: PT, OT [place] Agency:  [facility] [employer] Date [place]: 09/07/19 Time [place]: [place] Representative spoke with at [place]: [place]  Prior Living Arrangements/Services Living arrangements for the past 2 months: [place] with:: Self(Support of daughter about 5 miles away.) Patient language and need for interpreter reviewed:: Yes Do you feel safe going back to the place where you live?: Yes      Need for Family Participation in Patient Care: Yes (Comment) Care giver support system in place?: Yes (comment)   Criminal Activity/Legal Involvement Pertinent to Current Situation/Hospitalization: No - Comment as needed  Activities of Daily Living Home Assistive Devices/Equipment: Electric scooter ADL Screening (condition at time of admission) Patient's cognitive ability adequate to safely complete daily activities?: Yes Is the patient deaf or have difficulty hearing?: No Does the patient have difficulty seeing, even when wearing glasses/contacts?: No Does the patient have difficulty concentrating, remembering, or making decisions?: No Patient able to express need for assistance with ADLs?: Yes Does the patient have difficulty dressing or bathing?: No Independently performs ADLs?: No Toileting: Needs assistance Is this a change from baseline?: Change from baseline, expected to last >3days In/Out Bed: Needs assistance Is this a change from baseline?: Change from baseline, expected to last >3 days Does the patient have difficulty walking or climbing stairs?: No Weakness of Legs: Both Weakness of Arms/Hands: Both  Permission Sought/Granted Permission sought to share information with : Facility Sport and exercise psychologist, Family Supports Permission granted to share information with : Yes, Verbal Permission Granted     Permission granted to share info w AGENCY: [facility] [facility]  Permission granted to share info w Contact Information: [name]-  [phone]  Emotional Assessment Appearance:: Appears stated age Attitude/Demeanor/Rapport: Unable to Assess Affect (typically observed): Unable to Assess Orientation: : Oriented to Self, Oriented to Place, Oriented to  Time, Oriented to Situation Alcohol / Substance Use: Not Applicable Psych Involvement: No (comment)  Admission diagnosis:  Shortness of breath [R06.02] Left arm swelling [M79.89] Symptomatic anemia [D64.9] Anemia, unspecified type [D64.9] Patient Active Problem List   Diagnosis Date Noted  . Non-ST elevation (NSTEMI) myocardial infarction ([place])   . Volume overload 07/16/2019  . Reducible right inguinal hernia 07/16/2019  . Acute on chronic respiratory failure with hypoxia ([place]) 07/16/2019  . Constipation 05/31/2019  . Plantar fasciitis, bilateral 03/15/2019  . Atrial fibrillation ([place])   . Normocytic anemia 11/23/2018  . Hemoptysis   . ILD (interstitial lung disease) ([place])   . GIB (gastrointestinal bleeding) 11/05/2018  . Symptomatic anemia 11/04/2018  . DVT (deep venous thrombosis) ([place]) 10/26/2018  . GERD (gastroesophageal reflux disease) 10/26/2018  . ESRD on dialysis ([place]) 10/26/2018  . Pancytopenia ([place]) 10/26/2018  . Elevated troponin 10/26/2018  . Hypomagnesemia 10/26/2018  . Uremia 10/08/2018  . History of bladder cancer 10/06/2018  . Type II diabetes mellitus with renal manifestations ([place]) 10/06/2018  . CAD (coronary artery disease) 10/06/2018  . ATN (acute tubular necrosis) ([place]) 10/05/2018  . 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 Thoracic disc disease with myelopathy   . Allergic reaction 02/21/2018  . Perennial and seasonal allergic rhinitis 02/21/2018  . Angioedema 02/21/2018  . COPD (chronic obstructive pulmonary disease) ([place]) 02/21/2018  . Bradycardia 01/27/2011  . Carotid artery stenosis 10/31/2009  . ERECTILE DYSFUNCTION, ORGANIC 01/24/2009  . Hyperlipidemia 10/08/2008  . TOBACCO  ABUSE 10/08/2008  . HYPERTENSION, BENIGN 10/08/2008  . CAD, NATIVE VESSEL 10/08/2008  . PVD 10/08/2008  . BRUIT 10/08/2008  . Essential hypertension 10/05/2008  . Chronic back pain 10/05/2008   PCP:  [name], MD Pharmacy:   [id number] - [place], [place] [address] [place] [address] Phone: [phone] Fax: [phone]     Social Determinants of Health (SDOH) Interventions    Readmission Risk Interventions Readmission Risk Prevention Plan 09/07/2019 10/20/2018  Transportation Screening Complete Complete  Medication Review (RN Care Manager) Complete Complete  PCP or Specialist appointment within 3-5 days of discharge Complete Not Complete  PCP/Specialist Appt Not Complete comments - office to call and schedule  HRI or [place] Complete Complete  SW Recovery Care/Counseling Consult Complete Complete  Palliative Care Screening Not Applicable Not [place] Not Applicable Not Applicable  Some recent data might be hidden

## 2019-09-07 NOTE — Progress Notes (Signed)
Physical Therapy Treatment Patient Details Name: [name]. MRN: [id number] DOB: 09-18-1945 Today's Date: 09/07/2019    History of Present Illness [name]  is a 74 y.o. male  with a history of ESRD on dialysis T/Th/Sat,HTN, CAD, COPD, interstitial lung disease, inoperable hernia, bladder cancer (2009), T2DM,  L eye enucleation; afib, & prior VTE who underwent Lt UE AV fistula repair on 08/30/2019. Now presenting with increased left upper arm swelling and bruising, shortness of breath and found to have hemoglobin of 6.9 and elevated troponins.    PT Comments    Pt did much better with mobility this am was better able to tolerate activity with increased independence. Pt still limited in use of RUE but able to use LUE to compensate. Pt able to stand with min a and HHA, reports no dizziness and no signs of symptoms noted visually. Pt ambulated same distance as last session but was better able to tolerate with better balance and coordination. Pt has HD today. Will continue to follow acutely and progress as he tolerates.      Follow Up Recommendations  Home health PT     Equipment Recommendations  Other (comment)    Recommendations for Other Services       Precautions / Restrictions Precautions Precautions: Fall Precaution Comments: has hernia that will come out with increased standing, has hernia belt in room Restrictions Weight Bearing Restrictions: No    Mobility  Bed Mobility Overal bed mobility: Needs Assistance Bed Mobility: Supine to Sit;Sit to Supine     Supine to sit: Min assist Sit to supine: Min guard   General bed mobility comments: better able to get in and out of bed this am, still restricted in use of RUE but does better engaging LUE  Transfers Overall transfer level: Needs assistance Equipment used: 1 person hand held assist Transfers: Sit to/from Stand Sit to Stand: Min assist         General transfer comment: stands with better balance and  tolerance is not swaying as last session  Ambulation/Gait Ambulation/Gait assistance: Min assist Gait Distance (Feet): 40 Feet Assistive device: 1 person hand held assist Gait Pattern/deviations: Wide base of support;Shuffle;Staggering right;Staggering left Gait velocity: slowed   General Gait Details: ambulated same distance as previous session but better balance and coordination this time, was able to ambulate distance on RA and sats in90s at end   Stairs             Wheelchair Mobility    Modified Rankin (Stroke Patients Only)       Balance Overall balance assessment: Needs assistance Sitting-balance support: Feet supported Sitting balance-Leahy Scale: Good Sitting balance - Comments: sits unsupported edge of bed and able to put peanut butter on crackers and eat without losss of balance   Standing balance support: During functional activity;Single extremity supported Standing balance-Leahy Scale: Poor                              Cognition Arousal/Alertness: Awake/alert Behavior During Therapy: WFL for tasks assessed/performed Overall Cognitive Status: No family/caregiver present to determine baseline cognitive functioning                                 General Comments: found attempting to eat peanut butter and crackers bloos sugar was low this am      Exercises  General Comments        Pertinent Vitals/Pain Pain Assessment: Faces Faces Pain Scale: No hurt    Home Living                      Prior Function            PT Goals (current goals can now be found in the care plan section) Acute Rehab PT Goals Patient Stated Goal: states feels better than last session less dizzyness PT Goal Formulation: With patient Time For Goal Achievement: 09/17/19 Potential to Achieve Goals: Good Progress towards PT goals: Progressing toward goals    Frequency    Min 3X/week      PT Plan Current plan remains  appropriate    Co-evaluation              AM-PAC PT "6 Clicks" Mobility   Outcome Measure  Help needed turning from your back to your side while in a flat bed without using bedrails?: A Little Help needed moving from lying on your back to sitting on the side of a flat bed without using bedrails?: A Little Help needed moving to and from a bed to a chair (including a wheelchair)?: A Little Help needed standing up from a chair using your arms (e.g., wheelchair or bedside chair)?: A Little Help needed to walk in hospital room?: A Little Help needed climbing 3-5 steps with a railing? : A Lot 6 Click Score: 17    End of Session Equipment Utilized During Treatment: Gait belt;Oxygen Activity Tolerance: Patient limited by fatigue;Patient limited by lethargy Patient left: in bed;with call bell/phone within reach Nurse Communication: Mobility status PT Visit Diagnosis: Unsteadiness on feet (R26.81);Other abnormalities of gait and mobility (R26.89);Muscle weakness (generalized) (M62.81)     Time: 4709-6283 PT Time Calculation (min) (ACUTE ONLY): 14 min  Charges:  $Gait Training: 8-22 mins                     Horald Chestnut, PT    Delford Field 09/07/2019, 9:54 AM

## 2019-09-07 NOTE — Progress Notes (Signed)
Lab tech advised unable to draw labs at this time due to left arm restricted due to fistula and right arm restricted for 24 hrs due to heart catheterization.

## 2019-09-07 NOTE — Progress Notes (Signed)
CARDIAC REHAB PHASE I   PRE:  Rate/Rhythm: 72 1st Deg HB  BP:  Sitting: 102/51      SaO2: 95 RA  MODE:  Ambulation: 40 ft   POST:  Rate/Rhythm: 89 1st Deg HB  BP:  Sitting: 118/54    SaO2: 87 RA --> 92 RA  Pt ambulated 34ft assist x2 with front wheel walker and gait belt. Pt c/o leg weakness and some SOB. Sats briefly dropped to 87 on RA but quickly recovered with rest. CM and RN made aware of sats. Pt educated on importance of ASA and Plavix. Pt states his daughter is in charge of his medicine and checks in daily. Pt also states he uses a hover-round at home and his sister is in town until he gets stronger. Reviewed restrictions, encouraged ambulation as able with emphasis on safety. Pt referred to CRP II [place], pt states he does not think he will be able to participate due to limited mobility and shortness of breath.  [id number] Rufina Falco, RN BSN 09/07/2019 1:33 PM

## 2019-09-07 NOTE — Progress Notes (Addendum)
[facility] Progress Note   Subjective:   Patient seen on HD. Ongoing O2 requirement. Patient reports stable SOB. Denies CP, palpitations, abdominal pain, N/V/D. BP in the 16'W systolic.   Objective Vitals:   09/07/19 0800 09/07/19 0830 09/07/19 0900 09/07/19 0930  BP: (!) 114/58 (!) 92/48 (!) 84/45 (!) 92/49  Pulse: 69 71 68 68  Resp:      Temp:      TempSrc:      SpO2:      Weight:      Height:       Physical Exam General: Well developed, chronically ill appearing male. Alert and in NAD Heart: RRR, no murmurs, rubs or gallops Lungs: CTA b/l without wheezing, rhonchi or rales Abdomen: Soft, non-tender, non-distended. +BS Extremities: No edema b/l lower extremities Dialysis Access: RUE avf + bruit, improving hematoma and edema  Additional Objective Labs: Basic Metabolic Panel: Recent Labs  Lab 09/05/19 0458 09/05/19 0458 09/05/19 0839 09/06/19 0330 09/07/19 0812  NA 137  --   --  135 133*  K 5.7*   < > 5.8* 4.7 4.3  CL 96*  --   --  95* 96*  CO2 26  --   --  28 21*  GLUCOSE 83  --   --  100* 114*  BUN 39*  --   --  20 31*  CREATININE 7.42*  --   --  4.61* 6.23*  CALCIUM 7.9*  --   --  7.7* 8.0*  PHOS 8.2*  --   --  5.4* 7.3*   < > = values in this interval not displayed.   Liver Function Tests: Recent Labs  Lab 09/01/19 1308 09/03/19 0520 09/05/19 0458 09/06/19 0330 09/07/19 0812  AST 36  --   --   --   --   ALT 12  --   --   --   --   ALKPHOS 60  --   --   --   --   BILITOT 0.7  --   --   --   --   PROT 6.0*  --   --   --   --   ALBUMIN 2.5*   < > 2.1* 1.9* 2.1*   < > = values in this interval not displayed.   CBC: Recent Labs  Lab 09/03/19 0520 09/03/19 0520 09/04/19 0449 09/04/19 0449 09/05/19 0458 09/06/19 0330 09/07/19 0811  WBC 6.3   < > 6.8   < > 7.6 5.4 5.3  NEUTROABS 3.4   < > 4.2   < > 4.3 2.9 3.1  HGB 8.7*   < > 9.2*   < > 8.9* 8.5* 9.5*  HCT 27.1*   < > 28.5*   < > 28.2* 26.7* 29.6*  MCV 90.9  --  92.5  --  90.7  90.5 91.6  PLT 158   < > 149*   < > 168 147* 171   < > = values in this interval not displayed.   Blood Culture    Component Value Date/Time   SDES URINE, RANDOM 11/05/2018 0602   SPECREQUEST NONE 11/05/2018 0602   CULT  11/05/2018 0602    NO GROWTH Performed at [facility], [place] [address] [place], [place] [address]    REPTSTATUS 11/06/2018 FINAL 11/05/2018 0602    CBG: Recent Labs  Lab 09/06/19 1618 09/06/19 2116 09/07/19 0652 09/07/19 0718 09/07/19 0811  GLUCAP 97 112* 58* 63* 110*    Studies/Results: CARDIAC  CATHETERIZATION  Result Date: 09/06/2019  Mid LAD lesion is 30% stenosed with 25% stenosed side branch in 2nd Diag.  Mid RCA to Dist RCA lesion is 90% stenosed.  Mid RCA lesion is 35% stenosed.  Prox RCA to Mid RCA lesion is 50% stenosed with 30% stenosed side branch in RV Branch.  Prox RCA lesion is 40% stenosed.  Post intervention, there is a 0% residual stenosis.  Post intervention, there is a 0% residual stenosis.  A drug-eluting stent was successfully placed using a STENT RESOLUTE ONYX 3.5X22.  Successful orbital atherectomy and drug-eluting stent placement to mid/distal right coronary artery. Recommendations: Given the patient's anemia, I do not think he will tolerate resumption of anticoagulation at the present time.  Recommend treatment with dual antiplatelet therapy with aspirin and clopidogrel for at least 6 months and if the patient's anemia is improved then, resuming anticoagulation can be considered with stopping aspirin. Continue aggressive treatment of risk factors.   CARDIAC CATHETERIZATION  Result Date: 09/05/2019  The left ventricular systolic function is normal. The left ventricular ejection fraction is 50-55% by visual estimate -apparent inferior hypokinesis  LV end diastolic pressure is moderate-severely elevated.  Mid RCA to Dist RCA lesion is 90% stenosed. (Heavily calcified focal lesion)  Prox RCA lesion is 40% stenosed. Prox RCA to  Mid RCA lesion is 50% stenosed with 30% stenosed side branch in RV Branch. Mid RCA lesion is 35% stenosed.  Mid LAD lesion is 30% stenosed with 25% stenosed side branch in 2nd Diag.  SUMMARY  Distal RCA heavily calcified 90% lesion with moderate calcified disease upstream.  Minimal LCA disease.  Moderate to severely elevated LVEDP despite systemic hypotension postdialysis. RECOMMENDATIONS  Patient has a severe calcified lesion in the RCA needs to be treated with atherectomy followed by PCI.  We will plan staged atherectomy based PCI by Dr. Fletcher Anon on 09/06/2018 1 in the afternoon.  (Precath orders written)  We will load with 300 mg Plavix tonight and set her milligrams in the morning tomorrow. ->   Plan will be to use Plavix plus DOAC on discharge (potentially aspirin for 1 month)  Continue aggressive GDMT for CAD-already on statin.  Blood pressure likely not able to tolerate beta-blocker. Glenetta Hew, MD  Medications: . sodium chloride    . sodium chloride    . sodium chloride     . amiodarone  400 mg Oral BID  . aspirin EC  81 mg Oral Daily  . atorvastatin  80 mg Oral QHS  . calcitRIOL      . calcitRIOL  0.25 mcg Oral Q T,Th,Sa-HD  . calcium acetate  667 mg Oral TID WC  . Chlorhexidine Gluconate Cloth  6 each Topical Q0600  . clopidogrel  75 mg Oral Q breakfast  . ezetimibe  10 mg Oral Daily  . insulin aspart  0-5 Units Subcutaneous QHS  . insulin aspart  0-6 Units Subcutaneous TID WC  . midodrine      . midodrine  10 mg Oral TID WC  . multivitamin  1 tablet Oral QHS  . nicotine  7 mg Transdermal Daily  . sodium chloride flush  3 mL Intravenous Q12H  . sodium chloride flush  3 mL Intravenous Q12H  . sodium chloride flush  3 mL Intravenous Q12H  . tiZANidine  2 mg Oral QHS  . vitamin E  400 Units Oral Daily    [facility]Re, T 3:30 hr, BFR 400/ DFR Auto 1.5, 2K,  2.5Ca, UF Profile 2, AVF, EDW 68.5kg Calcitriol 0.25 mcg PO q  HD Mircera 50 mcg IV q 2 weeks- last dose 08/29/19 No binder  Assessment/Plan: 1. Shortness of breath: CTA negative for PE. Dyspnea likely multifactorial due toCAD,anemia and CHF with pleural effusions. Volume removal has been limited by hypotension but symptoms improved with HD. LVEDP 25 despite hypotension. metoprolol has been stopped due to hypotension, hopefully will allow for more fluid removal with HD today. Continue midodrine. Still on O2 which he does not use at home.   2. CAD: Pt with history of angioplasty and stenting. Troponin >2700. Transferred from [facility] to [facility] for further cardiology work up.New relative drop in LVEF and WMA. Cardiac cath 09/05/19: Distal RCA heavily calcified 90% lesion , underwent successful orbital atherectomy and drug-eluting stent placement to mid/distal right coronary artery on 09/06/19.  3. Left upper extremity edema:Recent LUE AVFprocedureon 3/3. Had difficulty cannulating 3/4 and patient believes needles infiltrated. LUE US showing hematoma. Vascular surgery recommending arm elevation. Fortunately edema improving and we have been able to continue to use AVF.  4. ESRD:TTS schedule. Hyperkalemic on 3/9, resolved after HD. Continue TTS schedule. 5. Anemia:Hgb 6.9 on presenation.s/p 1 unit PRBC on 3/6. Hgb improved to 9.5.FOBT negative. Continue ESA.  6. Metabolic bone VOPFYTW:KMQKMMN8.1 corrected,Continue calcitriol.Phos upto 8.2, not on phos binder as outpatientbut was on calcium acetate previously, restarted. Phos 7.3 today. Follow calcium. 7. Nutrition:Renal diet with fluid restrictions   Anice Paganini, PA-C 09/07/2019, 9:57 AM  [facility] Pager: [phone]  Pt seen, examined and agree w A/P as above.  Kelly Splinter  MD 09/07/2019, 10:41 AM

## 2019-09-07 NOTE — Progress Notes (Addendum)
Progress Note  Patient Name: [name]. Date of Encounter: 09/07/2019  Primary Cardiologist: Dorris Carnes, MD   Subjective   Patient seen in HD. Successful orbital atherectomy and DES placement to RCA yesterday. Right radial cath site is stable. No further chest pain. Patient feels tired today.   Inpatient Medications    Scheduled Meds: . amiodarone  400 mg Oral BID  . aspirin EC  81 mg Oral Daily  . atorvastatin  80 mg Oral QHS  . calcitRIOL  0.25 mcg Oral Q T,Th,Sa-HD  . calcium acetate  667 mg Oral TID WC  . Chlorhexidine Gluconate Cloth  6 each Topical Q0600  . clopidogrel  75 mg Oral Q breakfast  . ezetimibe  10 mg Oral Daily  . insulin aspart  0-5 Units Subcutaneous QHS  . insulin aspart  0-6 Units Subcutaneous TID WC  . midodrine  10 mg Oral TID WC  . multivitamin  1 tablet Oral QHS  . nicotine  7 mg Transdermal Daily  . sodium chloride flush  3 mL Intravenous Q12H  . sodium chloride flush  3 mL Intravenous Q12H  . sodium chloride flush  3 mL Intravenous Q12H  . tiZANidine  2 mg Oral QHS  . vitamin E  400 Units Oral Daily   Continuous Infusions: . sodium chloride    . sodium chloride    . sodium chloride     PRN Meds: sodium chloride, sodium chloride, sodium chloride, acetaminophen **OR** acetaminophen, erythromycin, ondansetron (ZOFRAN) IV, ondansetron, oxyCODONE-acetaminophen **AND** oxyCODONE, polyethylene glycol, sodium chloride flush, sodium chloride flush, sodium chloride flush, traZODone   Vital Signs    Vitals:   09/06/19 1341 09/06/19 2115 09/07/19 0614 09/07/19 0618  BP: (!) 95/59 120/63 (!) 115/57   Pulse: 71 84 76   Resp: 16 (!) 22 14   Temp: 98 F (36.7 C)   (!) 97.5 F (36.4 C)  TempSrc: Oral   Oral  SpO2: 98% 97% 93%   Weight:   73.2 kg   Height:        Intake/Output Summary (Last 24 hours) at 09/07/2019 0813 Last data filed at 09/06/2019 2200 Gross per 24 hour  Intake 120 ml  Output --  Net 120 ml   Last 3 Weights 09/07/2019  09/06/2019 09/05/2019  Weight (lbs) 161 lb 4.8 oz 162 lb 7.7 oz 160 lb 0.9 oz  Weight (kg) 73.165 kg 73.7 kg 72.6 kg      Telemetry    NSR HR 70s, no other arrhythmias noted - Personally Reviewed  ECG    NSR, 71 bpm, nonspecific T wave abnormality inferior leads- Personally Reviewed  Physical Exam   GEN: No acute distress.   Neck: No JVD Cardiac: RRR, no murmurs, rubs, or gallops.  Respiratory: Clear to auscultation bilaterally;3L O2 GI: Soft, nontender, non-distended  MS: No edema; No deformity; right radial cath site without hematoma or bruit Neuro:  Nonfocal  Psych: Normal affect   Labs    High Sensitivity Troponin:   Recent Labs  Lab 09/01/19 1308 09/01/19 1428 09/03/19 1115  TROPONINIHS 2,381* 2,709* 2,321*      Chemistry Recent Labs  Lab 09/01/19 1308 09/02/19 0441 09/04/19 0449 09/04/19 0449 09/05/19 0458 09/05/19 0839 09/06/19 0330  NA 139   < > 135  --  137  --  135  K 4.5   < > 4.5   < > 5.7* 5.8* 4.7  CL 99   < > 98  --  96*  --  95*  CO2 30   < > 23  --  26  --  28  GLUCOSE 103*   < > 82  --  83  --  100*  BUN 26*   < > 28*  --  39*  --  20  CREATININE 4.97*   < > 5.51*  --  7.42*  --  4.61*  CALCIUM 8.2*   < > 7.6*  --  7.9*  --  7.7*  PROT 6.0*  --   --   --   --   --   --   ALBUMIN 2.5*   < > 2.0*  --  2.1*  --  1.9*  AST 36  --   --   --   --   --   --   ALT 12  --   --   --   --   --   --   ALKPHOS 60  --   --   --   --   --   --   BILITOT 0.7  --   --   --   --   --   --   GFRNONAA 11*   < > 9*  --  6*  --  12*  GFRAA 12*   < > 11*  --  8*  --  13*  ANIONGAP 10   < > 14  --  15  --  12   < > = values in this interval not displayed.     Hematology Recent Labs  Lab 09/04/19 0449 09/05/19 0458 09/06/19 0330  WBC 6.8 7.6 5.4  RBC 3.08* 3.11* 2.95*  HGB 9.2* 8.9* 8.5*  HCT 28.5* 28.2* 26.7*  MCV 92.5 90.7 90.5  MCH 29.9 28.6 28.8  MCHC 32.3 31.6 31.8  RDW 18.7* 18.6* 18.4*  PLT 149* 168 147*    BNPNo results for input(s):  BNP, PROBNP in the last 168 hours.   DDimer No results for input(s): DDIMER in the last 168 hours.   Radiology    CARDIAC CATHETERIZATION  Result Date: 09/06/2019  Mid LAD lesion is 30% stenosed with 25% stenosed side branch in 2nd Diag.  Mid RCA to Dist RCA lesion is 90% stenosed.  Mid RCA lesion is 35% stenosed.  Prox RCA to Mid RCA lesion is 50% stenosed with 30% stenosed side branch in RV Branch.  Prox RCA lesion is 40% stenosed.  Post intervention, there is a 0% residual stenosis.  Post intervention, there is a 0% residual stenosis.  A drug-eluting stent was successfully placed using a STENT RESOLUTE ONYX 3.5X22.  Successful orbital atherectomy and drug-eluting stent placement to mid/distal right coronary artery. Recommendations: Given the patient's anemia, I do not think he will tolerate resumption of anticoagulation at the present time.  Recommend treatment with dual antiplatelet therapy with aspirin and clopidogrel for at least 6 months and if the patient's anemia is improved then, resuming anticoagulation can be considered with stopping aspirin. Continue aggressive treatment of risk factors.   CARDIAC CATHETERIZATION  Result Date: 09/05/2019  The left ventricular systolic function is normal. The left ventricular ejection fraction is 50-55% by visual estimate -apparent inferior hypokinesis  LV end diastolic pressure is moderate-severely elevated.  Mid RCA to Dist RCA lesion is 90% stenosed. (Heavily calcified focal lesion)  Prox RCA lesion is 40% stenosed. Prox RCA to Mid RCA lesion is 50% stenosed with 30% stenosed side branch in RV Branch. Mid RCA lesion is 35% stenosed.  Mid LAD lesion is 30% stenosed with 25% stenosed side branch in 2nd Diag.  SUMMARY  Distal RCA heavily calcified 90% lesion with moderate calcified disease upstream.  Minimal LCA disease.  Moderate to severely elevated LVEDP despite systemic hypotension postdialysis. RECOMMENDATIONS  Patient has a severe  calcified lesion in the RCA needs to be treated with atherectomy followed by PCI.  We will plan staged atherectomy based PCI by Dr. Fletcher Anon on 09/06/2018 1 in the afternoon.  (Precath orders written)  We will load with 300 mg Plavix tonight and set her milligrams in the morning tomorrow. ->   Plan will be to use Plavix plus DOAC on discharge (potentially aspirin for 1 month)  Continue aggressive GDMT for CAD-already on statin.  Blood pressure likely not able to tolerate beta-blocker. Glenetta Hew, MD   Cardiac Studies   Cardiac Cath 09/06/19  Mid LAD lesion is 30% stenosed with 25% stenosed side branch in 2nd Diag.  Mid RCA to Dist RCA lesion is 90% stenosed.  Mid RCA lesion is 35% stenosed.  Prox RCA to Mid RCA lesion is 50% stenosed with 30% stenosed side branch in RV Branch.  Prox RCA lesion is 40% stenosed.  Post intervention, there is a 0% residual stenosis.  Post intervention, there is a 0% residual stenosis.  A drug-eluting stent was successfully placed using a STENT RESOLUTE ONYX 3.5X22.   Successful orbital atherectomy and drug-eluting stent placement to mid/distal right coronary artery.  Recommendations: Given the patient's anemia, I do not think he will tolerate resumption of anticoagulation at the present time.  Recommend treatment with dual antiplatelet therapy with aspirin and clopidogrel for at least 6 months and if the patient's anemia is improved then, resuming anticoagulation can be considered with stopping aspirin. Continue aggressive treatment of risk factors.   Echo 09/02/19 1. Akinesis of the inferolateral wall with overall low normal LV  function; Grade 2 diastolic dysfunction; mild LVH; mild AI; moderate MR;  severe LAE; mild TR.  2. Left ventricular ejection fraction, by estimation, is 50 to 55%. The  left ventricle has low normal function. The left ventricle demonstrates  regional wall motion abnormalities (see scoring diagram/findings for    description). There is mild left  ventricular hypertrophy. Left ventricular diastolic parameters are  consistent with Grade II diastolic dysfunction (pseudonormalization).  3. Right ventricular systolic function is normal. The right ventricular  size is normal. There is mildly elevated pulmonary artery systolic  pressure.  4. Left atrial size was severely dilated.  5. The mitral valve is normal in structure and function. Moderate mitral  valve regurgitation. No evidence of mitral stenosis.  6. The aortic valve is tricuspid. Aortic valve regurgitation is mild.  Mild aortic valve sclerosis is present, with no evidence of aortic valve  stenosis.  7. The inferior vena cava is normal in size with greater than 50%  respiratory variability, suggesting right atrial pressure of 3 mmHg.    Patient Profile     74 y.o. male with a hx of coronary artery disease s/p BMS to RCA in 2007 (negative Myoview in 2010), PVD (occluded abd Aorta >> s/p Ax-Fem bypass), carotid artery disease, ESRD on dialysis, prior UGI bleed, prior DVT, atrial fibrillation, chronicanticoagulationwith Apixaban, COPD, hypertension, hyperlipidemia, diastolic CHFwho is being seen today for the evaluation of an elevated Troponin.  Assessment & Plan    NSTEMI/CAD with prior BMS to RCA in 2017 - troponin peaked around 2700 - admitted with significant anemia. He is s/p 1unit pRBCs. Hgb  has been 8-9 range. Hgb today 9.5 - Echo in Jan 2020 showed EF 60-65%, normal WM - Patient underwent cath 3/9 showing severe RCA disease. He was loaded with plavix and staged PCI was planned for 3/10. The next day RCA was treated with orbital atherectomy and DES to the mid/distal RCA. Cath showed residual disease in the LAD.  - Plan for DAPT with Aspirin and Plavix for at least 6 months. At that time will have to re-evaluate initiation of Eliquis given anemia.  - cath site right radial stable  - Likely discharge today  Anemia s/p 1 unit  pRBC - Hgb baseline is 8-9 - Hgb today 9.5  PAF - eliquis held for severe anemia - will re-evaulate initiation of Eliquis in 6 months given severe anemia  ESRD - HD per nephrology  HLD - continue statin  For questions or updates, please contact [place] Please consult [email or web address] for contact info under     Signed, Cadence Ninfa Meeker, PA-C  09/07/2019, 8:13 AM     The patient was seen, examined and discussed with Cadence Ninfa Meeker, PA-C   and I agree with the above.   The patient underwent RCA  orbital atherectomy and DES to the mid/distal RCA. Cath showed mild nonobstructive disease in the LAD, no disease in the left circumflex artery.   He is chest pain-free today, hemoglobin is stable at 9.5, he is undergoing hemodialysis this morning, he can be discharged afterwards.  We will arrange for an outpatient follow-up.  He was hypotensive yesterday however blood pressure improved this morning.  Ena Dawley, MD 09/07/2019

## 2019-09-07 NOTE — Progress Notes (Signed)
Per Nephrology, patient needs extra HD treatment tomorrow. Renal Navigator has arranged for patient to go to his OP HD clinic/[facility] tomorrow at 12:30pm. He needs to arrive at 12:15pm. Renal Navigator spoke with patient's step-daughter/Brenda, who provides patient's transportation to HD treatment. She is agreeable to plan and appreciative that appointment has already been scheduled for him tomorrow. She was not aware of discharge plan today, however, so Renal Navigator asked her to please contact the [facility] RN station to confirm discharge plan.  Alphonzo Cruise, [place] Renal Navigator [phone]

## 2019-09-07 NOTE — Progress Notes (Signed)
SATURATION QUALIFICATIONS: (This note is used to comply with regulatory documentation for home oxygen)  Patient Saturations on Room Air at Rest = 86%  Patient Saturations on Room Air while Ambulating = N/A  Patient Saturations on 2 Liters of oxygen while resting = 94%  Please briefly explain why patient needs home oxygen:

## 2019-09-07 NOTE — Progress Notes (Addendum)
   09/07/19 1100  OT Visit Information  Last OT Received On 09/07/19  Reason Eval/Treat Not Completed Patient at procedure or test/ unavailable. HD.  History of Present Illness [name]  is a 74 y.o. male  with a history of ESRD on dialysis T/Th/Sat,HTN, CAD, COPD, interstitial lung disease, inoperable hernia, bladder cancer (2009), T2DM,  L eye enucleation; afib, & prior VTE who underwent Lt UE AV fistula repair on 08/30/2019. Now presenting with increased left upper arm swelling and bruising, shortness of breath and found to have hemoglobin of 6.9 and elevated troponins.   Plan to reattempt at a later time.   Tyrone Schimke, OT Acute Rehabilitation Services Pager: [phone] Office: [phone]

## 2019-09-08 ENCOUNTER — Ambulatory Visit: Payer: Medicare HMO | Admitting: Internal Medicine

## 2019-09-08 ENCOUNTER — Telehealth: Payer: Self-pay | Admitting: Nurse Practitioner

## 2019-09-08 DIAGNOSIS — N186 End stage renal disease: Secondary | ICD-10-CM | POA: Diagnosis not present

## 2019-09-08 DIAGNOSIS — N2581 Secondary hyperparathyroidism of renal origin: Secondary | ICD-10-CM | POA: Diagnosis not present

## 2019-09-08 DIAGNOSIS — Z992 Dependence on renal dialysis: Secondary | ICD-10-CM | POA: Diagnosis not present

## 2019-09-08 LAB — HEPATITIS B E ANTIGEN: Hep B E Ag: NEGATIVE

## 2019-09-08 LAB — HEPATITIS B E ANTIBODY: Hep B E Ab: NEGATIVE

## 2019-09-08 NOTE — Telephone Encounter (Addendum)
**Note De-Identified [name] Obfuscation** TCM Call 1st Attempt: No answer at home # so I left a message asking the pt to call me back. I did leave my name and the office phone number in the VM.  The pts Cell phone is out of service per message.

## 2019-09-08 NOTE — Telephone Encounter (Signed)
Transition of care contact from inpatient facility  Date of discharge: 09/07/2019 Date of contact: 09/08/2019 Method: Phone Spoke to: Patient's wife  Patient contacted to discuss transition of care from recent inpatient hospitalization. Patient was admitted to [facility] from 09/01/2019 to 09/07/2019 with discharge diagnosis of Anemia on chronic anemia of chronic disease,History of coronary artery disease with elevation in troponins, Left upper extremity swelling hematoma, Acute on chronic Respiratory Failure with hypoxia, PAF.    Medication changes were reviewed. Stopped Metoprolol, started midodrine 10 mg PO TID  Patient will follow up with his/her outpatient HD unit on: 09/08/2019  Other f/u needs include: Follow up with PCP.

## 2019-09-09 DIAGNOSIS — M1990 Unspecified osteoarthritis, unspecified site: Secondary | ICD-10-CM | POA: Diagnosis not present

## 2019-09-09 DIAGNOSIS — N2581 Secondary hyperparathyroidism of renal origin: Secondary | ICD-10-CM | POA: Diagnosis not present

## 2019-09-09 DIAGNOSIS — N186 End stage renal disease: Secondary | ICD-10-CM | POA: Diagnosis not present

## 2019-09-09 DIAGNOSIS — J449 Chronic obstructive pulmonary disease, unspecified: Secondary | ICD-10-CM | POA: Diagnosis not present

## 2019-09-09 DIAGNOSIS — I739 Peripheral vascular disease, unspecified: Secondary | ICD-10-CM | POA: Diagnosis not present

## 2019-09-09 DIAGNOSIS — Z992 Dependence on renal dialysis: Secondary | ICD-10-CM | POA: Diagnosis not present

## 2019-09-10 DIAGNOSIS — I2510 Atherosclerotic heart disease of native coronary artery without angina pectoris: Secondary | ICD-10-CM | POA: Diagnosis not present

## 2019-09-10 DIAGNOSIS — E1122 Type 2 diabetes mellitus with diabetic chronic kidney disease: Secondary | ICD-10-CM | POA: Diagnosis not present

## 2019-09-10 DIAGNOSIS — T82838D Hemorrhage of vascular prosthetic devices, implants and grafts, subsequent encounter: Secondary | ICD-10-CM | POA: Diagnosis not present

## 2019-09-10 DIAGNOSIS — I132 Hypertensive heart and chronic kidney disease with heart failure and with stage 5 chronic kidney disease, or end stage renal disease: Secondary | ICD-10-CM | POA: Diagnosis not present

## 2019-09-10 DIAGNOSIS — D631 Anemia in chronic kidney disease: Secondary | ICD-10-CM | POA: Diagnosis not present

## 2019-09-10 DIAGNOSIS — I5030 Unspecified diastolic (congestive) heart failure: Secondary | ICD-10-CM | POA: Diagnosis not present

## 2019-09-10 DIAGNOSIS — N186 End stage renal disease: Secondary | ICD-10-CM | POA: Diagnosis not present

## 2019-09-10 DIAGNOSIS — I480 Paroxysmal atrial fibrillation: Secondary | ICD-10-CM | POA: Diagnosis not present

## 2019-09-10 DIAGNOSIS — T82898D Other specified complication of vascular prosthetic devices, implants and grafts, subsequent encounter: Secondary | ICD-10-CM | POA: Diagnosis not present

## 2019-09-11 NOTE — Telephone Encounter (Signed)
Patient contacted regarding discharge from [facility] on 09/07/19.  Patient understands to follow up with provider Daune Perch NP on 09/18/19 at 2:15 pm at [place]. Patient understands discharge instructions? yes Patient understands medications and regiment? yes Patient understands to bring all medications to this visit? yes  Ask patient:  Are you enrolled in My Chart (yes or no)  If no ask patient if they would like to enroll.    Pt had [place] nurse with [facility] come to the house 09/10/19 and reported his site was healing "well"... she says he is feeling well and gaining back strength each day. He has been eating and drinking well. Will call of they have any questions or problems prior the follow up appt.

## 2019-09-12 DIAGNOSIS — N186 End stage renal disease: Secondary | ICD-10-CM | POA: Diagnosis not present

## 2019-09-12 DIAGNOSIS — N2581 Secondary hyperparathyroidism of renal origin: Secondary | ICD-10-CM | POA: Diagnosis not present

## 2019-09-12 DIAGNOSIS — Z992 Dependence on renal dialysis: Secondary | ICD-10-CM | POA: Diagnosis not present

## 2019-09-13 DIAGNOSIS — J849 Interstitial pulmonary disease, unspecified: Secondary | ICD-10-CM | POA: Diagnosis not present

## 2019-09-13 DIAGNOSIS — I509 Heart failure, unspecified: Secondary | ICD-10-CM | POA: Diagnosis not present

## 2019-09-13 DIAGNOSIS — I2510 Atherosclerotic heart disease of native coronary artery without angina pectoris: Secondary | ICD-10-CM | POA: Diagnosis not present

## 2019-09-13 DIAGNOSIS — J449 Chronic obstructive pulmonary disease, unspecified: Secondary | ICD-10-CM | POA: Diagnosis not present

## 2019-09-13 DIAGNOSIS — J9621 Acute and chronic respiratory failure with hypoxia: Secondary | ICD-10-CM | POA: Diagnosis not present

## 2019-09-14 DIAGNOSIS — N2581 Secondary hyperparathyroidism of renal origin: Secondary | ICD-10-CM | POA: Diagnosis not present

## 2019-09-14 DIAGNOSIS — N186 End stage renal disease: Secondary | ICD-10-CM | POA: Diagnosis not present

## 2019-09-14 DIAGNOSIS — Z992 Dependence on renal dialysis: Secondary | ICD-10-CM | POA: Diagnosis not present

## 2019-09-15 ENCOUNTER — Encounter: Payer: Self-pay | Admitting: Podiatry

## 2019-09-15 ENCOUNTER — Other Ambulatory Visit: Payer: Self-pay

## 2019-09-15 ENCOUNTER — Ambulatory Visit (INDEPENDENT_AMBULATORY_CARE_PROVIDER_SITE_OTHER): Payer: Medicare HMO | Admitting: Podiatry

## 2019-09-15 VITALS — Temp 96.5°F

## 2019-09-15 DIAGNOSIS — N186 End stage renal disease: Secondary | ICD-10-CM

## 2019-09-15 DIAGNOSIS — I132 Hypertensive heart and chronic kidney disease with heart failure and with stage 5 chronic kidney disease, or end stage renal disease: Secondary | ICD-10-CM | POA: Diagnosis not present

## 2019-09-15 DIAGNOSIS — M79674 Pain in right toe(s): Secondary | ICD-10-CM | POA: Diagnosis not present

## 2019-09-15 DIAGNOSIS — I739 Peripheral vascular disease, unspecified: Secondary | ICD-10-CM

## 2019-09-15 DIAGNOSIS — E1122 Type 2 diabetes mellitus with diabetic chronic kidney disease: Secondary | ICD-10-CM | POA: Diagnosis not present

## 2019-09-15 DIAGNOSIS — T82898D Other specified complication of vascular prosthetic devices, implants and grafts, subsequent encounter: Secondary | ICD-10-CM | POA: Diagnosis not present

## 2019-09-15 DIAGNOSIS — M79675 Pain in left toe(s): Secondary | ICD-10-CM | POA: Diagnosis not present

## 2019-09-15 DIAGNOSIS — I82401 Acute embolism and thrombosis of unspecified deep veins of right lower extremity: Secondary | ICD-10-CM

## 2019-09-15 DIAGNOSIS — I2510 Atherosclerotic heart disease of native coronary artery without angina pectoris: Secondary | ICD-10-CM | POA: Diagnosis not present

## 2019-09-15 DIAGNOSIS — Z794 Long term (current) use of insulin: Secondary | ICD-10-CM | POA: Diagnosis not present

## 2019-09-15 DIAGNOSIS — I480 Paroxysmal atrial fibrillation: Secondary | ICD-10-CM | POA: Diagnosis not present

## 2019-09-15 DIAGNOSIS — E1151 Type 2 diabetes mellitus with diabetic peripheral angiopathy without gangrene: Secondary | ICD-10-CM | POA: Diagnosis not present

## 2019-09-15 DIAGNOSIS — I5030 Unspecified diastolic (congestive) heart failure: Secondary | ICD-10-CM | POA: Diagnosis not present

## 2019-09-15 DIAGNOSIS — B351 Tinea unguium: Secondary | ICD-10-CM | POA: Diagnosis not present

## 2019-09-15 DIAGNOSIS — E1121 Type 2 diabetes mellitus with diabetic nephropathy: Secondary | ICD-10-CM

## 2019-09-15 DIAGNOSIS — T82838D Hemorrhage of vascular prosthetic devices, implants and grafts, subsequent encounter: Secondary | ICD-10-CM | POA: Diagnosis not present

## 2019-09-15 DIAGNOSIS — Z992 Dependence on renal dialysis: Secondary | ICD-10-CM | POA: Diagnosis not present

## 2019-09-15 DIAGNOSIS — D631 Anemia in chronic kidney disease: Secondary | ICD-10-CM | POA: Diagnosis not present

## 2019-09-15 NOTE — Progress Notes (Signed)
This patient returns to my office for at risk foot care.  This patient requires this care by a professional since this patient will be at risk due to having PVD,  PAD and previous  DVT.  Patient also has diabetes with kidney disease.   This patient is unable to cut nails himself since the patient cannot reach his nails.These nails are painful walking and wearing shoes.  This patient presents for at risk foot care today.  General Appearance  Alert, conversant and in no acute stress.  Vascular  Dorsalis pedis and posterior tibial  pulses are weakly  palpable  bilaterally.  Capillary return is within normal limits  bilaterally. Temperature is within normal limits  bilaterally.  Neurologic  Senn-Weinstein monofilament wire test diminished   bilaterally. Muscle power within normal limits bilaterally.  Nails Thick disfigured discolored nails with subungual debris  from hallux to fifth toes bilaterally. No evidence of bacterial infection or drainage bilaterally.  Orthopedic  No limitations of motion  feet .  No crepitus or effusions noted.  No bony pathology or digital deformities noted.  Skin  normotropic skin with no porokeratosis noted bilaterally.  No signs of infections or ulcers noted.     Onychomycosis  Pain in right toes  Pain in left toes  Consent was obtained for treatment procedures.   Mechanical debridement of nails 1-5  bilaterally performed with a nail nipper.  Filed with dremel without incident. No infection or ulcer.     Return office visit   3 months                  Told patient to return for periodic foot care and evaluation due to potential at risk complications.   Gardiner Barefoot DPM

## 2019-09-16 DIAGNOSIS — N2581 Secondary hyperparathyroidism of renal origin: Secondary | ICD-10-CM | POA: Diagnosis not present

## 2019-09-16 DIAGNOSIS — N186 End stage renal disease: Secondary | ICD-10-CM | POA: Diagnosis not present

## 2019-09-16 DIAGNOSIS — Z992 Dependence on renal dialysis: Secondary | ICD-10-CM | POA: Diagnosis not present

## 2019-09-18 ENCOUNTER — Encounter: Payer: Self-pay | Admitting: Cardiology

## 2019-09-18 ENCOUNTER — Ambulatory Visit (INDEPENDENT_AMBULATORY_CARE_PROVIDER_SITE_OTHER): Payer: Medicare HMO | Admitting: Cardiology

## 2019-09-18 ENCOUNTER — Other Ambulatory Visit: Payer: Self-pay

## 2019-09-18 VITALS — BP 134/62 | HR 81 | Ht 70.5 in | Wt 157.0 lb

## 2019-09-18 DIAGNOSIS — I10 Essential (primary) hypertension: Secondary | ICD-10-CM

## 2019-09-18 DIAGNOSIS — D649 Anemia, unspecified: Secondary | ICD-10-CM | POA: Diagnosis not present

## 2019-09-18 DIAGNOSIS — I480 Paroxysmal atrial fibrillation: Secondary | ICD-10-CM

## 2019-09-18 DIAGNOSIS — Z992 Dependence on renal dialysis: Secondary | ICD-10-CM | POA: Diagnosis not present

## 2019-09-18 DIAGNOSIS — N186 End stage renal disease: Secondary | ICD-10-CM | POA: Diagnosis not present

## 2019-09-18 DIAGNOSIS — I5032 Chronic diastolic (congestive) heart failure: Secondary | ICD-10-CM | POA: Diagnosis not present

## 2019-09-18 DIAGNOSIS — I2510 Atherosclerotic heart disease of native coronary artery without angina pectoris: Secondary | ICD-10-CM | POA: Diagnosis not present

## 2019-09-18 DIAGNOSIS — E782 Mixed hyperlipidemia: Secondary | ICD-10-CM | POA: Diagnosis not present

## 2019-09-18 MED ORDER — AMIODARONE HCL 200 MG PO TABS: 200.0000 mg | ORAL_TABLET | Freq: Every day | ORAL | 3 refills | Status: AC

## 2019-09-18 MED ORDER — MIDODRINE HCL 10 MG PO TABS: 10.0000 mg | ORAL_TABLET | Freq: Three times a day (TID) | ORAL | 1 refills | Status: DC
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
## 2019-09-18 NOTE — Patient Instructions (Addendum)
Medication Instructions:  Your physician has recommended you make the following change in your medication:  1.  REDUCE the Amiodarone to 200 mg taking 1 tablet daily   *If you need a refill on your cardiac medications before your next appointment, please call your pharmacy*   Lab Work: None ordered  If you have labs (blood work) drawn today and your tests are completely normal, you will receive your results only by: Marland Kitchen MyChart Message (if you have MyChart) OR . A paper copy in the mail If you have any lab test that is abnormal or we need to change your treatment, we will call you to review the results.   Testing/Procedures: None ordered   Follow-Up: At [facility], you and your health needs are our priority.  As part of our continuing mission to provide you with exceptional heart care, we have created designated Provider Care Teams.  These Care Teams include your primary Cardiologist (physician) and Advanced Practice Providers (APPs -  Physician Assistants and Nurse Practitioners) who all work together to provide you with the care you need, when you need it.  We recommend signing up for the patient portal called "MyChart".  Sign up information is provided on this After Visit Summary.  MyChart is used to connect with patients for Virtual Visits (Telemedicine).  Patients are able to view lab/test results, encounter notes, upcoming appointments, etc.  Non-urgent messages can be sent to your provider as well.   To learn more about what you can do with MyChart, go to [email or web address].    Your next appointment:   4 month(s)  The format for your next appointment:   In Person  Provider:   You may see Dorris Carnes, MD or one of the following Advanced Practice Providers on your designated Care Team:    Richardson Dopp, PA-C  [place], [place]  Daune Perch, NP    Other Instructions

## 2019-09-19 DIAGNOSIS — N186 End stage renal disease: Secondary | ICD-10-CM | POA: Diagnosis not present

## 2019-09-19 DIAGNOSIS — N2581 Secondary hyperparathyroidism of renal origin: Secondary | ICD-10-CM | POA: Diagnosis not present

## 2019-09-19 DIAGNOSIS — Z992 Dependence on renal dialysis: Secondary | ICD-10-CM | POA: Diagnosis not present

## 2019-09-20 DIAGNOSIS — I480 Paroxysmal atrial fibrillation: Secondary | ICD-10-CM | POA: Diagnosis not present

## 2019-09-20 DIAGNOSIS — T82838D Hemorrhage of vascular prosthetic devices, implants and grafts, subsequent encounter: Secondary | ICD-10-CM | POA: Diagnosis not present

## 2019-09-20 DIAGNOSIS — D631 Anemia in chronic kidney disease: Secondary | ICD-10-CM | POA: Diagnosis not present

## 2019-09-20 DIAGNOSIS — I5030 Unspecified diastolic (congestive) heart failure: Secondary | ICD-10-CM | POA: Diagnosis not present

## 2019-09-20 DIAGNOSIS — T82898D Other specified complication of vascular prosthetic devices, implants and grafts, subsequent encounter: Secondary | ICD-10-CM | POA: Diagnosis not present

## 2019-09-20 DIAGNOSIS — I2510 Atherosclerotic heart disease of native coronary artery without angina pectoris: Secondary | ICD-10-CM | POA: Diagnosis not present

## 2019-09-20 DIAGNOSIS — I132 Hypertensive heart and chronic kidney disease with heart failure and with stage 5 chronic kidney disease, or end stage renal disease: Secondary | ICD-10-CM | POA: Diagnosis not present

## 2019-09-20 DIAGNOSIS — N186 End stage renal disease: Secondary | ICD-10-CM | POA: Diagnosis not present

## 2019-09-20 DIAGNOSIS — E1122 Type 2 diabetes mellitus with diabetic chronic kidney disease: Secondary | ICD-10-CM | POA: Diagnosis not present

## 2019-09-21 DIAGNOSIS — N186 End stage renal disease: Secondary | ICD-10-CM | POA: Diagnosis not present

## 2019-09-21 DIAGNOSIS — Z992 Dependence on renal dialysis: Secondary | ICD-10-CM | POA: Diagnosis not present

## 2019-09-21 DIAGNOSIS — N2581 Secondary hyperparathyroidism of renal origin: Secondary | ICD-10-CM | POA: Diagnosis not present

## 2019-09-22 ENCOUNTER — Encounter: Payer: Self-pay | Admitting: Family Medicine

## 2019-09-22 ENCOUNTER — Ambulatory Visit (INDEPENDENT_AMBULATORY_CARE_PROVIDER_SITE_OTHER): Payer: Medicare HMO | Admitting: Family Medicine

## 2019-09-22 ENCOUNTER — Other Ambulatory Visit: Payer: Self-pay

## 2019-09-22 VITALS — BP 132/60 | HR 82 | Temp 96.2°F | Resp 14 | Ht 70.0 in | Wt 157.0 lb

## 2019-09-22 DIAGNOSIS — I480 Paroxysmal atrial fibrillation: Secondary | ICD-10-CM

## 2019-09-22 DIAGNOSIS — I2510 Atherosclerotic heart disease of native coronary artery without angina pectoris: Secondary | ICD-10-CM

## 2019-09-22 DIAGNOSIS — E1122 Type 2 diabetes mellitus with diabetic chronic kidney disease: Secondary | ICD-10-CM | POA: Diagnosis not present

## 2019-09-22 DIAGNOSIS — N186 End stage renal disease: Secondary | ICD-10-CM | POA: Diagnosis not present

## 2019-09-22 DIAGNOSIS — D631 Anemia in chronic kidney disease: Secondary | ICD-10-CM | POA: Diagnosis not present

## 2019-09-22 DIAGNOSIS — Z992 Dependence on renal dialysis: Secondary | ICD-10-CM | POA: Diagnosis not present

## 2019-09-22 DIAGNOSIS — R1031 Right lower quadrant pain: Secondary | ICD-10-CM

## 2019-09-22 DIAGNOSIS — T82898D Other specified complication of vascular prosthetic devices, implants and grafts, subsequent encounter: Secondary | ICD-10-CM | POA: Diagnosis not present

## 2019-09-22 DIAGNOSIS — I132 Hypertensive heart and chronic kidney disease with heart failure and with stage 5 chronic kidney disease, or end stage renal disease: Secondary | ICD-10-CM | POA: Diagnosis not present

## 2019-09-22 DIAGNOSIS — Z13220 Encounter for screening for lipoid disorders: Secondary | ICD-10-CM | POA: Diagnosis not present

## 2019-09-22 DIAGNOSIS — Z09 Encounter for follow-up examination after completed treatment for conditions other than malignant neoplasm: Secondary | ICD-10-CM | POA: Diagnosis not present

## 2019-09-22 DIAGNOSIS — I10 Essential (primary) hypertension: Secondary | ICD-10-CM | POA: Diagnosis not present

## 2019-09-22 DIAGNOSIS — I5030 Unspecified diastolic (congestive) heart failure: Secondary | ICD-10-CM | POA: Diagnosis not present

## 2019-09-22 DIAGNOSIS — T82838D Hemorrhage of vascular prosthetic devices, implants and grafts, subsequent encounter: Secondary | ICD-10-CM | POA: Diagnosis not present

## 2019-09-22 MED ORDER — PREDNISONE 10 MG PO TABS: 10.0000 mg | ORAL_TABLET | Freq: Every day | ORAL | 2 refills | Status: DC

## 2019-09-22 MED ORDER — OXYCODONE-ACETAMINOPHEN 10-325 MG PO TABS: 1.0000 | ORAL_TABLET | ORAL | 0 refills | Status: DC | PRN
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
## 2019-09-23 DIAGNOSIS — Z992 Dependence on renal dialysis: Secondary | ICD-10-CM | POA: Diagnosis not present

## 2019-09-23 DIAGNOSIS — N2581 Secondary hyperparathyroidism of renal origin: Secondary | ICD-10-CM | POA: Diagnosis not present

## 2019-09-23 DIAGNOSIS — N186 End stage renal disease: Secondary | ICD-10-CM | POA: Diagnosis not present

## 2019-09-23 LAB — CBC WITH DIFFERENTIAL/PLATELET
Absolute Monocytes: 541 cells/uL (ref 200–950)
Basophils Absolute: 21 cells/uL (ref 0–200)
Basophils Relative: 0.4 %
Eosinophils Absolute: 191 cells/uL (ref 15–500)
Eosinophils Relative: 3.6 %
HCT: 28.8 % — ABNORMAL LOW (ref 38.5–50.0)
Hemoglobin: 9.3 g/dL — ABNORMAL LOW (ref 13.2–17.1)
Lymphs Abs: 2321 cells/uL (ref 850–3900)
MCH: 28.6 pg (ref 27.0–33.0)
MCHC: 32.3 g/dL (ref 32.0–36.0)
MCV: 88.6 fL (ref 80.0–100.0)
MPV: 10.1 fL (ref 7.5–12.5)
Monocytes Relative: 10.2 %
Neutro Abs: 2226 cells/uL (ref 1500–7800)
Neutrophils Relative %: 42 %
Platelets: 141 10*3/uL (ref 140–400)
RBC: 3.25 10*6/uL — ABNORMAL LOW (ref 4.20–5.80)
RDW: 17.3 % — ABNORMAL HIGH (ref 11.0–15.0)
Total Lymphocyte: 43.8 %
WBC: 5.3 10*3/uL (ref 3.8–10.8)

## 2019-09-23 LAB — BASIC METABOLIC PANEL WITH GFR
BUN/Creatinine Ratio: 4 (calc) — ABNORMAL LOW (ref 6–22)
BUN: 26 mg/dL — ABNORMAL HIGH (ref 7–25)
CO2: 31 mmol/L (ref 20–32)
Calcium: 8.2 mg/dL — ABNORMAL LOW (ref 8.6–10.3)
Chloride: 99 mmol/L (ref 98–110)
Creat: 5.84 mg/dL — ABNORMAL HIGH (ref 0.70–1.18)
GFR, Est African American: 10 mL/min/{1.73_m2} — ABNORMAL LOW (ref >=60)
GFR, Est Non African American: 9 mL/min/{1.73_m2} — ABNORMAL LOW (ref >=60)
Glucose, Bld: 83 mg/dL (ref 65–99)
Potassium: 4.3 mmol/L (ref 3.5–5.3)
Sodium: 139 mmol/L (ref 135–146)

## 2019-09-23 LAB — LIPID PANEL
Cholesterol: 128 mg/dL (ref <200)
HDL: 28 mg/dL — ABNORMAL LOW (ref >=40)
LDL Cholesterol (Calc): 75 mg/dL (calc)
Non-HDL Cholesterol (Calc): 100 mg/dL (calc) (ref <130)
Total CHOL/HDL Ratio: 4.6 (calc) (ref <5.0)
Triglycerides: 145 mg/dL (ref <150)

## 2019-09-26 DIAGNOSIS — N2581 Secondary hyperparathyroidism of renal origin: Secondary | ICD-10-CM | POA: Diagnosis not present

## 2019-09-26 DIAGNOSIS — N186 End stage renal disease: Secondary | ICD-10-CM | POA: Diagnosis not present

## 2019-09-26 DIAGNOSIS — Z992 Dependence on renal dialysis: Secondary | ICD-10-CM | POA: Diagnosis not present

## 2019-09-27 DIAGNOSIS — I5030 Unspecified diastolic (congestive) heart failure: Secondary | ICD-10-CM | POA: Diagnosis not present

## 2019-09-27 DIAGNOSIS — I2510 Atherosclerotic heart disease of native coronary artery without angina pectoris: Secondary | ICD-10-CM | POA: Diagnosis not present

## 2019-09-27 DIAGNOSIS — D631 Anemia in chronic kidney disease: Secondary | ICD-10-CM | POA: Diagnosis not present

## 2019-09-27 DIAGNOSIS — I480 Paroxysmal atrial fibrillation: Secondary | ICD-10-CM | POA: Diagnosis not present

## 2019-09-27 DIAGNOSIS — I132 Hypertensive heart and chronic kidney disease with heart failure and with stage 5 chronic kidney disease, or end stage renal disease: Secondary | ICD-10-CM | POA: Diagnosis not present

## 2019-09-27 DIAGNOSIS — T82838D Hemorrhage of vascular prosthetic devices, implants and grafts, subsequent encounter: Secondary | ICD-10-CM | POA: Diagnosis not present

## 2019-09-27 DIAGNOSIS — Z992 Dependence on renal dialysis: Secondary | ICD-10-CM | POA: Diagnosis not present

## 2019-09-27 DIAGNOSIS — N186 End stage renal disease: Secondary | ICD-10-CM | POA: Diagnosis not present

## 2019-09-27 DIAGNOSIS — E1122 Type 2 diabetes mellitus with diabetic chronic kidney disease: Secondary | ICD-10-CM | POA: Diagnosis not present

## 2019-09-27 DIAGNOSIS — T82898D Other specified complication of vascular prosthetic devices, implants and grafts, subsequent encounter: Secondary | ICD-10-CM | POA: Diagnosis not present

## 2019-09-28 DIAGNOSIS — N2581 Secondary hyperparathyroidism of renal origin: Secondary | ICD-10-CM | POA: Diagnosis not present

## 2019-09-28 DIAGNOSIS — N186 End stage renal disease: Secondary | ICD-10-CM | POA: Diagnosis not present

## 2019-09-28 DIAGNOSIS — Z992 Dependence on renal dialysis: Secondary | ICD-10-CM | POA: Diagnosis not present

## 2019-09-29 DIAGNOSIS — E1122 Type 2 diabetes mellitus with diabetic chronic kidney disease: Secondary | ICD-10-CM | POA: Diagnosis not present

## 2019-09-29 DIAGNOSIS — N186 End stage renal disease: Secondary | ICD-10-CM | POA: Diagnosis not present

## 2019-09-29 DIAGNOSIS — I480 Paroxysmal atrial fibrillation: Secondary | ICD-10-CM | POA: Diagnosis not present

## 2019-09-29 DIAGNOSIS — D631 Anemia in chronic kidney disease: Secondary | ICD-10-CM | POA: Diagnosis not present

## 2019-09-29 DIAGNOSIS — T82838D Hemorrhage of vascular prosthetic devices, implants and grafts, subsequent encounter: Secondary | ICD-10-CM | POA: Diagnosis not present

## 2019-09-29 DIAGNOSIS — T82898D Other specified complication of vascular prosthetic devices, implants and grafts, subsequent encounter: Secondary | ICD-10-CM | POA: Diagnosis not present

## 2019-09-29 DIAGNOSIS — I5030 Unspecified diastolic (congestive) heart failure: Secondary | ICD-10-CM | POA: Diagnosis not present

## 2019-09-29 DIAGNOSIS — I2510 Atherosclerotic heart disease of native coronary artery without angina pectoris: Secondary | ICD-10-CM | POA: Diagnosis not present

## 2019-09-29 DIAGNOSIS — I132 Hypertensive heart and chronic kidney disease with heart failure and with stage 5 chronic kidney disease, or end stage renal disease: Secondary | ICD-10-CM | POA: Diagnosis not present

## 2019-09-30 DIAGNOSIS — N186 End stage renal disease: Secondary | ICD-10-CM | POA: Diagnosis not present

## 2019-09-30 DIAGNOSIS — N2581 Secondary hyperparathyroidism of renal origin: Secondary | ICD-10-CM | POA: Diagnosis not present

## 2019-09-30 DIAGNOSIS — Z992 Dependence on renal dialysis: Secondary | ICD-10-CM | POA: Diagnosis not present

## 2019-10-03 ENCOUNTER — Other Ambulatory Visit: Payer: Self-pay | Admitting: *Deleted

## 2019-10-03 DIAGNOSIS — N2581 Secondary hyperparathyroidism of renal origin: Secondary | ICD-10-CM | POA: Diagnosis not present

## 2019-10-03 DIAGNOSIS — N186 End stage renal disease: Secondary | ICD-10-CM

## 2019-10-03 DIAGNOSIS — Z992 Dependence on renal dialysis: Secondary | ICD-10-CM | POA: Diagnosis not present

## 2019-10-04 ENCOUNTER — Ambulatory Visit (INDEPENDENT_AMBULATORY_CARE_PROVIDER_SITE_OTHER): Payer: Medicare HMO | Admitting: Vascular Surgery

## 2019-10-04 ENCOUNTER — Encounter: Payer: Self-pay | Admitting: Vascular Surgery

## 2019-10-04 ENCOUNTER — Other Ambulatory Visit: Payer: Self-pay

## 2019-10-04 ENCOUNTER — Ambulatory Visit (HOSPITAL_COMMUNITY)
Admission: RE | Admit: 2019-10-04 | Discharge: 2019-10-04 | Disposition: A | Discharge status: Home / Self Care | Payer: Medicare HMO | Source: Ambulatory Visit | Attending: Vascular Surgery | Admitting: Vascular Surgery

## 2019-10-04 VITALS — BP 127/58 | HR 73 | Temp 97.3°F | Resp 20 | Ht 70.0 in | Wt 157.0 lb

## 2019-10-04 DIAGNOSIS — Z992 Dependence on renal dialysis: Secondary | ICD-10-CM

## 2019-10-04 DIAGNOSIS — N186 End stage renal disease: Secondary | ICD-10-CM | POA: Diagnosis not present

## 2019-10-04 DIAGNOSIS — I132 Hypertensive heart and chronic kidney disease with heart failure and with stage 5 chronic kidney disease, or end stage renal disease: Secondary | ICD-10-CM | POA: Diagnosis not present

## 2019-10-04 DIAGNOSIS — I2510 Atherosclerotic heart disease of native coronary artery without angina pectoris: Secondary | ICD-10-CM | POA: Diagnosis not present

## 2019-10-04 DIAGNOSIS — I5030 Unspecified diastolic (congestive) heart failure: Secondary | ICD-10-CM | POA: Diagnosis not present

## 2019-10-04 DIAGNOSIS — T82898A Other specified complication of vascular prosthetic devices, implants and grafts, initial encounter: Secondary | ICD-10-CM

## 2019-10-04 DIAGNOSIS — E1122 Type 2 diabetes mellitus with diabetic chronic kidney disease: Secondary | ICD-10-CM | POA: Diagnosis not present

## 2019-10-04 DIAGNOSIS — T82838D Hemorrhage of vascular prosthetic devices, implants and grafts, subsequent encounter: Secondary | ICD-10-CM | POA: Diagnosis not present

## 2019-10-04 DIAGNOSIS — T82898D Other specified complication of vascular prosthetic devices, implants and grafts, subsequent encounter: Secondary | ICD-10-CM | POA: Diagnosis not present

## 2019-10-04 DIAGNOSIS — I480 Paroxysmal atrial fibrillation: Secondary | ICD-10-CM | POA: Diagnosis not present

## 2019-10-04 DIAGNOSIS — D631 Anemia in chronic kidney disease: Secondary | ICD-10-CM | POA: Diagnosis not present
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
## 2019-10-05 ENCOUNTER — Other Ambulatory Visit (HOSPITAL_COMMUNITY)
Admission: RE | Admit: 2019-10-05 | Discharge: 2019-10-05 | Disposition: A | Discharge status: Home / Self Care | Payer: Medicare HMO | Source: Ambulatory Visit | Attending: Vascular Surgery | Admitting: Vascular Surgery

## 2019-10-05 DIAGNOSIS — N2581 Secondary hyperparathyroidism of renal origin: Secondary | ICD-10-CM | POA: Diagnosis not present

## 2019-10-05 DIAGNOSIS — Z01812 Encounter for preprocedural laboratory examination: Secondary | ICD-10-CM | POA: Diagnosis not present

## 2019-10-05 DIAGNOSIS — Z20822 Contact with and (suspected) exposure to covid-19: Secondary | ICD-10-CM | POA: Diagnosis not present

## 2019-10-05 DIAGNOSIS — Z992 Dependence on renal dialysis: Secondary | ICD-10-CM | POA: Diagnosis not present

## 2019-10-05 DIAGNOSIS — N186 End stage renal disease: Secondary | ICD-10-CM | POA: Diagnosis not present

## 2019-10-06 ENCOUNTER — Encounter (HOSPITAL_COMMUNITY): Payer: Self-pay | Admitting: Vascular Surgery

## 2019-10-06 ENCOUNTER — Other Ambulatory Visit: Payer: Self-pay

## 2019-10-06 DIAGNOSIS — I132 Hypertensive heart and chronic kidney disease with heart failure and with stage 5 chronic kidney disease, or end stage renal disease: Secondary | ICD-10-CM | POA: Diagnosis not present

## 2019-10-06 DIAGNOSIS — T82898D Other specified complication of vascular prosthetic devices, implants and grafts, subsequent encounter: Secondary | ICD-10-CM | POA: Diagnosis not present

## 2019-10-06 DIAGNOSIS — D631 Anemia in chronic kidney disease: Secondary | ICD-10-CM | POA: Diagnosis not present

## 2019-10-06 DIAGNOSIS — I480 Paroxysmal atrial fibrillation: Secondary | ICD-10-CM | POA: Diagnosis not present

## 2019-10-06 DIAGNOSIS — I5030 Unspecified diastolic (congestive) heart failure: Secondary | ICD-10-CM | POA: Diagnosis not present

## 2019-10-06 DIAGNOSIS — E1122 Type 2 diabetes mellitus with diabetic chronic kidney disease: Secondary | ICD-10-CM | POA: Diagnosis not present

## 2019-10-06 DIAGNOSIS — N186 End stage renal disease: Secondary | ICD-10-CM | POA: Diagnosis not present

## 2019-10-06 DIAGNOSIS — I2510 Atherosclerotic heart disease of native coronary artery without angina pectoris: Secondary | ICD-10-CM | POA: Diagnosis not present

## 2019-10-06 DIAGNOSIS — T82838D Hemorrhage of vascular prosthetic devices, implants and grafts, subsequent encounter: Secondary | ICD-10-CM | POA: Diagnosis not present

## 2019-10-06 LAB — SARS CORONAVIRUS 2 (TAT 6-24 HRS): SARS Coronavirus 2: NEGATIVE

## 2019-10-06 NOTE — Progress Notes (Addendum)
Anesthesia Chart Review: Same day workup  Follows with cardiology for hx of history of HTN, paroxysmal afib, CAD s/p BMS to RCA in 2007. Recent admission 3/5-3/11/21 for severe anemia and found to have NSTEMI.  Cardiac catheterization on 09/06/2019 with finding of 90% mid to distal RCA lesion that was treated with orbital atherectomy and a drug-eluting stent.  In the setting of his anemia it was recommended that he be on dual antiplatelet therapy with aspirin and clopidogrel for at least 6 months, holding his anticoagulation.  To reevaluate in 6 months. During admission his metoprolol was stopped due to hypotension and he was placed on amiodarone for rhythm control due to history of afib.   Seen by PCP Dr. Dennard Schaumann 09/22/19 and reportedly had stopped all medications. Dr. Dennard Schaumann counseled the pt at length about importance of medication compliance and per note pt agreeable to restarting ASA, Plavix, and amiodarone at that time. Labwork showed stable anemia with Hgb 9.3.   ESRD on HD TTS, followed by Dr. Marval Regal. Experiencing steal symptoms in LUE related to brachiocephalic fistula.   Remote hx of axillobifemoral bypass.  DMII, last A1c 4.4 on 09/01/19 in setting of severe anemia.  Pt will need DOS labs and eval.   EKG 09/18/19: NSR. Rate 81.  Cath and PCI 09/06/19:  Mid LAD lesion is 30% stenosed with 25% stenosed side branch in 2nd Diag.  Mid RCA to Dist RCA lesion is 90% stenosed.  Mid RCA lesion is 35% stenosed.  Prox RCA to Mid RCA lesion is 50% stenosed with 30% stenosed side branch in RV Branch.  Prox RCA lesion is 40% stenosed.  Post intervention, there is a 0% residual stenosis.  Post intervention, there is a 0% residual stenosis.  A drug-eluting stent was successfully placed using a STENT RESOLUTE ONYX 3.5X22.   Successful orbital atherectomy and drug-eluting stent placement to mid/distal right coronary artery.  Recommendations: Given the patient's anemia, I do not think he  will tolerate resumption of anticoagulation at the present time.  Recommend treatment with dual antiplatelet therapy with aspirin and clopidogrel for at least 6 months and if the patient's anemia is improved then, resuming anticoagulation can be considered with stopping aspirin. Continue aggressive treatment of risk factors.  TTE 09/02/19: 1. Akinesis of the inferolateral wall with overall low normal LV  function; Grade 2 diastolic dysfunction; mild LVH; mild AI; moderate MR;  severe LAE; mild TR.  2. Left ventricular ejection fraction, by estimation, is 50 to 55%. The  left ventricle has low normal function. The left ventricle demonstrates  regional wall motion abnormalities (see scoring diagram/findings for  description). There is mild left  ventricular hypertrophy. Left ventricular diastolic parameters are  consistent with Grade II diastolic dysfunction (pseudonormalization).  3. Right ventricular systolic function is normal. The right ventricular  size is normal. There is mildly elevated pulmonary artery systolic  pressure.  4. Left atrial size was severely dilated.  5. The mitral valve is normal in structure and function. Moderate mitral  valve regurgitation. No evidence of mitral stenosis.  6. The aortic valve is tricuspid. Aortic valve regurgitation is mild.  Mild aortic valve sclerosis is present, with no evidence of aortic valve  stenosis.  7. The inferior vena cava is normal in size with greater than 50%  respiratory variability, suggesting right atrial pressure of 3 mmHg.   CTA chest 09/01/19: IMPRESSION: 1. No evidence of acute pulmonary embolism. 2. Stable small to moderate size bilateral pleural effusions. 3. Stable 0.9 cm  x 0.9 cm focus of parenchymal low attenuation within the posterior aspect of the right upper lobe. 4. Interval development of a 1.3 cm x 1.1 cm noncalcified lung nodule within the posterolateral aspect of the right middle lobe. Given the rapid  interval development of this nodule, a neoplastic process cannot be excluded. Recommend short-term interval follow-up and PET-CT for further evaluation. 5. Stable areas of atelectasis and/or infiltrate within the posterior aspect of the bilateral lung bases. 6. Severe coronary artery calcification. 7. Stable appearance of a left axillary/femoral bypass graft.   Wynonia Musty [facility]/Anesthesiology Phone [phone] 10/06/2019 4:28 PM

## 2019-10-06 NOTE — Progress Notes (Addendum)
Sent secure chat to Dr. Scot Dock and spoke to [facility] at his office regarding patient's Plavix.  Want clarification on need to continue or stop.   1637 10/06/19 Reached out to Dr. Scot Dock on his cell.  He said it would be fine to continue Plavix.

## 2019-10-06 NOTE — Progress Notes (Signed)
Spoke with pt's daughter, [name] for pre-op call. DPR on file. Pt has CAD with recent stent placement. Pt is on Aspirin and Plavix and per Dr. Scot Dock (Ginger, RN called and spoke with Dr. Scot Dock herself and got that order). [name] states pt has done well since his recent cath. Pt is a type 2 diabetic. Last A1C was 4.4 on 08/31/19. [name] states pt's fasting blood sugar is usually less than 130. Instructed her to have pt not use any Novolog insulin Sunday at bedtime. Instructed [name] to have pt check his blood sugar when he gets up Monday AM and every 2 hours until he leaves for the hospital. If blood sugar is >220 take 1/2 of usual correction dose of Novolog insulin. If blood sugar is 70 or below, treat with 1/2 cup of clear juice (apple or cranberry) and recheck blood sugar 15 minutes after drinking juice. If blood sugar continues to be 70 or below, call the Short Stay department and ask to speak to a nurse. [name] voiced understanding.  Covid test done yesterday and it is negative. She states pt has been in quarantine since the test was done and understands he needs to stay in quarantine until he comes to the hospital on Monday.

## 2019-10-06 NOTE — Anesthesia Preprocedure Evaluation (Addendum)
Anesthesia Evaluation  Patient identified by MRN, date of birth, ID band Patient awake    Reviewed: Allergy & Precautions, NPO status , Patient's Chart, lab work & pertinent test results  History of Anesthesia Complications Negative for: history of anesthetic complications  Airway Mallampati: II  TM Distance: >3 FB Neck ROM: Full    Dental  (+) Edentulous Upper, Poor Dentition, Dental Advisory Given, Missing, Chipped   Pulmonary shortness of breath, COPD,  oxygen dependent, Current Smoker and Patient abstained from smoking.,  10/05/2019 SARS coronavirus NEG Interstitial lung disease      rales    Cardiovascular hypertension, + Peripheral Vascular Disease (s/p aorto-bifem), +CHF and + DVT  + dysrhythmias Atrial Fibrillation  Rhythm:Irregular Rate:Normal  08/2019 cath: Successful orbital atherectomy and drug-eluting stent placement to mid/distal right coronary artery.  08/2019 ECHO: EF 50-55%, grade II diastolic dysfunction, mild AI, mod MR, mild TR   Neuro/Psych negative neurological ROS     GI/Hepatic Neg liver ROS, GERD  Controlled,  Endo/Other  diabetes, Insulin Dependent  Renal/GU Dialysis and ESRFRenal disease (K+ 4.2, TuThSa)   H/o bladder cancer    Musculoskeletal   Abdominal   Peds  Hematology  (+) Blood dyscrasia (Hb 9.9), anemia ,   Anesthesia Other Findings   Reproductive/Obstetrics                          Anesthesia Physical Anesthesia Plan  ASA: III  Anesthesia Plan: MAC   Post-op Pain Management:    Induction:   PONV Risk Score and Plan: 0  Airway Management Planned: Natural Airway and Simple Face Mask  Additional Equipment:   Intra-op Plan:   Post-operative Plan:   Informed Consent: I have reviewed the patients History and Physical, chart, labs and discussed the procedure including the risks, benefits and alternatives for the proposed anesthesia with the patient  or authorized representative who has indicated his/her understanding and acceptance.     Dental advisory given  Plan Discussed with: CRNA and Surgeon  Anesthesia Plan Comments: (Follows with cardiology for hx of history of HTN, paroxysmal afib, CAD s/p BMS to RCA in 2007. Recent admission 3/5-3/11/21 for severe anemia and found to have NSTEMI.  Cardiac catheterization on 09/06/2019 with finding of 90% mid to distal RCA lesion that was treated with orbital atherectomy and a drug-eluting stent.  In the setting of his anemia it was recommended that he be on dual antiplatelet therapy with aspirin and clopidogrel for at least 6 months, holding his anticoagulation.  To reevaluate in 6 months. During admission his metoprolol was stopped due to hypotension and he was placed midodrine as well as on amiodarone for rhythm control due to history of afib.   Seen by PCP Dr. Dennard Schaumann 09/22/19 and reportedly had stopped all medications. Dr. Dennard Schaumann counseled the pt at length about importance of medication compliance and per note pt agreeable to restarting ASA, Plavix, and amiodarone at that time. Labwork showed stable anemia with Hgb 9.3.   ESRD on HD TTS, followed by Dr. Marval Regal. Experiencing steal symptoms in LUE related to brachiocephalic fistula.    Pt c/o chest pain, EKG totally normal, unchanged from pre-op. Dr Scot Dock to arrange for post op dialysis    Remote hx of axillobifemoral bypass.  DMII, last A1c 4.4 on 09/01/19 in setting of severe anemia.  Pt will need DOS labs and eval.   EKG 09/18/19: NSR. Rate 81.  Cath and PCI 09/06/19: Mid LAD lesion is 30%  stenosed with 25% stenosed side branch in 2nd Diag. Mid RCA to Dist RCA lesion is 90% stenosed. Mid RCA lesion is 35% stenosed. Prox RCA to Mid RCA lesion is 50% stenosed with 30% stenosed side branch in RV Branch. Prox RCA lesion is 40% stenosed. Post intervention, there is a 0% residual stenosis. Post intervention, there is a 0% residual  stenosis. A drug-eluting stent was successfully placed using a STENT RESOLUTE ONYX 3.5X22.   Successful orbital atherectomy and drug-eluting stent placement to mid/distal right coronary artery.  Recommendations: Given the patient's anemia, I do not think he will tolerate resumption of anticoagulation at the present time.  Recommend treatment with dual antiplatelet therapy with aspirin and clopidogrel for at least 6 months and if the patient's anemia is improved then, resuming anticoagulation can be considered with stopping aspirin. Continue aggressive treatment of risk factors.  TTE 09/02/19: 1. Akinesis of the inferolateral wall with overall low normal LV  function; Grade 2 diastolic dysfunction; mild LVH; mild AI; moderate MR;  severe LAE; mild TR.  2. Left ventricular ejection fraction, by estimation, is 50 to 55%. The  left ventricle has low normal function. The left ventricle demonstrates  regional wall motion abnormalities (see scoring diagram/findings for  description). There is mild left  ventricular hypertrophy. Left ventricular diastolic parameters are  consistent with Grade II diastolic dysfunction (pseudonormalization).  3. Right ventricular systolic function is normal. The right ventricular  size is normal. There is mildly elevated pulmonary artery systolic  pressure.  4. Left atrial size was severely dilated.  5. The mitral valve is normal in structure and function. Moderate mitral  valve regurgitation. No evidence of mitral stenosis.  6. The aortic valve is tricuspid. Aortic valve regurgitation is mild.  Mild aortic valve sclerosis is present, with no evidence of aortic valve  stenosis.  7. The inferior vena cava is normal in size with greater than 50%  respiratory variability, suggesting right atrial pressure of 3 mmHg.   CTA chest 09/01/19: IMPRESSION: 1. No evidence of acute pulmonary embolism. 2. Stable small to moderate size bilateral pleural effusions. 3.  Stable 0.9 cm x 0.9 cm focus of parenchymal low attenuation within the posterior aspect of the right upper lobe. 4. Interval development of a 1.3 cm x 1.1 cm noncalcified lung nodule within the posterolateral aspect of the right middle lobe. Given the rapid interval development of this nodule, a neoplastic process cannot be excluded. Recommend short-term interval follow-up and PET-CT for further evaluation. 5. Stable areas of atelectasis and/or infiltrate within the posterior aspect of the bilateral lung bases. 6. Severe coronary artery calcification. 7. Stable appearance of a left axillary/femoral bypass graft. )     Anesthesia Quick Evaluation

## 2019-10-07 DIAGNOSIS — N2581 Secondary hyperparathyroidism of renal origin: Secondary | ICD-10-CM | POA: Diagnosis not present

## 2019-10-07 DIAGNOSIS — N186 End stage renal disease: Secondary | ICD-10-CM | POA: Diagnosis not present

## 2019-10-07 DIAGNOSIS — Z992 Dependence on renal dialysis: Secondary | ICD-10-CM | POA: Diagnosis not present

## 2019-10-08 DIAGNOSIS — I509 Heart failure, unspecified: Secondary | ICD-10-CM | POA: Diagnosis not present

## 2019-10-08 DIAGNOSIS — J9621 Acute and chronic respiratory failure with hypoxia: Secondary | ICD-10-CM | POA: Diagnosis not present

## 2019-10-08 DIAGNOSIS — J849 Interstitial pulmonary disease, unspecified: Secondary | ICD-10-CM | POA: Diagnosis not present

## 2019-10-08 DIAGNOSIS — I2510 Atherosclerotic heart disease of native coronary artery without angina pectoris: Secondary | ICD-10-CM | POA: Diagnosis not present

## 2019-10-08 DIAGNOSIS — J449 Chronic obstructive pulmonary disease, unspecified: Secondary | ICD-10-CM | POA: Diagnosis not present

## 2019-10-09 ENCOUNTER — Encounter (HOSPITAL_COMMUNITY)
Admission: RE | Disposition: A | Discharge status: Home / Self Care | Payer: Self-pay | Source: Home / Self Care | Attending: Internal Medicine

## 2019-10-09 ENCOUNTER — Encounter (HOSPITAL_COMMUNITY): Payer: Self-pay | Admitting: Vascular Surgery

## 2019-10-09 ENCOUNTER — Observation Stay (HOSPITAL_COMMUNITY)
Admission: RE | Admit: 2019-10-09 | Discharge: 2019-10-11 | Disposition: A | Discharge status: Home / Self Care | Payer: Medicare HMO | Attending: Internal Medicine | Admitting: Internal Medicine

## 2019-10-09 ENCOUNTER — Ambulatory Visit (HOSPITAL_COMMUNITY): Payer: Medicare HMO | Admitting: Physician Assistant

## 2019-10-09 ENCOUNTER — Ambulatory Visit (HOSPITAL_COMMUNITY): Payer: Medicare HMO

## 2019-10-09 ENCOUNTER — Other Ambulatory Visit: Payer: Self-pay

## 2019-10-09 DIAGNOSIS — Z992 Dependence on renal dialysis: Secondary | ICD-10-CM | POA: Diagnosis not present

## 2019-10-09 DIAGNOSIS — T82898A Other specified complication of vascular prosthetic devices, implants and grafts, initial encounter: Secondary | ICD-10-CM | POA: Diagnosis not present

## 2019-10-09 DIAGNOSIS — K219 Gastro-esophageal reflux disease without esophagitis: Secondary | ICD-10-CM | POA: Insufficient documentation

## 2019-10-09 DIAGNOSIS — E1151 Type 2 diabetes mellitus with diabetic peripheral angiopathy without gangrene: Secondary | ICD-10-CM | POA: Insufficient documentation

## 2019-10-09 DIAGNOSIS — I4891 Unspecified atrial fibrillation: Secondary | ICD-10-CM | POA: Diagnosis present

## 2019-10-09 DIAGNOSIS — R0602 Shortness of breath: Secondary | ICD-10-CM

## 2019-10-09 DIAGNOSIS — J810 Acute pulmonary edema: Secondary | ICD-10-CM

## 2019-10-09 DIAGNOSIS — Z79899 Other long term (current) drug therapy: Secondary | ICD-10-CM | POA: Insufficient documentation

## 2019-10-09 DIAGNOSIS — E1129 Type 2 diabetes mellitus with other diabetic kidney complication: Secondary | ICD-10-CM | POA: Diagnosis present

## 2019-10-09 DIAGNOSIS — I2510 Atherosclerotic heart disease of native coronary artery without angina pectoris: Secondary | ICD-10-CM | POA: Diagnosis not present

## 2019-10-09 DIAGNOSIS — I959 Hypotension, unspecified: Secondary | ICD-10-CM | POA: Diagnosis not present

## 2019-10-09 DIAGNOSIS — J849 Interstitial pulmonary disease, unspecified: Secondary | ICD-10-CM | POA: Diagnosis present

## 2019-10-09 DIAGNOSIS — I480 Paroxysmal atrial fibrillation: Secondary | ICD-10-CM | POA: Diagnosis not present

## 2019-10-09 DIAGNOSIS — E1122 Type 2 diabetes mellitus with diabetic chronic kidney disease: Secondary | ICD-10-CM | POA: Diagnosis not present

## 2019-10-09 DIAGNOSIS — I132 Hypertensive heart and chronic kidney disease with heart failure and with stage 5 chronic kidney disease, or end stage renal disease: Secondary | ICD-10-CM | POA: Insufficient documentation

## 2019-10-09 DIAGNOSIS — Y841 Kidney dialysis as the cause of abnormal reaction of the patient, or of later complication, without mention of misadventure at the time of the procedure: Secondary | ICD-10-CM | POA: Diagnosis not present

## 2019-10-09 DIAGNOSIS — N186 End stage renal disease: Secondary | ICD-10-CM

## 2019-10-09 DIAGNOSIS — E785 Hyperlipidemia, unspecified: Secondary | ICD-10-CM | POA: Diagnosis not present

## 2019-10-09 DIAGNOSIS — I10 Essential (primary) hypertension: Secondary | ICD-10-CM | POA: Diagnosis present

## 2019-10-09 DIAGNOSIS — Z86718 Personal history of other venous thrombosis and embolism: Secondary | ICD-10-CM | POA: Diagnosis not present

## 2019-10-09 DIAGNOSIS — Z8551 Personal history of malignant neoplasm of bladder: Secondary | ICD-10-CM | POA: Insufficient documentation

## 2019-10-09 DIAGNOSIS — I5032 Chronic diastolic (congestive) heart failure: Secondary | ICD-10-CM | POA: Diagnosis not present

## 2019-10-09 DIAGNOSIS — J9601 Acute respiratory failure with hypoxia: Secondary | ICD-10-CM | POA: Diagnosis not present

## 2019-10-09 DIAGNOSIS — Z794 Long term (current) use of insulin: Secondary | ICD-10-CM | POA: Insufficient documentation

## 2019-10-09 DIAGNOSIS — I951 Orthostatic hypotension: Secondary | ICD-10-CM

## 2019-10-09 DIAGNOSIS — F17210 Nicotine dependence, cigarettes, uncomplicated: Secondary | ICD-10-CM | POA: Diagnosis not present

## 2019-10-09 DIAGNOSIS — I4819 Other persistent atrial fibrillation: Secondary | ICD-10-CM | POA: Diagnosis not present

## 2019-10-09 DIAGNOSIS — Z7902 Long term (current) use of antithrombotics/antiplatelets: Secondary | ICD-10-CM | POA: Insufficient documentation

## 2019-10-09 DIAGNOSIS — Z7982 Long term (current) use of aspirin: Secondary | ICD-10-CM | POA: Diagnosis not present

## 2019-10-09 DIAGNOSIS — J9621 Acute and chronic respiratory failure with hypoxia: Secondary | ICD-10-CM | POA: Diagnosis not present

## 2019-10-09 DIAGNOSIS — I509 Heart failure, unspecified: Secondary | ICD-10-CM | POA: Diagnosis not present

## 2019-10-09 DIAGNOSIS — J449 Chronic obstructive pulmonary disease, unspecified: Secondary | ICD-10-CM | POA: Diagnosis not present

## 2019-10-09 DIAGNOSIS — I739 Peripheral vascular disease, unspecified: Secondary | ICD-10-CM | POA: Diagnosis present

## 2019-10-09 HISTORY — DX: Dyspnea, unspecified: R06.00

## 2019-10-09 HISTORY — PX: REVISON OF ARTERIOVENOUS FISTULA: SHX6074

## 2019-10-09 LAB — RENAL FUNCTION PANEL
Albumin: 2.8 g/dL — ABNORMAL LOW (ref 3.5–5.0)
Anion gap: 16 — ABNORMAL HIGH (ref 5–15)
BUN: 36 mg/dL — ABNORMAL HIGH (ref 8–23)
CO2: 21 mmol/L — ABNORMAL LOW (ref 22–32)
Calcium: 8.2 mg/dL — ABNORMAL LOW (ref 8.9–10.3)
Chloride: 98 mmol/L (ref 98–111)
Creatinine, Ser: 6.1 mg/dL — ABNORMAL HIGH (ref 0.61–1.24)
GFR calc Af Amer: 10 mL/min — ABNORMAL LOW (ref >60)
GFR calc non Af Amer: 8 mL/min — ABNORMAL LOW (ref >60)
Glucose, Bld: 172 mg/dL — ABNORMAL HIGH (ref 70–99)
Phosphorus: 6.7 mg/dL — ABNORMAL HIGH (ref 2.5–4.6)
Potassium: 4.4 mmol/L (ref 3.5–5.1)
Sodium: 135 mmol/L (ref 135–145)

## 2019-10-09 LAB — POCT I-STAT, CHEM 8
BUN: 32 mg/dL — ABNORMAL HIGH (ref 8–23)
Calcium, Ion: 1.06 mmol/L — ABNORMAL LOW (ref 1.15–1.40)
Chloride: 101 mmol/L (ref 98–111)
Creatinine, Ser: 6.6 mg/dL — ABNORMAL HIGH (ref 0.61–1.24)
Glucose, Bld: 82 mg/dL (ref 70–99)
HCT: 29 % — ABNORMAL LOW (ref 39.0–52.0)
Hemoglobin: 9.9 g/dL — ABNORMAL LOW (ref 13.0–17.0)
Potassium: 4.2 mmol/L (ref 3.5–5.1)
Sodium: 138 mmol/L (ref 135–145)
TCO2: 28 mmol/L (ref 22–32)

## 2019-10-09 LAB — GLUCOSE, CAPILLARY
Glucose-Capillary: 65 mg/dL — ABNORMAL LOW (ref 70–99)
Glucose-Capillary: 123 mg/dL — ABNORMAL HIGH (ref 70–99)
Glucose-Capillary: 89 mg/dL (ref 70–99)
Glucose-Capillary: 64 mg/dL — ABNORMAL LOW (ref 70–99)

## 2019-10-09 LAB — CBC
HCT: 33.5 % — ABNORMAL LOW (ref 39.0–52.0)
Hemoglobin: 10.3 g/dL — ABNORMAL LOW (ref 13.0–17.0)
MCH: 29 pg (ref 26.0–34.0)
MCHC: 30.7 g/dL (ref 30.0–36.0)
MCV: 94.4 fL (ref 80.0–100.0)
Platelets: 232 10*3/uL (ref 150–400)
RBC: 3.55 MIL/uL — ABNORMAL LOW (ref 4.22–5.81)
RDW: 18.4 % — ABNORMAL HIGH (ref 11.5–15.5)
WBC: 13.9 10*3/uL — ABNORMAL HIGH (ref 4.0–10.5)
nRBC: 0 % (ref 0.0–0.2)

## 2019-10-09 SURGERY — REVISON OF ARTERIOVENOUS FISTULA
Anesthesia: Monitor Anesthesia Care | Site: Arm Upper | Laterality: Left

## 2019-10-09 MED ORDER — 0.9 % SODIUM CHLORIDE (POUR BTL) OPTIME
TOPICAL | Status: DC | PRN
  Administered 2019-10-09: 09:00:00 1000 mL

## 2019-10-09 MED ORDER — CHLORHEXIDINE GLUCONATE 4 % EX LIQD: 60.0000 mL | Freq: Once | CUTANEOUS | Status: DC

## 2019-10-09 MED ORDER — ATORVASTATIN CALCIUM 80 MG PO TABS
80.0000 mg | ORAL_TABLET | Freq: Every day | ORAL | Status: DC
  Administered 2019-10-09 – 2019-10-10 (×2): 80 mg via ORAL
  Filled 2019-10-09 (×2): qty 1

## 2019-10-09 MED ORDER — DEXTROSE 50 % IV SOLN: 12.5000 g | INTRAVENOUS | Status: AC

## 2019-10-09 MED ORDER — OXYCODONE-ACETAMINOPHEN 5-325 MG PO TABS: 1.0000 | ORAL_TABLET | ORAL | Status: DC | PRN

## 2019-10-09 MED ORDER — LIDOCAINE HCL (PF) 1 % IJ SOLN
INTRAMUSCULAR | Status: DC | PRN
  Administered 2019-10-09: 30 mL

## 2019-10-09 MED ORDER — PROTAMINE SULFATE 10 MG/ML IV SOLN
INTRAVENOUS | Status: AC
  Filled 2019-10-09: qty 25

## 2019-10-09 MED ORDER — AMIODARONE HCL 200 MG PO TABS
200.0000 mg | ORAL_TABLET | Freq: Every day | ORAL | Status: DC
  Administered 2019-10-10: 200 mg via ORAL
  Filled 2019-10-09: qty 1

## 2019-10-09 MED ORDER — ACETAMINOPHEN 325 MG PO TABS: 650.0000 mg | ORAL_TABLET | Freq: Four times a day (QID) | ORAL | Status: DC | PRN

## 2019-10-09 MED ORDER — POLYETHYLENE GLYCOL 3350 17 G PO PACK: 17.0000 g | PACK | Freq: Two times a day (BID) | ORAL | Status: DC | PRN

## 2019-10-09 MED ORDER — CEFAZOLIN SODIUM-DEXTROSE 2-4 GM/100ML-% IV SOLN
2.0000 g | INTRAVENOUS | Status: AC
  Administered 2019-10-09: 2 g via INTRAVENOUS
  Filled 2019-10-09: qty 100

## 2019-10-09 MED ORDER — OXYCODONE HCL 5 MG PO TABS: 5.0000 mg | ORAL_TABLET | ORAL | 0 refills | Status: DC | PRN

## 2019-10-09 MED ORDER — OXYCODONE-ACETAMINOPHEN 10-325 MG PO TABS: 1.0000 | ORAL_TABLET | ORAL | Status: DC | PRN

## 2019-10-09 MED ORDER — SODIUM CHLORIDE 0.9 % IV SOLN: INTRAVENOUS | Status: DC

## 2019-10-09 MED ORDER — MIDAZOLAM HCL 5 MG/5ML IJ SOLN
INTRAMUSCULAR | Status: DC | PRN
  Administered 2019-10-09: .5 mg via INTRAVENOUS

## 2019-10-09 MED ORDER — OXYCODONE HCL 5 MG PO TABS: 5.0000 mg | ORAL_TABLET | ORAL | Status: DC | PRN

## 2019-10-09 MED ORDER — FENTANYL CITRATE (PF) 250 MCG/5ML IJ SOLN
INTRAMUSCULAR | Status: DC | PRN
  Administered 2019-10-09: 25 ug via INTRAVENOUS

## 2019-10-09 MED ORDER — EZETIMIBE 10 MG PO TABS
10.0000 mg | ORAL_TABLET | Freq: Every day | ORAL | Status: DC
  Administered 2019-10-10: 10 mg via ORAL
  Filled 2019-10-09: qty 1

## 2019-10-09 MED ORDER — MIDAZOLAM HCL 2 MG/2ML IJ SOLN
INTRAMUSCULAR | Status: AC
  Filled 2019-10-09: qty 2

## 2019-10-09 MED ORDER — ACETAMINOPHEN 650 MG RE SUPP: 650.0000 mg | Freq: Four times a day (QID) | RECTAL | Status: DC | PRN

## 2019-10-09 MED ORDER — PROMETHAZINE HCL 25 MG/ML IJ SOLN: 6.2500 mg | INTRAMUSCULAR | Status: DC | PRN

## 2019-10-09 MED ORDER — HEPARIN SODIUM (PORCINE) 5000 UNIT/ML IJ SOLN: 5000.0000 [IU] | Freq: Three times a day (TID) | INTRAMUSCULAR | Status: DC

## 2019-10-09 MED ORDER — ASPIRIN 81 MG PO TBEC
81.0000 mg | DELAYED_RELEASE_TABLET | Freq: Every day | ORAL | Status: DC
  Filled 2019-10-09: qty 1

## 2019-10-09 MED ORDER — ASPIRIN EC 81 MG PO TBEC
81.0000 mg | DELAYED_RELEASE_TABLET | Freq: Every day | ORAL | Status: DC
  Administered 2019-10-10 (×2): 81 mg via ORAL
  Filled 2019-10-09: qty 1

## 2019-10-09 MED ORDER — ONDANSETRON HCL 4 MG PO TABS: 4.0000 mg | ORAL_TABLET | Freq: Three times a day (TID) | ORAL | Status: DC | PRN

## 2019-10-09 MED ORDER — LIDOCAINE 5 % EX PTCH: 1.0000 | MEDICATED_PATCH | Freq: Every day | CUTANEOUS | Status: DC | PRN

## 2019-10-09 MED ORDER — VITAMIN E 180 MG (400 UNIT) PO CAPS
400.0000 [IU] | ORAL_CAPSULE | Freq: Every day | ORAL | Status: DC
  Administered 2019-10-10: 400 [IU] via ORAL
  Filled 2019-10-09 (×3): qty 1

## 2019-10-09 MED ORDER — SODIUM CHLORIDE 0.9 % IV SOLN
INTRAVENOUS | Status: DC | PRN
  Administered 2019-10-09: 500 mL

## 2019-10-09 MED ORDER — DOCUSATE SODIUM 100 MG PO CAPS
100.0000 mg | ORAL_CAPSULE | Freq: Every day | ORAL | Status: DC
  Administered 2019-10-10: 100 mg via ORAL
  Filled 2019-10-09: qty 1

## 2019-10-09 MED ORDER — SEVELAMER CARBONATE 800 MG PO TABS
800.0000 mg | ORAL_TABLET | Freq: Three times a day (TID) | ORAL | Status: DC
  Administered 2019-10-10 (×3): 800 mg via ORAL
  Filled 2019-10-09 (×4): qty 1

## 2019-10-09 MED ORDER — MEPERIDINE HCL 25 MG/ML IJ SOLN: 6.2500 mg | INTRAMUSCULAR | Status: DC | PRN

## 2019-10-09 MED ORDER — CLOPIDOGREL BISULFATE 75 MG PO TABS
75.0000 mg | ORAL_TABLET | Freq: Every day | ORAL | Status: DC
  Administered 2019-10-10 – 2019-10-11 (×2): 75 mg via ORAL
  Filled 2019-10-09 (×2): qty 1

## 2019-10-09 MED ORDER — DEXTROSE 50 % IV SOLN
INTRAVENOUS | Status: AC
  Administered 2019-10-09: 12.5 g via INTRAVENOUS
  Filled 2019-10-09: qty 50

## 2019-10-09 MED ORDER — PREDNISONE 10 MG PO TABS
10.0000 mg | ORAL_TABLET | Freq: Every day | ORAL | Status: DC
  Administered 2019-10-10 – 2019-10-11 (×2): 10 mg via ORAL
  Filled 2019-10-09 (×2): qty 1

## 2019-10-09 MED ORDER — FENTANYL CITRATE (PF) 250 MCG/5ML IJ SOLN
INTRAMUSCULAR | Status: AC
  Filled 2019-10-09: qty 5

## 2019-10-09 MED ORDER — MIDODRINE HCL 5 MG PO TABS
10.0000 mg | ORAL_TABLET | Freq: Three times a day (TID) | ORAL | Status: DC
  Administered 2019-10-10 – 2019-10-11 (×4): 10 mg via ORAL
  Filled 2019-10-09 (×5): qty 2

## 2019-10-09 MED ORDER — INSULIN ASPART 100 UNIT/ML ~~LOC~~ SOLN: 0.0000 [IU] | Freq: Three times a day (TID) | SUBCUTANEOUS | Status: DC

## 2019-10-09 MED ORDER — MIDAZOLAM HCL 2 MG/2ML IJ SOLN: 0.5000 mg | Freq: Once | INTRAMUSCULAR | Status: DC | PRN

## 2019-10-09 MED ORDER — HEPARIN SODIUM (PORCINE) 5000 UNIT/ML IJ SOLN
5000.0000 [IU] | Freq: Two times a day (BID) | INTRAMUSCULAR | Status: DC
  Administered 2019-10-09 – 2019-10-10 (×3): 5000 [IU] via SUBCUTANEOUS
  Filled 2019-10-09 (×3): qty 1

## 2019-10-09 MED ORDER — CALCITRIOL 0.25 MCG PO CAPS
0.2500 ug | ORAL_CAPSULE | ORAL | Status: DC
  Administered 2019-10-10: 0.25 ug via ORAL
  Filled 2019-10-09: qty 1

## 2019-10-09 MED ORDER — FENTANYL CITRATE (PF) 100 MCG/2ML IJ SOLN: 25.0000 ug | INTRAMUSCULAR | Status: DC | PRN

## 2019-10-09 SURGICAL SUPPLY — 32 items
ARMBAND PINK RESTRICT EXTREMIT (MISCELLANEOUS) ×3 IMPLANT
CANISTER SUCT 3000ML PPV (MISCELLANEOUS) ×3 IMPLANT
CANNULA VESSEL 3MM 2 BLNT TIP (CANNULA) ×3 IMPLANT
CLIP VESOCCLUDE MED 6/CT (CLIP) ×3 IMPLANT
CLIP VESOCCLUDE SM WIDE 6/CT (CLIP) ×3 IMPLANT
COVER PROBE W GEL 5X96 (DRAPES) IMPLANT
COVER WAND RF STERILE (DRAPES) ×3 IMPLANT
DERMABOND ADVANCED (GAUZE/BANDAGES/DRESSINGS) ×2
DERMABOND ADVANCED .7 DNX12 (GAUZE/BANDAGES/DRESSINGS) ×1 IMPLANT
DRSG MEPILEX BORDER 4X4 (GAUZE/BANDAGES/DRESSINGS) ×2 IMPLANT
ELECT REM PT RETURN 9FT ADLT (ELECTROSURGICAL) ×3
ELECTRODE REM PT RTRN 9FT ADLT (ELECTROSURGICAL) ×1 IMPLANT
GLOVE BIO SURGEON STRL SZ7.5 (GLOVE) ×3 IMPLANT
GLOVE BIOGEL PI IND STRL 8 (GLOVE) ×1 IMPLANT
GLOVE BIOGEL PI INDICATOR 8 (GLOVE) ×2
GOWN STRL REUS W/ TWL LRG LVL3 (GOWN DISPOSABLE) ×3 IMPLANT
GOWN STRL REUS W/TWL LRG LVL3 (GOWN DISPOSABLE) ×9
GRAFT GORETEX STND 6X20 (Vascular Products) ×3 IMPLANT
GRAFT GORETEXSTD 6X20 (Vascular Products) IMPLANT
KIT BASIN OR (CUSTOM PROCEDURE TRAY) ×3 IMPLANT
KIT TURNOVER KIT B (KITS) ×3 IMPLANT
NS IRRIG 1000ML POUR BTL (IV SOLUTION) ×3 IMPLANT
PACK CV ACCESS (CUSTOM PROCEDURE TRAY) ×3 IMPLANT
PAD ARMBOARD 7.5X6 YLW CONV (MISCELLANEOUS) ×6 IMPLANT
SPONGE SURGIFOAM ABS GEL 100 (HEMOSTASIS) IMPLANT
SUT PROLENE 6 0 BV (SUTURE) ×3 IMPLANT
SUT VIC AB 3-0 SH 27 (SUTURE) ×3
SUT VIC AB 3-0 SH 27X BRD (SUTURE) ×1 IMPLANT
SUT VICRYL 4-0 PS2 18IN ABS (SUTURE) ×3 IMPLANT
TOWEL GREEN STERILE (TOWEL DISPOSABLE) ×3 IMPLANT
UNDERPAD 30X30 (UNDERPADS AND DIAPERS) ×3 IMPLANT
WATER STERILE IRR 1000ML POUR (IV SOLUTION) ×3 IMPLANT

## 2019-10-09 NOTE — Op Note (Signed)
    NAME: [name].    MRN: [id number] DOB: 08-Jan-1946    DATE OF OPERATION: 10/09/2019  PREOP DIAGNOSIS:    Steal syndrome left upper extremity  POSTOP DIAGNOSIS:    Same  PROCEDURE:    Banding of left upper arm fistula  SURGEON: Judeth Cornfield. Scot Dock, MD  ASSIST: Tremont Cellar, RNFA  ANESTHESIA: Local with sedation  EBL: Minimal  INDICATIONS:    [name]. is a 74 y.o. male who presented with paresthesias and pain in the left hand consistent with steal.  He has a left upper arm fistula.  I recommended banding.  FINDINGS:   Palpable left radial pulse at the completion of the procedure with a good thrill remaining in the fistula.  TECHNIQUE:   The patient was taken to the operating room and sedated.  He was significantly short of breath so we did the procedure with him sitting up significantly.  The left arm was prepped and draped in usual sterile fashion.  After the skin was anesthetized with function lidocaine a longitudinal incision was made over the proximal fistula.  Here the fistula was dissected free.  I took a segment of PTFE graft and opened this longitudinally.  It was then wrapped around the proximal fistula and then sewn back together narrowing the fistula slightly over a length of approximately 1-1/2 cm.  I was careful to preserve a decent thrill in the fistula.  Thus I did not want to narrow this too much.  At the completion was a palpable radial pulse.  There was still a good thrill in the fistula.  Hemostasis was obtained in the wound and the wound was closed with a deep layer of 3-0 Vicryl and the skin closed with 4-0 Vicryl.  Dermabond was applied.  Patient tolerated procedure well was transferred to recovery room in stable condition.  All needle and sponge counts were correct.  [name], MD, FACS [facility]  DATE OF DICTATION:   10/09/2019

## 2019-10-09 NOTE — Progress Notes (Signed)
  Progress Note    10/09/2019 5:17 PM Day of Surgery  Subjective:  Resting comfortably in PACU    Vitals:   10/09/19 1620 10/09/19 1650  BP: (!) 102/54 115/62  Pulse: 85 85  Resp: 20 (!) 24  Temp:    SpO2: 93% 95%    Physical Exam: Cardiac:  RRR Lungs:  nonlabored Incisions:  [email or web address] Extremities:  Good thrill in fistula   CBC    Component Value Date/Time   WBC 13.9 (H) 10/09/2019 1111   RBC 3.55 (L) 10/09/2019 1111   HGB 10.3 (L) 10/09/2019 1111   HGB 8.2 (L) 11/23/2018 0958   HGB 14.3 07/20/2016 1609   HCT 33.5 (L) 10/09/2019 1111   HCT 41.5 07/20/2016 1609   PLT 232 10/09/2019 1111   PLT 115 (L) 11/23/2018 0958   PLT 188 07/20/2016 1609   MCV 94.4 10/09/2019 1111   MCV 91 07/20/2016 1609   MCH 29.0 10/09/2019 1111   MCHC 30.7 10/09/2019 1111   RDW 18.4 (H) 10/09/2019 1111   RDW 14.1 07/20/2016 1609   LYMPHSABS 2,321 09/22/2019 1640   MONOABS 0.3 09/07/2019 0811   EOSABS 191 09/22/2019 1640   BASOSABS 21 09/22/2019 1640    BMET    Component Value Date/Time   NA 135 10/09/2019 1111   NA 139 07/20/2016 1609   K 4.4 10/09/2019 1111   CL 98 10/09/2019 1111   CO2 21 (L) 10/09/2019 1111   GLUCOSE 172 (H) 10/09/2019 1111   BUN 36 (H) 10/09/2019 1111   BUN 23 07/20/2016 1609   CREATININE 6.10 (H) 10/09/2019 1111   CREATININE 5.84 (H) 09/22/2019 1640   CALCIUM 8.2 (L) 10/09/2019 1111   GFRNONAA 8 (L) 10/09/2019 1111   GFRNONAA 9 (L) 09/22/2019 1640   GFRAA 10 (L) 10/09/2019 1111   GFRAA 10 (L) 09/22/2019 1640     Intake/Output Summary (Last 24 hours) at 10/09/2019 1717 Last data filed at 10/09/2019 1502 Gross per 24 hour  Intake 100 ml  Output 2446 ml  Net -2346 ml    HOSPITAL MEDICATIONS Scheduled Meds: . calcitRIOL  0.25 mcg Oral Q T,Th,Sa-HD  . chlorhexidine  60 mL Topical Once   And  . [START ON 10/10/2019] chlorhexidine  60 mL Topical Once   Continuous Infusions: . sodium chloride     PRN Meds:.fentaNYL (SUBLIMAZE) injection,  meperidine (DEMEROL) injection, midazolam, promethazine  Assessment:  74 y.o. male is s/p:  Banding left upper extremity  AVF. Hypotensive during dialysis.  Day of Surgery  Plan: -Rec: observation overnight.  I paged [facility] and they will admit. Dr. Joelyn Oms ([facility]) is aware of admission.     Risa Grill, PA-C [facility] [phone] 10/09/2019  5:17 PM

## 2019-10-09 NOTE — Progress Notes (Signed)
Patient arrived back to [facility] PACU from dialysis at 1546 to hold while waiting for admission. Patient presents much more comfortably and resting in bay 8.  Will continue to monitor per PACU protocols until transferred to inpatient room.  Renee Pain

## 2019-10-09 NOTE — Anesthesia Procedure Notes (Signed)
Procedure Name: MAC Date/Time: 10/09/2019 9:55 AM Performed by: Amadeo Garnet, CRNA Pre-anesthesia Checklist: Patient identified, Emergency Drugs available, Suction available and Patient being monitored Patient Re-evaluated:Patient Re-evaluated prior to induction Oxygen Delivery Method: Simple face mask Preoxygenation: Pre-oxygenation with 100% oxygen Induction Type: IV induction Placement Confirmation: positive ETCO2 Dental Injury: Teeth and Oropharynx as per pre-operative assessment

## 2019-10-09 NOTE — Interval H&P Note (Signed)
History and Physical Interval Note:  10/09/2019 8:57 AM  [name].  has presented today for surgery, with the diagnosis of END STAGE RENAL DISEASE.  The various methods of treatment have been discussed with the patient and family. After consideration of risks, benefits and other options for treatment, the patient has consented to  Procedure(s): BANDING OF ARTERIOVENOUS FISTULA (Left) as a surgical intervention.  The patient's history has been reviewed, patient examined, no change in status, stable for surgery.  I have reviewed the patient's chart and labs.  Questions were answered to the patient's satisfaction.     [name]

## 2019-10-09 NOTE — Procedures (Signed)
I was present at this dialysis session. I have reviewed the session itself and made appropriate changes.   Vital signs in last 24 hours:  Temp:  [97.7 F (36.5 C)-98.8 F (37.1 C)] 98.8 F (37.1 C) (04/12 1120) Pulse Rate:  [93-115] 94 (04/12 1400) Resp:  [37-40] 40 (04/12 1100) BP: (84-187)/(51-98) 84/52 (04/12 1400) SpO2:  [98 %-100 %] 99 % (04/12 1120) Weight:  [68 kg] 68 kg (04/12 1120) Weight change:  Filed Weights   10/09/19 0755 10/09/19 1120  Weight: 68 kg 68 kg    Recent Labs  Lab 10/09/19 1111  NA 135  K 4.4  CL 98  CO2 21*  GLUCOSE 172*  BUN 36*  CREATININE 6.10*  CALCIUM 8.2*  PHOS 6.7*    Recent Labs  Lab 10/09/19 0827 10/09/19 1111  WBC  --  13.9*  HGB 9.9* 10.3*  HCT 29.0* 33.5*  MCV  --  94.4  PLT  --  232    Scheduled Meds: . calcitRIOL  0.25 mcg Oral Q T,Th,Sa-HD  . chlorhexidine  60 mL Topical Once   And  . [START ON 10/10/2019] chlorhexidine  60 mL Topical Once   Continuous Infusions: . sodium chloride     PRN Meds:.fentaNYL (SUBLIMAZE) injection, meperidine (DEMEROL) injection, midazolam, promethazine   Donetta Potts,  MD 10/09/2019, 2:13 PM

## 2019-10-09 NOTE — Transfer of Care (Signed)
Immediate Anesthesia Transfer of Care Note  Patient: [name].  Procedure(s) Performed: BANDING OF ARTERIOVENOUS FISTULA (Left Arm Upper)  Patient Location: PACU  Anesthesia Type:MAC  Level of Consciousness: awake, alert  and oriented  Airway & Oxygen Therapy: Patient Spontanous Breathing and Patient connected to face mask oxygen  Post-op Assessment: Report given to RN, Post -op Vital signs reviewed and stable and Patient moving all extremities  Post vital signs: Reviewed and stable  Last Vitals:  Vitals Value Taken Time  BP 178/91 10/09/19 1027  Temp    Pulse    Resp 32 10/09/19 1030  SpO2    Vitals shown include unvalidated device data.  Last Pain:  Vitals:   10/09/19 0737  TempSrc: Oral  PainSc: 8       Patients Stated Pain Goal: 3 (16/96/78 9381)  Complications: No apparent anesthesia complications
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
## 2019-10-09 NOTE — Anesthesia Postprocedure Evaluation (Signed)
Anesthesia Post Note  Patient: [name].  Procedure(s) Performed: BANDING OF ARTERIOVENOUS FISTULA (Left Arm Upper)     Patient location during evaluation: PACU Anesthesia Type: MAC Level of consciousness: awake and alert, oriented and patient cooperative Pain management: pain level controlled Vital Signs Assessment: post-procedure vital signs reviewed and stable Respiratory status: spontaneous breathing, patient connected to face mask oxygen and patient connected to nasal cannula oxygen (pt with CHF and rales, D/C for urgent dialysis) Cardiovascular status: blood pressure returned to baseline and stable Postop Assessment: no headache Anesthetic complications: no    Last Vitals:  Vitals:   10/09/19 1200 10/09/19 1230  BP: (!) 145/75 106/67  Pulse: (!) 108 (!) 101  Resp:    Temp:    SpO2:      Last Pain:  Vitals:   10/09/19 1120  TempSrc: Axillary  PainSc:                  Aydden Cumpian,E. Bridgett Hattabaugh

## 2019-10-09 NOTE — Progress Notes (Signed)
Updated daughter that pt stable and improved since HD and will be staying overnight per Risa Grill PA recommendation. Awaiting consult from PA to get patient bed request placed, called to verify this was plan with PA while in OR 16 and she confirmed.  Renee Pain, RN

## 2019-10-09 NOTE — Progress Notes (Signed)
  Day of Surgery Note    Subjective:  "I feel better".  Discussed with attendant RN in inpt HD unit. BP better as is breathing Treatment going well via Left AVF  Vitals:   10/09/19 1345 10/09/19 1400  BP: (!) 88/60 (!) 84/52  Pulse: 93 94  Resp:    Temp:    SpO2:     A and O. No dyspnea. Incisions:   Dry/intact Cardiac:  RRR Lungs:  Non labored, no rales    Assessment/Plan:  This is a 74 y.o. male who is s/p banding LUE AVF.  Inpt HD due to SOB.  Sx improved Anticipate DC after treatment.  I advised him to proceed to his regular treatment schedule tomorrow  -   Risa Grill, PA-C 10/09/2019 2:46 PM [phone]
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
## 2019-10-10 ENCOUNTER — Observation Stay (HOSPITAL_COMMUNITY): Payer: Medicare HMO

## 2019-10-10 DIAGNOSIS — I132 Hypertensive heart and chronic kidney disease with heart failure and with stage 5 chronic kidney disease, or end stage renal disease: Secondary | ICD-10-CM | POA: Diagnosis not present

## 2019-10-10 DIAGNOSIS — T82898D Other specified complication of vascular prosthetic devices, implants and grafts, subsequent encounter: Secondary | ICD-10-CM | POA: Diagnosis not present

## 2019-10-10 DIAGNOSIS — I5030 Unspecified diastolic (congestive) heart failure: Secondary | ICD-10-CM | POA: Diagnosis not present

## 2019-10-10 DIAGNOSIS — J449 Chronic obstructive pulmonary disease, unspecified: Secondary | ICD-10-CM | POA: Diagnosis not present

## 2019-10-10 DIAGNOSIS — I2510 Atherosclerotic heart disease of native coronary artery without angina pectoris: Secondary | ICD-10-CM | POA: Diagnosis not present

## 2019-10-10 DIAGNOSIS — J810 Acute pulmonary edema: Secondary | ICD-10-CM

## 2019-10-10 DIAGNOSIS — I480 Paroxysmal atrial fibrillation: Secondary | ICD-10-CM | POA: Diagnosis not present

## 2019-10-10 DIAGNOSIS — J9621 Acute and chronic respiratory failure with hypoxia: Secondary | ICD-10-CM | POA: Diagnosis not present

## 2019-10-10 DIAGNOSIS — R0602 Shortness of breath: Secondary | ICD-10-CM | POA: Diagnosis not present

## 2019-10-10 DIAGNOSIS — N186 End stage renal disease: Secondary | ICD-10-CM | POA: Diagnosis not present

## 2019-10-10 DIAGNOSIS — T82898A Other specified complication of vascular prosthetic devices, implants and grafts, initial encounter: Secondary | ICD-10-CM | POA: Diagnosis not present

## 2019-10-10 DIAGNOSIS — M1990 Unspecified osteoarthritis, unspecified site: Secondary | ICD-10-CM | POA: Diagnosis not present

## 2019-10-10 DIAGNOSIS — Z992 Dependence on renal dialysis: Secondary | ICD-10-CM | POA: Diagnosis not present

## 2019-10-10 DIAGNOSIS — T82838D Hemorrhage of vascular prosthetic devices, implants and grafts, subsequent encounter: Secondary | ICD-10-CM | POA: Diagnosis not present

## 2019-10-10 DIAGNOSIS — I5032 Chronic diastolic (congestive) heart failure: Secondary | ICD-10-CM | POA: Diagnosis not present

## 2019-10-10 DIAGNOSIS — D631 Anemia in chronic kidney disease: Secondary | ICD-10-CM | POA: Diagnosis not present

## 2019-10-10 DIAGNOSIS — I739 Peripheral vascular disease, unspecified: Secondary | ICD-10-CM | POA: Diagnosis not present

## 2019-10-10 DIAGNOSIS — E1122 Type 2 diabetes mellitus with diabetic chronic kidney disease: Secondary | ICD-10-CM | POA: Diagnosis not present

## 2019-10-10 LAB — GLUCOSE, CAPILLARY
Glucose-Capillary: 62 mg/dL — ABNORMAL LOW (ref 70–99)
Glucose-Capillary: 143 mg/dL — ABNORMAL HIGH (ref 70–99)
Glucose-Capillary: 82 mg/dL (ref 70–99)
Glucose-Capillary: 130 mg/dL — ABNORMAL HIGH (ref 70–99)
Glucose-Capillary: 105 mg/dL — ABNORMAL HIGH (ref 70–99)

## 2019-10-10 LAB — BASIC METABOLIC PANEL
Anion gap: 12 (ref 5–15)
BUN: 19 mg/dL (ref 8–23)
CO2: 28 mmol/L (ref 22–32)
Calcium: 8.2 mg/dL — ABNORMAL LOW (ref 8.9–10.3)
Chloride: 98 mmol/L (ref 98–111)
Creatinine, Ser: 4.28 mg/dL — ABNORMAL HIGH (ref 0.61–1.24)
GFR calc Af Amer: 15 mL/min — ABNORMAL LOW (ref >60)
GFR calc non Af Amer: 13 mL/min — ABNORMAL LOW (ref >60)
Glucose, Bld: 67 mg/dL — ABNORMAL LOW (ref 70–99)
Potassium: 4.2 mmol/L (ref 3.5–5.1)
Sodium: 138 mmol/L (ref 135–145)

## 2019-10-10 MED ORDER — CHLORHEXIDINE GLUCONATE CLOTH 2 % EX PADS
6.0000 | MEDICATED_PAD | Freq: Every day | CUTANEOUS | Status: DC
  Administered 2019-10-10 – 2019-10-11 (×2): 6 via TOPICAL

## 2019-10-10 NOTE — Progress Notes (Signed)
Hypoglycemic Event  CBG: 64  Treatment: juice and crackers  Symptoms: none  Follow-up CBG: Time:0745  CBG Result:143  Possible Reasons for Event: pt reports not eating supper or breakfast this AM  Comments/MD notified:pt remained asymptomatic, breakfast tray ordered   [place]

## 2019-10-10 NOTE — Plan of Care (Signed)

## 2019-10-10 NOTE — Discharge Summary (Signed)
Physician Discharge Summary  [name]. KGM:[id number] DOB: [birth year] DOA: 10/09/2019  PCP: [name], MD  Admit date: 10/09/2019 Discharge date: 10/10/2019  Admitted From: Home Disposition:  Home  Recommendations for Outpatient Follow-up:  1. Follow up with Vascular surgery 2-3 weeks  2. HD TTSa   Discharge Condition: Stable CODE STATUS: Full  Diet recommendation: Renal diet   Brief/Interim Summary: From H&P by Dr. Roosevelt Locks: "[name]. is a 74 y.o. 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 PMH significant for ESRD on HD TTS, orthostatic hypotension on midodrine, COPD, ILD, persistent AFIB, CAD, DMT2, HTN, GIB, PAD, enucleation L eye, AOCD, who presented to [facility] for banding of AVF due to newly developed clotting problem in March this year. His last HD was two days ago. After the procedure, the patient was noted to have SOB. CXR showed choric ILD/fibrosis with superimposed edema. Patient denied any cough or chest pain. Neprhology was emergently called in and urgent HD performed and [name]'s symptoms improved as HD as progressed. But his BP remained low after HD and continue to require 2 L O2 thus decision was made to observe the patient overnight in the hospital."  Patient feeling well this morning. States that he uses O2 PRN at home, 2-2.5L. Denies SOB this morning and ready for discharge home.   Discharge Diagnoses:  Principal Problem:   Acute on chronic respiratory failure with hypoxia ([name]) Active Problems:   Essential hypertension   CAD, NATIVE VESSEL   PVD   COPD (chronic obstructive pulmonary disease) ([name])   Type II diabetes mellitus with renal manifestations ([name])   CAD (coronary artery disease)   ESRD on dialysis ([name])   ILD (interstitial lung disease) ([name])   Atrial fibrillation ([name])   Hypotension   SOB (shortness of breath)   Acute pulmonary edema ([name])   Steal syndrome dialysis vascular access [facility])   Discharge  Instructions  Discharge Instructions    Call MD for:  redness, tenderness, or signs of infection (pain, swelling, bleeding, redness, odor or green/yellow discharge around incision site)   Complete by: As directed    Call MD for:  severe or increased pain, loss or decreased feeling  in affected limb(s)   Complete by: As directed    Call MD for:  temperature >100.5   Complete by: As directed    Discharge instructions   Complete by: As directed    ACTIVITY: Rest today. You may resume her normal activity tomorrow.  DIET: Resume your previous diet.  WOUND CARE: If you have a dressing, this can be removed in 48 hours. Otherwise, keep your incisions clean and dry. Elevate the affected limb on a pillow. You may shower starting 48 hours after your surgery.  SPECIAL INSTRUCTIONS: You will have mild to moderate discomfort at the incision and graft site. Call your doctor for: persistent or heavy bleeding at the surgical site, increasing redness or swelling of the incision, a temperature greater than 101, severe pain or loss of feeling in the hand or foot on the side of surgery.  FOLLOW-UP: The office will call to arrange a follow-up visit in 2 to 3 weeks.  Call sooner if problems.  [facility]  OFFICE NUMBER: [phone]   Discharge instructions   Complete by: As directed    You were cared for by a hospitalist during your hospital stay. If you have any questions about your discharge medications or the care you received while you were in the  hospital after you are discharged, you can call the unit and ask to speak with the hospitalist on call if the hospitalist that took care of you is not available. Once you are discharged, your primary care physician will handle any further medical issues. Please note that NO REFILLS for any discharge medications will be authorized once you are discharged, as it is imperative that you return to your primary care physician (or establish a relationship  with a primary care physician if you do not have one) for your aftercare needs so that they can reassess your need for medications and monitor your lab values.   Increase activity slowly   Complete by: As directed    Resume previous diet   Complete by: As directed      Allergies as of 10/10/2019      Reactions   Gelatin Other (See Comments)   ALPHA-GAL DANGER   Meat [alpha-gal] Other (See Comments)   REACTION TO HOOVED ANIMALS PARTICULARLY RED MEAT   Pork-derived Products Other (See Comments)   ALPHA-GAL DANGER   Shellfish Allergy Shortness Of Breath   Chicken Allergy Nausea And Vomiting   Ramipril Swelling   Tongue and throat swelling   Betaine Itching   Dextromethorphan-guaifenesin Swelling, Nausea And Vomiting   Other Other (See Comments)   Codeine Nausea And Vomiting   Morphine Itching      Medication List    STOP taking these medications   tiZANidine 2 MG tablet Commonly known as: ZANAFLEX   Tresiba FlexTouch 100 UNIT/ML FlexTouch Pen Generic drug: insulin degludec     TAKE these medications   amiodarone 200 MG tablet Commonly known as: PACERONE Take 1 tablet (200 mg total) by mouth daily.   aspirin 81 MG EC tablet Take 1 tablet (81 mg total) by mouth daily.   atorvastatin 80 MG tablet Commonly known as: LIPITOR TAKE 1 TABLET AT BEDTIME   clopidogrel 75 MG tablet Commonly known as: PLAVIX Take 1 tablet (75 mg total) by mouth daily with breakfast.   docusate sodium 100 MG capsule Commonly known as: COLACE Take 100 mg by mouth daily.   ezetimibe 10 MG tablet Commonly known as: ZETIA TAKE 1 TABLET BY MOUTH EVERY DAY   insulin aspart 100 UNIT/ML injection Commonly known as: novoLOG Inject 0-5 Units into the skin 3 (three) times daily with meals. CBG 181-200:1 unit,CBG 201-250:2 units.CBG 251-300:3 units.CBG 301-350:5 U What changed:   how much to take  when to take this  reasons to take this  additional instructions   lidocaine 5 % Commonly  known as: Lidoderm Place 1 patch onto the skin daily. Remove & Discard patch within 12 hours or as directed by MD What changed:   when to take this  reasons to take this   midodrine 10 MG tablet Commonly known as: PROAMATINE Take 1 tablet (10 mg total) by mouth 3 (three) times daily with meals.   ondansetron 4 MG tablet Commonly known as: Zofran Take 1 tablet (4 mg total) by mouth every 8 (eight) hours as needed for nausea or vomiting.   oxyCODONE 5 MG immediate release tablet Commonly known as: Roxicodone Take 1 tablet (5 mg total) by mouth every 4 (four) hours as needed.   oxyCODONE-acetaminophen 10-325 MG tablet Commonly known as: PERCOCET Take 1 tablet by mouth every 4 (four) hours as needed for pain.   polyethylene glycol 17 g packet Commonly known as: MIRALAX / GLYCOLAX Take 17 g by mouth 2 (two) times daily as needed  for moderate constipation. What changed: when to take this   predniSONE 10 MG tablet Commonly known as: DELTASONE Take 1 tablet (10 mg total) by mouth daily with breakfast.   sevelamer carbonate 800 MG tablet Commonly known as: RENVELA Take 800 mg by mouth 3 (three) times daily with meals.   vitamin E 180 MG (400 UNITS) capsule Take 400 Units by mouth daily.      Follow-up Information    Angelia Mould, MD Follow up.   Specialties: Vascular Surgery, Cardiology Contact information: [address] [place] [address] [phone]          Allergies  Allergen Reactions  . Gelatin Other (See Comments)    ALPHA-GAL DANGER  . Meat [Alpha-Gal] Other (See Comments)    REACTION TO HOOVED ANIMALS PARTICULARLY RED MEAT  . Pork-Derived Products Other (See Comments)    ALPHA-GAL DANGER  . Shellfish Allergy Shortness Of Breath  . Chicken Allergy Nausea And Vomiting  . Ramipril Swelling    Tongue and throat swelling  . Betaine Itching  . Dextromethorphan-Guaifenesin Swelling and Nausea And Vomiting  . Other Other (See Comments)  .  Codeine Nausea And Vomiting  . Morphine Itching    Consultations:  Vascular surgery  Nephrology    Procedures/Studies: DG CHEST PORT 1 VIEW  Result Date: 10/10/2019 CLINICAL DATA:  Shortness of breath EXAM: PORTABLE CHEST 1 VIEW COMPARISON:  Yesterday FINDINGS: Stable, symmetric bilateral interstitial opacity. Normal heart size and stable mediastinal contours. Blunting of the lateral right costophrenic sulcus. No pneumothorax. IMPRESSION: Continued interstitial opacity likely reflecting pulmonary edema superimposed on the patient's emphysema. Electronically Signed   By: Monte Fantasia M.D.   On: 10/10/2019 07:04   DG Chest Port 1 View  Result Date: 10/09/2019 CLINICAL DATA:  Preop fistula, shortness of breath EXAM: PORTABLE CHEST 1 VIEW COMPARISON:  09/01/2019 FINDINGS: Heart is borderline in size. Diffuse interstitial opacities throughout the lungs. This has progressed since prior study. No effusions or pneumothorax. No acute bony abnormality. IMPRESSION: Progressive diffuse interstitial disease. A component of this likely reflects chronic interstitial lung disease/fibrosis, but this has progressed since prior study and there likely is a component of superimposed edema. Electronically Signed   By: Rolm Baptise M.D.   On: 10/09/2019 09:38   VAS [place] STEAL EXAM  Result Date: 10/04/2019 DIALYSIS STEAL Reason for Exam: Numbness. Access Site: Left Upper Extremity. Access Type: Brachial-cephalic AVF. History: Patient complains that his 3rd, 4th, and 5th finger on the left hand          become numb during dialysis. Performing Technologist: Delorise Shiner RVT  Examination Guidelines: A complete evaluation includes B-mode imaging, spectral Doppler, color Doppler, and power Doppler as needed of all accessible portions of each vessel. Bilateral testing is considered an integral part of a complete examination. Limited examinations for reoccurring indications may be performed as noted.  Findings:   +------------+-----------------+-------------------+--------+             PPG w compressionPPG w/o compressionComments +------------+-----------------+-------------------+--------+ Left Digit 2    increased          normal                +------------+-----------------+-------------------+--------+             PSV w compressionPSV w/o compression         +------------+-----------------+-------------------+--------+ Summary: Left radial pressure increase from 101 mm HG to 146 mm Hg with AVF compression. Left second digit pressure increased from 70 mm Hg to 124  mm Hg with AVF compression. Distal pressures (radial artery and second digit) increase with AVF compression. *See table(s) above for measurements and observations. Diagnosing physician: [name] MD Electronically signed by [name] MD on 10/04/2019 at 9:44:41 AM.    Final       Discharge Exam: Vitals:   10/10/19 0343 10/10/19 0751  BP: 123/61 (!) 102/52  Pulse: 83 84  Resp: 20 16  Temp: 97.7 F (36.5 C) 97.6 F (36.4 C)  SpO2: 96% 98%    General: [name] is alert, awake, not in acute distress Cardiovascular: S1/S2 +, no edema Respiratory: CTA bilaterally, no wheezing, no rhonchi, no respiratory distress, no conversational dyspnea, on 2L [place] O2  Abdominal: Soft, NT, ND, bowel sounds + Extremities: no edema, no cyanosis Psych: Normal mood and affect, stable judgement and insight     The results of significant diagnostics from this hospitalization (including imaging, microbiology, ancillary and laboratory) are listed below for reference.     Microbiology: Recent Results (from the past 240 hour(s))  SARS CORONAVIRUS 2 (TAT 6-24 HRS) Nasopharyngeal Nasopharyngeal Swab     Status: None   Collection Time: 10/05/19  6:56 AM   Specimen: Nasopharyngeal Swab  Result Value Ref Range Status   SARS Coronavirus 2 NEGATIVE NEGATIVE Final    Comment: (NOTE) SARS-CoV-2 target nucleic acids are NOT DETECTED. The  SARS-CoV-2 RNA is generally detectable in upper and lower respiratory specimens during the acute phase of infection. Negative results do not preclude SARS-CoV-2 infection, do not rule out co-infections with other pathogens, and should not be used as the sole basis for treatment or other patient management decisions. Negative results must be combined with clinical observations, patient history, and epidemiological information. The expected result is Negative. Fact Sheet for Patients: SugarRoll.be Fact Sheet for Healthcare Providers: https://www.woods-mathews.com/ This test is not yet approved or cleared by the [place] FDA and  has been authorized for detection and/or diagnosis of SARS-CoV-2 by FDA under an Emergency Use Authorization (EUA). This EUA will remain  in effect (meaning this test can be used) for the duration of the COVID-19 declaration under Section 56 4(b)(1) of the Act, 21 U.S.C. section 360bbb-3(b)(1), unless the authorization is terminated or revoked sooner. Performed at [facility], [place] [address] [place], [place] [address]      Labs: BNP (last 3 results) Recent Labs    10/26/18 1641 07/16/19 0700  BNP 859.8* 8,338.2*   Basic Metabolic Panel: Recent Labs  Lab 10/09/19 0827 10/09/19 1111 10/10/19 0410  NA 138 135 138  K 4.2 4.4 4.2  CL 101 98 98  CO2  --  21* 28  GLUCOSE 82 172* 67*  BUN 32* 36* 19  CREATININE 6.60* 6.10* 4.28*  CALCIUM  --  8.2* 8.2*  PHOS  --  6.7*  --    Liver Function Tests: Recent Labs  Lab 10/09/19 1111  ALBUMIN 2.8*   No results for input(s): LIPASE, AMYLASE in the last 168 hours. No results for input(s): AMMONIA in the last 168 hours. CBC: Recent Labs  Lab 10/09/19 0827 10/09/19 1111  WBC  --  13.9*  HGB 9.9* 10.3*  HCT 29.0* 33.5*  MCV  --  94.4  PLT  --  232   Cardiac Enzymes: No results for input(s): CKTOTAL, CKMB, CKMBINDEX, TROPONINI in the last 168  hours. BNP: Invalid input(s): POCBNP CBG: Recent Labs  Lab 10/09/19 0837 10/09/19 0932 10/09/19 2159 10/10/19 0641 10/10/19 0748  GLUCAP 123* 89 64* 62*  143*   D-Dimer No results for input(s): DDIMER in the last 72 hours. Hgb A1c No results for input(s): HGBA1C in the last 72 hours. Lipid Profile No results for input(s): CHOL, HDL, LDLCALC, TRIG, CHOLHDL, LDLDIRECT in the last 72 hours. Thyroid function studies No results for input(s): TSH, T4TOTAL, T3FREE, THYROIDAB in the last 72 hours.  Invalid input(s): FREET3 Anemia work up No results for input(s): VITAMINB12, FOLATE, FERRITIN, TIBC, IRON, RETICCTPCT in the last 72 hours. Urinalysis    Component Value Date/Time   COLORURINE YELLOW 11/05/2018 0601   APPEARANCEUR CLEAR 11/05/2018 0601   LABSPEC 1.012 11/05/2018 0601   PHURINE 7.0 11/05/2018 0601   GLUCOSEU NEGATIVE 11/05/2018 0601   HGBUR LARGE (A) 11/05/2018 0601   BILIRUBINUR NEGATIVE 11/05/2018 0601   KETONESUR NEGATIVE 11/05/2018 0601   PROTEINUR 100 (A) 11/05/2018 0601   NITRITE NEGATIVE 11/05/2018 0601   LEUKOCYTESUR NEGATIVE 11/05/2018 0601   Sepsis Labs Invalid input(s): PROCALCITONIN,  WBC,  LACTICIDVEN Microbiology Recent Results (from the past 240 hour(s))  SARS CORONAVIRUS 2 (TAT 6-24 HRS) Nasopharyngeal Nasopharyngeal Swab     Status: None   Collection Time: 10/05/19  6:56 AM   Specimen: Nasopharyngeal Swab  Result Value Ref Range Status   SARS Coronavirus 2 NEGATIVE NEGATIVE Final    Comment: (NOTE) SARS-CoV-2 target nucleic acids are NOT DETECTED. The SARS-CoV-2 RNA is generally detectable in upper and lower respiratory specimens during the acute phase of infection. Negative results do not preclude SARS-CoV-2 infection, do not rule out co-infections with other pathogens, and should not be used as the sole basis for treatment or other patient management decisions. Negative results must be combined with clinical observations, patient history,  and epidemiological information. The expected result is Negative. Fact Sheet for Patients: SugarRoll.be Fact Sheet for Healthcare Providers: https://www.woods-mathews.com/ This test is not yet approved or cleared by the [place] FDA and  has been authorized for detection and/or diagnosis of SARS-CoV-2 by FDA under an Emergency Use Authorization (EUA). This EUA will remain  in effect (meaning this test can be used) for the duration of the COVID-19 declaration under Section 56 4(b)(1) of the Act, 21 U.S.C. section 360bbb-3(b)(1), unless the authorization is terminated or revoked sooner. Performed at [facility], [place] [address] [place], [place] [address]      Patient was seen and examined on the day of discharge and was found to be in stable condition. Time coordinating discharge: 35 minutes including assessment and coordination of care, as well as examination of the patient.   SIGNED:  Dessa Phi, DO [facility] 10/10/2019, 11:02 AM

## 2019-10-10 NOTE — Progress Notes (Signed)
Pt aware of d/c orders today, pt is aware that he will go to dialysis  This evening (HD said around 1800). He states that his daughter can drive him home this evening. Pt family is aware of discharge and discharge plans for this evening. Will continue to monitor and pass along to night shift RN.

## 2019-10-10 NOTE — Plan of Care (Signed)

## 2019-10-11 ENCOUNTER — Telehealth (HOSPITAL_COMMUNITY): Payer: Self-pay | Admitting: Nephrology

## 2019-10-11 DIAGNOSIS — I2510 Atherosclerotic heart disease of native coronary artery without angina pectoris: Secondary | ICD-10-CM | POA: Diagnosis not present

## 2019-10-11 DIAGNOSIS — T82898A Other specified complication of vascular prosthetic devices, implants and grafts, initial encounter: Secondary | ICD-10-CM | POA: Diagnosis not present

## 2019-10-11 DIAGNOSIS — J449 Chronic obstructive pulmonary disease, unspecified: Secondary | ICD-10-CM | POA: Diagnosis not present

## 2019-10-11 DIAGNOSIS — Z992 Dependence on renal dialysis: Secondary | ICD-10-CM | POA: Diagnosis not present

## 2019-10-11 DIAGNOSIS — I5032 Chronic diastolic (congestive) heart failure: Secondary | ICD-10-CM | POA: Diagnosis not present

## 2019-10-11 DIAGNOSIS — I132 Hypertensive heart and chronic kidney disease with heart failure and with stage 5 chronic kidney disease, or end stage renal disease: Secondary | ICD-10-CM | POA: Diagnosis not present

## 2019-10-11 DIAGNOSIS — J9621 Acute and chronic respiratory failure with hypoxia: Secondary | ICD-10-CM | POA: Diagnosis not present

## 2019-10-11 DIAGNOSIS — E1122 Type 2 diabetes mellitus with diabetic chronic kidney disease: Secondary | ICD-10-CM | POA: Diagnosis not present

## 2019-10-11 DIAGNOSIS — N186 End stage renal disease: Secondary | ICD-10-CM | POA: Diagnosis not present

## 2019-10-11 LAB — GLUCOSE, CAPILLARY
Glucose-Capillary: 101 mg/dL — ABNORMAL HIGH (ref 70–99)
Glucose-Capillary: 63 mg/dL — ABNORMAL LOW (ref 70–99)

## 2019-10-11 NOTE — Telephone Encounter (Signed)
Transition of care contact from inpatient facility  Date of Discharge: 10/10/19  Date of Contact: 10/11/19 - attempted Method of contact: Phone  Attempted to contact patient to discuss transition of care from inpatient admission. Patient did not answer the phone. Message was left on the patient's voicemail with call back number [phone].  Veneta Penton, PA-C [employer] Pager [phone]

## 2019-10-11 NOTE — Progress Notes (Signed)
Pt blood sugar went down to 63. Pt treated with grape juice. Rechecked after 62mins. Pt blood glucose 101 as of time of this writing

## 2019-10-11 NOTE — Progress Notes (Signed)
[name]. to be D/C'd home per MD order.  Discussed prescriptions and follow up appointments with the patient. Prescriptions were sent to patient's preferred pharmacy, medication list explained in detail. Pt verbalized understanding.  Allergies as of 10/11/2019      Reactions   Gelatin Other (See Comments)   ALPHA-GAL DANGER   Meat [alpha-gal] Other (See Comments)   REACTION TO HOOVED ANIMALS PARTICULARLY RED MEAT   Pork-derived Products Other (See Comments)   ALPHA-GAL DANGER   Shellfish Allergy Shortness Of Breath   Chicken Allergy Nausea And Vomiting   Ramipril Swelling   Tongue and throat swelling   Betaine Itching   Dextromethorphan-guaifenesin Swelling, Nausea And Vomiting   Other Other (See Comments)   Codeine Nausea And Vomiting   Morphine Itching      Medication List    STOP taking these medications   tiZANidine 2 MG tablet Commonly known as: ZANAFLEX   Tresiba FlexTouch 100 UNIT/ML FlexTouch Pen Generic drug: insulin degludec     TAKE these medications   amiodarone 200 MG tablet Commonly known as: PACERONE Take 1 tablet (200 mg total) by mouth daily.   aspirin 81 MG EC tablet Take 1 tablet (81 mg total) by mouth daily.   atorvastatin 80 MG tablet Commonly known as: LIPITOR TAKE 1 TABLET AT BEDTIME   clopidogrel 75 MG tablet Commonly known as: PLAVIX Take 1 tablet (75 mg total) by mouth daily with breakfast.   docusate sodium 100 MG capsule Commonly known as: COLACE Take 100 mg by mouth daily.   ezetimibe 10 MG tablet Commonly known as: ZETIA TAKE 1 TABLET BY MOUTH EVERY DAY   insulin aspart 100 UNIT/ML injection Commonly known as: novoLOG Inject 0-5 Units into the skin 3 (three) times daily with meals. CBG 181-200:1 unit,CBG 201-250:2 units.CBG 251-300:3 units.CBG 301-350:5 U What changed:   how much to take  when to take this  reasons to take this  additional instructions   lidocaine 5 % Commonly known as: Lidoderm Place 1 patch  onto the skin daily. Remove & Discard patch within 12 hours or as directed by MD What changed:   when to take this  reasons to take this   midodrine 10 MG tablet Commonly known as: PROAMATINE Take 1 tablet (10 mg total) by mouth 3 (three) times daily with meals.   ondansetron 4 MG tablet Commonly known as: Zofran Take 1 tablet (4 mg total) by mouth every 8 (eight) hours as needed for nausea or vomiting.   oxyCODONE 5 MG immediate release tablet Commonly known as: Roxicodone Take 1 tablet (5 mg total) by mouth every 4 (four) hours as needed.   oxyCODONE-acetaminophen 10-325 MG tablet Commonly known as: PERCOCET Take 1 tablet by mouth every 4 (four) hours as needed for pain.   polyethylene glycol 17 g packet Commonly known as: MIRALAX / GLYCOLAX Take 17 g by mouth 2 (two) times daily as needed for moderate constipation. What changed: when to take this   predniSONE 10 MG tablet Commonly known as: DELTASONE Take 1 tablet (10 mg total) by mouth daily with breakfast.   sevelamer carbonate 800 MG tablet Commonly known as: RENVELA Take 800 mg by mouth 3 (three) times daily with meals.   vitamin E 180 MG (400 UNITS) capsule Take 400 Units by mouth daily.       Vitals:   10/11/19 0425 10/11/19 0758  BP: 138/68 (!) 106/56  Pulse: 78 70  Resp: 16 17  Temp: 98.2 F (36.8  C) 97.7 F (36.5 C)  SpO2: 98% 100%    IV catheter discontinued intact. Site without signs and symptoms of complications. Dressing and pressure applied. Cardiac monitoring discontinued. Pt denies pain at this time. No complaints noted.  An After Visit Summary was printed and given to the patient. Patient escorted via [place], and D/C home via private auto with sister.  [place] 10/11/2019 12:05 PM

## 2019-10-11 NOTE — Progress Notes (Signed)
VASCULAR SURGERY:  I saw him last night.  He stated that his symptoms in the left arm are improved and his left hand feels better after banding of his fistula.  He still has a good thrill in his fistula.  He has a follow-up appointment with me in the office.  [name], MD Office: [phone]

## 2019-10-12 ENCOUNTER — Telehealth: Payer: Self-pay | Admitting: Physician Assistant

## 2019-10-12 DIAGNOSIS — N2581 Secondary hyperparathyroidism of renal origin: Secondary | ICD-10-CM | POA: Diagnosis not present

## 2019-10-12 DIAGNOSIS — Z992 Dependence on renal dialysis: Secondary | ICD-10-CM | POA: Diagnosis not present

## 2019-10-12 DIAGNOSIS — N186 End stage renal disease: Secondary | ICD-10-CM | POA: Diagnosis not present

## 2019-10-12 NOTE — Telephone Encounter (Signed)
Transition of care contact from inpatient facility  Date of Discharge: 10/10/19      Date of Contact: 10/12/19 - attempted Method of contact: Phone  Attempted to contact patient to discuss transition of care from inpatient admission. Patient did not answer the phone. Unable to leave voicemail.   Anice Paganini, PA-C 10/12/2019, 12:30 PM  [facility] Pager: [phone]

## 2019-10-13 ENCOUNTER — Telehealth: Payer: Self-pay | Admitting: *Deleted

## 2019-10-13 DIAGNOSIS — T82898D Other specified complication of vascular prosthetic devices, implants and grafts, subsequent encounter: Secondary | ICD-10-CM | POA: Diagnosis not present

## 2019-10-13 DIAGNOSIS — I480 Paroxysmal atrial fibrillation: Secondary | ICD-10-CM | POA: Diagnosis not present

## 2019-10-13 DIAGNOSIS — I132 Hypertensive heart and chronic kidney disease with heart failure and with stage 5 chronic kidney disease, or end stage renal disease: Secondary | ICD-10-CM | POA: Diagnosis not present

## 2019-10-13 DIAGNOSIS — I2510 Atherosclerotic heart disease of native coronary artery without angina pectoris: Secondary | ICD-10-CM | POA: Diagnosis not present

## 2019-10-13 DIAGNOSIS — E1122 Type 2 diabetes mellitus with diabetic chronic kidney disease: Secondary | ICD-10-CM | POA: Diagnosis not present

## 2019-10-13 DIAGNOSIS — N186 End stage renal disease: Secondary | ICD-10-CM | POA: Diagnosis not present

## 2019-10-13 DIAGNOSIS — T82838D Hemorrhage of vascular prosthetic devices, implants and grafts, subsequent encounter: Secondary | ICD-10-CM | POA: Diagnosis not present

## 2019-10-13 DIAGNOSIS — I5030 Unspecified diastolic (congestive) heart failure: Secondary | ICD-10-CM | POA: Diagnosis not present

## 2019-10-13 DIAGNOSIS — D631 Anemia in chronic kidney disease: Secondary | ICD-10-CM | POA: Diagnosis not present

## 2019-10-13 NOTE — Telephone Encounter (Signed)
Received call from [facility], [facility] that patient was c/o cramping and numbness of hand, s/p AVF.  I called patient and spoke with daughter.  Patient had not discussed this with her and he was out.  I gave daughter instructions of elevation and opening and closing hand.  Also I instructed to call back if symptoms worsen.

## 2019-10-14 DIAGNOSIS — I509 Heart failure, unspecified: Secondary | ICD-10-CM | POA: Diagnosis not present

## 2019-10-14 DIAGNOSIS — J9621 Acute and chronic respiratory failure with hypoxia: Secondary | ICD-10-CM | POA: Diagnosis not present

## 2019-10-14 DIAGNOSIS — I2510 Atherosclerotic heart disease of native coronary artery without angina pectoris: Secondary | ICD-10-CM | POA: Diagnosis not present

## 2019-10-14 DIAGNOSIS — Z992 Dependence on renal dialysis: Secondary | ICD-10-CM | POA: Diagnosis not present

## 2019-10-14 DIAGNOSIS — J449 Chronic obstructive pulmonary disease, unspecified: Secondary | ICD-10-CM | POA: Diagnosis not present

## 2019-10-14 DIAGNOSIS — N186 End stage renal disease: Secondary | ICD-10-CM | POA: Diagnosis not present

## 2019-10-14 DIAGNOSIS — N2581 Secondary hyperparathyroidism of renal origin: Secondary | ICD-10-CM | POA: Diagnosis not present

## 2019-10-14 DIAGNOSIS — J849 Interstitial pulmonary disease, unspecified: Secondary | ICD-10-CM | POA: Diagnosis not present

## 2019-10-17 DIAGNOSIS — N2581 Secondary hyperparathyroidism of renal origin: Secondary | ICD-10-CM | POA: Diagnosis not present

## 2019-10-17 DIAGNOSIS — Z992 Dependence on renal dialysis: Secondary | ICD-10-CM | POA: Diagnosis not present

## 2019-10-17 DIAGNOSIS — N186 End stage renal disease: Secondary | ICD-10-CM | POA: Diagnosis not present

## 2019-10-18 DIAGNOSIS — D631 Anemia in chronic kidney disease: Secondary | ICD-10-CM | POA: Diagnosis not present

## 2019-10-18 DIAGNOSIS — I480 Paroxysmal atrial fibrillation: Secondary | ICD-10-CM | POA: Diagnosis not present

## 2019-10-18 DIAGNOSIS — I5030 Unspecified diastolic (congestive) heart failure: Secondary | ICD-10-CM | POA: Diagnosis not present

## 2019-10-18 DIAGNOSIS — I2510 Atherosclerotic heart disease of native coronary artery without angina pectoris: Secondary | ICD-10-CM | POA: Diagnosis not present

## 2019-10-18 DIAGNOSIS — T82838D Hemorrhage of vascular prosthetic devices, implants and grafts, subsequent encounter: Secondary | ICD-10-CM | POA: Diagnosis not present

## 2019-10-18 DIAGNOSIS — N186 End stage renal disease: Secondary | ICD-10-CM | POA: Diagnosis not present

## 2019-10-18 DIAGNOSIS — E1122 Type 2 diabetes mellitus with diabetic chronic kidney disease: Secondary | ICD-10-CM | POA: Diagnosis not present

## 2019-10-18 DIAGNOSIS — T82898D Other specified complication of vascular prosthetic devices, implants and grafts, subsequent encounter: Secondary | ICD-10-CM | POA: Diagnosis not present

## 2019-10-18 DIAGNOSIS — I132 Hypertensive heart and chronic kidney disease with heart failure and with stage 5 chronic kidney disease, or end stage renal disease: Secondary | ICD-10-CM | POA: Diagnosis not present

## 2019-10-19 DIAGNOSIS — N2581 Secondary hyperparathyroidism of renal origin: Secondary | ICD-10-CM | POA: Diagnosis not present

## 2019-10-19 DIAGNOSIS — Z992 Dependence on renal dialysis: Secondary | ICD-10-CM | POA: Diagnosis not present

## 2019-10-19 DIAGNOSIS — N186 End stage renal disease: Secondary | ICD-10-CM | POA: Diagnosis not present

## 2019-10-20 DIAGNOSIS — I132 Hypertensive heart and chronic kidney disease with heart failure and with stage 5 chronic kidney disease, or end stage renal disease: Secondary | ICD-10-CM | POA: Diagnosis not present

## 2019-10-20 DIAGNOSIS — E1122 Type 2 diabetes mellitus with diabetic chronic kidney disease: Secondary | ICD-10-CM | POA: Diagnosis not present

## 2019-10-20 DIAGNOSIS — I480 Paroxysmal atrial fibrillation: Secondary | ICD-10-CM | POA: Diagnosis not present

## 2019-10-20 DIAGNOSIS — T82838D Hemorrhage of vascular prosthetic devices, implants and grafts, subsequent encounter: Secondary | ICD-10-CM | POA: Diagnosis not present

## 2019-10-20 DIAGNOSIS — I2510 Atherosclerotic heart disease of native coronary artery without angina pectoris: Secondary | ICD-10-CM | POA: Diagnosis not present

## 2019-10-20 DIAGNOSIS — N186 End stage renal disease: Secondary | ICD-10-CM | POA: Diagnosis not present

## 2019-10-20 DIAGNOSIS — D631 Anemia in chronic kidney disease: Secondary | ICD-10-CM | POA: Diagnosis not present

## 2019-10-20 DIAGNOSIS — T82898D Other specified complication of vascular prosthetic devices, implants and grafts, subsequent encounter: Secondary | ICD-10-CM | POA: Diagnosis not present

## 2019-10-20 DIAGNOSIS — I5030 Unspecified diastolic (congestive) heart failure: Secondary | ICD-10-CM | POA: Diagnosis not present

## 2019-10-21 DIAGNOSIS — Z992 Dependence on renal dialysis: Secondary | ICD-10-CM | POA: Diagnosis not present

## 2019-10-21 DIAGNOSIS — N186 End stage renal disease: Secondary | ICD-10-CM | POA: Diagnosis not present

## 2019-10-21 DIAGNOSIS — N2581 Secondary hyperparathyroidism of renal origin: Secondary | ICD-10-CM | POA: Diagnosis not present

## 2019-10-23 ENCOUNTER — Other Ambulatory Visit: Payer: Self-pay | Admitting: Family Medicine

## 2019-10-23 DIAGNOSIS — I132 Hypertensive heart and chronic kidney disease with heart failure and with stage 5 chronic kidney disease, or end stage renal disease: Secondary | ICD-10-CM | POA: Diagnosis not present

## 2019-10-23 DIAGNOSIS — T82898D Other specified complication of vascular prosthetic devices, implants and grafts, subsequent encounter: Secondary | ICD-10-CM | POA: Diagnosis not present

## 2019-10-23 DIAGNOSIS — T82838D Hemorrhage of vascular prosthetic devices, implants and grafts, subsequent encounter: Secondary | ICD-10-CM | POA: Diagnosis not present

## 2019-10-23 DIAGNOSIS — E1122 Type 2 diabetes mellitus with diabetic chronic kidney disease: Secondary | ICD-10-CM | POA: Diagnosis not present

## 2019-10-23 DIAGNOSIS — I2510 Atherosclerotic heart disease of native coronary artery without angina pectoris: Secondary | ICD-10-CM | POA: Diagnosis not present

## 2019-10-23 DIAGNOSIS — D631 Anemia in chronic kidney disease: Secondary | ICD-10-CM | POA: Diagnosis not present

## 2019-10-23 DIAGNOSIS — I5030 Unspecified diastolic (congestive) heart failure: Secondary | ICD-10-CM | POA: Diagnosis not present

## 2019-10-23 DIAGNOSIS — N186 End stage renal disease: Secondary | ICD-10-CM | POA: Diagnosis not present

## 2019-10-23 DIAGNOSIS — I480 Paroxysmal atrial fibrillation: Secondary | ICD-10-CM | POA: Diagnosis not present

## 2019-10-24 DIAGNOSIS — N186 End stage renal disease: Secondary | ICD-10-CM | POA: Diagnosis not present

## 2019-10-24 DIAGNOSIS — N2581 Secondary hyperparathyroidism of renal origin: Secondary | ICD-10-CM | POA: Diagnosis not present

## 2019-10-24 DIAGNOSIS — Z992 Dependence on renal dialysis: Secondary | ICD-10-CM | POA: Diagnosis not present

## 2019-10-25 DIAGNOSIS — T82838D Hemorrhage of vascular prosthetic devices, implants and grafts, subsequent encounter: Secondary | ICD-10-CM | POA: Diagnosis not present

## 2019-10-25 DIAGNOSIS — D631 Anemia in chronic kidney disease: Secondary | ICD-10-CM | POA: Diagnosis not present

## 2019-10-25 DIAGNOSIS — I5030 Unspecified diastolic (congestive) heart failure: Secondary | ICD-10-CM | POA: Diagnosis not present

## 2019-10-25 DIAGNOSIS — I132 Hypertensive heart and chronic kidney disease with heart failure and with stage 5 chronic kidney disease, or end stage renal disease: Secondary | ICD-10-CM | POA: Diagnosis not present

## 2019-10-25 DIAGNOSIS — E1122 Type 2 diabetes mellitus with diabetic chronic kidney disease: Secondary | ICD-10-CM | POA: Diagnosis not present

## 2019-10-25 DIAGNOSIS — I2510 Atherosclerotic heart disease of native coronary artery without angina pectoris: Secondary | ICD-10-CM | POA: Diagnosis not present

## 2019-10-25 DIAGNOSIS — I480 Paroxysmal atrial fibrillation: Secondary | ICD-10-CM | POA: Diagnosis not present

## 2019-10-25 DIAGNOSIS — T82898D Other specified complication of vascular prosthetic devices, implants and grafts, subsequent encounter: Secondary | ICD-10-CM | POA: Diagnosis not present

## 2019-10-25 DIAGNOSIS — N186 End stage renal disease: Secondary | ICD-10-CM | POA: Diagnosis not present

## 2019-10-26 DIAGNOSIS — N186 End stage renal disease: Secondary | ICD-10-CM | POA: Diagnosis not present

## 2019-10-26 DIAGNOSIS — Z992 Dependence on renal dialysis: Secondary | ICD-10-CM | POA: Diagnosis not present

## 2019-10-26 DIAGNOSIS — N2581 Secondary hyperparathyroidism of renal origin: Secondary | ICD-10-CM | POA: Diagnosis not present

## 2019-10-27 ENCOUNTER — Encounter: Payer: Self-pay | Admitting: Family Medicine

## 2019-10-27 ENCOUNTER — Ambulatory Visit (INDEPENDENT_AMBULATORY_CARE_PROVIDER_SITE_OTHER): Payer: Medicare HMO | Admitting: Family Medicine

## 2019-10-27 ENCOUNTER — Other Ambulatory Visit: Payer: Self-pay

## 2019-10-27 VITALS — BP 130/62 | HR 86 | Temp 96.6°F | Resp 16 | Ht 70.0 in | Wt 146.0 lb

## 2019-10-27 DIAGNOSIS — K4090 Unilateral inguinal hernia, without obstruction or gangrene, not specified as recurrent: Secondary | ICD-10-CM | POA: Diagnosis not present

## 2019-10-27 DIAGNOSIS — Z992 Dependence on renal dialysis: Secondary | ICD-10-CM | POA: Diagnosis not present

## 2019-10-27 DIAGNOSIS — R1031 Right lower quadrant pain: Secondary | ICD-10-CM

## 2019-10-27 DIAGNOSIS — N186 End stage renal disease: Secondary | ICD-10-CM | POA: Diagnosis not present

## 2019-10-27 DIAGNOSIS — E1122 Type 2 diabetes mellitus with diabetic chronic kidney disease: Secondary | ICD-10-CM | POA: Diagnosis not present

## 2019-10-27 MED ORDER — FENTANYL 50 MCG/HR TD PT72: 1.0000 | MEDICATED_PATCH | TRANSDERMAL | 0 refills | Status: AC
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
## 2019-10-28 DIAGNOSIS — N2581 Secondary hyperparathyroidism of renal origin: Secondary | ICD-10-CM | POA: Diagnosis not present

## 2019-10-28 DIAGNOSIS — N186 End stage renal disease: Secondary | ICD-10-CM | POA: Diagnosis not present

## 2019-10-28 DIAGNOSIS — Z992 Dependence on renal dialysis: Secondary | ICD-10-CM | POA: Diagnosis not present

## 2019-10-31 DIAGNOSIS — Z992 Dependence on renal dialysis: Secondary | ICD-10-CM | POA: Diagnosis not present

## 2019-10-31 DIAGNOSIS — N186 End stage renal disease: Secondary | ICD-10-CM | POA: Diagnosis not present

## 2019-10-31 DIAGNOSIS — N2581 Secondary hyperparathyroidism of renal origin: Secondary | ICD-10-CM | POA: Diagnosis not present

## 2019-11-02 DIAGNOSIS — N2581 Secondary hyperparathyroidism of renal origin: Secondary | ICD-10-CM | POA: Diagnosis not present

## 2019-11-02 DIAGNOSIS — N186 End stage renal disease: Secondary | ICD-10-CM | POA: Diagnosis not present

## 2019-11-02 DIAGNOSIS — Z992 Dependence on renal dialysis: Secondary | ICD-10-CM | POA: Diagnosis not present

## 2019-11-03 ENCOUNTER — Other Ambulatory Visit: Payer: Self-pay | Admitting: Family Medicine

## 2019-11-03 DIAGNOSIS — I5030 Unspecified diastolic (congestive) heart failure: Secondary | ICD-10-CM | POA: Diagnosis not present

## 2019-11-03 DIAGNOSIS — N186 End stage renal disease: Secondary | ICD-10-CM | POA: Diagnosis not present

## 2019-11-03 DIAGNOSIS — E1122 Type 2 diabetes mellitus with diabetic chronic kidney disease: Secondary | ICD-10-CM | POA: Diagnosis not present

## 2019-11-03 DIAGNOSIS — T82838D Hemorrhage of vascular prosthetic devices, implants and grafts, subsequent encounter: Secondary | ICD-10-CM | POA: Diagnosis not present

## 2019-11-03 DIAGNOSIS — I480 Paroxysmal atrial fibrillation: Secondary | ICD-10-CM | POA: Diagnosis not present

## 2019-11-03 DIAGNOSIS — D631 Anemia in chronic kidney disease: Secondary | ICD-10-CM | POA: Diagnosis not present

## 2019-11-03 DIAGNOSIS — I2510 Atherosclerotic heart disease of native coronary artery without angina pectoris: Secondary | ICD-10-CM | POA: Diagnosis not present

## 2019-11-03 DIAGNOSIS — I132 Hypertensive heart and chronic kidney disease with heart failure and with stage 5 chronic kidney disease, or end stage renal disease: Secondary | ICD-10-CM | POA: Diagnosis not present

## 2019-11-03 DIAGNOSIS — T82898D Other specified complication of vascular prosthetic devices, implants and grafts, subsequent encounter: Secondary | ICD-10-CM | POA: Diagnosis not present

## 2019-11-03 MED ORDER — OXYCODONE-ACETAMINOPHEN 10-325 MG PO TABS: 1.0000 | ORAL_TABLET | ORAL | 0 refills | Status: DC | PRN

## 2019-11-03 NOTE — Telephone Encounter (Signed)
Patient requesting a refill on Oxycodone   (daughter states that he has not used the patches as he is scared to use them)   LOV: 10/27/2019  LRF:   09/22/2019

## 2019-11-04 DIAGNOSIS — N2581 Secondary hyperparathyroidism of renal origin: Secondary | ICD-10-CM | POA: Diagnosis not present

## 2019-11-04 DIAGNOSIS — N186 End stage renal disease: Secondary | ICD-10-CM | POA: Diagnosis not present

## 2019-11-04 DIAGNOSIS — Z992 Dependence on renal dialysis: Secondary | ICD-10-CM | POA: Diagnosis not present

## 2019-11-07 DIAGNOSIS — J849 Interstitial pulmonary disease, unspecified: Secondary | ICD-10-CM | POA: Diagnosis not present

## 2019-11-07 DIAGNOSIS — J449 Chronic obstructive pulmonary disease, unspecified: Secondary | ICD-10-CM | POA: Diagnosis not present

## 2019-11-07 DIAGNOSIS — I509 Heart failure, unspecified: Secondary | ICD-10-CM | POA: Diagnosis not present

## 2019-11-07 DIAGNOSIS — J9621 Acute and chronic respiratory failure with hypoxia: Secondary | ICD-10-CM | POA: Diagnosis not present

## 2019-11-07 DIAGNOSIS — I2510 Atherosclerotic heart disease of native coronary artery without angina pectoris: Secondary | ICD-10-CM | POA: Diagnosis not present

## 2019-11-08 DIAGNOSIS — N186 End stage renal disease: Secondary | ICD-10-CM | POA: Diagnosis not present

## 2019-11-08 DIAGNOSIS — T82898D Other specified complication of vascular prosthetic devices, implants and grafts, subsequent encounter: Secondary | ICD-10-CM | POA: Diagnosis not present

## 2019-11-08 DIAGNOSIS — I2510 Atherosclerotic heart disease of native coronary artery without angina pectoris: Secondary | ICD-10-CM | POA: Diagnosis not present

## 2019-11-08 DIAGNOSIS — E1122 Type 2 diabetes mellitus with diabetic chronic kidney disease: Secondary | ICD-10-CM | POA: Diagnosis not present

## 2019-11-08 DIAGNOSIS — I132 Hypertensive heart and chronic kidney disease with heart failure and with stage 5 chronic kidney disease, or end stage renal disease: Secondary | ICD-10-CM | POA: Diagnosis not present

## 2019-11-08 DIAGNOSIS — T82838D Hemorrhage of vascular prosthetic devices, implants and grafts, subsequent encounter: Secondary | ICD-10-CM | POA: Diagnosis not present

## 2019-11-08 DIAGNOSIS — D631 Anemia in chronic kidney disease: Secondary | ICD-10-CM | POA: Diagnosis not present

## 2019-11-08 DIAGNOSIS — I480 Paroxysmal atrial fibrillation: Secondary | ICD-10-CM | POA: Diagnosis not present

## 2019-11-08 DIAGNOSIS — I5030 Unspecified diastolic (congestive) heart failure: Secondary | ICD-10-CM | POA: Diagnosis not present

## 2019-11-09 DIAGNOSIS — L89152 Pressure ulcer of sacral region, stage 2: Secondary | ICD-10-CM | POA: Diagnosis not present

## 2019-11-09 DIAGNOSIS — Z992 Dependence on renal dialysis: Secondary | ICD-10-CM | POA: Diagnosis not present

## 2019-11-09 DIAGNOSIS — I2510 Atherosclerotic heart disease of native coronary artery without angina pectoris: Secondary | ICD-10-CM | POA: Diagnosis not present

## 2019-11-09 DIAGNOSIS — M1990 Unspecified osteoarthritis, unspecified site: Secondary | ICD-10-CM | POA: Diagnosis not present

## 2019-11-09 DIAGNOSIS — I5030 Unspecified diastolic (congestive) heart failure: Secondary | ICD-10-CM | POA: Diagnosis not present

## 2019-11-09 DIAGNOSIS — J449 Chronic obstructive pulmonary disease, unspecified: Secondary | ICD-10-CM | POA: Diagnosis not present

## 2019-11-09 DIAGNOSIS — N2581 Secondary hyperparathyroidism of renal origin: Secondary | ICD-10-CM | POA: Diagnosis not present

## 2019-11-09 DIAGNOSIS — T82898D Other specified complication of vascular prosthetic devices, implants and grafts, subsequent encounter: Secondary | ICD-10-CM | POA: Diagnosis not present

## 2019-11-09 DIAGNOSIS — I739 Peripheral vascular disease, unspecified: Secondary | ICD-10-CM | POA: Diagnosis not present

## 2019-11-09 DIAGNOSIS — T82838D Hemorrhage of vascular prosthetic devices, implants and grafts, subsequent encounter: Secondary | ICD-10-CM | POA: Diagnosis not present

## 2019-11-09 DIAGNOSIS — I132 Hypertensive heart and chronic kidney disease with heart failure and with stage 5 chronic kidney disease, or end stage renal disease: Secondary | ICD-10-CM | POA: Diagnosis not present

## 2019-11-09 DIAGNOSIS — N186 End stage renal disease: Secondary | ICD-10-CM | POA: Diagnosis not present

## 2019-11-09 DIAGNOSIS — D631 Anemia in chronic kidney disease: Secondary | ICD-10-CM | POA: Diagnosis not present

## 2019-11-09 DIAGNOSIS — E1122 Type 2 diabetes mellitus with diabetic chronic kidney disease: Secondary | ICD-10-CM | POA: Diagnosis not present

## 2019-11-11 DIAGNOSIS — N2581 Secondary hyperparathyroidism of renal origin: Secondary | ICD-10-CM | POA: Diagnosis not present

## 2019-11-11 DIAGNOSIS — N186 End stage renal disease: Secondary | ICD-10-CM | POA: Diagnosis not present

## 2019-11-11 DIAGNOSIS — Z992 Dependence on renal dialysis: Secondary | ICD-10-CM | POA: Diagnosis not present

## 2019-11-13 DIAGNOSIS — J449 Chronic obstructive pulmonary disease, unspecified: Secondary | ICD-10-CM | POA: Diagnosis not present

## 2019-11-13 DIAGNOSIS — I509 Heart failure, unspecified: Secondary | ICD-10-CM | POA: Diagnosis not present

## 2019-11-13 DIAGNOSIS — J849 Interstitial pulmonary disease, unspecified: Secondary | ICD-10-CM | POA: Diagnosis not present

## 2019-11-13 DIAGNOSIS — J9621 Acute and chronic respiratory failure with hypoxia: Secondary | ICD-10-CM | POA: Diagnosis not present

## 2019-11-13 DIAGNOSIS — I2510 Atherosclerotic heart disease of native coronary artery without angina pectoris: Secondary | ICD-10-CM | POA: Diagnosis not present

## 2019-11-14 DIAGNOSIS — Z992 Dependence on renal dialysis: Secondary | ICD-10-CM | POA: Diagnosis not present

## 2019-11-14 DIAGNOSIS — N2581 Secondary hyperparathyroidism of renal origin: Secondary | ICD-10-CM | POA: Diagnosis not present

## 2019-11-14 DIAGNOSIS — N186 End stage renal disease: Secondary | ICD-10-CM | POA: Diagnosis not present

## 2019-11-15 DIAGNOSIS — E1122 Type 2 diabetes mellitus with diabetic chronic kidney disease: Secondary | ICD-10-CM | POA: Diagnosis not present

## 2019-11-15 DIAGNOSIS — T82838D Hemorrhage of vascular prosthetic devices, implants and grafts, subsequent encounter: Secondary | ICD-10-CM | POA: Diagnosis not present

## 2019-11-15 DIAGNOSIS — I132 Hypertensive heart and chronic kidney disease with heart failure and with stage 5 chronic kidney disease, or end stage renal disease: Secondary | ICD-10-CM | POA: Diagnosis not present

## 2019-11-15 DIAGNOSIS — D631 Anemia in chronic kidney disease: Secondary | ICD-10-CM | POA: Diagnosis not present

## 2019-11-15 DIAGNOSIS — N186 End stage renal disease: Secondary | ICD-10-CM | POA: Diagnosis not present

## 2019-11-15 DIAGNOSIS — I2510 Atherosclerotic heart disease of native coronary artery without angina pectoris: Secondary | ICD-10-CM | POA: Diagnosis not present

## 2019-11-15 DIAGNOSIS — L89152 Pressure ulcer of sacral region, stage 2: Secondary | ICD-10-CM | POA: Diagnosis not present

## 2019-11-15 DIAGNOSIS — I5030 Unspecified diastolic (congestive) heart failure: Secondary | ICD-10-CM | POA: Diagnosis not present

## 2019-11-15 DIAGNOSIS — T82898D Other specified complication of vascular prosthetic devices, implants and grafts, subsequent encounter: Secondary | ICD-10-CM | POA: Diagnosis not present

## 2019-11-16 DIAGNOSIS — N186 End stage renal disease: Secondary | ICD-10-CM | POA: Diagnosis not present

## 2019-11-16 DIAGNOSIS — Z992 Dependence on renal dialysis: Secondary | ICD-10-CM | POA: Diagnosis not present

## 2019-11-16 DIAGNOSIS — N2581 Secondary hyperparathyroidism of renal origin: Secondary | ICD-10-CM | POA: Diagnosis not present

## 2019-11-18 DIAGNOSIS — Z992 Dependence on renal dialysis: Secondary | ICD-10-CM | POA: Diagnosis not present

## 2019-11-18 DIAGNOSIS — N2581 Secondary hyperparathyroidism of renal origin: Secondary | ICD-10-CM | POA: Diagnosis not present

## 2019-11-18 DIAGNOSIS — N186 End stage renal disease: Secondary | ICD-10-CM | POA: Diagnosis not present

## 2019-11-21 DIAGNOSIS — N2581 Secondary hyperparathyroidism of renal origin: Secondary | ICD-10-CM | POA: Diagnosis not present

## 2019-11-21 DIAGNOSIS — N186 End stage renal disease: Secondary | ICD-10-CM | POA: Diagnosis not present

## 2019-11-21 DIAGNOSIS — Z992 Dependence on renal dialysis: Secondary | ICD-10-CM | POA: Diagnosis not present

## 2019-11-22 DIAGNOSIS — I132 Hypertensive heart and chronic kidney disease with heart failure and with stage 5 chronic kidney disease, or end stage renal disease: Secondary | ICD-10-CM | POA: Diagnosis not present

## 2019-11-22 DIAGNOSIS — I5030 Unspecified diastolic (congestive) heart failure: Secondary | ICD-10-CM | POA: Diagnosis not present

## 2019-11-22 DIAGNOSIS — T82898D Other specified complication of vascular prosthetic devices, implants and grafts, subsequent encounter: Secondary | ICD-10-CM | POA: Diagnosis not present

## 2019-11-22 DIAGNOSIS — D631 Anemia in chronic kidney disease: Secondary | ICD-10-CM | POA: Diagnosis not present

## 2019-11-22 DIAGNOSIS — E1122 Type 2 diabetes mellitus with diabetic chronic kidney disease: Secondary | ICD-10-CM | POA: Diagnosis not present

## 2019-11-22 DIAGNOSIS — T82838D Hemorrhage of vascular prosthetic devices, implants and grafts, subsequent encounter: Secondary | ICD-10-CM | POA: Diagnosis not present

## 2019-11-22 DIAGNOSIS — N186 End stage renal disease: Secondary | ICD-10-CM | POA: Diagnosis not present

## 2019-11-22 DIAGNOSIS — I2510 Atherosclerotic heart disease of native coronary artery without angina pectoris: Secondary | ICD-10-CM | POA: Diagnosis not present

## 2019-11-22 DIAGNOSIS — L89152 Pressure ulcer of sacral region, stage 2: Secondary | ICD-10-CM | POA: Diagnosis not present

## 2019-11-23 DIAGNOSIS — Z992 Dependence on renal dialysis: Secondary | ICD-10-CM | POA: Diagnosis not present

## 2019-11-23 DIAGNOSIS — N186 End stage renal disease: Secondary | ICD-10-CM | POA: Diagnosis not present

## 2019-11-23 DIAGNOSIS — N2581 Secondary hyperparathyroidism of renal origin: Secondary | ICD-10-CM | POA: Diagnosis not present

## 2019-11-25 DIAGNOSIS — N186 End stage renal disease: Secondary | ICD-10-CM | POA: Diagnosis not present

## 2019-11-25 DIAGNOSIS — N2581 Secondary hyperparathyroidism of renal origin: Secondary | ICD-10-CM | POA: Diagnosis not present

## 2019-11-25 DIAGNOSIS — Z992 Dependence on renal dialysis: Secondary | ICD-10-CM | POA: Diagnosis not present

## 2019-11-27 DIAGNOSIS — E1122 Type 2 diabetes mellitus with diabetic chronic kidney disease: Secondary | ICD-10-CM | POA: Diagnosis not present

## 2019-11-27 DIAGNOSIS — N186 End stage renal disease: Secondary | ICD-10-CM | POA: Diagnosis not present

## 2019-11-27 DIAGNOSIS — Z992 Dependence on renal dialysis: Secondary | ICD-10-CM | POA: Diagnosis not present

## 2019-11-28 DIAGNOSIS — Z992 Dependence on renal dialysis: Secondary | ICD-10-CM | POA: Diagnosis not present

## 2019-11-28 DIAGNOSIS — N2581 Secondary hyperparathyroidism of renal origin: Secondary | ICD-10-CM | POA: Diagnosis not present

## 2019-11-28 DIAGNOSIS — N186 End stage renal disease: Secondary | ICD-10-CM | POA: Diagnosis not present

## 2019-11-29 DIAGNOSIS — I132 Hypertensive heart and chronic kidney disease with heart failure and with stage 5 chronic kidney disease, or end stage renal disease: Secondary | ICD-10-CM | POA: Diagnosis not present

## 2019-11-29 DIAGNOSIS — T82838D Hemorrhage of vascular prosthetic devices, implants and grafts, subsequent encounter: Secondary | ICD-10-CM | POA: Diagnosis not present

## 2019-11-29 DIAGNOSIS — I5030 Unspecified diastolic (congestive) heart failure: Secondary | ICD-10-CM | POA: Diagnosis not present

## 2019-11-29 DIAGNOSIS — N186 End stage renal disease: Secondary | ICD-10-CM | POA: Diagnosis not present

## 2019-11-29 DIAGNOSIS — T82898D Other specified complication of vascular prosthetic devices, implants and grafts, subsequent encounter: Secondary | ICD-10-CM | POA: Diagnosis not present

## 2019-11-29 DIAGNOSIS — L89152 Pressure ulcer of sacral region, stage 2: Secondary | ICD-10-CM | POA: Diagnosis not present

## 2019-11-29 DIAGNOSIS — D631 Anemia in chronic kidney disease: Secondary | ICD-10-CM | POA: Diagnosis not present

## 2019-11-29 DIAGNOSIS — I2510 Atherosclerotic heart disease of native coronary artery without angina pectoris: Secondary | ICD-10-CM | POA: Diagnosis not present

## 2019-11-29 DIAGNOSIS — E1122 Type 2 diabetes mellitus with diabetic chronic kidney disease: Secondary | ICD-10-CM | POA: Diagnosis not present

## 2019-11-30 ENCOUNTER — Emergency Department (HOSPITAL_COMMUNITY): Payer: Medicare HMO

## 2019-11-30 ENCOUNTER — Emergency Department (HOSPITAL_COMMUNITY)
Admission: EM | Admit: 2019-11-30 | Discharge: 2019-11-30 | Disposition: A | Discharge status: Home / Self Care | Payer: Medicare HMO | Attending: Emergency Medicine | Admitting: Emergency Medicine

## 2019-11-30 DIAGNOSIS — N186 End stage renal disease: Secondary | ICD-10-CM | POA: Diagnosis not present

## 2019-11-30 DIAGNOSIS — Z992 Dependence on renal dialysis: Secondary | ICD-10-CM | POA: Insufficient documentation

## 2019-11-30 DIAGNOSIS — Z79899 Other long term (current) drug therapy: Secondary | ICD-10-CM | POA: Diagnosis not present

## 2019-11-30 DIAGNOSIS — Z794 Long term (current) use of insulin: Secondary | ICD-10-CM | POA: Diagnosis not present

## 2019-11-30 DIAGNOSIS — I2510 Atherosclerotic heart disease of native coronary artery without angina pectoris: Secondary | ICD-10-CM | POA: Insufficient documentation

## 2019-11-30 DIAGNOSIS — I5032 Chronic diastolic (congestive) heart failure: Secondary | ICD-10-CM | POA: Insufficient documentation

## 2019-11-30 DIAGNOSIS — T148XXA Other injury of unspecified body region, initial encounter: Secondary | ICD-10-CM

## 2019-11-30 DIAGNOSIS — I132 Hypertensive heart and chronic kidney disease with heart failure and with stage 5 chronic kidney disease, or end stage renal disease: Secondary | ICD-10-CM | POA: Diagnosis not present

## 2019-11-30 DIAGNOSIS — Y999 Unspecified external cause status: Secondary | ICD-10-CM | POA: Diagnosis not present

## 2019-11-30 DIAGNOSIS — S40022A Contusion of left upper arm, initial encounter: Secondary | ICD-10-CM | POA: Insufficient documentation

## 2019-11-30 DIAGNOSIS — Y9389 Activity, other specified: Secondary | ICD-10-CM | POA: Diagnosis not present

## 2019-11-30 DIAGNOSIS — E1122 Type 2 diabetes mellitus with diabetic chronic kidney disease: Secondary | ICD-10-CM | POA: Diagnosis not present

## 2019-11-30 DIAGNOSIS — R079 Chest pain, unspecified: Secondary | ICD-10-CM | POA: Diagnosis not present

## 2019-11-30 DIAGNOSIS — Y9289 Other specified places as the place of occurrence of the external cause: Secondary | ICD-10-CM | POA: Insufficient documentation

## 2019-11-30 DIAGNOSIS — Z8551 Personal history of malignant neoplasm of bladder: Secondary | ICD-10-CM | POA: Diagnosis not present

## 2019-11-30 DIAGNOSIS — I10 Essential (primary) hypertension: Secondary | ICD-10-CM | POA: Diagnosis not present

## 2019-11-30 DIAGNOSIS — S20212A Contusion of left front wall of thorax, initial encounter: Secondary | ICD-10-CM | POA: Diagnosis not present

## 2019-11-30 DIAGNOSIS — J449 Chronic obstructive pulmonary disease, unspecified: Secondary | ICD-10-CM | POA: Insufficient documentation

## 2019-11-30 DIAGNOSIS — R0602 Shortness of breath: Secondary | ICD-10-CM | POA: Diagnosis not present

## 2019-11-30 DIAGNOSIS — X58XXXA Exposure to other specified factors, initial encounter: Secondary | ICD-10-CM | POA: Diagnosis not present

## 2019-11-30 DIAGNOSIS — F17210 Nicotine dependence, cigarettes, uncomplicated: Secondary | ICD-10-CM | POA: Insufficient documentation

## 2019-11-30 DIAGNOSIS — Z7982 Long term (current) use of aspirin: Secondary | ICD-10-CM | POA: Diagnosis not present

## 2019-11-30 DIAGNOSIS — S4992XA Unspecified injury of left shoulder and upper arm, initial encounter: Secondary | ICD-10-CM | POA: Diagnosis present

## 2019-11-30 DIAGNOSIS — Z7902 Long term (current) use of antithrombotics/antiplatelets: Secondary | ICD-10-CM | POA: Diagnosis not present

## 2019-11-30 LAB — BASIC METABOLIC PANEL
Anion gap: 15 (ref 5–15)
BUN: 41 mg/dL — ABNORMAL HIGH (ref 8–23)
CO2: 28 mmol/L (ref 22–32)
Calcium: 8.1 mg/dL — ABNORMAL LOW (ref 8.9–10.3)
Chloride: 97 mmol/L — ABNORMAL LOW (ref 98–111)
Creatinine, Ser: 7.73 mg/dL — ABNORMAL HIGH (ref 0.61–1.24)
GFR calc Af Amer: 7 mL/min — ABNORMAL LOW (ref >60)
GFR calc non Af Amer: 6 mL/min — ABNORMAL LOW (ref >60)
Glucose, Bld: 105 mg/dL — ABNORMAL HIGH (ref 70–99)
Potassium: 4.1 mmol/L (ref 3.5–5.1)
Sodium: 140 mmol/L (ref 135–145)

## 2019-11-30 LAB — CBC
HCT: 32.2 % — ABNORMAL LOW (ref 39.0–52.0)
Hemoglobin: 9.8 g/dL — ABNORMAL LOW (ref 13.0–17.0)
MCH: 28.2 pg (ref 26.0–34.0)
MCHC: 30.4 g/dL (ref 30.0–36.0)
MCV: 92.8 fL (ref 80.0–100.0)
Platelets: 166 10*3/uL (ref 150–400)
RBC: 3.47 MIL/uL — ABNORMAL LOW (ref 4.22–5.81)
RDW: 20 % — ABNORMAL HIGH (ref 11.5–15.5)
WBC: 6.8 10*3/uL (ref 4.0–10.5)
nRBC: 0 % (ref 0.0–0.2)

## 2019-11-30 LAB — TROPONIN I (HIGH SENSITIVITY)
Troponin I (High Sensitivity): 63 ng/L — ABNORMAL HIGH (ref <18)
Troponin I (High Sensitivity): 62 ng/L — ABNORMAL HIGH (ref <18)

## 2019-11-30 MED ORDER — SODIUM CHLORIDE 0.9% FLUSH: 3.0000 mL | Freq: Once | INTRAVENOUS | Status: DC

## 2019-11-30 NOTE — Discharge Instructions (Addendum)
The bleeding that you had in your left arm and chest is likely from inadvertent puncture of the posterior wall of the fistula.  Your blood counts, blood pressure, chest x-ray are all normal today.  There is no indication that this is a blood clot that will cause problems. If you are feeling short of breath in the morning, call the dialysis center for an appointment to dialyze tomorrow.  Otherwise, go to your regular dialysis treatment on Saturday.
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
## 2019-11-30 NOTE — ED Triage Notes (Signed)
Pt arrives to ED w/ c/o chest pain sob. Pt is tues, thurs, sat dialysis pt and was at dialysis on Tuesday when his arm became painful at fistula site. Since then pt began to have edema and bruising to L arm and L chest, and then pt began to experience chest pain sob. Pt has extensive cardiac hx. Pt did not receive complete dialysis treatment on Tuesday, has not gone to dialysis today.

## 2019-12-02 DIAGNOSIS — Z992 Dependence on renal dialysis: Secondary | ICD-10-CM | POA: Diagnosis not present

## 2019-12-02 DIAGNOSIS — N186 End stage renal disease: Secondary | ICD-10-CM | POA: Diagnosis not present

## 2019-12-02 DIAGNOSIS — N2581 Secondary hyperparathyroidism of renal origin: Secondary | ICD-10-CM | POA: Diagnosis not present

## 2019-12-04 ENCOUNTER — Telehealth: Payer: Self-pay

## 2019-12-04 MED ORDER — OXYCODONE-ACETAMINOPHEN 10-325 MG PO TABS: 1.0000 | ORAL_TABLET | ORAL | 0 refills | Status: DC | PRN

## 2019-12-04 NOTE — Telephone Encounter (Signed)
Rx on oxycodone last refill 11-03-19

## 2019-12-05 DIAGNOSIS — N2581 Secondary hyperparathyroidism of renal origin: Secondary | ICD-10-CM | POA: Diagnosis not present

## 2019-12-05 DIAGNOSIS — N186 End stage renal disease: Secondary | ICD-10-CM | POA: Diagnosis not present

## 2019-12-05 DIAGNOSIS — Z992 Dependence on renal dialysis: Secondary | ICD-10-CM | POA: Diagnosis not present

## 2019-12-06 DIAGNOSIS — I5030 Unspecified diastolic (congestive) heart failure: Secondary | ICD-10-CM | POA: Diagnosis not present

## 2019-12-06 DIAGNOSIS — E1122 Type 2 diabetes mellitus with diabetic chronic kidney disease: Secondary | ICD-10-CM | POA: Diagnosis not present

## 2019-12-06 DIAGNOSIS — L89152 Pressure ulcer of sacral region, stage 2: Secondary | ICD-10-CM | POA: Diagnosis not present

## 2019-12-06 DIAGNOSIS — D631 Anemia in chronic kidney disease: Secondary | ICD-10-CM | POA: Diagnosis not present

## 2019-12-06 DIAGNOSIS — N186 End stage renal disease: Secondary | ICD-10-CM | POA: Diagnosis not present

## 2019-12-06 DIAGNOSIS — T82898D Other specified complication of vascular prosthetic devices, implants and grafts, subsequent encounter: Secondary | ICD-10-CM | POA: Diagnosis not present

## 2019-12-06 DIAGNOSIS — T82838D Hemorrhage of vascular prosthetic devices, implants and grafts, subsequent encounter: Secondary | ICD-10-CM | POA: Diagnosis not present

## 2019-12-06 DIAGNOSIS — I2510 Atherosclerotic heart disease of native coronary artery without angina pectoris: Secondary | ICD-10-CM | POA: Diagnosis not present

## 2019-12-06 DIAGNOSIS — I132 Hypertensive heart and chronic kidney disease with heart failure and with stage 5 chronic kidney disease, or end stage renal disease: Secondary | ICD-10-CM | POA: Diagnosis not present

## 2019-12-07 DIAGNOSIS — N2581 Secondary hyperparathyroidism of renal origin: Secondary | ICD-10-CM | POA: Diagnosis not present

## 2019-12-07 DIAGNOSIS — Z992 Dependence on renal dialysis: Secondary | ICD-10-CM | POA: Diagnosis not present

## 2019-12-07 DIAGNOSIS — N186 End stage renal disease: Secondary | ICD-10-CM | POA: Diagnosis not present

## 2019-12-08 DIAGNOSIS — J449 Chronic obstructive pulmonary disease, unspecified: Secondary | ICD-10-CM | POA: Diagnosis not present

## 2019-12-08 DIAGNOSIS — J849 Interstitial pulmonary disease, unspecified: Secondary | ICD-10-CM | POA: Diagnosis not present

## 2019-12-08 DIAGNOSIS — I2510 Atherosclerotic heart disease of native coronary artery without angina pectoris: Secondary | ICD-10-CM | POA: Diagnosis not present

## 2019-12-08 DIAGNOSIS — J9621 Acute and chronic respiratory failure with hypoxia: Secondary | ICD-10-CM | POA: Diagnosis not present

## 2019-12-08 DIAGNOSIS — I509 Heart failure, unspecified: Secondary | ICD-10-CM | POA: Diagnosis not present

## 2019-12-09 DIAGNOSIS — N2581 Secondary hyperparathyroidism of renal origin: Secondary | ICD-10-CM | POA: Diagnosis not present

## 2019-12-09 DIAGNOSIS — I5030 Unspecified diastolic (congestive) heart failure: Secondary | ICD-10-CM | POA: Diagnosis not present

## 2019-12-09 DIAGNOSIS — T82898D Other specified complication of vascular prosthetic devices, implants and grafts, subsequent encounter: Secondary | ICD-10-CM | POA: Diagnosis not present

## 2019-12-09 DIAGNOSIS — I2510 Atherosclerotic heart disease of native coronary artery without angina pectoris: Secondary | ICD-10-CM | POA: Diagnosis not present

## 2019-12-09 DIAGNOSIS — L89152 Pressure ulcer of sacral region, stage 2: Secondary | ICD-10-CM | POA: Diagnosis not present

## 2019-12-09 DIAGNOSIS — I132 Hypertensive heart and chronic kidney disease with heart failure and with stage 5 chronic kidney disease, or end stage renal disease: Secondary | ICD-10-CM | POA: Diagnosis not present

## 2019-12-09 DIAGNOSIS — N186 End stage renal disease: Secondary | ICD-10-CM | POA: Diagnosis not present

## 2019-12-09 DIAGNOSIS — T82838D Hemorrhage of vascular prosthetic devices, implants and grafts, subsequent encounter: Secondary | ICD-10-CM | POA: Diagnosis not present

## 2019-12-09 DIAGNOSIS — D631 Anemia in chronic kidney disease: Secondary | ICD-10-CM | POA: Diagnosis not present

## 2019-12-09 DIAGNOSIS — E1122 Type 2 diabetes mellitus with diabetic chronic kidney disease: Secondary | ICD-10-CM | POA: Diagnosis not present

## 2019-12-09 DIAGNOSIS — Z992 Dependence on renal dialysis: Secondary | ICD-10-CM | POA: Diagnosis not present

## 2019-12-10 DIAGNOSIS — J449 Chronic obstructive pulmonary disease, unspecified: Secondary | ICD-10-CM | POA: Diagnosis not present

## 2019-12-10 DIAGNOSIS — N186 End stage renal disease: Secondary | ICD-10-CM | POA: Diagnosis not present

## 2019-12-10 DIAGNOSIS — I739 Peripheral vascular disease, unspecified: Secondary | ICD-10-CM | POA: Diagnosis not present

## 2019-12-10 DIAGNOSIS — M1990 Unspecified osteoarthritis, unspecified site: Secondary | ICD-10-CM | POA: Diagnosis not present

## 2019-12-12 DIAGNOSIS — N2581 Secondary hyperparathyroidism of renal origin: Secondary | ICD-10-CM | POA: Diagnosis not present

## 2019-12-12 DIAGNOSIS — Z992 Dependence on renal dialysis: Secondary | ICD-10-CM | POA: Diagnosis not present

## 2019-12-12 DIAGNOSIS — N186 End stage renal disease: Secondary | ICD-10-CM | POA: Diagnosis not present

## 2019-12-13 DIAGNOSIS — T82838D Hemorrhage of vascular prosthetic devices, implants and grafts, subsequent encounter: Secondary | ICD-10-CM | POA: Diagnosis not present

## 2019-12-13 DIAGNOSIS — T82898D Other specified complication of vascular prosthetic devices, implants and grafts, subsequent encounter: Secondary | ICD-10-CM | POA: Diagnosis not present

## 2019-12-13 DIAGNOSIS — D631 Anemia in chronic kidney disease: Secondary | ICD-10-CM | POA: Diagnosis not present

## 2019-12-13 DIAGNOSIS — I132 Hypertensive heart and chronic kidney disease with heart failure and with stage 5 chronic kidney disease, or end stage renal disease: Secondary | ICD-10-CM | POA: Diagnosis not present

## 2019-12-13 DIAGNOSIS — N186 End stage renal disease: Secondary | ICD-10-CM | POA: Diagnosis not present

## 2019-12-13 DIAGNOSIS — I2510 Atherosclerotic heart disease of native coronary artery without angina pectoris: Secondary | ICD-10-CM | POA: Diagnosis not present

## 2019-12-13 DIAGNOSIS — I5030 Unspecified diastolic (congestive) heart failure: Secondary | ICD-10-CM | POA: Diagnosis not present

## 2019-12-13 DIAGNOSIS — L89152 Pressure ulcer of sacral region, stage 2: Secondary | ICD-10-CM | POA: Diagnosis not present

## 2019-12-13 DIAGNOSIS — E1122 Type 2 diabetes mellitus with diabetic chronic kidney disease: Secondary | ICD-10-CM | POA: Diagnosis not present

## 2019-12-14 DIAGNOSIS — J449 Chronic obstructive pulmonary disease, unspecified: Secondary | ICD-10-CM | POA: Diagnosis not present

## 2019-12-14 DIAGNOSIS — J9621 Acute and chronic respiratory failure with hypoxia: Secondary | ICD-10-CM | POA: Diagnosis not present

## 2019-12-14 DIAGNOSIS — I2510 Atherosclerotic heart disease of native coronary artery without angina pectoris: Secondary | ICD-10-CM | POA: Diagnosis not present

## 2019-12-14 DIAGNOSIS — N2581 Secondary hyperparathyroidism of renal origin: Secondary | ICD-10-CM | POA: Diagnosis not present

## 2019-12-14 DIAGNOSIS — J849 Interstitial pulmonary disease, unspecified: Secondary | ICD-10-CM | POA: Diagnosis not present

## 2019-12-14 DIAGNOSIS — Z992 Dependence on renal dialysis: Secondary | ICD-10-CM | POA: Diagnosis not present

## 2019-12-14 DIAGNOSIS — I509 Heart failure, unspecified: Secondary | ICD-10-CM | POA: Diagnosis not present

## 2019-12-14 DIAGNOSIS — N186 End stage renal disease: Secondary | ICD-10-CM | POA: Diagnosis not present

## 2019-12-15 ENCOUNTER — Other Ambulatory Visit: Payer: Self-pay

## 2019-12-15 ENCOUNTER — Ambulatory Visit (INDEPENDENT_AMBULATORY_CARE_PROVIDER_SITE_OTHER): Payer: Medicare HMO | Admitting: Podiatry

## 2019-12-15 ENCOUNTER — Encounter: Payer: Self-pay | Admitting: Podiatry

## 2019-12-15 DIAGNOSIS — M79674 Pain in right toe(s): Secondary | ICD-10-CM

## 2019-12-15 DIAGNOSIS — Z992 Dependence on renal dialysis: Secondary | ICD-10-CM | POA: Diagnosis not present

## 2019-12-15 DIAGNOSIS — B351 Tinea unguium: Secondary | ICD-10-CM | POA: Diagnosis not present

## 2019-12-15 DIAGNOSIS — Z794 Long term (current) use of insulin: Secondary | ICD-10-CM | POA: Diagnosis not present

## 2019-12-15 DIAGNOSIS — N186 End stage renal disease: Secondary | ICD-10-CM

## 2019-12-15 DIAGNOSIS — M79675 Pain in left toe(s): Secondary | ICD-10-CM

## 2019-12-15 DIAGNOSIS — E1121 Type 2 diabetes mellitus with diabetic nephropathy: Secondary | ICD-10-CM | POA: Diagnosis not present

## 2019-12-15 DIAGNOSIS — I739 Peripheral vascular disease, unspecified: Secondary | ICD-10-CM | POA: Diagnosis not present

## 2019-12-15 NOTE — Progress Notes (Signed)
This patient returns to my office for at risk foot care.  This patient requires this care by a professional since this patient will be at risk due to having PVD,  PAD and previous  DVT.  Patient also has diabetes with kidney disease.    This patient is unable to cut nails himself since the patient cannot reach his nails.These nails are painful walking and wearing shoes.  This patient presents for at risk foot care today.  HOH.  General Appearance  Alert, conversant and in no acute stress.  Vascular  Dorsalis pedis and posterior tibial  pulses are weakly  palpable  bilaterally.  Capillary return is within normal limits  bilaterally. Temperature is within normal limits  bilaterally.  Neurologic  Senn-Weinstein monofilament wire test diminished   bilaterally. Muscle power within normal limits bilaterally.  Nails Thick disfigured discolored nails with subungual debris  from hallux to fifth toes bilaterally. No evidence of bacterial infection or drainage bilaterally.  Orthopedic  No limitations of motion  feet .  No crepitus or effusions noted.  No bony pathology or digital deformities noted.  Skin  normotropic skin with no porokeratosis noted bilaterally.  No signs of infections or ulcers noted.     Onychomycosis  Pain in right toes  Pain in left toes  Consent was obtained for treatment procedures.   Mechanical debridement of nails 1-5  bilaterally performed with a nail nipper.  Filed with dremel without incident. No infection or ulcer.     Return office visit   3 months                  Told patient to return for periodic foot care and evaluation due to potential at risk complications.   Gardiner Barefoot DPM

## 2019-12-16 ENCOUNTER — Other Ambulatory Visit: Payer: Self-pay | Admitting: Family Medicine

## 2019-12-16 DIAGNOSIS — Z992 Dependence on renal dialysis: Secondary | ICD-10-CM | POA: Diagnosis not present

## 2019-12-16 DIAGNOSIS — N2581 Secondary hyperparathyroidism of renal origin: Secondary | ICD-10-CM | POA: Diagnosis not present

## 2019-12-16 DIAGNOSIS — N186 End stage renal disease: Secondary | ICD-10-CM | POA: Diagnosis not present

## 2019-12-19 DIAGNOSIS — N2581 Secondary hyperparathyroidism of renal origin: Secondary | ICD-10-CM | POA: Diagnosis not present

## 2019-12-19 DIAGNOSIS — N186 End stage renal disease: Secondary | ICD-10-CM | POA: Diagnosis not present

## 2019-12-19 DIAGNOSIS — Z992 Dependence on renal dialysis: Secondary | ICD-10-CM | POA: Diagnosis not present

## 2019-12-20 DIAGNOSIS — I5030 Unspecified diastolic (congestive) heart failure: Secondary | ICD-10-CM | POA: Diagnosis not present

## 2019-12-20 DIAGNOSIS — I132 Hypertensive heart and chronic kidney disease with heart failure and with stage 5 chronic kidney disease, or end stage renal disease: Secondary | ICD-10-CM | POA: Diagnosis not present

## 2019-12-20 DIAGNOSIS — N186 End stage renal disease: Secondary | ICD-10-CM | POA: Diagnosis not present

## 2019-12-20 DIAGNOSIS — L89152 Pressure ulcer of sacral region, stage 2: Secondary | ICD-10-CM | POA: Diagnosis not present

## 2019-12-20 DIAGNOSIS — T82898D Other specified complication of vascular prosthetic devices, implants and grafts, subsequent encounter: Secondary | ICD-10-CM | POA: Diagnosis not present

## 2019-12-20 DIAGNOSIS — D631 Anemia in chronic kidney disease: Secondary | ICD-10-CM | POA: Diagnosis not present

## 2019-12-20 DIAGNOSIS — T82838D Hemorrhage of vascular prosthetic devices, implants and grafts, subsequent encounter: Secondary | ICD-10-CM | POA: Diagnosis not present

## 2019-12-20 DIAGNOSIS — I2510 Atherosclerotic heart disease of native coronary artery without angina pectoris: Secondary | ICD-10-CM | POA: Diagnosis not present

## 2019-12-20 DIAGNOSIS — E1122 Type 2 diabetes mellitus with diabetic chronic kidney disease: Secondary | ICD-10-CM | POA: Diagnosis not present

## 2019-12-21 DIAGNOSIS — Z992 Dependence on renal dialysis: Secondary | ICD-10-CM | POA: Diagnosis not present

## 2019-12-21 DIAGNOSIS — N186 End stage renal disease: Secondary | ICD-10-CM | POA: Diagnosis not present

## 2019-12-21 DIAGNOSIS — N2581 Secondary hyperparathyroidism of renal origin: Secondary | ICD-10-CM | POA: Diagnosis not present

## 2019-12-23 DIAGNOSIS — N2581 Secondary hyperparathyroidism of renal origin: Secondary | ICD-10-CM | POA: Diagnosis not present

## 2019-12-23 DIAGNOSIS — N186 End stage renal disease: Secondary | ICD-10-CM | POA: Diagnosis not present

## 2019-12-23 DIAGNOSIS — Z992 Dependence on renal dialysis: Secondary | ICD-10-CM | POA: Diagnosis not present

## 2019-12-26 DIAGNOSIS — Z992 Dependence on renal dialysis: Secondary | ICD-10-CM | POA: Diagnosis not present

## 2019-12-26 DIAGNOSIS — N186 End stage renal disease: Secondary | ICD-10-CM | POA: Diagnosis not present

## 2019-12-26 DIAGNOSIS — N2581 Secondary hyperparathyroidism of renal origin: Secondary | ICD-10-CM | POA: Diagnosis not present

## 2019-12-27 DIAGNOSIS — T82838D Hemorrhage of vascular prosthetic devices, implants and grafts, subsequent encounter: Secondary | ICD-10-CM | POA: Diagnosis not present

## 2019-12-27 DIAGNOSIS — E1122 Type 2 diabetes mellitus with diabetic chronic kidney disease: Secondary | ICD-10-CM | POA: Diagnosis not present

## 2019-12-27 DIAGNOSIS — I132 Hypertensive heart and chronic kidney disease with heart failure and with stage 5 chronic kidney disease, or end stage renal disease: Secondary | ICD-10-CM | POA: Diagnosis not present

## 2019-12-27 DIAGNOSIS — Z992 Dependence on renal dialysis: Secondary | ICD-10-CM | POA: Diagnosis not present

## 2019-12-27 DIAGNOSIS — L89152 Pressure ulcer of sacral region, stage 2: Secondary | ICD-10-CM | POA: Diagnosis not present

## 2019-12-27 DIAGNOSIS — I2510 Atherosclerotic heart disease of native coronary artery without angina pectoris: Secondary | ICD-10-CM | POA: Diagnosis not present

## 2019-12-27 DIAGNOSIS — I5030 Unspecified diastolic (congestive) heart failure: Secondary | ICD-10-CM | POA: Diagnosis not present

## 2019-12-27 DIAGNOSIS — D631 Anemia in chronic kidney disease: Secondary | ICD-10-CM | POA: Diagnosis not present

## 2019-12-27 DIAGNOSIS — N186 End stage renal disease: Secondary | ICD-10-CM | POA: Diagnosis not present

## 2019-12-27 DIAGNOSIS — T82898D Other specified complication of vascular prosthetic devices, implants and grafts, subsequent encounter: Secondary | ICD-10-CM | POA: Diagnosis not present

## 2019-12-28 DIAGNOSIS — N2581 Secondary hyperparathyroidism of renal origin: Secondary | ICD-10-CM | POA: Diagnosis not present

## 2019-12-28 DIAGNOSIS — N186 End stage renal disease: Secondary | ICD-10-CM | POA: Diagnosis not present

## 2019-12-28 DIAGNOSIS — Z992 Dependence on renal dialysis: Secondary | ICD-10-CM | POA: Diagnosis not present

## 2019-12-30 DIAGNOSIS — N2581 Secondary hyperparathyroidism of renal origin: Secondary | ICD-10-CM | POA: Diagnosis not present

## 2019-12-30 DIAGNOSIS — Z992 Dependence on renal dialysis: Secondary | ICD-10-CM | POA: Diagnosis not present

## 2019-12-30 DIAGNOSIS — N186 End stage renal disease: Secondary | ICD-10-CM | POA: Diagnosis not present

## 2020-01-02 DIAGNOSIS — N2581 Secondary hyperparathyroidism of renal origin: Secondary | ICD-10-CM | POA: Diagnosis not present

## 2020-01-02 DIAGNOSIS — Z992 Dependence on renal dialysis: Secondary | ICD-10-CM | POA: Diagnosis not present

## 2020-01-02 DIAGNOSIS — N186 End stage renal disease: Secondary | ICD-10-CM | POA: Diagnosis not present

## 2020-01-03 ENCOUNTER — Other Ambulatory Visit: Payer: Self-pay

## 2020-01-03 DIAGNOSIS — I5030 Unspecified diastolic (congestive) heart failure: Secondary | ICD-10-CM | POA: Diagnosis not present

## 2020-01-03 DIAGNOSIS — I2510 Atherosclerotic heart disease of native coronary artery without angina pectoris: Secondary | ICD-10-CM | POA: Diagnosis not present

## 2020-01-03 DIAGNOSIS — T82898D Other specified complication of vascular prosthetic devices, implants and grafts, subsequent encounter: Secondary | ICD-10-CM | POA: Diagnosis not present

## 2020-01-03 DIAGNOSIS — L89152 Pressure ulcer of sacral region, stage 2: Secondary | ICD-10-CM | POA: Diagnosis not present

## 2020-01-03 DIAGNOSIS — T82838D Hemorrhage of vascular prosthetic devices, implants and grafts, subsequent encounter: Secondary | ICD-10-CM | POA: Diagnosis not present

## 2020-01-03 DIAGNOSIS — N186 End stage renal disease: Secondary | ICD-10-CM | POA: Diagnosis not present

## 2020-01-03 DIAGNOSIS — E1122 Type 2 diabetes mellitus with diabetic chronic kidney disease: Secondary | ICD-10-CM | POA: Diagnosis not present

## 2020-01-03 DIAGNOSIS — D631 Anemia in chronic kidney disease: Secondary | ICD-10-CM | POA: Diagnosis not present

## 2020-01-03 DIAGNOSIS — I132 Hypertensive heart and chronic kidney disease with heart failure and with stage 5 chronic kidney disease, or end stage renal disease: Secondary | ICD-10-CM | POA: Diagnosis not present

## 2020-01-03 NOTE — Telephone Encounter (Signed)
Last refilled 12/04/19

## 2020-01-04 DIAGNOSIS — N2581 Secondary hyperparathyroidism of renal origin: Secondary | ICD-10-CM | POA: Diagnosis not present

## 2020-01-04 DIAGNOSIS — N186 End stage renal disease: Secondary | ICD-10-CM | POA: Diagnosis not present

## 2020-01-04 DIAGNOSIS — Z992 Dependence on renal dialysis: Secondary | ICD-10-CM | POA: Diagnosis not present

## 2020-01-04 MED ORDER — OXYCODONE-ACETAMINOPHEN 10-325 MG PO TABS: 1.0000 | ORAL_TABLET | ORAL | 0 refills | Status: DC | PRN

## 2020-01-06 DIAGNOSIS — Z992 Dependence on renal dialysis: Secondary | ICD-10-CM | POA: Diagnosis not present

## 2020-01-06 DIAGNOSIS — N2581 Secondary hyperparathyroidism of renal origin: Secondary | ICD-10-CM | POA: Diagnosis not present

## 2020-01-06 DIAGNOSIS — N186 End stage renal disease: Secondary | ICD-10-CM | POA: Diagnosis not present

## 2020-01-07 DIAGNOSIS — I2510 Atherosclerotic heart disease of native coronary artery without angina pectoris: Secondary | ICD-10-CM | POA: Diagnosis not present

## 2020-01-07 DIAGNOSIS — J449 Chronic obstructive pulmonary disease, unspecified: Secondary | ICD-10-CM | POA: Diagnosis not present

## 2020-01-07 DIAGNOSIS — J849 Interstitial pulmonary disease, unspecified: Secondary | ICD-10-CM | POA: Diagnosis not present

## 2020-01-07 DIAGNOSIS — I509 Heart failure, unspecified: Secondary | ICD-10-CM | POA: Diagnosis not present

## 2020-01-07 DIAGNOSIS — J9621 Acute and chronic respiratory failure with hypoxia: Secondary | ICD-10-CM | POA: Diagnosis not present

## 2020-01-09 DIAGNOSIS — N2581 Secondary hyperparathyroidism of renal origin: Secondary | ICD-10-CM | POA: Diagnosis not present

## 2020-01-09 DIAGNOSIS — M1990 Unspecified osteoarthritis, unspecified site: Secondary | ICD-10-CM | POA: Diagnosis not present

## 2020-01-09 DIAGNOSIS — Z992 Dependence on renal dialysis: Secondary | ICD-10-CM | POA: Diagnosis not present

## 2020-01-09 DIAGNOSIS — I739 Peripheral vascular disease, unspecified: Secondary | ICD-10-CM | POA: Diagnosis not present

## 2020-01-09 DIAGNOSIS — N186 End stage renal disease: Secondary | ICD-10-CM | POA: Diagnosis not present

## 2020-01-09 DIAGNOSIS — J449 Chronic obstructive pulmonary disease, unspecified: Secondary | ICD-10-CM | POA: Diagnosis not present

## 2020-01-11 DIAGNOSIS — Z992 Dependence on renal dialysis: Secondary | ICD-10-CM | POA: Diagnosis not present

## 2020-01-11 DIAGNOSIS — N186 End stage renal disease: Secondary | ICD-10-CM | POA: Diagnosis not present

## 2020-01-11 DIAGNOSIS — N2581 Secondary hyperparathyroidism of renal origin: Secondary | ICD-10-CM | POA: Diagnosis not present

## 2020-01-13 DIAGNOSIS — J449 Chronic obstructive pulmonary disease, unspecified: Secondary | ICD-10-CM | POA: Diagnosis not present

## 2020-01-13 DIAGNOSIS — N186 End stage renal disease: Secondary | ICD-10-CM | POA: Diagnosis not present

## 2020-01-13 DIAGNOSIS — J849 Interstitial pulmonary disease, unspecified: Secondary | ICD-10-CM | POA: Diagnosis not present

## 2020-01-13 DIAGNOSIS — N2581 Secondary hyperparathyroidism of renal origin: Secondary | ICD-10-CM | POA: Diagnosis not present

## 2020-01-13 DIAGNOSIS — Z992 Dependence on renal dialysis: Secondary | ICD-10-CM | POA: Diagnosis not present

## 2020-01-13 DIAGNOSIS — I2510 Atherosclerotic heart disease of native coronary artery without angina pectoris: Secondary | ICD-10-CM | POA: Diagnosis not present

## 2020-01-13 DIAGNOSIS — I509 Heart failure, unspecified: Secondary | ICD-10-CM | POA: Diagnosis not present

## 2020-01-13 DIAGNOSIS — J9621 Acute and chronic respiratory failure with hypoxia: Secondary | ICD-10-CM | POA: Diagnosis not present

## 2020-01-15 ENCOUNTER — Ambulatory Visit (INDEPENDENT_AMBULATORY_CARE_PROVIDER_SITE_OTHER): Payer: Medicare HMO | Admitting: Family Medicine

## 2020-01-15 ENCOUNTER — Other Ambulatory Visit: Payer: Self-pay

## 2020-01-15 VITALS — BP 140/58 | HR 64 | Temp 97.2°F | Ht 70.0 in | Wt 142.0 lb

## 2020-01-15 DIAGNOSIS — B028 Zoster with other complications: Secondary | ICD-10-CM

## 2020-01-15 MED ORDER — VALACYCLOVIR HCL 500 MG PO TABS: 500.0000 mg | ORAL_TABLET | Freq: Every day | ORAL | 0 refills | Status: DC
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
## 2020-01-16 DIAGNOSIS — N186 End stage renal disease: Secondary | ICD-10-CM | POA: Diagnosis not present

## 2020-01-16 DIAGNOSIS — Z992 Dependence on renal dialysis: Secondary | ICD-10-CM | POA: Diagnosis not present

## 2020-01-16 DIAGNOSIS — N2581 Secondary hyperparathyroidism of renal origin: Secondary | ICD-10-CM | POA: Diagnosis not present

## 2020-01-18 DIAGNOSIS — N186 End stage renal disease: Secondary | ICD-10-CM | POA: Diagnosis not present

## 2020-01-18 DIAGNOSIS — N2581 Secondary hyperparathyroidism of renal origin: Secondary | ICD-10-CM | POA: Diagnosis not present

## 2020-01-18 DIAGNOSIS — Z992 Dependence on renal dialysis: Secondary | ICD-10-CM | POA: Diagnosis not present

## 2020-01-19 DIAGNOSIS — T782XXA Anaphylactic shock, unspecified, initial encounter: Secondary | ICD-10-CM | POA: Insufficient documentation

## 2020-01-20 DIAGNOSIS — N2581 Secondary hyperparathyroidism of renal origin: Secondary | ICD-10-CM | POA: Diagnosis not present

## 2020-01-20 DIAGNOSIS — Z992 Dependence on renal dialysis: Secondary | ICD-10-CM | POA: Diagnosis not present

## 2020-01-20 DIAGNOSIS — N186 End stage renal disease: Secondary | ICD-10-CM | POA: Diagnosis not present

## 2020-01-22 ENCOUNTER — Ambulatory Visit: Payer: Medicare HMO | Admitting: Internal Medicine

## 2020-01-23 ENCOUNTER — Other Ambulatory Visit: Payer: Self-pay | Admitting: Internal Medicine

## 2020-01-23 DIAGNOSIS — N2581 Secondary hyperparathyroidism of renal origin: Secondary | ICD-10-CM | POA: Diagnosis not present

## 2020-01-23 DIAGNOSIS — Z992 Dependence on renal dialysis: Secondary | ICD-10-CM | POA: Diagnosis not present

## 2020-01-23 DIAGNOSIS — N186 End stage renal disease: Secondary | ICD-10-CM | POA: Diagnosis not present

## 2020-01-25 DIAGNOSIS — Z992 Dependence on renal dialysis: Secondary | ICD-10-CM | POA: Diagnosis not present

## 2020-01-25 DIAGNOSIS — N2581 Secondary hyperparathyroidism of renal origin: Secondary | ICD-10-CM | POA: Diagnosis not present

## 2020-01-25 DIAGNOSIS — N186 End stage renal disease: Secondary | ICD-10-CM | POA: Diagnosis not present
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
## 2020-01-26 ENCOUNTER — Other Ambulatory Visit: Payer: Self-pay

## 2020-01-26 ENCOUNTER — Encounter: Payer: Self-pay | Admitting: Internal Medicine

## 2020-01-26 ENCOUNTER — Ambulatory Visit (INDEPENDENT_AMBULATORY_CARE_PROVIDER_SITE_OTHER): Payer: Medicare HMO | Admitting: Internal Medicine

## 2020-01-26 ENCOUNTER — Telehealth: Payer: Self-pay | Admitting: Internal Medicine

## 2020-01-26 VITALS — BP 128/60 | HR 58 | Ht 70.0 in | Wt 140.0 lb

## 2020-01-26 DIAGNOSIS — I2510 Atherosclerotic heart disease of native coronary artery without angina pectoris: Secondary | ICD-10-CM | POA: Diagnosis not present

## 2020-01-26 NOTE — Telephone Encounter (Signed)
Scheduled his follow up in December.

## 2020-01-26 NOTE — Patient Instructions (Signed)
Medication Instructions:  No changes *If you need a refill on your cardiac medications before your next appointment, please call your pharmacy*   Lab Work: Lipids at Dialysis  If you have labs (blood work) drawn today and your tests are completely normal, you will receive your results only by: Marland Kitchen MyChart Message (if you have MyChart) OR . A paper copy in the mail If you have any lab test that is abnormal or we need to change your treatment, we will call you to review the results.   Testing/Procedures: none   Follow-Up: At [facility], you and your health needs are our priority.  As part of our continuing mission to provide you with exceptional heart care, we have created designated Provider Care Teams.  These Care Teams include your primary Cardiologist (physician) and Advanced Practice Providers (APPs -  Physician Assistants and Nurse Practitioners) who all work together to provide you with the care you need, when you need it.  Your next appointment:   6 month(s)  The format for your next appointment:   In Person  Provider:   You may see Dorris Carnes, MD or one of the following Advanced Practice Providers on your designated Care Team:    Richardson Dopp, PA-C  Robbie Lis, [place]    Other Instructions

## 2020-01-26 NOTE — Telephone Encounter (Signed)
Patient is returning call. There are no notes, lab results, or imaging results to reference to. Please advise.

## 2020-01-27 DIAGNOSIS — N2581 Secondary hyperparathyroidism of renal origin: Secondary | ICD-10-CM | POA: Diagnosis not present

## 2020-01-27 DIAGNOSIS — Z992 Dependence on renal dialysis: Secondary | ICD-10-CM | POA: Diagnosis not present

## 2020-01-27 DIAGNOSIS — N186 End stage renal disease: Secondary | ICD-10-CM | POA: Diagnosis not present

## 2020-01-27 DIAGNOSIS — E1122 Type 2 diabetes mellitus with diabetic chronic kidney disease: Secondary | ICD-10-CM | POA: Diagnosis not present

## 2020-01-30 DIAGNOSIS — N2581 Secondary hyperparathyroidism of renal origin: Secondary | ICD-10-CM | POA: Diagnosis not present

## 2020-01-30 DIAGNOSIS — Z992 Dependence on renal dialysis: Secondary | ICD-10-CM | POA: Diagnosis not present

## 2020-01-30 DIAGNOSIS — N186 End stage renal disease: Secondary | ICD-10-CM | POA: Diagnosis not present

## 2020-02-01 DIAGNOSIS — Z992 Dependence on renal dialysis: Secondary | ICD-10-CM | POA: Diagnosis not present

## 2020-02-01 DIAGNOSIS — N186 End stage renal disease: Secondary | ICD-10-CM | POA: Diagnosis not present

## 2020-02-01 DIAGNOSIS — N2581 Secondary hyperparathyroidism of renal origin: Secondary | ICD-10-CM | POA: Diagnosis not present

## 2020-02-02 DIAGNOSIS — N186 End stage renal disease: Secondary | ICD-10-CM | POA: Diagnosis not present

## 2020-02-03 DIAGNOSIS — N186 End stage renal disease: Secondary | ICD-10-CM | POA: Diagnosis not present

## 2020-02-03 DIAGNOSIS — N2581 Secondary hyperparathyroidism of renal origin: Secondary | ICD-10-CM | POA: Diagnosis not present

## 2020-02-03 DIAGNOSIS — Z992 Dependence on renal dialysis: Secondary | ICD-10-CM | POA: Diagnosis not present

## 2020-02-05 ENCOUNTER — Other Ambulatory Visit: Payer: Self-pay | Admitting: Family Medicine

## 2020-02-05 ENCOUNTER — Telehealth: Payer: Self-pay | Admitting: *Deleted

## 2020-02-05 MED ORDER — OXYCODONE-ACETAMINOPHEN 10-325 MG PO TABS: 1.0000 | ORAL_TABLET | ORAL | 0 refills | Status: DC | PRN

## 2020-02-05 NOTE — Telephone Encounter (Signed)
Pt left on vm that he needed refill on percocet  Last seen 01/15/20 [facility] [employer]

## 2020-02-06 DIAGNOSIS — Z992 Dependence on renal dialysis: Secondary | ICD-10-CM | POA: Diagnosis not present

## 2020-02-06 DIAGNOSIS — N2581 Secondary hyperparathyroidism of renal origin: Secondary | ICD-10-CM | POA: Diagnosis not present

## 2020-02-06 DIAGNOSIS — N186 End stage renal disease: Secondary | ICD-10-CM | POA: Diagnosis not present

## 2020-02-07 DIAGNOSIS — I2510 Atherosclerotic heart disease of native coronary artery without angina pectoris: Secondary | ICD-10-CM | POA: Diagnosis not present

## 2020-02-07 DIAGNOSIS — J9621 Acute and chronic respiratory failure with hypoxia: Secondary | ICD-10-CM | POA: Diagnosis not present

## 2020-02-07 DIAGNOSIS — J449 Chronic obstructive pulmonary disease, unspecified: Secondary | ICD-10-CM | POA: Diagnosis not present

## 2020-02-07 DIAGNOSIS — I509 Heart failure, unspecified: Secondary | ICD-10-CM | POA: Diagnosis not present

## 2020-02-07 DIAGNOSIS — J849 Interstitial pulmonary disease, unspecified: Secondary | ICD-10-CM | POA: Diagnosis not present

## 2020-02-08 DIAGNOSIS — Z992 Dependence on renal dialysis: Secondary | ICD-10-CM | POA: Diagnosis not present

## 2020-02-08 DIAGNOSIS — N2581 Secondary hyperparathyroidism of renal origin: Secondary | ICD-10-CM | POA: Diagnosis not present

## 2020-02-08 DIAGNOSIS — N186 End stage renal disease: Secondary | ICD-10-CM | POA: Diagnosis not present

## 2020-02-09 ENCOUNTER — Ambulatory Visit (INDEPENDENT_AMBULATORY_CARE_PROVIDER_SITE_OTHER): Payer: Medicare HMO | Admitting: Family Medicine

## 2020-02-09 ENCOUNTER — Other Ambulatory Visit: Payer: Self-pay

## 2020-02-09 VITALS — BP 130/60 | HR 69 | Temp 97.1°F | Ht 70.0 in | Wt 140.0 lb

## 2020-02-09 DIAGNOSIS — M7021 Olecranon bursitis, right elbow: Secondary | ICD-10-CM

## 2020-02-09 DIAGNOSIS — M1990 Unspecified osteoarthritis, unspecified site: Secondary | ICD-10-CM | POA: Diagnosis not present

## 2020-02-09 DIAGNOSIS — I739 Peripheral vascular disease, unspecified: Secondary | ICD-10-CM | POA: Diagnosis not present

## 2020-02-09 DIAGNOSIS — J449 Chronic obstructive pulmonary disease, unspecified: Secondary | ICD-10-CM | POA: Diagnosis not present

## 2020-02-09 DIAGNOSIS — N186 End stage renal disease: Secondary | ICD-10-CM | POA: Diagnosis not present

## 2020-02-09 MED ORDER — DOXYCYCLINE HYCLATE 100 MG PO TABS
100.0000 mg | ORAL_TABLET | Freq: Two times a day (BID) | ORAL | 0 refills | Status: DC
Start: 2020-02-09 — End: 2020-03-22
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
## 2020-02-09 NOTE — Addendum Note (Signed)
Addended by: Saundra Shelling on: 02/09/2020 11:55 AM   Modules accepted: Orders

## 2020-02-10 DIAGNOSIS — Z992 Dependence on renal dialysis: Secondary | ICD-10-CM | POA: Diagnosis not present

## 2020-02-10 DIAGNOSIS — N186 End stage renal disease: Secondary | ICD-10-CM | POA: Diagnosis not present

## 2020-02-10 DIAGNOSIS — N2581 Secondary hyperparathyroidism of renal origin: Secondary | ICD-10-CM | POA: Diagnosis not present

## 2020-02-11 IMAGING — CT CT CHEST WITHOUT CONTRAST
2 of 3 series · 14 of 36 positions shown, 17 images · non-contrast
Comparison: CT chest dated 03/02/2018.

CLINICAL DATA: Hemoptysis

EXAM:
CT CHEST WITHOUT CONTRAST
TECHNIQUE: Multidetector CT imaging of the chest was performed following the
standard protocol without IV contrast.

[Series 4: chest w/o 2mm st · axial · non-contrast · 0.80mm/px · z∈[+1344,+1604]mm · 11 of 154 slices shown, 14 images]
[im 12/154  mediastinal]
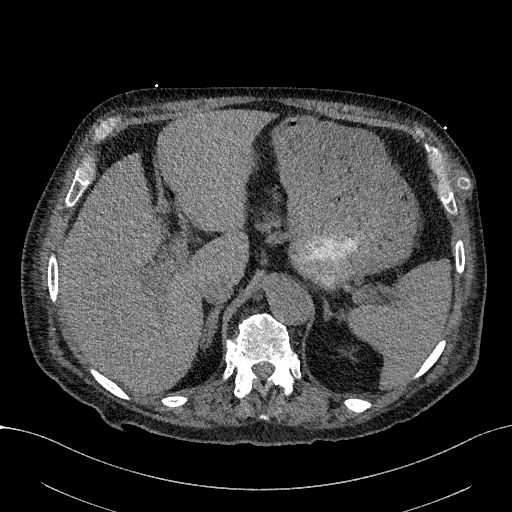
[im 12/154  lung]
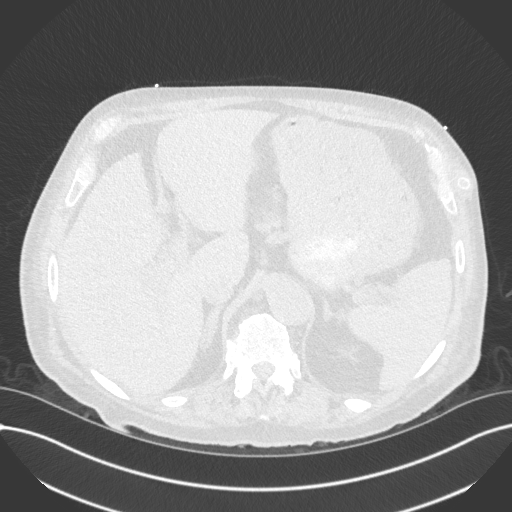
[im 23/154  lung]
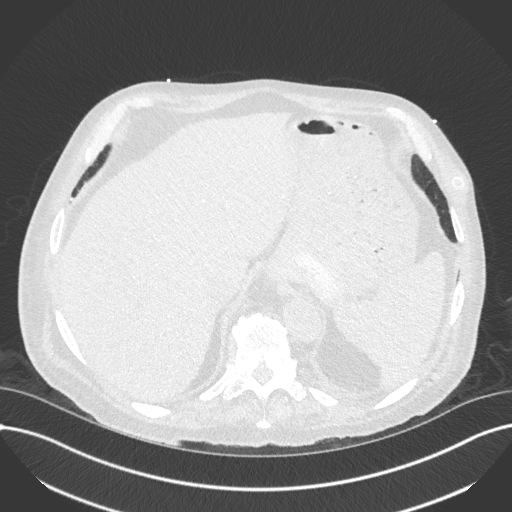
[im 35/154  lung]
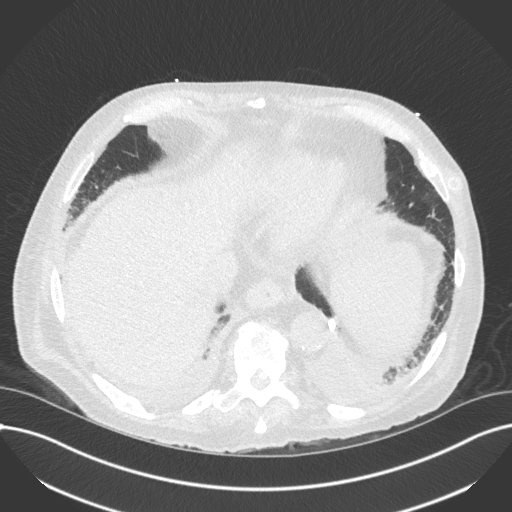
[im 52/154  lung]
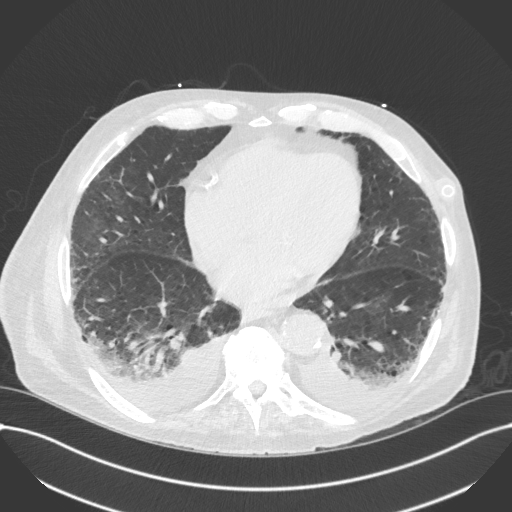
[im 63/154  mediastinal]
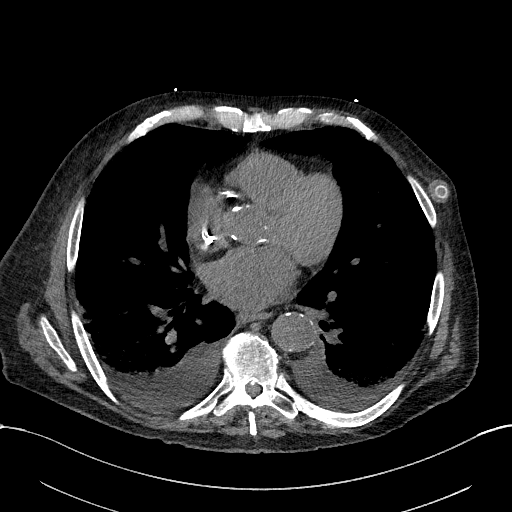
[im 63/154  lung]
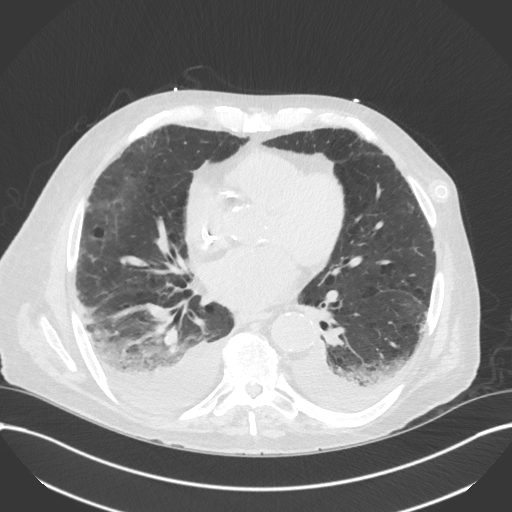
[im 80/154  lung]
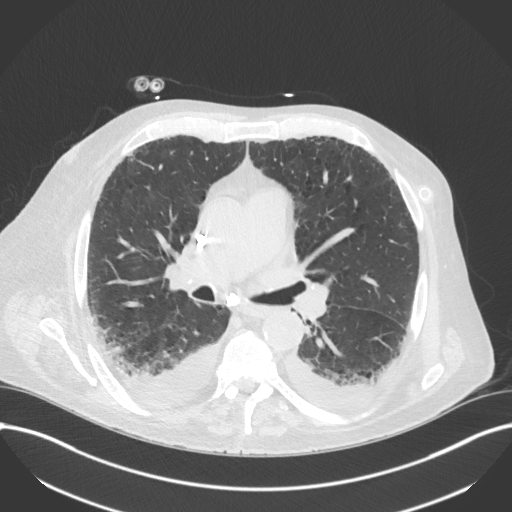
[im 91/154  lung]
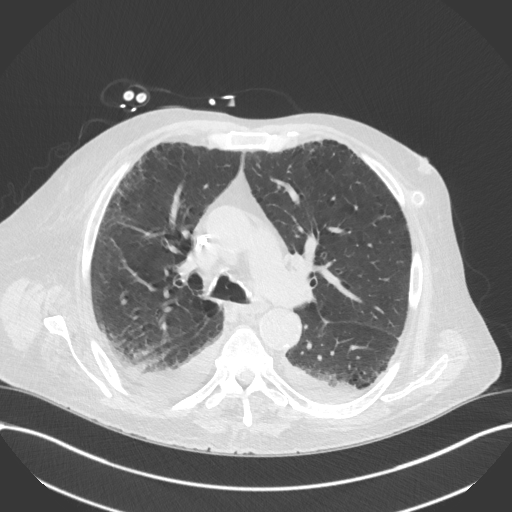
[im 103/154  lung]
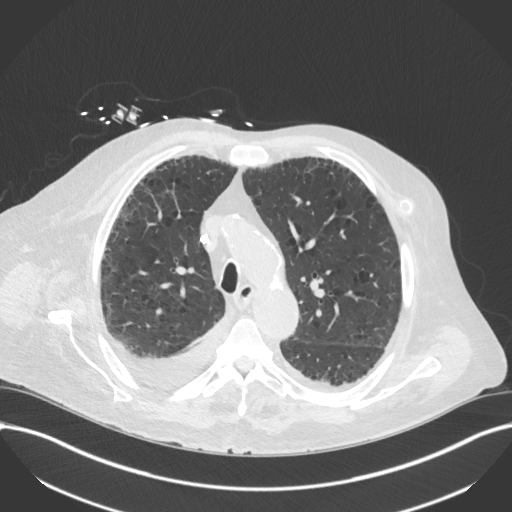
[im 120/154  mediastinal]
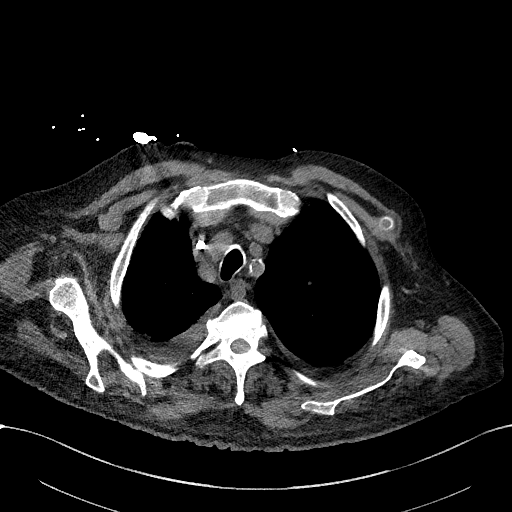
[im 120/154  lung]
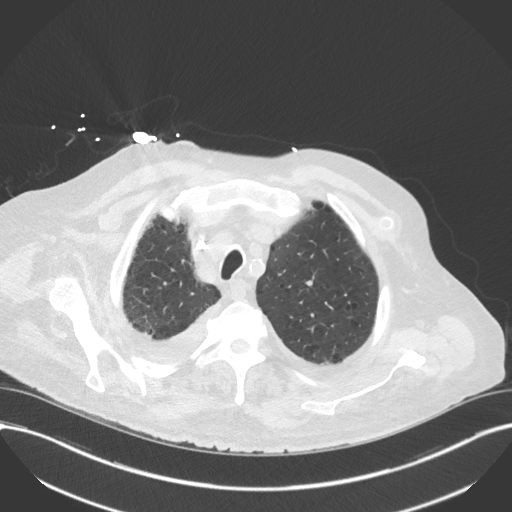
[im 131/154  lung]
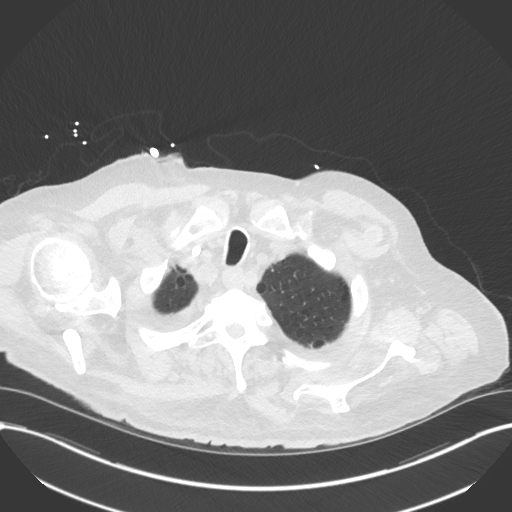
[im 142/154  lung]
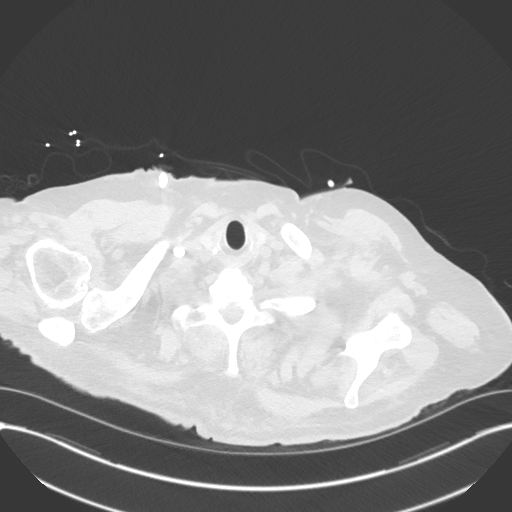

[Series 6: chest w/o 3mm st cor · coronal · non-contrast · 0.60mm/px · 3 of 101 slices shown]
[im 21/101  lung]
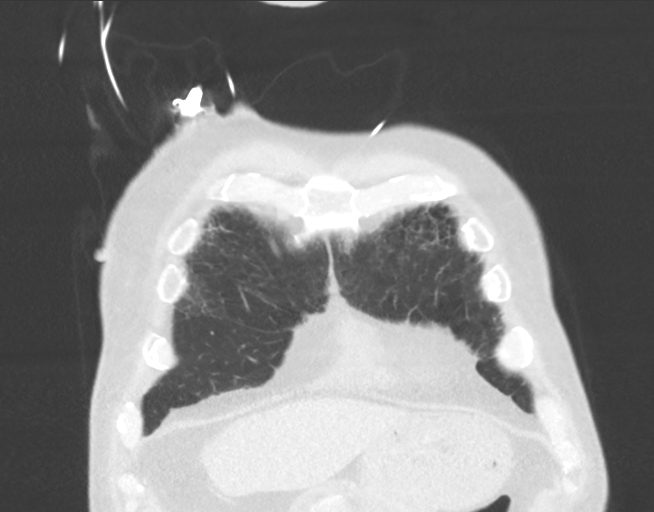
[im 41/101  lung]
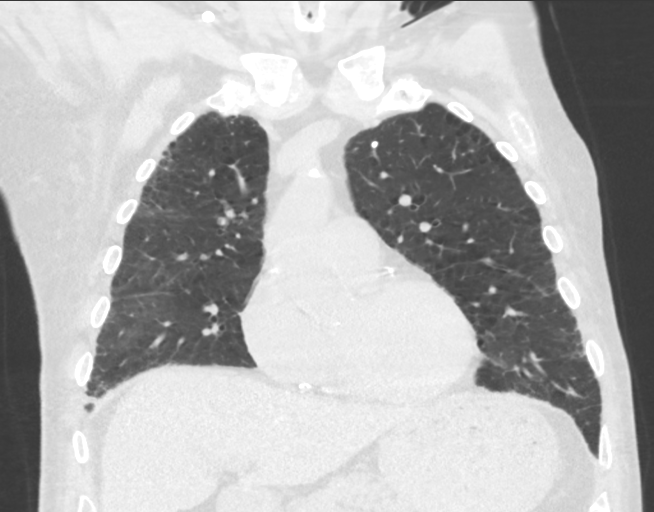
[im 61/101  lung]
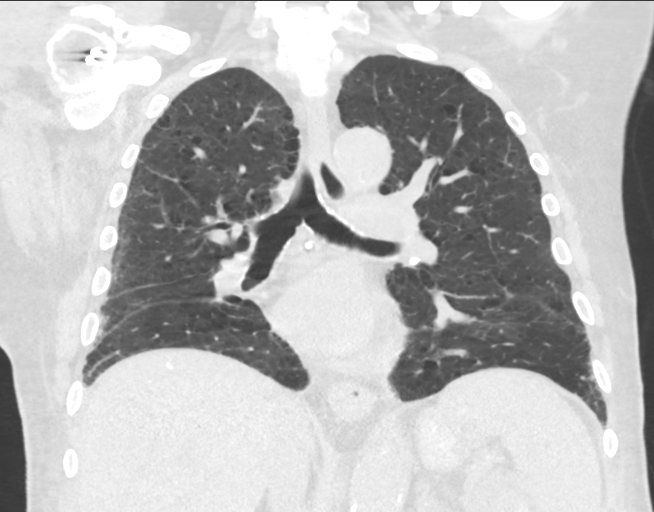

[14 of 36 positions shown; findings below may reference images not displayed]

FINDINGS: Cardiovascular: And there is a well-positioned tunneled dialysis
catheter with the tip terminating at the cavoatrial junction. The
cardiac size is moderately enlarged. Coronary artery calcifications
are noted. Atherosclerotic changes are noted of the thoracic aorta.

Mediastinum/Nodes: There are no pathologically enlarged mediastinal
or hilar lymph nodes. No significant axillary adenopathy. The
thyroid gland is unremarkable. There is no significant
supraclavicular adenopathy.

Lungs/Pleura: Emphysematous changes are noted bilaterally. There is
a 0.8 cm pulmonary nodule in the right upper lobe (axial series 3,
image 39). There is some motion artifact that limits evaluation of
the lung fields. There is a calcified granuloma in the lingula.
Small bilateral pleural effusions are noted. Bibasilar airspace
opacities are seen, right worse than left. The trachea is
unremarkable.

Upper Abdomen: Are multiple coarse calcifications are noted in the
dome of the liver. There is slightly greater than expected fullness
of the partially visualized pancreatic tail. This appears new since
prior study. The adrenal glands are unremarkable. Atherosclerotic
changes are noted of the abdominal aorta. A left presumed ax fem
bypass graft is noted. The patency of this cannot be determined on
this examination.

Musculoskeletal: No chest wall mass or suspicious bone lesions
identified.
IMPRESSION: 1. Small bilateral pleural effusions, right greater than left.
2. And bibasilar airspace opacities favored to represent compressive
atelectasis, however an underlying infiltrate is not entirely
excluded.
3. At emphysematous changes bilaterally.
4. A 0.8 cm nodule at the right upper lobe. This is not well
appreciated on CT chest dated 03/02/2018 follow-up is recommended.
Non-contrast chest CT at 6-12 months is recommended. If the nodule
is stable at time of repeat CT, then future CT at 18-24 months (from
today's scan) is considered optional for low-risk patients, but is
recommended for high-risk patients. This recommendation follows the
consensus statement: Guidelines for Management of Incidental
Pulmonary Nodules Detected on CT Images: From the [HOSPITAL]
5. Greater than expected soft tissue fullness at the level of the
pancreatic tail. This may be artifactual but appears new from CT
abdomen pelvis dated 05/06/2018. Correlation with nonemergent
outpatient CT of the abdomen is recommended for further evaluation.
6. Well-positioned right-sided tunneled dialysis catheter. A left
sided ax fem bypass graft is again noted, the patency of which
cannot be terminate on this exam.

Aortic Atherosclerosis (EEMSX-ZOF.F) and Emphysema (EEMSX-RVN.X).

## 2020-02-13 DIAGNOSIS — J9621 Acute and chronic respiratory failure with hypoxia: Secondary | ICD-10-CM | POA: Diagnosis not present

## 2020-02-13 DIAGNOSIS — I509 Heart failure, unspecified: Secondary | ICD-10-CM | POA: Diagnosis not present

## 2020-02-13 DIAGNOSIS — J849 Interstitial pulmonary disease, unspecified: Secondary | ICD-10-CM | POA: Diagnosis not present

## 2020-02-13 DIAGNOSIS — I2510 Atherosclerotic heart disease of native coronary artery without angina pectoris: Secondary | ICD-10-CM | POA: Diagnosis not present

## 2020-02-13 DIAGNOSIS — N186 End stage renal disease: Secondary | ICD-10-CM | POA: Diagnosis not present

## 2020-02-13 DIAGNOSIS — Z992 Dependence on renal dialysis: Secondary | ICD-10-CM | POA: Diagnosis not present

## 2020-02-13 DIAGNOSIS — J449 Chronic obstructive pulmonary disease, unspecified: Secondary | ICD-10-CM | POA: Diagnosis not present

## 2020-02-13 DIAGNOSIS — N2581 Secondary hyperparathyroidism of renal origin: Secondary | ICD-10-CM | POA: Diagnosis not present

## 2020-02-15 DIAGNOSIS — N186 End stage renal disease: Secondary | ICD-10-CM | POA: Diagnosis not present

## 2020-02-15 DIAGNOSIS — N2581 Secondary hyperparathyroidism of renal origin: Secondary | ICD-10-CM | POA: Diagnosis not present

## 2020-02-15 DIAGNOSIS — Z992 Dependence on renal dialysis: Secondary | ICD-10-CM | POA: Diagnosis not present

## 2020-02-15 LAB — ANAEROBIC AND AEROBIC CULTURE
AER RESULT:: NO GROWTH
MICRO NUMBER:: 10824709
MICRO NUMBER:: 10824710
SPECIMEN QUALITY:: ADEQUATE
SPECIMEN QUALITY:: ADEQUATE

## 2020-02-16 ENCOUNTER — Encounter: Payer: Self-pay | Admitting: Family Medicine

## 2020-02-16 ENCOUNTER — Ambulatory Visit (INDEPENDENT_AMBULATORY_CARE_PROVIDER_SITE_OTHER): Payer: Medicare HMO | Admitting: Family Medicine

## 2020-02-16 ENCOUNTER — Other Ambulatory Visit: Payer: Self-pay

## 2020-02-16 VITALS — BP 132/70 | HR 80 | Temp 98.2°F | Resp 14 | Ht 70.0 in | Wt 144.0 lb

## 2020-02-16 DIAGNOSIS — M7021 Olecranon bursitis, right elbow: Secondary | ICD-10-CM
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
## 2020-02-17 DIAGNOSIS — N186 End stage renal disease: Secondary | ICD-10-CM | POA: Diagnosis not present

## 2020-02-17 DIAGNOSIS — N2581 Secondary hyperparathyroidism of renal origin: Secondary | ICD-10-CM | POA: Diagnosis not present

## 2020-02-17 DIAGNOSIS — Z992 Dependence on renal dialysis: Secondary | ICD-10-CM | POA: Diagnosis not present

## 2020-02-19 ENCOUNTER — Inpatient Hospital Stay (HOSPITAL_COMMUNITY)
Admission: EM | Admit: 2020-02-19 | Discharge: 2020-02-24 | DRG: 640 | Disposition: A | Discharge status: Home Health | Payer: Medicare HMO | Attending: Internal Medicine | Admitting: Internal Medicine

## 2020-02-19 ENCOUNTER — Emergency Department (HOSPITAL_COMMUNITY): Payer: Medicare HMO

## 2020-02-19 ENCOUNTER — Other Ambulatory Visit: Payer: Self-pay

## 2020-02-19 ENCOUNTER — Ambulatory Visit: Payer: Self-pay

## 2020-02-19 ENCOUNTER — Encounter: Payer: Self-pay | Admitting: Orthopedic Surgery

## 2020-02-19 ENCOUNTER — Ambulatory Visit (INDEPENDENT_AMBULATORY_CARE_PROVIDER_SITE_OTHER): Payer: Medicare HMO | Admitting: Orthopedic Surgery

## 2020-02-19 ENCOUNTER — Encounter (HOSPITAL_COMMUNITY): Payer: Self-pay

## 2020-02-19 ENCOUNTER — Ambulatory Visit (INDEPENDENT_AMBULATORY_CARE_PROVIDER_SITE_OTHER): Payer: Medicare HMO

## 2020-02-19 VITALS — Ht 70.0 in | Wt 144.0 lb

## 2020-02-19 DIAGNOSIS — N250 Renal osteodystrophy: Secondary | ICD-10-CM | POA: Diagnosis not present

## 2020-02-19 DIAGNOSIS — G8929 Other chronic pain: Secondary | ICD-10-CM

## 2020-02-19 DIAGNOSIS — R627 Adult failure to thrive: Secondary | ICD-10-CM | POA: Diagnosis not present

## 2020-02-19 DIAGNOSIS — I4819 Other persistent atrial fibrillation: Secondary | ICD-10-CM | POA: Diagnosis present

## 2020-02-19 DIAGNOSIS — M7542 Impingement syndrome of left shoulder: Secondary | ICD-10-CM

## 2020-02-19 DIAGNOSIS — F17210 Nicotine dependence, cigarettes, uncomplicated: Secondary | ICD-10-CM | POA: Diagnosis present

## 2020-02-19 DIAGNOSIS — J440 Chronic obstructive pulmonary disease with acute lower respiratory infection: Secondary | ICD-10-CM | POA: Diagnosis present

## 2020-02-19 DIAGNOSIS — I509 Heart failure, unspecified: Secondary | ICD-10-CM | POA: Diagnosis not present

## 2020-02-19 DIAGNOSIS — R062 Wheezing: Secondary | ICD-10-CM | POA: Diagnosis not present

## 2020-02-19 DIAGNOSIS — J849 Interstitial pulmonary disease, unspecified: Secondary | ICD-10-CM | POA: Diagnosis present

## 2020-02-19 DIAGNOSIS — J80 Acute respiratory distress syndrome: Secondary | ICD-10-CM | POA: Diagnosis not present

## 2020-02-19 DIAGNOSIS — Z7189 Other specified counseling: Secondary | ICD-10-CM | POA: Diagnosis not present

## 2020-02-19 DIAGNOSIS — H9190 Unspecified hearing loss, unspecified ear: Secondary | ICD-10-CM | POA: Diagnosis present

## 2020-02-19 DIAGNOSIS — E8889 Other specified metabolic disorders: Secondary | ICD-10-CM | POA: Diagnosis present

## 2020-02-19 DIAGNOSIS — M7541 Impingement syndrome of right shoulder: Secondary | ICD-10-CM

## 2020-02-19 DIAGNOSIS — Z7982 Long term (current) use of aspirin: Secondary | ICD-10-CM

## 2020-02-19 DIAGNOSIS — K5900 Constipation, unspecified: Secondary | ICD-10-CM | POA: Diagnosis not present

## 2020-02-19 DIAGNOSIS — Z808 Family history of malignant neoplasm of other organs or systems: Secondary | ICD-10-CM

## 2020-02-19 DIAGNOSIS — M25512 Pain in left shoulder: Secondary | ICD-10-CM

## 2020-02-19 DIAGNOSIS — E1151 Type 2 diabetes mellitus with diabetic peripheral angiopathy without gangrene: Secondary | ICD-10-CM | POA: Diagnosis present

## 2020-02-19 DIAGNOSIS — E8779 Other fluid overload: Secondary | ICD-10-CM | POA: Diagnosis not present

## 2020-02-19 DIAGNOSIS — Z8719 Personal history of other diseases of the digestive system: Secondary | ICD-10-CM

## 2020-02-19 DIAGNOSIS — I2510 Atherosclerotic heart disease of native coronary artery without angina pectoris: Secondary | ICD-10-CM | POA: Diagnosis present

## 2020-02-19 DIAGNOSIS — Z888 Allergy status to other drugs, medicaments and biological substances status: Secondary | ICD-10-CM | POA: Diagnosis not present

## 2020-02-19 DIAGNOSIS — Z515 Encounter for palliative care: Secondary | ICD-10-CM

## 2020-02-19 DIAGNOSIS — K219 Gastro-esophageal reflux disease without esophagitis: Secondary | ICD-10-CM | POA: Diagnosis present

## 2020-02-19 DIAGNOSIS — E785 Hyperlipidemia, unspecified: Secondary | ICD-10-CM | POA: Diagnosis present

## 2020-02-19 DIAGNOSIS — I10 Essential (primary) hypertension: Secondary | ICD-10-CM | POA: Diagnosis present

## 2020-02-19 DIAGNOSIS — Z992 Dependence on renal dialysis: Secondary | ICD-10-CM

## 2020-02-19 DIAGNOSIS — Z8551 Personal history of malignant neoplasm of bladder: Secondary | ICD-10-CM | POA: Diagnosis not present

## 2020-02-19 DIAGNOSIS — M1990 Unspecified osteoarthritis, unspecified site: Secondary | ICD-10-CM | POA: Diagnosis present

## 2020-02-19 DIAGNOSIS — E1122 Type 2 diabetes mellitus with diabetic chronic kidney disease: Secondary | ICD-10-CM | POA: Diagnosis present

## 2020-02-19 DIAGNOSIS — Z8249 Family history of ischemic heart disease and other diseases of the circulatory system: Secondary | ICD-10-CM

## 2020-02-19 DIAGNOSIS — Z885 Allergy status to narcotic agent status: Secondary | ICD-10-CM | POA: Diagnosis not present

## 2020-02-19 DIAGNOSIS — Z833 Family history of diabetes mellitus: Secondary | ICD-10-CM

## 2020-02-19 DIAGNOSIS — Z20822 Contact with and (suspected) exposure to covid-19: Secondary | ICD-10-CM | POA: Diagnosis present

## 2020-02-19 DIAGNOSIS — J180 Bronchopneumonia, unspecified organism: Secondary | ICD-10-CM | POA: Diagnosis present

## 2020-02-19 DIAGNOSIS — Z86718 Personal history of other venous thrombosis and embolism: Secondary | ICD-10-CM

## 2020-02-19 DIAGNOSIS — Z91018 Allergy to other foods: Secondary | ICD-10-CM

## 2020-02-19 DIAGNOSIS — D631 Anemia in chronic kidney disease: Secondary | ICD-10-CM | POA: Diagnosis present

## 2020-02-19 DIAGNOSIS — E039 Hypothyroidism, unspecified: Secondary | ICD-10-CM | POA: Diagnosis present

## 2020-02-19 DIAGNOSIS — R079 Chest pain, unspecified: Secondary | ICD-10-CM | POA: Diagnosis not present

## 2020-02-19 DIAGNOSIS — I5032 Chronic diastolic (congestive) heart failure: Secondary | ICD-10-CM | POA: Diagnosis present

## 2020-02-19 DIAGNOSIS — I132 Hypertensive heart and chronic kidney disease with heart failure and with stage 5 chronic kidney disease, or end stage renal disease: Secondary | ICD-10-CM | POA: Diagnosis present

## 2020-02-19 DIAGNOSIS — Z7902 Long term (current) use of antithrombotics/antiplatelets: Secondary | ICD-10-CM

## 2020-02-19 DIAGNOSIS — I953 Hypotension of hemodialysis: Secondary | ICD-10-CM | POA: Diagnosis present

## 2020-02-19 DIAGNOSIS — M25511 Pain in right shoulder: Secondary | ICD-10-CM

## 2020-02-19 DIAGNOSIS — J90 Pleural effusion, not elsewhere classified: Secondary | ICD-10-CM | POA: Diagnosis not present

## 2020-02-19 DIAGNOSIS — I120 Hypertensive chronic kidney disease with stage 5 chronic kidney disease or end stage renal disease: Secondary | ICD-10-CM | POA: Diagnosis not present

## 2020-02-19 DIAGNOSIS — Z794 Long term (current) use of insulin: Secondary | ICD-10-CM

## 2020-02-19 DIAGNOSIS — J811 Chronic pulmonary edema: Secondary | ICD-10-CM | POA: Diagnosis not present

## 2020-02-19 DIAGNOSIS — J431 Panlobular emphysema: Secondary | ICD-10-CM | POA: Diagnosis not present

## 2020-02-19 DIAGNOSIS — J189 Pneumonia, unspecified organism: Secondary | ICD-10-CM | POA: Diagnosis not present

## 2020-02-19 DIAGNOSIS — R0602 Shortness of breath: Secondary | ICD-10-CM | POA: Diagnosis present

## 2020-02-19 DIAGNOSIS — Z7952 Long term (current) use of systemic steroids: Secondary | ICD-10-CM

## 2020-02-19 DIAGNOSIS — I4891 Unspecified atrial fibrillation: Secondary | ICD-10-CM | POA: Diagnosis not present

## 2020-02-19 DIAGNOSIS — E875 Hyperkalemia: Secondary | ICD-10-CM | POA: Diagnosis present

## 2020-02-19 DIAGNOSIS — N186 End stage renal disease: Secondary | ICD-10-CM | POA: Diagnosis present

## 2020-02-19 DIAGNOSIS — E1129 Type 2 diabetes mellitus with other diabetic kidney complication: Secondary | ICD-10-CM | POA: Diagnosis present

## 2020-02-19 DIAGNOSIS — R0600 Dyspnea, unspecified: Secondary | ICD-10-CM | POA: Diagnosis not present

## 2020-02-19 DIAGNOSIS — J810 Acute pulmonary edema: Secondary | ICD-10-CM | POA: Diagnosis not present

## 2020-02-19 DIAGNOSIS — Z9861 Coronary angioplasty status: Secondary | ICD-10-CM

## 2020-02-19 DIAGNOSIS — I959 Hypotension, unspecified: Secondary | ICD-10-CM | POA: Diagnosis not present

## 2020-02-19 DIAGNOSIS — Z79899 Other long term (current) drug therapy: Secondary | ICD-10-CM

## 2020-02-19 DIAGNOSIS — R0989 Other specified symptoms and signs involving the circulatory and respiratory systems: Secondary | ICD-10-CM | POA: Diagnosis present

## 2020-02-19 DIAGNOSIS — N2581 Secondary hyperparathyroidism of renal origin: Secondary | ICD-10-CM | POA: Diagnosis present

## 2020-02-19 DIAGNOSIS — Z79891 Long term (current) use of opiate analgesic: Secondary | ICD-10-CM

## 2020-02-19 DIAGNOSIS — J449 Chronic obstructive pulmonary disease, unspecified: Secondary | ICD-10-CM | POA: Diagnosis present

## 2020-02-19 DIAGNOSIS — I252 Old myocardial infarction: Secondary | ICD-10-CM

## 2020-02-19 DIAGNOSIS — K4090 Unilateral inguinal hernia, without obstruction or gangrene, not specified as recurrent: Secondary | ICD-10-CM | POA: Diagnosis present

## 2020-02-19 HISTORY — DX: Cardiac arrhythmia, unspecified: I49.9
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
## 2020-02-19 NOTE — ED Notes (Signed)
Patient transported to X-ray 

## 2020-02-19 NOTE — ED Triage Notes (Signed)
Pt arrived via [facility] from home w/ c/o shob. Pt has a hx of COPD and dialysis (T,Th,Sa) and is compliant. Pt went to PCP today for cortisone shots to bilateral shoulders. He noted that he was shob this afternoon. Upon EMS arrival pt was noted to have increased work of breathing and decreased breath sounds. Pt also has been coughing up yellowish red mucous. EMS gave 10 of albuterol, 0.5 of atrovent, 125mg  solumedrol, and 2g of magnesium. EMS noted wheezing and rhonchi breath sounds and reported that pt has greatly improved from initial assessment. Pt is A&Ox4, VSS and resting comfortably and in no apparent distress at this time.

## 2020-02-20 ENCOUNTER — Telehealth: Payer: Self-pay

## 2020-02-20 ENCOUNTER — Encounter (HOSPITAL_COMMUNITY): Payer: Self-pay | Admitting: Family Medicine

## 2020-02-20 ENCOUNTER — Encounter: Payer: Self-pay | Admitting: Family Medicine

## 2020-02-20 DIAGNOSIS — R627 Adult failure to thrive: Secondary | ICD-10-CM | POA: Diagnosis not present

## 2020-02-20 DIAGNOSIS — Z20822 Contact with and (suspected) exposure to covid-19: Secondary | ICD-10-CM | POA: Diagnosis present

## 2020-02-20 DIAGNOSIS — F17210 Nicotine dependence, cigarettes, uncomplicated: Secondary | ICD-10-CM | POA: Diagnosis present

## 2020-02-20 DIAGNOSIS — J849 Interstitial pulmonary disease, unspecified: Secondary | ICD-10-CM | POA: Diagnosis present

## 2020-02-20 DIAGNOSIS — Z885 Allergy status to narcotic agent status: Secondary | ICD-10-CM | POA: Diagnosis not present

## 2020-02-20 DIAGNOSIS — Z8719 Personal history of other diseases of the digestive system: Secondary | ICD-10-CM | POA: Diagnosis not present

## 2020-02-20 DIAGNOSIS — Z86718 Personal history of other venous thrombosis and embolism: Secondary | ICD-10-CM | POA: Diagnosis not present

## 2020-02-20 DIAGNOSIS — I132 Hypertensive heart and chronic kidney disease with heart failure and with stage 5 chronic kidney disease, or end stage renal disease: Secondary | ICD-10-CM | POA: Diagnosis present

## 2020-02-20 DIAGNOSIS — Z8551 Personal history of malignant neoplasm of bladder: Secondary | ICD-10-CM | POA: Diagnosis not present

## 2020-02-20 DIAGNOSIS — N2581 Secondary hyperparathyroidism of renal origin: Secondary | ICD-10-CM | POA: Diagnosis present

## 2020-02-20 DIAGNOSIS — N186 End stage renal disease: Secondary | ICD-10-CM | POA: Diagnosis present

## 2020-02-20 DIAGNOSIS — G8929 Other chronic pain: Secondary | ICD-10-CM | POA: Diagnosis present

## 2020-02-20 DIAGNOSIS — E1151 Type 2 diabetes mellitus with diabetic peripheral angiopathy without gangrene: Secondary | ICD-10-CM | POA: Diagnosis present

## 2020-02-20 DIAGNOSIS — R0602 Shortness of breath: Secondary | ICD-10-CM | POA: Diagnosis present

## 2020-02-20 DIAGNOSIS — Z888 Allergy status to other drugs, medicaments and biological substances status: Secondary | ICD-10-CM | POA: Diagnosis not present

## 2020-02-20 DIAGNOSIS — E875 Hyperkalemia: Secondary | ICD-10-CM

## 2020-02-20 DIAGNOSIS — I4819 Other persistent atrial fibrillation: Secondary | ICD-10-CM | POA: Diagnosis present

## 2020-02-20 DIAGNOSIS — E1122 Type 2 diabetes mellitus with diabetic chronic kidney disease: Secondary | ICD-10-CM | POA: Diagnosis present

## 2020-02-20 DIAGNOSIS — J431 Panlobular emphysema: Secondary | ICD-10-CM | POA: Diagnosis not present

## 2020-02-20 DIAGNOSIS — M1990 Unspecified osteoarthritis, unspecified site: Secondary | ICD-10-CM | POA: Diagnosis present

## 2020-02-20 DIAGNOSIS — R079 Chest pain, unspecified: Secondary | ICD-10-CM | POA: Diagnosis not present

## 2020-02-20 DIAGNOSIS — I5032 Chronic diastolic (congestive) heart failure: Secondary | ICD-10-CM | POA: Diagnosis present

## 2020-02-20 DIAGNOSIS — I2510 Atherosclerotic heart disease of native coronary artery without angina pectoris: Secondary | ICD-10-CM | POA: Diagnosis present

## 2020-02-20 DIAGNOSIS — K219 Gastro-esophageal reflux disease without esophagitis: Secondary | ICD-10-CM | POA: Diagnosis present

## 2020-02-20 DIAGNOSIS — Z515 Encounter for palliative care: Secondary | ICD-10-CM | POA: Diagnosis not present

## 2020-02-20 DIAGNOSIS — H9190 Unspecified hearing loss, unspecified ear: Secondary | ICD-10-CM | POA: Diagnosis present

## 2020-02-20 DIAGNOSIS — J180 Bronchopneumonia, unspecified organism: Secondary | ICD-10-CM | POA: Diagnosis present

## 2020-02-20 DIAGNOSIS — E785 Hyperlipidemia, unspecified: Secondary | ICD-10-CM | POA: Diagnosis present

## 2020-02-20 DIAGNOSIS — I4891 Unspecified atrial fibrillation: Secondary | ICD-10-CM | POA: Diagnosis not present

## 2020-02-20 DIAGNOSIS — J440 Chronic obstructive pulmonary disease with acute lower respiratory infection: Secondary | ICD-10-CM | POA: Diagnosis present

## 2020-02-20 LAB — CBC
HCT: 34.2 % — ABNORMAL LOW (ref 39.0–52.0)
HCT: 35.1 % — ABNORMAL LOW (ref 39.0–52.0)
Hemoglobin: 10.7 g/dL — ABNORMAL LOW (ref 13.0–17.0)
Hemoglobin: 11.1 g/dL — ABNORMAL LOW (ref 13.0–17.0)
MCH: 30 pg (ref 26.0–34.0)
MCH: 29.5 pg (ref 26.0–34.0)
MCHC: 31.3 g/dL (ref 30.0–36.0)
MCHC: 31.6 g/dL (ref 30.0–36.0)
MCV: 95.8 fL (ref 80.0–100.0)
MCV: 93.4 fL (ref 80.0–100.0)
Platelets: 243 10*3/uL (ref 150–400)
Platelets: 221 10*3/uL (ref 150–400)
RBC: 3.57 MIL/uL — ABNORMAL LOW (ref 4.22–5.81)
RBC: 3.76 MIL/uL — ABNORMAL LOW (ref 4.22–5.81)
RDW: 20.8 % — ABNORMAL HIGH (ref 11.5–15.5)
RDW: 20.8 % — ABNORMAL HIGH (ref 11.5–15.5)
WBC: 11.7 10*3/uL — ABNORMAL HIGH (ref 4.0–10.5)
WBC: 12.3 10*3/uL — ABNORMAL HIGH (ref 4.0–10.5)
nRBC: 0.2 % (ref 0.0–0.2)
nRBC: 0.2 % (ref 0.0–0.2)

## 2020-02-20 LAB — I-STAT CHEM 8, ED
BUN: 122 mg/dL — ABNORMAL HIGH (ref 8–23)
Calcium, Ion: 0.99 mmol/L — ABNORMAL LOW (ref 1.15–1.40)
Chloride: 106 mmol/L (ref 98–111)
Creatinine, Ser: 8.6 mg/dL — ABNORMAL HIGH (ref 0.61–1.24)
Glucose, Bld: 138 mg/dL — ABNORMAL HIGH (ref 70–99)
HCT: 32 % — ABNORMAL LOW (ref 39.0–52.0)
Hemoglobin: 10.9 g/dL — ABNORMAL LOW (ref 13.0–17.0)
Potassium: 6.1 mmol/L — ABNORMAL HIGH (ref 3.5–5.1)
Sodium: 137 mmol/L (ref 135–145)
TCO2: 22 mmol/L (ref 22–32)

## 2020-02-20 LAB — HEMOGLOBIN A1C
Hgb A1c MFr Bld: 5.1 % (ref 4.8–5.6)
Mean Plasma Glucose: 100 mg/dL

## 2020-02-20 LAB — CBC WITH DIFFERENTIAL/PLATELET
Abs Immature Granulocytes: 0.07 10*3/uL (ref 0.00–0.07)
Basophils Absolute: 0 10*3/uL (ref 0.0–0.1)
Basophils Relative: 0 %
Eosinophils Absolute: 0 10*3/uL (ref 0.0–0.5)
Eosinophils Relative: 0 %
HCT: 35.2 % — ABNORMAL LOW (ref 39.0–52.0)
Hemoglobin: 11 g/dL — ABNORMAL LOW (ref 13.0–17.0)
Immature Granulocytes: 1 %
Lymphocytes Relative: 12 %
Lymphs Abs: 1.1 10*3/uL (ref 0.7–4.0)
MCH: 30.3 pg (ref 26.0–34.0)
MCHC: 31.3 g/dL (ref 30.0–36.0)
MCV: 97 fL (ref 80.0–100.0)
Monocytes Absolute: 0.1 10*3/uL (ref 0.1–1.0)
Monocytes Relative: 1 %
Neutro Abs: 7.5 10*3/uL (ref 1.7–7.7)
Neutrophils Relative %: 86 %
Platelets: 201 10*3/uL (ref 150–400)
RBC: 3.63 MIL/uL — ABNORMAL LOW (ref 4.22–5.81)
RDW: 21.3 % — ABNORMAL HIGH (ref 11.5–15.5)
WBC: 8.8 10*3/uL (ref 4.0–10.5)
nRBC: 0.2 % (ref 0.0–0.2)

## 2020-02-20 LAB — BASIC METABOLIC PANEL
Anion gap: 15 (ref 5–15)
Anion gap: 19 — ABNORMAL HIGH (ref 5–15)
BUN: 104 mg/dL — ABNORMAL HIGH (ref 8–23)
BUN: 99 mg/dL — ABNORMAL HIGH (ref 8–23)
CO2: 18 mmol/L — ABNORMAL LOW (ref 22–32)
CO2: 19 mmol/L — ABNORMAL LOW (ref 22–32)
Calcium: 8 mg/dL — ABNORMAL LOW (ref 8.9–10.3)
Calcium: 9.1 mg/dL (ref 8.9–10.3)
Chloride: 100 mmol/L (ref 98–111)
Chloride: 104 mmol/L (ref 98–111)
Creatinine, Ser: 7.65 mg/dL — ABNORMAL HIGH (ref 0.61–1.24)
Creatinine, Ser: 8.66 mg/dL — ABNORMAL HIGH (ref 0.61–1.24)
GFR calc Af Amer: 6 mL/min — ABNORMAL LOW (ref >60)
GFR calc Af Amer: 7 mL/min — ABNORMAL LOW (ref >60)
GFR calc non Af Amer: 5 mL/min — ABNORMAL LOW (ref >60)
GFR calc non Af Amer: 6 mL/min — ABNORMAL LOW (ref >60)
Glucose, Bld: 152 mg/dL — ABNORMAL HIGH (ref 70–99)
Glucose, Bld: 180 mg/dL — ABNORMAL HIGH (ref 70–99)
Potassium: 5.2 mmol/L — ABNORMAL HIGH (ref 3.5–5.1)
Potassium: 6.1 mmol/L — ABNORMAL HIGH (ref 3.5–5.1)
Sodium: 137 mmol/L (ref 135–145)
Sodium: 138 mmol/L (ref 135–145)

## 2020-02-20 LAB — GLUCOSE, CAPILLARY
Glucose-Capillary: 152 mg/dL — ABNORMAL HIGH (ref 70–99)
Glucose-Capillary: 115 mg/dL — ABNORMAL HIGH (ref 70–99)
Glucose-Capillary: 139 mg/dL — ABNORMAL HIGH (ref 70–99)
Glucose-Capillary: 108 mg/dL — ABNORMAL HIGH (ref 70–99)

## 2020-02-20 LAB — HEPATITIS B SURFACE ANTIGEN: Hepatitis B Surface Ag: NONREACTIVE

## 2020-02-20 LAB — CREATININE, SERUM
Creatinine, Ser: 7.8 mg/dL — ABNORMAL HIGH (ref 0.61–1.24)
GFR calc Af Amer: 7 mL/min — ABNORMAL LOW (ref >60)
GFR calc non Af Amer: 6 mL/min — ABNORMAL LOW (ref >60)

## 2020-02-20 LAB — TROPONIN I (HIGH SENSITIVITY)
Troponin I (High Sensitivity): 74 ng/L — ABNORMAL HIGH (ref <18)
Troponin I (High Sensitivity): 80 ng/L — ABNORMAL HIGH (ref <18)

## 2020-02-20 LAB — SARS CORONAVIRUS 2 BY RT PCR (HOSPITAL ORDER, PERFORMED IN ~~LOC~~ HOSPITAL LAB): SARS Coronavirus 2: NEGATIVE

## 2020-02-20 LAB — HEPATITIS B CORE ANTIBODY, TOTAL: Hep B Core Total Ab: NONREACTIVE

## 2020-02-20 MED ORDER — OXYCODONE-ACETAMINOPHEN 10-325 MG PO TABS: 1.0000 | ORAL_TABLET | ORAL | Status: DC | PRN

## 2020-02-20 MED ORDER — OXYCODONE HCL 5 MG PO TABS: 5.0000 mg | ORAL_TABLET | ORAL | Status: DC | PRN

## 2020-02-20 MED ORDER — EZETIMIBE 10 MG PO TABS
10.0000 mg | ORAL_TABLET | Freq: Every day | ORAL | Status: DC
  Administered 2020-02-20 – 2020-02-24 (×5): 10 mg via ORAL
  Filled 2020-02-20 (×5): qty 1

## 2020-02-20 MED ORDER — DIALYVITE 800/ZINC 0.8 MG PO TABS: 1.0000 | ORAL_TABLET | Freq: Every day | ORAL | Status: DC

## 2020-02-20 MED ORDER — INSULIN ASPART 100 UNIT/ML ~~LOC~~ SOLN
0.0000 [IU] | Freq: Three times a day (TID) | SUBCUTANEOUS | Status: DC
  Administered 2020-02-20 – 2020-02-21 (×2): 1 [IU] via SUBCUTANEOUS
  Administered 2020-02-22: 2 [IU] via SUBCUTANEOUS
  Administered 2020-02-22 – 2020-02-23 (×2): 1 [IU] via SUBCUTANEOUS
  Administered 2020-02-23: 2 [IU] via SUBCUTANEOUS
  Administered 2020-02-24: 1 [IU] via SUBCUTANEOUS

## 2020-02-20 MED ORDER — HEPARIN SODIUM (PORCINE) 5000 UNIT/ML IJ SOLN
5000.0000 [IU] | Freq: Three times a day (TID) | INTRAMUSCULAR | Status: DC
  Administered 2020-02-20 – 2020-02-24 (×12): 5000 [IU] via SUBCUTANEOUS
  Filled 2020-02-20 (×13): qty 1

## 2020-02-20 MED ORDER — SEVELAMER CARBONATE 800 MG PO TABS
800.0000 mg | ORAL_TABLET | Freq: Three times a day (TID) | ORAL | Status: DC
  Administered 2020-02-20 – 2020-02-21 (×5): 800 mg via ORAL
  Filled 2020-02-20 (×5): qty 1

## 2020-02-20 MED ORDER — DOXERCALCIFEROL 4 MCG/2ML IV SOLN
1.0000 ug | INTRAVENOUS | Status: DC
  Administered 2020-02-22: 1 ug via INTRAVENOUS
  Filled 2020-02-20 (×2): qty 2

## 2020-02-20 MED ORDER — IPRATROPIUM-ALBUTEROL 0.5-2.5 (3) MG/3ML IN SOLN: 3.0000 mL | Freq: Four times a day (QID) | RESPIRATORY_TRACT | Status: DC | PRN

## 2020-02-20 MED ORDER — ASPIRIN EC 81 MG PO TBEC
81.0000 mg | DELAYED_RELEASE_TABLET | Freq: Every day | ORAL | Status: DC
  Administered 2020-02-20 – 2020-02-24 (×5): 81 mg via ORAL
  Filled 2020-02-20 (×5): qty 1

## 2020-02-20 MED ORDER — ATORVASTATIN CALCIUM 80 MG PO TABS
80.0000 mg | ORAL_TABLET | Freq: Every day | ORAL | Status: DC
  Administered 2020-02-20 – 2020-02-23 (×4): 80 mg via ORAL
  Filled 2020-02-20 (×4): qty 1

## 2020-02-20 MED ORDER — NICOTINE 14 MG/24HR TD PT24
14.0000 mg | MEDICATED_PATCH | Freq: Every day | TRANSDERMAL | Status: DC
  Administered 2020-02-20 – 2020-02-24 (×5): 14 mg via TRANSDERMAL
  Filled 2020-02-20 (×5): qty 1

## 2020-02-20 MED ORDER — DEXTROSE 50 % IV SOLN
1.0000 | Freq: Once | INTRAVENOUS | Status: AC
  Administered 2020-02-20: 50 mL via INTRAVENOUS
  Filled 2020-02-20: qty 50

## 2020-02-20 MED ORDER — CHLORHEXIDINE GLUCONATE CLOTH 2 % EX PADS
6.0000 | MEDICATED_PAD | Freq: Every day | CUTANEOUS | Status: DC
  Administered 2020-02-23: 6 via TOPICAL

## 2020-02-20 MED ORDER — OXYCODONE-ACETAMINOPHEN 5-325 MG PO TABS
1.0000 | ORAL_TABLET | ORAL | Status: DC | PRN
  Administered 2020-02-20 (×2): 1 via ORAL
  Filled 2020-02-20 (×2): qty 1

## 2020-02-20 MED ORDER — AMIODARONE HCL 200 MG PO TABS
200.0000 mg | ORAL_TABLET | Freq: Every day | ORAL | Status: DC
  Administered 2020-02-20 – 2020-02-24 (×5): 200 mg via ORAL
  Filled 2020-02-20 (×5): qty 1

## 2020-02-20 MED ORDER — RENA-VITE PO TABS
1.0000 | ORAL_TABLET | Freq: Every day | ORAL | Status: DC
  Administered 2020-02-20 – 2020-02-23 (×4): 1 via ORAL
  Filled 2020-02-20 (×4): qty 1

## 2020-02-20 MED ORDER — ONDANSETRON HCL 4 MG/2ML IJ SOLN
4.0000 mg | Freq: Four times a day (QID) | INTRAMUSCULAR | Status: DC | PRN
  Administered 2020-02-23: 4 mg via INTRAVENOUS
  Filled 2020-02-20 (×3): qty 2

## 2020-02-20 MED ORDER — DOXERCALCIFEROL 4 MCG/2ML IV SOLN
INTRAVENOUS | Status: AC
  Administered 2020-02-20: 1 ug via INTRAVENOUS
  Filled 2020-02-20: qty 2

## 2020-02-20 MED ORDER — ONDANSETRON HCL 4 MG PO TABS: 4.0000 mg | ORAL_TABLET | Freq: Four times a day (QID) | ORAL | Status: DC | PRN

## 2020-02-20 MED ORDER — CALCIUM GLUCONATE 10 % IV SOLN
1.0000 g | Freq: Once | INTRAVENOUS | Status: AC
  Administered 2020-02-20: 1 g via INTRAVENOUS
  Filled 2020-02-20: qty 10

## 2020-02-20 MED ORDER — OXYCODONE HCL 5 MG PO TABS
ORAL_TABLET | ORAL | Status: AC
  Administered 2020-02-20: 5 mg via ORAL
  Filled 2020-02-20: qty 1

## 2020-02-20 MED ORDER — INSULIN ASPART 100 UNIT/ML ~~LOC~~ SOLN: 0.0000 [IU] | Freq: Every day | SUBCUTANEOUS | Status: DC

## 2020-02-20 MED ORDER — INSULIN ASPART 100 UNIT/ML ~~LOC~~ SOLN
5.0000 [IU] | Freq: Once | SUBCUTANEOUS | Status: AC
  Administered 2020-02-20: 5 [IU] via INTRAVENOUS

## 2020-02-20 MED ORDER — POLYETHYLENE GLYCOL 3350 17 G PO PACK
17.0000 g | PACK | Freq: Every day | ORAL | Status: DC | PRN
  Administered 2020-02-21 – 2020-02-23 (×3): 17 g via ORAL
  Filled 2020-02-20 (×3): qty 1

## 2020-02-20 MED ORDER — SODIUM ZIRCONIUM CYCLOSILICATE 5 G PO PACK
5.0000 g | PACK | Freq: Once | ORAL | Status: AC
  Administered 2020-02-20: 5 g via ORAL
  Filled 2020-02-20: qty 1

## 2020-02-20 NOTE — Telephone Encounter (Signed)
[name] daughter [name] called, he's currently in the hospital, His Hep C testing back in April showed antibodies, [name] said that his Vein [facility] Vascular office said they have him showing for positive Hep C. What would you like for me to tell [name]? She was not aware of any of this.

## 2020-02-20 NOTE — Telephone Encounter (Signed)
Printed out message from his provider

## 2020-02-20 NOTE — Procedures (Addendum)
   I was present at this dialysis session, have reviewed the session itself and made  appropriate changes Kelly Splinter MD [place] pager [phone]   02/20/2020, 10:32 AM

## 2020-02-20 NOTE — Progress Notes (Signed)
Pt brought to room and adjusted in bed. Left arm with 2 large skin tears. Area cleaned with NS, covered with vaseline gauze, non stick dressing. Advised of visitation rules, no distress observed.
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
## 2020-02-20 NOTE — Progress Notes (Signed)
Patient admitted earlier today by Dr. Harrold Donath. See H&P for details.  74 year old male with history of ESRD, COPD, atrial fibrillation, hypertension, CAD, diabetes, bladder cancer, presented to the emergency department not feeling well and short of breath.  Patient feels that at his dialysis treatments are not removing enough fluid.  Chest x-ray showed pulmonary edema.  On examination, patient currently tachypneic and visibly short of breath.  Patient admitted with edema with hypervolemia, hyperkalemia requiring hemodialysis.  Discussed with nephrology, will repeat chest x-ray in the morning after volume removal with hemodialysis today.   [name] D.O. [facility]  If 7PM-7AM, please contact night-coverage [email or web address] 02/20/2020, 3:19 PM

## 2020-02-20 NOTE — Telephone Encounter (Signed)
Patient tested negative for hepatitis C in 2017.  He had a positive antibody test in April 2020 however he then had a follow-up test to check for the actual virus in the blood not just an antibody.  The RNA test showed no active virus in the blood.  Positive HCV antibody screen without the presence of HCV RNA in the blood is consistent with a resolved past infection or a false positive HCV antibody.  That was the conclusion in April 2020

## 2020-02-21 ENCOUNTER — Inpatient Hospital Stay (HOSPITAL_COMMUNITY): Payer: Medicare HMO

## 2020-02-21 DIAGNOSIS — E875 Hyperkalemia: Principal | ICD-10-CM

## 2020-02-21 DIAGNOSIS — N186 End stage renal disease: Secondary | ICD-10-CM

## 2020-02-21 DIAGNOSIS — Z7189 Other specified counseling: Secondary | ICD-10-CM

## 2020-02-21 DIAGNOSIS — Z992 Dependence on renal dialysis: Secondary | ICD-10-CM

## 2020-02-21 DIAGNOSIS — R079 Chest pain, unspecified: Secondary | ICD-10-CM

## 2020-02-21 DIAGNOSIS — J431 Panlobular emphysema: Secondary | ICD-10-CM

## 2020-02-21 DIAGNOSIS — Z515 Encounter for palliative care: Secondary | ICD-10-CM

## 2020-02-21 DIAGNOSIS — R627 Adult failure to thrive: Secondary | ICD-10-CM

## 2020-02-21 LAB — RENAL FUNCTION PANEL
Albumin: 3 g/dL — ABNORMAL LOW (ref 3.5–5.0)
Anion gap: 15 (ref 5–15)
BUN: 68 mg/dL — ABNORMAL HIGH (ref 8–23)
CO2: 25 mmol/L (ref 22–32)
Calcium: 8.6 mg/dL — ABNORMAL LOW (ref 8.9–10.3)
Chloride: 95 mmol/L — ABNORMAL LOW (ref 98–111)
Creatinine, Ser: 5.64 mg/dL — ABNORMAL HIGH (ref 0.61–1.24)
GFR calc Af Amer: 10 mL/min — ABNORMAL LOW (ref >60)
GFR calc non Af Amer: 9 mL/min — ABNORMAL LOW (ref >60)
Glucose, Bld: 160 mg/dL — ABNORMAL HIGH (ref 70–99)
Phosphorus: 7.2 mg/dL — ABNORMAL HIGH (ref 2.5–4.6)
Potassium: 5.2 mmol/L — ABNORMAL HIGH (ref 3.5–5.1)
Sodium: 135 mmol/L (ref 135–145)

## 2020-02-21 LAB — HEPATITIS B SURFACE ANTIBODY, QUANTITATIVE: Hep B S AB Quant (Post): 24.9 m[IU]/mL (ref >9.9)

## 2020-02-21 LAB — CBC
HCT: 37.1 % — ABNORMAL LOW (ref 39.0–52.0)
Hemoglobin: 11.9 g/dL — ABNORMAL LOW (ref 13.0–17.0)
MCH: 30.3 pg (ref 26.0–34.0)
MCHC: 32.1 g/dL (ref 30.0–36.0)
MCV: 94.4 fL (ref 80.0–100.0)
Platelets: 203 10*3/uL (ref 150–400)
RBC: 3.93 MIL/uL — ABNORMAL LOW (ref 4.22–5.81)
RDW: 21.2 % — ABNORMAL HIGH (ref 11.5–15.5)
WBC: 9.4 10*3/uL (ref 4.0–10.5)
nRBC: 0.2 % (ref 0.0–0.2)

## 2020-02-21 LAB — GLUCOSE, CAPILLARY
Glucose-Capillary: 96 mg/dL (ref 70–99)
Glucose-Capillary: 129 mg/dL — ABNORMAL HIGH (ref 70–99)
Glucose-Capillary: 91 mg/dL (ref 70–99)
Glucose-Capillary: 113 mg/dL — ABNORMAL HIGH (ref 70–99)

## 2020-02-21 LAB — TROPONIN I (HIGH SENSITIVITY)
Troponin I (High Sensitivity): 127 ng/L (ref <18)
Troponin I (High Sensitivity): 102 ng/L (ref <18)

## 2020-02-21 MED ORDER — METHYLPREDNISOLONE SODIUM SUCC 40 MG IJ SOLR
40.0000 mg | Freq: Two times a day (BID) | INTRAMUSCULAR | Status: DC
  Administered 2020-02-21 – 2020-02-22 (×3): 40 mg via INTRAVENOUS
  Filled 2020-02-21 (×3): qty 1

## 2020-02-21 MED ORDER — CLOPIDOGREL BISULFATE 75 MG PO TABS
75.0000 mg | ORAL_TABLET | Freq: Every day | ORAL | Status: DC
  Administered 2020-02-21 – 2020-02-24 (×4): 75 mg via ORAL
  Filled 2020-02-21 (×4): qty 1

## 2020-02-21 MED ORDER — LORAZEPAM 1 MG PO TABS
1.0000 mg | ORAL_TABLET | Freq: Once | ORAL | Status: AC
  Administered 2020-02-21: 1 mg via ORAL
  Filled 2020-02-21: qty 1

## 2020-02-21 MED ORDER — SODIUM CHLORIDE 0.9 % IV SOLN
2.0000 g | INTRAVENOUS | Status: DC
  Administered 2020-02-21 – 2020-02-23 (×3): 2 g via INTRAVENOUS
  Filled 2020-02-21: qty 20
  Filled 2020-02-21: qty 2
  Filled 2020-02-21: qty 20

## 2020-02-21 MED ORDER — OXYCODONE HCL 5 MG PO TABS: 5.0000 mg | ORAL_TABLET | ORAL | Status: DC | PRN

## 2020-02-21 MED ORDER — OXYCODONE-ACETAMINOPHEN 5-325 MG PO TABS: 1.0000 | ORAL_TABLET | ORAL | Status: DC | PRN

## 2020-02-21 MED ORDER — SODIUM ZIRCONIUM CYCLOSILICATE 5 G PO PACK
5.0000 g | PACK | Freq: Every day | ORAL | Status: DC
  Administered 2020-02-21 – 2020-02-22 (×2): 5 g via ORAL
  Filled 2020-02-21 (×3): qty 1

## 2020-02-21 MED ORDER — AZITHROMYCIN 250 MG PO TABS
500.0000 mg | ORAL_TABLET | Freq: Every day | ORAL | Status: DC
  Administered 2020-02-21 – 2020-02-23 (×3): 500 mg via ORAL
  Filled 2020-02-21 (×3): qty 2

## 2020-02-21 MED ORDER — PREDNISONE 10 MG PO TABS
10.0000 mg | ORAL_TABLET | Freq: Every day | ORAL | Status: DC
  Administered 2020-02-21: 10 mg via ORAL
  Filled 2020-02-21: qty 1

## 2020-02-21 MED ORDER — HYDROMORPHONE HCL 1 MG/ML IJ SOLN
1.0000 mg | INTRAMUSCULAR | Status: DC | PRN
  Administered 2020-02-21 – 2020-02-23 (×8): 1 mg via INTRAVENOUS
  Filled 2020-02-21 (×8): qty 1
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
## 2020-02-21 NOTE — Plan of Care (Signed)
°  Problem: Clinical Measurements: °Goal: Will remain free from infection °Outcome: Progressing °  °Problem: Activity: °Goal: Risk for activity intolerance will decrease °Outcome: Progressing °  °Problem: Nutrition: °Goal: Adequate nutrition will be maintained °Outcome: Progressing °  °

## 2020-02-21 NOTE — Progress Notes (Addendum)
Subjective: Seen in room with daughter present, states breathing is better, reports some right-sided chest and right groin hernia discomfort O2 sat 100%, discussed with him and daughter chest x-ray better after 3.3 L HD UF yesterday, noted cardiology saw this a.m. no EKG ischemic changes no specific cardiac work-up recommended  Objective Vital signs in last 24 hours: Vitals:   02/20/20 1623 02/21/20 0027 02/21/20 0536 02/21/20 0901  BP: (!) 142/71 (!) 143/66 (!) 148/68 (!) 152/69  Pulse: 72 75 72 82  Resp: 20 18 18 20   Temp: 98.4 F (36.9 C) 98 F (36.7 C) 97.9 F (36.6 C) 97.9 F (36.6 C)  TempSrc: Oral Oral Oral Oral  SpO2: 98% 100% 100% 100%  Weight:       Weight change:   Physical Exam: General: Alert anxious but not in distress elderly male Heart: RRR, no murmur rub or gallop appreciated Lungs: Bilateral wheezing expiratory throughout, no respiratory distress Abdomen: Bowel sounds normal active, tender right groin hernia area otherwise nondistended not tender in other quadrants Extremities: No pedal edema Dialysis Access: Left arm AV fistula positive bruit   OP Dialysis Orders:  [facility] on TTS, 3 hours 45 minutes, 64.5 kg dry weight, 2K, 2.5 ca bath, left arm AV fistula, no heparin, Mircera last given 75 mcg 02/13/2020, Hectorol 1 mic q. dialysis   Problem/Plan: 1. Dyspnea, pulmonary edema/volume overload=  3.3 L UF yesterday improved chest x-ray. Also may have element of COPD with 1 pack/day tobacco abuse, needs dry weight lowered losing weight in the setting of he reports problems with his inguinal hernia. Looks better and CXR is 75% better w/ regard to edema. Will need a good standing wt and if here tomorrow can try to pull vol down further and establish new dry wt perhaps.  2. Hyperkalemia =probably due to dietary indiscretion with ESRD diet counseled patient again follow-up lab data K5.2 today 3.  ESRD -HD schedule TTS 4. Multilobular bilateral pneumonia pneumonia  IV antibiotics per admission 5. Hypertension/volume  -volume up as above, is on midodrine for blood pressure control dialysis 6. Right groin hernia= per patient in the past has been turned down in Past  surgical correction however significant discomfort and problems he would try and seek second opinion while here 7. COPD tobacco abuse-states smokes a pack a day counseled about stopping 8. Anemia  -Hgb 11.1> 11.9 no ESA needs for now follow-up trend 9. Metabolic bone disease -phosphorus 7.2 needing Renvela binder compliance as outpatient continue in hospital, corrected calcium at goal, continue vitamin D on hemodialysis  10. History of coronary disease seen by Dr. Pernell Dupre this a.m. for chest pain mostly right-sided with no further cardiac work-up recommended EKG no ischemic changes per Dr. Tamala Julian 11. Diabetes mellitus type 2 per admit 12. Nutrition -ALB 3.0 carb modified renal diet and protein supplements [place]  Ernest Haber, PA-C [place] [phone] 02/21/2020,11:53 AM  LOS: 1 day   Pt seen, examined and agree w A/P as above.  Kelly Splinter  MD 02/21/2020, 2:19 PM    Labs: Basic Metabolic Panel: Recent Labs  Lab 02/19/20 2330 02/20/20 0136 02/20/20 0141 02/20/20 0547 02/21/20 0921  NA 138   < > 137 137 135  K 6.1*   < > 6.1* 5.2* 5.2*  CL 104   < > 106 100 95*  CO2 19*  --   --  18* 25  GLUCOSE 152*   < > 138* 180* 160*  BUN 99*   < >  122* 104* 68*  CREATININE 7.65*   < > 8.60* 8.66* 5.64*  CALCIUM 8.0*  --   --  9.1 8.6*  PHOS  --   --   --   --  7.2*   < > = values in this interval not displayed.   Liver Function Tests: Recent Labs  Lab 02/21/20 0921  ALBUMIN 3.0*   No results for input(s): LIPASE, AMYLASE in the last 168 hours. No results for input(s): AMMONIA in the last 168 hours. CBC: Recent Labs  Lab 02/19/20 2330 02/20/20 0141 02/20/20 0325 02/20/20 0547 02/21/20 0921  WBC 8.8   < > 11.7* 12.3* 9.4  NEUTROABS 7.5  --   --   --    --   HGB 11.0*   < > 10.7* 11.1* 11.9*  HCT 35.2*   < > 34.2* 35.1* 37.1*  MCV 97.0  --  95.8 93.4 94.4  PLT 201   < > 243 221 203   < > = values in this interval not displayed.   Cardiac Enzymes: No results for input(s): CKTOTAL, CKMB, CKMBINDEX, TROPONINI in the last 168 hours. CBG: Recent Labs  Lab 02/20/20 1246 02/20/20 1621 02/20/20 2135 02/21/20 0626 02/21/20 1110  GLUCAP 115* 139* 108* 96 129*    Studies/Results: DG Chest 1 View  Result Date: 02/19/2020 CLINICAL DATA:  COPD, dyspnea EXAM: CHEST  1 VIEW COMPARISON:  11/30/2019 FINDINGS: There has developed diffuse interstitial pulmonary infiltrate most in keeping with moderate diffuse interstitial pulmonary edema. No pneumothorax or pleural effusion. Cardiac size within normal limits. No acute bone abnormality. IMPRESSION: Interval development of moderate diffuse interstitial pulmonary edema. Electronically Signed   By: Fidela Salisbury MD   On: 02/19/2020 23:58   DG Chest 2 View  Result Date: 02/21/2020 CLINICAL DATA:  74 year old male with history of pulmonary edema. Shortness of breath. EXAM: CHEST - 2 VIEW COMPARISON:  Chest x-ray 02/19/2020. FINDINGS: There continues to be patchy areas of interstitial prominence and some ill-defined airspace disease scattered throughout the lungs bilaterally, however, aeration has significantly improved compared to the prior examination. Trace bilateral pleural effusions. No pneumothorax. Heart size is normal. Upper mediastinal contours are within normal limits. Aortic atherosclerosis. IMPRESSION: 1. Improving aeration in the lungs bilaterally which may reflect resolving pulmonary edema or multilobar bilateral pneumonia. 2. Trace bilateral pleural effusions. 3. Aortic atherosclerosis. Electronically Signed   By: Vinnie Langton M.D.   On: 02/21/2020 08:12   XR Shoulder Left  Result Date: 02/19/2020 2 view radiographs of the left shoulder shows superior migration of the humeral head within the  glenoid.  XR Shoulder Right  Result Date: 02/19/2020 2 view radiographs of the right shoulder shows previous anchors from rotator cuff repair there is significant superior migration of the humeral head within the glenoid with articulation along the acromion.  Medications: . cefTRIAXone (ROCEPHIN)  IV 2 g (02/21/20 1142)   . amiodarone  200 mg Oral Daily  . aspirin EC  81 mg Oral Daily  . atorvastatin  80 mg Oral QHS  . azithromycin  500 mg Oral Daily  . Chlorhexidine Gluconate Cloth  6 each Topical Q0600  . clopidogrel  75 mg Oral Q breakfast  . doxercalciferol  1 mcg Intravenous Q T,Th,Sa-HD  . ezetimibe  10 mg Oral Daily  . heparin  5,000 Units Subcutaneous Q8H  . insulin aspart  0-5 Units Subcutaneous QHS  . insulin aspart  0-9 Units Subcutaneous TID WC  . multivitamin  1 tablet Oral QHS  .  nicotine  14 mg Transdermal Daily  . predniSONE  10 mg Oral Q breakfast  . sevelamer carbonate  800 mg Oral TID WC  . sodium zirconium cyclosilicate  5 g Oral Daily

## 2020-02-21 NOTE — Progress Notes (Addendum)
Progress Note    [name].  SHF:[id number] DOB: 01-14-1946  DOA: 02/19/2020 PCP: [name], MD    Brief Narrative:     Medical records reviewed and are as summarized below:  [name]. is an 74 y.o. male with medical history significant for end-stage renal disease, COPD, atrial fibrillation, hypertension, CAD, diabetes mellitus type 2, bladder cancer, chronic back pain, osteoarthritis who presents to the emergency room with complaint of not feeling well for the last few days.  Reports he had dialysis on Saturday but does not think they took enough fluid off at that time.  He has continued to have generalized weakness with shortness of breath worsened by exertion for the last few days.   Assessment/Plan:   Principal Problem:   Hyperkalemia Active Problems:   Essential hypertension   COPD (chronic obstructive pulmonary disease) ([name])   Type II diabetes mellitus with renal manifestations ([name])   CAD (coronary artery disease)   ESRD on dialysis ([name])   Volume overload in setting of HD -s/p HD yesterday -x ray shows ? Volume vs PNA -will start abx as he has WBC count but more than likely needs another HD  Chest pain -troponin elevated but patient is on HD -missed dose of plavix yesterday -stent in 2020 -cardiology consult appreciated  Hyperkalemia -s/p HD -still elevated, may need another HD Session    ESRD on dialysis [facility]) -nephrology consult appreciated    Essential hypertension Continue home antihypertensive medications    COPD (chronic obstructive pulmonary disease) ([place]) DuoNebs every 6 hours needed for cough shortness of breath, wheeze -is having wheezing, ? From exacerbation vs volume overload-- steroids IV for now and monitor    Type II diabetes mellitus with renal manifestations ([name]) Monitor blood sugars before every meal nightly and provide sliding scale insulin as needed for glycemic control -monitor closely while on  steorids    CAD (coronary artery disease) -see above, missed dose of plavix     Family Communication/Anticipated D/C date and plan/Code Status   DVT prophylaxis: Lovenox ordered. Code Status: Full Code.  Disposition Plan: Status is: Inpatient  Remains inpatient appropriate because:Inpatient level of care appropriate due to severity of illness   Dispo: The patient is from: Home              Anticipated d/c is to: Home              Anticipated d/c date is: 2 days              Patient currently is not medically stable to d/c.         Medical Consultants:    Palliative care  Renal  cards  Subjective:   Says he does not walk due to hernia-- would like to speak with palliative care as he says his quality of life is poor  Objective:    Vitals:   02/21/20 0027 02/21/20 0536 02/21/20 0901 02/21/20 1221  BP: (!) 143/66 (!) 148/68 (!) 152/69 (!) 142/58  Pulse: 75 72 82 80  Resp: 18 18 20 16   Temp: 98 F (36.7 C) 97.9 F (36.6 C) 97.9 F (36.6 C) 98.2 F (36.8 C)  TempSrc: Oral Oral Oral Oral  SpO2: 100% 100% 100% 99%  Weight:       No intake or output data in the 24 hours ending 02/21/20 1257 Filed Weights   02/20/20 0805  Weight: 67.8 kg    Exam:  General: Appearance:  Chronically ill appearing male who appears uncomfortable  Skin: Multiple tattoos   Lungs:    wheezing throughout  Heart:    Normal heart rate.    MS:   All extremities are intact.   Neurologic:   Awake, alert, oriented x 3.     Data Reviewed:   I have personally reviewed following labs and imaging studies:  Labs: Labs show the following:   Basic Metabolic Panel: Recent Labs  Lab 02/19/20 2330 02/19/20 2330 02/20/20 0136 02/20/20 0141 02/20/20 0141 02/20/20 0547 02/21/20 0921  NA 138  --   --  137  --  137 135  K 6.1*   < >  --  6.1*   < > 5.2* 5.2*  CL 104  --   --  106  --  100 95*  CO2 19*  --   --   --   --  18* 25  GLUCOSE 152*  --   --  138*  --  180* 160*   BUN 99*  --   --  122*  --  104* 68*  CREATININE 7.65*  --  7.80* 8.60*  --  8.66* 5.64*  CALCIUM 8.0*  --   --   --   --  9.1 8.6*  PHOS  --   --   --   --   --   --  7.2*   < > = values in this interval not displayed.   GFR Estimated Creatinine Clearance: 10.9 mL/min (A) (by C-G formula based on SCr of 5.64 mg/dL (H)). Liver Function Tests: Recent Labs  Lab 02/21/20 0921  ALBUMIN 3.0*   No results for input(s): LIPASE, AMYLASE in the last 168 hours. No results for input(s): AMMONIA in the last 168 hours. Coagulation profile No results for input(s): INR, PROTIME in the last 168 hours.  CBC: Recent Labs  Lab 02/19/20 2330 02/20/20 0141 02/20/20 0325 02/20/20 0547 02/21/20 0921  WBC 8.8  --  11.7* 12.3* 9.4  NEUTROABS 7.5  --   --   --   --   HGB 11.0* 10.9* 10.7* 11.1* 11.9*  HCT 35.2* 32.0* 34.2* 35.1* 37.1*  MCV 97.0  --  95.8 93.4 94.4  PLT 201  --  243 221 203   Cardiac Enzymes: No results for input(s): CKTOTAL, CKMB, CKMBINDEX, TROPONINI in the last 168 hours. BNP (last 3 results) No results for input(s): PROBNP in the last 8760 hours. CBG: Recent Labs  Lab 02/20/20 1246 02/20/20 1621 02/20/20 2135 02/21/20 0626 02/21/20 1110  GLUCAP 115* 139* 108* 96 129*   D-Dimer: No results for input(s): DDIMER in the last 72 hours. Hgb A1c: Recent Labs    02/20/20 0325  HGBA1C 5.1   Lipid Profile: No results for input(s): CHOL, HDL, LDLCALC, TRIG, CHOLHDL, LDLDIRECT in the last 72 hours. Thyroid function studies: No results for input(s): TSH, T4TOTAL, T3FREE, THYROIDAB in the last 72 hours.  Invalid input(s): FREET3 Anemia work up: No results for input(s): VITAMINB12, FOLATE, FERRITIN, TIBC, IRON, RETICCTPCT in the last 72 hours. Sepsis Labs: Recent Labs  Lab 02/19/20 2330 02/20/20 0325 02/20/20 0547 02/21/20 0921  WBC 8.8 11.7* 12.3* 9.4    Microbiology Recent Results (from the past 240 hour(s))  SARS Coronavirus 2 by RT PCR (hospital order,  performed in [facility] hospital lab) Nasopharyngeal Nasopharyngeal Swab     Status: None   Collection Time: 02/19/20 11:53 PM   Specimen: Nasopharyngeal Swab  Result Value Ref Range Status  SARS Coronavirus 2 NEGATIVE NEGATIVE Final    Comment: (NOTE) SARS-CoV-2 target nucleic acids are NOT DETECTED.  The SARS-CoV-2 RNA is generally detectable in upper and lower respiratory specimens during the acute phase of infection. The lowest concentration of SARS-CoV-2 viral copies this assay can detect is 250 copies / mL. A negative result does not preclude SARS-CoV-2 infection and should not be used as the sole basis for treatment or other patient management decisions.  A negative result may occur with improper specimen collection / handling, submission of specimen other than nasopharyngeal swab, presence of viral mutation(s) within the areas targeted by this assay, and inadequate number of viral copies (<250 copies / mL). A negative result must be combined with clinical observations, patient history, and epidemiological information.  Fact Sheet for Patients:   StrictlyIdeas.no  Fact Sheet for Healthcare Providers: BankingDealers.co.za  This test is not yet approved or  cleared by the [place] FDA and has been authorized for detection and/or diagnosis of SARS-CoV-2 by FDA under an Emergency Use Authorization (EUA).  This EUA will remain in effect (meaning this test can be used) for the duration of the COVID-19 declaration under Section 564(b)(1) of the Act, 21 U.S.C. section 360bbb-3(b)(1), unless the authorization is terminated or revoked sooner.  Performed at [facility], [place] [address] [place], [place] [address]     Procedures and diagnostic studies:  DG Chest 1 View  Result Date: 02/19/2020 CLINICAL DATA:  COPD, dyspnea EXAM: CHEST  1 VIEW COMPARISON:  11/30/2019 FINDINGS: There has developed diffuse interstitial  pulmonary infiltrate most in keeping with moderate diffuse interstitial pulmonary edema. No pneumothorax or pleural effusion. Cardiac size within normal limits. No acute bone abnormality. IMPRESSION: Interval development of moderate diffuse interstitial pulmonary edema. Electronically Signed   By: Fidela Salisbury MD   On: 02/19/2020 23:58   DG Chest 2 View  Result Date: 02/21/2020 CLINICAL DATA:  74 year old male with history of pulmonary edema. Shortness of breath. EXAM: CHEST - 2 VIEW COMPARISON:  Chest x-ray 02/19/2020. FINDINGS: There continues to be patchy areas of interstitial prominence and some ill-defined airspace disease scattered throughout the lungs bilaterally, however, aeration has significantly improved compared to the prior examination. Trace bilateral pleural effusions. No pneumothorax. Heart size is normal. Upper mediastinal contours are within normal limits. Aortic atherosclerosis. IMPRESSION: 1. Improving aeration in the lungs bilaterally which may reflect resolving pulmonary edema or multilobar bilateral pneumonia. 2. Trace bilateral pleural effusions. 3. Aortic atherosclerosis. Electronically Signed   By: Vinnie Langton M.D.   On: 02/21/2020 08:12   XR Shoulder Left  Result Date: 02/19/2020 2 view radiographs of the left shoulder shows superior migration of the humeral head within the glenoid.  XR Shoulder Right  Result Date: 02/19/2020 2 view radiographs of the right shoulder shows previous anchors from rotator cuff repair there is significant superior migration of the humeral head within the glenoid with articulation along the acromion.   Medications:   . amiodarone  200 mg Oral Daily  . aspirin EC  81 mg Oral Daily  . atorvastatin  80 mg Oral QHS  . azithromycin  500 mg Oral Daily  . Chlorhexidine Gluconate Cloth  6 each Topical Q0600  . clopidogrel  75 mg Oral Q breakfast  . doxercalciferol  1 mcg Intravenous Q T,Th,Sa-HD  . ezetimibe  10 mg Oral Daily  . heparin   5,000 Units Subcutaneous Q8H  . insulin aspart  0-5 Units Subcutaneous QHS  . insulin aspart  0-9  Units Subcutaneous TID WC  . methylPREDNISolone (SOLU-MEDROL) injection  40 mg Intravenous Q12H  . multivitamin  1 tablet Oral QHS  . nicotine  14 mg Transdermal Daily  . sevelamer carbonate  800 mg Oral TID WC  . sodium zirconium cyclosilicate  5 g Oral Daily   Continuous Infusions: . cefTRIAXone (ROCEPHIN)  IV 2 g (02/21/20 1142)     LOS: 1 day   Geradine Girt  [facility]   How to contact the [facility] Attending or Consulting provider [place] or covering provider during after hours [place], for this patient?  1. Check the care team in [facility] and look for a) attending/consulting [facility] provider listed and b) the [facility] team listed 2. Log into [email or web address] and use [place]'s universal password to access. If you do not have the password, please contact the hospital operator. 3. Locate the [facility] provider you are looking for under [facility] and page to a number that you can be directly reached. 4. If you still have difficulty reaching the provider, please page the [facility] (Director on Call) for the Hospitalists listed on amion for assistance.  02/21/2020, 12:57 PM

## 2020-02-21 NOTE — Consult Note (Addendum)
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 right sided pain after retuning from CXR this AM>>reproducible on exam. No reports of prior anginal similar to current symptoms  -CXR confirmed pulmonary edema>>treated with HD session. Continues to have bilateral wheezing on exam   -High-sensitivity troponin found to be mildly elevated at 74>80>127>>102 with no EKG changes on tracing performed this AM (last NSTEMI admission with hsT >2000) -Beta-blocker held on last hospital course secondary to hypotension at which time midodrine was initiated>>>BP stable today  -Low suspicion for ACS at this time given atypical presentation of pain in the right mid-axillary line which is reproducible on exam, mild hsT elevation and no acute EKG changes. Would repeat EKG now and assess for changes. No other significant CAD other than recently treated PCA lesion>>>if closed re-stenosis would see ST segment changes on EKG. Focus on other pain  etiologies. Likely needs HD today as he has significant wheezing on exam. IM suggesting palliative consult.  -Dilaudid ordered for pain   2.  Chronic diastolic CHF: -Felt to be fluid volume overloaded on hospital presentation>> nephrology following for HD for fluid volume management -Remains wheezy on physical exam with follow up CXR suggestive of possible PNA -WBC elevated on ED presentation>>>IM to treat empirically with abx  -May repeat echo this admission>>last echo 09/02/19 with EF at 50-55, WMA and G2DD  3.  ESRD on hemodialysis with hyperkalemia: -Management per nephrology -K+ on presentation at 6.1>>>5.1 today   4.  Atrial fibrillation: -Maintaining NSR with rates in 60-80's -Beta-blocker held during hospital admission 08/2019 in the setting of hypotension>>BP stable therefore may tolerate re-starting  -Continue amiodarone -Anticoagulation discontinued given the need for DAPT with ASA and Plavix in the setting of NSTEMI -Plan for possible discontinuation of ASA with resumption of Eliquis after 6 months post PCI per chart review.   5. Hypertension: -Stable, 142/58>152/69>148/68 -Beta-blocker stopped during hospitalization 08/2019 due to hypotension which required midodrine  -BP run low on HD days per [name] report    6. Hyperlipidemia: -Last LDL, 65 on 09/07/19 -Continue on statin and Zetia    For questions or updates, please contact [place] Please consult [email or web address] for contact  info under Cardiology/STEMI.   SignedKathyrn Drown NP-C [facility] Pager: [phone] 02/21/2020 10:36 AM

## 2020-02-21 NOTE — Progress Notes (Signed)
Critical Troponin 127, provider Vann made aware.

## 2020-02-22 DIAGNOSIS — I10 Essential (primary) hypertension: Secondary | ICD-10-CM

## 2020-02-22 DIAGNOSIS — I4891 Unspecified atrial fibrillation: Secondary | ICD-10-CM

## 2020-02-22 DIAGNOSIS — I5032 Chronic diastolic (congestive) heart failure: Secondary | ICD-10-CM

## 2020-02-22 LAB — RENAL FUNCTION PANEL
Albumin: 2.6 g/dL — ABNORMAL LOW (ref 3.5–5.0)
Anion gap: 20 — ABNORMAL HIGH (ref 5–15)
BUN: 91 mg/dL — ABNORMAL HIGH (ref 8–23)
CO2: 22 mmol/L (ref 22–32)
Calcium: 8.6 mg/dL — ABNORMAL LOW (ref 8.9–10.3)
Chloride: 90 mmol/L — ABNORMAL LOW (ref 98–111)
Creatinine, Ser: 6.75 mg/dL — ABNORMAL HIGH (ref 0.61–1.24)
GFR calc Af Amer: 8 mL/min — ABNORMAL LOW (ref >60)
GFR calc non Af Amer: 7 mL/min — ABNORMAL LOW (ref >60)
Glucose, Bld: 181 mg/dL — ABNORMAL HIGH (ref 70–99)
Phosphorus: 9.6 mg/dL — ABNORMAL HIGH (ref 2.5–4.6)
Potassium: 6.2 mmol/L — ABNORMAL HIGH (ref 3.5–5.1)
Sodium: 132 mmol/L — ABNORMAL LOW (ref 135–145)

## 2020-02-22 LAB — CBC
HCT: 33.4 % — ABNORMAL LOW (ref 39.0–52.0)
Hemoglobin: 10.5 g/dL — ABNORMAL LOW (ref 13.0–17.0)
MCH: 29.2 pg (ref 26.0–34.0)
MCHC: 31.4 g/dL (ref 30.0–36.0)
MCV: 93 fL (ref 80.0–100.0)
Platelets: 146 10*3/uL — ABNORMAL LOW (ref 150–400)
RBC: 3.59 MIL/uL — ABNORMAL LOW (ref 4.22–5.81)
RDW: 20.5 % — ABNORMAL HIGH (ref 11.5–15.5)
WBC: 12 10*3/uL — ABNORMAL HIGH (ref 4.0–10.5)
nRBC: 0 % (ref 0.0–0.2)

## 2020-02-22 LAB — GLUCOSE, CAPILLARY
Glucose-Capillary: 105 mg/dL — ABNORMAL HIGH (ref 70–99)
Glucose-Capillary: 136 mg/dL — ABNORMAL HIGH (ref 70–99)
Glucose-Capillary: 198 mg/dL — ABNORMAL HIGH (ref 70–99)
Glucose-Capillary: 173 mg/dL — ABNORMAL HIGH (ref 70–99)

## 2020-02-22 MED ORDER — NICOTINE POLACRILEX 2 MG MT GUM
2.0000 mg | CHEWING_GUM | OROMUCOSAL | Status: DC | PRN
  Administered 2020-02-23: 2 mg via ORAL
  Filled 2020-02-22 (×2): qty 1

## 2020-02-22 MED ORDER — SEVELAMER CARBONATE 800 MG PO TABS
2400.0000 mg | ORAL_TABLET | Freq: Three times a day (TID) | ORAL | Status: DC
  Administered 2020-02-22 – 2020-02-24 (×6): 2400 mg via ORAL
  Filled 2020-02-22 (×6): qty 3

## 2020-02-22 MED ORDER — PREDNISONE 20 MG PO TABS
40.0000 mg | ORAL_TABLET | Freq: Every day | ORAL | Status: DC
  Administered 2020-02-23 – 2020-02-24 (×2): 40 mg via ORAL
  Filled 2020-02-22 (×2): qty 2

## 2020-02-22 MED ORDER — NEPRO/CARBSTEADY PO LIQD
237.0000 mL | Freq: Two times a day (BID) | ORAL | Status: DC
  Administered 2020-02-22: 237 mL via ORAL

## 2020-02-22 MED ORDER — DOXERCALCIFEROL 4 MCG/2ML IV SOLN
INTRAVENOUS | Status: AC
  Filled 2020-02-22: qty 2

## 2020-02-22 NOTE — Progress Notes (Signed)
Progress Note    [name].  UMP:[id number] DOB: 01-Aug-1945  DOA: 02/19/2020 PCP: [name], MD    Brief Narrative:     Medical records reviewed and are as summarized below:  [name]. is an 74 y.o. male with medical history significant for end-stage renal disease, COPD, atrial fibrillation, hypertension, CAD, diabetes mellitus type 2, bladder cancer, chronic back pain, osteoarthritis who presents to the emergency room with complaint of not feeling well for the last few days.  Reports he had dialysis on Saturday but does not think they took enough fluid off at that time.  He has continued to have generalized weakness with shortness of breath worsened by exertion for the last few days.   Assessment/Plan:   Principal Problem:   Hyperkalemia Active Problems:   Essential hypertension   COPD (chronic obstructive pulmonary disease) ([name])   Type II diabetes mellitus with renal manifestations ([name])   CAD (coronary artery disease)   ESRD on dialysis ([name])   Volume overload in setting of HD -s/p HD yesterday -x ray shows ? Volume vs PNA -will start abx as he has WBC count but more than likely needs another HD  Chest pain -troponin elevated but patient is on HD -missed dose of plavix yesterday -stent in 2020 -cardiology consult appreciated -pain improved with abx  Hyperkalemia -s/p HD -still elevated, may need another HD Session    ESRD on dialysis [facility]) -nephrology consult appreciated    Essential hypertension Continue home antihypertensive medications    COPD (chronic obstructive pulmonary disease) ([place]) DuoNebs every 6 hours needed for cough shortness of breath, wheeze -is having wheezing- IV steroids wean to PO steroids    Type II diabetes mellitus with renal manifestations ([place]) Monitor blood sugars before every meal nightly and provide sliding scale insulin as needed for glycemic control -monitor closely while on steorids    CAD  (coronary artery disease) -see above, missed dose of plavix     Family Communication/Anticipated D/C date and plan/Code Status   DVT prophylaxis: Lovenox ordered. Code Status: Full Code.  Disposition Plan: Status is: Inpatient  Remains inpatient appropriate because:Inpatient level of care appropriate due to severity of illness   Dispo: The patient is from: Home              Anticipated d/c is to: Home              Anticipated d/c date is: 2 days              Patient currently is not medically stable to d/c.         Medical Consultants:    Palliative care  Renal  cards  Subjective:   Pain is much improved this PM Says he can eat hotdogs and hamburgers at home  Objective:    Vitals:   02/22/20 1200 02/22/20 1216 02/22/20 1225 02/22/20 1315  BP: 106/60 118/66 125/65 127/77  Pulse: 75 89 89 100  Resp: 20 19 20 20   Temp:   (!) 97.4 F (36.3 C) 98.9 F (37.2 C)  TempSrc:   Oral Oral  SpO2:  100%  98%  Weight:  63.5 kg      Intake/Output Summary (Last 24 hours) at 02/22/2020 1423 Last data filed at 02/22/2020 1216 Gross per 24 hour  Intake --  Output 3500 ml  Net -3500 ml   Filed Weights   02/21/20 1539 02/22/20 0810 02/22/20 1216  Weight: 66.4 kg 67.5 kg 63.5 kg  Exam:   General: Appearance:   chronically ill male in no acute distress     Lungs:     No wheezing, respirations unlabored  Heart:    Tachycardic.  No murmurs, rubs, or gallops.   MS:   All extremities are intact.   Neurologic:   Awake, alert, hard of hearing today    Data Reviewed:   I have personally reviewed following labs and imaging studies:  Labs: Labs show the following:   Basic Metabolic Panel: Recent Labs  Lab 02/19/20 2330 02/19/20 2330 02/20/20 0136 02/20/20 0141 02/20/20 0141 02/20/20 0547 02/20/20 0547 02/21/20 0921 02/22/20 0834  NA 138  --   --  137  --  137  --  135 132*  K 6.1*   < >  --  6.1*   < > 5.2*   < > 5.2* 6.2*  CL 104  --   --  106  --   100  --  95* 90*  CO2 19*  --   --   --   --  18*  --  25 22  GLUCOSE 152*  --   --  138*  --  180*  --  160* 181*  BUN 99*  --   --  122*  --  104*  --  68* 91*  CREATININE 7.65*   < > 7.80* 8.60*  --  8.66*  --  5.64* 6.75*  CALCIUM 8.0*  --   --   --   --  9.1  --  8.6* 8.6*  PHOS  --   --   --   --   --   --   --  7.2* 9.6*   < > = values in this interval not displayed.   GFR Estimated Creatinine Clearance: 8.5 mL/min (A) (by C-G formula based on SCr of 6.75 mg/dL (H)). Liver Function Tests: Recent Labs  Lab 02/21/20 0921 02/22/20 0834  ALBUMIN 3.0* 2.6*   No results for input(s): LIPASE, AMYLASE in the last 168 hours. No results for input(s): AMMONIA in the last 168 hours. Coagulation profile No results for input(s): INR, PROTIME in the last 168 hours.  CBC: Recent Labs  Lab 02/19/20 2330 02/19/20 2330 02/20/20 0141 02/20/20 0325 02/20/20 0547 02/21/20 0921 02/22/20 0834  WBC 8.8  --   --  11.7* 12.3* 9.4 12.0*  NEUTROABS 7.5  --   --   --   --   --   --   HGB 11.0*   < > 10.9* 10.7* 11.1* 11.9* 10.5*  HCT 35.2*   < > 32.0* 34.2* 35.1* 37.1* 33.4*  MCV 97.0  --   --  95.8 93.4 94.4 93.0  PLT 201  --   --  243 221 203 146*   < > = values in this interval not displayed.   Cardiac Enzymes: No results for input(s): CKTOTAL, CKMB, CKMBINDEX, TROPONINI in the last 168 hours. BNP (last 3 results) No results for input(s): PROBNP in the last 8760 hours. CBG: Recent Labs  Lab 02/21/20 1110 02/21/20 1613 02/21/20 2051 02/22/20 0616 02/22/20 1310  GLUCAP 129* 91 113* 105* 136*   D-Dimer: No results for input(s): DDIMER in the last 72 hours. Hgb A1c: Recent Labs    02/20/20 0325  HGBA1C 5.1   Lipid Profile: No results for input(s): CHOL, HDL, LDLCALC, TRIG, CHOLHDL, LDLDIRECT in the last 72 hours. Thyroid function studies: No results for input(s): TSH, T4TOTAL, T3FREE, THYROIDAB in the last 72  hours.  Invalid input(s): FREET3 Anemia work up: No results  for input(s): VITAMINB12, FOLATE, FERRITIN, TIBC, IRON, RETICCTPCT in the last 72 hours. Sepsis Labs: Recent Labs  Lab 02/20/20 0325 02/20/20 0547 02/21/20 0921 02/22/20 0834  WBC 11.7* 12.3* 9.4 12.0*    Microbiology Recent Results (from the past 240 hour(s))  SARS Coronavirus 2 by RT PCR (hospital order, performed in [facility] hospital lab) Nasopharyngeal Nasopharyngeal Swab     Status: None   Collection Time: 02/19/20 11:53 PM   Specimen: Nasopharyngeal Swab  Result Value Ref Range Status   SARS Coronavirus 2 NEGATIVE NEGATIVE Final    Comment: (NOTE) SARS-CoV-2 target nucleic acids are NOT DETECTED.  The SARS-CoV-2 RNA is generally detectable in upper and lower respiratory specimens during the acute phase of infection. The lowest concentration of SARS-CoV-2 viral copies this assay can detect is 250 copies / mL. A negative result does not preclude SARS-CoV-2 infection and should not be used as the sole basis for treatment or other patient management decisions.  A negative result may occur with improper specimen collection / handling, submission of specimen other than nasopharyngeal swab, presence of viral mutation(s) within the areas targeted by this assay, and inadequate number of viral copies (<250 copies / mL). A negative result must be combined with clinical observations, patient history, and epidemiological information.  Fact Sheet for Patients:   StrictlyIdeas.no  Fact Sheet for Healthcare Providers: BankingDealers.co.za  This test is not yet approved or  cleared by the [place] FDA and has been authorized for detection and/or diagnosis of SARS-CoV-2 by FDA under an Emergency Use Authorization (EUA).  This EUA will remain in effect (meaning this test can be used) for the duration of the COVID-19 declaration under Section 564(b)(1) of the Act, 21 U.S.C. section 360bbb-3(b)(1), unless the authorization is  terminated or revoked sooner.  Performed at [facility], [address], [place], [place] [address]     Procedures and diagnostic studies:  DG Chest 2 View  Result Date: 02/21/2020 CLINICAL DATA:  74 year old male with history of pulmonary edema. Shortness of breath. EXAM: CHEST - 2 VIEW COMPARISON:  Chest x-ray 02/19/2020. FINDINGS: There continues to be patchy areas of interstitial prominence and some ill-defined airspace disease scattered throughout the lungs bilaterally, however, aeration has significantly improved compared to the prior examination. Trace bilateral pleural effusions. No pneumothorax. Heart size is normal. Upper mediastinal contours are within normal limits. Aortic atherosclerosis. IMPRESSION: 1. Improving aeration in the lungs bilaterally which may reflect resolving pulmonary edema or multilobar bilateral pneumonia. 2. Trace bilateral pleural effusions. 3. Aortic atherosclerosis. Electronically Signed   By: Vinnie Langton M.D.   On: 02/21/2020 08:12    Medications:   . amiodarone  200 mg Oral Daily  . aspirin EC  81 mg Oral Daily  . atorvastatin  80 mg Oral QHS  . azithromycin  500 mg Oral Daily  . Chlorhexidine Gluconate Cloth  6 each Topical Q0600  . clopidogrel  75 mg Oral Q breakfast  . doxercalciferol      . doxercalciferol  1 mcg Intravenous Q T,Th,Sa-HD  . ezetimibe  10 mg Oral Daily  . feeding supplement (NEPRO CARB STEADY)  237 mL Oral BID BM  . heparin  5,000 Units Subcutaneous Q8H  . insulin aspart  0-5 Units Subcutaneous QHS  . insulin aspart  0-9 Units Subcutaneous TID WC  . multivitamin  1 tablet Oral QHS  . nicotine  14 mg Transdermal Daily  . [START  ON 02/23/2020] predniSONE  40 mg Oral Q breakfast  . sevelamer carbonate  2,400 mg Oral TID WC  . sodium zirconium cyclosilicate  5 g Oral Daily   Continuous Infusions: . cefTRIAXone (ROCEPHIN)  IV 2 g (02/22/20 1400)     LOS: 2 days   Geradine Girt  [facility]   How to  contact the [facility] Attending or Consulting provider [place] or covering provider during after hours [place], for this patient?  1. Check the care team in [facility] and look for a) attending/consulting [facility] provider listed and b) the [facility] team listed 2. Log into [email or web address] and use [place]'s universal password to access. If you do not have the password, please contact the hospital operator. 3. Locate the [facility] provider you are looking for under [facility] and page to a number that you can be directly reached. 4. If you still have difficulty reaching the provider, please page the [facility] (Director on Call) for the Hospitalists listed on amion for assistance.  02/22/2020, 2:23 PM

## 2020-02-22 NOTE — Progress Notes (Addendum)
Progress Note  Patient Name: [name]. Date of Encounter: 02/22/2020  Primary Cardiologist: Dr. Dorris Carnes, MD  Subjective   Symptoms improved today. Denies recurrent right sided pain this AM. Headed to HD now   Inpatient Medications    Scheduled Meds: . amiodarone  200 mg Oral Daily  . aspirin EC  81 mg Oral Daily  . atorvastatin  80 mg Oral QHS  . azithromycin  500 mg Oral Daily  . Chlorhexidine Gluconate Cloth  6 each Topical Q0600  . clopidogrel  75 mg Oral Q breakfast  . doxercalciferol  1 mcg Intravenous Q T,Th,Sa-HD  . ezetimibe  10 mg Oral Daily  . heparin  5,000 Units Subcutaneous Q8H  . insulin aspart  0-5 Units Subcutaneous QHS  . insulin aspart  0-9 Units Subcutaneous TID WC  . methylPREDNISolone (SOLU-MEDROL) injection  40 mg Intravenous Q12H  . multivitamin  1 tablet Oral QHS  . nicotine  14 mg Transdermal Daily  . sevelamer carbonate  800 mg Oral TID WC  . sodium zirconium cyclosilicate  5 g Oral Daily   Continuous Infusions: . cefTRIAXone (ROCEPHIN)  IV 2 g (02/21/20 1142)   PRN Meds: HYDROmorphone (DILAUDID) injection, ipratropium-albuterol, ondansetron **OR** ondansetron (ZOFRAN) IV, oxyCODONE-acetaminophen **AND** oxyCODONE, polyethylene glycol   Vital Signs    Vitals:   02/21/20 1539 02/21/20 1818 02/22/20 0100 02/22/20 0618  BP:  (!) 154/66 (!) 155/76 (!) 147/61  Pulse:  77 74 76  Resp:  18 17 17   Temp:  98.7 F (37.1 C) 97.8 F (36.6 C) 97.6 F (36.4 C)  TempSrc:  Oral Oral Oral  SpO2:  98% 97% 96%  Weight: 66.4 kg       Intake/Output Summary (Last 24 hours) at 02/22/2020 0759 Last data filed at 02/21/2020 1142 Gross per 24 hour  Intake 0 ml  Output --  Net 0 ml   Filed Weights   02/20/20 0805 02/21/20 1539  Weight: 67.8 kg 66.4 kg    Physical Exam   General: Ill appearing,  NAD Neck: Negative for carotid bruits. No JVD Lungs: Diminished in bilateral upper and lower lobes. No wheezes, rales, or rhonchi. Breathing  is unlabored. Cardiovascular: RRR with S1 S2. No murmurs Abdomen: Soft, non-tender, non-distended. No obvious abdominal masses. Extremities: No edema. Radial  pulses 2+ bilaterally Neuro: Alert and oriented. No focal deficits. No facial asymmetry. MAE spontaneously. Psych: Responds to questions appropriately with normal affect.    Labs    Chemistry Recent Labs  Lab 02/19/20 2330 02/19/20 2330 02/20/20 0136 02/20/20 0141 02/20/20 0547 02/21/20 0921  NA 138   < >  --  137 137 135  K 6.1*   < >  --  6.1* 5.2* 5.2*  CL 104   < >  --  106 100 95*  CO2 19*  --   --   --  18* 25  GLUCOSE 152*   < >  --  138* 180* 160*  BUN 99*   < >  --  122* 104* 68*  CREATININE 7.65*   < > 7.80* 8.60* 8.66* 5.64*  CALCIUM 8.0*  --   --   --  9.1 8.6*  ALBUMIN  --   --   --   --   --  3.0*  GFRNONAA 6*   < > 6*  --  5* 9*  GFRAA 7*   < > 7*  --  6* 10*  ANIONGAP 15  --   --   --  19* 15   < > = values in this interval not displayed.     Hematology Recent Labs  Lab 02/20/20 0325 02/20/20 0547 02/21/20 0921  WBC 11.7* 12.3* 9.4  RBC 3.57* 3.76* 3.93*  HGB 10.7* 11.1* 11.9*  HCT 34.2* 35.1* 37.1*  MCV 95.8 93.4 94.4  MCH 30.0 29.5 30.3  MCHC 31.3 31.6 32.1  RDW 20.8* 20.8* 21.2*  PLT 243 221 203    Cardiac EnzymesNo results for input(s): TROPONINI in the last 168 hours. No results for input(s): TROPIPOC in the last 168 hours.   BNPNo results for input(s): BNP, PROBNP in the last 168 hours.   DDimer No results for input(s): DDIMER in the last 168 hours.   Radiology    DG Chest 2 View  Result Date: 02/21/2020 CLINICAL DATA:  74 year old male with history of pulmonary edema. Shortness of breath. EXAM: CHEST - 2 VIEW COMPARISON:  Chest x-ray 02/19/2020. FINDINGS: There continues to be patchy areas of interstitial prominence and some ill-defined airspace disease scattered throughout the lungs bilaterally, however, aeration has significantly improved compared to the prior examination.  Trace bilateral pleural effusions. No pneumothorax. Heart size is normal. Upper mediastinal contours are within normal limits. Aortic atherosclerosis. IMPRESSION: 1. Improving aeration in the lungs bilaterally which may reflect resolving pulmonary edema or multilobar bilateral pneumonia. 2. Trace bilateral pleural effusions. 3. Aortic atherosclerosis. Electronically Signed   By: Vinnie Langton M.D.   On: 02/21/2020 08:12   Telemetry    02/22/20 NSR- Personally Reviewed  ECG    No new tracing as of 02/22/20- Personally Reviewed  Cardiac Studies   Cardiac cath 09/05/19   The left ventricular systolic function is normal. The left ventricular ejection fraction is 50-55% by visual estimate -apparent inferior hypokinesis  LV end diastolic pressure is moderate-severely elevated.  Mid RCA to Dist RCA lesion is 90% stenosed. (Heavily calcified focal lesion)  Prox RCA lesion is 40% stenosed. Prox RCA to Mid RCA lesion is 50% stenosed with 30% stenosed side branch in RV Branch. Mid RCA lesion is 35% stenosed.  Mid LAD lesion is 30% stenosed with 25% stenosed side branch in 2nd Diag.  SUMMARY  Distal RCA heavily calcified 90% lesion with moderate calcified disease upstream.  Minimal LCA disease.  Moderate to severely elevated LVEDP despite systemic hypotension postdialysis.  RECOMMENDATIONS  Patient has a severe calcified lesion in the RCA needs to be treated with atherectomy followed by PCI. We will plan staged atherectomy based PCI by Dr. Fletcher Anon on 09/06/2018 1 in the afternoon. (Precath orders written) ? We will load with 300 mg Plavix tonight and set her milligrams in the morning tomorrow. -> ? Plan will be to use Plavix plus DOAC on discharge (potentially aspirin for 1 month)  Continue aggressive GDMT for CAD-already on statin. Blood pressure likely not able to tolerate beta-blocker.   Athrectomy 09/06/2019:   Mid LAD lesion is 30% stenosed with 25% stenosed side branch in  2nd Diag.  Mid RCA to Dist RCA lesion is 90% stenosed.  Mid RCA lesion is 35% stenosed.  Prox RCA to Mid RCA lesion is 50% stenosed with 30% stenosed side branch in RV Branch.  Prox RCA lesion is 40% stenosed.  Post intervention, there is a 0% residual stenosis.  Post intervention, there is a 0% residual stenosis.  A drug-eluting stent was successfully placed using a STENT RESOLUTE ONYX 3.5X22.  Successful orbital atherectomy and drug-eluting stent placement to mid/distal right coronary artery.  Recommendations: Given the patient's  anemia, I do not think he will tolerate resumption of anticoagulation at the present time. Recommend treatment with dual antiplatelet therapy with aspirin and clopidogrel for at least 6 months and if the patient's anemia is improved then, resuming anticoagulation can be considered with stopping aspirin. Continue aggressive treatment of risk factors.  Diagnostic Dominance: Right  Intervention      Patient Profile     74 y.o. male with a hx of CAD s/p BMS to RCA in 2007, negative Myoview stress test 2010, PVD (occluded abdominal aorta >>s/p Ax-Fem bypass), carotid artery disease, ESRD on HD, prior DVT, atrial fibrillation on chronic anticoagulation with Eliquis, COPD, hypertension, hyperlipidemia, DM2, history of bladder cancer, and diastolic CHF who is being seen today for the evaluation of chest pain at the request of Dr. Eliseo Squires.  Assessment & Plan    1.  Atypical right sided, mid axillary/chest pain with known CAD: -Patient has a history of BMS to RCA in 2007 with recent hospitalization 08/2019 for non-STEMI s/p arthrectomy and DES stenting to mid to distal RCA lesion with recommendations for DAPT with ASA and Plavix for at least 6 months, holding his anticoagulation. hsT at that time were greater than 2000 -Patient presented with generalized fatigue,R sided chest discomfort and shortness of breath   He was felt to be fluid volume overloaded  this admission.  -Low suspicion for ACS given atypical presentation of pain in the right mid-axillary line which is reproducible on exam, Triv elevation of troponin   No EKG changes   This is not the presentation of subacute stent problem at 6 mnths (that would be stent closure)    Follow   Continue volume  2.  Chronic diastolic CHF: -Felt to be fluid volume overloaded on hospital presentation>> nephrology following for HD for fluid volume management -Wheezing improved -May repeat echo this admission>>last echo 09/02/19 with EF at 50-55, WMA and G2DD  3.  ESRD on hemodialysis with hyperkalemia: -Management per nephrology -K+ on presentation at 6.1>>>5.2 yesterday    4.  Atrial fibrillation: -Maintaining NSR with rates in 60-80's -Beta-blocker held during hospital admission 08/2019 in the setting of hypotension>>BP stable therefore may tolerate re-starting  -Continue amiodarone -Anticoagulation discontinued given the need for DAPT with ASA and Plavix in the setting of NSTEMI -Plan for possible discontinuation of ASA with resumption of Eliquis after 6 months post PCI per chart review.   5. Hypertension: -Stable, 147/61>155/76>154/66 -Beta-blocker stopped during hospitalization 08/2019 due to hypotension which required midodrine  -BP run low on HD days per patient report   -Add BB back to regimen?   6. Hyperlipidemia: -Last LDL, 65 on 09/07/19 -Continue on statin and Zetia   Signed, Kathyrn Drown NP-C [facility] Pager: [phone] 02/22/2020, 7:59 AM    Pt seen and examined   I have amended note above by Kathyrn Drown   Pt is very familiar to me from clinic   I just saw him a few wks ago.   At that time we discussed his symptoms of SOB on Mondays, just prior to dialysis on Tuesday   I was concerned that he was not getting enough dialyzed  Off at each session  The pt presented now to [facility] with acute SOB and R sided CP    This is now much improved from yesterday   He says CP is  similar to prior to stent but he is a difficult historian Troponin minimally elevated at 127, 102 (in setting of dialysis)    Note  K was significantly elevated this am  The pt admits to eating high contraining foods  Currently the pt is comfortable   Still with minimal chest tightness  No pain  BP is improved 100s to 120s  Lungs are relatively clear Cardiac RRR  S1, S2   No significant murmurs Ext are without edema  Will continue to follow   Will review with renal re dialysis Keep on same regimen of ASA / Plavix for now     For questions or updates, please contact   Please consult [email or web address] for contact info under Cardiology/STEMI.

## 2020-02-22 NOTE — Progress Notes (Signed)
Subjective: Seen on hemodialysis, denies any shortness of breath denies any chest pain today, asking for hernia binder as he uses 1 at home, discussed need for standing weight post dialysis  Objective Vital signs in last 24 hours: Vitals:   02/22/20 0930 02/22/20 1000 02/22/20 1030 02/22/20 1100  BP: 126/70 117/65 126/65 111/67  Pulse: 82 86 81 83  Resp: 16 15 13 16   Temp:      TempSrc:      SpO2:      Weight:       Weight change: -1.439 kg  Physical Exam: General:  On dialysis alert NAD chronically ill  elderly male Heart: RRR, no murmur rub or gallop appreciated Lungs:  CTA, unlabored breathing  Abdomen: Bowel sounds normal active, tender right groin hernia area otherwise nondistended not tender in other quadrants Extremities: No pedal edema Dialysis Access: Left arm AV fistula patent no dialysis   OP Dialysis Orders:[facility] on TTS, 3 hours 45 minutes, 64.5 kg dry weight, 2K, 2.5 ca bath, left arm AV fistula, no heparin, Mircera last given 75 mcg 02/13/2020, Hectorol 1 mic q. dialysis   Problem/Plan: 1. Dyspnea, pulmonary edema/volume overload= 3.3 male UF HD 8/24 with improved chest x-ray. Also dyspnea element of COPD with 1 pack/day tobacco abuse, needs dry weight lowered losing weight in the setting of he reports problems with his inguinal hernia.  Weight loss , 8/25 chest x-ray  is 75% better w/ regard to edema. Will need a good standing wt.  2. Hyperkalemia =probably due to dietary indiscretion with ESRD diet counseled patient again follow-up lab data K ?6.2 today said he needs to stop eating bananas also 3. ESRD -HD schedule TTS 4. Multilobular bilateral pneumonia pneumonia IV antibiotics per admission 5. Hypertension/volume -volume up as above, is on midodrine for blood pressure control dialysis 6. Right groin hernia=per patient in the past has been turned down in Past surgical correction however significant discomfort and problems he would try and seek  second opinion while here 7. COPD tobacco abuse-states smokes a pack a day counseled about stopping 8. Anemia -Hgb 11.1> 11.9> 10.5 no ESA needs for now follow-up trend 9. Metabolic bone disease -phosphorus 9.6 needing Renvela binder compliance as outpatient continue in hospital, corrected calcium at goal, continue vitamin D on hemodialysis /increase Renvela 3 each meal currently 1 10. History of coronary disease seen by cardiology  for chest pain mostly right-sided with no further cardiac work-up recommended EKG no ischemic changes per Dr. Tamala Julian yesterday 11. Diabetes mellitus type 2 per admit 12. Nutrition -ALB 3.0 > 2.6 carb modified renal diet and protein supplements [place]  Ernest Haber, PA-C [facility] [phone] 02/22/2020,11:24 AM  LOS: 2 days   Labs: Basic Metabolic Panel: Recent Labs  Lab 02/20/20 0547 02/21/20 0921 02/22/20 0834  NA 137 135 132*  K 5.2* 5.2* 6.2*  CL 100 95* 90*  CO2 18* 25 22  GLUCOSE 180* 160* 181*  BUN 104* 68* 91*  CREATININE 8.66* 5.64* 6.75*  CALCIUM 9.1 8.6* 8.6*  PHOS  --  7.2* 9.6*   Liver Function Tests: Recent Labs  Lab 02/21/20 0921 02/22/20 0834  ALBUMIN 3.0* 2.6*   No results for input(s): LIPASE, AMYLASE in the last 168 hours. No results for input(s): AMMONIA in the last 168 hours. CBC: Recent Labs  Lab 02/19/20 2330 02/20/20 0141 02/20/20 0325 02/20/20 0325 02/20/20 0547 02/21/20 0921 02/22/20 0834  WBC 8.8   < > 11.7*   < > 12.3*  9.4 12.0*  NEUTROABS 7.5  --   --   --   --   --   --   HGB 11.0*   < > 10.7*   < > 11.1* 11.9* 10.5*  HCT 35.2*   < > 34.2*   < > 35.1* 37.1* 33.4*  MCV 97.0  --  95.8  --  93.4 94.4 93.0  PLT 201   < > 243   < > 221 203 146*   < > = values in this interval not displayed.   Cardiac Enzymes: No results for input(s): CKTOTAL, CKMB, CKMBINDEX, TROPONINI in the last 168 hours. CBG: Recent Labs  Lab 02/21/20 0626 02/21/20 1110 02/21/20 1613 02/21/20 2051  02/22/20 0616  GLUCAP 96 129* 91 113* 105*    Studies/Results: DG Chest 2 View  Result Date: 02/21/2020 CLINICAL DATA:  74 year old male with history of pulmonary edema. Shortness of breath. EXAM: CHEST - 2 VIEW COMPARISON:  Chest x-ray 02/19/2020. FINDINGS: There continues to be patchy areas of interstitial prominence and some ill-defined airspace disease scattered throughout the lungs bilaterally, however, aeration has significantly improved compared to the prior examination. Trace bilateral pleural effusions. No pneumothorax. Heart size is normal. Upper mediastinal contours are within normal limits. Aortic atherosclerosis. IMPRESSION: 1. Improving aeration in the lungs bilaterally which may reflect resolving pulmonary edema or multilobar bilateral pneumonia. 2. Trace bilateral pleural effusions. 3. Aortic atherosclerosis. Electronically Signed   By: Vinnie Langton M.D.   On: 02/21/2020 08:12   Medications: . cefTRIAXone (ROCEPHIN)  IV 2 g (02/21/20 1142)   . amiodarone  200 mg Oral Daily  . aspirin EC  81 mg Oral Daily  . atorvastatin  80 mg Oral QHS  . azithromycin  500 mg Oral Daily  . Chlorhexidine Gluconate Cloth  6 each Topical Q0600  . clopidogrel  75 mg Oral Q breakfast  . doxercalciferol      . doxercalciferol  1 mcg Intravenous Q T,Th,Sa-HD  . ezetimibe  10 mg Oral Daily  . heparin  5,000 Units Subcutaneous Q8H  . insulin aspart  0-5 Units Subcutaneous QHS  . insulin aspart  0-9 Units Subcutaneous TID WC  . methylPREDNISolone (SOLU-MEDROL) injection  40 mg Intravenous Q12H  . multivitamin  1 tablet Oral QHS  . nicotine  14 mg Transdermal Daily  . sevelamer carbonate  800 mg Oral TID WC  . sodium zirconium cyclosilicate  5 g Oral Daily

## 2020-02-23 ENCOUNTER — Telehealth: Payer: Self-pay

## 2020-02-23 DIAGNOSIS — R627 Adult failure to thrive: Secondary | ICD-10-CM

## 2020-02-23 DIAGNOSIS — Z7189 Other specified counseling: Secondary | ICD-10-CM | POA: Insufficient documentation

## 2020-02-23 DIAGNOSIS — Z515 Encounter for palliative care: Secondary | ICD-10-CM

## 2020-02-23 LAB — GLUCOSE, CAPILLARY
Glucose-Capillary: 112 mg/dL — ABNORMAL HIGH (ref 70–99)
Glucose-Capillary: 122 mg/dL — ABNORMAL HIGH (ref 70–99)
Glucose-Capillary: 122 mg/dL — ABNORMAL HIGH (ref 70–99)
Glucose-Capillary: 128 mg/dL — ABNORMAL HIGH (ref 70–99)
Glucose-Capillary: 164 mg/dL — ABNORMAL HIGH (ref 70–99)

## 2020-02-23 LAB — RENAL FUNCTION PANEL
Albumin: 2.6 g/dL — ABNORMAL LOW (ref 3.5–5.0)
Anion gap: 16 — ABNORMAL HIGH (ref 5–15)
BUN: 64 mg/dL — ABNORMAL HIGH (ref 8–23)
CO2: 28 mmol/L (ref 22–32)
Calcium: 8.9 mg/dL (ref 8.9–10.3)
Chloride: 95 mmol/L — ABNORMAL LOW (ref 98–111)
Creatinine, Ser: 4.92 mg/dL — ABNORMAL HIGH (ref 0.61–1.24)
GFR calc Af Amer: 12 mL/min — ABNORMAL LOW (ref >60)
GFR calc non Af Amer: 11 mL/min — ABNORMAL LOW (ref >60)
Glucose, Bld: 115 mg/dL — ABNORMAL HIGH (ref 70–99)
Phosphorus: 8.3 mg/dL — ABNORMAL HIGH (ref 2.5–4.6)
Potassium: 5.4 mmol/L — ABNORMAL HIGH (ref 3.5–5.1)
Sodium: 139 mmol/L (ref 135–145)

## 2020-02-23 MED ORDER — DOXYCYCLINE HYCLATE 100 MG PO TABS
100.0000 mg | ORAL_TABLET | Freq: Two times a day (BID) | ORAL | Status: DC
  Administered 2020-02-24: 100 mg via ORAL
  Filled 2020-02-23: qty 1

## 2020-02-23 MED ORDER — HYDROMORPHONE HCL 1 MG/ML IJ SOLN: 0.5000 mg | INTRAMUSCULAR | Status: DC | PRN

## 2020-02-23 MED ORDER — SODIUM ZIRCONIUM CYCLOSILICATE 10 G PO PACK
10.0000 g | PACK | Freq: Two times a day (BID) | ORAL | Status: DC
  Administered 2020-02-23 – 2020-02-24 (×2): 10 g via ORAL
  Filled 2020-02-23 (×2): qty 1

## 2020-02-23 MED ORDER — CHLORHEXIDINE GLUCONATE CLOTH 2 % EX PADS
6.0000 | MEDICATED_PAD | Freq: Every day | CUTANEOUS | Status: DC
  Administered 2020-02-23 – 2020-02-24 (×2): 6 via TOPICAL

## 2020-02-23 MED ORDER — DICLOFENAC SODIUM 1 % EX GEL
2.0000 g | Freq: Four times a day (QID) | CUTANEOUS | Status: DC
  Administered 2020-02-23 – 2020-02-24 (×4): 2 g via TOPICAL
  Filled 2020-02-23: qty 100

## 2020-02-23 NOTE — Evaluation (Signed)
Physical Therapy Evaluation Patient Details Name: [name]. MRN: [id number] DOB: 08-Oct-1945 Today's Date: 02/23/2020   History of Present Illness  The pt is a 74 yo male presenting with generalized weakness and SOB with exertion. Upon workup, pt found to be hyperkalemic, and chest X-ray showed diffuse pulmonary edema. PMH includes: CAD s/p BMS to RCA, PVD with occluded abdominal aorta s/p ax-fem bypass, CAD, ESRD on HT, prior DVT, afit on [place], COPD, HTN, HLD, DM II, and CHF.    Clinical Impression  Pt in bed upon arrival of PT, agreeable to evaluation at this time. Prior to admission the pt reports he was mobilizing with a hover round scooter both inside his home and in community (ramped entrance), and was able to transfer to the scooter with use of a RW independently. The pt reports his step-daughter assists with errands, groceries, and meals as needed, but that he is otherwise independent with ADLs, despite often requiring extra time. The pt now presents with limitations in functional mobility, strength, endurance, and activity tolerance due to above dx and chronic conditions with reduced activity, and will continue to benefit from skilled PT to address these deficits. The pt would continue to benefit from acute PT as well as HHPT to initiate program for strengthening and balance, to maintain strength while admitted, and reduce regression of mobility following d/c. However, the pt made it very clear during today's session that he does not want acute PT during this hospital stay, and only wants to initiate therapy following d/c home.      Follow Up Recommendations Home health PT;Supervision for mobility/OOB    Equipment Recommendations   (pt has needed equipment)    Recommendations for Other Services       Precautions / Restrictions Precautions Precautions: Fall Precaution Comments: unable to get SpO2 reading, but increased WOB. Restrictions Weight Bearing Restrictions: No       Mobility  Bed Mobility Overal bed mobility: Modified Independent             General bed mobility comments: increased time and effort, use of bed rails  Transfers Overall transfer level: Modified independent Equipment used: Rolling walker (2 wheeled)             General transfer comment: pt requires UE support, but is able to power up from low bed without assist or cues, no LOB, able to reach for AD without assist  Ambulation/Gait Ambulation/Gait assistance:  (pt declined)           General Gait Details: pt declined, stating he is limited by hernia dropping, and pain while in hospital     Balance Overall balance assessment: Mild deficits observed, not formally tested                                           Pertinent Vitals/Pain Pain Assessment: Faces Faces Pain Scale: Hurts little more Pain Location: pt reports severe generalized pain everywhere, most at hernia Pain Descriptors / Indicators: Sore Pain Intervention(s): Limited activity within patient's tolerance;Monitored during session;Premedicated before session;Repositioned    Home Living Family/patient expects to be discharged to:: Private residence Living Arrangements: Alone Available Help at Discharge: Family;Available PRN/intermittently (step daughter and grandson) Type of Home: House Home Access: Ramped entrance     Home Layout: One level Home Equipment: Cane - single point;Grab bars - tub/shower;Walker - 2 wheels;Shower seat;Walker - 4 wheels;Bedside  commode;Cane - quad;Grab bars - toilet;Wheelchair - manual Additional Comments: hover round scooter; uses tub shower, sits on chair    Prior Function           Comments: reports he cannot stand long due to his hernia and that it will dislodge if he stands too long. Uses SpO2 during dialysis. Step daughter and grandson help with meds, meals, and getting to/from HD     Hand Dominance   Dominant Hand: Right     Extremity/Trunk Assessment   Upper Extremity Assessment Upper Extremity Assessment: Generalized weakness;RUE deficits/detail;LUE deficits/detail RUE Deficits / Details: unable to flex or abduct at shoulder, biceps/triceps against light resistance only. LUE Deficits / Details: able to flex to 90 deg, limited endurance in biceps/triceps    Lower Extremity Assessment Lower Extremity Assessment: Generalized weakness    Cervical / Trunk Assessment Cervical / Trunk Assessment: Kyphotic  Communication   Communication: HOH  Cognition Arousal/Alertness: Awake/alert Behavior During Therapy: Agitated Overall Cognitive Status: Impaired/Different from baseline Area of Impairment: Safety/judgement                         Safety/Judgement: Decreased awareness of safety;Decreased awareness of deficits     General Comments: Pt very HOH and with increased frustration and agitation when he is unable to hear/understand. hyperverbose in detail of PMH and hospital experience, but adamant in not wanting acute PT despite also voicing frustration about not mobilizing during last admission.      General Comments General comments (skin integrity, edema, etc.): Pt gave detailed history of previous admission, very clear that he does not want acute PT while admitted due to complexity of his illness and pain despite significant attempts to explain role of acute PT.    Exercises     Assessment/Plan    PT Assessment Patient needs continued PT services  PT Problem List Decreased strength;Decreased mobility;Decreased safety awareness;Decreased range of motion;Decreased activity tolerance;Decreased balance;Pain       PT Treatment Interventions DME instruction;Gait training;Functional mobility training;Therapeutic exercise;Balance training;Patient/family education    PT Goals (Current goals can be found in the Care Plan section)  Acute Rehab PT Goals Patient Stated Goal: improve quality of  life PT Goal Formulation: With patient Time For Goal Achievement: 03/08/20 Potential to Achieve Goals: Fair    Frequency Min 3X/week    AM-PAC PT "6 Clicks" Mobility  Outcome Measure Help needed turning from your back to your side while in a flat bed without using bedrails?: None Help needed moving from lying on your back to sitting on the side of a flat bed without using bedrails?: None Help needed moving to and from a bed to a chair (including a wheelchair)?: None Help needed standing up from a chair using your arms (e.g., wheelchair or bedside chair)?: None Help needed to walk in hospital room?: A Little Help needed climbing 3-5 steps with a railing? : A Little 6 Click Score: 22    End of Session Equipment Utilized During Treatment: Oxygen Activity Tolerance: Patient limited by fatigue;Patient limited by pain Patient left: in bed;with call bell/phone within reach (sitting EOB) Nurse Communication: Mobility status PT Visit Diagnosis: Unsteadiness on feet (R26.81);Muscle weakness (generalized) (M62.81);Difficulty in walking, not elsewhere classified (R26.2);Pain Pain - part of body:  (generalized)    Time: 1027-2536 PT Time Calculation (min) (ACUTE ONLY): 40 min   Charges:   PT Evaluation $PT Eval Low Complexity: 1 Low PT Treatments $Therapeutic Activity: 8-22 mins $Self Care/Home Management: 8-22  Hardie Pulley, DPT   Acute Rehabilitation Department Pager #: [phone]  Otho Bellows 02/23/2020, 10:46 AM

## 2020-02-23 NOTE — TOC Initial Note (Signed)
Transition of Care (TOC) - Initial/Assessment Note    Patient Details  Name: [name]. MRN: [id number] Date of Birth: 02/07/1946  Transition of Care [facility]) CM/SW Contact:    Alexander Mt, LCSW Phone Number: 02/23/2020, 2:59 PM  Clinical Narrative:                 CSW spoke with pt at bedside. Introduced self, role, reason for visit. Pt from home with his two [place] who he adores, he has a step daughter [name] who he states assists him but doesn't stay with him 24/7. Pt declines any rehab placement at this time. Pt has had home therapy in the past- he proceeds to tell this Probation officer about his multiple medical issues that have made therapy hard in the past. He finally agrees to home therapy and that I may call his step daughter [name].   Was able to reach [place] at [phone] (house phone) she confirms the above details. She is also agreeable to above recs and plan. She is available as needed and confirms she takes him to dialysis. She states pt has taken home health previously. Agreeable to referral being made to [facility]. They can take him for HHPT and OT, all DME is already at pt home.  Appointment made for pt at PCP office for Sept 13th at 12:00pm.    Expected Discharge Plan: [place] Barriers to Discharge: Continued Medical Work up   Patient Goals and CMS Choice Patient states their goals for this hospitalization and ongoing recovery are:: get home to his dogs CMS Medicare.gov Compare Post Acute Care list provided to:: Other (Comment Required) (pt previously active w/ [facility], req by family for referral there first) Choice offered to / list presented to : Patient, Adult Children  Expected Discharge Plan and Services Expected Discharge Plan: [place] In-house Referral: Clinical Social Work Discharge Planning Services: CM Consult, Follow-up appt scheduled Post Acute Care Choice: [place]  arrangements for the past 2 months: [place]: [facility]) Date [place]: 02/23/20 Time [place]: [place] Representative spoke with at [place]: Joen Laura  Prior Living Arrangements/Services Living arrangements for the past 2 months: [place] with:: Self, Pets Patient language and need for interpreter reviewed:: Yes (no needs) Do you feel safe going back to the place where you live?: Yes      Need for Family Participation in Patient Care: Yes (Comment) (assistance as needed w/ daily cares) Care giver support system in place?: Yes (comment) (pt step daughter) Current home services: DME Criminal Activity/Legal Involvement Pertinent to Current Situation/Hospitalization: No - Comment as needed  Activities of Daily Living Home Assistive Devices/Equipment: Other (Comment) (hovaround) ADL Screening (condition at time of admission) Patient's cognitive ability adequate to safely complete daily activities?: Yes Is the patient deaf or have difficulty hearing?: Yes Does the patient have difficulty seeing, even when wearing glasses/contacts?: No Does the patient have difficulty concentrating, remembering, or making decisions?: No Patient able to express need for assistance with ADLs?: Yes Does the patient have difficulty dressing or bathing?: Yes Independently performs ADLs?: No Communication: Independent Dressing (OT): Needs assistance Is this a change from baseline?: Pre-admission baseline Grooming: Needs assistance Is this a change from baseline?: Pre-admission baseline Feeding: Independent Bathing: Needs assistance Is this a change from baseline?: Pre-admission baseline Toileting: Needs assistance Is this a change from baseline?:  Pre-admission baseline In/Out Bed: Independent Walks in Home: Dependent Is this a change from baseline?: Pre-admission baseline Does the patient have difficulty  walking or climbing stairs?: No Weakness of Legs: Both Weakness of Arms/Hands: Both  Permission Sought/Granted Permission sought to share information with : Family Supports, PCP Permission granted to share information with : Yes, Verbal Permission Granted  Share Information with NAME: [name]  Permission granted to share info w AGENCY: [facility] [employer] granted to share info w Relationship: step daughter  Permission granted to share info w Contact Information: [phone]  Emotional Assessment Appearance:: Appears stated age Attitude/Demeanor/Rapport: Self-Absorbed, Engaged Affect (typically observed): Overwhelmed, Frustrated, Restless Orientation: : Oriented to Self, Oriented to Place, Oriented to  Time, Oriented to Situation Alcohol / Substance Use: Not Applicable Psych Involvement: No (comment)  Admission diagnosis:  COPD (chronic obstructive pulmonary disease) ([name]) [J44.9] Hyperkalemia [E87.5] Other hypervolemia [E87.79] Patient Active Problem List   Diagnosis Date Noted  . Adult failure to thrive   . Palliative care by specialist   . DNR (do not resuscitate) discussion   . Hyperkalemia 02/20/2020  . Acute pulmonary edema ([place]) 10/10/2019  . Steal syndrome dialysis vascular access ([place]) 10/10/2019  . Hypotension 10/09/2019  . SOB (shortness of breath)   . Non-ST elevation (NSTEMI) myocardial infarction ([place])   . Volume overload 07/16/2019  . Reducible right inguinal hernia 07/16/2019  . Acute on chronic respiratory failure with hypoxia ([place]) 07/16/2019  . Constipation 05/31/2019  . Plantar fasciitis, bilateral 03/15/2019  . Atrial fibrillation ([place])   . Normocytic anemia 11/23/2018  . Hemoptysis   . ILD (interstitial lung disease) ([place])   . GIB (gastrointestinal bleeding) 11/05/2018  . Symptomatic anemia 11/04/2018  . DVT (deep venous thrombosis) ([place]) 10/26/2018  . GERD (gastroesophageal reflux disease) 10/26/2018  . ESRD on dialysis ([place])  10/26/2018  . Pancytopenia ([place]) 10/26/2018  . Elevated troponin 10/26/2018  . Hypomagnesemia 10/26/2018  . Uremia 10/08/2018  . History of bladder cancer 10/06/2018  . Type II diabetes mellitus with renal manifestations ([place]) 10/06/2018  . CAD (coronary artery disease) 10/06/2018  . ATN (acute tubular necrosis) ([place]) 10/05/2018  . 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 Thoracic disc disease with myelopathy   . Allergic reaction 02/21/2018  . Perennial and seasonal allergic rhinitis 02/21/2018  . Angioedema 02/21/2018  . COPD (chronic obstructive pulmonary disease) ([place]) 02/21/2018  . Bradycardia 01/27/2011  . Carotid artery stenosis 10/31/2009  . ERECTILE DYSFUNCTION, ORGANIC 01/24/2009  . Hyperlipidemia 10/08/2008  . TOBACCO ABUSE 10/08/2008  . HYPERTENSION, BENIGN 10/08/2008  . CAD, NATIVE VESSEL 10/08/2008  . PVD 10/08/2008  . BRUIT 10/08/2008  . Essential hypertension 10/05/2008  . Chronic back pain 10/05/2008   PCP:  [name], MD Pharmacy:   [id number] - [place], [place] [address] [place] [address] Phone: [phone] Fax: [phone]  Readmission Risk Interventions Readmission Risk Prevention Plan 02/23/2020 09/07/2019 10/20/2018  Transportation Screening Complete Complete Complete  Medication Review (RN Care Manager) Referral to Pharmacy Complete Complete  PCP or Specialist appointment within 3-5 days of discharge Complete Complete Not Complete  PCP/Specialist Appt Not Complete comments - - office to call and schedule  HRI or Home Care Consult Complete Complete Complete  SW Recovery Care/Counseling Consult Complete Complete Complete  Palliative Care Screening Complete Not Applicable Not Applicable  Skilled Nursing Facility Complete Not Applicable Not Applicable  Some recent data might  be hidden

## 2020-02-23 NOTE — Progress Notes (Signed)
Progress Note    [name].  IEP:[id number] DOB: [birth year]  DOA: 02/19/2020 PCP: [name], MD    Brief Narrative:     Medical records reviewed and are as summarized below:  [name]. is an 74 y.o. male with medical history significant for end-stage renal disease, COPD, atrial fibrillation, hypertension, CAD, diabetes mellitus type 2, bladder cancer, chronic back pain, osteoarthritis who presents to the emergency room with complaint of not feeling well for the last few days.  Reports he had dialysis on Saturday but does not think they took enough fluid off at that time.  He has continued to have generalized weakness with shortness of breath worsened by exertion for the last few days.    Assessment/Plan:   Principal Problem:   Hyperkalemia Active Problems:   Essential hypertension   COPD (chronic obstructive pulmonary disease) ([name])   Type II diabetes mellitus with renal manifestations ([name])   CAD (coronary artery disease)   ESRD on dialysis [facility])   Adult failure to thrive   Palliative care by specialist   DNR (do not resuscitate) discussion   Volume overload in setting of HD -improved with HD-- due again in the AM -x ray shows ? Volume vs PNA -will start abx as he has WBC count for ? COPD/PNA  Chest pain -troponin elevated but patient is on HD -missed dose of plavix yesterday -stent in 2020 -cardiology consult appreciated -pain improved with abx  Hyperkalemia -s/p HD -still elevated, may need another HD Session -encouraged compliance with diet    ESRD on dialysis [facility]) -nephrology consult appreciated    Essential hypertension Continue home antihypertensive medications    COPD (chronic obstructive pulmonary disease) ([place]) DuoNebs every 6 hours needed for cough shortness of breath, wheeze -was having wheezing- IV steroids wean to PO steroids- much improved    Type II diabetes mellitus with renal manifestations ([place]) Monitor blood  sugars before every meal nightly and provide sliding scale insulin as needed for glycemic control -monitor closely while on steorids    CAD (coronary artery disease) -see above, missed dose of plavix  Outpatient palliative care referral   Family Communication/Anticipated D/C date and plan/Code Status   DVT prophylaxis: Lovenox ordered. Code Status: Full Code.  Disposition Plan: Status is: Inpatient  Remains inpatient appropriate because:Inpatient level of care appropriate due to severity of illness   Dispo: The patient is from: Home              Anticipated d/c is to: Home              Anticipated d/c date is: 2 days              Patient currently is not medically stable to d/c.         Medical Consultants:    Palliative care  Renal  cards  Subjective:   Had BM this AM  Objective:    Vitals:   02/22/20 1708 02/23/20 0044 02/23/20 0554 02/23/20 1215  BP: 135/63 (!) 154/62 (!) 151/77 (!) 150/70  Pulse: 82 87 79 87  Resp: 20 16 16 16   Temp: 98.9 F (37.2 C) 98.3 F (36.8 C) 97.7 F (36.5 C) 97.6 F (36.4 C)  TempSrc: Oral Oral Oral Oral  SpO2: 97% 99% 100% 94%  Weight:        Intake/Output Summary (Last 24 hours) at 02/23/2020 1548 Last data filed at 02/22/2020 2251 Gross per 24 hour  Intake 240 ml  Output --  Net 240 ml   Filed Weights   02/21/20 1539 02/22/20 0810 02/22/20 1216  Weight: 66.4 kg 67.5 kg 63.5 kg    Exam:   General: Appearance:    Chronically ill appearing male in no acute distress     Lungs:     On [place] respirations unlabored  Heart:    Normal heart rate.    MS:   All extremities are intact.   Neurologic:   Awake, alert    Data Reviewed:   I have personally reviewed following labs and imaging studies:  Labs: Labs show the following:   Basic Metabolic Panel: Recent Labs  Lab 02/19/20 2330 02/20/20 0136 02/20/20 0141 02/20/20 0141 02/20/20 0547 02/20/20 0547 02/21/20 0921 02/21/20 0921 02/22/20 0834  02/23/20 0636  NA 138   < > 137  --  137  --  135  --  132* 139  K 6.1*   < > 6.1*   < > 5.2*   < > 5.2*   < > 6.2* 5.4*  CL 104   < > 106  --  100  --  95*  --  90* 95*  CO2 19*  --   --   --  18*  --  25  --  22 28  GLUCOSE 152*   < > 138*  --  180*  --  160*  --  181* 115*  BUN 99*   < > 122*  --  104*  --  68*  --  91* 64*  CREATININE 7.65*   < > 8.60*  --  8.66*  --  5.64*  --  6.75* 4.92*  CALCIUM 8.0*  --   --   --  9.1  --  8.6*  --  8.6* 8.9  PHOS  --   --   --   --   --   --  7.2*  --  9.6* 8.3*   < > = values in this interval not displayed.   GFR Estimated Creatinine Clearance: 11.7 mL/min (A) (by C-G formula based on SCr of 4.92 mg/dL (H)). Liver Function Tests: Recent Labs  Lab 02/21/20 0921 02/22/20 0834 02/23/20 0636  ALBUMIN 3.0* 2.6* 2.6*   No results for input(s): LIPASE, AMYLASE in the last 168 hours. No results for input(s): AMMONIA in the last 168 hours. Coagulation profile No results for input(s): INR, PROTIME in the last 168 hours.  CBC: Recent Labs  Lab 02/19/20 2330 02/19/20 2330 02/20/20 0141 02/20/20 0325 02/20/20 0547 02/21/20 0921 02/22/20 0834  WBC 8.8  --   --  11.7* 12.3* 9.4 12.0*  NEUTROABS 7.5  --   --   --   --   --   --   HGB 11.0*   < > 10.9* 10.7* 11.1* 11.9* 10.5*  HCT 35.2*   < > 32.0* 34.2* 35.1* 37.1* 33.4*  MCV 97.0  --   --  95.8 93.4 94.4 93.0  PLT 201  --   --  243 221 203 146*   < > = values in this interval not displayed.   Cardiac Enzymes: No results for input(s): CKTOTAL, CKMB, CKMBINDEX, TROPONINI in the last 168 hours. BNP (last 3 results) No results for input(s): PROBNP in the last 8760 hours. CBG: Recent Labs  Lab 02/22/20 1623 02/22/20 2105 02/23/20 0042 02/23/20 0554 02/23/20 1112  GLUCAP 198* 173* 128* 122* 122*   D-Dimer: No results for input(s): DDIMER in the last 72 hours. Hgb  A1c: No results for input(s): HGBA1C in the last 72 hours. Lipid Profile: No results for input(s): CHOL, HDL,  LDLCALC, TRIG, CHOLHDL, LDLDIRECT in the last 72 hours. Thyroid function studies: No results for input(s): TSH, T4TOTAL, T3FREE, THYROIDAB in the last 72 hours.  Invalid input(s): FREET3 Anemia work up: No results for input(s): VITAMINB12, FOLATE, FERRITIN, TIBC, IRON, RETICCTPCT in the last 72 hours. Sepsis Labs: Recent Labs  Lab 02/20/20 0325 02/20/20 0547 02/21/20 0921 02/22/20 0834  WBC 11.7* 12.3* 9.4 12.0*    Microbiology Recent Results (from the past 240 hour(s))  SARS Coronavirus 2 by RT PCR (hospital order, performed in [facility] hospital lab) Nasopharyngeal Nasopharyngeal Swab     Status: None   Collection Time: 02/19/20 11:53 PM   Specimen: Nasopharyngeal Swab  Result Value Ref Range Status   SARS Coronavirus 2 NEGATIVE NEGATIVE Final    Comment: (NOTE) SARS-CoV-2 target nucleic acids are NOT DETECTED.  The SARS-CoV-2 RNA is generally detectable in upper and lower respiratory specimens during the acute phase of infection. The lowest concentration of SARS-CoV-2 viral copies this assay can detect is 250 copies / mL. A negative result does not preclude SARS-CoV-2 infection and should not be used as the sole basis for treatment or other patient management decisions.  A negative result may occur with improper specimen collection / handling, submission of specimen other than nasopharyngeal swab, presence of viral mutation(s) within the areas targeted by this assay, and inadequate number of viral copies (<250 copies / mL). A negative result must be combined with clinical observations, patient history, and epidemiological information.  Fact Sheet for Patients:   StrictlyIdeas.no  Fact Sheet for Healthcare Providers: BankingDealers.co.za  This test is not yet approved or  cleared by the [place] FDA and has been authorized for detection and/or diagnosis of SARS-CoV-2 by FDA under an Emergency Use Authorization  (EUA).  This EUA will remain in effect (meaning this test can be used) for the duration of the COVID-19 declaration under Section 564(b)(1) of the Act, 21 U.S.C. section 360bbb-3(b)(1), unless the authorization is terminated or revoked sooner.  Performed at [facility], [place] [address] [place], [place] [address]     Procedures and diagnostic studies:  No results found.  Medications:   . amiodarone  200 mg Oral Daily  . aspirin EC  81 mg Oral Daily  . atorvastatin  80 mg Oral QHS  . Chlorhexidine Gluconate Cloth  6 each Topical Q0600  . clopidogrel  75 mg Oral Q breakfast  . diclofenac Sodium  2 g Topical QID  . doxercalciferol  1 mcg Intravenous Q T,Th,Sa-HD  . [START ON 02/24/2020] doxycycline  100 mg Oral Q12H  . ezetimibe  10 mg Oral Daily  . feeding supplement (NEPRO CARB STEADY)  237 mL Oral BID BM  . heparin  5,000 Units Subcutaneous Q8H  . insulin aspart  0-5 Units Subcutaneous QHS  . insulin aspart  0-9 Units Subcutaneous TID WC  . multivitamin  1 tablet Oral QHS  . nicotine  14 mg Transdermal Daily  . predniSONE  40 mg Oral Q breakfast  . sevelamer carbonate  2,400 mg Oral TID WC  . sodium zirconium cyclosilicate  10 g Oral BID   Continuous Infusions:    LOS: 3 days   Geradine Girt  [facility]   How to contact the [facility] Attending or Consulting provider [place] or covering provider during after hours [place], for this patient?  1.  Check the care team in [facility] and look for a) attending/consulting TR[facility] provider listed and b) the [facility] team listed 2. Log into [email or web address] and use [place]'s universal password to access. If you do not have the password, please contact the hospital operator. 3. Locate the [facility] provider you are looking for under [facility] and page to a number that you can be directly reached. 4. If you still have difficulty reaching the provider, please page the [facility] (Director on Call) for the Hospitalists listed on amion for  assistance.  02/23/2020, 3:48 PM

## 2020-02-23 NOTE — Telephone Encounter (Signed)
Mr. Virgin daughter [name] called, he's been in the hospital since Monday, he wants you to remove the Alpha Gal allergy from his allergy list they want let him eat any red meat nor shell fish, but he's been eating it all along.

## 2020-02-23 NOTE — Telephone Encounter (Signed)
That is why he broke out in hives a few times 1-2 years ago.  I will remove it if he has been eating mammalian meat without reaction.

## 2020-02-23 NOTE — Progress Notes (Addendum)
Progress Note  Patient Name: [name]. Date of Encounter: 02/23/2020  [facility] Cardiologist: Dorris Carnes, MD   Subjective   No chest pain , or SOB, though without 02 this AM while in [place] was very SOB.  Now with nausea,  Complains about ing. Hernia   Inpatient Medications    Scheduled Meds: . amiodarone  200 mg Oral Daily  . aspirin EC  81 mg Oral Daily  . atorvastatin  80 mg Oral QHS  . azithromycin  500 mg Oral Daily  . Chlorhexidine Gluconate Cloth  6 each Topical Q0600  . clopidogrel  75 mg Oral Q breakfast  . doxercalciferol  1 mcg Intravenous Q T,Th,Sa-HD  . ezetimibe  10 mg Oral Daily  . feeding supplement (NEPRO CARB STEADY)  237 mL Oral BID BM  . heparin  5,000 Units Subcutaneous Q8H  . insulin aspart  0-5 Units Subcutaneous QHS  . insulin aspart  0-9 Units Subcutaneous TID WC  . multivitamin  1 tablet Oral QHS  . nicotine  14 mg Transdermal Daily  . predniSONE  40 mg Oral Q breakfast  . sevelamer carbonate  2,400 mg Oral TID WC  . sodium zirconium cyclosilicate  10 g Oral BID   Continuous Infusions: . cefTRIAXone (ROCEPHIN)  IV 2 g (02/22/20 1400)   PRN Meds: HYDROmorphone (DILAUDID) injection, ipratropium-albuterol, nicotine polacrilex, ondansetron **OR** ondansetron (ZOFRAN) IV, oxyCODONE-acetaminophen **AND** oxyCODONE, polyethylene glycol   Vital Signs    Vitals:   02/22/20 1315 02/22/20 1708 02/23/20 0044 02/23/20 0554  BP: 127/77 135/63 (!) 154/62 (!) 151/77  Pulse: 100 82 87 79  Resp: 20 20 16 16   Temp: 98.9 F (37.2 C) 98.9 F (37.2 C) 98.3 F (36.8 C) 97.7 F (36.5 C)  TempSrc: Oral Oral Oral Oral  SpO2: 98% 97% 99% 100%  Weight:        Intake/Output Summary (Last 24 hours) at 02/23/2020 0916 Last data filed at 02/22/2020 2251 Gross per 24 hour  Intake 240 ml  Output 3500 ml  Net -3260 ml   Last 3 Weights 02/22/2020 02/22/2020 02/21/2020  Weight (lbs) 139 lb 15.9 oz 148 lb 13 oz 146 lb 4.8 oz  Weight (kg) 63.5 kg 67.5 kg  66.361 kg      Telemetry    SR with PACs - Personally Reviewed  ECG    No new - Personally Reviewed  Physical Exam   GEN: No acute distress.   Neck: no JVD Cardiac: RRR, no murmurs, rubs, or gallops.  Respiratory: Clear to auscultation bilaterally. GI: Soft, nontender, non-distended  MS: No edema; No deformity. Neuro:  Nonfocal  Psych: Normal affect   Labs    High Sensitivity Troponin:   Recent Labs  Lab 02/19/20 2330 02/20/20 0136 02/21/20 0921 02/21/20 1107  TROPONINIHS 74* 80* 127* 102*      Chemistry Recent Labs  Lab 02/21/20 0921 02/22/20 0834 02/23/20 0636  NA 135 132* 139  K 5.2* 6.2* 5.4*  CL 95* 90* 95*  CO2 25 22 28   GLUCOSE 160* 181* 115*  BUN 68* 91* 64*  CREATININE 5.64* 6.75* 4.92*  CALCIUM 8.6* 8.6* 8.9  ALBUMIN 3.0* 2.6* 2.6*  GFRNONAA 9* 7* 11*  GFRAA 10* 8* 12*  ANIONGAP 15 20* 16*     Hematology Recent Labs  Lab 02/20/20 0547 02/21/20 0921 02/22/20 0834  WBC 12.3* 9.4 12.0*  RBC 3.76* 3.93* 3.59*  HGB 11.1* 11.9* 10.5*  HCT 35.1* 37.1* 33.4*  MCV 93.4 94.4 93.0  MCH 29.5 30.3 29.2  MCHC 31.6 32.1 31.4  RDW 20.8* 21.2* 20.5*  PLT 221 203 146*    BNPNo results for input(s): BNP, PROBNP in the last 168 hours.   DDimer No results for input(s): DDIMER in the last 168 hours.   Radiology    No results found.  Cardiac Studies   Cardiac cath 09/05/19   The left ventricular systolic function is normal. The left ventricular ejection fraction is 50-55% by visual estimate -apparent inferior hypokinesis  LV end diastolic pressure is moderate-severely elevated.  Mid RCA to Dist RCA lesion is 90% stenosed. (Heavily calcified focal lesion)  Prox RCA lesion is 40% stenosed. Prox RCA to Mid RCA lesion is 50% stenosed with 30% stenosed side branch in RV Branch. Mid RCA lesion is 35% stenosed.  Mid LAD lesion is 30% stenosed with 25% stenosed side branch in 2nd Diag.  SUMMARY  Distal RCA heavily calcified 90% lesion with  moderate calcified disease upstream.  Minimal LCA disease.  Moderate to severely elevated LVEDP despite systemic hypotension postdialysis.  RECOMMENDATIONS  Patient has a severe calcified lesion in the RCA needs to be treated with atherectomy followed by PCI. We will plan staged atherectomy based PCI by Dr. Fletcher Anon on 09/06/2018 1 in the afternoon. (Precath orders written) ? We will load with 300 mg Plavix tonight and set her milligrams in the morning tomorrow. -> ? Plan will be to use Plavix plus DOAC on discharge (potentially aspirin for 1 month)  Continue aggressive GDMT for CAD-already on statin. Blood pressure likely not able to tolerate beta-blocker.   Athrectomy3/03/2020:   Mid LAD lesion is 30% stenosed with 25% stenosed side branch in 2nd Diag.  Mid RCA to Dist RCA lesion is 90% stenosed.  Mid RCA lesion is 35% stenosed.  Prox RCA to Mid RCA lesion is 50% stenosed with 30% stenosed side branch in RV Branch.  Prox RCA lesion is 40% stenosed.  Post intervention, there is a 0% residual stenosis.  Post intervention, there is a 0% residual stenosis.  A drug-eluting stent was successfully placed using a STENT RESOLUTE ONYX 3.5X22.  Successful orbital atherectomy and drug-eluting stent placement to mid/distal right coronary artery.  Recommendations: Given the patient's anemia, I do not think he will tolerate resumption of anticoagulation at the present time. Recommend treatment with dual antiplatelet therapy with aspirin and clopidogrel for at least 6 months and if the patient's anemia is improved then, resuming anticoagulation can be considered with stopping aspirin. Continue aggressive treatment of risk factors.   Patient Profile     74 y.o. male with a hx of CADs/pBMS to RCA in 2007, negative Myoview stress test 2010, PVD(occluded abdominal aorta>>s/p Ax-Fembypass),carotid artery disease, ESRD on HD, prior DVT, atrial fibrillation on chronic  anticoagulation with Eliquis, COPD, hypertension, hyperlipidemia, DM2, history of bladder cancer, and diastolic CHFnow with chest pain, Rt side.    Assessment & Plan    Atypical Rt side chest pain with elevated troponin though in combination with dialysis pt  On ASA and plavix. Low suspicion for ACS  Chronic diastolic CHF/ ESRD with HD Was volume overloaded on admit but nephrology following with dialysis.   P.Atrial fib maintaining SR --on amiodarone  Anticoagulation discontinued given the need for DAPT with ASA and Plavix in the setting of NSTEMI -Plan for possible discontinuation of ASA with resumption of Eliquis after 6 months post PCIper chart review.    HTN 154/62 to 151/77  Improved from March  HLD continue statin  Tobacco use on nicoderm   Ing. Hernia with pain at times. Per IM     For questions or updates, please contact [place] Please consult [email or web address] for contact info under        Signed, Cecilie Kicks, NP  02/23/2020, 9:16 AM    PT seen and examined  I agree with findings as noted above by L Dorene Ar Denies CP   He did have a bad spell this AM of SOB   On exam,  Lungs are relatively clear  Cardiac RRR  No S3  Ext are with tr edema  Pt to have dialysis tomorrow  Dorris Carnes

## 2020-02-23 NOTE — Progress Notes (Signed)
[facility] Progress Note   Subjective:   Patient seen and examined at bedside.  Reports finally having a BM this AM after being constipated for several days and "5 glasses of miralax".  States he became SOB while in the bathroom due to being in there for 50min without O2 because the cord was not long enough to reach.  Breathing is now improved, back to baseline, as he is laying in bed and back on O2.  Denies LE edema, says that is now "gone".  Admits to n/v this AM after breakfast, and continues to feel "a little queasy" now.  Biggest complaint is not having a "hernia belt" in the hospital so every time he stands up "my hernia falls out" and causes pain. Denies CP, weakness, dizziness and fatigue.    Objective Vitals:   02/22/20 1315 02/22/20 1708 02/23/20 0044 02/23/20 0554  BP: 127/77 135/63 (!) 154/62 (!) 151/77  Pulse: 100 82 87 79  Resp: 20 20 16 16   Temp: 98.9 F (37.2 C) 98.9 F (37.2 C) 98.3 F (36.8 C) 97.7 F (36.5 C)  TempSrc: Oral Oral Oral Oral  SpO2: 98% 97% 99% 100%  Weight:       Physical Exam General:chronially ill appearing male in NAD Heart:RRR Lungs:CTAB, nml WOB on O2 via [place] Abdomen:soft, NTND, +BS Extremities:no LE edema Dialysis Access: LU AVF +b   Filed Weights   02/21/20 1539 02/22/20 0810 02/22/20 1216  Weight: 66.4 kg 67.5 kg 63.5 kg    Intake/Output Summary (Last 24 hours) at 02/23/2020 1041 Last data filed at 02/22/2020 2251 Gross per 24 hour  Intake 240 ml  Output 3500 ml  Net -3260 ml    Additional Objective Labs: Basic Metabolic Panel: Recent Labs  Lab 02/21/20 0921 02/22/20 0834 02/23/20 0636  NA 135 132* 139  K 5.2* 6.2* 5.4*  CL 95* 90* 95*  CO2 25 22 28   GLUCOSE 160* 181* 115*  BUN 68* 91* 64*  CREATININE 5.64* 6.75* 4.92*  CALCIUM 8.6* 8.6* 8.9  PHOS 7.2* 9.6* 8.3*   Liver Function Tests: Recent Labs  Lab 02/21/20 0921 02/22/20 0834 02/23/20 0636  ALBUMIN 3.0* 2.6* 2.6*   CBC: Recent Labs  Lab  02/19/20 2330 02/20/20 0141 02/20/20 0325 02/20/20 0325 02/20/20 0547 02/21/20 0921 02/22/20 0834  WBC 8.8   < > 11.7*   < > 12.3* 9.4 12.0*  NEUTROABS 7.5  --   --   --   --   --   --   HGB 11.0*   < > 10.7*   < > 11.1* 11.9* 10.5*  HCT 35.2*   < > 34.2*   < > 35.1* 37.1* 33.4*  MCV 97.0  --  95.8  --  93.4 94.4 93.0  PLT 201   < > 243   < > 221 203 146*   < > = values in this interval not displayed.   CBG: Recent Labs  Lab 02/22/20 1310 02/22/20 1623 02/22/20 2105 02/23/20 0042 02/23/20 0554  GLUCAP 136* 198* 173* 128* 122*    Medications: . cefTRIAXone (ROCEPHIN)  IV 2 g (02/23/20 0947)   . amiodarone  200 mg Oral Daily  . aspirin EC  81 mg Oral Daily  . atorvastatin  80 mg Oral QHS  . azithromycin  500 mg Oral Daily  . Chlorhexidine Gluconate Cloth  6 each Topical Q0600  . clopidogrel  75 mg Oral Q breakfast  . doxercalciferol  1 mcg Intravenous Q T,Th,Sa-HD  .  ezetimibe  10 mg Oral Daily  . feeding supplement (NEPRO CARB STEADY)  237 mL Oral BID BM  . heparin  5,000 Units Subcutaneous Q8H  . insulin aspart  0-5 Units Subcutaneous QHS  . insulin aspart  0-9 Units Subcutaneous TID WC  . multivitamin  1 tablet Oral QHS  . nicotine  14 mg Transdermal Daily  . predniSONE  40 mg Oral Q breakfast  . sevelamer carbonate  2,400 mg Oral TID WC  . sodium zirconium cyclosilicate  10 g Oral BID    Dialysis Orders: [facility] on TTS, 3 hours 45 minutes, 64.5 kg dry weight, 2K, 2.5 ca bath, left arm AV fistula, no heparin, Mircera last given 75 mcg 02/13/2020, Hectorol 1 mic q. Dialysis  Assessment/Plan: 1. Dyspnea/Pulmonary edema/Volume overload - in setting of pt with COPD and ongoing 1pack/day tobacco abuse.  CXR 8/24 improved.  Dyspnea this AM while in the bathroom without O2 but now resolved.  Does not appear volume overloaded.  Recent weight loss, 1kg under dry wt post HD, will lower dry on d/c.  May need to be titrated down further as OP.   2. Hyperkalemia -  likely due to dietary indiscretions but could also be due to blood recirculating if AVF has area of stenosis.  Last AF low, missed fistulogram scheduled 8/25, if still here on Monday will consult IR for fistulogram, if not can have it rescheduled at OP.  Counseled on diet.  K 5.4 today.  Lokelma ordered.  Will use lower K bath with HD tomorrow if remains elevated.  3. Multilobular PNA b/l - on ABX. Per primary 4. Chest pain/Hx CAD - cardiology following 5. ESRD - on HD TTS.  HD yesterday tolerated well.   6. Anemia of CKD- Hgb 10.5. ESA recently dosed.  7. Secondary hyperparathyroidism - Ca at goal. Phos elevated.  Renvela just increased to 3tab AC TID.  Continue VDRA. 8. HTN - Blood pressure mildly elevated today. On midodrine for BP support with HD.  Monitor.  9. Nutrition - Renal diet w/fluid restrictions. Protein supplements.  10. R groin hernia - medical management per pt.  11. COPD - on home O2 12. Diabetes - per primary   Jen Mow, PA-C [place] 02/23/2020,10:41 AM  LOS: 3 days

## 2020-02-24 ENCOUNTER — Inpatient Hospital Stay (HOSPITAL_COMMUNITY): Payer: Medicare HMO

## 2020-02-24 LAB — BASIC METABOLIC PANEL
Anion gap: 18 — ABNORMAL HIGH (ref 5–15)
BUN: 94 mg/dL — ABNORMAL HIGH (ref 8–23)
CO2: 23 mmol/L (ref 22–32)
Calcium: 8.9 mg/dL (ref 8.9–10.3)
Chloride: 95 mmol/L — ABNORMAL LOW (ref 98–111)
Creatinine, Ser: 6.32 mg/dL — ABNORMAL HIGH (ref 0.61–1.24)
GFR calc Af Amer: 9 mL/min — ABNORMAL LOW (ref >60)
GFR calc non Af Amer: 8 mL/min — ABNORMAL LOW (ref >60)
Glucose, Bld: 121 mg/dL — ABNORMAL HIGH (ref 70–99)
Potassium: 6.3 mmol/L (ref 3.5–5.1)
Sodium: 136 mmol/L (ref 135–145)

## 2020-02-24 LAB — CBC
HCT: 30.4 % — ABNORMAL LOW (ref 39.0–52.0)
Hemoglobin: 9.7 g/dL — ABNORMAL LOW (ref 13.0–17.0)
MCH: 29.9 pg (ref 26.0–34.0)
MCHC: 31.9 g/dL (ref 30.0–36.0)
MCV: 93.8 fL (ref 80.0–100.0)
Platelets: DECREASED 10*3/uL (ref 150–400)
RBC: 3.24 MIL/uL — ABNORMAL LOW (ref 4.22–5.81)
RDW: 19.6 % — ABNORMAL HIGH (ref 11.5–15.5)
WBC: 12.9 10*3/uL — ABNORMAL HIGH (ref 4.0–10.5)
nRBC: 0 % (ref 0.0–0.2)

## 2020-02-24 LAB — MAGNESIUM: Magnesium: 2.4 mg/dL (ref 1.7–2.4)

## 2020-02-24 LAB — GLUCOSE, CAPILLARY
Glucose-Capillary: 135 mg/dL — ABNORMAL HIGH (ref 70–99)
Glucose-Capillary: 94 mg/dL (ref 70–99)

## 2020-02-24 LAB — POTASSIUM: Potassium: 4 mmol/L (ref 3.5–5.1)

## 2020-02-24 MED ORDER — DICLOFENAC SODIUM 1 % EX GEL: 2.0000 g | Freq: Four times a day (QID) | CUTANEOUS | Status: AC

## 2020-02-24 MED ORDER — SODIUM ZIRCONIUM CYCLOSILICATE 10 G PO PACK
10.0000 g | PACK | Freq: Two times a day (BID) | ORAL | Status: DC
  Administered 2020-02-24 (×2): 10 g via ORAL
  Filled 2020-02-24 (×2): qty 1

## 2020-02-24 MED ORDER — PREDNISONE 10 MG PO TABS: ORAL_TABLET | ORAL | 0 refills | Status: DC

## 2020-02-24 MED ORDER — SODIUM ZIRCONIUM CYCLOSILICATE 10 G PO PACK: PACK | ORAL | 0 refills | Status: DC

## 2020-02-24 MED ORDER — NICOTINE 14 MG/24HR TD PT24
14.0000 mg | MEDICATED_PATCH | Freq: Every day | TRANSDERMAL | 0 refills | Status: DC
Start: 2020-02-25 — End: 2020-03-22

## 2020-02-24 NOTE — Progress Notes (Signed)
CRITICAL VALUE ALERT  Critical Value: potasium 6.3  Date & Time Notied:  02/24/20 0340   Provider Notified: Dr Myna Hidalgo  Orders Received/Actions taken: text paged

## 2020-02-24 NOTE — Progress Notes (Signed)
Pt IV removed by NT, catheter intact and telemetry removed  Pt discharge education provided at bedside, all questions answered  Pt daughter [name] notified, aware of discharge Pt has all belongings  Pt discharged via wheelchair with NT

## 2020-02-24 NOTE — Progress Notes (Signed)
Patient complains of bilateral arm stiffness , right greater than left. Patient denies numbness and tingling at this time. Dr Myna Hidalgo text paged.

## 2020-02-24 NOTE — Progress Notes (Signed)
Cardiology Progress Note  Patient ID: [name]. MRN: [id number] DOB: 07-10-1945 Date of Encounter: 02/24/2020  Primary Cardiologist: Dorris Carnes, MD  Subjective   Chief Complaint: Reports he feels better.  HPI: On dialysis this morning.  Reports he feels better.  Hyperkalemia today.  ROS:  All other ROS reviewed and negative. Pertinent positives noted in the HPI.     Inpatient Medications  Scheduled Meds: . amiodarone  200 mg Oral Daily  . aspirin EC  81 mg Oral Daily  . atorvastatin  80 mg Oral QHS  . Chlorhexidine Gluconate Cloth  6 each Topical Q0600  . clopidogrel  75 mg Oral Q breakfast  . diclofenac Sodium  2 g Topical QID  . doxercalciferol  1 mcg Intravenous Q T,Th,Sa-HD  . doxycycline  100 mg Oral Q12H  . ezetimibe  10 mg Oral Daily  . feeding supplement (NEPRO CARB STEADY)  237 mL Oral BID BM  . heparin  5,000 Units Subcutaneous Q8H  . insulin aspart  0-5 Units Subcutaneous QHS  . insulin aspart  0-9 Units Subcutaneous TID WC  . multivitamin  1 tablet Oral QHS  . nicotine  14 mg Transdermal Daily  . predniSONE  40 mg Oral Q breakfast  . sevelamer carbonate  2,400 mg Oral TID WC  . sodium zirconium cyclosilicate  10 g Oral BID   Continuous Infusions:  PRN Meds: HYDROmorphone (DILAUDID) injection, ipratropium-albuterol, nicotine polacrilex, ondansetron **OR** ondansetron (ZOFRAN) IV, oxyCODONE-acetaminophen **AND** oxyCODONE, polyethylene glycol   Vital Signs   Vitals:   02/24/20 0830 02/24/20 0900 02/24/20 0930 02/24/20 1000  BP: (!) 151/73 (!) 142/67 (!) 143/50 (!) 141/65  Pulse: 81 74 68 90  Resp:      Temp:      TempSrc:      SpO2:      Weight:       No intake or output data in the 24 hours ending 02/24/20 1014 Last 3 Weights 02/24/2020 02/22/2020 02/22/2020  Weight (lbs) 144 lb 2.9 oz 139 lb 15.9 oz 148 lb 13 oz  Weight (kg) 65.4 kg 63.5 kg 67.5 kg      Telemetry  Overnight telemetry shows sinus rhythm, which I personally reviewed.    Physical Exam   Vitals:   02/24/20 0830 02/24/20 0900 02/24/20 0930 02/24/20 1000  BP: (!) 151/73 (!) 142/67 (!) 143/50 (!) 141/65  Pulse: 81 74 68 90  Resp:      Temp:      TempSrc:      SpO2:      Weight:       No intake or output data in the 24 hours ending 02/24/20 1014  Last 3 Weights 02/24/2020 02/22/2020 02/22/2020  Weight (lbs) 144 lb 2.9 oz 139 lb 15.9 oz 148 lb 13 oz  Weight (kg) 65.4 kg 63.5 kg 67.5 kg    Body mass index is 20.69 kg/m.  General: Well nourished, well developed, in no acute distress Head: Atraumatic, normal size  Eyes: PEERLA, EOMI  Neck: Supple, no JVD Endocrine: No thryomegaly Cardiac: Normal S1, S2; RRR; no murmurs, rubs, or gallops Lungs: Clear to auscultation bilaterally, no wheezing, rhonchi or rales  Abd: Soft, nontender, no hepatomegaly  Ext: No edema, pulses 2+ Musculoskeletal: No deformities, BUE and BLE strength normal and equal Skin: Warm and dry, no rashes   Neuro: Alert and oriented to person, place, time, and situation, CNII-XII grossly intact, no focal deficits  Psych: Normal mood and affect   Labs  High  Sensitivity Troponin:   Recent Labs  Lab 02/19/20 2330 02/20/20 0136 02/21/20 0921 02/21/20 1107  TROPONINIHS 74* 80* 127* 102*     Cardiac EnzymesNo results for input(s): TROPONINI in the last 168 hours. No results for input(s): TROPIPOC in the last 168 hours.  Chemistry Recent Labs  Lab 02/21/20 [phone] 02/21/20 0921 02/22/20 0834 02/23/20 0636 02/24/20 0235  NA 135   < > 132* 139 136  K 5.2*   < > 6.2* 5.4* 6.3*  CL 95*   < > 90* 95* 95*  CO2 25   < > 22 28 23   GLUCOSE 160*   < > 181* 115* 121*  BUN 68*   < > 91* 64* 94*  CREATININE 5.64*   < > 6.75* 4.92* 6.32*  CALCIUM 8.6*   < > 8.6* 8.9 8.9  ALBUMIN 3.0*  --  2.6* 2.6*  --   GFRNONAA 9*   < > 7* 11* 8*  GFRAA 10*   < > 8* 12* 9*  ANIONGAP 15   < > 20* 16* 18*   < > = values in this interval not displayed.    Hematology Recent Labs  Lab 02/21/20 0921  02/22/20 0834 02/24/20 0750  WBC 9.4 12.0* 12.9*  RBC 3.93* 3.59* 3.24*  HGB 11.9* 10.5* 9.7*  HCT 37.1* 33.4* 30.4*  MCV 94.4 93.0 93.8  MCH 30.3 29.2 29.9  MCHC 32.1 31.4 31.9  RDW 21.2* 20.5* 19.6*  PLT 203 146* PLATELET CLUMPS NOTED ON SMEAR, COUNT APPEARS DECREASED   BNPNo results for input(s): BNP, PROBNP in the last 168 hours.  DDimer No results for input(s): DDIMER in the last 168 hours.   Radiology  DG CHEST PORT 1 VIEW  Result Date: 02/24/2020 CLINICAL DATA:  SOB in IP. Hx of PAD, COPD, CAD, A-fib, CHF, diabetes, HTN, ILD. EXAM: PORTABLE CHEST 1 VIEW COMPARISON:  Chest radiograph 02/21/2020 FINDINGS: Stable cardiomediastinal contours given patient rotation. Persistent diffuse bilateral interstitial opacities and ill-defined airspace disease particularly in the right peripheral lung, possibly slightly increased from prior. Probable small right effusion. No acute finding in the visualized skeleton. IMPRESSION: Redemonstrated bilateral interstitial and airspace disease, slightly increased in the right peripheral lung. Electronically Signed   By: Audie Pinto M.D.   On: 02/24/2020 09:37    Cardiac Studies   Left heart cath 09/05/2019 -90% RCA lesion status post atherectomy and PCI -30% LAD  Patient Profile  [name]. is a 74 y.o. male with CAD status post PCI to the RCA in March 2021, ESRD on hemodialysis, PAD status post aortobifem bypass, atrial fibrillation, COPD admitted on 02/19/2020 with weakness and fatigue and shortness of breath after hemodialysis.  Assessment & Plan   1.  Chest pain/elevated troponin -Right-sided chest pain.  EKG normal sinus rhythm without acute ischemic changes.  Troponin is mildly elevated.  Overall this is not an acute coronary syndrome.  Suspect troponin was elevated in the setting of metabolic derangement of for hemodialysis.  Also had hyperkalemia. -No further cardiac evaluation.  2.  CAD status post PCI to RCA 08/2019 -Recent  atherectomy and PCI to the RCA. -Continue aspirin Plavix.  Apparently he will start Eliquis back after 6 months of DAPT.  We will leave this up to his primary cardiologist.  3.  Diastolic heart failure -Volume management per nephrology.  4.  Paroxysmal atrial fibrillation -In sinus rhythm.  Continue amiodarone.  Plans to restart anticoagulation after 6 months of DAPT.  This plan is deferred to  the primary cardiologist as this plan was made before my involvement in his care.  [facility] will sign off.   Medication Recommendations: Continue aspirin and Plavix.  Continue home medications. Other recommendations (labs, testing, etc): None Follow up as an outpatient: We will arrange a hospital follow-up in 2 to 4 weeks with Dr. Harrington Challenger.  For questions or updates, please contact [place] Please consult [email or web address] for contact info under   Time Spent with Patient: I have spent a total of 15 minutes with patient reviewing hospital notes, telemetry, EKGs, labs and examining the patient as well as establishing an assessment and plan that was discussed with the patient.  > 50% of time was spent in direct patient care.    Signed, Addison Naegeli. Audie Box, [place]  02/24/2020 10:14 AM

## 2020-02-24 NOTE — Discharge Summary (Addendum)
Physician Discharge Summary  [name]. ZJQ:[id number] DOB: Dec 31, 1945 DOA: 02/19/2020  PCP: [name] Frizzle, MD  Admit date: 02/19/2020 Discharge date: 02/24/2020  Admitted From: home Discharge disposition: home   Recommendations for Outpatient Follow-Up:   1. Home health 2. Outpatient palliative care referral- defer to PCP 3. Encourage renal diet 4. Lower dry weight for HD   Discharge Diagnosis:   Principal Problem:   Hyperkalemia Active Problems:   Essential hypertension   COPD (chronic obstructive pulmonary disease) ([name])   Type II diabetes mellitus with renal manifestations ([name])   CAD (coronary artery disease)   ESRD on dialysis [facility])   Adult failure to thrive   Palliative care by specialist    Discharge Condition: Improved.  Diet recommendation: renal  Wound care: None.  Code status: Full.   History of Present Illness:  [name] Acklin. is a 74 y.o. male with medical history significant for end-stage renal disease, COPD, atrial fibrillation, hypertension, CAD, diabetes mellitus type 2, bladder cancer, chronic back pain, osteoarthritis who presents to the emergency room with complaint of not feeling well for the last few days.  Reports he had dialysis on Saturday but does not think they took enough fluid off at that time.  He has continued to have generalized weakness with shortness of breath worsened by exertion for the last few days.  He denies any syncope or falls.  States he has not had any fever.  He has a chronic nonproductive cough which is unchanged.  He denies any chest pain, pressure or palpitations.  He denies any nausea, vomiting, diarrhea.  He has no change in appetite.  States he has woken up feeling very warm at night few times but does not have night sweats.    Hospital Course by Problem:   Acute on chronic respiratory failure due Volume overload in setting of HD plus COPD/PNA -improved with HD-- down around 9 L -x ray shows ?  Volume vs PNA -treated with  abx as he had WBC count   Chest pain -troponin elevated but patient is on HD -missed dose of plavix yesterday -stent in 2020 -cardiology consult appreciated- outpatient follow up arranged -pain improved with abx  Hyperkalemia -lokelma on days he does not go to HD -encouraged compliance with diet  ESRD on dialysis [facility]) -nephrology consult appreciated  Essential hypertension Continue home antihypertensive medications  COPD (chronic obstructive pulmonary disease) ([place]) exacerbation DuoNebs every 6 hours needed for cough shortness of breath, wheeze -was having wheezing- IV steroids weaned to PO steroids and patient was much improved  Type II diabetes mellitus with renal manifestations ([place]) -monitor closely while on steorids  CAD (coronary artery disease) - plavix  Outpatient palliative care referral- defer to PCP     Medical Consultants:   Renal cards   Discharge Exam:   Vitals:   02/24/20 1106 02/24/20 1143  BP: 136/66 132/87  Pulse: 80 87  Resp: 18 19  Temp: 97.6 F (36.4 C)   SpO2: 100% 100%   Vitals:   02/24/20 1100 02/24/20 1106 02/24/20 1143 02/24/20 1413  BP: (!) 114/58 136/66 132/87   Pulse: 71 80 87   Resp:  18 19   Temp:  97.6 F (36.4 C)    TempSrc:  Oral Oral   SpO2:  100% 100%   Weight:  62.4 kg    Height:    5\' 10"  (1.778 m)    General exam: Appears calm and comfortable.    The  results of significant diagnostics from this hospitalization (including imaging, microbiology, ancillary and laboratory) are listed below for reference.     Procedures and Diagnostic Studies:   DG Chest 1 View  Result Date: 02/19/2020 CLINICAL DATA:  COPD, dyspnea EXAM: CHEST  1 VIEW COMPARISON:  11/30/2019 FINDINGS: There has developed diffuse interstitial pulmonary infiltrate most in keeping with moderate diffuse interstitial pulmonary edema. No pneumothorax or pleural effusion. Cardiac size within normal  limits. No acute bone abnormality. IMPRESSION: Interval development of moderate diffuse interstitial pulmonary edema. Electronically Signed   By: Fidela Salisbury MD   On: 02/19/2020 23:58   XR Shoulder Left  Result Date: 02/19/2020 2 view radiographs of the left shoulder shows superior migration of the humeral head within the glenoid.  XR Shoulder Right  Result Date: 02/19/2020 2 view radiographs of the right shoulder shows previous anchors from rotator cuff repair there is significant superior migration of the humeral head within the glenoid with articulation along the acromion.    Labs:   Basic Metabolic Panel: Recent Labs  Lab 02/20/20 0547 02/20/20 0547 02/21/20 0921 02/21/20 0921 02/22/20 0834 02/22/20 0834 02/23/20 0636 02/23/20 0636 02/24/20 0235 02/24/20 1325  NA 137  --  135  --  132*  --  139  --  136  --   K 5.2*   < > 5.2*   < > 6.2*   < > 5.4*   < > 6.3* 4.0  CL 100  --  95*  --  90*  --  95*  --  95*  --   CO2 18*  --  25  --  22  --  28  --  23  --   GLUCOSE 180*  --  160*  --  181*  --  115*  --  121*  --   BUN 104*  --  68*  --  91*  --  64*  --  94*  --   CREATININE 8.66*  --  5.64*  --  6.75*  --  4.92*  --  6.32*  --   CALCIUM 9.1  --  8.6*  --  8.6*  --  8.9  --  8.9  --   MG  --   --   --   --   --   --   --   --  2.4  --   PHOS  --   --  7.2*  --  9.6*  --  8.3*  --   --   --    < > = values in this interval not displayed.   GFR Estimated Creatinine Clearance: 8.9 mL/min (A) (by C-G formula based on SCr of 6.32 mg/dL (H)). Liver Function Tests: Recent Labs  Lab 02/21/20 0921 02/22/20 0834 02/23/20 0636  ALBUMIN 3.0* 2.6* 2.6*   No results for input(s): LIPASE, AMYLASE in the last 168 hours. No results for input(s): AMMONIA in the last 168 hours. Coagulation profile No results for input(s): INR, PROTIME in the last 168 hours.  CBC: Recent Labs  Lab 02/19/20 2330 02/20/20 0141 02/20/20 0325 02/20/20 0547 02/21/20 0921 02/22/20 0834  02/24/20 0750  WBC 8.8   < > 11.7* 12.3* 9.4 12.0* 12.9*  NEUTROABS 7.5  --   --   --   --   --   --   HGB 11.0*   < > 10.7* 11.1* 11.9* 10.5* 9.7*  HCT 35.2*   < > 34.2* 35.1* 37.1* 33.4* 30.4*  MCV  97.0   < > 95.8 93.4 94.4 93.0 93.8  PLT 201   < > 243 221 203 146* PLATELET CLUMPS NOTED ON SMEAR, COUNT APPEARS DECREASED   < > = values in this interval not displayed.   Cardiac Enzymes: No results for input(s): CKTOTAL, CKMB, CKMBINDEX, TROPONINI in the last 168 hours. BNP: Invalid input(s): POCBNP CBG: Recent Labs  Lab 02/23/20 1112 02/23/20 1620 02/23/20 2132 02/24/20 0558 02/24/20 1141  GLUCAP 122* 164* 112* 135* 94   D-Dimer No results for input(s): DDIMER in the last 72 hours. Hgb A1c No results for input(s): HGBA1C in the last 72 hours. Lipid Profile No results for input(s): CHOL, HDL, LDLCALC, TRIG, CHOLHDL, LDLDIRECT in the last 72 hours. Thyroid function studies No results for input(s): TSH, T4TOTAL, T3FREE, THYROIDAB in the last 72 hours.  Invalid input(s): FREET3 Anemia work up No results for input(s): VITAMINB12, FOLATE, FERRITIN, TIBC, IRON, RETICCTPCT in the last 72 hours. Microbiology Recent Results (from the past 240 hour(s))  SARS Coronavirus 2 by RT PCR (hospital order, performed in [facility] hospital lab) Nasopharyngeal Nasopharyngeal Swab     Status: None   Collection Time: 02/19/20 11:53 PM   Specimen: Nasopharyngeal Swab  Result Value Ref Range Status   SARS Coronavirus 2 NEGATIVE NEGATIVE Final    Comment: (NOTE) SARS-CoV-2 target nucleic acids are NOT DETECTED.  The SARS-CoV-2 RNA is generally detectable in upper and lower respiratory specimens during the acute phase of infection. The lowest concentration of SARS-CoV-2 viral copies this assay can detect is 250 copies / mL. A negative result does not preclude SARS-CoV-2 infection and should not be used as the sole basis for treatment or other patient management decisions.  A negative result  may occur with improper specimen collection / handling, submission of specimen other than nasopharyngeal swab, presence of viral mutation(s) within the areas targeted by this assay, and inadequate number of viral copies (<250 copies / mL). A negative result must be combined with clinical observations, patient history, and epidemiological information.  Fact Sheet for Patients:   StrictlyIdeas.no  Fact Sheet for Healthcare Providers: BankingDealers.co.za  This test is not yet approved or  cleared by the [place] FDA and has been authorized for detection and/or diagnosis of SARS-CoV-2 by FDA under an Emergency Use Authorization (EUA).  This EUA will remain in effect (meaning this test can be used) for the duration of the COVID-19 declaration under Section 564(b)(1) of the Act, 21 U.S.C. section 360bbb-3(b)(1), unless the authorization is terminated or revoked sooner.  Performed at [facility], [place] [address], [place], [place] [address]      Discharge Instructions:   Discharge Instructions    Discharge instructions   Complete by: As directed    Renal diet   Discharge instructions   Complete by: As directed    Renal diet/diabetic diet   Increase activity slowly   Complete by: As directed    Increase activity slowly   Complete by: As directed      Allergies as of 02/24/2020      Reactions   Gelatin Other (See Comments)   ALPHA-GAL DANGER   Meat [alpha-gal] Other (See Comments)   REACTION TO HOOVED ANIMALS PARTICULARLY RED MEAT   Pork-derived Products Other (See Comments)   ALPHA-GAL DANGER   Shellfish Allergy Shortness Of Breath   Chicken Allergy Nausea And Vomiting   Ramipril Swelling   Tongue and throat swelling   Betaine Itching   Dextromethorphan-guaifenesin Swelling, Nausea And Vomiting  Codeine Nausea And Vomiting   Morphine Itching      Medication List    STOP taking these medications   insulin  aspart 100 UNIT/ML injection Commonly known as: novoLOG   lidocaine 5 % Commonly known as: Lidoderm   midodrine 10 MG tablet Commonly known as: PROAMATINE   ondansetron 4 MG tablet Commonly known as: Zofran   oxyCODONE 5 MG immediate release tablet Commonly known as: Roxicodone   polyethylene glycol 17 g packet Commonly known as: MIRALAX / GLYCOLAX     TAKE these medications   Accu-Chek Aviva Plus test strip Generic drug: glucose blood CHECK BLOOD SUGAR   amiodarone 200 MG tablet Commonly known as: PACERONE Take 1 tablet (200 mg total) by mouth daily.   aspirin 81 MG EC tablet Take 1 tablet (81 mg total) by mouth daily.   atorvastatin 80 MG tablet Commonly known as: LIPITOR TAKE 1 TABLET AT BEDTIME   clopidogrel 75 MG tablet Commonly known as: PLAVIX Take 1 tablet (75 mg total) by mouth daily with breakfast.   DIALYVITE 800 WITH ZINC 0.8 MG Tabs Take 3 tablets by mouth with breakfast, with lunch, and with evening meal.   diclofenac Sodium 1 % Gel Commonly known as: VOLTAREN Apply 2 g topically 4 (four) times daily.   docusate sodium 100 MG capsule Commonly known as: COLACE Take 100 mg by mouth daily.   doxycycline 100 MG tablet Commonly known as: VIBRA-TABS Take 1 tablet (100 mg total) by mouth 2 (two) times daily.   ezetimibe 10 MG tablet Commonly known as: ZETIA TAKE 1 TABLET BY MOUTH EVERY DAY   nicotine 14 mg/24hr patch Commonly known as: NICODERM CQ - dosed in mg/24 hours Place 1 patch (14 mg total) onto the skin daily. Start taking on: February 25, 2020   oxyCODONE-acetaminophen 10-325 MG tablet Commonly known as: PERCOCET Take 1 tablet by mouth every 4 (four) hours as needed for pain.   predniSONE 10 MG tablet Commonly known as: DELTASONE 30 mg x 2 days then 20 mg x 2 days then 10 mg x 2 days then stop What changed:   how much to take  how to take this  when to take this  additional instructions   sevelamer carbonate 800 MG  tablet Commonly known as: RENVELA Take 1,600 mg by mouth in the morning, at noon, and at bedtime.   sodium zirconium cyclosilicate 10 g Pack packet Commonly known as: LOKELMA Daily on non-dialysis days   vitamin E 180 MG (400 UNITS) capsule Take 400 Units by mouth daily.       Follow-up Information    [name] Frizzle, MD Follow up on 03/11/2020.   Specialty: Family Medicine Why: Appointment made for you at 12pm, please call to cancel/reschedule as needed.  Contact information: [address] [place] [address] [phone]        Fay Records, MD .   Specialty: Cardiology Contact information: [place] [address] [phone]                Time coordinating discharge: 35 min  Signed:  Geradine Girt DO  [facility] 02/24/2020, 4:27 PM

## 2020-02-24 NOTE — Progress Notes (Addendum)
[facility] Progress Note   Subjective:   Patient seen and examined at bedside during dialysis.  Tolerating HD well.  Reports he feels much better today.  Denies CP, SOB, abdominal pain, n/v/d, weakness, dizziness and fatigue. Chart review notes shortness of breath early this AM.  Objective Vitals:   02/24/20 0720 02/24/20 0730 02/24/20 0800 02/24/20 0830  BP: (!) 151/76 (!) 150/71 (!) 153/72 (!) 151/73  Pulse: 80 81 82 81  Resp:      Temp:      TempSrc:      SpO2:      Weight:       Physical Exam General:chronically ill appearing male in NAD Heart:RRR Lungs:CTAB, nml WOB on 4L O2 via [place] Abdomen:soft, NTND Extremities:no LE edema Dialysis Access: LU AVF cannulated    Filed Weights   02/22/20 0810 02/22/20 1216 02/24/20 0719  Weight: 67.5 kg 63.5 kg 65.4 kg   No intake or output data in the 24 hours ending 02/24/20 0853  Additional Objective Labs: Basic Metabolic Panel: Recent Labs  Lab 02/21/20 0921 02/21/20 0921 02/22/20 0834 02/23/20 0636 02/24/20 0235  NA 135   < > 132* 139 136  K 5.2*   < > 6.2* 5.4* 6.3*  CL 95*   < > 90* 95* 95*  CO2 25   < > 22 28 23   GLUCOSE 160*   < > 181* 115* 121*  BUN 68*   < > 91* 64* 94*  CREATININE 5.64*   < > 6.75* 4.92* 6.32*  CALCIUM 8.6*   < > 8.6* 8.9 8.9  PHOS 7.2*  --  9.6* 8.3*  --    < > = values in this interval not displayed.   Liver Function Tests: Recent Labs  Lab 02/21/20 0921 02/22/20 0834 02/23/20 0636  ALBUMIN 3.0* 2.6* 2.6*   CBC: Recent Labs  Lab 02/19/20 2330 02/20/20 0141 02/20/20 0325 02/20/20 0325 02/20/20 0547 02/21/20 0921 02/22/20 0834  WBC 8.8   < > 11.7*   < > 12.3* 9.4 12.0*  NEUTROABS 7.5  --   --   --   --   --   --   HGB 11.0*   < > 10.7*   < > 11.1* 11.9* 10.5*  HCT 35.2*   < > 34.2*   < > 35.1* 37.1* 33.4*  MCV 97.0  --  95.8  --  93.4 94.4 93.0  PLT 201   < > 243   < > 221 203 146*   < > = values in this interval not displayed.   CBG: Recent Labs  Lab  02/23/20 0554 02/23/20 1112 02/23/20 1620 02/23/20 2132 02/24/20 0558  GLUCAP 122* 122* 164* 112* 135*    Medications:  . amiodarone  200 mg Oral Daily  . aspirin EC  81 mg Oral Daily  . atorvastatin  80 mg Oral QHS  . Chlorhexidine Gluconate Cloth  6 each Topical Q0600  . clopidogrel  75 mg Oral Q breakfast  . diclofenac Sodium  2 g Topical QID  . doxercalciferol  1 mcg Intravenous Q T,Th,Sa-HD  . doxycycline  100 mg Oral Q12H  . ezetimibe  10 mg Oral Daily  . feeding supplement (NEPRO CARB STEADY)  237 mL Oral BID BM  . heparin  5,000 Units Subcutaneous Q8H  . insulin aspart  0-5 Units Subcutaneous QHS  . insulin aspart  0-9 Units Subcutaneous TID WC  . multivitamin  1 tablet Oral QHS  . nicotine  14 mg Transdermal Daily  . predniSONE  40 mg Oral Q breakfast  . sevelamer carbonate  2,400 mg Oral TID WC  . sodium zirconium cyclosilicate  10 g Oral BID    Dialysis Orders: [facility] on TTS, 3 hours 45 minutes, 64.5 kg dry weight, 2K, 2.5 ca bath, left arm AV fistula, no heparin, Mircera last given 75 mcg 02/13/2020, Hectorol 1 mic qHD  Assessment/Plan: 1. Dyspnea/Pulmonary edema/Volume overload - Improved. in setting of  COPD and ongoing 1pack/day tobacco abuse.  CXR 8/24 improved. CXR today pending. Does not appear grossly volume overloaded.  Recent weight loss, 1kg under dry after last HD, challenging EDW again today, will lower dry on d/c . Post HD K+ is down to 4.0 and pt should be ready for dc today.  2. Hyperkalemia - likely due to dietary indiscretions but could also be due to blood recirculating if AVF has area of stenosis.  Last AF low, missed fistulogram scheduled 8/25, if still here on Monday will consult IR for fistulogram, if not can have it rescheduled at OP.  Counseled on diet.  K 6.3 this AM.  Using low K bath for part of treatment today.  Lokelma ordered daily.   3. Multilobular PNA b/l - on ABX. Per primary 4. Chest pain/Hx CAD - resolved. cardiology  following 5. ESRD - on HD TTS.  HD today per regular schedule.  6. Anemia of CKD- Hgb 10.5. ESA recently dosed.  7. Secondary hyperparathyroidism - Ca at goal. Phos elevated.  Renvela just increased to 3tab AC TID.  Continue VDRA. 8. HTN - Blood pressure elevated this AM, improving with HD. On midodrine for BP support with HD, hold with elevated BP.  Monitor.  9. Nutrition - Renal diet w/fluid restrictions. Protein supplements.  10. R groin hernia - medical management per pt.  11. COPD - on 3-4L O2 at home per pt 12. Diabetes - per primary  Jen Mow, PA-C [place] 02/24/2020,8:53 AM  LOS: 4 days   Pt seen, examined and agree w A/P as above.  Kelly Splinter  MD 02/24/2020, 2:55 PM

## 2020-02-24 NOTE — Progress Notes (Signed)
Patient complaining of difficulty breathing, vital signs taken. Dr Myna Hidalgo text paged.    02/24/20 0408  Vitals  Temp 97.8 F (36.6 C)  Temp Source Oral  BP (!) 155/74  MAP (mmHg) 98  BP Location Right Arm  BP Method Automatic  Patient Position (if appropriate) Lying  Pulse Rate 79  Resp 18  MEWS COLOR  MEWS Score Color Green  Oxygen Therapy  SpO2 100 %  O2 Device Nasal Cannula  O2 Flow Rate (L/min) 3 L/min  MEWS Score  MEWS Temp 0  MEWS Systolic 0  MEWS Pulse 0  MEWS RR 0  MEWS LOC 0  MEWS Score 0

## 2020-02-25 ENCOUNTER — Telehealth: Payer: Self-pay | Admitting: Nephrology

## 2020-02-25 NOTE — Telephone Encounter (Signed)
Transition of Care Contact from [place]  Date of Discharge: 02/24/20 Date of Contact: 02/25/20 Method of contact: phone - attempted  Attempted to contact patient to discuss transition of care from inpatient admission.  Patient did not answer the phone.  Message was left on patient's voicemail informing them we would attempt to call them again and if unable to reach will follow up at dialysis.  Jen Mow, PA-C [facility] Pager: [phone]

## 2020-02-26 NOTE — Telephone Encounter (Signed)
Transition of care contact from inpatient facility  Date of discharge: 02/24/20 Date of contact: 02/26/20 Method: Phone Spoke to: Patient"s Daughter   Patient contacted to discuss transition of care from recent inpatient hospitalization. Patient was admitted to [facility] from 02/19/20 to 02/24/20 ... with discharge diagnosis of .hyperkalemia., Acute /chronicResp failure with copd  Vol overload/pna..Had discussion with Daughter who will review [place] food list ( Dietitian to give tomorrow at OP HD )  Medication changes were reviewed.  Patient will follow up with his/her outpatient HD unit on 02/27/20

## 2020-02-27 DIAGNOSIS — N2581 Secondary hyperparathyroidism of renal origin: Secondary | ICD-10-CM | POA: Diagnosis not present

## 2020-02-27 DIAGNOSIS — Z992 Dependence on renal dialysis: Secondary | ICD-10-CM | POA: Diagnosis not present

## 2020-02-27 DIAGNOSIS — N186 End stage renal disease: Secondary | ICD-10-CM | POA: Diagnosis not present

## 2020-02-27 DIAGNOSIS — E1122 Type 2 diabetes mellitus with diabetic chronic kidney disease: Secondary | ICD-10-CM | POA: Diagnosis not present

## 2020-02-29 DIAGNOSIS — Z992 Dependence on renal dialysis: Secondary | ICD-10-CM | POA: Diagnosis not present

## 2020-02-29 DIAGNOSIS — N2581 Secondary hyperparathyroidism of renal origin: Secondary | ICD-10-CM | POA: Diagnosis not present

## 2020-02-29 DIAGNOSIS — N186 End stage renal disease: Secondary | ICD-10-CM | POA: Diagnosis not present

## 2020-03-01 ENCOUNTER — Other Ambulatory Visit: Payer: Self-pay

## 2020-03-01 DIAGNOSIS — Z992 Dependence on renal dialysis: Secondary | ICD-10-CM | POA: Diagnosis not present

## 2020-03-01 DIAGNOSIS — I871 Compression of vein: Secondary | ICD-10-CM | POA: Diagnosis not present

## 2020-03-01 DIAGNOSIS — N186 End stage renal disease: Secondary | ICD-10-CM | POA: Diagnosis not present

## 2020-03-01 DIAGNOSIS — T82858A Stenosis of vascular prosthetic devices, implants and grafts, initial encounter: Secondary | ICD-10-CM | POA: Diagnosis not present

## 2020-03-01 MED ORDER — OXYCODONE-ACETAMINOPHEN 10-325 MG PO TABS
1.0000 | ORAL_TABLET | ORAL | 0 refills | Status: DC | PRN
Start: 2020-03-01 — End: 2020-04-05

## 2020-03-02 DIAGNOSIS — Z992 Dependence on renal dialysis: Secondary | ICD-10-CM | POA: Diagnosis not present

## 2020-03-02 DIAGNOSIS — N2581 Secondary hyperparathyroidism of renal origin: Secondary | ICD-10-CM | POA: Diagnosis not present

## 2020-03-02 DIAGNOSIS — N186 End stage renal disease: Secondary | ICD-10-CM | POA: Diagnosis not present

## 2020-03-03 DIAGNOSIS — N186 End stage renal disease: Secondary | ICD-10-CM | POA: Diagnosis not present

## 2020-03-05 DIAGNOSIS — Z992 Dependence on renal dialysis: Secondary | ICD-10-CM | POA: Diagnosis not present

## 2020-03-05 DIAGNOSIS — N186 End stage renal disease: Secondary | ICD-10-CM | POA: Diagnosis not present

## 2020-03-05 DIAGNOSIS — N2581 Secondary hyperparathyroidism of renal origin: Secondary | ICD-10-CM | POA: Diagnosis not present

## 2020-03-07 DIAGNOSIS — J810 Acute pulmonary edema: Secondary | ICD-10-CM | POA: Diagnosis not present

## 2020-03-07 DIAGNOSIS — Z992 Dependence on renal dialysis: Secondary | ICD-10-CM | POA: Diagnosis not present

## 2020-03-07 DIAGNOSIS — I120 Hypertensive chronic kidney disease with stage 5 chronic kidney disease or end stage renal disease: Secondary | ICD-10-CM | POA: Diagnosis not present

## 2020-03-07 DIAGNOSIS — N186 End stage renal disease: Secondary | ICD-10-CM | POA: Diagnosis not present

## 2020-03-07 DIAGNOSIS — N2581 Secondary hyperparathyroidism of renal origin: Secondary | ICD-10-CM | POA: Diagnosis not present

## 2020-03-07 DIAGNOSIS — E8770 Fluid overload, unspecified: Secondary | ICD-10-CM | POA: Diagnosis not present

## 2020-03-07 DIAGNOSIS — E875 Hyperkalemia: Secondary | ICD-10-CM | POA: Diagnosis not present

## 2020-03-08 ENCOUNTER — Ambulatory Visit (INDEPENDENT_AMBULATORY_CARE_PROVIDER_SITE_OTHER): Payer: Medicare HMO | Admitting: Family Medicine

## 2020-03-08 ENCOUNTER — Other Ambulatory Visit: Payer: Self-pay

## 2020-03-08 VITALS — BP 130/58 | HR 82 | Temp 97.9°F | Ht 70.0 in | Wt 137.0 lb

## 2020-03-08 DIAGNOSIS — R627 Adult failure to thrive: Secondary | ICD-10-CM

## 2020-03-08 DIAGNOSIS — N186 End stage renal disease: Secondary | ICD-10-CM | POA: Diagnosis not present

## 2020-03-08 DIAGNOSIS — I953 Hypotension of hemodialysis: Secondary | ICD-10-CM | POA: Diagnosis not present

## 2020-03-08 DIAGNOSIS — Z992 Dependence on renal dialysis: Secondary | ICD-10-CM
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
## 2020-03-09 DIAGNOSIS — J9621 Acute and chronic respiratory failure with hypoxia: Secondary | ICD-10-CM | POA: Diagnosis not present

## 2020-03-09 DIAGNOSIS — Z992 Dependence on renal dialysis: Secondary | ICD-10-CM | POA: Diagnosis not present

## 2020-03-09 DIAGNOSIS — J449 Chronic obstructive pulmonary disease, unspecified: Secondary | ICD-10-CM | POA: Diagnosis not present

## 2020-03-09 DIAGNOSIS — I2510 Atherosclerotic heart disease of native coronary artery without angina pectoris: Secondary | ICD-10-CM | POA: Diagnosis not present

## 2020-03-09 DIAGNOSIS — I509 Heart failure, unspecified: Secondary | ICD-10-CM | POA: Diagnosis not present

## 2020-03-09 DIAGNOSIS — N186 End stage renal disease: Secondary | ICD-10-CM | POA: Diagnosis not present

## 2020-03-09 DIAGNOSIS — J849 Interstitial pulmonary disease, unspecified: Secondary | ICD-10-CM | POA: Diagnosis not present

## 2020-03-09 DIAGNOSIS — N2581 Secondary hyperparathyroidism of renal origin: Secondary | ICD-10-CM | POA: Diagnosis not present

## 2020-03-11 ENCOUNTER — Ambulatory Visit: Payer: Medicare HMO | Admitting: Family Medicine

## 2020-03-11 ENCOUNTER — Telehealth: Payer: Self-pay

## 2020-03-11 DIAGNOSIS — I480 Paroxysmal atrial fibrillation: Secondary | ICD-10-CM | POA: Diagnosis not present

## 2020-03-11 DIAGNOSIS — J440 Chronic obstructive pulmonary disease with acute lower respiratory infection: Secondary | ICD-10-CM | POA: Diagnosis not present

## 2020-03-11 DIAGNOSIS — I2510 Atherosclerotic heart disease of native coronary artery without angina pectoris: Secondary | ICD-10-CM | POA: Diagnosis not present

## 2020-03-11 DIAGNOSIS — E1122 Type 2 diabetes mellitus with diabetic chronic kidney disease: Secondary | ICD-10-CM | POA: Diagnosis not present

## 2020-03-11 DIAGNOSIS — M1990 Unspecified osteoarthritis, unspecified site: Secondary | ICD-10-CM | POA: Diagnosis not present

## 2020-03-11 DIAGNOSIS — I5030 Unspecified diastolic (congestive) heart failure: Secondary | ICD-10-CM | POA: Diagnosis not present

## 2020-03-11 DIAGNOSIS — J9621 Acute and chronic respiratory failure with hypoxia: Secondary | ICD-10-CM | POA: Diagnosis not present

## 2020-03-11 DIAGNOSIS — J189 Pneumonia, unspecified organism: Secondary | ICD-10-CM | POA: Diagnosis not present

## 2020-03-11 DIAGNOSIS — J449 Chronic obstructive pulmonary disease, unspecified: Secondary | ICD-10-CM | POA: Diagnosis not present

## 2020-03-11 DIAGNOSIS — I132 Hypertensive heart and chronic kidney disease with heart failure and with stage 5 chronic kidney disease, or end stage renal disease: Secondary | ICD-10-CM | POA: Diagnosis not present

## 2020-03-11 DIAGNOSIS — I739 Peripheral vascular disease, unspecified: Secondary | ICD-10-CM | POA: Diagnosis not present

## 2020-03-11 DIAGNOSIS — N186 End stage renal disease: Secondary | ICD-10-CM | POA: Diagnosis not present

## 2020-03-11 NOTE — Telephone Encounter (Signed)
Letting you know his care will start today 03/11/2020

## 2020-03-12 DIAGNOSIS — N186 End stage renal disease: Secondary | ICD-10-CM | POA: Diagnosis not present

## 2020-03-12 DIAGNOSIS — N2581 Secondary hyperparathyroidism of renal origin: Secondary | ICD-10-CM | POA: Diagnosis not present

## 2020-03-12 DIAGNOSIS — Z992 Dependence on renal dialysis: Secondary | ICD-10-CM | POA: Diagnosis not present

## 2020-03-13 DIAGNOSIS — Z515 Encounter for palliative care: Secondary | ICD-10-CM | POA: Diagnosis not present

## 2020-03-13 DIAGNOSIS — N186 End stage renal disease: Secondary | ICD-10-CM | POA: Diagnosis not present

## 2020-03-13 DIAGNOSIS — Z992 Dependence on renal dialysis: Secondary | ICD-10-CM | POA: Diagnosis not present

## 2020-03-13 DIAGNOSIS — R627 Adult failure to thrive: Secondary | ICD-10-CM | POA: Diagnosis not present

## 2020-03-14 DIAGNOSIS — N186 End stage renal disease: Secondary | ICD-10-CM | POA: Diagnosis not present

## 2020-03-14 DIAGNOSIS — Z992 Dependence on renal dialysis: Secondary | ICD-10-CM | POA: Diagnosis not present

## 2020-03-14 DIAGNOSIS — N2581 Secondary hyperparathyroidism of renal origin: Secondary | ICD-10-CM | POA: Diagnosis not present

## 2020-03-15 DIAGNOSIS — I509 Heart failure, unspecified: Secondary | ICD-10-CM | POA: Diagnosis not present

## 2020-03-15 DIAGNOSIS — J9621 Acute and chronic respiratory failure with hypoxia: Secondary | ICD-10-CM | POA: Diagnosis not present

## 2020-03-15 DIAGNOSIS — J849 Interstitial pulmonary disease, unspecified: Secondary | ICD-10-CM | POA: Diagnosis not present

## 2020-03-15 DIAGNOSIS — I2510 Atherosclerotic heart disease of native coronary artery without angina pectoris: Secondary | ICD-10-CM | POA: Diagnosis not present

## 2020-03-15 DIAGNOSIS — J449 Chronic obstructive pulmonary disease, unspecified: Secondary | ICD-10-CM | POA: Diagnosis not present

## 2020-03-16 DIAGNOSIS — N186 End stage renal disease: Secondary | ICD-10-CM | POA: Diagnosis not present

## 2020-03-16 DIAGNOSIS — Z992 Dependence on renal dialysis: Secondary | ICD-10-CM | POA: Diagnosis not present

## 2020-03-16 DIAGNOSIS — N2581 Secondary hyperparathyroidism of renal origin: Secondary | ICD-10-CM | POA: Diagnosis not present

## 2020-03-19 DIAGNOSIS — N2581 Secondary hyperparathyroidism of renal origin: Secondary | ICD-10-CM | POA: Diagnosis not present

## 2020-03-19 DIAGNOSIS — Z992 Dependence on renal dialysis: Secondary | ICD-10-CM | POA: Diagnosis not present

## 2020-03-19 DIAGNOSIS — N186 End stage renal disease: Secondary | ICD-10-CM | POA: Diagnosis not present

## 2020-03-20 DIAGNOSIS — E1122 Type 2 diabetes mellitus with diabetic chronic kidney disease: Secondary | ICD-10-CM | POA: Diagnosis not present

## 2020-03-20 DIAGNOSIS — J9621 Acute and chronic respiratory failure with hypoxia: Secondary | ICD-10-CM | POA: Diagnosis not present

## 2020-03-20 DIAGNOSIS — I5030 Unspecified diastolic (congestive) heart failure: Secondary | ICD-10-CM | POA: Diagnosis not present

## 2020-03-20 DIAGNOSIS — I480 Paroxysmal atrial fibrillation: Secondary | ICD-10-CM | POA: Diagnosis not present

## 2020-03-20 DIAGNOSIS — N186 End stage renal disease: Secondary | ICD-10-CM | POA: Diagnosis not present

## 2020-03-20 DIAGNOSIS — I132 Hypertensive heart and chronic kidney disease with heart failure and with stage 5 chronic kidney disease, or end stage renal disease: Secondary | ICD-10-CM | POA: Diagnosis not present

## 2020-03-20 DIAGNOSIS — J440 Chronic obstructive pulmonary disease with acute lower respiratory infection: Secondary | ICD-10-CM | POA: Diagnosis not present

## 2020-03-20 DIAGNOSIS — J189 Pneumonia, unspecified organism: Secondary | ICD-10-CM | POA: Diagnosis not present

## 2020-03-20 DIAGNOSIS — I2510 Atherosclerotic heart disease of native coronary artery without angina pectoris: Secondary | ICD-10-CM | POA: Diagnosis not present

## 2020-03-21 DIAGNOSIS — N2581 Secondary hyperparathyroidism of renal origin: Secondary | ICD-10-CM | POA: Diagnosis not present

## 2020-03-21 DIAGNOSIS — N186 End stage renal disease: Secondary | ICD-10-CM | POA: Diagnosis not present

## 2020-03-21 DIAGNOSIS — Z992 Dependence on renal dialysis: Secondary | ICD-10-CM | POA: Diagnosis not present
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
## 2020-03-22 ENCOUNTER — Ambulatory Visit (INDEPENDENT_AMBULATORY_CARE_PROVIDER_SITE_OTHER): Payer: Medicare HMO | Admitting: Podiatry

## 2020-03-22 ENCOUNTER — Ambulatory Visit (INDEPENDENT_AMBULATORY_CARE_PROVIDER_SITE_OTHER): Payer: Medicare HMO | Admitting: Physician Assistant

## 2020-03-22 ENCOUNTER — Other Ambulatory Visit: Payer: Self-pay

## 2020-03-22 ENCOUNTER — Encounter: Payer: Self-pay | Admitting: Podiatry

## 2020-03-22 ENCOUNTER — Encounter: Payer: Self-pay | Admitting: Physician Assistant

## 2020-03-22 VITALS — BP 106/50 | HR 74 | Ht 70.0 in | Wt 138.2 lb

## 2020-03-22 DIAGNOSIS — B351 Tinea unguium: Secondary | ICD-10-CM

## 2020-03-22 DIAGNOSIS — I10 Essential (primary) hypertension: Secondary | ICD-10-CM

## 2020-03-22 DIAGNOSIS — Z794 Long term (current) use of insulin: Secondary | ICD-10-CM | POA: Diagnosis not present

## 2020-03-22 DIAGNOSIS — I2510 Atherosclerotic heart disease of native coronary artery without angina pectoris: Secondary | ICD-10-CM | POA: Diagnosis not present

## 2020-03-22 DIAGNOSIS — E782 Mixed hyperlipidemia: Secondary | ICD-10-CM

## 2020-03-22 DIAGNOSIS — M79675 Pain in left toe(s): Secondary | ICD-10-CM

## 2020-03-22 DIAGNOSIS — E1121 Type 2 diabetes mellitus with diabetic nephropathy: Secondary | ICD-10-CM

## 2020-03-22 DIAGNOSIS — N186 End stage renal disease: Secondary | ICD-10-CM

## 2020-03-22 DIAGNOSIS — M79674 Pain in right toe(s): Secondary | ICD-10-CM | POA: Diagnosis not present

## 2020-03-22 DIAGNOSIS — I739 Peripheral vascular disease, unspecified: Secondary | ICD-10-CM

## 2020-03-22 DIAGNOSIS — Z992 Dependence on renal dialysis: Secondary | ICD-10-CM | POA: Diagnosis not present

## 2020-03-22 DIAGNOSIS — I480 Paroxysmal atrial fibrillation: Secondary | ICD-10-CM | POA: Diagnosis not present

## 2020-03-22 LAB — CBC
Hematocrit: 26.3 % — ABNORMAL LOW (ref 37.5–51.0)
Hemoglobin: 8.1 g/dL — ABNORMAL LOW (ref 13.0–17.7)
MCH: 28.2 pg (ref 26.6–33.0)
MCHC: 30.8 g/dL — ABNORMAL LOW (ref 31.5–35.7)
MCV: 92 fL (ref 79–97)
Platelets: 239 10*3/uL (ref 150–450)
RBC: 2.87 x10E6/uL — ABNORMAL LOW (ref 4.14–5.80)
RDW: 16.7 % — ABNORMAL HIGH (ref 11.6–15.4)
WBC: 5.8 10*3/uL (ref 3.4–10.8)

## 2020-03-22 NOTE — Patient Instructions (Signed)
Medication Instructions:  Your physician recommends that you continue on your current medications as directed. Please refer to the Current Medication list given to you today.  *If you need a refill on your cardiac medications before your next appointment, please call your pharmacy*   Lab Work: TODAY:  CBC  If you have labs (blood work) drawn today and your tests are completely normal, you will receive your results only by: Marland Kitchen MyChart Message (if you have MyChart) OR . A paper copy in the mail If you have any lab test that is abnormal or we need to change your treatment, we will call you to review the results.   Testing/Procedures: None ordered   Follow-Up: At [facility], you and your health needs are our priority.  As part of our continuing mission to provide you with exceptional heart care, we have created designated Provider Care Teams.  These Care Teams include your primary Cardiologist (physician) and Advanced Practice Providers (APPs -  Physician Assistants and Nurse Practitioners) who all work together to provide you with the care you need, when you need it.  We recommend signing up for the patient portal called "MyChart".  Sign up information is provided on this After Visit Summary.  MyChart is used to connect with patients for Virtual Visits (Telemedicine).  Patients are able to view lab/test results, encounter notes, upcoming appointments, etc.  Non-urgent messages can be sent to your provider as well.   To learn more about what you can do with MyChart, go to [email or web address].    Your next appointment:   3 month(s)  The format for your next appointment:   In Person  Provider:   Dorris Carnes, MD   Other Instructions

## 2020-03-22 NOTE — Progress Notes (Signed)
This patient returns to my office for at risk foot care.  This patient requires this care by a professional since this patient will be at risk due to having PVD,  PAD and previous  DVT.  Patient also has diabetes with kidney disease.    This patient is unable to cut nails himself since the patient cannot reach his nails.These nails are painful walking and wearing shoes.  This patient presents for at risk foot care today.  HOH.  General Appearance  Alert, conversant and in no acute stress.  Vascular  Dorsalis pedis and posterior tibial  pulses are weakly  palpable  bilaterally.  Capillary return is within normal limits  bilaterally. Temperature is within normal limits  bilaterally.  Neurologic  Senn-Weinstein monofilament wire test diminished   bilaterally. Muscle power within normal limits bilaterally.  Nails Thick disfigured discolored nails with subungual debris  from hallux to fifth toes bilaterally. No evidence of bacterial infection or drainage bilaterally.  Orthopedic  No limitations of motion  feet .  No crepitus or effusions noted.  No bony pathology or digital deformities noted.  Skin  normotropic skin with no porokeratosis noted bilaterally.  No signs of infections or ulcers noted.     Onychomycosis  Pain in right toes  Pain in left toes  Consent was obtained for treatment procedures.   Mechanical debridement of nails 1-5  bilaterally performed with a nail nipper.  Filed with dremel without incident. No infection or ulcer.     Return office visit   3 months                  Told patient to return for periodic foot care and evaluation due to potential at risk complications.   Gardiner Barefoot DPM

## 2020-03-23 DIAGNOSIS — N186 End stage renal disease: Secondary | ICD-10-CM | POA: Diagnosis not present

## 2020-03-23 DIAGNOSIS — Z992 Dependence on renal dialysis: Secondary | ICD-10-CM | POA: Diagnosis not present

## 2020-03-23 DIAGNOSIS — N2581 Secondary hyperparathyroidism of renal origin: Secondary | ICD-10-CM | POA: Diagnosis not present

## 2020-03-25 ENCOUNTER — Telehealth: Payer: Self-pay

## 2020-03-25 NOTE — Telephone Encounter (Signed)
Malarie from [facility] called with verbal orders.  Home Health Orders, OT orders  1 week 1  2 week 2 1 week 4 Verbal orders given.

## 2020-03-26 ENCOUNTER — Telehealth: Payer: Self-pay | Admitting: *Deleted

## 2020-03-26 DIAGNOSIS — Z992 Dependence on renal dialysis: Secondary | ICD-10-CM | POA: Diagnosis not present

## 2020-03-26 DIAGNOSIS — N186 End stage renal disease: Secondary | ICD-10-CM | POA: Diagnosis not present

## 2020-03-26 DIAGNOSIS — N2581 Secondary hyperparathyroidism of renal origin: Secondary | ICD-10-CM | POA: Diagnosis not present

## 2020-03-26 DIAGNOSIS — D649 Anemia, unspecified: Secondary | ICD-10-CM

## 2020-03-26 NOTE — Telephone Encounter (Signed)
Left message to call office

## 2020-03-26 NOTE — Telephone Encounter (Signed)
Patient's daughter notified. Patient will come to office tomorrow for lab work

## 2020-03-26 NOTE — Telephone Encounter (Signed)
-----   Message from Fay Records, MD sent at 03/26/2020 11:18 AM EDT ----- Patient on ASA and Plavix due to stent placement Would repeat CBC today/tomorrow Also check Fe, ferritin

## 2020-03-26 NOTE — Telephone Encounter (Signed)
Patient's daughter is returning call.

## 2020-03-27 ENCOUNTER — Other Ambulatory Visit: Payer: Medicare HMO | Admitting: *Deleted

## 2020-03-27 ENCOUNTER — Other Ambulatory Visit: Payer: Self-pay

## 2020-03-27 DIAGNOSIS — N186 End stage renal disease: Secondary | ICD-10-CM | POA: Diagnosis not present

## 2020-03-27 DIAGNOSIS — I480 Paroxysmal atrial fibrillation: Secondary | ICD-10-CM | POA: Diagnosis not present

## 2020-03-27 DIAGNOSIS — J440 Chronic obstructive pulmonary disease with acute lower respiratory infection: Secondary | ICD-10-CM | POA: Diagnosis not present

## 2020-03-27 DIAGNOSIS — J9621 Acute and chronic respiratory failure with hypoxia: Secondary | ICD-10-CM | POA: Diagnosis not present

## 2020-03-27 DIAGNOSIS — I5030 Unspecified diastolic (congestive) heart failure: Secondary | ICD-10-CM | POA: Diagnosis not present

## 2020-03-27 DIAGNOSIS — E1122 Type 2 diabetes mellitus with diabetic chronic kidney disease: Secondary | ICD-10-CM | POA: Diagnosis not present

## 2020-03-27 DIAGNOSIS — D649 Anemia, unspecified: Secondary | ICD-10-CM | POA: Diagnosis not present

## 2020-03-27 DIAGNOSIS — I2510 Atherosclerotic heart disease of native coronary artery without angina pectoris: Secondary | ICD-10-CM | POA: Diagnosis not present

## 2020-03-27 DIAGNOSIS — J189 Pneumonia, unspecified organism: Secondary | ICD-10-CM | POA: Diagnosis not present

## 2020-03-27 DIAGNOSIS — I132 Hypertensive heart and chronic kidney disease with heart failure and with stage 5 chronic kidney disease, or end stage renal disease: Secondary | ICD-10-CM | POA: Diagnosis not present

## 2020-03-27 LAB — CBC
Hematocrit: 23.2 % — ABNORMAL LOW (ref 37.5–51.0)
Hemoglobin: 7.4 g/dL — ABNORMAL LOW (ref 13.0–17.7)
MCH: 27.8 pg (ref 26.6–33.0)
MCHC: 31.9 g/dL (ref 31.5–35.7)
MCV: 87 fL (ref 79–97)
Platelets: 277 10*3/uL (ref 150–450)
RBC: 2.66 x10E6/uL — CL (ref 4.14–5.80)
RDW: 16.2 % — ABNORMAL HIGH (ref 11.6–15.4)
WBC: 6.4 10*3/uL (ref 3.4–10.8)

## 2020-03-27 LAB — FERRITIN: Ferritin: 213 ng/mL (ref 30–400)

## 2020-03-27 LAB — IRON: Iron: 40 ug/dL (ref 38–169)

## 2020-03-28 ENCOUNTER — Other Ambulatory Visit: Payer: Self-pay | Admitting: *Deleted

## 2020-03-28 ENCOUNTER — Telehealth: Payer: Self-pay | Admitting: Internal Medicine

## 2020-03-28 DIAGNOSIS — N2581 Secondary hyperparathyroidism of renal origin: Secondary | ICD-10-CM | POA: Diagnosis not present

## 2020-03-28 DIAGNOSIS — N186 End stage renal disease: Secondary | ICD-10-CM | POA: Diagnosis not present

## 2020-03-28 DIAGNOSIS — D649 Anemia, unspecified: Secondary | ICD-10-CM

## 2020-03-28 DIAGNOSIS — E1122 Type 2 diabetes mellitus with diabetic chronic kidney disease: Secondary | ICD-10-CM | POA: Diagnosis not present

## 2020-03-28 DIAGNOSIS — Z992 Dependence on renal dialysis: Secondary | ICD-10-CM | POA: Diagnosis not present

## 2020-03-28 MED ORDER — PANTOPRAZOLE SODIUM 40 MG PO TBEC: 40.0000 mg | DELAYED_RELEASE_TABLET | Freq: Every day | ORAL | 11 refills | Status: AC

## 2020-03-28 NOTE — Telephone Encounter (Signed)
Malory left vm states this is her 3rd attempt to get verbal orders for OT for 2 x for 2 weeks, 1x for 4 weeks. She also states they have faxed request.  CB# [phone]

## 2020-03-28 NOTE — Addendum Note (Signed)
Addended by: Rodman Key on: 03/28/2020 02:25 PM   Modules accepted: Orders

## 2020-03-28 NOTE — Telephone Encounter (Signed)
Hgb was low 7.4 yesterday     I left a msg to stop Plavix and ASA Start acid inhibitor CBC stat today  Needs referral to GI  Call in Rx for protonxi 40 daily If bleeding go to ED

## 2020-03-28 NOTE — Telephone Encounter (Signed)
Message from referral coordinator.  Pt was seen by Dr. Benson Norway in 2020.  I have changed referral from [facility] to Dr. Benson Norway.

## 2020-03-28 NOTE — Addendum Note (Signed)
Addended by: Rodman Key on: 03/28/2020 04:50 PM   Modules accepted: Orders

## 2020-03-28 NOTE — Telephone Encounter (Signed)
Received call from [facility], [facility] OT with [facility] [phone]~ telephone.   Reports that plan of care is strengthening, endurance and IADL's.   VO given.

## 2020-03-28 NOTE — Telephone Encounter (Signed)
Spoke w patient's step daughter, [name].  Pt is at dialysis in [place] currently.  She got the message from Dr. Harrington Challenger yesterday.  Pt did not have aspirin and Plavix today.  Pt will go to [facility] in [place] today for stat CBC and anemia panel  Will pick up Protonix 40 mg and begin today.  Will await call from [facility] and will go to ED for any signs of bleeding.

## 2020-03-29 ENCOUNTER — Other Ambulatory Visit: Payer: Self-pay | Admitting: *Deleted

## 2020-03-29 ENCOUNTER — Telehealth: Payer: Self-pay

## 2020-03-29 ENCOUNTER — Telehealth: Payer: Self-pay | Admitting: Internal Medicine

## 2020-03-29 DIAGNOSIS — J9621 Acute and chronic respiratory failure with hypoxia: Secondary | ICD-10-CM | POA: Diagnosis not present

## 2020-03-29 DIAGNOSIS — I132 Hypertensive heart and chronic kidney disease with heart failure and with stage 5 chronic kidney disease, or end stage renal disease: Secondary | ICD-10-CM | POA: Diagnosis not present

## 2020-03-29 DIAGNOSIS — J440 Chronic obstructive pulmonary disease with acute lower respiratory infection: Secondary | ICD-10-CM | POA: Diagnosis not present

## 2020-03-29 DIAGNOSIS — J189 Pneumonia, unspecified organism: Secondary | ICD-10-CM | POA: Diagnosis not present

## 2020-03-29 DIAGNOSIS — D649 Anemia, unspecified: Secondary | ICD-10-CM

## 2020-03-29 DIAGNOSIS — E1122 Type 2 diabetes mellitus with diabetic chronic kidney disease: Secondary | ICD-10-CM | POA: Diagnosis not present

## 2020-03-29 DIAGNOSIS — I5030 Unspecified diastolic (congestive) heart failure: Secondary | ICD-10-CM | POA: Diagnosis not present

## 2020-03-29 DIAGNOSIS — N186 End stage renal disease: Secondary | ICD-10-CM | POA: Diagnosis not present

## 2020-03-29 DIAGNOSIS — I480 Paroxysmal atrial fibrillation: Secondary | ICD-10-CM | POA: Diagnosis not present

## 2020-03-29 DIAGNOSIS — I2510 Atherosclerotic heart disease of native coronary artery without angina pectoris: Secondary | ICD-10-CM | POA: Diagnosis not present

## 2020-03-29 LAB — FERRITIN: Ferritin: 216 ng/mL (ref 30–400)

## 2020-03-29 LAB — IRON AND TIBC
Iron Saturation: 13 % — ABNORMAL LOW (ref 15–55)
Iron: 34 ug/dL — ABNORMAL LOW (ref 38–169)
Total Iron Binding Capacity: 257 ug/dL (ref 250–450)
UIBC: 223 ug/dL (ref 111–343)

## 2020-03-29 LAB — CBC
Hematocrit: 27.5 % — ABNORMAL LOW (ref 37.5–51.0)
Hemoglobin: 8.5 g/dL — ABNORMAL LOW (ref 13.0–17.7)
MCH: 27.2 pg (ref 26.6–33.0)
MCHC: 30.9 g/dL — ABNORMAL LOW (ref 31.5–35.7)
MCV: 88 fL (ref 79–97)
Platelets: 324 10*3/uL (ref 150–450)
RBC: 3.13 x10E6/uL — ABNORMAL LOW (ref 4.14–5.80)
RDW: 16.5 % — ABNORMAL HIGH (ref 11.6–15.4)
WBC: 5.9 10*3/uL (ref 3.4–10.8)

## 2020-03-29 LAB — VITAMIN B12: Vitamin B-12: 789 pg/mL (ref 232–1245)

## 2020-03-29 LAB — FOLATE: Folate: 18.6 ng/mL

## 2020-03-29 MED ORDER — FERROUS SULFATE 324 (65 FE) MG PO TBEC: 1.0000 | DELAYED_RELEASE_TABLET | Freq: Every day | ORAL | 3 refills | Status: AC

## 2020-03-29 NOTE — Telephone Encounter (Signed)
I spoke with patient's daughter [name]). They have a supply of iron at home. He will begin one daily. Will repeat CBC prior to dialysis on Tuesday. Order placed and released for [facility].

## 2020-03-29 NOTE — Telephone Encounter (Signed)
New message:    Patient calling to get some lab results. Please call patient.

## 2020-03-29 NOTE — Telephone Encounter (Signed)
[name] called Mr. Rowe Warman wanted his provider to know that his hemoglobin was checked yesterday. He's is going to [facility] today to have it rechecked to see if it had dropped anymore. He does have an appt with a  G.I to rule out any blood in his stool.

## 2020-03-30 DIAGNOSIS — Z992 Dependence on renal dialysis: Secondary | ICD-10-CM | POA: Diagnosis not present

## 2020-03-30 DIAGNOSIS — N186 End stage renal disease: Secondary | ICD-10-CM | POA: Diagnosis not present

## 2020-03-30 DIAGNOSIS — N2581 Secondary hyperparathyroidism of renal origin: Secondary | ICD-10-CM | POA: Diagnosis not present

## 2020-04-02 DIAGNOSIS — N186 End stage renal disease: Secondary | ICD-10-CM | POA: Diagnosis not present

## 2020-04-02 DIAGNOSIS — Z992 Dependence on renal dialysis: Secondary | ICD-10-CM | POA: Diagnosis not present

## 2020-04-02 DIAGNOSIS — D649 Anemia, unspecified: Secondary | ICD-10-CM | POA: Diagnosis not present

## 2020-04-02 DIAGNOSIS — N2581 Secondary hyperparathyroidism of renal origin: Secondary | ICD-10-CM | POA: Diagnosis not present

## 2020-04-02 LAB — CBC
Hematocrit: 27.3 % — ABNORMAL LOW (ref 37.5–51.0)
Hemoglobin: 8 g/dL — ABNORMAL LOW (ref 13.0–17.7)
MCH: 27.3 pg (ref 26.6–33.0)
MCHC: 29.3 g/dL — ABNORMAL LOW (ref 31.5–35.7)
MCV: 93 fL (ref 79–97)
Platelets: 215 10*3/uL (ref 150–450)
RBC: 2.93 x10E6/uL — ABNORMAL LOW (ref 4.14–5.80)
RDW: 17.1 % — ABNORMAL HIGH (ref 11.6–15.4)
WBC: 9.2 10*3/uL (ref 3.4–10.8)

## 2020-04-03 DIAGNOSIS — I2510 Atherosclerotic heart disease of native coronary artery without angina pectoris: Secondary | ICD-10-CM | POA: Diagnosis not present

## 2020-04-03 DIAGNOSIS — I132 Hypertensive heart and chronic kidney disease with heart failure and with stage 5 chronic kidney disease, or end stage renal disease: Secondary | ICD-10-CM | POA: Diagnosis not present

## 2020-04-03 DIAGNOSIS — J9621 Acute and chronic respiratory failure with hypoxia: Secondary | ICD-10-CM | POA: Diagnosis not present

## 2020-04-03 DIAGNOSIS — Z515 Encounter for palliative care: Secondary | ICD-10-CM | POA: Diagnosis not present

## 2020-04-03 DIAGNOSIS — J440 Chronic obstructive pulmonary disease with acute lower respiratory infection: Secondary | ICD-10-CM | POA: Diagnosis not present

## 2020-04-03 DIAGNOSIS — D509 Iron deficiency anemia, unspecified: Secondary | ICD-10-CM | POA: Diagnosis not present

## 2020-04-03 DIAGNOSIS — R627 Adult failure to thrive: Secondary | ICD-10-CM | POA: Diagnosis not present

## 2020-04-03 DIAGNOSIS — E1122 Type 2 diabetes mellitus with diabetic chronic kidney disease: Secondary | ICD-10-CM | POA: Diagnosis not present

## 2020-04-03 DIAGNOSIS — R5383 Other fatigue: Secondary | ICD-10-CM | POA: Diagnosis not present

## 2020-04-03 DIAGNOSIS — J189 Pneumonia, unspecified organism: Secondary | ICD-10-CM | POA: Diagnosis not present

## 2020-04-03 DIAGNOSIS — N186 End stage renal disease: Secondary | ICD-10-CM | POA: Diagnosis not present

## 2020-04-03 DIAGNOSIS — I5030 Unspecified diastolic (congestive) heart failure: Secondary | ICD-10-CM | POA: Diagnosis not present

## 2020-04-03 DIAGNOSIS — I480 Paroxysmal atrial fibrillation: Secondary | ICD-10-CM | POA: Diagnosis not present

## 2020-04-03 DIAGNOSIS — Z992 Dependence on renal dialysis: Secondary | ICD-10-CM | POA: Diagnosis not present

## 2020-04-04 DIAGNOSIS — N2581 Secondary hyperparathyroidism of renal origin: Secondary | ICD-10-CM | POA: Diagnosis not present

## 2020-04-04 DIAGNOSIS — Z992 Dependence on renal dialysis: Secondary | ICD-10-CM | POA: Diagnosis not present

## 2020-04-04 DIAGNOSIS — N186 End stage renal disease: Secondary | ICD-10-CM | POA: Diagnosis not present

## 2020-04-05 ENCOUNTER — Other Ambulatory Visit: Payer: Self-pay

## 2020-04-05 DIAGNOSIS — I5030 Unspecified diastolic (congestive) heart failure: Secondary | ICD-10-CM | POA: Diagnosis not present

## 2020-04-05 DIAGNOSIS — E1122 Type 2 diabetes mellitus with diabetic chronic kidney disease: Secondary | ICD-10-CM | POA: Diagnosis not present

## 2020-04-05 DIAGNOSIS — I2510 Atherosclerotic heart disease of native coronary artery without angina pectoris: Secondary | ICD-10-CM | POA: Diagnosis not present

## 2020-04-05 DIAGNOSIS — I132 Hypertensive heart and chronic kidney disease with heart failure and with stage 5 chronic kidney disease, or end stage renal disease: Secondary | ICD-10-CM | POA: Diagnosis not present

## 2020-04-05 DIAGNOSIS — J9621 Acute and chronic respiratory failure with hypoxia: Secondary | ICD-10-CM | POA: Diagnosis not present

## 2020-04-05 DIAGNOSIS — N186 End stage renal disease: Secondary | ICD-10-CM | POA: Diagnosis not present

## 2020-04-05 DIAGNOSIS — J189 Pneumonia, unspecified organism: Secondary | ICD-10-CM | POA: Diagnosis not present

## 2020-04-05 DIAGNOSIS — I480 Paroxysmal atrial fibrillation: Secondary | ICD-10-CM | POA: Diagnosis not present

## 2020-04-05 DIAGNOSIS — J440 Chronic obstructive pulmonary disease with acute lower respiratory infection: Secondary | ICD-10-CM | POA: Diagnosis not present

## 2020-04-05 MED ORDER — OXYCODONE-ACETAMINOPHEN 10-325 MG PO TABS
1.0000 | ORAL_TABLET | ORAL | 0 refills | Status: DC | PRN
Start: 2020-04-05 — End: 2020-05-06

## 2020-04-06 DIAGNOSIS — N2581 Secondary hyperparathyroidism of renal origin: Secondary | ICD-10-CM | POA: Diagnosis not present

## 2020-04-06 DIAGNOSIS — N186 End stage renal disease: Secondary | ICD-10-CM | POA: Diagnosis not present

## 2020-04-06 DIAGNOSIS — Z992 Dependence on renal dialysis: Secondary | ICD-10-CM | POA: Diagnosis not present

## 2020-04-09 DIAGNOSIS — N2581 Secondary hyperparathyroidism of renal origin: Secondary | ICD-10-CM | POA: Diagnosis not present

## 2020-04-09 DIAGNOSIS — Z992 Dependence on renal dialysis: Secondary | ICD-10-CM | POA: Diagnosis not present

## 2020-04-09 DIAGNOSIS — N186 End stage renal disease: Secondary | ICD-10-CM | POA: Diagnosis not present

## 2020-04-10 DIAGNOSIS — J440 Chronic obstructive pulmonary disease with acute lower respiratory infection: Secondary | ICD-10-CM | POA: Diagnosis not present

## 2020-04-10 DIAGNOSIS — J189 Pneumonia, unspecified organism: Secondary | ICD-10-CM | POA: Diagnosis not present

## 2020-04-10 DIAGNOSIS — J9621 Acute and chronic respiratory failure with hypoxia: Secondary | ICD-10-CM | POA: Diagnosis not present

## 2020-04-10 DIAGNOSIS — I480 Paroxysmal atrial fibrillation: Secondary | ICD-10-CM | POA: Diagnosis not present

## 2020-04-10 DIAGNOSIS — J449 Chronic obstructive pulmonary disease, unspecified: Secondary | ICD-10-CM | POA: Diagnosis not present

## 2020-04-10 DIAGNOSIS — E1122 Type 2 diabetes mellitus with diabetic chronic kidney disease: Secondary | ICD-10-CM | POA: Diagnosis not present

## 2020-04-10 DIAGNOSIS — N186 End stage renal disease: Secondary | ICD-10-CM | POA: Diagnosis not present

## 2020-04-10 DIAGNOSIS — I5030 Unspecified diastolic (congestive) heart failure: Secondary | ICD-10-CM | POA: Diagnosis not present

## 2020-04-10 DIAGNOSIS — I739 Peripheral vascular disease, unspecified: Secondary | ICD-10-CM | POA: Diagnosis not present

## 2020-04-10 DIAGNOSIS — I2510 Atherosclerotic heart disease of native coronary artery without angina pectoris: Secondary | ICD-10-CM | POA: Diagnosis not present

## 2020-04-10 DIAGNOSIS — I132 Hypertensive heart and chronic kidney disease with heart failure and with stage 5 chronic kidney disease, or end stage renal disease: Secondary | ICD-10-CM | POA: Diagnosis not present

## 2020-04-10 DIAGNOSIS — M1990 Unspecified osteoarthritis, unspecified site: Secondary | ICD-10-CM | POA: Diagnosis not present

## 2020-04-11 ENCOUNTER — Telehealth: Payer: Self-pay | Admitting: Internal Medicine

## 2020-04-11 DIAGNOSIS — Z992 Dependence on renal dialysis: Secondary | ICD-10-CM | POA: Diagnosis not present

## 2020-04-11 DIAGNOSIS — N186 End stage renal disease: Secondary | ICD-10-CM | POA: Diagnosis not present

## 2020-04-11 DIAGNOSIS — N2581 Secondary hyperparathyroidism of renal origin: Secondary | ICD-10-CM | POA: Diagnosis not present

## 2020-04-11 NOTE — Telephone Encounter (Signed)
Spoke with patient's daughter, [name].  She would like to take pt to PCP office to repeat.  She is going to call them tomorrow.

## 2020-04-11 NOTE — Telephone Encounter (Signed)
Patient is returning call for results. 

## 2020-04-13 DIAGNOSIS — Z992 Dependence on renal dialysis: Secondary | ICD-10-CM | POA: Diagnosis not present

## 2020-04-13 DIAGNOSIS — N2581 Secondary hyperparathyroidism of renal origin: Secondary | ICD-10-CM | POA: Diagnosis not present

## 2020-04-13 DIAGNOSIS — N186 End stage renal disease: Secondary | ICD-10-CM | POA: Diagnosis not present

## 2020-04-15 ENCOUNTER — Other Ambulatory Visit: Payer: Self-pay | Admitting: Family Medicine

## 2020-04-15 ENCOUNTER — Other Ambulatory Visit: Payer: Self-pay

## 2020-04-15 ENCOUNTER — Other Ambulatory Visit: Payer: Medicare HMO

## 2020-04-15 DIAGNOSIS — J9621 Acute and chronic respiratory failure with hypoxia: Secondary | ICD-10-CM | POA: Diagnosis not present

## 2020-04-15 DIAGNOSIS — J189 Pneumonia, unspecified organism: Secondary | ICD-10-CM | POA: Diagnosis not present

## 2020-04-15 DIAGNOSIS — I480 Paroxysmal atrial fibrillation: Secondary | ICD-10-CM | POA: Diagnosis not present

## 2020-04-15 DIAGNOSIS — I2510 Atherosclerotic heart disease of native coronary artery without angina pectoris: Secondary | ICD-10-CM | POA: Diagnosis not present

## 2020-04-15 DIAGNOSIS — I132 Hypertensive heart and chronic kidney disease with heart failure and with stage 5 chronic kidney disease, or end stage renal disease: Secondary | ICD-10-CM | POA: Diagnosis not present

## 2020-04-15 DIAGNOSIS — D619 Aplastic anemia, unspecified: Secondary | ICD-10-CM

## 2020-04-15 DIAGNOSIS — N186 End stage renal disease: Secondary | ICD-10-CM | POA: Diagnosis not present

## 2020-04-15 DIAGNOSIS — J440 Chronic obstructive pulmonary disease with acute lower respiratory infection: Secondary | ICD-10-CM | POA: Diagnosis not present

## 2020-04-15 DIAGNOSIS — E1122 Type 2 diabetes mellitus with diabetic chronic kidney disease: Secondary | ICD-10-CM | POA: Diagnosis not present

## 2020-04-15 DIAGNOSIS — I5030 Unspecified diastolic (congestive) heart failure: Secondary | ICD-10-CM | POA: Diagnosis not present

## 2020-04-15 DIAGNOSIS — D500 Iron deficiency anemia secondary to blood loss (chronic): Secondary | ICD-10-CM

## 2020-04-15 LAB — CBC WITH DIFFERENTIAL/PLATELET
Absolute Monocytes: 730 cells/uL (ref 200–950)
Basophils Absolute: 26 cells/uL (ref 0–200)
Basophils Relative: 0.3 %
Eosinophils Absolute: 590 cells/uL — ABNORMAL HIGH (ref 15–500)
Eosinophils Relative: 6.7 %
HCT: 31.4 % — ABNORMAL LOW (ref 38.5–50.0)
Hemoglobin: 9.9 g/dL — ABNORMAL LOW (ref 13.2–17.1)
Lymphs Abs: 2763 cells/uL (ref 850–3900)
MCH: 28.4 pg (ref 27.0–33.0)
MCHC: 31.5 g/dL — ABNORMAL LOW (ref 32.0–36.0)
MCV: 90.2 fL (ref 80.0–100.0)
MPV: 9.7 fL (ref 7.5–12.5)
Monocytes Relative: 8.3 %
Neutro Abs: 4690 cells/uL (ref 1500–7800)
Neutrophils Relative %: 53.3 %
Platelets: 166 10*3/uL (ref 140–400)
RBC: 3.48 10*6/uL — ABNORMAL LOW (ref 4.20–5.80)
RDW: 17 % — ABNORMAL HIGH (ref 11.0–15.0)
Total Lymphocyte: 31.4 %
WBC: 8.8 10*3/uL (ref 3.8–10.8)

## 2020-04-16 DIAGNOSIS — N2581 Secondary hyperparathyroidism of renal origin: Secondary | ICD-10-CM | POA: Diagnosis not present

## 2020-04-16 DIAGNOSIS — N186 End stage renal disease: Secondary | ICD-10-CM | POA: Diagnosis not present

## 2020-04-16 DIAGNOSIS — Z992 Dependence on renal dialysis: Secondary | ICD-10-CM | POA: Diagnosis not present

## 2020-04-18 DIAGNOSIS — N186 End stage renal disease: Secondary | ICD-10-CM | POA: Diagnosis not present

## 2020-04-18 DIAGNOSIS — N2581 Secondary hyperparathyroidism of renal origin: Secondary | ICD-10-CM | POA: Diagnosis not present

## 2020-04-18 DIAGNOSIS — Z992 Dependence on renal dialysis: Secondary | ICD-10-CM | POA: Diagnosis not present

## 2020-04-19 ENCOUNTER — Ambulatory Visit (INDEPENDENT_AMBULATORY_CARE_PROVIDER_SITE_OTHER): Payer: Medicare HMO | Admitting: Family Medicine

## 2020-04-19 ENCOUNTER — Other Ambulatory Visit: Payer: Self-pay

## 2020-04-19 VITALS — BP 130/60 | HR 76 | Temp 97.7°F | Ht 70.0 in | Wt 139.0 lb

## 2020-04-19 DIAGNOSIS — D500 Iron deficiency anemia secondary to blood loss (chronic): Secondary | ICD-10-CM | POA: Diagnosis not present

## 2020-04-19 DIAGNOSIS — M7021 Olecranon bursitis, right elbow: Secondary | ICD-10-CM | POA: Diagnosis not present
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
## 2020-04-20 DIAGNOSIS — N2581 Secondary hyperparathyroidism of renal origin: Secondary | ICD-10-CM | POA: Diagnosis not present

## 2020-04-20 DIAGNOSIS — N186 End stage renal disease: Secondary | ICD-10-CM | POA: Diagnosis not present

## 2020-04-20 DIAGNOSIS — Z992 Dependence on renal dialysis: Secondary | ICD-10-CM | POA: Diagnosis not present

## 2020-04-23 DIAGNOSIS — Z992 Dependence on renal dialysis: Secondary | ICD-10-CM | POA: Diagnosis not present

## 2020-04-23 DIAGNOSIS — N2581 Secondary hyperparathyroidism of renal origin: Secondary | ICD-10-CM | POA: Diagnosis not present

## 2020-04-23 DIAGNOSIS — N186 End stage renal disease: Secondary | ICD-10-CM | POA: Diagnosis not present

## 2020-04-24 DIAGNOSIS — I2510 Atherosclerotic heart disease of native coronary artery without angina pectoris: Secondary | ICD-10-CM | POA: Diagnosis not present

## 2020-04-24 DIAGNOSIS — I480 Paroxysmal atrial fibrillation: Secondary | ICD-10-CM | POA: Diagnosis not present

## 2020-04-24 DIAGNOSIS — E1122 Type 2 diabetes mellitus with diabetic chronic kidney disease: Secondary | ICD-10-CM | POA: Diagnosis not present

## 2020-04-24 DIAGNOSIS — J9621 Acute and chronic respiratory failure with hypoxia: Secondary | ICD-10-CM | POA: Diagnosis not present

## 2020-04-24 DIAGNOSIS — I132 Hypertensive heart and chronic kidney disease with heart failure and with stage 5 chronic kidney disease, or end stage renal disease: Secondary | ICD-10-CM | POA: Diagnosis not present

## 2020-04-24 DIAGNOSIS — I5030 Unspecified diastolic (congestive) heart failure: Secondary | ICD-10-CM | POA: Diagnosis not present

## 2020-04-24 DIAGNOSIS — J440 Chronic obstructive pulmonary disease with acute lower respiratory infection: Secondary | ICD-10-CM | POA: Diagnosis not present

## 2020-04-24 DIAGNOSIS — N186 End stage renal disease: Secondary | ICD-10-CM | POA: Diagnosis not present

## 2020-04-24 DIAGNOSIS — J189 Pneumonia, unspecified organism: Secondary | ICD-10-CM | POA: Diagnosis not present

## 2020-04-25 DIAGNOSIS — N2581 Secondary hyperparathyroidism of renal origin: Secondary | ICD-10-CM | POA: Diagnosis not present

## 2020-04-25 DIAGNOSIS — N186 End stage renal disease: Secondary | ICD-10-CM | POA: Diagnosis not present

## 2020-04-25 DIAGNOSIS — Z992 Dependence on renal dialysis: Secondary | ICD-10-CM | POA: Diagnosis not present

## 2020-04-27 DIAGNOSIS — N186 End stage renal disease: Secondary | ICD-10-CM | POA: Diagnosis not present

## 2020-04-27 DIAGNOSIS — N2581 Secondary hyperparathyroidism of renal origin: Secondary | ICD-10-CM | POA: Diagnosis not present

## 2020-04-27 DIAGNOSIS — Z992 Dependence on renal dialysis: Secondary | ICD-10-CM | POA: Diagnosis not present

## 2020-04-28 DIAGNOSIS — N186 End stage renal disease: Secondary | ICD-10-CM | POA: Diagnosis not present

## 2020-04-28 DIAGNOSIS — E1122 Type 2 diabetes mellitus with diabetic chronic kidney disease: Secondary | ICD-10-CM | POA: Diagnosis not present

## 2020-04-28 DIAGNOSIS — Z992 Dependence on renal dialysis: Secondary | ICD-10-CM | POA: Diagnosis not present

## 2020-04-29 DIAGNOSIS — I509 Heart failure, unspecified: Secondary | ICD-10-CM | POA: Diagnosis not present

## 2020-04-29 DIAGNOSIS — I2510 Atherosclerotic heart disease of native coronary artery without angina pectoris: Secondary | ICD-10-CM | POA: Diagnosis not present

## 2020-04-29 DIAGNOSIS — J449 Chronic obstructive pulmonary disease, unspecified: Secondary | ICD-10-CM | POA: Diagnosis not present

## 2020-04-29 DIAGNOSIS — J9621 Acute and chronic respiratory failure with hypoxia: Secondary | ICD-10-CM | POA: Diagnosis not present

## 2020-04-29 DIAGNOSIS — J849 Interstitial pulmonary disease, unspecified: Secondary | ICD-10-CM | POA: Diagnosis not present

## 2020-04-30 DIAGNOSIS — E8770 Fluid overload, unspecified: Secondary | ICD-10-CM | POA: Diagnosis not present

## 2020-04-30 DIAGNOSIS — N186 End stage renal disease: Secondary | ICD-10-CM | POA: Diagnosis not present

## 2020-04-30 DIAGNOSIS — N2581 Secondary hyperparathyroidism of renal origin: Secondary | ICD-10-CM | POA: Diagnosis not present

## 2020-04-30 DIAGNOSIS — Z992 Dependence on renal dialysis: Secondary | ICD-10-CM | POA: Diagnosis not present

## 2020-05-02 ENCOUNTER — Other Ambulatory Visit: Payer: Self-pay | Admitting: Family Medicine

## 2020-05-02 DIAGNOSIS — N186 End stage renal disease: Secondary | ICD-10-CM | POA: Diagnosis not present

## 2020-05-02 DIAGNOSIS — Z992 Dependence on renal dialysis: Secondary | ICD-10-CM | POA: Diagnosis not present

## 2020-05-02 DIAGNOSIS — N2581 Secondary hyperparathyroidism of renal origin: Secondary | ICD-10-CM | POA: Diagnosis not present

## 2020-05-02 DIAGNOSIS — E8770 Fluid overload, unspecified: Secondary | ICD-10-CM | POA: Diagnosis not present

## 2020-05-02 NOTE — Telephone Encounter (Signed)
Ok to refill 

## 2020-05-03 ENCOUNTER — Other Ambulatory Visit: Payer: Self-pay

## 2020-05-03 ENCOUNTER — Other Ambulatory Visit: Payer: Medicare HMO

## 2020-05-03 DIAGNOSIS — D500 Iron deficiency anemia secondary to blood loss (chronic): Secondary | ICD-10-CM | POA: Diagnosis not present

## 2020-05-03 NOTE — Addendum Note (Signed)
Addended by: Saundra Shelling on: 05/03/2020 09:57 AM   Modules accepted: Orders

## 2020-05-03 NOTE — Addendum Note (Signed)
Addended by: Saundra Shelling on: 05/03/2020 09:58 AM   Modules accepted: Orders

## 2020-05-04 DIAGNOSIS — E8770 Fluid overload, unspecified: Secondary | ICD-10-CM | POA: Diagnosis not present

## 2020-05-04 DIAGNOSIS — N2581 Secondary hyperparathyroidism of renal origin: Secondary | ICD-10-CM | POA: Diagnosis not present

## 2020-05-04 DIAGNOSIS — Z992 Dependence on renal dialysis: Secondary | ICD-10-CM | POA: Diagnosis not present

## 2020-05-04 DIAGNOSIS — N186 End stage renal disease: Secondary | ICD-10-CM | POA: Diagnosis not present

## 2020-05-04 LAB — CBC WITH DIFFERENTIAL/PLATELET
Absolute Monocytes: 921 cells/uL (ref 200–950)
Basophils Absolute: 20 cells/uL (ref 0–200)
Basophils Relative: 0.2 %
Eosinophils Absolute: 88 cells/uL (ref 15–500)
Eosinophils Relative: 0.9 %
HCT: 37.1 % — ABNORMAL LOW (ref 38.5–50.0)
Hemoglobin: 11.5 g/dL — ABNORMAL LOW (ref 13.2–17.1)
Lymphs Abs: 1764 cells/uL (ref 850–3900)
MCH: 27.9 pg (ref 27.0–33.0)
MCHC: 31 g/dL — ABNORMAL LOW (ref 32.0–36.0)
MCV: 90 fL (ref 80.0–100.0)
MPV: 10.9 fL (ref 7.5–12.5)
Monocytes Relative: 9.4 %
Neutro Abs: 7007 cells/uL (ref 1500–7800)
Neutrophils Relative %: 71.5 %
Platelets: 113 10*3/uL — ABNORMAL LOW (ref 140–400)
RBC: 4.12 10*6/uL — ABNORMAL LOW (ref 4.20–5.80)
RDW: 16.8 % — ABNORMAL HIGH (ref 11.0–15.0)
Total Lymphocyte: 18 %
WBC: 9.8 10*3/uL (ref 3.8–10.8)

## 2020-05-06 ENCOUNTER — Other Ambulatory Visit: Payer: Self-pay

## 2020-05-07 ENCOUNTER — Other Ambulatory Visit: Payer: Self-pay

## 2020-05-07 ENCOUNTER — Telehealth: Payer: Self-pay

## 2020-05-07 DIAGNOSIS — N2581 Secondary hyperparathyroidism of renal origin: Secondary | ICD-10-CM | POA: Diagnosis not present

## 2020-05-07 DIAGNOSIS — N186 End stage renal disease: Secondary | ICD-10-CM | POA: Diagnosis not present

## 2020-05-07 DIAGNOSIS — E8770 Fluid overload, unspecified: Secondary | ICD-10-CM | POA: Diagnosis not present

## 2020-05-07 DIAGNOSIS — Z992 Dependence on renal dialysis: Secondary | ICD-10-CM | POA: Diagnosis not present

## 2020-05-07 DIAGNOSIS — D500 Iron deficiency anemia secondary to blood loss (chronic): Secondary | ICD-10-CM

## 2020-05-07 MED ORDER — ASPIRIN EC 81 MG PO TBEC: 81.0000 mg | DELAYED_RELEASE_TABLET | Freq: Every day | ORAL | 11 refills | Status: AC

## 2020-05-07 MED ORDER — OXYCODONE-ACETAMINOPHEN 10-325 MG PO TABS: 1.0000 | ORAL_TABLET | ORAL | 0 refills | Status: DC | PRN

## 2020-05-07 NOTE — Telephone Encounter (Signed)
Spoke with [name] regarding pt's lab results

## 2020-05-07 NOTE — Progress Notes (Signed)
cbc

## 2020-05-09 DIAGNOSIS — N2581 Secondary hyperparathyroidism of renal origin: Secondary | ICD-10-CM | POA: Diagnosis not present

## 2020-05-09 DIAGNOSIS — Z992 Dependence on renal dialysis: Secondary | ICD-10-CM | POA: Diagnosis not present

## 2020-05-09 DIAGNOSIS — N186 End stage renal disease: Secondary | ICD-10-CM | POA: Diagnosis not present

## 2020-05-09 DIAGNOSIS — E8770 Fluid overload, unspecified: Secondary | ICD-10-CM | POA: Diagnosis not present

## 2020-05-11 DIAGNOSIS — N2581 Secondary hyperparathyroidism of renal origin: Secondary | ICD-10-CM | POA: Diagnosis not present

## 2020-05-11 DIAGNOSIS — J449 Chronic obstructive pulmonary disease, unspecified: Secondary | ICD-10-CM | POA: Diagnosis not present

## 2020-05-11 DIAGNOSIS — E8770 Fluid overload, unspecified: Secondary | ICD-10-CM | POA: Diagnosis not present

## 2020-05-11 DIAGNOSIS — Z992 Dependence on renal dialysis: Secondary | ICD-10-CM | POA: Diagnosis not present

## 2020-05-11 DIAGNOSIS — N186 End stage renal disease: Secondary | ICD-10-CM | POA: Diagnosis not present

## 2020-05-11 DIAGNOSIS — I739 Peripheral vascular disease, unspecified: Secondary | ICD-10-CM | POA: Diagnosis not present

## 2020-05-11 DIAGNOSIS — M1990 Unspecified osteoarthritis, unspecified site: Secondary | ICD-10-CM | POA: Diagnosis not present

## 2020-05-14 DIAGNOSIS — E8770 Fluid overload, unspecified: Secondary | ICD-10-CM | POA: Diagnosis not present

## 2020-05-14 DIAGNOSIS — N2581 Secondary hyperparathyroidism of renal origin: Secondary | ICD-10-CM | POA: Diagnosis not present

## 2020-05-14 DIAGNOSIS — N186 End stage renal disease: Secondary | ICD-10-CM | POA: Diagnosis not present

## 2020-05-14 DIAGNOSIS — Z992 Dependence on renal dialysis: Secondary | ICD-10-CM | POA: Diagnosis not present

## 2020-05-15 DIAGNOSIS — N186 End stage renal disease: Secondary | ICD-10-CM | POA: Diagnosis not present

## 2020-05-15 DIAGNOSIS — K4091 Unilateral inguinal hernia, without obstruction or gangrene, recurrent: Secondary | ICD-10-CM | POA: Diagnosis not present

## 2020-05-15 DIAGNOSIS — Z992 Dependence on renal dialysis: Secondary | ICD-10-CM | POA: Diagnosis not present

## 2020-05-15 DIAGNOSIS — Z515 Encounter for palliative care: Secondary | ICD-10-CM | POA: Diagnosis not present

## 2020-05-16 DIAGNOSIS — N186 End stage renal disease: Secondary | ICD-10-CM | POA: Diagnosis not present

## 2020-05-16 DIAGNOSIS — Z992 Dependence on renal dialysis: Secondary | ICD-10-CM | POA: Diagnosis not present

## 2020-05-16 DIAGNOSIS — N2581 Secondary hyperparathyroidism of renal origin: Secondary | ICD-10-CM | POA: Diagnosis not present

## 2020-05-16 DIAGNOSIS — E8770 Fluid overload, unspecified: Secondary | ICD-10-CM | POA: Diagnosis not present

## 2020-05-17 DIAGNOSIS — T82858A Stenosis of vascular prosthetic devices, implants and grafts, initial encounter: Secondary | ICD-10-CM | POA: Diagnosis not present

## 2020-05-17 DIAGNOSIS — N2581 Secondary hyperparathyroidism of renal origin: Secondary | ICD-10-CM | POA: Diagnosis not present

## 2020-05-17 DIAGNOSIS — E8770 Fluid overload, unspecified: Secondary | ICD-10-CM | POA: Diagnosis not present

## 2020-05-17 DIAGNOSIS — I871 Compression of vein: Secondary | ICD-10-CM | POA: Diagnosis not present

## 2020-05-17 DIAGNOSIS — N186 End stage renal disease: Secondary | ICD-10-CM | POA: Diagnosis not present

## 2020-05-17 DIAGNOSIS — Z992 Dependence on renal dialysis: Secondary | ICD-10-CM | POA: Diagnosis not present

## 2020-05-18 DIAGNOSIS — E8770 Fluid overload, unspecified: Secondary | ICD-10-CM | POA: Diagnosis not present

## 2020-05-18 DIAGNOSIS — N2581 Secondary hyperparathyroidism of renal origin: Secondary | ICD-10-CM | POA: Diagnosis not present

## 2020-05-18 DIAGNOSIS — N186 End stage renal disease: Secondary | ICD-10-CM | POA: Diagnosis not present

## 2020-05-18 DIAGNOSIS — Z992 Dependence on renal dialysis: Secondary | ICD-10-CM | POA: Diagnosis not present

## 2020-05-20 ENCOUNTER — Other Ambulatory Visit (HOSPITAL_COMMUNITY): Payer: Self-pay | Admitting: Physician Assistant

## 2020-05-20 ENCOUNTER — Other Ambulatory Visit (HOSPITAL_COMMUNITY): Payer: Self-pay | Admitting: Nephrology

## 2020-05-20 DIAGNOSIS — N186 End stage renal disease: Secondary | ICD-10-CM

## 2020-05-21 ENCOUNTER — Ambulatory Visit (HOSPITAL_COMMUNITY)
Admission: RE | Admit: 2020-05-21 | Discharge: 2020-05-21 | Disposition: A | Discharge status: Home / Self Care | Payer: Medicare HMO | Source: Ambulatory Visit | Attending: Nephrology | Admitting: Nephrology

## 2020-05-21 ENCOUNTER — Other Ambulatory Visit (HOSPITAL_COMMUNITY): Payer: Self-pay | Admitting: Nephrology

## 2020-05-21 ENCOUNTER — Other Ambulatory Visit: Payer: Self-pay

## 2020-05-21 ENCOUNTER — Telehealth: Payer: Self-pay | Admitting: Vascular Surgery

## 2020-05-21 DIAGNOSIS — Z888 Allergy status to other drugs, medicaments and biological substances status: Secondary | ICD-10-CM | POA: Insufficient documentation

## 2020-05-21 DIAGNOSIS — F17210 Nicotine dependence, cigarettes, uncomplicated: Secondary | ICD-10-CM | POA: Diagnosis not present

## 2020-05-21 DIAGNOSIS — Z91014 Allergy to mammalian meats: Secondary | ICD-10-CM | POA: Insufficient documentation

## 2020-05-21 DIAGNOSIS — I4891 Unspecified atrial fibrillation: Secondary | ICD-10-CM | POA: Insufficient documentation

## 2020-05-21 DIAGNOSIS — N186 End stage renal disease: Secondary | ICD-10-CM

## 2020-05-21 DIAGNOSIS — E785 Hyperlipidemia, unspecified: Secondary | ICD-10-CM | POA: Insufficient documentation

## 2020-05-21 DIAGNOSIS — T82510A Breakdown (mechanical) of surgically created arteriovenous fistula, initial encounter: Secondary | ICD-10-CM | POA: Diagnosis not present

## 2020-05-21 DIAGNOSIS — Z885 Allergy status to narcotic agent status: Secondary | ICD-10-CM | POA: Insufficient documentation

## 2020-05-21 DIAGNOSIS — Y841 Kidney dialysis as the cause of abnormal reaction of the patient, or of later complication, without mention of misadventure at the time of the procedure: Secondary | ICD-10-CM | POA: Insufficient documentation

## 2020-05-21 DIAGNOSIS — E1122 Type 2 diabetes mellitus with diabetic chronic kidney disease: Secondary | ICD-10-CM | POA: Insufficient documentation

## 2020-05-21 DIAGNOSIS — Z79899 Other long term (current) drug therapy: Secondary | ICD-10-CM | POA: Diagnosis not present

## 2020-05-21 DIAGNOSIS — D631 Anemia in chronic kidney disease: Secondary | ICD-10-CM | POA: Diagnosis not present

## 2020-05-21 DIAGNOSIS — Z7982 Long term (current) use of aspirin: Secondary | ICD-10-CM | POA: Insufficient documentation

## 2020-05-21 DIAGNOSIS — Z992 Dependence on renal dialysis: Secondary | ICD-10-CM | POA: Diagnosis not present

## 2020-05-21 DIAGNOSIS — Z91013 Allergy to seafood: Secondary | ICD-10-CM | POA: Insufficient documentation

## 2020-05-21 DIAGNOSIS — Z4901 Encounter for fitting and adjustment of extracorporeal dialysis catheter: Secondary | ICD-10-CM | POA: Diagnosis not present

## 2020-05-21 HISTORY — PX: IR FLUORO GUIDE CV LINE RIGHT: IMG2283

## 2020-05-21 HISTORY — PX: IR US GUIDE VASC ACCESS RIGHT: IMG2390

## 2020-05-21 LAB — BASIC METABOLIC PANEL WITH GFR
Anion gap: 19 — ABNORMAL HIGH (ref 5–15)
BUN: 69 mg/dL — ABNORMAL HIGH (ref 8–23)
CO2: 19 mmol/L — ABNORMAL LOW (ref 22–32)
Calcium: 7.8 mg/dL — ABNORMAL LOW (ref 8.9–10.3)
Chloride: 99 mmol/L (ref 98–111)
Creatinine, Ser: 10.66 mg/dL — ABNORMAL HIGH (ref 0.61–1.24)
GFR, Estimated: 5 mL/min — ABNORMAL LOW
Glucose, Bld: 87 mg/dL (ref 70–99)
Potassium: 5.3 mmol/L — ABNORMAL HIGH (ref 3.5–5.1)
Sodium: 137 mmol/L (ref 135–145)

## 2020-05-21 MED ORDER — FENTANYL CITRATE (PF) 100 MCG/2ML IJ SOLN
INTRAMUSCULAR | Status: AC
  Filled 2020-05-21: qty 2

## 2020-05-21 MED ORDER — HEPARIN SODIUM (PORCINE) 1000 UNIT/ML IJ SOLN
INTRAMUSCULAR | Status: AC
  Filled 2020-05-21: qty 1

## 2020-05-21 MED ORDER — GELATIN ABSORBABLE 12-7 MM EX MISC
CUTANEOUS | Status: AC | PRN
  Administered 2020-05-21: 1 via TOPICAL

## 2020-05-21 MED ORDER — FENTANYL CITRATE (PF) 100 MCG/2ML IJ SOLN
INTRAMUSCULAR | Status: AC | PRN
Start: 2020-05-21 — End: 2020-05-21
  Administered 2020-05-21: 25 ug via INTRAVENOUS

## 2020-05-21 MED ORDER — MIDAZOLAM HCL 2 MG/2ML IJ SOLN
INTRAMUSCULAR | Status: AC | PRN
  Administered 2020-05-21: 0.5 mg via INTRAVENOUS

## 2020-05-21 MED ORDER — CEFAZOLIN SODIUM-DEXTROSE 2-4 GM/100ML-% IV SOLN: 2.0000 g | Freq: Once | INTRAVENOUS | Status: AC

## 2020-05-21 MED ORDER — HEPARIN SODIUM (PORCINE) 1000 UNIT/ML IJ SOLN
INTRAMUSCULAR | Status: AC | PRN
  Administered 2020-05-21: 1000 [IU]

## 2020-05-21 MED ORDER — MIDAZOLAM HCL 2 MG/2ML IJ SOLN
INTRAMUSCULAR | Status: AC
  Filled 2020-05-21: qty 2

## 2020-05-21 MED ORDER — LIDOCAINE-EPINEPHRINE 1 %-1:100000 IJ SOLN
INTRAMUSCULAR | Status: AC
  Filled 2020-05-21: qty 1

## 2020-05-21 MED ORDER — CEFAZOLIN SODIUM-DEXTROSE 2-4 GM/100ML-% IV SOLN
INTRAVENOUS | Status: AC
  Administered 2020-05-21: 2 g via INTRAVENOUS
  Filled 2020-05-21: qty 100

## 2020-05-21 MED ORDER — SODIUM CHLORIDE 0.9 % IV SOLN: INTRAVENOUS | Status: DC

## 2020-05-21 MED ORDER — LIDOCAINE HCL 1 % IJ SOLN
INTRAMUSCULAR | Status: AC | PRN
  Administered 2020-05-21: 20 mL via INTRADERMAL

## 2020-05-21 MED ORDER — GELATIN ABSORBABLE 12-7 MM EX MISC
CUTANEOUS | Status: AC
  Filled 2020-05-21: qty 1

## 2020-05-21 MED ORDER — LIDOCAINE HCL 1 % IJ SOLN
INTRAMUSCULAR | Status: AC
  Filled 2020-05-21: qty 20

## 2020-05-21 NOTE — Discharge Instructions (Signed)
Central Line Dialysis Access Placement, Care After This sheet gives you information about how to care for yourself after your procedure. Your health care provider may also give you more specific instructions. If you have problems or questions, contact your health care provider. What can I expect after the procedure? After the procedure, it is common to have:  Mild pain or discomfort.  Mild redness, swelling, or bruising around your incision.  A small amount of blood or clear fluid coming from your incision. Follow these instructions at home: Incision care   Follow instructions from your health care provider about how to take care of your incision. Make sure you: ? Wash your hands with soap and water before you change your bandage (dressing). If soap and water are not available, use hand sanitizer. ? Change your dressing as told by your health care provider. ? Leave stitches (sutures) in place.  Check your incision area every day for signs of infection. Check for: ? More redness, swelling, or pain. ? More fluid or blood. ? Warmth. ? Pus or a bad smell.  If directed, put heat on the catheter site as often as told by your health care provider. Use the heat source that your health care provider recommends, such as a moist heat pack or a heating pad. ? Place a towel between your skin and the heat source. ? Leave the heat on for 20-30 minutes. ? Remove the heat if your skin turns bright red. This is especially important if you are unable to feel pain, heat, or cold. You may have a greater risk of getting burned.  If directed, put ice on the catheter site: ? Put ice in a plastic bag. ? Place a towel between your skin and the bag. ? Leave the ice on for 20 minutes, 2-3 times a day. Medicines  Take over-the-counter and prescription medicines only as told by your health care provider.  If you were prescribed an antibiotic medicine, use it as told by your health care provider. Do not stop  using the antibiotic even if you start to feel better. Activity  Return to your normal activities as told by your health care provider. Ask your health care provider what activities are safe for you.  Do not lift anything that is heavier than 10 lb (4.5 kg) until your health care provider says that this is safe. Driving  Do not drive for 24 hours if you were given a medicine to help you relax (sedative) during your procedure.  Do not drive or use heavy machinery while taking prescription pain medicine. Lifestyle  Limit alcohol intake to no more than 1 drink a day for nonpregnant women and 2 drinks a day for men. One drink equals 12 oz of beer, 5 oz of wine, or 1 oz of hard liquor.  Do not use any products that contain nicotine or tobacco, such as cigarettes and e-cigarettes. If you need help quitting, ask your health care provider. General instructions  Do not take baths or showers, swim, or use a hot tub until your health care provider approves. You may only be allowed to take sponge baths for bathing.  Wear compression stockings as told by your health care provider. These stockings help to prevent blood clots and reduce swelling in your legs.  Follow instructions from your health care provider about eating or drinking restrictions.  Keep all follow-up visits as told by your health care provider. This is important. Contact a health care provider if:  Your  catheter gets pulled out of place.  Your catheter site becomes itchy.  You develop a rash around your catheter site.  You have more redness, swelling, or pain around your incision.  You have more fluid or blood coming from your incision.  Your incision area feels warm to the touch.  You have pus or a bad smell coming from your incision.  You have a fever. Get help right away if:  You become light-headed or dizzy.  You faint.  You have difficulty breathing.  Your catheter gets pulled out completely. This  information is not intended to replace advice given to you by your health care provider. Make sure you discuss any questions you have with your health care provider. Document Revised: 05/28/2017 Document Reviewed: 03/09/2016 Elsevier Patient Education  2020 [employer].
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
## 2020-05-21 NOTE — Procedures (Signed)
Interventional Radiology Procedure:   Indications: ESRD and needs new dialysis access  Procedure: Tunneled HD catheter placement  Findings: Right jugular, tip at SVC/RA junction  Complications: None     EBL: less than 10 ml  Plan: Discharge to home/dialysis in 1 hour   Travontae Freiberger R. Anselm Pancoast, MD  Pager: [phone]

## 2020-05-22 DIAGNOSIS — N186 End stage renal disease: Secondary | ICD-10-CM | POA: Diagnosis not present

## 2020-05-22 DIAGNOSIS — Z992 Dependence on renal dialysis: Secondary | ICD-10-CM | POA: Diagnosis not present

## 2020-05-22 DIAGNOSIS — N2581 Secondary hyperparathyroidism of renal origin: Secondary | ICD-10-CM | POA: Diagnosis not present

## 2020-05-22 DIAGNOSIS — E8770 Fluid overload, unspecified: Secondary | ICD-10-CM | POA: Diagnosis not present

## 2020-05-25 DIAGNOSIS — N186 End stage renal disease: Secondary | ICD-10-CM | POA: Diagnosis not present

## 2020-05-25 DIAGNOSIS — E8770 Fluid overload, unspecified: Secondary | ICD-10-CM | POA: Diagnosis not present

## 2020-05-25 DIAGNOSIS — Z992 Dependence on renal dialysis: Secondary | ICD-10-CM | POA: Diagnosis not present

## 2020-05-25 DIAGNOSIS — N2581 Secondary hyperparathyroidism of renal origin: Secondary | ICD-10-CM | POA: Diagnosis not present

## 2020-05-28 DIAGNOSIS — Z992 Dependence on renal dialysis: Secondary | ICD-10-CM | POA: Diagnosis not present

## 2020-05-28 DIAGNOSIS — E8770 Fluid overload, unspecified: Secondary | ICD-10-CM | POA: Diagnosis not present

## 2020-05-28 DIAGNOSIS — E1122 Type 2 diabetes mellitus with diabetic chronic kidney disease: Secondary | ICD-10-CM | POA: Diagnosis not present

## 2020-05-28 DIAGNOSIS — N2581 Secondary hyperparathyroidism of renal origin: Secondary | ICD-10-CM | POA: Diagnosis not present

## 2020-05-28 DIAGNOSIS — N186 End stage renal disease: Secondary | ICD-10-CM | POA: Diagnosis not present

## 2020-05-30 DIAGNOSIS — N2581 Secondary hyperparathyroidism of renal origin: Secondary | ICD-10-CM | POA: Diagnosis not present

## 2020-05-30 DIAGNOSIS — N186 End stage renal disease: Secondary | ICD-10-CM | POA: Diagnosis not present

## 2020-05-30 DIAGNOSIS — Z992 Dependence on renal dialysis: Secondary | ICD-10-CM | POA: Diagnosis not present

## 2020-06-01 DIAGNOSIS — Z992 Dependence on renal dialysis: Secondary | ICD-10-CM | POA: Diagnosis not present

## 2020-06-01 DIAGNOSIS — N186 End stage renal disease: Secondary | ICD-10-CM | POA: Diagnosis not present

## 2020-06-01 DIAGNOSIS — N2581 Secondary hyperparathyroidism of renal origin: Secondary | ICD-10-CM | POA: Diagnosis not present

## 2020-06-01 DIAGNOSIS — R770 Abnormality of albumin: Secondary | ICD-10-CM | POA: Diagnosis not present

## 2020-06-04 DIAGNOSIS — N186 End stage renal disease: Secondary | ICD-10-CM | POA: Diagnosis not present

## 2020-06-04 DIAGNOSIS — Z992 Dependence on renal dialysis: Secondary | ICD-10-CM | POA: Diagnosis not present

## 2020-06-04 DIAGNOSIS — N2581 Secondary hyperparathyroidism of renal origin: Secondary | ICD-10-CM | POA: Diagnosis not present

## 2020-06-06 ENCOUNTER — Other Ambulatory Visit: Payer: Self-pay

## 2020-06-06 DIAGNOSIS — Z992 Dependence on renal dialysis: Secondary | ICD-10-CM | POA: Diagnosis not present

## 2020-06-06 DIAGNOSIS — N2581 Secondary hyperparathyroidism of renal origin: Secondary | ICD-10-CM | POA: Diagnosis not present

## 2020-06-06 DIAGNOSIS — N186 End stage renal disease: Secondary | ICD-10-CM | POA: Diagnosis not present
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
## 2020-06-07 ENCOUNTER — Ambulatory Visit (INDEPENDENT_AMBULATORY_CARE_PROVIDER_SITE_OTHER): Payer: Medicare HMO | Admitting: Podiatry

## 2020-06-07 ENCOUNTER — Ambulatory Visit (INDEPENDENT_AMBULATORY_CARE_PROVIDER_SITE_OTHER): Payer: Medicare HMO | Admitting: Internal Medicine

## 2020-06-07 ENCOUNTER — Encounter: Payer: Self-pay | Admitting: Podiatry

## 2020-06-07 ENCOUNTER — Other Ambulatory Visit: Payer: Self-pay

## 2020-06-07 ENCOUNTER — Encounter: Payer: Self-pay | Admitting: Internal Medicine

## 2020-06-07 VITALS — BP 120/60 | HR 78 | Ht 70.0 in | Wt 141.0 lb

## 2020-06-07 DIAGNOSIS — Z79899 Other long term (current) drug therapy: Secondary | ICD-10-CM | POA: Diagnosis not present

## 2020-06-07 DIAGNOSIS — M79675 Pain in left toe(s): Secondary | ICD-10-CM

## 2020-06-07 DIAGNOSIS — B351 Tinea unguium: Secondary | ICD-10-CM | POA: Diagnosis not present

## 2020-06-07 DIAGNOSIS — N186 End stage renal disease: Secondary | ICD-10-CM

## 2020-06-07 DIAGNOSIS — D649 Anemia, unspecified: Secondary | ICD-10-CM | POA: Diagnosis not present

## 2020-06-07 DIAGNOSIS — E1121 Type 2 diabetes mellitus with diabetic nephropathy: Secondary | ICD-10-CM

## 2020-06-07 DIAGNOSIS — I739 Peripheral vascular disease, unspecified: Secondary | ICD-10-CM

## 2020-06-07 DIAGNOSIS — Z992 Dependence on renal dialysis: Secondary | ICD-10-CM

## 2020-06-07 DIAGNOSIS — I480 Paroxysmal atrial fibrillation: Secondary | ICD-10-CM | POA: Diagnosis not present

## 2020-06-07 DIAGNOSIS — M79674 Pain in right toe(s): Secondary | ICD-10-CM

## 2020-06-07 DIAGNOSIS — Z794 Long term (current) use of insulin: Secondary | ICD-10-CM

## 2020-06-07 LAB — CBC
Hematocrit: 30.9 % — ABNORMAL LOW (ref 37.5–51.0)
Hemoglobin: 9.8 g/dL — ABNORMAL LOW (ref 13.0–17.7)
MCH: 27.1 pg (ref 26.6–33.0)
MCHC: 31.7 g/dL (ref 31.5–35.7)
MCV: 85 fL (ref 79–97)
NRBC: 1 % — ABNORMAL HIGH (ref 0–0)
Platelets: 239 10*3/uL (ref 150–450)
RBC: 3.62 x10E6/uL — ABNORMAL LOW (ref 4.14–5.80)
RDW: 16.9 % — ABNORMAL HIGH (ref 11.6–15.4)
WBC: 7.7 10*3/uL (ref 3.4–10.8)

## 2020-06-07 LAB — HEPATIC FUNCTION PANEL
ALT: 11 IU/L (ref 0–44)
AST: 20 IU/L (ref 0–40)
Albumin: 3.5 g/dL — ABNORMAL LOW (ref 3.7–4.7)
Alkaline Phosphatase: 91 IU/L (ref 44–121)
Bilirubin Total: 0.2 mg/dL (ref 0.0–1.2)
Bilirubin, Direct: 0.12 mg/dL (ref 0.00–0.40)
Total Protein: 6.1 g/dL (ref 6.0–8.5)

## 2020-06-07 LAB — TSH: TSH: 3.58 u[IU]/mL (ref 0.450–4.500)

## 2020-06-07 MED ORDER — OXYCODONE-ACETAMINOPHEN 10-325 MG PO TABS: 1.0000 | ORAL_TABLET | ORAL | 0 refills | Status: DC | PRN

## 2020-06-07 NOTE — Progress Notes (Signed)
This patient returns to my office for at risk foot care.  This patient requires this care by a professional since this patient will be at risk due to having PVD,  PAD and previous  DVT.  Patient also has diabetes with kidney disease.    This patient is unable to cut nails himself since the patient cannot reach his nails.These nails are painful walking and wearing shoes.  This patient presents for at risk foot care today.  HOH.  General Appearance  Alert, conversant and in no acute stress.  Vascular  Dorsalis pedis and posterior tibial  pulses are not palpable  bilaterally.  Capillary return is within normal limits  bilaterally. Cold feet  bilaterally.  Neurologic  Senn-Weinstein monofilament wire test diminished   bilaterally. Muscle power within normal limits bilaterally.  Nails Thick disfigured discolored nails with subungual debris  from hallux to fifth toes bilaterally. No evidence of bacterial infection or drainage bilaterally.  Orthopedic  No limitations of motion  feet .  No crepitus or effusions noted.  No bony pathology or digital deformities noted.  Skin  normotropic skin with no porokeratosis noted bilaterally.  No signs of infections or ulcers noted.     Onychomycosis  Pain in right toes  Pain in left toes  Consent was obtained for treatment procedures.   Mechanical debridement of nails 1-5  bilaterally performed with a nail nipper.  Filed with dremel without incident. No infection or ulcer.     Return office visit   3 months                  Told patient to return for periodic foot care and evaluation due to potential at risk complications.   Gardiner Barefoot DPM

## 2020-06-07 NOTE — Patient Instructions (Signed)
Medication Instructions:  No changes today *If you need a refill on your cardiac medications before your next appointment, please call your pharmacy*   Lab Work: Today: liver function panel, tsh, cbc  If you have labs (blood work) drawn today and your tests are completely normal, you will receive your results only by: Marland Kitchen MyChart Message (if you have MyChart) OR . A paper copy in the mail If you have any lab test that is abnormal or we need to change your treatment, we will call you to review the results.   Testing/Procedures: none   Follow-Up: At [facility], you and your health needs are our priority.  As part of our continuing mission to provide you with exceptional heart care, we have created designated Provider Care Teams.  These Care Teams include your primary Cardiologist (physician) and Advanced Practice Providers (APPs -  Physician Assistants and Nurse Practitioners) who all work together to provide you with the care you need, when you need it.  Your next appointment:   3 month(s) around the end of March  The format for your next appointment:   In Person  Provider:   You may see Dorris Carnes, MD or one of the following Advanced Practice Providers on your designated Care Team:    Richardson Dopp, PA-C  Robbie Lis, [place]   Other Instructions

## 2020-06-08 DIAGNOSIS — J9621 Acute and chronic respiratory failure with hypoxia: Secondary | ICD-10-CM | POA: Diagnosis not present

## 2020-06-08 DIAGNOSIS — I2510 Atherosclerotic heart disease of native coronary artery without angina pectoris: Secondary | ICD-10-CM | POA: Diagnosis not present

## 2020-06-08 DIAGNOSIS — N2581 Secondary hyperparathyroidism of renal origin: Secondary | ICD-10-CM | POA: Diagnosis not present

## 2020-06-08 DIAGNOSIS — N186 End stage renal disease: Secondary | ICD-10-CM | POA: Diagnosis not present

## 2020-06-08 DIAGNOSIS — Z992 Dependence on renal dialysis: Secondary | ICD-10-CM | POA: Diagnosis not present

## 2020-06-08 DIAGNOSIS — I509 Heart failure, unspecified: Secondary | ICD-10-CM | POA: Diagnosis not present

## 2020-06-08 DIAGNOSIS — J849 Interstitial pulmonary disease, unspecified: Secondary | ICD-10-CM | POA: Diagnosis not present

## 2020-06-08 DIAGNOSIS — J449 Chronic obstructive pulmonary disease, unspecified: Secondary | ICD-10-CM | POA: Diagnosis not present

## 2020-06-09 ENCOUNTER — Other Ambulatory Visit: Payer: Self-pay | Admitting: Internal Medicine

## 2020-06-09 MED ORDER — ACCU-CHEK AVIVA PLUS VI STRP
ORAL_STRIP | 5 refills | Status: DC
Start: 2020-06-09 — End: 2020-06-10

## 2020-06-10 ENCOUNTER — Other Ambulatory Visit: Payer: Self-pay | Admitting: *Deleted

## 2020-06-10 MED ORDER — ACCU-CHEK AVIVA PLUS VI STRP: ORAL_STRIP | 5 refills | Status: AC

## 2020-06-10 NOTE — Progress Notes (Signed)
Per request from [facility], edited rx for glucose test strips, which were sent in 06/09/20 by Dr. Harrington Challenger. Edited to include instructions for testing.

## 2020-06-11 DIAGNOSIS — Z992 Dependence on renal dialysis: Secondary | ICD-10-CM | POA: Diagnosis not present

## 2020-06-11 DIAGNOSIS — N2581 Secondary hyperparathyroidism of renal origin: Secondary | ICD-10-CM | POA: Diagnosis not present

## 2020-06-11 DIAGNOSIS — N186 End stage renal disease: Secondary | ICD-10-CM | POA: Diagnosis not present

## 2020-06-12 DIAGNOSIS — N186 End stage renal disease: Secondary | ICD-10-CM | POA: Diagnosis not present

## 2020-06-12 DIAGNOSIS — Z515 Encounter for palliative care: Secondary | ICD-10-CM | POA: Diagnosis not present

## 2020-06-12 DIAGNOSIS — F329 Major depressive disorder, single episode, unspecified: Secondary | ICD-10-CM | POA: Diagnosis not present

## 2020-06-12 DIAGNOSIS — K4091 Unilateral inguinal hernia, without obstruction or gangrene, recurrent: Secondary | ICD-10-CM | POA: Diagnosis not present

## 2020-06-12 DIAGNOSIS — Z992 Dependence on renal dialysis: Secondary | ICD-10-CM | POA: Diagnosis not present

## 2020-06-13 ENCOUNTER — Telehealth: Payer: Self-pay | Admitting: *Deleted

## 2020-06-13 ENCOUNTER — Other Ambulatory Visit: Payer: Self-pay | Admitting: *Deleted

## 2020-06-13 DIAGNOSIS — Z992 Dependence on renal dialysis: Secondary | ICD-10-CM | POA: Diagnosis not present

## 2020-06-13 DIAGNOSIS — N186 End stage renal disease: Secondary | ICD-10-CM | POA: Diagnosis not present

## 2020-06-13 DIAGNOSIS — D649 Anemia, unspecified: Secondary | ICD-10-CM

## 2020-06-13 DIAGNOSIS — N2581 Secondary hyperparathyroidism of renal origin: Secondary | ICD-10-CM | POA: Diagnosis not present

## 2020-06-13 NOTE — Telephone Encounter (Signed)
-----   Message from Dorris Carnes V, MD sent at 06/09/2020 10:00 PM EST ----- Thyroid function is normal  Liver function is OK Hgb is 9.8  Down from 11.5  Increase iron to 2x per day   Follow up CBC in 1 month

## 2020-06-14 DIAGNOSIS — I2510 Atherosclerotic heart disease of native coronary artery without angina pectoris: Secondary | ICD-10-CM | POA: Diagnosis not present

## 2020-06-14 DIAGNOSIS — J9621 Acute and chronic respiratory failure with hypoxia: Secondary | ICD-10-CM | POA: Diagnosis not present

## 2020-06-14 DIAGNOSIS — J449 Chronic obstructive pulmonary disease, unspecified: Secondary | ICD-10-CM | POA: Diagnosis not present

## 2020-06-14 DIAGNOSIS — J849 Interstitial pulmonary disease, unspecified: Secondary | ICD-10-CM | POA: Diagnosis not present

## 2020-06-14 DIAGNOSIS — I509 Heart failure, unspecified: Secondary | ICD-10-CM | POA: Diagnosis not present

## 2020-06-15 DIAGNOSIS — N2581 Secondary hyperparathyroidism of renal origin: Secondary | ICD-10-CM | POA: Diagnosis not present

## 2020-06-15 DIAGNOSIS — Z992 Dependence on renal dialysis: Secondary | ICD-10-CM | POA: Diagnosis not present

## 2020-06-15 DIAGNOSIS — N186 End stage renal disease: Secondary | ICD-10-CM | POA: Diagnosis not present

## 2020-06-18 DIAGNOSIS — N186 End stage renal disease: Secondary | ICD-10-CM | POA: Diagnosis not present

## 2020-06-18 DIAGNOSIS — Z992 Dependence on renal dialysis: Secondary | ICD-10-CM | POA: Diagnosis not present

## 2020-06-18 DIAGNOSIS — N2581 Secondary hyperparathyroidism of renal origin: Secondary | ICD-10-CM | POA: Diagnosis not present

## 2020-06-20 DIAGNOSIS — N186 End stage renal disease: Secondary | ICD-10-CM | POA: Diagnosis not present

## 2020-06-20 DIAGNOSIS — Z992 Dependence on renal dialysis: Secondary | ICD-10-CM | POA: Diagnosis not present

## 2020-06-20 DIAGNOSIS — N2581 Secondary hyperparathyroidism of renal origin: Secondary | ICD-10-CM | POA: Diagnosis not present

## 2020-06-23 DIAGNOSIS — N186 End stage renal disease: Secondary | ICD-10-CM | POA: Diagnosis not present

## 2020-06-23 DIAGNOSIS — Z992 Dependence on renal dialysis: Secondary | ICD-10-CM | POA: Diagnosis not present

## 2020-06-23 DIAGNOSIS — N2581 Secondary hyperparathyroidism of renal origin: Secondary | ICD-10-CM | POA: Diagnosis not present

## 2020-06-25 ENCOUNTER — Other Ambulatory Visit: Payer: Self-pay

## 2020-06-25 DIAGNOSIS — N2581 Secondary hyperparathyroidism of renal origin: Secondary | ICD-10-CM | POA: Diagnosis not present

## 2020-06-25 DIAGNOSIS — Z992 Dependence on renal dialysis: Secondary | ICD-10-CM | POA: Diagnosis not present

## 2020-06-25 DIAGNOSIS — N186 End stage renal disease: Secondary | ICD-10-CM

## 2020-06-27 DIAGNOSIS — N2581 Secondary hyperparathyroidism of renal origin: Secondary | ICD-10-CM | POA: Diagnosis not present

## 2020-06-27 DIAGNOSIS — Z992 Dependence on renal dialysis: Secondary | ICD-10-CM | POA: Diagnosis not present

## 2020-06-27 DIAGNOSIS — N186 End stage renal disease: Secondary | ICD-10-CM | POA: Diagnosis not present

## 2020-06-28 DIAGNOSIS — Z992 Dependence on renal dialysis: Secondary | ICD-10-CM | POA: Diagnosis not present

## 2020-06-28 DIAGNOSIS — N186 End stage renal disease: Secondary | ICD-10-CM | POA: Diagnosis not present

## 2020-06-28 DIAGNOSIS — E1122 Type 2 diabetes mellitus with diabetic chronic kidney disease: Secondary | ICD-10-CM | POA: Diagnosis not present

## 2020-06-29 DIAGNOSIS — Z992 Dependence on renal dialysis: Secondary | ICD-10-CM | POA: Diagnosis not present

## 2020-06-29 DIAGNOSIS — E1122 Type 2 diabetes mellitus with diabetic chronic kidney disease: Secondary | ICD-10-CM | POA: Diagnosis not present

## 2020-06-29 DIAGNOSIS — N186 End stage renal disease: Secondary | ICD-10-CM | POA: Diagnosis not present

## 2020-06-30 DIAGNOSIS — N2581 Secondary hyperparathyroidism of renal origin: Secondary | ICD-10-CM | POA: Diagnosis not present

## 2020-06-30 DIAGNOSIS — Z992 Dependence on renal dialysis: Secondary | ICD-10-CM | POA: Diagnosis not present

## 2020-06-30 DIAGNOSIS — N186 End stage renal disease: Secondary | ICD-10-CM | POA: Diagnosis not present

## 2020-07-01 ENCOUNTER — Encounter: Payer: Self-pay | Admitting: Orthopedic Surgery

## 2020-07-01 ENCOUNTER — Ambulatory Visit (INDEPENDENT_AMBULATORY_CARE_PROVIDER_SITE_OTHER): Payer: Medicare HMO

## 2020-07-01 ENCOUNTER — Ambulatory Visit (INDEPENDENT_AMBULATORY_CARE_PROVIDER_SITE_OTHER): Payer: Medicare HMO | Admitting: Orthopedic Surgery

## 2020-07-01 VITALS — Ht 70.0 in | Wt 141.0 lb

## 2020-07-01 DIAGNOSIS — M75112 Incomplete rotator cuff tear or rupture of left shoulder, not specified as traumatic: Secondary | ICD-10-CM

## 2020-07-01 DIAGNOSIS — M542 Cervicalgia: Secondary | ICD-10-CM

## 2020-07-01 DIAGNOSIS — M75121 Complete rotator cuff tear or rupture of right shoulder, not specified as traumatic: Secondary | ICD-10-CM

## 2020-07-01 DIAGNOSIS — N186 End stage renal disease: Secondary | ICD-10-CM | POA: Diagnosis not present
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
## 2020-07-02 DIAGNOSIS — N2581 Secondary hyperparathyroidism of renal origin: Secondary | ICD-10-CM | POA: Diagnosis not present

## 2020-07-02 DIAGNOSIS — Z992 Dependence on renal dialysis: Secondary | ICD-10-CM | POA: Diagnosis not present

## 2020-07-02 DIAGNOSIS — N186 End stage renal disease: Secondary | ICD-10-CM | POA: Diagnosis not present

## 2020-07-04 ENCOUNTER — Encounter: Payer: Self-pay | Admitting: Family Medicine

## 2020-07-04 ENCOUNTER — Ambulatory Visit (INDEPENDENT_AMBULATORY_CARE_PROVIDER_SITE_OTHER): Payer: Medicare HMO | Admitting: Family Medicine

## 2020-07-04 ENCOUNTER — Other Ambulatory Visit: Payer: Self-pay

## 2020-07-04 VITALS — BP 118/60 | HR 44 | Temp 98.0°F | Resp 14 | Ht 70.0 in | Wt 145.0 lb

## 2020-07-04 DIAGNOSIS — L298 Other pruritus: Secondary | ICD-10-CM | POA: Diagnosis not present

## 2020-07-04 DIAGNOSIS — N186 End stage renal disease: Secondary | ICD-10-CM | POA: Diagnosis not present

## 2020-07-04 DIAGNOSIS — Z992 Dependence on renal dialysis: Secondary | ICD-10-CM | POA: Diagnosis not present

## 2020-07-04 DIAGNOSIS — N2581 Secondary hyperparathyroidism of renal origin: Secondary | ICD-10-CM | POA: Diagnosis not present

## 2020-07-04 MED ORDER — METHYLPREDNISOLONE ACETATE 40 MG/ML IJ SUSP
60.0000 mg | Freq: Once | INTRAMUSCULAR | Status: AC
  Administered 2020-07-04: 60 mg via INTRAMUSCULAR
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
## 2020-07-05 ENCOUNTER — Other Ambulatory Visit: Payer: Self-pay | Admitting: Family Medicine

## 2020-07-05 ENCOUNTER — Telehealth: Payer: Self-pay

## 2020-07-05 DIAGNOSIS — J449 Chronic obstructive pulmonary disease, unspecified: Secondary | ICD-10-CM | POA: Diagnosis not present

## 2020-07-05 DIAGNOSIS — N186 End stage renal disease: Secondary | ICD-10-CM | POA: Diagnosis not present

## 2020-07-05 DIAGNOSIS — Z992 Dependence on renal dialysis: Secondary | ICD-10-CM | POA: Diagnosis not present

## 2020-07-05 DIAGNOSIS — K4030 Unilateral inguinal hernia, with obstruction, without gangrene, not specified as recurrent: Secondary | ICD-10-CM | POA: Diagnosis not present

## 2020-07-05 MED ORDER — OXYCODONE-ACETAMINOPHEN 10-325 MG PO TABS: 1.0000 | ORAL_TABLET | ORAL | 0 refills | Status: AC | PRN

## 2020-07-05 MED ORDER — GABAPENTIN 100 MG PO CAPS: 100.0000 mg | ORAL_CAPSULE | Freq: Every day | ORAL | 3 refills | Status: AC

## 2020-07-05 NOTE — Telephone Encounter (Signed)
Sent gabapentin 100 mg poqd for itching and refilled oxycodone.

## 2020-07-06 DIAGNOSIS — N186 End stage renal disease: Secondary | ICD-10-CM | POA: Diagnosis not present

## 2020-07-06 DIAGNOSIS — N2581 Secondary hyperparathyroidism of renal origin: Secondary | ICD-10-CM | POA: Diagnosis not present

## 2020-07-06 DIAGNOSIS — Z992 Dependence on renal dialysis: Secondary | ICD-10-CM | POA: Diagnosis not present

## 2020-07-09 DIAGNOSIS — J9621 Acute and chronic respiratory failure with hypoxia: Secondary | ICD-10-CM | POA: Diagnosis not present

## 2020-07-09 DIAGNOSIS — N2581 Secondary hyperparathyroidism of renal origin: Secondary | ICD-10-CM | POA: Diagnosis not present

## 2020-07-09 DIAGNOSIS — J849 Interstitial pulmonary disease, unspecified: Secondary | ICD-10-CM | POA: Diagnosis not present

## 2020-07-09 DIAGNOSIS — J449 Chronic obstructive pulmonary disease, unspecified: Secondary | ICD-10-CM | POA: Diagnosis not present

## 2020-07-09 DIAGNOSIS — Z992 Dependence on renal dialysis: Secondary | ICD-10-CM | POA: Diagnosis not present

## 2020-07-09 DIAGNOSIS — I2510 Atherosclerotic heart disease of native coronary artery without angina pectoris: Secondary | ICD-10-CM | POA: Diagnosis not present

## 2020-07-09 DIAGNOSIS — I509 Heart failure, unspecified: Secondary | ICD-10-CM | POA: Diagnosis not present

## 2020-07-09 DIAGNOSIS — N186 End stage renal disease: Secondary | ICD-10-CM | POA: Diagnosis not present

## 2020-07-10 ENCOUNTER — Other Ambulatory Visit (HOSPITAL_COMMUNITY)
Admission: RE | Admit: 2020-07-10 | Discharge: 2020-07-10 | Disposition: A | Discharge status: Home / Self Care | Payer: Medicare HMO | Source: Ambulatory Visit | Attending: Vascular Surgery | Admitting: Vascular Surgery

## 2020-07-10 ENCOUNTER — Other Ambulatory Visit: Payer: Self-pay

## 2020-07-10 ENCOUNTER — Ambulatory Visit (INDEPENDENT_AMBULATORY_CARE_PROVIDER_SITE_OTHER)
Admission: RE | Admit: 2020-07-10 | Discharge: 2020-07-10 | Disposition: A | Discharge status: Home / Self Care | Payer: Medicare HMO | Source: Ambulatory Visit | Attending: Vascular Surgery | Admitting: Vascular Surgery

## 2020-07-10 ENCOUNTER — Ambulatory Visit (INDEPENDENT_AMBULATORY_CARE_PROVIDER_SITE_OTHER): Payer: Medicare HMO | Admitting: Vascular Surgery

## 2020-07-10 ENCOUNTER — Ambulatory Visit (HOSPITAL_COMMUNITY)
Admission: RE | Admit: 2020-07-10 | Discharge: 2020-07-10 | Disposition: A | Discharge status: Home / Self Care | Payer: Medicare HMO | Source: Ambulatory Visit | Attending: Vascular Surgery | Admitting: Vascular Surgery

## 2020-07-10 ENCOUNTER — Encounter: Payer: Self-pay | Admitting: Vascular Surgery

## 2020-07-10 VITALS — BP 154/69 | HR 78 | Temp 97.6°F | Resp 20 | Ht 70.0 in | Wt 145.0 lb

## 2020-07-10 DIAGNOSIS — Z992 Dependence on renal dialysis: Secondary | ICD-10-CM

## 2020-07-10 DIAGNOSIS — N186 End stage renal disease: Secondary | ICD-10-CM | POA: Diagnosis not present

## 2020-07-10 DIAGNOSIS — Z01812 Encounter for preprocedural laboratory examination: Secondary | ICD-10-CM | POA: Insufficient documentation

## 2020-07-10 DIAGNOSIS — Z20822 Contact with and (suspected) exposure to covid-19: Secondary | ICD-10-CM | POA: Insufficient documentation

## 2020-07-10 LAB — SARS CORONAVIRUS 2 (TAT 6-24 HRS): SARS Coronavirus 2: NEGATIVE
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
## 2020-07-11 ENCOUNTER — Encounter (HOSPITAL_COMMUNITY): Payer: Self-pay | Admitting: Vascular Surgery

## 2020-07-11 DIAGNOSIS — N2581 Secondary hyperparathyroidism of renal origin: Secondary | ICD-10-CM | POA: Diagnosis not present

## 2020-07-11 DIAGNOSIS — Z992 Dependence on renal dialysis: Secondary | ICD-10-CM | POA: Diagnosis not present

## 2020-07-11 DIAGNOSIS — N186 End stage renal disease: Secondary | ICD-10-CM | POA: Diagnosis not present

## 2020-07-11 NOTE — Progress Notes (Addendum)
I left a message on Mr. Creighton cell phone and asked him to return my call. I received a return call from his daughter-in-law, Jobe Marker, who we have clearance to speak with.  Mr. Dirr has not had any chest pain, patient has shortness of breath due to COPD, ILD and smoking 2 packs of cigarettes a day.  Patient uses the wheelchair most of the time.  Mr. WIlls is not taking ASA at this time, patient has Purpura, and was seen by PCP, Dr. [name] on 07/04/20.  PCP thinks that "ASA alllong with incomplete dialysis treatments is causing difffuse itching." Dr. Dennard Schaumann had patient stop ASA and ordered Gabapentin.  Mr. Albor has type II diabetes, patient does not take any medications for it now, he lost a lot of weight and he has not needed any.  Per Ms Fabio Neighbors,  Mr Fiorito checks CBG ocassion.  I gave instructions to have Mr.Petrow check CBG after awaking and every 2 hours until arrival  to the hospital. If CBG is less than 70 to drink 1/2 cup of a clear juice. Recheck CBG in 15 minutes then call pre- op desk at [phone] for further instructions.

## 2020-07-12 ENCOUNTER — Ambulatory Visit (HOSPITAL_COMMUNITY): Payer: Medicare HMO

## 2020-07-12 ENCOUNTER — Ambulatory Visit (HOSPITAL_COMMUNITY): Payer: Medicare HMO | Admitting: Certified Registered Nurse Anesthetist

## 2020-07-12 ENCOUNTER — Ambulatory Visit (HOSPITAL_COMMUNITY)
Admission: RE | Admit: 2020-07-12 | Discharge: 2020-07-12 | Disposition: A | Discharge status: Home / Self Care | Payer: Medicare HMO | Attending: Vascular Surgery | Admitting: Vascular Surgery

## 2020-07-12 ENCOUNTER — Other Ambulatory Visit: Payer: Self-pay

## 2020-07-12 ENCOUNTER — Encounter (HOSPITAL_COMMUNITY)
Admission: RE | Disposition: A | Discharge status: Home / Self Care | Payer: Self-pay | Source: Home / Self Care | Attending: Vascular Surgery

## 2020-07-12 ENCOUNTER — Encounter (HOSPITAL_COMMUNITY): Payer: Self-pay | Admitting: Vascular Surgery

## 2020-07-12 DIAGNOSIS — Z043 Encounter for examination and observation following other accident: Secondary | ICD-10-CM | POA: Diagnosis not present

## 2020-07-12 DIAGNOSIS — I739 Peripheral vascular disease, unspecified: Secondary | ICD-10-CM | POA: Insufficient documentation

## 2020-07-12 DIAGNOSIS — T82898A Other specified complication of vascular prosthetic devices, implants and grafts, initial encounter: Secondary | ICD-10-CM | POA: Diagnosis not present

## 2020-07-12 DIAGNOSIS — Z91013 Allergy to seafood: Secondary | ICD-10-CM | POA: Diagnosis not present

## 2020-07-12 DIAGNOSIS — Z992 Dependence on renal dialysis: Secondary | ICD-10-CM | POA: Insufficient documentation

## 2020-07-12 DIAGNOSIS — J849 Interstitial pulmonary disease, unspecified: Secondary | ICD-10-CM | POA: Diagnosis not present

## 2020-07-12 DIAGNOSIS — X58XXXA Exposure to other specified factors, initial encounter: Secondary | ICD-10-CM | POA: Diagnosis not present

## 2020-07-12 DIAGNOSIS — Z8249 Family history of ischemic heart disease and other diseases of the circulatory system: Secondary | ICD-10-CM | POA: Insufficient documentation

## 2020-07-12 DIAGNOSIS — Z888 Allergy status to other drugs, medicaments and biological substances status: Secondary | ICD-10-CM | POA: Diagnosis not present

## 2020-07-12 DIAGNOSIS — I2510 Atherosclerotic heart disease of native coronary artery without angina pectoris: Secondary | ICD-10-CM | POA: Diagnosis not present

## 2020-07-12 DIAGNOSIS — R918 Other nonspecific abnormal finding of lung field: Secondary | ICD-10-CM | POA: Diagnosis not present

## 2020-07-12 DIAGNOSIS — Z800 Family history of malignant neoplasm of digestive organs: Secondary | ICD-10-CM | POA: Diagnosis not present

## 2020-07-12 DIAGNOSIS — Z833 Family history of diabetes mellitus: Secondary | ICD-10-CM | POA: Insufficient documentation

## 2020-07-12 DIAGNOSIS — Z7982 Long term (current) use of aspirin: Secondary | ICD-10-CM | POA: Insufficient documentation

## 2020-07-12 DIAGNOSIS — Z91018 Allergy to other foods: Secondary | ICD-10-CM | POA: Diagnosis not present

## 2020-07-12 DIAGNOSIS — Z79899 Other long term (current) drug therapy: Secondary | ICD-10-CM | POA: Diagnosis not present

## 2020-07-12 DIAGNOSIS — E1122 Type 2 diabetes mellitus with diabetic chronic kidney disease: Secondary | ICD-10-CM | POA: Insufficient documentation

## 2020-07-12 DIAGNOSIS — Z419 Encounter for procedure for purposes other than remedying health state, unspecified: Secondary | ICD-10-CM

## 2020-07-12 DIAGNOSIS — I132 Hypertensive heart and chronic kidney disease with heart failure and with stage 5 chronic kidney disease, or end stage renal disease: Secondary | ICD-10-CM | POA: Insufficient documentation

## 2020-07-12 DIAGNOSIS — Z885 Allergy status to narcotic agent status: Secondary | ICD-10-CM | POA: Diagnosis not present

## 2020-07-12 DIAGNOSIS — I4819 Other persistent atrial fibrillation: Secondary | ICD-10-CM | POA: Insufficient documentation

## 2020-07-12 DIAGNOSIS — I5032 Chronic diastolic (congestive) heart failure: Secondary | ICD-10-CM | POA: Diagnosis not present

## 2020-07-12 DIAGNOSIS — Z8551 Personal history of malignant neoplasm of bladder: Secondary | ICD-10-CM | POA: Diagnosis not present

## 2020-07-12 DIAGNOSIS — Z86718 Personal history of other venous thrombosis and embolism: Secondary | ICD-10-CM | POA: Insufficient documentation

## 2020-07-12 DIAGNOSIS — N186 End stage renal disease: Secondary | ICD-10-CM | POA: Diagnosis not present

## 2020-07-12 DIAGNOSIS — N185 Chronic kidney disease, stage 5: Secondary | ICD-10-CM | POA: Diagnosis not present

## 2020-07-12 DIAGNOSIS — W19XXXA Unspecified fall, initial encounter: Secondary | ICD-10-CM

## 2020-07-12 DIAGNOSIS — Z95828 Presence of other vascular implants and grafts: Secondary | ICD-10-CM

## 2020-07-12 DIAGNOSIS — Z452 Encounter for adjustment and management of vascular access device: Secondary | ICD-10-CM | POA: Diagnosis not present

## 2020-07-12 DIAGNOSIS — J449 Chronic obstructive pulmonary disease, unspecified: Secondary | ICD-10-CM | POA: Diagnosis not present

## 2020-07-12 HISTORY — DX: Acute myocardial infarction, unspecified: I21.9

## 2020-07-12 HISTORY — PX: INSERTION OF DIALYSIS CATHETER: SHX1324

## 2020-07-12 HISTORY — PX: REMOVAL OF A DIALYSIS CATHETER: SHX6053

## 2020-07-12 HISTORY — DX: Depression, unspecified: F32.A

## 2020-07-12 HISTORY — PX: AV FISTULA PLACEMENT: SHX1204

## 2020-07-12 LAB — GLUCOSE, CAPILLARY
Glucose-Capillary: 77 mg/dL (ref 70–99)
Glucose-Capillary: 39 mg/dL — CL (ref 70–99)
Glucose-Capillary: 174 mg/dL — ABNORMAL HIGH (ref 70–99)
Glucose-Capillary: 100 mg/dL — ABNORMAL HIGH (ref 70–99)

## 2020-07-12 LAB — POCT I-STAT, CHEM 8
BUN: 36 mg/dL — ABNORMAL HIGH (ref 8–23)
Calcium, Ion: 1.1 mmol/L — ABNORMAL LOW (ref 1.15–1.40)
Chloride: 103 mmol/L (ref 98–111)
Creatinine, Ser: 5.8 mg/dL — ABNORMAL HIGH (ref 0.61–1.24)
Glucose, Bld: 76 mg/dL (ref 70–99)
HCT: 38 % — ABNORMAL LOW (ref 39.0–52.0)
Hemoglobin: 12.9 g/dL — ABNORMAL LOW (ref 13.0–17.0)
Potassium: 4.9 mmol/L (ref 3.5–5.1)
Sodium: 138 mmol/L (ref 135–145)
TCO2: 24 mmol/L (ref 22–32)

## 2020-07-12 SURGERY — INSERTION OF ARTERIOVENOUS (AV) GORE-TEX GRAFT ARM
Anesthesia: Monitor Anesthesia Care | Site: Neck | Laterality: Right

## 2020-07-12 MED ORDER — MEPERIDINE HCL 25 MG/ML IJ SOLN: 6.2500 mg | INTRAMUSCULAR | Status: DC | PRN

## 2020-07-12 MED ORDER — ACETAMINOPHEN 325 MG PO TABS: 325.0000 mg | ORAL_TABLET | Freq: Once | ORAL | Status: DC | PRN

## 2020-07-12 MED ORDER — LACTATED RINGERS IV SOLN: INTRAVENOUS | Status: DC

## 2020-07-12 MED ORDER — CHLORHEXIDINE GLUCONATE 0.12 % MT SOLN
15.0000 mL | Freq: Once | OROMUCOSAL | Status: AC
  Administered 2020-07-12: 15 mL via OROMUCOSAL
  Filled 2020-07-12: qty 15

## 2020-07-12 MED ORDER — PROPOFOL 500 MG/50ML IV EMUL
INTRAVENOUS | Status: DC | PRN
  Administered 2020-07-12: 100 ug/kg/min via INTRAVENOUS

## 2020-07-12 MED ORDER — SODIUM CHLORIDE 0.9 % IV SOLN
INTRAVENOUS | Status: AC
  Filled 2020-07-12: qty 1.2

## 2020-07-12 MED ORDER — MIDAZOLAM HCL 2 MG/2ML IJ SOLN: 1.0000 mg | Freq: Once | INTRAMUSCULAR | Status: AC

## 2020-07-12 MED ORDER — CHLORHEXIDINE GLUCONATE 4 % EX LIQD: 60.0000 mL | Freq: Once | CUTANEOUS | Status: DC

## 2020-07-12 MED ORDER — 0.9 % SODIUM CHLORIDE (POUR BTL) OPTIME
TOPICAL | Status: DC | PRN
  Administered 2020-07-12: 1000 mL

## 2020-07-12 MED ORDER — PAPAVERINE HCL 30 MG/ML IJ SOLN
INTRAMUSCULAR | Status: AC
  Filled 2020-07-12: qty 2

## 2020-07-12 MED ORDER — ORAL CARE MOUTH RINSE: 15.0000 mL | Freq: Once | OROMUCOSAL | Status: AC

## 2020-07-12 MED ORDER — MIDAZOLAM HCL 2 MG/2ML IJ SOLN
INTRAMUSCULAR | Status: AC
  Administered 2020-07-12: 1 mg via INTRAVENOUS
  Filled 2020-07-12: qty 2

## 2020-07-12 MED ORDER — PHENYLEPHRINE HCL-NACL 10-0.9 MG/250ML-% IV SOLN
INTRAVENOUS | Status: DC | PRN
  Administered 2020-07-12: 25 ug/min via INTRAVENOUS

## 2020-07-12 MED ORDER — VASOPRESSIN 20 UNIT/ML IV SOLN
INTRAVENOUS | Status: AC
  Filled 2020-07-12: qty 1

## 2020-07-12 MED ORDER — LIDOCAINE-EPINEPHRINE (PF) 1 %-1:200000 IJ SOLN
INTRAMUSCULAR | Status: AC
  Filled 2020-07-12: qty 30

## 2020-07-12 MED ORDER — VASOPRESSIN 20 UNIT/ML IV SOLN
INTRAVENOUS | Status: DC | PRN
  Administered 2020-07-12 (×6): 2 [IU] via INTRAVENOUS

## 2020-07-12 MED ORDER — SODIUM CHLORIDE 0.9 % IV SOLN
INTRAVENOUS | Status: DC | PRN
  Administered 2020-07-12: 500 mL

## 2020-07-12 MED ORDER — LIDOCAINE-EPINEPHRINE (PF) 1 %-1:200000 IJ SOLN
INTRAMUSCULAR | Status: DC | PRN
  Administered 2020-07-12: 30 mL

## 2020-07-12 MED ORDER — ACETAMINOPHEN 10 MG/ML IV SOLN: 1000.0000 mg | Freq: Once | INTRAVENOUS | Status: DC | PRN

## 2020-07-12 MED ORDER — HEPARIN SODIUM (PORCINE) 1000 UNIT/ML IJ SOLN
INTRAMUSCULAR | Status: AC
  Filled 2020-07-12: qty 1

## 2020-07-12 MED ORDER — FENTANYL CITRATE (PF) 100 MCG/2ML IJ SOLN
INTRAMUSCULAR | Status: AC
  Filled 2020-07-12: qty 2

## 2020-07-12 MED ORDER — FENTANYL CITRATE (PF) 100 MCG/2ML IJ SOLN: 25.0000 ug | Freq: Once | INTRAMUSCULAR | Status: AC

## 2020-07-12 MED ORDER — ACETAMINOPHEN 160 MG/5ML PO SOLN: 325.0000 mg | Freq: Once | ORAL | Status: DC | PRN

## 2020-07-12 MED ORDER — PHENYLEPHRINE 40 MCG/ML (10ML) SYRINGE FOR IV PUSH (FOR BLOOD PRESSURE SUPPORT)
PREFILLED_SYRINGE | INTRAVENOUS | Status: DC | PRN
  Administered 2020-07-12 (×3): 80 ug via INTRAVENOUS

## 2020-07-12 MED ORDER — DEXTROSE 50 % IV SOLN
INTRAVENOUS | Status: AC
  Administered 2020-07-12: 50 mL via INTRAVENOUS
  Filled 2020-07-12: qty 50

## 2020-07-12 MED ORDER — HEPARIN SODIUM (PORCINE) 1000 UNIT/ML IJ SOLN
INTRAMUSCULAR | Status: DC | PRN
  Administered 2020-07-12: 6000 [IU] via INTRAVENOUS

## 2020-07-12 MED ORDER — FENTANYL CITRATE (PF) 100 MCG/2ML IJ SOLN
25.0000 ug | INTRAMUSCULAR | Status: DC | PRN
  Administered 2020-07-12: 25 ug via INTRAVENOUS

## 2020-07-12 MED ORDER — LIDOCAINE HCL (PF) 1 % IJ SOLN
INTRAMUSCULAR | Status: DC | PRN
  Administered 2020-07-12: 9 mL

## 2020-07-12 MED ORDER — LIDOCAINE HCL (PF) 1 % IJ SOLN
INTRAMUSCULAR | Status: AC
  Filled 2020-07-12: qty 30

## 2020-07-12 MED ORDER — PROTAMINE SULFATE 10 MG/ML IV SOLN
INTRAVENOUS | Status: AC
  Filled 2020-07-12: qty 5

## 2020-07-12 MED ORDER — HEPARIN SODIUM (PORCINE) 1000 UNIT/ML IJ SOLN
INTRAMUSCULAR | Status: DC | PRN
  Administered 2020-07-12: 4200 [IU]

## 2020-07-12 MED ORDER — ROPIVACAINE HCL 5 MG/ML IJ SOLN
INTRAMUSCULAR | Status: DC | PRN
  Administered 2020-07-12: 15 mL via PERINEURAL

## 2020-07-12 MED ORDER — CEFAZOLIN SODIUM-DEXTROSE 2-4 GM/100ML-% IV SOLN
2.0000 g | INTRAVENOUS | Status: AC
  Administered 2020-07-12: 2 g via INTRAVENOUS
  Filled 2020-07-12: qty 100

## 2020-07-12 MED ORDER — FENTANYL CITRATE (PF) 100 MCG/2ML IJ SOLN
INTRAMUSCULAR | Status: AC
  Administered 2020-07-12: 25 ug via INTRAVENOUS
  Filled 2020-07-12: qty 2

## 2020-07-12 MED ORDER — KETAMINE HCL 50 MG/5ML IJ SOSY
PREFILLED_SYRINGE | INTRAMUSCULAR | Status: AC
  Filled 2020-07-12: qty 5

## 2020-07-12 MED ORDER — PROTAMINE SULFATE 10 MG/ML IV SOLN
INTRAVENOUS | Status: DC | PRN
  Administered 2020-07-12: 40 mg via INTRAVENOUS

## 2020-07-12 MED ORDER — SODIUM CHLORIDE 0.9 % IV SOLN: INTRAVENOUS | Status: DC

## 2020-07-12 MED ORDER — DEXTROSE 50 % IV SOLN
1.0000 | Freq: Once | INTRAVENOUS | Status: AC
  Filled 2020-07-12: qty 50

## 2020-07-12 MED ORDER — LIDOCAINE 2% (20 MG/ML) 5 ML SYRINGE
INTRAMUSCULAR | Status: DC | PRN
  Administered 2020-07-12: 40 mg via INTRAVENOUS

## 2020-07-12 MED ORDER — ALBUMIN HUMAN 5 % IV SOLN: INTRAVENOUS | Status: DC | PRN

## 2020-07-12 MED ORDER — DEXAMETHASONE SODIUM PHOSPHATE 10 MG/ML IJ SOLN
INTRAMUSCULAR | Status: DC | PRN
  Administered 2020-07-12: 5 mg via INTRAVENOUS

## 2020-07-12 SURGICAL SUPPLY — 64 items
ADH SKN CLS APL DERMABOND .7 (GAUZE/BANDAGES/DRESSINGS) ×9
APL PRP STRL LF DISP 70% ISPRP (MISCELLANEOUS) ×3
ARMBAND PINK RESTRICT EXTREMIT (MISCELLANEOUS) ×8 IMPLANT
BAG DECANTER FOR FLEXI CONT (MISCELLANEOUS) ×4 IMPLANT
BIOPATCH RED 1 DISK 7.0 (GAUZE/BANDAGES/DRESSINGS) ×4 IMPLANT
CANISTER SUCT 3000ML PPV (MISCELLANEOUS) ×4 IMPLANT
CANNULA VESSEL 3MM 2 BLNT TIP (CANNULA) ×4 IMPLANT
CATH PALINDROME-P 19CM W/VT (CATHETERS) IMPLANT
CATH PALINDROME-P 23CM W/VT (CATHETERS) IMPLANT
CATH PALINDROME-P 28CM W/VT (CATHETERS) ×4 IMPLANT
CHLORAPREP W/TINT 26 (MISCELLANEOUS) ×4 IMPLANT
CLIP VESOCCLUDE MED 6/CT (CLIP) ×4 IMPLANT
CLIP VESOCCLUDE SM WIDE 6/CT (CLIP) ×4 IMPLANT
COVER PROBE W GEL 5X96 (DRAPES) IMPLANT
COVER SURGICAL LIGHT HANDLE (MISCELLANEOUS) ×4 IMPLANT
DECANTER SPIKE VIAL GLASS SM (MISCELLANEOUS) ×8 IMPLANT
DERMABOND ADVANCED (GAUZE/BANDAGES/DRESSINGS) ×3
DERMABOND ADVANCED .7 DNX12 (GAUZE/BANDAGES/DRESSINGS) ×9 IMPLANT
DRAPE C-ARM 42X72 X-RAY (DRAPES) ×4 IMPLANT
DRAPE CHEST BREAST 15X10 FENES (DRAPES) ×4 IMPLANT
DRAPE ORTHO SPLIT 77X108 STRL (DRAPES) ×4
DRAPE SURG ORHT 6 SPLT 77X108 (DRAPES) ×3 IMPLANT
DRSG ADAPTIC 3X8 NADH LF (GAUZE/BANDAGES/DRESSINGS) ×4 IMPLANT
DRSG MEPILEX BORDER 4X4 (GAUZE/BANDAGES/DRESSINGS) ×4 IMPLANT
ELECT REM PT RETURN 9FT ADLT (ELECTROSURGICAL) ×4
ELECTRODE REM PT RTRN 9FT ADLT (ELECTROSURGICAL) ×3 IMPLANT
GAUZE 4X4 16PLY RFD (DISPOSABLE) ×8 IMPLANT
GLOVE BIO SURGEON STRL SZ7.5 (GLOVE) ×8 IMPLANT
GLOVE ECLIPSE 6.5 STRL STRAW (GLOVE) ×8 IMPLANT
GLOVE SRG 8 PF TXTR STRL LF DI (GLOVE) ×6 IMPLANT
GLOVE SURG UNDER POLY LF SZ6.5 (GLOVE) ×16 IMPLANT
GLOVE SURG UNDER POLY LF SZ7 (GLOVE) ×4 IMPLANT
GLOVE SURG UNDER POLY LF SZ8 (GLOVE) ×8
GOWN STRL REUS W/ TWL LRG LVL3 (GOWN DISPOSABLE) ×18 IMPLANT
GOWN STRL REUS W/ TWL XL LVL3 (GOWN DISPOSABLE) ×3 IMPLANT
GOWN STRL REUS W/TWL LRG LVL3 (GOWN DISPOSABLE) ×24
GOWN STRL REUS W/TWL XL LVL3 (GOWN DISPOSABLE) ×4
GRAFT GORETEX STRT 4-7X45 (Vascular Products) ×4 IMPLANT
KIT BASIN OR (CUSTOM PROCEDURE TRAY) ×4 IMPLANT
KIT PALINDROME-P 55CM (CATHETERS) IMPLANT
KIT TURNOVER KIT B (KITS) ×4 IMPLANT
NEEDLE 18GX1X1/2 (RX/OR ONLY) (NEEDLE) ×4 IMPLANT
NEEDLE HYPO 25GX1X1/2 BEV (NEEDLE) ×4 IMPLANT
NS IRRIG 1000ML POUR BTL (IV SOLUTION) ×4 IMPLANT
PACK CV ACCESS (CUSTOM PROCEDURE TRAY) ×4 IMPLANT
PACK SURGICAL SETUP 50X90 (CUSTOM PROCEDURE TRAY) ×4 IMPLANT
PAD ARMBOARD 7.5X6 YLW CONV (MISCELLANEOUS) ×8 IMPLANT
SET MICROPUNCTURE 5F STIFF (MISCELLANEOUS) ×4 IMPLANT
SPONGE LAP 18X18 RF (DISPOSABLE) ×4 IMPLANT
SPONGE SURGIFOAM ABS GEL 100 (HEMOSTASIS) IMPLANT
SUT ETHILON 3 0 PS 1 (SUTURE) ×4 IMPLANT
SUT PROLENE 6 0 BV (SUTURE) ×20 IMPLANT
SUT VIC AB 3-0 SH 27 (SUTURE) ×12
SUT VIC AB 3-0 SH 27X BRD (SUTURE) ×9 IMPLANT
SUT VICRYL 4-0 PS2 18IN ABS (SUTURE) ×16 IMPLANT
SYR 10ML LL (SYRINGE) ×8 IMPLANT
SYR 20ML LL LF (SYRINGE) ×8 IMPLANT
SYR 5ML LL (SYRINGE) ×12 IMPLANT
SYR CONTROL 10ML LL (SYRINGE) ×4 IMPLANT
SYR TOOMEY 50ML (SYRINGE) IMPLANT
TOWEL GREEN STERILE (TOWEL DISPOSABLE) ×8 IMPLANT
TOWEL GREEN STERILE FF (TOWEL DISPOSABLE) ×8 IMPLANT
UNDERPAD 30X36 HEAVY ABSORB (UNDERPADS AND DIAPERS) ×4 IMPLANT
WATER STERILE IRR 1000ML POUR (IV SOLUTION) ×4 IMPLANT

## 2020-07-12 NOTE — Discharge Instructions (Signed)
[facility]  Discharge Instructions  AV Fistula or Graft Surgery for Dialysis Access  Please refer to the following instructions for your post-procedure care. Your surgeon or physician assistant will discuss any changes with you.  Activity  You may drive the day following your surgery, if you are comfortable and no longer taking prescription pain medication. Resume full activity as the soreness in your incision resolves.  Bathing/Showering  You may shower after you go home. Keep your incision dry for 48 hours. Do not soak in a bathtub, hot tub, or swim until the incision heals completely. You may not shower if you have a hemodialysis catheter.  Incision Care  Clean your incision with mild soap and water after 48 hours. Pat the area dry with a clean towel. You do not need a bandage unless otherwise instructed. Do not apply any ointments or creams to your incision. You may have skin glue on your incision. Do not peel it off. It will come off on its own in about one week. Your arm may swell a bit after surgery. To reduce swelling use pillows to elevate your arm so it is above your heart. Your doctor will tell you if you need to lightly wrap your arm with an ACE bandage.  Diet  Resume your normal diet. There are not special food restrictions following this procedure. In order to heal from your surgery, it is CRITICAL to get adequate nutrition. Your body requires vitamins, minerals, and protein. Vegetables are the best source of vitamins and minerals. Vegetables also provide the perfect balance of protein. Processed food has little nutritional value, so try to avoid this.  Medications  Resume taking all of your medications. If your incision is causing pain, you may take over-the counter pain relievers such as acetaminophen (Tylenol). If you were prescribed a stronger pain medication, please be aware these medications can cause nausea and constipation. Prevent  nausea by taking the medication with a snack or meal. Avoid constipation by drinking plenty of fluids and eating foods with high amount of fiber, such as fruits, vegetables, and grains.   Do not take Tylenol if you are taking prescription pain medications.  Follow up Your surgeon may want to see you in the office following your access surgery. If so, this will be arranged at the time of your surgery.  Please call us immediately for any of the following conditions:   Increased pain, redness, drainage (pus) from your incision site  Fever of 101 degrees or higher  Severe or worsening pain at your incision site  Hand pain or numbness.   Reduce your risk of vascular disease:   Stop smoking. If you would like help, call [facility] at 1-800-QUIT-NOW 223-021-9686) or [place] at [place] your cholesterol  Maintain a desired weight  Control your diabetes  Keep your blood pressure down  Dialysis  It will take several weeks to several months for your new dialysis access to be ready for use. Your surgeon will determine when it is okay to use it. Your nephrologist will continue to direct your dialysis. You can continue to use your Permcath until your new access is ready for use.   07/12/2020 [name]. [id number] Aug 06, 1945  Surgeon(s): Angelia Mould, MD  Procedure(s): INSERTION OF RIGHT ARM ARTERIOVENOUS (AV) GORE-TEX GRAFT LEFT INTERNAL JUGULAR TUNNELED DIALYSIS CATHETER INSERTION REMOVAL OF RIGHT TUNNELED INTERNAL JUGULAR DIALYSIS CATHETER   May stick graft immediately   May stick graft  on designated area only:   X Do not stick right AV graft for 6  weeks    If you have any questions, please call the office at [phone].

## 2020-07-12 NOTE — Anesthesia Preprocedure Evaluation (Addendum)
Anesthesia Evaluation  Patient identified by MRN, date of birth, ID band Patient awake    Reviewed: Allergy & Precautions, NPO status , Patient's Chart, lab work & pertinent test results  Airway Mallampati: II  TM Distance: >3 FB Neck ROM: Full    Dental  (+) Edentulous Upper, Poor Dentition, Missing, Loose,    Pulmonary COPD, Current Smoker,    breath sounds clear to auscultation       Cardiovascular hypertension, Pt. on medications + CAD, + Past MI, + Peripheral Vascular Disease and +CHF  + dysrhythmias Atrial Fibrillation  Rhythm:Regular Rate:Normal     Neuro/Psych PSYCHIATRIC DISORDERS Depression negative neurological ROS     GI/Hepatic GERD  Medicated,(+) Hepatitis -  Endo/Other  diabetesHypothyroidism   Renal/GU ESRF and DialysisRenal disease     Musculoskeletal  (+) Arthritis ,   Abdominal Normal abdominal exam  (+)   Peds  Hematology   Anesthesia Other Findings - HLD  Reproductive/Obstetrics                           Anesthesia Physical Anesthesia Plan  ASA: III  Anesthesia Plan: Regional   Post-op Pain Management:    Induction: Intravenous  PONV Risk Score and Plan: Propofol infusion  Airway Management Planned: Natural Airway and Simple Face Mask  Additional Equipment: None  Intra-op Plan:   Post-operative Plan:   Informed Consent: I have reviewed the patients History and Physical, chart, labs and discussed the procedure including the risks, benefits and alternatives for the proposed anesthesia with the patient or authorized representative who has indicated his/her understanding and acceptance.     Dental advisory given  Plan Discussed with: CRNA  Anesthesia Plan Comments:        Anesthesia Quick Evaluation

## 2020-07-12 NOTE — Progress Notes (Signed)
Spoke with [name] (step [phone]. They will be here to pick him up within an hour. They asked why someone needed to stay with him. I explained how he had anesthesia. She stated, "oh yea we will take care of him."  [name], Marsh Dolly, RN

## 2020-07-12 NOTE — Transfer of Care (Signed)
Immediate Anesthesia Transfer of Care Note  Patient: [name].  Procedure(s) Performed: INSERTION OF RIGHT ARM ARTERIOVENOUS (AV) GORE-TEX GRAFT (Right Arm Upper) LEFT INTERNAL JUGULAR TUNNELED DIALYSIS CATHETER INSERTION (Left Neck) REMOVAL OF RIGHT TUNNELED INTERNAL JUGULAR DIALYSIS CATHETER (Right Neck)  Patient Location: PACU  Anesthesia Type:MAC and Regional  Level of Consciousness: awake and alert   Airway & Oxygen Therapy: Patient Spontanous Breathing and Patient connected to face mask oxygen  Post-op Assessment: Report given to RN and Post -op Vital signs reviewed and stable  Post vital signs: Reviewed and stable  Last Vitals:  Vitals Value Taken Time  BP 134/43 07/12/20 1505  Temp    Pulse 68 07/12/20 1507  Resp 35 07/12/20 1507  SpO2 99 % 07/12/20 1507  Vitals shown include unvalidated device data.  Last Pain:  Vitals:   07/12/20 0922  PainSc: 10-Worst pain ever      Patients Stated Pain Goal: 3 (43/83/81 8403)  Complications: No complications documented.

## 2020-07-12 NOTE — Progress Notes (Signed)
CBG at 1113 39. Pt not currently taking any medications for DM. Results called to Dr. Smith Robert- verbal order for 1 amp of dextrose 50% IV received.  54mL dextrose 50% solution given IV push at 1123.

## 2020-07-12 NOTE — Interval H&P Note (Signed)
History and Physical Interval Note:  07/12/2020 10:39 AM  [name].  has presented today for surgery, with the diagnosis of ESRD.  The various methods of treatment have been discussed with the patient and family. After consideration of risks, benefits and other options for treatment, the patient has consented to  Procedure(s): INSERTION OF RIGHT ARM ARTERIOVENOUS (AV) GORE-TEX GRAFT (Right) INSERTION OF TUNNELED DIALYSIS CATHETER (N/A) as a surgical intervention.  The patient's history has been reviewed, patient examined, no change in status, stable for surgery.  I have reviewed the patient's chart and labs.  Questions were answered to the patient's satisfaction.     [name]

## 2020-07-12 NOTE — Anesthesia Procedure Notes (Signed)
Anesthesia Regional Block: Supraclavicular block   Pre-Anesthetic Checklist: ,, timeout performed, Correct Patient, Correct Site, Correct Laterality, Correct Procedure, Correct Position, site marked, Risks and benefits discussed,  Surgical consent,  Pre-op evaluation,  At surgeon's request and post-op pain management  Laterality: Right  Prep: chloraprep       Needles:  Injection technique: Single-shot  Needle Type: Echogenic Stimulator Needle     Needle Length: 9cm  Needle Gauge: 21     Additional Needles:   Procedures:,,,, ultrasound used (permanent image in chart),,,,  Narrative:  Start time: 07/12/2020 11:40 AM End time: 07/12/2020 11:45 AM Injection made incrementally with aspirations every 5 mL.  Performed by: Personally  Anesthesiologist: Effie Berkshire, MD  Additional Notes: Patient tolerated the procedure well. Local anesthetic introduced in an incremental fashion under minimal resistance after negative aspirations. No paresthesias were elicited. After completion of the procedure, no acute issues were identified and patient continued to be monitored by RN.

## 2020-07-12 NOTE — Op Note (Signed)
NAME: [name].    MRN: [id number] DOB: 09-04-1945    DATE OF OPERATION: 07/12/2020  PREOP DIAGNOSIS:    End-stage renal disease  POSTOP DIAGNOSIS:    Same  PROCEDURE:    1. Ultrasound-guided placement of left IJ 28 cm tunneled dialysis catheter 2. Removal of right IJ tunneled dialysis catheter 3. Placement of new right upper arm AV graft (4-7 mm PTFE graft)  SURGEON: Judeth Cornfield. Scot Dock, MD  ASSIST: Karoline Caldwell, PA  ANESTHESIA: Axillary block and local with sedation  EBL: Minimal  INDICATIONS:    [name]. is a 75 y.o. male who presents for new access. He had a nonfunctioning right IJ catheter we were asked to place a new catheter new access.  FINDINGS:   Excellent thrill in his right upper arm graft at the completion of the procedure with a palpable radial pulse.  TECHNIQUE:   The patient was taken to the operating room and was sedated. Of note a axillary block had been done. The right arm, axilla neck and upper chest were prepped and draped in usual sterile fashion. Under ultrasound guidance, after the skin was anesthetized, I cannulated the left IJ with a micropuncture needle and a micropuncture sheath was introduced over a wire. I then advanced the J-wire into the right atrium. I selected a 28 cm catheter. The exit site for the catheter was selected and the skin anesthetized between the 2 areas. The catheter was then brought through the tunnel. I then dilated the track over the wire and then the dilator and peel-away sheath were advanced over the wire. The wire and dilator were removed. The catheter was passed through the peel-away sheath and positioned at the cavoatrial junction. Both ports withdrew easily with and flushed with heparinized saline and filled with concentrated heparin. The catheter was secured at its exit site with a 3-0 nylon suture. The IJ cannulation site was closed with a 4-0 subcuticular stitch. Sterile dressing was applied.  On  the right side after the skin was anesthetized I bluntly dissected free the cuff and the right tunneled catheter was removed and pressure held for hemostasis. I confirmed that this did not change the position of the catheter on the left side. Pressure was held for hemostasis.  Attention was then turned to the right arm. After the skin was anesthetized a longitudinal incision was made over the brachial artery above the antecubital level. Here the adjacent veins were quite small. I elected to place an upper arm graft. The brachial artery was dissected free beneath the fascia. A separate longitudinal incision was made beneath the axilla after the skin was anesthetized. Here the high brachial vein was dissected free. A4-7 mm PTFE graft was tunneled between the 2 incisions. The patient was heparinized. The brachial artery was clamped proximally and distally and a longitudinal arteriotomy was made. A short segment of the 4 mm of the graft was excised, the graft spatulated and sewn into side to the brachial artery using continuous 6-0 Prolene suture. The graft imported appropriate length for anastomosis to the high brachial vein. The vein was ligated distally and spatulated proximally. The graft cut the appropriate length spatulated and sewn into into the vein using continuous 6-0 Prolene suture. At the completion there was an excellent thrill in the graft and a palpable radial pulse. Hemostasis was obtained in the wounds and the heparin was partially reversed with protamine. The wound was closed with a deep layer of 3-0 Vicryl the skin  closed with 4-0 Vicryl. Dermabond was applied. The patient tolerated the procedure well was transferred to the recovery room in stable condition. All needle and sponge counts were correct.  Given the complexity of the case a first assistant was necessary in order to expedient the procedure and safely perform the technical aspects of the operation.  [name], MD,  FACS [facility]  DATE OF DICTATION:   07/12/2020

## 2020-07-12 NOTE — Anesthesia Postprocedure Evaluation (Signed)
Anesthesia Post Note  Patient: [name].  Procedure(s) Performed: INSERTION OF RIGHT ARM ARTERIOVENOUS (AV) GORE-TEX GRAFT (Right Arm Upper) LEFT INTERNAL JUGULAR TUNNELED DIALYSIS CATHETER INSERTION (Left Neck) REMOVAL OF RIGHT TUNNELED INTERNAL JUGULAR DIALYSIS CATHETER (Right Neck)     Patient location during evaluation: PACU Anesthesia Type: MAC Level of consciousness: awake and alert Pain management: pain level controlled Vital Signs Assessment: post-procedure vital signs reviewed and stable Respiratory status: spontaneous breathing, nonlabored ventilation, respiratory function stable and patient connected to nasal cannula oxygen Cardiovascular status: stable and blood pressure returned to baseline Postop Assessment: no apparent nausea or vomiting Anesthetic complications: no   No complications documented.  Last Vitals:  Vitals:   07/12/20 1520 07/12/20 1535  BP: (!) 98/51 (!) 104/50  Pulse: 71 73  Resp: (!) 23 18  Temp:  (!) 36.3 C  SpO2: 94% 94%    Last Pain:  Vitals:   07/12/20 1535  PainSc: 0-No pain                 Effie Berkshire

## 2020-07-12 NOTE — Anesthesia Procedure Notes (Signed)
Procedure Name: MAC Date/Time: 07/12/2020 12:29 PM Performed by: Hoy Morn, CRNA Pre-anesthesia Checklist: Patient identified, Emergency Drugs available, Suction available and Patient being monitored Patient Re-evaluated:Patient Re-evaluated prior to induction Oxygen Delivery Method: Simple face mask Placement Confirmation: positive ETCO2 Dental Injury: Teeth and Oropharynx as per pre-operative assessment

## 2020-07-12 NOTE — Progress Notes (Signed)
Patient arrived to surgical short stay for his procedure. Pt states he fell on the tile floor of his bathroom this morning and hit the back of his head, his L shoulder and L elbow. L elbow with large abrasion. Patient states his son and daughter in law helped him off the floor and bandaged his elbow and brought him to short stay. Paged Dr. Scot Dock and informed of the above. Patient states his head is "sore" where he hit it, as well as pain in the L shoulder. Patient rates pain 10/10 but states he has 10/10 pain all over. Verbal orders given for xray of patients L shoulder. Orders placed for radiology.

## 2020-07-13 ENCOUNTER — Encounter (HOSPITAL_COMMUNITY): Payer: Self-pay | Admitting: Emergency Medicine

## 2020-07-13 ENCOUNTER — Emergency Department (HOSPITAL_COMMUNITY): Payer: Medicare HMO

## 2020-07-13 ENCOUNTER — Emergency Department (HOSPITAL_COMMUNITY)
Admission: EM | Admit: 2020-07-13 | Discharge: 2020-07-14 | Disposition: A | Discharge status: Home / Self Care | Payer: Medicare HMO | Attending: Emergency Medicine | Admitting: Emergency Medicine

## 2020-07-13 ENCOUNTER — Other Ambulatory Visit: Payer: Self-pay

## 2020-07-13 DIAGNOSIS — M47816 Spondylosis without myelopathy or radiculopathy, lumbar region: Secondary | ICD-10-CM | POA: Diagnosis not present

## 2020-07-13 DIAGNOSIS — I2510 Atherosclerotic heart disease of native coronary artery without angina pectoris: Secondary | ICD-10-CM | POA: Diagnosis not present

## 2020-07-13 DIAGNOSIS — N186 End stage renal disease: Secondary | ICD-10-CM | POA: Insufficient documentation

## 2020-07-13 DIAGNOSIS — Z20822 Contact with and (suspected) exposure to covid-19: Secondary | ICD-10-CM | POA: Diagnosis not present

## 2020-07-13 DIAGNOSIS — I5032 Chronic diastolic (congestive) heart failure: Secondary | ICD-10-CM | POA: Insufficient documentation

## 2020-07-13 DIAGNOSIS — F17210 Nicotine dependence, cigarettes, uncomplicated: Secondary | ICD-10-CM | POA: Diagnosis not present

## 2020-07-13 DIAGNOSIS — I132 Hypertensive heart and chronic kidney disease with heart failure and with stage 5 chronic kidney disease, or end stage renal disease: Secondary | ICD-10-CM | POA: Insufficient documentation

## 2020-07-13 DIAGNOSIS — M549 Dorsalgia, unspecified: Secondary | ICD-10-CM | POA: Diagnosis not present

## 2020-07-13 DIAGNOSIS — J449 Chronic obstructive pulmonary disease, unspecified: Secondary | ICD-10-CM | POA: Diagnosis not present

## 2020-07-13 DIAGNOSIS — Z8551 Personal history of malignant neoplasm of bladder: Secondary | ICD-10-CM | POA: Diagnosis not present

## 2020-07-13 DIAGNOSIS — M25519 Pain in unspecified shoulder: Secondary | ICD-10-CM | POA: Diagnosis not present

## 2020-07-13 DIAGNOSIS — E1122 Type 2 diabetes mellitus with diabetic chronic kidney disease: Secondary | ICD-10-CM | POA: Insufficient documentation

## 2020-07-13 DIAGNOSIS — K4090 Unilateral inguinal hernia, without obstruction or gangrene, not specified as recurrent: Secondary | ICD-10-CM | POA: Insufficient documentation

## 2020-07-13 DIAGNOSIS — M48061 Spinal stenosis, lumbar region without neurogenic claudication: Secondary | ICD-10-CM | POA: Diagnosis not present

## 2020-07-13 DIAGNOSIS — R52 Pain, unspecified: Secondary | ICD-10-CM | POA: Diagnosis not present

## 2020-07-13 DIAGNOSIS — Z992 Dependence on renal dialysis: Secondary | ICD-10-CM | POA: Insufficient documentation

## 2020-07-13 DIAGNOSIS — Z7982 Long term (current) use of aspirin: Secondary | ICD-10-CM | POA: Diagnosis not present

## 2020-07-13 DIAGNOSIS — M4319 Spondylolisthesis, multiple sites in spine: Secondary | ICD-10-CM | POA: Diagnosis not present

## 2020-07-13 DIAGNOSIS — E039 Hypothyroidism, unspecified: Secondary | ICD-10-CM | POA: Diagnosis not present

## 2020-07-13 DIAGNOSIS — K4091 Unilateral inguinal hernia, without obstruction or gangrene, recurrent: Secondary | ICD-10-CM

## 2020-07-13 DIAGNOSIS — I10 Essential (primary) hypertension: Secondary | ICD-10-CM | POA: Diagnosis not present

## 2020-07-13 LAB — CBC
HCT: 34.2 % — ABNORMAL LOW (ref 39.0–52.0)
Hemoglobin: 10.2 g/dL — ABNORMAL LOW (ref 13.0–17.0)
MCH: 27.4 pg (ref 26.0–34.0)
MCHC: 29.8 g/dL — ABNORMAL LOW (ref 30.0–36.0)
MCV: 91.9 fL (ref 80.0–100.0)
Platelets: 125 10*3/uL — ABNORMAL LOW (ref 150–400)
RBC: 3.72 MIL/uL — ABNORMAL LOW (ref 4.22–5.81)
RDW: 21.8 % — ABNORMAL HIGH (ref 11.5–15.5)
WBC: 14.7 10*3/uL — ABNORMAL HIGH (ref 4.0–10.5)
nRBC: 0 % (ref 0.0–0.2)

## 2020-07-13 LAB — LACTIC ACID, PLASMA: Lactic Acid, Venous: 1.7 mmol/L (ref 0.5–1.9)

## 2020-07-13 LAB — RESP PANEL BY RT-PCR (FLU A&B, COVID) ARPGX2
Influenza A by PCR: NEGATIVE
Influenza B by PCR: NEGATIVE
SARS Coronavirus 2 by RT PCR: NEGATIVE

## 2020-07-13 LAB — BASIC METABOLIC PANEL
Anion gap: 20 — ABNORMAL HIGH (ref 5–15)
BUN: 54 mg/dL — ABNORMAL HIGH (ref 8–23)
CO2: 17 mmol/L — ABNORMAL LOW (ref 22–32)
Calcium: 8.6 mg/dL — ABNORMAL LOW (ref 8.9–10.3)
Chloride: 101 mmol/L (ref 98–111)
Creatinine, Ser: 7.32 mg/dL — ABNORMAL HIGH (ref 0.61–1.24)
GFR, Estimated: 7 mL/min — ABNORMAL LOW (ref >60)
Glucose, Bld: 145 mg/dL — ABNORMAL HIGH (ref 70–99)
Potassium: 5.4 mmol/L — ABNORMAL HIGH (ref 3.5–5.1)
Sodium: 138 mmol/L (ref 135–145)

## 2020-07-13 MED ORDER — DICLOFENAC SODIUM 1 % EX GEL: 2.0000 g | Freq: Four times a day (QID) | CUTANEOUS | Status: DC | PRN

## 2020-07-13 MED ORDER — GABAPENTIN 100 MG PO CAPS
100.0000 mg | ORAL_CAPSULE | Freq: Every day | ORAL | Status: DC
Start: 2020-07-13 — End: 2020-07-14
  Filled 2020-07-13: qty 1

## 2020-07-13 MED ORDER — IOHEXOL 300 MG/ML  SOLN
80.0000 mL | Freq: Once | INTRAMUSCULAR | Status: AC | PRN
  Administered 2020-07-13: 80 mL via INTRAVENOUS

## 2020-07-13 MED ORDER — HEPARIN SODIUM (PORCINE) 1000 UNIT/ML IJ SOLN
INTRAMUSCULAR | Status: AC
  Filled 2020-07-13: qty 4

## 2020-07-13 MED ORDER — AMIODARONE HCL 200 MG PO TABS
200.0000 mg | ORAL_TABLET | Freq: Every day | ORAL | Status: DC
  Administered 2020-07-14: 200 mg via ORAL
  Filled 2020-07-13: qty 1

## 2020-07-13 MED ORDER — DOCUSATE SODIUM 100 MG PO CAPS
100.0000 mg | ORAL_CAPSULE | Freq: Every day | ORAL | Status: DC
  Administered 2020-07-14: 100 mg via ORAL
  Filled 2020-07-13: qty 1

## 2020-07-13 MED ORDER — SODIUM ZIRCONIUM CYCLOSILICATE 10 G PO PACK
10.0000 g | PACK | Freq: Once | ORAL | Status: AC
  Administered 2020-07-13: 10 g via ORAL
  Filled 2020-07-13: qty 1

## 2020-07-13 MED ORDER — INSULIN ASPART 100 UNIT/ML ~~LOC~~ SOLN
0.0000 [IU] | Freq: Three times a day (TID) | SUBCUTANEOUS | Status: DC
  Administered 2020-07-14: 2 [IU] via SUBCUTANEOUS

## 2020-07-13 MED ORDER — LIDOCAINE 5 % EX PTCH: 1.0000 | MEDICATED_PATCH | Freq: Every day | CUTANEOUS | Status: DC | PRN

## 2020-07-13 MED ORDER — HYDROXYZINE HCL 25 MG PO TABS
25.0000 mg | ORAL_TABLET | Freq: Once | ORAL | Status: AC
  Administered 2020-07-13: 25 mg via ORAL

## 2020-07-13 MED ORDER — SODIUM CHLORIDE 0.9% FLUSH
3.0000 mL | Freq: Once | INTRAVENOUS | Status: AC
  Administered 2020-07-13: 3 mL via INTRAVENOUS

## 2020-07-13 MED ORDER — ATORVASTATIN CALCIUM 10 MG PO TABS: 80.0000 mg | ORAL_TABLET | Freq: Every day | ORAL | Status: DC

## 2020-07-13 MED ORDER — HYDROXYZINE HCL 25 MG PO TABS
ORAL_TABLET | ORAL | Status: AC
  Filled 2020-07-13: qty 1

## 2020-07-13 MED ORDER — FERROUS SULFATE 325 (65 FE) MG PO TABS
325.0000 mg | ORAL_TABLET | Freq: Every day | ORAL | Status: DC
  Administered 2020-07-14: 325 mg via ORAL
  Filled 2020-07-13: qty 1

## 2020-07-13 MED ORDER — EZETIMIBE 10 MG PO TABS
10.0000 mg | ORAL_TABLET | Freq: Every day | ORAL | Status: DC
  Filled 2020-07-13 (×2): qty 1

## 2020-07-13 MED ORDER — PANTOPRAZOLE SODIUM 40 MG PO TBEC
40.0000 mg | DELAYED_RELEASE_TABLET | Freq: Every day | ORAL | Status: DC
  Filled 2020-07-13: qty 1

## 2020-07-13 MED ORDER — CHLORHEXIDINE GLUCONATE CLOTH 2 % EX PADS: 6.0000 | MEDICATED_PAD | Freq: Every day | CUTANEOUS | Status: DC

## 2020-07-13 NOTE — Procedures (Signed)
Asked to see patient for dialysis. Pt missed HD today, last HD Thursday. He fell yest morning, doesn't know why. Says he "can"t walk" and lives by himself so can't go home.  Also c/o rash on the back w/ black spots that are very itchy. No fevers.  R arm sling in place. Also he got surgery yesterday w/ a new LUA AVG and a new L chest TDC. Has chronic abd hernia.  Pt will be dialyzed and sent back to ED.  If pt is admitted please call and we will do a formal consult.   I was present at this dialysis session, have reviewed the session itself and made  appropriate changes Kelly Splinter MD [facility] pager [phone]   07/13/2020, 6:48 PM

## 2020-07-13 NOTE — ED Notes (Signed)
Report given to Hemodialysis. Ticket placed for transport

## 2020-07-13 NOTE — ED Triage Notes (Signed)
Pt states he fell yesterday morning.  Unsure what caused him to fall.  Denies LOC.  Reports pain to L shoulder.  States he came to hospital and had x-rays of L shoulder prior to his scheduled surgery for fistula placement.  Never got results of x-ray.  Also reports unable to lift R arm since fistula placed yesterday.  R sling in place.  Able to move fingers and normal sensation.

## 2020-07-13 NOTE — ED Provider Notes (Signed)
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 colon anastomosis staple line at the level of the junction  of the descending and proximal sigmoid colon in the left pelvis. There is no colonic wall thickening. No inflammation. Vascular/Lymphatic: Extensive aortic atherosclerosis. Aorta is thrombosed/occluded beginning just below the left renal artery. More distally the aorta is small irregular densely calcified. There is enhancement of the distal common iliac arteries and the external and internal iliac arteries. Patient has a left axillary femoral and a by femoral artery graft. No enlarged retroperitoneal, mesenteric or pelvic lymph nodes. Reproductive: Prostate mildly enlarged, 3.9 x 3.3 cm. Other: No ascites. Musculoskeletal: No acute fracture.  No bone lesion. LUMBAR SPINE CT Alignment: Mild dextroscoliosis, apex at L2-L3. Grade 1 anterolisthesis, which is a spondylo lysis, of L5 on S1 due to bilateral chronic pars defects. Slight retrolisthesis of L4 on L5 and L1 on L2. Osseous structures: Diffusely  demineralized. No fracture. No osteoblastic or osteolytic lesions. Disc levels: T11-T12 and T12-L1. Moderate to marked loss of disc height. Mild diffuse disc bulging. No disc herniations. L1-L2: Marked loss of disc height with endplate spurring and sclerosis. Diffuse disc bulging. Moderate central stenosis with significant narrowing of the superolateral recesses. Moderate to severe bilateral neural foraminal narrowing. L2-L3: Marked loss of disc height. Diffuse disc bulging with endplate spurring. No convincing disc herniation. Ligamentum flavum enlargement. Moderate central stenosis and superolateral recess narrowing with moderate to severe left and moderate right neural foraminal narrowing. L3-L4: Marked loss of disc height. Diffuse spondylotic disc bulging with endplate spurring. No convincing disc herniation. Mild facet degenerative changes. Mild central stenosis with right greater than left superolateral recess narrowing and moderate bilateral neural foraminal narrowing. L4-L5: Moderate loss of disc height. Diffuse disc bulging. No convincing disc herniation. Bilateral facet degenerative change. Moderate bilateral neural foraminal narrowing. L5-S1: Moderate to marked loss of disc height. Facet productive changes. The bilateral pars defects combine with disc degenerative changes with endplate spurring, particularly on the right, to cause severe right and moderate left neural foraminal narrowing. Surrounding soft tissues. Scattered calcifications noted in normal size psoas muscles. Occluded infrarenal abdominal aorta, small and densely calcified. No acute soft tissue abnormality. IMPRESSION: CT ABDOMEN AND PELVIS 1. Findings of metastatic disease including pleural based right lung base nodules and numerous ill-defined hypoattenuating liver masses. There is also an enlarged lymph node just above the esophageal hiatus, to the right of the distal esophagus in the posteroinferior right mediastinum. These findings may  be from the reported history of bladder carcinoma. 2. No acute findings within the abdomen or pelvis. 3. Large right inguinal hernia containing small bowel with no evidence of bowel obstruction, incarceration or strangulation. 4. Chronically occluded infrarenal abdominal aorta. Patient has a left axillary femoral bypass graft and a by femoral bypass graft. Flow to the lower extremities is mostly through collaterals, however. LUMBAR SPINE CT 1. No fracture or acute finding. No osteoblastic or osteolytic bone lesions. 2. Chronic bilateral pars defects at L5-S1 with a grade 1 anterolisthesis. 3. Diffuse and extensive disc and facet degenerative changes with varying degrees of tricompartmental stenosis. Electronically Signed   By: Lajean Manes M.D.   On: 07/13/2020 14:45   DG Chest Port 1 View  Result Date: 07/12/2020 CLINICAL DATA:  Dialysis catheter placement EXAM: PORTABLE CHEST 1 VIEW COMPARISON:  02/24/2020 chest radiograph. FINDINGS: Left internal jugular central venous catheter terminates in the lower third of the SVC. Stable cardiomediastinal silhouette with top-normal heart size. No pneumothorax. No pleural effusion. Patchy hazy and reticular opacities throughout peripheral right greater than left lungs, similar to mildly worsened. IMPRESSION: Left internal jugular central  venous catheter terminates in the lower third of the SVC. No pneumothorax. Chronic patchy hazy and reticular opacities throughout the peripheral right greater than left lungs, similar to mildly worsened, favoring interstitial lung disease with superimposed mild pulmonary edema or atypical infection not excluded. Electronically Signed   By: Ilona Sorrel M.D.   On: 07/12/2020 15:24   DG Shoulder Left  Result Date: 07/12/2020 CLINICAL DATA:  [place] this morning landing on left shoulder. Limited range of motion. EXAM: LEFT SHOULDER - 2+ VIEW COMPARISON:  None. FINDINGS: There is no evidence of fracture or dislocation. Loss of subacromial  space suspicious for rotator cuff tear. Soft tissues are unremarkable. Dialysis catheter partially visualized. IMPRESSION: 1. No fracture or dislocation. 2. Loss of subacromial space suspicious for rotator cuff tear of uncertain chronicity. Electronically Signed   By: Miachel Roux M.D.   On: 07/12/2020 10:35   DG Fluoro Guide CV Line-No Report  Result Date: 07/12/2020 Fluoroscopy was utilized by the requesting physician.  No radiographic interpretation.    Procedures Procedures (including critical care time)  Medications Ordered in ED Medications  Chlorhexidine Gluconate Cloth 2 % PADS 6 each (has no administration in time range)  hydrOXYzine (ATARAX/VISTARIL) 25 MG tablet (has no administration in time range)  heparin sodium (porcine) 1000 UNIT/ML injection (has no administration in time range)  sodium chloride flush (NS) 0.9 % injection 3 mL (3 mLs Intravenous Given 07/13/20 1257)  sodium zirconium cyclosilicate (LOKELMA) packet 10 g (10 g Oral Given 07/13/20 1256)  iohexol (OMNIPAQUE) 300 MG/ML solution 80 mL (80 mLs Intravenous Contrast Given 07/13/20 1421)  hydrOXYzine (ATARAX/VISTARIL) tablet 25 mg (25 mg Oral Given 07/13/20 1810)    ED Course  I have reviewed the triage vital signs and the nursing notes.  Pertinent labs & imaging results that were available during my care of the patient were reviewed by me and considered in my medical decision making (see chart for details).    MDM Rules/Calculators/A&P                          [name]. is a 75 year old male with history of atrial fibrillation, COPD, end-stage renal disease on hemodialysis, chronic respiratory failure on 2 L of oxygen who presents to the ED with multiple complaints.  Having some pain in his left shoulder had an x-ray yesterday that was normal.  Suspect arthritic changes there.  Had fistula placed yesterday in his right upper arm as well as new left-sided tunneled catheter for dialysis yesterday.  Both those  sites appear well he is having some mild discomfort in those areas.  His main concern is from his right inguinal fold area where he has large defect and has hernia there.  He has been having some increased pain there.  Part of the hernia appears to be reducible.  Although defect is large.  CT scan was obtained that showed no signs of strangulation or incarceration.  Lab work otherwise was unremarkable.  Potassium is mildly elevated.  He missed dialysis today and talked with nephrology who states that they are able to perform dialysis today.  Patient also concerned about living at home by himself at this point.  He is unable to take care of himself and does not feel safe going home.  He has been having some chronic back pain and leg pain and mostly uses a wheelchair but is having difficulty with transfers now.  Overall patient does not have any support at  home which with all of his chronic medical problems he likely should have.  I have contacted social work and case management.  Patient currently at dialysis now.  Will reevaluate afterwards but suspect that I will place a PT OT evaluation and have case management and social work help with arranging home health services and may be even placement.  Of note, incidentally there are findings consistent with metastatic disease in the pleural part of the right lung with also some liver masses and enlarged lymph node near the esophageal hiatus.  Suspect that these are likely from bladder carcinoma.  Will need to follow-up with oncology about this as well.  Patient back from dialysis.  Overall has had some chronic decompensation.  Lives by himself.  Not able to take care of himself despite having a wheelchair and other assistive devices at home.  There is no acute indication for hospital admission at this time as well.  Suspect that he might do well with home health versus baby being placed.  This chart was dictated using voice recognition software.  Despite best  efforts to proofread,  errors can occur which can change the documentation meaning.    Final Clinical Impression(s) / ED Diagnoses Final diagnoses:  Back pain  Unilateral recurrent inguinal hernia without obstruction or gangrene    Rx / DC Orders ED Discharge Orders    None       Lennice Sites, DO 07/13/20 1953

## 2020-07-13 NOTE — ED Notes (Signed)
Pt to dialysis.

## 2020-07-13 NOTE — Progress Notes (Signed)
   07/13/20 2303  Clinical Encounter Type  Visited With Patient  Visit Type Initial;Social support  Stress Factors  Patient Stress Factors Health changes   Chaplain engaged in active listening and provided emotional support. Pt stated he was cold and was unable to sleep in the hallway. Chaplain offered to grab Pt another blanket and he declined. Pt requested to speak with his nurse. Chaplain spoke with Pt's nurse, Lenna Sciara, and she is aware. Chaplain remains available as needed.  This note was prepared by Chaplain Resident, Dante Gang, MDiv. Chaplain remains available as needed through the on-call pager: [phone].

## 2020-07-13 NOTE — Discharge Instructions (Addendum)
Please follow-up with your oncologist.  There are changes in your lung area as well as your liver that are concerning for possibly metastasis.  Please discuss with them any further images and care that you may need.  Home health has been ordered and they have secured a nurse and aide with [facility]

## 2020-07-13 NOTE — ED Triage Notes (Signed)
Via [facility] from home. Per report, yesterday pt had a fistula placed in right arm and PICC line in the left arm. Today, increased pain and decreased movement in the right arm near the fistuala site.  Dialysis T/TH/S. Feels like he is retaining fluid. Large hernia in the left groin area. 142 palpated, hr 76, sats 96% RA.

## 2020-07-14 ENCOUNTER — Encounter (HOSPITAL_COMMUNITY): Payer: Self-pay | Admitting: Vascular Surgery

## 2020-07-14 DIAGNOSIS — R5381 Other malaise: Secondary | ICD-10-CM | POA: Diagnosis not present

## 2020-07-14 DIAGNOSIS — K4090 Unilateral inguinal hernia, without obstruction or gangrene, not specified as recurrent: Secondary | ICD-10-CM | POA: Diagnosis not present

## 2020-07-14 DIAGNOSIS — M2550 Pain in unspecified joint: Secondary | ICD-10-CM | POA: Diagnosis not present

## 2020-07-14 DIAGNOSIS — Z7401 Bed confinement status: Secondary | ICD-10-CM | POA: Diagnosis not present

## 2020-07-14 DIAGNOSIS — I959 Hypotension, unspecified: Secondary | ICD-10-CM | POA: Diagnosis not present

## 2020-07-14 LAB — HEMOGLOBIN A1C
Hgb A1c MFr Bld: 5.1 % (ref 4.8–5.6)
Mean Plasma Glucose: 99.67 mg/dL

## 2020-07-14 LAB — CBG MONITORING, ED
Glucose-Capillary: 88 mg/dL (ref 70–99)
Glucose-Capillary: 141 mg/dL — ABNORMAL HIGH (ref 70–99)

## 2020-07-14 NOTE — Care Management (Signed)
Asked PA to write [place] orders, Home health secured Aide RN for patient via [facility] with [facility], called patients mobile phone left message regarding discharge plan

## 2020-07-14 NOTE — Evaluation (Signed)
Physical Therapy Evaluation Patient Details Name: [name]. MRN: [id number] DOB: Dec 12, 1945 Today's Date: 07/14/2020   History of Present Illness  75 yo male presentes to ED 07/13/20 with R inguinal hernia pain. 07/12/20 had AV fistula placed in his right upper arm and had a right tunneled catheter removed yesterday and had a left tunneled catheter placed for dialysis. On return home fell and feels more of the hernia has been out and it is more painful. Missed dialysis from home due to pain, Dialyzed in hospital. Pt with history of atrial fibrillation, COPD, end-stage renal disease on hemodialysis, chronic respiratory failure on 2 L of oxygen, uses w/c for mobilization.  Clinical Impression  PTA pt living alone in mobile home with ramped entry. Pt reports he is independent with transfer <>hover round which he uses in his home. Pt performs bathing and dressing, daughter provided transportation to HD, groceries and hot meals. Pt is limited in safe mobility by decreased UE ROM, generalized weakness, decreased balance and poor safety awareness. Pt is min guard for transfers and ambulation of 18 feet with RW. Pt is likely near baseline. PT recommending HHPT for improvement of strength and balance. PT will continue to follow acutely.     Follow Up Recommendations Home health PT;Supervision for mobility/OOB    Equipment Recommendations  None recommended by PT (has necessary equipment)       Precautions / Restrictions Precautions Precautions: Fall Precaution Comments: fall prior to coming to Ed Restrictions Weight Bearing Restrictions: No      Mobility  Bed Mobility               General bed mobility comments: sitting on EoB on entry    Transfers Overall transfer level: Needs assistance Equipment used: None Transfers: Sit to/from Stand Sit to Stand: Supervision         General transfer comment: supervision for safety  Ambulation/Gait Ambulation/Gait assistance: Min  guard Gait Distance (Feet): 18 Feet Assistive device: Rolling walker (2 wheeled) Gait Pattern/deviations: Step-through pattern;Decreased step length - right;Decreased step length - left;Decreased dorsiflexion - right;Decreased dorsiflexion - left;Shuffle Gait velocity: slowed Gait velocity interpretation: <1.31 ft/sec, indicative of household ambulator General Gait Details: min guard for safety, slow mildly unsteady gait, pt reports his legs are "getting ready to give out" as he gets back to bed         Balance Overall balance assessment: Needs assistance Sitting-balance support: Feet supported;No upper extremity supported Sitting balance-Leahy Scale: Good     Standing balance support: During functional activity;No upper extremity supported Standing balance-Leahy Scale: Fair Standing balance comment: able to perform static tasks, but would not accept perturbation                             Pertinent Vitals/Pain Pain Assessment: Faces Faces Pain Scale: Hurts a little bit Pain Location: hernia, with standing Pain Descriptors / Indicators: Grimacing;Guarding;Moaning Pain Intervention(s): Limited activity within patient's tolerance;Monitored during session;Repositioned    Home Living Family/patient expects to be discharged to:: Private residence Living Arrangements: Alone Available Help at Discharge: Family;Available PRN/intermittently Type of Home: House Home Access: Ramped entrance     Home Layout: One level Home Equipment: Cane - single point;Grab bars - tub/shower;Walker - 2 wheels;Shower seat;Walker - 4 wheels;Bedside commode;Cane - quad;Grab bars - toilet;Wheelchair - manual Additional Comments: hover round scooter; uses tub shower, sits on chair    Prior Function Level of Independence: Needs assistance   Gait /  Transfers Assistance Needed: uses hover round in home, w/c for community mobilization  ADL's / Homemaking Assistance Needed: does bathing and  dressing, daughter provides transportation to HD, and provides hot meals during the week,  Comments: reports difficulty standing due to hernia     Hand Dominance   Dominant Hand: Right    Extremity/Trunk Assessment   Upper Extremity Assessment Upper Extremity Assessment: RUE deficits/detail;LUE deficits/detail RUE Deficits / Details: torn rotator cuff limits shoulder flexion, elbow and hand strength 3/5 RUE Coordination: decreased fine motor;decreased gross motor LUE Deficits / Details: torn rotator cuff limits shoulder flexion, elbow and hand strength 3/5 LUE Coordination: decreased fine motor;decreased gross motor    Lower Extremity Assessment Lower Extremity Assessment: Generalized weakness (R>L)       Communication   Communication: HOH  Cognition Arousal/Alertness: Awake/alert Behavior During Therapy: WFL for tasks assessed/performed Overall Cognitive Status: Within Functional Limits for tasks assessed                                        General Comments General comments (skin integrity, edema, etc.): VSS on RA        Assessment/Plan    PT Assessment Patient needs continued PT services  PT Problem List Decreased strength;Decreased range of motion;Decreased activity tolerance;Decreased balance;Decreased mobility;Decreased coordination;Decreased cognition;Decreased safety awareness;Pain       PT Treatment Interventions DME instruction;Gait training;Functional mobility training;Therapeutic activities;Therapeutic exercise;Balance training;Cognitive remediation;Patient/family education    PT Goals (Current goals can be found in the Care Plan section)  Acute Rehab PT Goals Patient Stated Goal: get home to feed dogs PT Goal Formulation: With patient Time For Goal Achievement: 07/28/20 Potential to Achieve Goals: Fair    Frequency Min 3X/week   Barriers to discharge Decreased caregiver support         AM-PAC PT "6 Clicks" Mobility  Outcome  Measure Help needed turning from your back to your side while in a flat bed without using bedrails?: None Help needed moving from lying on your back to sitting on the side of a flat bed without using bedrails?: None Help needed moving to and from a bed to a chair (including a wheelchair)?: None Help needed standing up from a chair using your arms (e.g., wheelchair or bedside chair)?: A Little Help needed to walk in hospital room?: A Little Help needed climbing 3-5 steps with a railing? : Total 6 Click Score: 19    End of Session Equipment Utilized During Treatment: Gait belt Activity Tolerance: Patient tolerated treatment well Patient left: with call bell/phone within reach (sitting EoB eating breakfast)   PT Visit Diagnosis: Unsteadiness on feet (R26.81);Muscle weakness (generalized) (M62.81);Other abnormalities of gait and mobility (R26.89);History of falling (Z91.81);Pain Pain - part of body:  (hernia with standing)    Time: 9373-4287 PT Time Calculation (min) (ACUTE ONLY): 22 min   Charges:   PT Evaluation $PT Eval Moderate Complexity: 1 Mod          Jacquline Terrill B. Migdalia Dk PT, DPT Acute Rehabilitation Services Pager [phone] Office [phone]   [place] 07/14/2020, 7:59 AM

## 2020-07-14 NOTE — Progress Notes (Signed)
.. Transition of Care [facility]) - Emergency Department Mini Assessment   Patient Details  Name: [name]. MRN: [id number] Date of Birth: [birth year]  Transition of Care [facility]) CM/SW Contact:    Brock Mokry C Tarpley-Carter, LCSWA Phone Number: 07/14/2020, 8:38 AM   Clinical Narrative: [facility] CM/CSW consulted with pt.  Pt is in need of a [facility] nurse. Pts daughter/Brenda Fabio Neighbors comes to the home daily to bring meals and to assist him around the home.    Dmarco Baldus Tarpley-Carter, MSW, LCSW-A Pronouns:  She, Her, [place] ED Transitions of CareClinical Social Worker [email or web address] [phone]   ED Mini Assessment: What brought you to the Emergency Department? : Can't walk  Barriers to Discharge: No Barriers Identified     Means of departure: Car  Interventions which prevented an admission or readmission: [place] or Services    Patient Contact and Communications Key Contact 1: [name]   Spoke with: [name] Contact Date: 07/14/20,     Contact Phone Number: [phone] Call outcome: Pt needs [place] per daughter.  Patient states their goals for this hospitalization and ongoing recovery are:: Pt want to be mobile independently. CMS Medicare.gov Compare Post Acute Care list provided to:: Patient Choice offered to / list presented to : Patient  Admission diagnosis:  Back pain [M54.9] Patient Active Problem List   Diagnosis Date Noted  . Adult failure to thrive   . Palliative care by specialist   . Hyperkalemia 02/20/2020  . Anaphylactic shock, unspecified, initial encounter 01/19/2020  . Acute pulmonary edema ([place]) 10/10/2019  . Steal syndrome dialysis vascular access ([place]) 10/10/2019  . Hypotension 10/09/2019  . SOB (shortness of breath)   . Non-ST elevation (NSTEMI) myocardial infarction ([place])   . Volume overload 07/16/2019  . Reducible right inguinal hernia 07/16/2019  . Acute on chronic  respiratory failure with hypoxia ([place]) 07/16/2019  . Constipation 05/31/2019  . Plantar fasciitis, bilateral 03/15/2019  . Anaphylactic reaction due to adverse effect of correct drug or medicament properly administered, initial encounter 03/15/2019  . Atrial fibrillation ([place])   . Raised antibody titer 12/19/2018  . Anemia in chronic kidney disease 12/12/2018  . Normocytic anemia 11/23/2018  . Chronic viral hepatitis, unspecified ([place]) 11/19/2018  . Disorder of phosphorus metabolism, unspecified 11/19/2018  . Hemoptysis   . ILD (interstitial lung disease) ([place])   . GIB (gastrointestinal bleeding) 11/05/2018  . Symptomatic anemia 11/04/2018  . Iron deficiency anemia, unspecified 10/27/2018  . Secondary hyperparathyroidism of renal origin ([place]) 10/27/2018  . Unspecified protein-calorie malnutrition ([place]) 10/27/2018  . DVT (deep venous thrombosis) ([place]) 10/26/2018  . GERD (gastroesophageal reflux disease) 10/26/2018  . ESRD on dialysis ([place]) 10/26/2018  . Pancytopenia ([place]) 10/26/2018  . Elevated troponin 10/26/2018  . Hypomagnesemia 10/26/2018  . Coagulation defect, unspecified ([place]) 10/22/2018  . Hypothyroidism, unspecified 10/20/2018  . Pruritus, unspecified 10/20/2018  . Other specified arthritis, unspecified site 10/19/2018  . Unspecified hearing loss, right ear 10/19/2018  . Uremia 10/08/2018  . History of bladder cancer 10/06/2018  . Type II diabetes mellitus with renal manifestations ([place]) 10/06/2018  . CAD (coronary artery disease) 10/06/2018  . ATN (acute tubular necrosis) ([place]) 10/05/2018  . 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 Thoracic disc disease with myelopathy   . Allergic reaction 02/21/2018  . Perennial and seasonal allergic rhinitis 02/21/2018  . Angioedema 02/21/2018  . COPD (  chronic obstructive pulmonary disease) ([place]) 02/21/2018  . Bradycardia 01/27/2011  . Carotid artery stenosis 10/31/2009  . ERECTILE  DYSFUNCTION, ORGANIC 01/24/2009  . Hyperlipidemia 10/08/2008  . TOBACCO ABUSE 10/08/2008  . HYPERTENSION, BENIGN 10/08/2008  . CAD, NATIVE VESSEL 10/08/2008  . PVD 10/08/2008  . BRUIT 10/08/2008  . Essential hypertension 10/05/2008  . Chronic back pain 10/05/2008   PCP:  [name], MD Pharmacy:   [id number] - [place], [address] [place] [address] Phone: [phone] Fax: [phone]

## 2020-07-14 NOTE — ED Provider Notes (Signed)
Pt needed RN, homehealth aide and PT ordered so that he could go home.  He is able to ambulate here with a walker and daughter does check on him daily.   Blanchie Dessert, MD 07/14/20 [phone]

## 2020-07-15 DIAGNOSIS — J849 Interstitial pulmonary disease, unspecified: Secondary | ICD-10-CM | POA: Diagnosis not present

## 2020-07-15 DIAGNOSIS — J9621 Acute and chronic respiratory failure with hypoxia: Secondary | ICD-10-CM | POA: Diagnosis not present

## 2020-07-15 DIAGNOSIS — I2510 Atherosclerotic heart disease of native coronary artery without angina pectoris: Secondary | ICD-10-CM | POA: Diagnosis not present

## 2020-07-15 DIAGNOSIS — I509 Heart failure, unspecified: Secondary | ICD-10-CM | POA: Diagnosis not present

## 2020-07-15 DIAGNOSIS — J449 Chronic obstructive pulmonary disease, unspecified: Secondary | ICD-10-CM | POA: Diagnosis not present

## 2020-07-16 DIAGNOSIS — N2581 Secondary hyperparathyroidism of renal origin: Secondary | ICD-10-CM | POA: Diagnosis not present

## 2020-07-16 DIAGNOSIS — Z992 Dependence on renal dialysis: Secondary | ICD-10-CM | POA: Diagnosis not present

## 2020-07-16 DIAGNOSIS — N186 End stage renal disease: Secondary | ICD-10-CM | POA: Diagnosis not present

## 2020-07-18 ENCOUNTER — Telehealth: Payer: Self-pay

## 2020-07-18 DIAGNOSIS — N186 End stage renal disease: Secondary | ICD-10-CM | POA: Diagnosis not present

## 2020-07-18 DIAGNOSIS — N2581 Secondary hyperparathyroidism of renal origin: Secondary | ICD-10-CM | POA: Diagnosis not present

## 2020-07-18 DIAGNOSIS — Z992 Dependence on renal dialysis: Secondary | ICD-10-CM | POA: Diagnosis not present

## 2020-07-18 NOTE — Telephone Encounter (Signed)
Ms. [name] called  Because she has a few questions on Dr. Benson Norway (GI) ,most recent findings. Please advise of a call back or if she needs to schedule a consult appointment. [place]

## 2020-07-20 DIAGNOSIS — Z992 Dependence on renal dialysis: Secondary | ICD-10-CM | POA: Diagnosis not present

## 2020-07-20 DIAGNOSIS — N186 End stage renal disease: Secondary | ICD-10-CM | POA: Diagnosis not present

## 2020-07-20 DIAGNOSIS — N2581 Secondary hyperparathyroidism of renal origin: Secondary | ICD-10-CM | POA: Diagnosis not present

## 2020-07-23 ENCOUNTER — Emergency Department (HOSPITAL_COMMUNITY): Payer: Medicare HMO

## 2020-07-23 ENCOUNTER — Other Ambulatory Visit: Payer: Self-pay

## 2020-07-23 ENCOUNTER — Observation Stay (HOSPITAL_COMMUNITY)
Admission: EM | Admit: 2020-07-23 | Discharge: 2020-07-24 | Disposition: A | Discharge status: Home / Self Care | Payer: Medicare HMO | Attending: Internal Medicine | Admitting: Internal Medicine

## 2020-07-23 ENCOUNTER — Encounter (HOSPITAL_COMMUNITY): Payer: Self-pay | Admitting: *Deleted

## 2020-07-23 DIAGNOSIS — R7989 Other specified abnormal findings of blood chemistry: Secondary | ICD-10-CM

## 2020-07-23 DIAGNOSIS — Z515 Encounter for palliative care: Secondary | ICD-10-CM

## 2020-07-23 DIAGNOSIS — F17200 Nicotine dependence, unspecified, uncomplicated: Secondary | ICD-10-CM | POA: Diagnosis present

## 2020-07-23 DIAGNOSIS — Z7982 Long term (current) use of aspirin: Secondary | ICD-10-CM | POA: Diagnosis not present

## 2020-07-23 DIAGNOSIS — I480 Paroxysmal atrial fibrillation: Secondary | ICD-10-CM | POA: Diagnosis not present

## 2020-07-23 DIAGNOSIS — C7911 Secondary malignant neoplasm of bladder: Secondary | ICD-10-CM | POA: Insufficient documentation

## 2020-07-23 DIAGNOSIS — H5789 Other specified disorders of eye and adnexa: Secondary | ICD-10-CM | POA: Diagnosis not present

## 2020-07-23 DIAGNOSIS — E869 Volume depletion, unspecified: Secondary | ICD-10-CM | POA: Insufficient documentation

## 2020-07-23 DIAGNOSIS — F17210 Nicotine dependence, cigarettes, uncomplicated: Secondary | ICD-10-CM | POA: Insufficient documentation

## 2020-07-23 DIAGNOSIS — E782 Mixed hyperlipidemia: Secondary | ICD-10-CM

## 2020-07-23 DIAGNOSIS — E785 Hyperlipidemia, unspecified: Secondary | ICD-10-CM | POA: Diagnosis present

## 2020-07-23 DIAGNOSIS — I5033 Acute on chronic diastolic (congestive) heart failure: Secondary | ICD-10-CM | POA: Diagnosis present

## 2020-07-23 DIAGNOSIS — W19XXXA Unspecified fall, initial encounter: Secondary | ICD-10-CM | POA: Insufficient documentation

## 2020-07-23 DIAGNOSIS — Z992 Dependence on renal dialysis: Secondary | ICD-10-CM | POA: Insufficient documentation

## 2020-07-23 DIAGNOSIS — I132 Hypertensive heart and chronic kidney disease with heart failure and with stage 5 chronic kidney disease, or end stage renal disease: Secondary | ICD-10-CM | POA: Diagnosis not present

## 2020-07-23 DIAGNOSIS — K7291 Hepatic failure, unspecified with coma: Secondary | ICD-10-CM | POA: Diagnosis not present

## 2020-07-23 DIAGNOSIS — Z20822 Contact with and (suspected) exposure to covid-19: Secondary | ICD-10-CM | POA: Insufficient documentation

## 2020-07-23 DIAGNOSIS — I959 Hypotension, unspecified: Secondary | ICD-10-CM | POA: Diagnosis not present

## 2020-07-23 DIAGNOSIS — S069X9A Unspecified intracranial injury with loss of consciousness of unspecified duration, initial encounter: Secondary | ICD-10-CM | POA: Diagnosis not present

## 2020-07-23 DIAGNOSIS — K219 Gastro-esophageal reflux disease without esophagitis: Secondary | ICD-10-CM | POA: Diagnosis present

## 2020-07-23 DIAGNOSIS — J811 Chronic pulmonary edema: Secondary | ICD-10-CM | POA: Diagnosis not present

## 2020-07-23 DIAGNOSIS — E875 Hyperkalemia: Secondary | ICD-10-CM | POA: Insufficient documentation

## 2020-07-23 DIAGNOSIS — E038 Other specified hypothyroidism: Secondary | ICD-10-CM | POA: Diagnosis not present

## 2020-07-23 DIAGNOSIS — R627 Adult failure to thrive: Secondary | ICD-10-CM | POA: Diagnosis not present

## 2020-07-23 DIAGNOSIS — E8770 Fluid overload, unspecified: Secondary | ICD-10-CM | POA: Diagnosis not present

## 2020-07-23 DIAGNOSIS — R55 Syncope and collapse: Principal | ICD-10-CM | POA: Diagnosis present

## 2020-07-23 DIAGNOSIS — R9431 Abnormal electrocardiogram [ECG] [EKG]: Secondary | ICD-10-CM

## 2020-07-23 DIAGNOSIS — R58 Hemorrhage, not elsewhere classified: Secondary | ICD-10-CM | POA: Diagnosis not present

## 2020-07-23 DIAGNOSIS — I2510 Atherosclerotic heart disease of native coronary artery without angina pectoris: Secondary | ICD-10-CM | POA: Insufficient documentation

## 2020-07-23 DIAGNOSIS — R945 Abnormal results of liver function studies: Secondary | ICD-10-CM

## 2020-07-23 DIAGNOSIS — N186 End stage renal disease: Secondary | ICD-10-CM

## 2020-07-23 DIAGNOSIS — D72829 Elevated white blood cell count, unspecified: Secondary | ICD-10-CM | POA: Diagnosis not present

## 2020-07-23 DIAGNOSIS — R9082 White matter disease, unspecified: Secondary | ICD-10-CM | POA: Diagnosis not present

## 2020-07-23 DIAGNOSIS — R0602 Shortness of breath: Secondary | ICD-10-CM | POA: Diagnosis not present

## 2020-07-23 DIAGNOSIS — E1122 Type 2 diabetes mellitus with diabetic chronic kidney disease: Secondary | ICD-10-CM | POA: Diagnosis not present

## 2020-07-23 DIAGNOSIS — E872 Acidosis: Secondary | ICD-10-CM | POA: Diagnosis not present

## 2020-07-23 DIAGNOSIS — R52 Pain, unspecified: Secondary | ICD-10-CM | POA: Diagnosis not present

## 2020-07-23 DIAGNOSIS — I469 Cardiac arrest, cause unspecified: Secondary | ICD-10-CM | POA: Insufficient documentation

## 2020-07-23 DIAGNOSIS — J449 Chronic obstructive pulmonary disease, unspecified: Secondary | ICD-10-CM | POA: Insufficient documentation

## 2020-07-23 DIAGNOSIS — I4891 Unspecified atrial fibrillation: Secondary | ICD-10-CM | POA: Diagnosis present

## 2020-07-23 DIAGNOSIS — I120 Hypertensive chronic kidney disease with stage 5 chronic kidney disease or end stage renal disease: Secondary | ICD-10-CM | POA: Diagnosis not present

## 2020-07-23 DIAGNOSIS — R531 Weakness: Secondary | ICD-10-CM | POA: Diagnosis not present

## 2020-07-23 LAB — CBC
HCT: 28.7 % — ABNORMAL LOW (ref 39.0–52.0)
Hemoglobin: 8.9 g/dL — ABNORMAL LOW (ref 13.0–17.0)
MCH: 27.5 pg (ref 26.0–34.0)
MCHC: 31 g/dL (ref 30.0–36.0)
MCV: 88.6 fL (ref 80.0–100.0)
Platelets: UNDETERMINED 10*3/uL (ref 150–400)
RBC: 3.24 MIL/uL — ABNORMAL LOW (ref 4.22–5.81)
RDW: 22.2 % — ABNORMAL HIGH (ref 11.5–15.5)
WBC: 14.2 10*3/uL — ABNORMAL HIGH (ref 4.0–10.5)
nRBC: 0.1 % (ref 0.0–0.2)

## 2020-07-23 LAB — COMPREHENSIVE METABOLIC PANEL
ALT: 152 U/L — ABNORMAL HIGH (ref 0–44)
AST: 208 U/L — ABNORMAL HIGH (ref 15–41)
Albumin: 2.3 g/dL — ABNORMAL LOW (ref 3.5–5.0)
Alkaline Phosphatase: 404 U/L — ABNORMAL HIGH (ref 38–126)
Anion gap: 25 — ABNORMAL HIGH (ref 5–15)
BUN: 104 mg/dL — ABNORMAL HIGH (ref 8–23)
CO2: 15 mmol/L — ABNORMAL LOW (ref 22–32)
Calcium: 7.8 mg/dL — ABNORMAL LOW (ref 8.9–10.3)
Chloride: 98 mmol/L (ref 98–111)
Creatinine, Ser: 9.66 mg/dL — ABNORMAL HIGH (ref 0.61–1.24)
GFR, Estimated: 5 mL/min — ABNORMAL LOW (ref >60)
Glucose, Bld: 76 mg/dL (ref 70–99)
Potassium: 6.8 mmol/L (ref 3.5–5.1)
Sodium: 138 mmol/L (ref 135–145)
Total Bilirubin: 2.9 mg/dL — ABNORMAL HIGH (ref 0.3–1.2)
Total Protein: 5.9 g/dL — ABNORMAL LOW (ref 6.5–8.1)

## 2020-07-23 LAB — SARS CORONAVIRUS 2 BY RT PCR (HOSPITAL ORDER, PERFORMED IN ~~LOC~~ HOSPITAL LAB): SARS Coronavirus 2: NEGATIVE

## 2020-07-23 LAB — APTT: aPTT: 73 seconds — ABNORMAL HIGH (ref 24–36)

## 2020-07-23 LAB — CBG MONITORING, ED: Glucose-Capillary: 123 mg/dL — ABNORMAL HIGH (ref 70–99)

## 2020-07-23 LAB — PROTIME-INR
INR: 1.3 — ABNORMAL HIGH (ref 0.8–1.2)
Prothrombin Time: 15.6 seconds — ABNORMAL HIGH (ref 11.4–15.2)

## 2020-07-23 LAB — TROPONIN I (HIGH SENSITIVITY): Troponin I (High Sensitivity): 1211 ng/L (ref <18)

## 2020-07-23 MED ORDER — HEPARIN SODIUM (PORCINE) 1000 UNIT/ML IJ SOLN
2000.0000 [IU] | Freq: Once | INTRAMUSCULAR | Status: AC
  Administered 2020-07-23: 2000 [IU] via INTRAVENOUS

## 2020-07-23 MED ORDER — ATORVASTATIN CALCIUM 80 MG PO TABS
80.0000 mg | ORAL_TABLET | Freq: Every day | ORAL | Status: DC
  Administered 2020-07-24: 80 mg via ORAL
  Filled 2020-07-23: qty 1

## 2020-07-23 MED ORDER — HYDROMORPHONE HCL 1 MG/ML IJ SOLN
1.0000 mg | Freq: Once | INTRAMUSCULAR | Status: AC
  Administered 2020-07-23: 1 mg via INTRAMUSCULAR
  Filled 2020-07-23: qty 1

## 2020-07-23 MED ORDER — ACETAMINOPHEN 325 MG PO TABS
650.0000 mg | ORAL_TABLET | Freq: Four times a day (QID) | ORAL | Status: DC | PRN
  Administered 2020-07-24 (×2): 650 mg via ORAL
  Filled 2020-07-23 (×2): qty 2

## 2020-07-23 MED ORDER — PANTOPRAZOLE SODIUM 40 MG PO TBEC
40.0000 mg | DELAYED_RELEASE_TABLET | Freq: Every day | ORAL | Status: DC
  Administered 2020-07-24 (×2): 40 mg via ORAL
  Filled 2020-07-23 (×2): qty 1

## 2020-07-23 MED ORDER — EZETIMIBE 10 MG PO TABS
10.0000 mg | ORAL_TABLET | Freq: Every day | ORAL | Status: DC
  Administered 2020-07-24: 10 mg via ORAL
  Filled 2020-07-23: qty 1

## 2020-07-23 MED ORDER — NICOTINE 21 MG/24HR TD PT24
21.0000 mg | MEDICATED_PATCH | Freq: Every day | TRANSDERMAL | Status: DC | PRN
  Administered 2020-07-24: 21 mg via TRANSDERMAL
  Filled 2020-07-23: qty 1

## 2020-07-23 MED ORDER — LIDOCAINE 5 % EX PTCH
1.0000 | MEDICATED_PATCH | Freq: Every day | CUTANEOUS | Status: DC | PRN
  Administered 2020-07-24: 1 via TRANSDERMAL
  Filled 2020-07-23: qty 1

## 2020-07-23 MED ORDER — CHLORHEXIDINE GLUCONATE CLOTH 2 % EX PADS
6.0000 | MEDICATED_PAD | Freq: Every day | CUTANEOUS | Status: DC
  Administered 2020-07-24: 6 via TOPICAL

## 2020-07-23 MED ORDER — DOCUSATE SODIUM 100 MG PO CAPS
100.0000 mg | ORAL_CAPSULE | Freq: Every day | ORAL | Status: DC
  Administered 2020-07-24: 100 mg via ORAL
  Filled 2020-07-23: qty 1

## 2020-07-23 MED ORDER — AMIODARONE HCL 200 MG PO TABS
200.0000 mg | ORAL_TABLET | Freq: Every day | ORAL | Status: DC
  Administered 2020-07-24: 200 mg via ORAL
  Filled 2020-07-23: qty 1

## 2020-07-23 MED ORDER — HEPARIN SODIUM (PORCINE) 1000 UNIT/ML IJ SOLN
INTRAMUSCULAR | Status: AC
  Administered 2020-07-23: 1000 [IU]
  Filled 2020-07-23: qty 6

## 2020-07-23 MED ORDER — SEVELAMER CARBONATE 800 MG PO TABS
1600.0000 mg | ORAL_TABLET | Freq: Three times a day (TID) | ORAL | Status: DC
  Administered 2020-07-24: 1600 mg via ORAL
  Filled 2020-07-23 (×2): qty 2

## 2020-07-23 MED ORDER — FERROUS SULFATE 325 (65 FE) MG PO TABS
325.0000 mg | ORAL_TABLET | Freq: Every day | ORAL | Status: DC
  Administered 2020-07-24: 325 mg via ORAL
  Filled 2020-07-23: qty 1

## 2020-07-23 MED ORDER — ACETAMINOPHEN 650 MG RE SUPP: 650.0000 mg | Freq: Four times a day (QID) | RECTAL | Status: DC | PRN

## 2020-07-23 NOTE — ED Provider Notes (Signed)
3:27 PM Care assumed from Dr. Maryan Rued.  At time of transfer of care, patient is awaiting for laboratory tests prior to admission for worsening fluid overload in the setting of not completing dialysis for the last several sessions as well as syncope today.  Nephrology was already contacted and is going to place orders for him for dialysis however when his labs were completed, patient will need admission.  They will follow during admission.  Otherwise, patient had CT head due to the fall and head injury with there is no evidence of acute injury.  There is some age-indeterminate changes however low suspicion for stroke at this time.   Anticipate admission after labs are completed.   6:16 PM Was just informed by nursing that the lab claims they did not get the blood to run the metabolic panel.  It was reportedly sent with the blood that was run with the CBC.  Regardless of why, we will get more blood and send it down for the metabolic panel before his admission.  Patient is Covid negative.  Will try to either get a dialysis nurse to come down to get blood from his port which is what they did earlier versus do a straight stick and get blood without IV placement.  Anticipate admission after work-up is completed.  6:36 PM Nephrology nursing just called our emergency nursing team and reports that they are getting ready to have the patient come for dialysis.  They report that rather than have [place] continue sticking him and messing with the dialysis access to get blood work, they will get the blood work done upstairs.  Plan was to admit the patient so we will call for admission for the syncope, fluid overload, and hypotension.  He will go to dialysis and be admitted after.   [name], Gwenyth Allegra, MD 07/26/2020 [phone]
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
## 2020-07-23 NOTE — ED Triage Notes (Signed)
Pt fell in bathroom this am about 5:30.  Pt is to have HD today.  Pt has skin tears to bilateral upper extremities.  Pt had positive LOC, right head lac, right eye bloody, small puncture site behind right ear.  Pt is alert and complaining of back pain, right groin pain.  Pt is not on blood thinners, pat states so weak his legs gave out.  Pt lives alone.  Pt has HD catheter to left upper chest and has a stent in right chest for plan for something else for HD access.  Pt falls a lot. BP done in right calf is 68/48, patient has palpable radial pulses and alert.

## 2020-07-23 NOTE — ED Notes (Signed)
Dr Lelon Frohlich is aware of pt's bp, bp may not be accurate, because it's on pt's leg due to pt having bilat arm restrictions.

## 2020-07-23 NOTE — H&P (Signed)
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 report of recent, progressive shortness of breath associated with orthopnea and progression of edema in the bilateral lower extremities.  This appears to be the basis of acute volume overload, with suspected contribution from incomplete recent hemodialysis sessions, as further described above.  Plan: In the setting of not producing any urine at baseline, nephrology has been consulted, with plan for hemodialysis session today.  Monitor strict  I's and O's and daily weights.  Repeat CMP in the morning.  Check serum magnesium level.  Monitor on telemetry.  Monitor continuous pulse oximetry.        #) Leukocytosis: Presenting CBC reflects mildly elevated white blood cell count of 14,000.  Suspect that this is inflammatory in nature in the setting of episode of syncope during which it appears that the patient hit his head on the bathroom floor, as opposed to representing underlying infectious  process.  Of note, the patient does not produce urine in the setting of his end-stage renal disease.  Chest x-ray, shows no acute cardiopulmonary process relative to most recent prior plain film of the chest performed on 07/12/2020.  Additionally, screening COVID-19 testing performed in the ED today was found to be negative.  Plan: will check procalcitonin.  Repeat CBC with differential in the morning.      #) Paroxysmal atrial fibrillation: Documented history of such. In the setting of a CHA2DS2-VASc score of 6, there is an indication for the patient to be on chronic anticoagulation for thromboembolic prophylaxis.  While the patient was previously anticoagulated in the setting, he reports that this was recently discontinued due to concern for bleeding risk in the setting of recurrent falls.  Rhythm control strategy was pursued as an outpatient, the patient on daily amiodarone.  Otherwise, no AV nodal blocking agents at home.  Appears to be in normal sinus rhythm at the time of this evening's presentation.   Plan: monitor strict I's & O's and daily weights.  Follow for result of this evening CMP and repeat metabolic panel in the morning. Check serum magnesium level. Continue home amiodarone.  Monitor on telemetry in the setting of acute on chronic diastolic heart failure in the setting of acute volume overload, as further described above.     #) Hyperlipidemia: On atorvastatin and Ezetimibe as outpatient.   Plan: Continue home atorvastatin and Ezetimibe.     #) GERD: On Protonix as an outpatient.  Plan: Continue home PPI      #) Chronic tobacco abuse: The patient acknowledges that his current smoker, having smoked approximately 2 packs/day for at least the last 50 years.  Plan: Counseled the patient on the importance of complete smoking discontinuation, particularly in the setting of a history of coronary disease and peripheral vascular disease.  Have ordered a as needed nicotine  patch for use during this hospitalization.     DVT prophylaxis: scd's  Code Status: DNR/DNI confirmed per my discussions with the patient this evening. Family Communication: none Disposition Plan: Per Rounding Team Consults called: On-call nephrology formally consulted, as above Admission status: Observation; med telemetry    Of note, this patient was added by me to the following Admit List/Treatment Team:  mcadmits     PLEASE NOTE THAT DRAGON DICTATION SOFTWARE WAS USED IN THE CONSTRUCTION OF THIS NOTE.   [facility] Pager [phone] From 12PM- 8PM  Otherwise, please contact night-coverage  [email or web address] Password [facility]  07/18/2020, 6:49 PM

## 2020-07-23 NOTE — Progress Notes (Signed)
Received critical lab value on pt . Troponin 1211. Notifed ED RN Marquita Palms on pt's results. He will pass info onto ED phyician.
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
## 2020-07-23 NOTE — ED Notes (Signed)
Pt has scattered purple ecchymosis over entire back, on left shoulder, hands bilat right arm and scattered areas on chest.

## 2020-07-23 NOTE — ED Notes (Signed)
Attempted report x1. 

## 2020-07-23 NOTE — ED Notes (Signed)
Got patient undress on the monitor did ekg shown to Dr Maryan Rued patient is resting with call bell in reach

## 2020-07-23 NOTE — ED Notes (Signed)
Pt has 2+ left radial pulse, cap refill less than 3 sec, sensation intact. Warm to touch

## 2020-07-24 ENCOUNTER — Telehealth: Payer: Self-pay | Admitting: Family Medicine

## 2020-07-24 DIAGNOSIS — I5033 Acute on chronic diastolic (congestive) heart failure: Secondary | ICD-10-CM | POA: Diagnosis not present

## 2020-07-24 DIAGNOSIS — N186 End stage renal disease: Secondary | ICD-10-CM | POA: Diagnosis not present

## 2020-07-24 DIAGNOSIS — E8770 Fluid overload, unspecified: Secondary | ICD-10-CM

## 2020-07-24 DIAGNOSIS — I480 Paroxysmal atrial fibrillation: Secondary | ICD-10-CM | POA: Diagnosis not present

## 2020-07-24 DIAGNOSIS — I120 Hypertensive chronic kidney disease with stage 5 chronic kidney disease or end stage renal disease: Secondary | ICD-10-CM | POA: Diagnosis not present

## 2020-07-24 DIAGNOSIS — R0602 Shortness of breath: Secondary | ICD-10-CM | POA: Diagnosis not present

## 2020-07-24 DIAGNOSIS — F17200 Nicotine dependence, unspecified, uncomplicated: Secondary | ICD-10-CM | POA: Diagnosis not present

## 2020-07-24 DIAGNOSIS — Z992 Dependence on renal dialysis: Secondary | ICD-10-CM

## 2020-07-24 DIAGNOSIS — R627 Adult failure to thrive: Secondary | ICD-10-CM | POA: Diagnosis not present

## 2020-07-24 DIAGNOSIS — K219 Gastro-esophageal reflux disease without esophagitis: Secondary | ICD-10-CM | POA: Diagnosis not present

## 2020-07-24 DIAGNOSIS — Z20822 Contact with and (suspected) exposure to covid-19: Secondary | ICD-10-CM | POA: Diagnosis not present

## 2020-07-24 DIAGNOSIS — I132 Hypertensive heart and chronic kidney disease with heart failure and with stage 5 chronic kidney disease, or end stage renal disease: Secondary | ICD-10-CM | POA: Diagnosis not present

## 2020-07-24 DIAGNOSIS — R945 Abnormal results of liver function studies: Secondary | ICD-10-CM

## 2020-07-24 DIAGNOSIS — R55 Syncope and collapse: Secondary | ICD-10-CM | POA: Diagnosis not present

## 2020-07-24 DIAGNOSIS — D72829 Elevated white blood cell count, unspecified: Secondary | ICD-10-CM

## 2020-07-24 DIAGNOSIS — R7989 Other specified abnormal findings of blood chemistry: Secondary | ICD-10-CM

## 2020-07-24 DIAGNOSIS — R778 Other specified abnormalities of plasma proteins: Secondary | ICD-10-CM

## 2020-07-24 DIAGNOSIS — E1122 Type 2 diabetes mellitus with diabetic chronic kidney disease: Secondary | ICD-10-CM | POA: Diagnosis not present

## 2020-07-24 DIAGNOSIS — F17210 Nicotine dependence, cigarettes, uncomplicated: Secondary | ICD-10-CM | POA: Diagnosis not present

## 2020-07-24 DIAGNOSIS — R9431 Abnormal electrocardiogram [ECG] [EKG]: Secondary | ICD-10-CM

## 2020-07-24 DIAGNOSIS — Z515 Encounter for palliative care: Secondary | ICD-10-CM

## 2020-07-24 DIAGNOSIS — Z7982 Long term (current) use of aspirin: Secondary | ICD-10-CM | POA: Diagnosis not present

## 2020-07-24 LAB — COMPREHENSIVE METABOLIC PANEL
ALT: 731 U/L — ABNORMAL HIGH (ref 0–44)
AST: 1772 U/L — ABNORMAL HIGH (ref 15–41)
Albumin: 2.2 g/dL — ABNORMAL LOW (ref 3.5–5.0)
Alkaline Phosphatase: 343 U/L — ABNORMAL HIGH (ref 38–126)
Anion gap: 27 — ABNORMAL HIGH (ref 5–15)
BUN: 64 mg/dL — ABNORMAL HIGH (ref 8–23)
CO2: 14 mmol/L — ABNORMAL LOW (ref 22–32)
Calcium: 7.9 mg/dL — ABNORMAL LOW (ref 8.9–10.3)
Chloride: 101 mmol/L (ref 98–111)
Creatinine, Ser: 6.8 mg/dL — ABNORMAL HIGH (ref 0.61–1.24)
GFR, Estimated: 8 mL/min — ABNORMAL LOW (ref >60)
Glucose, Bld: 49 mg/dL — ABNORMAL LOW (ref 70–99)
Potassium: 6.4 mmol/L (ref 3.5–5.1)
Sodium: 142 mmol/L (ref 135–145)
Total Bilirubin: 3.3 mg/dL — ABNORMAL HIGH (ref 0.3–1.2)
Total Protein: 5.5 g/dL — ABNORMAL LOW (ref 6.5–8.1)

## 2020-07-24 LAB — CBC WITH DIFFERENTIAL/PLATELET
Abs Immature Granulocytes: 1.14 10*3/uL — ABNORMAL HIGH (ref 0.00–0.07)
Basophils Absolute: 0.1 10*3/uL (ref 0.0–0.1)
Basophils Relative: 0 %
Eosinophils Absolute: 4 10*3/uL — ABNORMAL HIGH (ref 0.0–0.5)
Eosinophils Relative: 20 %
HCT: 29.9 % — ABNORMAL LOW (ref 39.0–52.0)
Hemoglobin: 9.1 g/dL — ABNORMAL LOW (ref 13.0–17.0)
Immature Granulocytes: 6 %
Lymphocytes Relative: 2 %
Lymphs Abs: 0.5 10*3/uL — ABNORMAL LOW (ref 0.7–4.0)
MCH: 27.3 pg (ref 26.0–34.0)
MCHC: 30.4 g/dL (ref 30.0–36.0)
MCV: 89.8 fL (ref 80.0–100.0)
Monocytes Absolute: 0.6 10*3/uL (ref 0.1–1.0)
Monocytes Relative: 3 %
Neutro Abs: 14.2 10*3/uL — ABNORMAL HIGH (ref 1.7–7.7)
Neutrophils Relative %: 69 %
Platelets: UNDETERMINED 10*3/uL (ref 150–400)
RBC: 3.33 MIL/uL — ABNORMAL LOW (ref 4.22–5.81)
RDW: 22.6 % — ABNORMAL HIGH (ref 11.5–15.5)
WBC: 20.5 10*3/uL — ABNORMAL HIGH (ref 4.0–10.5)
nRBC: 1 % — ABNORMAL HIGH (ref 0.0–0.2)

## 2020-07-24 LAB — PROCALCITONIN: Procalcitonin: 16.4 ng/mL

## 2020-07-24 LAB — PHOSPHORUS: Phosphorus: 12 mg/dL — ABNORMAL HIGH (ref 2.5–4.6)

## 2020-07-24 LAB — MAGNESIUM: Magnesium: 2.4 mg/dL (ref 1.7–2.4)

## 2020-07-24 MED ORDER — HYDROMORPHONE HCL 2 MG PO TABS
1.0000 mg | ORAL_TABLET | ORAL | Status: DC | PRN
  Administered 2020-07-24: 1 mg via ORAL
  Filled 2020-07-24: qty 1

## 2020-07-24 MED ORDER — NITROGLYCERIN 0.4 MG SL SUBL
0.4000 mg | SUBLINGUAL_TABLET | SUBLINGUAL | Status: DC | PRN
  Filled 2020-07-24: qty 1

## 2020-07-24 MED ORDER — IPRATROPIUM-ALBUTEROL 0.5-2.5 (3) MG/3ML IN SOLN
3.0000 mL | RESPIRATORY_TRACT | Status: DC | PRN
  Administered 2020-07-24 (×2): 3 mL via RESPIRATORY_TRACT
  Filled 2020-07-24 (×2): qty 3

## 2020-07-24 MED ORDER — MIDODRINE HCL 5 MG PO TABS
10.0000 mg | ORAL_TABLET | Freq: Three times a day (TID) | ORAL | Status: DC
  Administered 2020-07-24 (×2): 10 mg via ORAL
  Filled 2020-07-24: qty 2

## 2020-07-24 MED ORDER — OXYCODONE HCL 5 MG PO TABS
5.0000 mg | ORAL_TABLET | Freq: Four times a day (QID) | ORAL | Status: DC | PRN
  Administered 2020-07-24: 5 mg via ORAL
  Filled 2020-07-24: qty 1

## 2020-07-24 MED ORDER — BENZONATATE 100 MG PO CAPS: 100.0000 mg | ORAL_CAPSULE | Freq: Three times a day (TID) | ORAL | Status: DC | PRN

## 2020-07-24 MED ORDER — CHLORHEXIDINE GLUCONATE CLOTH 2 % EX PADS: 6.0000 | MEDICATED_PAD | Freq: Every day | CUTANEOUS | Status: DC

## 2020-07-24 MED ORDER — ASPIRIN 81 MG PO CHEW
324.0000 mg | CHEWABLE_TABLET | Freq: Once | ORAL | Status: DC
  Filled 2020-07-24: qty 4

## 2020-07-24 MED ORDER — HYDROMORPHONE HCL 1 MG/ML IJ SOLN
1.0000 mg | INTRAMUSCULAR | Status: DC | PRN
  Administered 2020-07-24: 1 mg via INTRAVENOUS
  Filled 2020-07-24: qty 1

## 2020-07-24 MED ORDER — SODIUM ZIRCONIUM CYCLOSILICATE 10 G PO PACK
10.0000 g | PACK | Freq: Two times a day (BID) | ORAL | Status: DC
  Filled 2020-07-24: qty 1

## 2020-07-24 MED ORDER — IPRATROPIUM-ALBUTEROL 0.5-2.5 (3) MG/3ML IN SOLN
3.0000 mL | Freq: Once | RESPIRATORY_TRACT | Status: DC
  Filled 2020-07-24: qty 3

## 2020-07-24 MED ORDER — SODIUM CHLORIDE 0.9 % BOLUS PEDS
1000.0000 mL | Freq: Once | INTRAVENOUS | Status: AC
  Administered 2020-07-24: 1000 mL via INTRAVENOUS

## 2020-07-26 LAB — CALCIUM, IONIZED: Calcium, Ionized, Serum: 3.7 mg/dL — ABNORMAL LOW (ref 4.5–5.6)

## 2020-07-30 NOTE — Telephone Encounter (Signed)
MD to be made aware.  

## 2020-07-30 NOTE — Progress Notes (Signed)
Paged provider regarding potassium level 6.4 novisible hemolysis

## 2020-07-30 NOTE — Progress Notes (Signed)
The patient had soft blood pressures during dialysis and had only 1.5 L removed.  He got hypotensive earlier this morning with a blood pressure of 70/40.  He was seen and examined.  He was ordered 750 mL bolus of IV normal saline with improvement of his blood pressure to 102/30 and later 100/44.  He later became mildly restless with mild dyspnea and wheezing with mild cough and inability to expectorate.  He was ordered duo nebs and Mucinex with improvement of his breathing.  His latest pulse oximetry was 96-97 on 4 L O2 by nasal cannula.  We will continue to closely monitor him.

## 2020-07-30 NOTE — Progress Notes (Signed)
New Admission Note:  Arrival Method: Stretcher from ED Mental Orientation:  A&Ox4 Telemetry: Box # 18 Assessment: Completed Skin: skin tears to R posterior and L elbow, generalized bruising and bleeding abraisions IV: None Pain: 3/10 Tubes: None Safety Measures: Safety Fall Prevention Plan was discussed  Admission: Initiated Belongings: Clothing in bag at bedside Unit Orientation: Patient has been oriented to the room, unit, and the staff.  Orders have been reviewed and implemented. Call light has been placed within reach and bed alarm has been activated. Will continue to monitor the patient.  Percell Boston, RN

## 2020-07-30 NOTE — ED Notes (Signed)
This RN received pt from dialysis. Pt was brought back down with a restricted extremity bracelet on both extremities. Pt was unable to tell me if one or both arms were suppose to be restricted, called dialysis and was told to leave BP cuff on left arm.  Pt BP cuff was saturated with blood when arrived from dialysis, it was placed over a skin tear located on the left arm. BP cuff was replaced with a new one and moved to pts forearm.

## 2020-07-30 NOTE — Progress Notes (Signed)
DECEASED NOTE Incident:  Assessment: On assessment patient had no respirations or heart sounds. Assessed patient pulse, which resulted in being pulseless. Extremities were cold to touch and mottled.  Confirmation: Gwenette Greet RN, Arta Silence RN, Shellia Cleverly DO confirmed these findings as well.  Time of Death: [place]: notified Family: notified Morgue: patient taken down [address]: paged no response Patient Placement: Notified Comments: Staff present at time of death, Hulen Shouts, Arta Silence RN, and [employer] DO.  Berneta Levins, RN

## 2020-07-30 NOTE — Progress Notes (Addendum)
[facility] Progress Note   Subjective:   Pt seen in room. Blood noted on his pillow. Per NT, his lacerations have been bleeding off and on. Pt had 1.5L UF with HD yesterday but then had a 750 cc NS bolus due to hypotension. He does report SOB is resolved. States he is "miserable," and in pain "all over." States he wants to keep doing dialysis for fluid removal for now but wants to transition to hospice soon. States his daughter is coming in today to discuss this. Expressed interest in palliative care consult.   Objective Vitals:   2020/07/28 0341 2020/07/28 0441 28-Jul-2020 0542 07/28/2020 1000  BP: (!) 110/35 (!) 100/44 (!) 96/38   Pulse: 72 70 67   Resp:  (!) 26 (!) 24   Temp:  98 F (36.7 C) (!) 97.5 F (36.4 C) (!) 97.5 F (36.4 C)  TempSrc:  Oral Oral   SpO2: 100% 97% 96%    Physical Exam General: Chronically ill appearing male, alert, multiple lacerations on head/body Heart: RRR, no murmurs, rubs or gallops  Lungs: On O2 4L via [place], respirations unlabored, no wheezing, rhonchi or rales auscultated Abdomen: Non-distended, +BS Extremities: No edema b/l lower extremities Dialysis Access: RUA AVG +bruit, L IJ [facility]  Additional Objective Labs: Basic Metabolic Panel: Recent Labs  Lab 07/13/2020 1927 28-Jul-2020 0608  NA 138 142  K 6.8* 6.4*  CL 98 101  CO2 15* 14*  GLUCOSE 76 49*  BUN 104* 64*  CREATININE 9.66* 6.80*  CALCIUM 7.8* 7.9*  PHOS  --  12.0*   Liver Function Tests: Recent Labs  Lab 07/20/2020 1927 07/28/2020 0608  AST 208* 1,772*  ALT 152* 731*  ALKPHOS 404* 343*  BILITOT 2.9* 3.3*  PROT 5.9* 5.5*  ALBUMIN 2.3* 2.2*   CBC: Recent Labs  Lab 07/09/2020 1400 28-Jul-2020 0608  WBC 14.2* 20.5*  NEUTROABS  --  14.2*  HGB 8.9* 9.1*  HCT 28.7* 29.9*  MCV 88.6 89.8  PLT PLATELET CLUMPS NOTED ON SMEAR, UNABLE TO ESTIMATE PLATELET CLUMPS NOTED ON SMEAR, UNABLE TO ESTIMATE   Blood Culture    Component Value Date/Time   SDES URINE, RANDOM 11/05/2018 0602    SPECREQUEST NONE 11/05/2018 0602   CULT  11/05/2018 0602    NO GROWTH Performed at [facility], [place] [address], [place], [place] [address]    REPTSTATUS 11/06/2018 FINAL 11/05/2018 0602    CBG: Recent Labs  Lab 07/09/2020 1258  GLUCAP 123*   Studies/Results: CT HEAD WO CONTRAST  Result Date: 07/08/2020 CLINICAL DATA:  Fall.  Loss of consciousness. EXAM: CT HEAD WITHOUT CONTRAST TECHNIQUE: Contiguous axial images were obtained from the base of the skull through the vertex without intravenous contrast. COMPARISON:  No comparison studies available. FINDINGS: Brain: There is no evidence for acute hemorrhage, hydrocephalus, mass lesion, or abnormal extra-axial fluid collection. No definite CT evidence for acute infarction. Diffuse loss of parenchymal volume is consistent with atrophy. Patchy low attenuation in the deep hemispheric and periventricular white matter is nonspecific, but likely reflects chronic microvascular ischemic demyelination. Age indeterminate lacunar infarct noted right basal ganglia. Vascular: No hyperdense vessel or unexpected calcification. Skull: No evidence for fracture. No worrisome lytic or sclerotic lesion. Sinuses/Orbits: The visualized paranasal sinuses and mastoid air cells are clear. Left globe prosthesis. Other: None. IMPRESSION: 1. No acute intracranial abnormality. 2. Atrophy with chronic small vessel white matter ischemic disease. 3. Age indeterminate lacunar infarct in the right basal ganglia. Electronically Signed   By:  Misty Stanley M.D.   On: 07/26/2020 12:46   DG Chest Port 1 View  Result Date: 07/25/2020 CLINICAL DATA:  75 year old male with shortness of breath and fall in bathroom this morning. Pain. EXAM: PORTABLE CHEST 1 VIEW COMPARISON:  Portable chest 07/12/2020 and earlier. FINDINGS: Portable AP semi upright view at 1152 hours. Stable left chest dual lumen dialysis type catheter. Lung volumes and mediastinal contours are stable. Confluent opacity  persists in the periphery of the right lung abutting the minor fissure. Underlying diffuse diffusely increased pulmonary interstitium, although regressed from earlier this month. No pneumothorax. No definite pleural effusion or new pulmonary opacity. No acute osseous abnormality identified. Paucity of bowel gas in the upper abdomen. IMPRESSION: 1. Persistent confluent opacity in the periphery of the right upper lobe abutting the minor fissure. Pneumonia not excluded. 2. Otherwise dim proved ventilation from earlier this month with regressed interstitial opacity which may have been due to pulmonary edema. 3. No new cardiopulmonary abnormality or acute traumatic injury identified. Electronically Signed   By: Genevie Ann M.D.   On: 07/21/2020 12:01   Medications:  . amiodarone  200 mg Oral Daily  . Chlorhexidine Gluconate Cloth  6 each Topical Q0600  . docusate sodium  100 mg Oral Daily  . ezetimibe  10 mg Oral Daily  . ferrous sulfate  325 mg Oral Daily  . ipratropium-albuterol  3 mL Nebulization Once  . midodrine  10 mg Oral TID WC  . pantoprazole  40 mg Oral Daily  . sevelamer carbonate  1,600 mg Oral TID WC  . sodium zirconium cyclosilicate  10 g Oral BID    Dialysis Orders: [facility] on  TueThuSat, 3 hrs 45 min, 180NRe Optiflux, BFR 400, DFR Autoflow 1.5, EDW 64 (kg), Dialysate 2.0 K, 2.5 Ca RUE AVG/ using L chest TDC Heparin -none hectorol 3 mcg IV q HD Mircera 134mcg q 2 weeks- last dose 07/16/20 .  Assessment/Plan: 1. Syncope: Per pt, history of recurrent hypotension on HD. Difficult to obtain accurate BP due to limiting vascular options, however BP is primarily low here.Now on midodrine TID.  2.  Shortness of breath: Net 750 cc removed yesterday. SOB resolved but still on O2.  Will attempt dialysis tonight as staffing permits with UF as tolerated.  3. ESRD:  Dialyzes on TTS schedule. K+ elevated again this AM. Will order lokelma ordered and will plan for HD this evening.    4.  Anemia: Hgb 9.1. Not due for ESA yet.  5.  Metabolic bone disease: Corrected calcium at goal, phos significantly elevated. Continue hectorol and binders.   6.  Nutrition:  Renal diet/ fluid restrictions recommended. 7. DNR: Patient has had multiple falls and ED visits over the past year. He does not want heroic measures in case of arrest, DNR ordered.  Dr. Jonnie Finner  spoke w/ his family/ daughter on 1/25 and was told that they were just recently notified that recent CT scan show "cancer spread all over his body" and that the family have been discussing and hoping for a transition to hospice or comfort care, but that they "hadn't told him yet about the cancer". They presume this is likely recurrent bladder cancer. She/ the family believe that he is too weak for aggressive medical care and want to discuss transition to hospice. Patient agrees hospice is in the near future and is planning to talk to his daughter this afternoon. Palliative care consulted.    Anice Paganini, PA-C 29-Jul-2020, 12:11 PM  [facility] Pager: [phone]  Patient has FTT w/ progressive gen'd weakness and debility, falling multiple times at home.  Talks are in progress about transition to comfort care. He is a poor candidate for any further outpatient HD, have d/w patient and family.  [facility] 2020/08/22, 1:45 PM

## 2020-07-30 NOTE — Progress Notes (Signed)
Patient placement called and said she placed a patient to be admitted to our floor,the time placed was 23:57,the ED nurse called for report at 23:59,explained to ED nurse we just got the patient placed and we need to approve and read the notes about the patient and asked for her number so we can call her  back for report ED nurse verbalized understanding and left her number.

## 2020-07-30 NOTE — Death Summary Note (Addendum)
DEATH SUMMARY   Patient Details  Name: [name]. MRN: [id number] DOB: June 10, 1946  Admission/Discharge Information   Admit Date:  29-Jul-2020  Date of Death:  July 30, 2020  Time of Death:  15:17  Length of Stay: 0  Referring Physician: [name], MD   Reason(s) for Hospitalization  Loss of Consciousness  Diagnoses  Preliminary cause of death:    Acute Cardiopulmonary Arrest in the setting of likely STEMI complicated by Hyperkalemia and Acute on Chronic Diastolic CHF in the setting of ESRD and Acute Volume Overload progressing to End Organ Damage and Liver Failure with possible concomitant Infection and Metastatic Disease including Right Lung Base Lung Nodules and Liver Masses from Suspected Bladder Carcinoma  Secondary Diagnoses (including complications and co-morbidities):  Principal Problem:   Syncope Active Problems:   Hyperlipidemia   TOBACCO ABUSE   GERD (gastroesophageal reflux disease)   ESRD on dialysis [facility])   Atrial fibrillation ([name])   Volume overload   Leukocytosis   Acute on chronic diastolic CHF (congestive heart failure) ([place])  Syncope:  Single episode of syncope that occurred between 5 and 6 AM on 05/23/2021 and associated with prodrome after rising from seated position following urination.   -Differential includes orthostatic hypotension in the setting of potential intravascular depletion due to recent decline in oral intake, although it appears to be total body water long, as further described below, versus contribution from micturition syncope given and timing of episode immediately following episode urination.   -Differential also includes an element of autonomic dysfunction in the setting of a history of diabetes, although the latter is noted to be well controlled, as further described below.  Ventricular arrhythmia appears less likely given features of prodrome, as described above.  Not associate with any overt acute focal neurologic deficits.    -Clinically, acute ischemic stroke versus seizure appears less likely at this time.  Of note, noncontrast CT that performed today showed no evidence of acute intracranial process, including no evidence of intracranial hemorrhage.  Initial Presentation appears less suggestive of ACS, with presenting EKG showing no evidence of acute ischemic changes, while patient denies any recent chest discomfort however prior to his Death he did have some Chest Discomfort and Dyspnea. Initial Troponin was 1211 and Cardiology was consulted. Right prior to his Death Telemetry called and stated he was having STE Elevations up to 4.1. He was to be administered Nitro and SL Dilaudid but started brayding down and after discussion with the daughter that his prognosis was grim, GOC shifted towards comfort -He Following hemodialysis that is anticipated to occur this evening, will check orthostatic vital signs.  -Dr. Velia Meyer had placed a nursing communication order requesting that orthostatic vital signs x 1 set be checked and documented. Monitor on telemetry.  Close monitoring of ensuing blood pressure via routine vital signsMonitor strict I's and O's.  Check CMP, CBC, serum magnesium level in the morning. Fall precautions ordered.  Acute volume overload: In the setting of inability to complete full hemodialysis session over the last four scheduled sessions due to development of hypotension during each of these dialysis sessions, the patient reports 7 to 10 days of progressive shortness of breath associated with orthopnea and worsening of peripheral edema, concerning for acute volume overload in the setting of the above recent incomplete HD sessions.   -This is further complicated by history of chronic diastolic heart failure, as further described below.  Case was discussed with the on-call nephrologist, who will formally consult, and plans to perform  hemodialysis this evening, as further described above.  Of note, EKG shows no  evidence of acute ischemic changes, including no evidence of ST elevation initially but as above started having elevation on Central Telemetry Monitoring .  -Nephrology formally consulted, and he was dialyzed yesterday evening.  Monitored strict I's and O's and daily weights.  Follow-uped result of CMP.  Monitored continuous pulse oximetry.  Monitored on telemetry.  Check serum magnesium level.  End-Stage Renal Disease Metabolic Acidosis  Hyperkalemia Hyperphosphatemia -On hemodialysis on Tuesday, Thursday, Saturday schedule.  Unable to complete full hemodialysis session over the last four HD sessions due to concomitant hypotension, as further described above.  Nephrology formally consulted, with performed HD yesterday evening in the setting of evidence of volume overload, as above.  Of note, unable to obtain CMP in the ED after multiple attempts by the phlebotomist were unsuccessful with history of PVD.  Per nephrology, CMP to be drawn at the time of hemodialysis this evening. -Patient's BUN/Cr went from 104/9.66 -> 64/6.80 -K+ Went from 6.8 -> 6.4 -Patient's Phos Level was 12.0 -Monitored for results of CMP, as above.  Nephrology formally consulted,and was dialyzed yesterday -Monitored strict I's and O's and daily weights. Continued home Renvela and was given Lokelma for K+  -Nephrology felt that Outpatient Dialysis would not be beneficial so recommended transition to comfort -They were planning for Dialysis tonight but patient started actively dying so was transitioned to comfort   Abnormal LFTs; Acute Liver Failure Hyperbilirubinemia  -LFTs acutely worsened and AST went from 208 -> 1772 and ALT went from 152 -> 731 -Patient's T Bili went from 2.9 -> 3.3 -Likely from Hypoperfusion and Volume Overload with Passive Venous Congestion  -Likely went into Liver Failure -Transitioned to Comfort right before he passed  Anemia of Chronic Kidney Disease -Patient's hemoglobin/hematocrit went from  8.9/28.7 and is now 9.1/29.9 -Wanted to Continue to Monitor and Trend but he passed   Acute on Chronic Diastolic Heart Failure -With most recent echocardiogram performed in March 2021 showing grade 2 diastolic dysfunction, with additional echocardiogram findings, as further described above.  Clinically, there appears to be an element of acute on chronic diastolic heart failure given the patient's report of recent, progressive shortness of breath associated with orthopnea and progression of edema in the bilateral lower extremities.  This appears to be the basis of acute volume overload, with suspected contribution from incomplete recent hemodialysis sessions, as further described above. -In the setting of not producing any urine at baseline, nephrology has been consulted, with plan for hemodialysis session yesterday  Leukocytosis with ? Infection -Worsening -WBC went from 14.2 -> 20.5 but he was afebrile and could have been in the setting of Metastatic Disease -His procalcitonin level was 16.40 -Not started on any antibiotics but was going to be placed on if his WBC continue to worsen  Metastatic Bladder Carcinoma -Recent CT Abd/Pelvis with Contrast done showed "1. Findings of metastatic disease including pleural based right lung base nodules and numerous ill-defined hypoattenuating liver masses. There is also an enlarged lymph node just above the esophageal hiatus, to the right of the distal esophagus in the posteroinferior right mediastinum. These findings may be from the reported history of bladder carcinoma. 2. No acute findings within the abdomen or pelvis. 3. Large right inguinal hernia containing small bowel with no evidence of bowel obstruction, incarceration or strangulation. 4. Chronically occluded infrarenal abdominal aorta. Patient has a left axillary femoral bypass graft and a by femoral bypass graft. Flow to  the lower extremities is mostly through collaterals, however." -Family was open  to Hospice Discussion and Palliative Care was consulted but he decompensated as expected and was transitioned to Comfort prior to him passing away   Goals of Care -He was made DNR last night after discussion with nephrology and right before he passed he wanted to be comfortable and so comfort measures were enacted and fortunately did not have an IV so he was given sublingual Dilaudid.  Palliative care was consulted for goals of care discussion they were to see the patient however patient passed away prior to them evaluating and his death is not unexpected given his high risk of decompensation and poor prognosis.  Brief Hospital Course (including significant findings, care, treatment, and services provided and events leading to death)  [name]. is a 75 y.o. male with medical history significant for coronary artery disease, chronic  diastolic heart failure, end-stage renal disease on hemodialysis on Tuesday, Thursday, Saturday schedule, paroxysmal atrial fibrillation, hyperlipidemia, type 2 diabetes mellitus controlled via lifestyle modifications with most recent hemoglobin A1c 5.1% in January 2022, chronic hypoxic respiratory failure on 4 L continuous nasal cannula, peripheral artery disease with prior total occlusion of abdominal aorta status post axiallobifemoral graft , who is admitted to [facility] on 07/15/2020 for further evaluation management of syncope as well as acute volume overload after presenting from home to [facility] emergency department complaining of a single episode of loss of consciousness.   The patient reports a single episode of loss of consciousness that occurred between 5 AM and 6 AM on 07/13/2020.  Reports that this episode occurred as he was rising from a seated location after urinating, at which time he developed lightheadedness and dizziness followed by loss of consciousness.  He feels that he lost consciousness before falling to the floor, and suspects that he  hit his head on the bathroom floor as a consequence of this episode.  He is unsure as to the specific duration during which she was unconscious, but suspects that it was only a matter of 1 to 2 minutes or less.  However, the patient reports that he was not near a phone at the time of this episode, and reports that it took him 3 to 4 hours following this episode of loss of consciousness before he was able to reach a phone in order to contact EMS.  Denies any preceding or subsequent chest pain or palpitations.  Denies any residual headache, acute focal weakness, acute focal paresthesias, numbness,, dizziness, lightheadedness, vertigo, or acute change in vision.  The patient also reports progressive shortness of breath over the last week associated with orthopnea as well as progression of edema in the bilateral lower extremities.  Denies any associated cough, subjective fever, chills, rigors, or generalized myalgias over that time.  He reports that each of his last four hemodialysis sessions were cut short due to development of hypotension partway through the session.  Denies any recent melena or hematochezia.  He reports recent decline in his oral intake in the setting of diminished appetite, but denies any associated nausea, vomiting, or diarrhea.  Denies any recent neck stiffness, rhinorrhea, rhinitis, sore throat, or rash.  No recent traveling or known COVID-19 exposures.  He conveys that he does not produce urine in the setting of his end-stage renal disease.   Medical history is notable for history of paroxysmal atrial fibrillation for which she is on daily amiodarone.  He reports that he was previously on chronic  anticoagulation, but reports that this was recently discontinued in the setting of recurrent falls and associated concern for bleeding risk.  Denies any recent calf tenderness or worsening lower extremity erythema.  Also denies any recent hemoptysis.  He also has a history of chronic  diastolic heart failure, with most recent echocardiogram performed in March 2021 showing LVEF 50 to 55%, mild LVH, grade 2 diastolic dysfunction, and moderate mitral regurgitation.     ED Course:  Vital signs in the ED were notable for the following: Temperature max 98.4; heart rate 66-86; blood pressure 115/45 to 134/48; respiratory rate 21-25; oxygen saturation 93 to 94% on baseline 4 L nasal cannula.  Labs were notable for the following: Phlebotomy attempted to draw CMP although they were unsuccessful in doing so in the setting of patient's PVD; they were however able to get sufficient blood specimen for CBC, which was notable for white blood cell count of 14,000, hemoglobin 8.9.  Additionally, point-of-care glucose 76.  Case was discussed with the on-call nephrologist, who agreed to formal consultation, with plan for hemodialysis tonight (07/17/2020), at which time additional blood draw of CMP and evaluation of such will occur.   Chest x-ray, relative to most recent prior chest x-ray from 07/12/2020 showed no interval acute cardiopulmonary process.  CT of the head showed no evidence of acute intracranial process, including no evidence of intracranial hemorrhage.  EKG, by way of comparison to most recent prior EKG performed on 07/15/2020, showed sinus rhythm with heart rate 64, QTc 477 MS, nonspecific to inversion in aVL, no evidence of ST changes, including no evidence of ST elevation, and potential Q-wave in aVL.  Screening COVID-19 testing performed in the ED today was found to be negative.  Subsequently, the patient was admitted to the med telemetry floor for further evaluation and management of presenting syncopal episode as well as for further evaluation management suspected presenting acute volume overload.   **Interim History  Patient overnight became hypotensive after his dialysis session and only had 1.5 L removed.  He was seen and examined by the night team and is ordered a 750  mL bolus of IV normal saline with improvement.  He became mildly restless with dyspnea and wheezing with some coughing and ability to expectorate.  Will order duo nebs and Mucinex with improvement of his breathing.  He remained on 4 L supplemental oxygen via nasal And subsequently continued to feel worse short of breath this morning.  This morning his potassium was 6.4 and so he was initiated on given Lokelma.  He subsequently continued to worsen and decompensate and nephrology was going to plan to dialyze him again.  Goals of care discussions were continued to be had and he was made DNR last night.  He was expected to be transitioned to comfort however further decompensated and worsened.  Central telemetry called saying that he had some ST elevations and he had some chest discomfort..  Patient felt smothered and then became bradycardic and subsequently lost his pulse and became very apneic. His passing was not unexpected given his high risk of decompensation and worsening clinical status throughout the day.  Family was notified and patient was transitioned to full comfort measures and he passed peacefully at 17 and his daughter was notified by prior to his passing.   Pertinent Labs and Studies  Significant Diagnostic Studies CT HEAD WO CONTRAST  Result Date: 07/12/2020 CLINICAL DATA:  Fall.  Loss of consciousness. EXAM: CT HEAD WITHOUT CONTRAST TECHNIQUE: Contiguous axial images were  obtained from the base of the skull through the vertex without intravenous contrast. COMPARISON:  No comparison studies available. FINDINGS: Brain: There is no evidence for acute hemorrhage, hydrocephalus, mass lesion, or abnormal extra-axial fluid collection. No definite CT evidence for acute infarction. Diffuse loss of parenchymal volume is consistent with atrophy. Patchy low attenuation in the deep hemispheric and periventricular white matter is nonspecific, but likely reflects chronic microvascular ischemic demyelination.  Age indeterminate lacunar infarct noted right basal ganglia. Vascular: No hyperdense vessel or unexpected calcification. Skull: No evidence for fracture. No worrisome lytic or sclerotic lesion. Sinuses/Orbits: The visualized paranasal sinuses and mastoid air cells are clear. Left globe prosthesis. Other: None. IMPRESSION: 1. No acute intracranial abnormality. 2. Atrophy with chronic small vessel white matter ischemic disease. 3. Age indeterminate lacunar infarct in the right basal ganglia. Electronically Signed   By: Misty Stanley M.D.   On: 07/08/2020 12:46   CT ABDOMEN PELVIS W CONTRAST  Result Date: 07/13/2020 CLINICAL DATA:  Given indication is hernia, complicated. Patient's history reports bladder carcinoma. EXAM: CT ABDOMEN AND PELVIS WITH CONTRAST CT LUMBAR SPINE WITHOUT CONTRAST. TECHNIQUE: Multidetector CT imaging of the abdomen and pelvis was performed using the standard protocol following bolus administration of intravenous contrast. Images of the lumbar spine with or derive from the abdomen and pelvis data set with coronal and sagittal reformatted images also acquired. CONTRAST:  86mL OMNIPAQUE IOHEXOL 300 MG/ML  SOLN COMPARISON:  None. FINDINGS: Lower chest: Multiple pleural based nodules on the right, largest, which is somewhat ill-defined, measures 1.9 cm. There are additional changes of centrilobular emphysema, with areas of interstitial thickening. No acute findings. Mass consistent with an enlarged lymph node lies to the right of the distal esophagus measuring 2.2 cm in short axis. Hepatobiliary: Numerous small poorly defined hypoattenuating liver masses. Largest discrete mass lies in the left lobe, 2.1 cm in size. Masses lead to nodularity of the liver contour. Liver is normal in overall size. Normal gallbladder. No bile duct dilation. Pancreas: Unremarkable. No pancreatic ductal dilatation or surrounding inflammatory changes. Spleen: Normal in size without focal abnormality. Adrenals/Urinary  Tract: No adrenal masses. Bilateral renal cortical thinning. 7 mm low-attenuation mass, midpole the left kidney, most likely a cyst. There is delayed renal enhancement bilaterally. Several intrarenal vascular calcifications. No convincing collecting system stones. No hydronephrosis. Normal ureters. Bladder decompressed. Stomach/Bowel: Large right inguinal hernia contains a loop of small bowel. No evidence of incarceration or strangulation and no bowel obstruction. Normal stomach. Small bowel is normal in caliber. No wall thickening or inflammation. Colon is normal in caliber. Numerous colonic diverticula. Mild generalized increase in colonic stool burden. There is a colon anastomosis staple line at the level of the junction of the descending and proximal sigmoid colon in the left pelvis. There is no colonic wall thickening. No inflammation. Vascular/Lymphatic: Extensive aortic atherosclerosis. Aorta is thrombosed/occluded beginning just below the left renal artery. More distally the aorta is small irregular densely calcified. There is enhancement of the distal common iliac arteries and the external and internal iliac arteries. Patient has a left axillary femoral and a by femoral artery graft. No enlarged retroperitoneal, mesenteric or pelvic lymph nodes. Reproductive: Prostate mildly enlarged, 3.9 x 3.3 cm. Other: No ascites. Musculoskeletal: No acute fracture.  No bone lesion. LUMBAR SPINE CT Alignment: Mild dextroscoliosis, apex at L2-L3. Grade 1 anterolisthesis, which is a spondylo lysis, of L5 on S1 due to bilateral chronic pars defects. Slight retrolisthesis of L4 on L5 and L1 on L2. Osseous structures: Diffusely  demineralized. No fracture. No osteoblastic or osteolytic lesions. Disc levels: T11-T12 and T12-L1. Moderate to marked loss of disc height. Mild diffuse disc bulging. No disc herniations. L1-L2: Marked loss of disc height with endplate spurring and sclerosis. Diffuse disc bulging. Moderate central  stenosis with significant narrowing of the superolateral recesses. Moderate to severe bilateral neural foraminal narrowing. L2-L3: Marked loss of disc height. Diffuse disc bulging with endplate spurring. No convincing disc herniation. Ligamentum flavum enlargement. Moderate central stenosis and superolateral recess narrowing with moderate to severe left and moderate right neural foraminal narrowing. L3-L4: Marked loss of disc height. Diffuse spondylotic disc bulging with endplate spurring. No convincing disc herniation. Mild facet degenerative changes. Mild central stenosis with right greater than left superolateral recess narrowing and moderate bilateral neural foraminal narrowing. L4-L5: Moderate loss of disc height. Diffuse disc bulging. No convincing disc herniation. Bilateral facet degenerative change. Moderate bilateral neural foraminal narrowing. L5-S1: Moderate to marked loss of disc height. Facet productive changes. The bilateral pars defects combine with disc degenerative changes with endplate spurring, particularly on the right, to cause severe right and moderate left neural foraminal narrowing. Surrounding soft tissues. Scattered calcifications noted in normal size psoas muscles. Occluded infrarenal abdominal aorta, small and densely calcified. No acute soft tissue abnormality. IMPRESSION: CT ABDOMEN AND PELVIS 1. Findings of metastatic disease including pleural based right lung base nodules and numerous ill-defined hypoattenuating liver masses. There is also an enlarged lymph node just above the esophageal hiatus, to the right of the distal esophagus in the posteroinferior right mediastinum. These findings may be from the reported history of bladder carcinoma. 2. No acute findings within the abdomen or pelvis. 3. Large right inguinal hernia containing small bowel with no evidence of bowel obstruction, incarceration or strangulation. 4. Chronically occluded infrarenal abdominal aorta. Patient has a left  axillary femoral bypass graft and a by femoral bypass graft. Flow to the lower extremities is mostly through collaterals, however. LUMBAR SPINE CT 1. No fracture or acute finding. No osteoblastic or osteolytic bone lesions. 2. Chronic bilateral pars defects at L5-S1 with a grade 1 anterolisthesis. 3. Diffuse and extensive disc and facet degenerative changes with varying degrees of tricompartmental stenosis. Electronically Signed   By: Lajean Manes M.D.   On: 07/13/2020 14:45   CT L-SPINE NO CHARGE  Result Date: 07/13/2020 CLINICAL DATA:  Given indication is hernia, complicated. Patient's history reports bladder carcinoma. EXAM: CT ABDOMEN AND PELVIS WITH CONTRAST CT LUMBAR SPINE WITHOUT CONTRAST. TECHNIQUE: Multidetector CT imaging of the abdomen and pelvis was performed using the standard protocol following bolus administration of intravenous contrast. Images of the lumbar spine with or derive from the abdomen and pelvis data set with coronal and sagittal reformatted images also acquired. CONTRAST:  67mL OMNIPAQUE IOHEXOL 300 MG/ML  SOLN COMPARISON:  None. FINDINGS: Lower chest: Multiple pleural based nodules on the right, largest, which is somewhat ill-defined, measures 1.9 cm. There are additional changes of centrilobular emphysema, with areas of interstitial thickening. No acute findings. Mass consistent with an enlarged lymph node lies to the right of the distal esophagus measuring 2.2 cm in short axis. Hepatobiliary: Numerous small poorly defined hypoattenuating liver masses. Largest discrete mass lies in the left lobe, 2.1 cm in size. Masses lead to nodularity of the liver contour. Liver is normal in overall size. Normal gallbladder. No bile duct dilation. Pancreas: Unremarkable. No pancreatic ductal dilatation or surrounding inflammatory changes. Spleen: Normal in size without focal abnormality. Adrenals/Urinary Tract: No adrenal masses. Bilateral renal cortical thinning. 7  mm low-attenuation mass,  midpole the left kidney, most likely a cyst. There is delayed renal enhancement bilaterally. Several intrarenal vascular calcifications. No convincing collecting system stones. No hydronephrosis. Normal ureters. Bladder decompressed. Stomach/Bowel: Large right inguinal hernia contains a loop of small bowel. No evidence of incarceration or strangulation and no bowel obstruction. Normal stomach. Small bowel is normal in caliber. No wall thickening or inflammation. Colon is normal in caliber. Numerous colonic diverticula. Mild generalized increase in colonic stool burden. There is a colon anastomosis staple line at the level of the junction of the descending and proximal sigmoid colon in the left pelvis. There is no colonic wall thickening. No inflammation. Vascular/Lymphatic: Extensive aortic atherosclerosis. Aorta is thrombosed/occluded beginning just below the left renal artery. More distally the aorta is small irregular densely calcified. There is enhancement of the distal common iliac arteries and the external and internal iliac arteries. Patient has a left axillary femoral and a by femoral artery graft. No enlarged retroperitoneal, mesenteric or pelvic lymph nodes. Reproductive: Prostate mildly enlarged, 3.9 x 3.3 cm. Other: No ascites. Musculoskeletal: No acute fracture.  No bone lesion. LUMBAR SPINE CT Alignment: Mild dextroscoliosis, apex at L2-L3. Grade 1 anterolisthesis, which is a spondylo lysis, of L5 on S1 due to bilateral chronic pars defects. Slight retrolisthesis of L4 on L5 and L1 on L2. Osseous structures: Diffusely demineralized. No fracture. No osteoblastic or osteolytic lesions. Disc levels: T11-T12 and T12-L1. Moderate to marked loss of disc height. Mild diffuse disc bulging. No disc herniations. L1-L2: Marked loss of disc height with endplate spurring and sclerosis. Diffuse disc bulging. Moderate central stenosis with significant narrowing of the superolateral recesses. Moderate to severe  bilateral neural foraminal narrowing. L2-L3: Marked loss of disc height. Diffuse disc bulging with endplate spurring. No convincing disc herniation. Ligamentum flavum enlargement. Moderate central stenosis and superolateral recess narrowing with moderate to severe left and moderate right neural foraminal narrowing. L3-L4: Marked loss of disc height. Diffuse spondylotic disc bulging with endplate spurring. No convincing disc herniation. Mild facet degenerative changes. Mild central stenosis with right greater than left superolateral recess narrowing and moderate bilateral neural foraminal narrowing. L4-L5: Moderate loss of disc height. Diffuse disc bulging. No convincing disc herniation. Bilateral facet degenerative change. Moderate bilateral neural foraminal narrowing. L5-S1: Moderate to marked loss of disc height. Facet productive changes. The bilateral pars defects combine with disc degenerative changes with endplate spurring, particularly on the right, to cause severe right and moderate left neural foraminal narrowing. Surrounding soft tissues. Scattered calcifications noted in normal size psoas muscles. Occluded infrarenal abdominal aorta, small and densely calcified. No acute soft tissue abnormality. IMPRESSION: CT ABDOMEN AND PELVIS 1. Findings of metastatic disease including pleural based right lung base nodules and numerous ill-defined hypoattenuating liver masses. There is also an enlarged lymph node just above the esophageal hiatus, to the right of the distal esophagus in the posteroinferior right mediastinum. These findings may be from the reported history of bladder carcinoma. 2. No acute findings within the abdomen or pelvis. 3. Large right inguinal hernia containing small bowel with no evidence of bowel obstruction, incarceration or strangulation. 4. Chronically occluded infrarenal abdominal aorta. Patient has a left axillary femoral bypass graft and a by femoral bypass graft. Flow to the lower  extremities is mostly through collaterals, however. LUMBAR SPINE CT 1. No fracture or acute finding. No osteoblastic or osteolytic bone lesions. 2. Chronic bilateral pars defects at L5-S1 with a grade 1 anterolisthesis. 3. Diffuse and extensive disc and facet degenerative changes  with varying degrees of tricompartmental stenosis. Electronically Signed   By: Lajean Manes M.D.   On: 07/13/2020 14:45   DG Chest Port 1 View  Result Date: 07/26/2020 CLINICAL DATA:  75 year old male with shortness of breath and fall in bathroom this morning. Pain. EXAM: PORTABLE CHEST 1 VIEW COMPARISON:  Portable chest 07/12/2020 and earlier. FINDINGS: Portable AP semi upright view at 1152 hours. Stable left chest dual lumen dialysis type catheter. Lung volumes and mediastinal contours are stable. Confluent opacity persists in the periphery of the right lung abutting the minor fissure. Underlying diffuse diffusely increased pulmonary interstitium, although regressed from earlier this month. No pneumothorax. No definite pleural effusion or new pulmonary opacity. No acute osseous abnormality identified. Paucity of bowel gas in the upper abdomen. IMPRESSION: 1. Persistent confluent opacity in the periphery of the right upper lobe abutting the minor fissure. Pneumonia not excluded. 2. Otherwise dim proved ventilation from earlier this month with regressed interstitial opacity which may have been due to pulmonary edema. 3. No new cardiopulmonary abnormality or acute traumatic injury identified. Electronically Signed   By: Genevie Ann M.D.   On: 06/29/2020 12:01   DG Chest Port 1 View  Result Date: 07/12/2020 CLINICAL DATA:  Dialysis catheter placement EXAM: PORTABLE CHEST 1 VIEW COMPARISON:  02/24/2020 chest radiograph. FINDINGS: Left internal jugular central venous catheter terminates in the lower third of the SVC. Stable cardiomediastinal silhouette with top-normal heart size. No pneumothorax. No pleural effusion. Patchy hazy and  reticular opacities throughout peripheral right greater than left lungs, similar to mildly worsened. IMPRESSION: Left internal jugular central venous catheter terminates in the lower third of the SVC. No pneumothorax. Chronic patchy hazy and reticular opacities throughout the peripheral right greater than left lungs, similar to mildly worsened, favoring interstitial lung disease with superimposed mild pulmonary edema or atypical infection not excluded. Electronically Signed   By: Ilona Sorrel M.D.   On: 07/12/2020 15:24   DG Shoulder Left  Result Date: 07/12/2020 CLINICAL DATA:  [place] this morning landing on left shoulder. Limited range of motion. EXAM: LEFT SHOULDER - 2+ VIEW COMPARISON:  None. FINDINGS: There is no evidence of fracture or dislocation. Loss of subacromial space suspicious for rotator cuff tear. Soft tissues are unremarkable. Dialysis catheter partially visualized. IMPRESSION: 1. No fracture or dislocation. 2. Loss of subacromial space suspicious for rotator cuff tear of uncertain chronicity. Electronically Signed   By: Miachel Roux M.D.   On: 07/12/2020 10:35   DG Fluoro Guide CV Line-No Report  Result Date: 07/12/2020 Fluoroscopy was utilized by the requesting physician.  No radiographic interpretation.   VAS [place] UPPER EXTREMITY ARTERIAL DUPLEX  Result Date: 07/10/2020 UPPER EXTREMITY DUPLEX STUDY Indications: Patient complains of New AVF placement.  Other Factors: Failed left brachiocephalic AVF Performing Technologist: June Leap RDMS, RVT  Examination Guidelines: A complete evaluation includes B-mode imaging, spectral Doppler, color Doppler, and power Doppler as needed of all accessible portions of each vessel. Bilateral testing is considered an integral part of a complete examination. Limited examinations for reoccurring indications may be performed as noted.  Right Doppler Findings: +-------------+----------+--------+--------+--------+ Site         PSV  (cm/s)WaveformStenosisComments +-------------+----------+--------+--------+--------+ Brachial Dist67                                 +-------------+----------+--------+--------+--------+   Right Pre-Dialysis Findings: +-----------------------+----------+--------------------+--------+--------+ Location  PSV (cm/s)Intralum. Diam. (cm)WaveformComments +-----------------------+----------+--------------------+--------+--------+ Brachial Antecub. fossa60        0.60                biphasic         +-----------------------+----------+--------------------+--------+--------+ Radial Art at Wrist    130       0.19                biphasic         +-----------------------+----------+--------------------+--------+--------+ Ulnar Art at Wrist     34        0.19                biphasic         +-----------------------+----------+--------------------+--------+--------+   Summary:  Right: No obstruction visualized in the right upper extremity. *See table(s) above for measurements and observations. Electronically signed by [name] MD on 07/10/2020 at 1:02:25 PM.    Final    XR Cervical Spine 2 or 3 views  Result Date: 07/01/2020 Two view radiographs of the cervical spine shows advanced destructive arthritic changes with extensive collapse of all the disc spaces throughout the cervical spine.  VAS [place] UPPER EXTREMITY VEIN MAPPING  Result Date: 07/10/2020 UPPER EXTREMITY VEIN MAPPING  Indications: New AVF placement. History: Failed left brachiocephalic fistula.  Performing Technologist: June Leap RDMS, RVT  Examination Guidelines: A complete evaluation includes B-mode imaging, spectral Doppler, color Doppler, and power Doppler as needed of all accessible portions of each vessel. Bilateral testing is considered an integral part of a complete examination. Limited examinations for reoccurring indications may be performed as noted.  +-----------------+-------------+----------+---------+ Right Cephalic   Diameter (cm)Depth (cm)Findings  +-----------------+-------------+----------+---------+ Shoulder             0.21                         +-----------------+-------------+----------+---------+ Mid upper arm        0.21                         +-----------------+-------------+----------+---------+ Dist upper arm       0.18                         +-----------------+-------------+----------+---------+ Antecubital fossa    0.25                         +-----------------+-------------+----------+---------+ Prox forearm         0.13               branching +-----------------+-------------+----------+---------+ Mid forearm          0.20                         +-----------------+-------------+----------+---------+ Wrist                0.18                         +-----------------+-------------+----------+---------+ +-----------------+-------------+----------+---------+ Right Basilic    Diameter (cm)Depth (cm)Findings  +-----------------+-------------+----------+---------+ Mid upper arm        0.18                         +-----------------+-------------+----------+---------+ Dist upper arm       0.19                         +-----------------+-------------+----------+---------+  Antecubital fossa    0.21                         +-----------------+-------------+----------+---------+ Prox forearm         0.19               branching +-----------------+-------------+----------+---------+ *See table(s) above for measurements and observations.  Diagnosing physician: [name] MD Electronically signed by [name] MD on 07/10/2020 at 1:01:39 PM.    Final     Microbiology Recent Results (from the past 240 hour(s))  SARS Coronavirus 2 by RT PCR (hospital order, performed in [facility] hospital lab) Nasopharyngeal Nasopharyngeal Swab     Status: None   Collection Time:  07/12/2020 11:40 AM   Specimen: Nasopharyngeal Swab  Result Value Ref Range Status   SARS Coronavirus 2 NEGATIVE NEGATIVE Final    Comment: (NOTE) SARS-CoV-2 target nucleic acids are NOT DETECTED.  The SARS-CoV-2 RNA is generally detectable in upper and lower respiratory specimens during the acute phase of infection. The lowest concentration of SARS-CoV-2 viral copies this assay can detect is 250 copies / mL. A negative result does not preclude SARS-CoV-2 infection and should not be used as the sole basis for treatment or other patient management decisions.  A negative result may occur with improper specimen collection / handling, submission of specimen other than nasopharyngeal swab, presence of viral mutation(s) within the areas targeted by this assay, and inadequate number of viral copies (<250 copies / mL). A negative result must be combined with clinical observations, patient history, and epidemiological information.  Fact Sheet for Patients:   StrictlyIdeas.no  Fact Sheet for Healthcare Providers: BankingDealers.co.za  This test is not yet approved or  cleared by the [place] FDA and has been authorized for detection and/or diagnosis of SARS-CoV-2 by FDA under an Emergency Use Authorization (EUA).  This EUA will remain in effect (meaning this test can be used) for the duration of the COVID-19 declaration under Section 564(b)(1) of the Act, 21 U.S.C. section 360bbb-3(b)(1), unless the authorization is terminated or revoked sooner.  Performed at [facility], [place] [address] [place], [place] [address]    CONSULTS Cardiology Nephrology Palliative Care  DEATH EXAM -Pupil: Fixed and Dilated and Non-Reactive -Cardiovascular: No Auscultated Heart Sounds for >1 min of listening; Radial and Pedal Pulses not auscultated  -Lungs: No spontaneous breathing; No Auscultated breath sounds -Extremities: Cool and Dry to the  touch  Lab Basic Metabolic Panel: Recent Labs  Lab 07/02/2020 1927 07/28/20 0608  NA 138 142  K 6.8* 6.4*  CL 98 101  CO2 15* 14*  GLUCOSE 76 49*  BUN 104* 64*  CREATININE 9.66* 6.80*  CALCIUM 7.8* 7.9*  MG  --  2.4  PHOS  --  12.0*   Liver Function Tests: Recent Labs  Lab 07/03/2020 1927 Jul 28, 2020 0608  AST 208* 1,772*  ALT 152* 731*  ALKPHOS 404* 343*  BILITOT 2.9* 3.3*  PROT 5.9* 5.5*  ALBUMIN 2.3* 2.2*   No results for input(s): LIPASE, AMYLASE in the last 168 hours. No results for input(s): AMMONIA in the last 168 hours. CBC: Recent Labs  Lab 07/17/2020 1400 July 28, 2020 0608  WBC 14.2* 20.5*  NEUTROABS  --  14.2*  HGB 8.9* 9.1*  HCT 28.7* 29.9*  MCV 88.6 89.8  PLT PLATELET CLUMPS NOTED ON SMEAR, UNABLE TO ESTIMATE PLATELET CLUMPS NOTED ON SMEAR, UNABLE TO ESTIMATE   Cardiac Enzymes: No results for input(s): CKTOTAL, CKMB,  CKMBINDEX, TROPONINI in the last 168 hours. Sepsis Labs: Recent Labs  Lab 07/17/2020 1400 Aug 15, 2020 0608  PROCALCITON  --  16.40  WBC 14.2* 20.5*    Procedures/Operations  Hemodialysis   Raiford Noble, DO 08/15/20, 3:24 PM

## 2020-07-30 NOTE — Progress Notes (Signed)
   08/07/2020 0200  Assess: MEWS Score  Temp 98.6 F (37 C)  BP (!) 70/40  Pulse Rate 80  Resp (!) 28  Level of Consciousness Alert  SpO2 98 %  O2 Device Nasal Cannula  O2 Flow Rate (L/min) 4 L/min  Assess: MEWS Score  MEWS Temp 0  MEWS Systolic 2  MEWS Pulse 0  MEWS RR 2  MEWS LOC 0  MEWS Score 4  MEWS Score Color Red  Assess: if the MEWS score is Yellow or Red  Were vital signs taken at a resting state? Yes  Focused Assessment Change from prior assessment (see assessment flowsheet)  Early Detection of Sepsis Score *See Row Information* Low  Treat  MEWS Interventions Escalated (See documentation below);Administered prn meds/treatments  Pain Scale 0-10  Pain Score 3  Take Vital Signs  Increase Vital Sign Frequency  Red: Q 1hr X 4 then Q 4hr X 4, if remains red, continue Q 4hrs  Escalate  MEWS: Escalate Red: discuss with charge nurse/RN and provider, consider discussing with RRT  Notify: Charge Nurse/RN  Name of Charge Nurse/RN Notified Cheryl, RN  Date Charge Nurse/RN Notified 2020/08/07  Time Charge Nurse/RN Notified 0210  Notify: Provider  Provider Name/Title Sidney Ace, MD  Date Provider Notified 08-07-20  Time Provider Notified 0210  Notification Type Page  Notification Reason Change in status  Response See new orders  Date of Provider Response 08-07-20  Document  Patient Outcome Not stable and remains on department  Progress note created (see row info) Yes

## 2020-07-30 NOTE — Telephone Encounter (Signed)
Pt daughter called stating that her dad fell and has been admitted to the hospital wanting to let Dr Dennard Schaumann know

## 2020-07-30 NOTE — Progress Notes (Signed)
Paged provider regarding patient pain level

## 2020-07-30 DEATH — deceased

## 2020-08-09 DIAGNOSIS — J9621 Acute and chronic respiratory failure with hypoxia: Secondary | ICD-10-CM | POA: Diagnosis not present

## 2020-08-09 DIAGNOSIS — I509 Heart failure, unspecified: Secondary | ICD-10-CM | POA: Diagnosis not present

## 2020-08-09 DIAGNOSIS — J449 Chronic obstructive pulmonary disease, unspecified: Secondary | ICD-10-CM | POA: Diagnosis not present

## 2020-08-09 DIAGNOSIS — J849 Interstitial pulmonary disease, unspecified: Secondary | ICD-10-CM | POA: Diagnosis not present

## 2020-08-09 DIAGNOSIS — I2510 Atherosclerotic heart disease of native coronary artery without angina pectoris: Secondary | ICD-10-CM | POA: Diagnosis not present

## 2020-09-06 ENCOUNTER — Ambulatory Visit: Payer: Medicare HMO | Admitting: Podiatry

## 2020-09-23 ENCOUNTER — Ambulatory Visit: Payer: Medicare HMO | Admitting: Internal Medicine
